# Patient Record
Sex: Female | Born: 1960 | Race: Black or African American | Hispanic: No | Marital: Single | Smoking: Never smoker
Health system: Southern US, Community
[De-identification: ages and names within clinical notes are randomized; demographics above are authoritative.]

## PROBLEM LIST (undated history)

## (undated) ENCOUNTER — Inpatient Hospital Stay: Admission: EM | Payer: Self-pay | Source: Home / Self Care

## (undated) DIAGNOSIS — G8929 Other chronic pain: Secondary | ICD-10-CM

## (undated) DIAGNOSIS — M1990 Unspecified osteoarthritis, unspecified site: Secondary | ICD-10-CM

## (undated) DIAGNOSIS — K5900 Constipation, unspecified: Secondary | ICD-10-CM

## (undated) DIAGNOSIS — I10 Essential (primary) hypertension: Secondary | ICD-10-CM

## (undated) DIAGNOSIS — M2550 Pain in unspecified joint: Secondary | ICD-10-CM

## (undated) DIAGNOSIS — Z8042 Family history of malignant neoplasm of prostate: Secondary | ICD-10-CM

## (undated) DIAGNOSIS — J45909 Unspecified asthma, uncomplicated: Secondary | ICD-10-CM

## (undated) DIAGNOSIS — M4800 Spinal stenosis, site unspecified: Secondary | ICD-10-CM

## (undated) DIAGNOSIS — M5481 Occipital neuralgia: Secondary | ICD-10-CM

## (undated) DIAGNOSIS — E7800 Pure hypercholesterolemia, unspecified: Secondary | ICD-10-CM

## (undated) DIAGNOSIS — G43909 Migraine, unspecified, not intractable, without status migrainosus: Secondary | ICD-10-CM

## (undated) DIAGNOSIS — M549 Dorsalgia, unspecified: Secondary | ICD-10-CM

## (undated) DIAGNOSIS — R519 Headache, unspecified: Secondary | ICD-10-CM

## (undated) DIAGNOSIS — R7303 Prediabetes: Secondary | ICD-10-CM

## (undated) DIAGNOSIS — T796XXA Traumatic ischemia of muscle, initial encounter: Secondary | ICD-10-CM

## (undated) DIAGNOSIS — N289 Disorder of kidney and ureter, unspecified: Secondary | ICD-10-CM

## (undated) DIAGNOSIS — G500 Trigeminal neuralgia: Secondary | ICD-10-CM

## (undated) DIAGNOSIS — Z803 Family history of malignant neoplasm of breast: Secondary | ICD-10-CM

## (undated) DIAGNOSIS — K589 Irritable bowel syndrome without diarrhea: Secondary | ICD-10-CM

## (undated) DIAGNOSIS — N200 Calculus of kidney: Secondary | ICD-10-CM

## (undated) DIAGNOSIS — R600 Localized edema: Secondary | ICD-10-CM

## (undated) DIAGNOSIS — Z87440 Personal history of urinary (tract) infections: Secondary | ICD-10-CM

## (undated) DIAGNOSIS — K219 Gastro-esophageal reflux disease without esophagitis: Secondary | ICD-10-CM

## (undated) DIAGNOSIS — Z87442 Personal history of urinary calculi: Secondary | ICD-10-CM

## (undated) DIAGNOSIS — F909 Attention-deficit hyperactivity disorder, unspecified type: Secondary | ICD-10-CM

## (undated) HISTORY — DX: Personal history of urinary (tract) infections: Z87.440

## (undated) HISTORY — DX: Occipital neuralgia: M54.81

## (undated) HISTORY — DX: Disorder of kidney and ureter, unspecified: N28.9

## (undated) HISTORY — DX: Headache, unspecified: R51.9

## (undated) HISTORY — DX: Migraine, unspecified, not intractable, without status migrainosus: G43.909

## (undated) HISTORY — DX: Localized edema: R60.0

## (undated) HISTORY — DX: Family history of malignant neoplasm of breast: Z80.3

## (undated) HISTORY — DX: Other chronic pain: G89.29

## (undated) HISTORY — DX: Unspecified osteoarthritis, unspecified site: M19.90

## (undated) HISTORY — DX: Family history of malignant neoplasm of prostate: Z80.42

## (undated) HISTORY — DX: Constipation, unspecified: K59.00

## (undated) HISTORY — DX: Pain in unspecified joint: M25.50

## (undated) HISTORY — PX: DILATION AND CURETTAGE OF UTERUS: SHX78

## (undated) HISTORY — DX: Gastro-esophageal reflux disease without esophagitis: K21.9

## (undated) HISTORY — DX: Attention-deficit hyperactivity disorder, unspecified type: F90.9

## (undated) HISTORY — PX: ABDOMINAL HYSTERECTOMY: SHX81

## (undated) HISTORY — PX: APPENDECTOMY: SHX54

## (undated) HISTORY — DX: Pure hypercholesterolemia, unspecified: E78.00

## (undated) HISTORY — DX: Dorsalgia, unspecified: M54.9

## (undated) HISTORY — PX: TUBAL LIGATION: SHX77

## (undated) HISTORY — DX: Essential (primary) hypertension: I10

## (undated) HISTORY — DX: Irritable bowel syndrome, unspecified: K58.9

## (undated) HISTORY — DX: Traumatic ischemia of muscle, initial encounter: T79.6XXA

## (undated) HISTORY — DX: Calculus of kidney: N20.0

## (undated) HISTORY — PX: BRAIN SURGERY: SHX531

## (undated) HISTORY — DX: Spinal stenosis, site unspecified: M48.00

## (undated) HISTORY — PX: JOINT REPLACEMENT: SHX530

## (undated) HISTORY — PX: BACK SURGERY: SHX140

## (undated) HISTORY — PX: COLONOSCOPY: SHX174

## (undated) HISTORY — PX: KIDNEY STONE SURGERY: SHX686

## (undated) HISTORY — PX: SPINE SURGERY: SHX786

## (undated) HISTORY — DX: Trigeminal neuralgia: G50.0

---

## 1999-08-09 DIAGNOSIS — M5030 Other cervical disc degeneration, unspecified cervical region: Secondary | ICD-10-CM | POA: Insufficient documentation

## 2008-08-08 DIAGNOSIS — M1990 Unspecified osteoarthritis, unspecified site: Secondary | ICD-10-CM

## 2008-08-08 HISTORY — DX: Unspecified osteoarthritis, unspecified site: M19.90

## 2009-04-14 DIAGNOSIS — E7800 Pure hypercholesterolemia, unspecified: Secondary | ICD-10-CM | POA: Insufficient documentation

## 2009-04-14 DIAGNOSIS — I10 Essential (primary) hypertension: Secondary | ICD-10-CM | POA: Insufficient documentation

## 2009-04-14 DIAGNOSIS — E785 Hyperlipidemia, unspecified: Secondary | ICD-10-CM | POA: Insufficient documentation

## 2009-04-14 DIAGNOSIS — J45909 Unspecified asthma, uncomplicated: Secondary | ICD-10-CM | POA: Insufficient documentation

## 2009-05-08 HISTORY — PX: ANKLE SURGERY: SHX546

## 2010-05-08 HISTORY — PX: OVARIAN CYST REMOVAL: SHX89

## 2010-08-08 DIAGNOSIS — N3010 Interstitial cystitis (chronic) without hematuria: Secondary | ICD-10-CM

## 2010-08-08 HISTORY — DX: Interstitial cystitis (chronic) without hematuria: N30.10

## 2011-08-25 DIAGNOSIS — G5600 Carpal tunnel syndrome, unspecified upper limb: Secondary | ICD-10-CM | POA: Insufficient documentation

## 2012-08-08 HISTORY — PX: CARPAL TUNNEL RELEASE: SHX101

## 2014-08-08 HISTORY — PX: OTHER SURGICAL HISTORY: SHX169

## 2014-08-12 DIAGNOSIS — M25561 Pain in right knee: Secondary | ICD-10-CM | POA: Diagnosis not present

## 2014-08-12 DIAGNOSIS — M1711 Unilateral primary osteoarthritis, right knee: Secondary | ICD-10-CM | POA: Diagnosis not present

## 2014-08-12 DIAGNOSIS — Z4789 Encounter for other orthopedic aftercare: Secondary | ICD-10-CM | POA: Diagnosis not present

## 2014-08-14 DIAGNOSIS — M1711 Unilateral primary osteoarthritis, right knee: Secondary | ICD-10-CM | POA: Diagnosis not present

## 2014-08-14 DIAGNOSIS — M25561 Pain in right knee: Secondary | ICD-10-CM | POA: Diagnosis not present

## 2014-08-14 DIAGNOSIS — Z4789 Encounter for other orthopedic aftercare: Secondary | ICD-10-CM | POA: Diagnosis not present

## 2014-08-15 DIAGNOSIS — M1711 Unilateral primary osteoarthritis, right knee: Secondary | ICD-10-CM | POA: Diagnosis not present

## 2014-08-15 DIAGNOSIS — Z4789 Encounter for other orthopedic aftercare: Secondary | ICD-10-CM | POA: Diagnosis not present

## 2014-08-15 DIAGNOSIS — M25561 Pain in right knee: Secondary | ICD-10-CM | POA: Diagnosis not present

## 2014-08-18 DIAGNOSIS — M25561 Pain in right knee: Secondary | ICD-10-CM | POA: Diagnosis not present

## 2014-08-18 DIAGNOSIS — M1711 Unilateral primary osteoarthritis, right knee: Secondary | ICD-10-CM | POA: Diagnosis not present

## 2014-08-18 DIAGNOSIS — Z4789 Encounter for other orthopedic aftercare: Secondary | ICD-10-CM | POA: Diagnosis not present

## 2014-08-21 DIAGNOSIS — Z4789 Encounter for other orthopedic aftercare: Secondary | ICD-10-CM | POA: Diagnosis not present

## 2014-08-21 DIAGNOSIS — M25561 Pain in right knee: Secondary | ICD-10-CM | POA: Diagnosis not present

## 2014-08-21 DIAGNOSIS — R2231 Localized swelling, mass and lump, right upper limb: Secondary | ICD-10-CM | POA: Diagnosis not present

## 2014-08-21 DIAGNOSIS — M1990 Unspecified osteoarthritis, unspecified site: Secondary | ICD-10-CM | POA: Diagnosis not present

## 2014-08-21 DIAGNOSIS — M1711 Unilateral primary osteoarthritis, right knee: Secondary | ICD-10-CM | POA: Diagnosis not present

## 2014-08-21 DIAGNOSIS — R634 Abnormal weight loss: Secondary | ICD-10-CM | POA: Diagnosis not present

## 2014-08-22 DIAGNOSIS — R2231 Localized swelling, mass and lump, right upper limb: Secondary | ICD-10-CM | POA: Diagnosis not present

## 2014-08-25 DIAGNOSIS — Z4789 Encounter for other orthopedic aftercare: Secondary | ICD-10-CM | POA: Diagnosis not present

## 2014-08-25 DIAGNOSIS — M1711 Unilateral primary osteoarthritis, right knee: Secondary | ICD-10-CM | POA: Diagnosis not present

## 2014-08-25 DIAGNOSIS — M25561 Pain in right knee: Secondary | ICD-10-CM | POA: Diagnosis not present

## 2014-08-26 DIAGNOSIS — M5481 Occipital neuralgia: Secondary | ICD-10-CM | POA: Diagnosis not present

## 2014-08-26 DIAGNOSIS — Z96651 Presence of right artificial knee joint: Secondary | ICD-10-CM | POA: Diagnosis not present

## 2014-08-26 DIAGNOSIS — R2231 Localized swelling, mass and lump, right upper limb: Secondary | ICD-10-CM | POA: Diagnosis not present

## 2014-08-26 DIAGNOSIS — Z79891 Long term (current) use of opiate analgesic: Secondary | ICD-10-CM | POA: Diagnosis not present

## 2014-08-26 DIAGNOSIS — M25551 Pain in right hip: Secondary | ICD-10-CM | POA: Diagnosis not present

## 2014-08-26 DIAGNOSIS — M25561 Pain in right knee: Secondary | ICD-10-CM | POA: Diagnosis not present

## 2014-08-26 DIAGNOSIS — I10 Essential (primary) hypertension: Secondary | ICD-10-CM | POA: Diagnosis not present

## 2014-08-26 DIAGNOSIS — G500 Trigeminal neuralgia: Secondary | ICD-10-CM | POA: Diagnosis not present

## 2014-08-26 DIAGNOSIS — M545 Low back pain: Secondary | ICD-10-CM | POA: Diagnosis not present

## 2014-08-27 DIAGNOSIS — M25561 Pain in right knee: Secondary | ICD-10-CM | POA: Diagnosis not present

## 2014-08-27 DIAGNOSIS — M1711 Unilateral primary osteoarthritis, right knee: Secondary | ICD-10-CM | POA: Diagnosis not present

## 2014-08-27 DIAGNOSIS — Z4789 Encounter for other orthopedic aftercare: Secondary | ICD-10-CM | POA: Diagnosis not present

## 2014-08-29 DIAGNOSIS — M25561 Pain in right knee: Secondary | ICD-10-CM | POA: Diagnosis not present

## 2014-08-29 DIAGNOSIS — M1711 Unilateral primary osteoarthritis, right knee: Secondary | ICD-10-CM | POA: Diagnosis not present

## 2014-08-29 DIAGNOSIS — Z4789 Encounter for other orthopedic aftercare: Secondary | ICD-10-CM | POA: Diagnosis not present

## 2014-09-02 DIAGNOSIS — M1711 Unilateral primary osteoarthritis, right knee: Secondary | ICD-10-CM | POA: Diagnosis not present

## 2014-09-02 DIAGNOSIS — Z4789 Encounter for other orthopedic aftercare: Secondary | ICD-10-CM | POA: Diagnosis not present

## 2014-09-02 DIAGNOSIS — M25561 Pain in right knee: Secondary | ICD-10-CM | POA: Diagnosis not present

## 2014-09-03 DIAGNOSIS — Z4789 Encounter for other orthopedic aftercare: Secondary | ICD-10-CM | POA: Diagnosis not present

## 2014-09-03 DIAGNOSIS — M1711 Unilateral primary osteoarthritis, right knee: Secondary | ICD-10-CM | POA: Diagnosis not present

## 2014-09-03 DIAGNOSIS — M25561 Pain in right knee: Secondary | ICD-10-CM | POA: Diagnosis not present

## 2014-09-05 DIAGNOSIS — Z4789 Encounter for other orthopedic aftercare: Secondary | ICD-10-CM | POA: Diagnosis not present

## 2014-09-05 DIAGNOSIS — M25561 Pain in right knee: Secondary | ICD-10-CM | POA: Diagnosis not present

## 2014-09-05 DIAGNOSIS — M1711 Unilateral primary osteoarthritis, right knee: Secondary | ICD-10-CM | POA: Diagnosis not present

## 2014-09-08 DIAGNOSIS — M25561 Pain in right knee: Secondary | ICD-10-CM | POA: Diagnosis not present

## 2014-09-08 DIAGNOSIS — Z4789 Encounter for other orthopedic aftercare: Secondary | ICD-10-CM | POA: Diagnosis not present

## 2014-09-08 DIAGNOSIS — M1711 Unilateral primary osteoarthritis, right knee: Secondary | ICD-10-CM | POA: Diagnosis not present

## 2014-09-09 DIAGNOSIS — Z96651 Presence of right artificial knee joint: Secondary | ICD-10-CM | POA: Diagnosis not present

## 2014-09-09 DIAGNOSIS — S83207D Unspecified tear of unspecified meniscus, current injury, left knee, subsequent encounter: Secondary | ICD-10-CM | POA: Diagnosis not present

## 2014-09-09 DIAGNOSIS — M25562 Pain in left knee: Secondary | ICD-10-CM | POA: Diagnosis not present

## 2014-09-09 DIAGNOSIS — M25561 Pain in right knee: Secondary | ICD-10-CM | POA: Diagnosis not present

## 2014-09-11 DIAGNOSIS — Z4789 Encounter for other orthopedic aftercare: Secondary | ICD-10-CM | POA: Diagnosis not present

## 2014-09-11 DIAGNOSIS — M1711 Unilateral primary osteoarthritis, right knee: Secondary | ICD-10-CM | POA: Diagnosis not present

## 2014-09-11 DIAGNOSIS — M25561 Pain in right knee: Secondary | ICD-10-CM | POA: Diagnosis not present

## 2014-09-12 DIAGNOSIS — Z79891 Long term (current) use of opiate analgesic: Secondary | ICD-10-CM | POA: Diagnosis not present

## 2014-09-12 DIAGNOSIS — M545 Low back pain: Secondary | ICD-10-CM | POA: Diagnosis not present

## 2014-09-12 DIAGNOSIS — Z4789 Encounter for other orthopedic aftercare: Secondary | ICD-10-CM | POA: Diagnosis not present

## 2014-09-12 DIAGNOSIS — M25561 Pain in right knee: Secondary | ICD-10-CM | POA: Diagnosis not present

## 2014-09-12 DIAGNOSIS — I10 Essential (primary) hypertension: Secondary | ICD-10-CM | POA: Diagnosis not present

## 2014-09-12 DIAGNOSIS — G500 Trigeminal neuralgia: Secondary | ICD-10-CM | POA: Diagnosis not present

## 2014-09-12 DIAGNOSIS — M1711 Unilateral primary osteoarthritis, right knee: Secondary | ICD-10-CM | POA: Diagnosis not present

## 2014-09-12 DIAGNOSIS — F4542 Pain disorder with related psychological factors: Secondary | ICD-10-CM | POA: Diagnosis not present

## 2014-09-12 DIAGNOSIS — Z96651 Presence of right artificial knee joint: Secondary | ICD-10-CM | POA: Diagnosis not present

## 2014-09-12 DIAGNOSIS — F330 Major depressive disorder, recurrent, mild: Secondary | ICD-10-CM | POA: Diagnosis not present

## 2014-09-12 DIAGNOSIS — M5481 Occipital neuralgia: Secondary | ICD-10-CM | POA: Diagnosis not present

## 2014-09-12 DIAGNOSIS — G894 Chronic pain syndrome: Secondary | ICD-10-CM | POA: Diagnosis not present

## 2014-09-12 DIAGNOSIS — M25551 Pain in right hip: Secondary | ICD-10-CM | POA: Diagnosis not present

## 2014-09-24 DIAGNOSIS — N3010 Interstitial cystitis (chronic) without hematuria: Secondary | ICD-10-CM | POA: Diagnosis not present

## 2014-09-24 DIAGNOSIS — G5601 Carpal tunnel syndrome, right upper limb: Secondary | ICD-10-CM | POA: Diagnosis not present

## 2014-09-24 DIAGNOSIS — Z96651 Presence of right artificial knee joint: Secondary | ICD-10-CM | POA: Diagnosis not present

## 2014-09-24 DIAGNOSIS — G5602 Carpal tunnel syndrome, left upper limb: Secondary | ICD-10-CM | POA: Diagnosis not present

## 2014-09-24 DIAGNOSIS — M545 Low back pain: Secondary | ICD-10-CM | POA: Diagnosis not present

## 2014-09-24 DIAGNOSIS — I10 Essential (primary) hypertension: Secondary | ICD-10-CM | POA: Diagnosis not present

## 2014-09-24 DIAGNOSIS — R51 Headache: Secondary | ICD-10-CM | POA: Diagnosis not present

## 2014-09-24 DIAGNOSIS — M25551 Pain in right hip: Secondary | ICD-10-CM | POA: Diagnosis not present

## 2014-09-24 DIAGNOSIS — M961 Postlaminectomy syndrome, not elsewhere classified: Secondary | ICD-10-CM | POA: Diagnosis not present

## 2014-09-24 DIAGNOSIS — G500 Trigeminal neuralgia: Secondary | ICD-10-CM | POA: Diagnosis not present

## 2014-10-07 DIAGNOSIS — R937 Abnormal findings on diagnostic imaging of other parts of musculoskeletal system: Secondary | ICD-10-CM | POA: Diagnosis not present

## 2014-10-07 DIAGNOSIS — R2231 Localized swelling, mass and lump, right upper limb: Secondary | ICD-10-CM | POA: Diagnosis not present

## 2014-10-10 DIAGNOSIS — R2231 Localized swelling, mass and lump, right upper limb: Secondary | ICD-10-CM | POA: Diagnosis not present

## 2014-10-23 DIAGNOSIS — M25551 Pain in right hip: Secondary | ICD-10-CM | POA: Diagnosis not present

## 2014-10-23 DIAGNOSIS — G5602 Carpal tunnel syndrome, left upper limb: Secondary | ICD-10-CM | POA: Diagnosis not present

## 2014-10-23 DIAGNOSIS — G500 Trigeminal neuralgia: Secondary | ICD-10-CM | POA: Diagnosis not present

## 2014-10-23 DIAGNOSIS — M25562 Pain in left knee: Secondary | ICD-10-CM | POA: Diagnosis not present

## 2014-10-23 DIAGNOSIS — Z96651 Presence of right artificial knee joint: Secondary | ICD-10-CM | POA: Diagnosis not present

## 2014-10-23 DIAGNOSIS — I10 Essential (primary) hypertension: Secondary | ICD-10-CM | POA: Diagnosis not present

## 2014-10-23 DIAGNOSIS — Z79891 Long term (current) use of opiate analgesic: Secondary | ICD-10-CM | POA: Diagnosis not present

## 2014-10-23 DIAGNOSIS — M25561 Pain in right knee: Secondary | ICD-10-CM | POA: Diagnosis not present

## 2014-10-23 DIAGNOSIS — M961 Postlaminectomy syndrome, not elsewhere classified: Secondary | ICD-10-CM | POA: Diagnosis not present

## 2014-10-23 DIAGNOSIS — M5441 Lumbago with sciatica, right side: Secondary | ICD-10-CM | POA: Diagnosis not present

## 2014-10-23 DIAGNOSIS — G5601 Carpal tunnel syndrome, right upper limb: Secondary | ICD-10-CM | POA: Diagnosis not present

## 2014-10-23 DIAGNOSIS — N3010 Interstitial cystitis (chronic) without hematuria: Secondary | ICD-10-CM | POA: Diagnosis not present

## 2014-10-23 DIAGNOSIS — M545 Low back pain: Secondary | ICD-10-CM | POA: Diagnosis not present

## 2014-10-28 DIAGNOSIS — N390 Urinary tract infection, site not specified: Secondary | ICD-10-CM | POA: Diagnosis not present

## 2014-10-28 DIAGNOSIS — N3000 Acute cystitis without hematuria: Secondary | ICD-10-CM | POA: Diagnosis not present

## 2014-11-11 DIAGNOSIS — Z96651 Presence of right artificial knee joint: Secondary | ICD-10-CM | POA: Diagnosis not present

## 2014-11-11 DIAGNOSIS — M25461 Effusion, right knee: Secondary | ICD-10-CM | POA: Diagnosis not present

## 2014-11-11 DIAGNOSIS — Z471 Aftercare following joint replacement surgery: Secondary | ICD-10-CM | POA: Diagnosis not present

## 2014-11-11 DIAGNOSIS — M25562 Pain in left knee: Secondary | ICD-10-CM | POA: Diagnosis not present

## 2014-11-11 DIAGNOSIS — M25551 Pain in right hip: Secondary | ICD-10-CM | POA: Diagnosis not present

## 2014-11-11 DIAGNOSIS — M25552 Pain in left hip: Secondary | ICD-10-CM | POA: Diagnosis not present

## 2014-11-11 DIAGNOSIS — S83207D Unspecified tear of unspecified meniscus, current injury, left knee, subsequent encounter: Secondary | ICD-10-CM | POA: Diagnosis not present

## 2014-11-11 DIAGNOSIS — M25561 Pain in right knee: Secondary | ICD-10-CM | POA: Diagnosis not present

## 2014-11-12 DIAGNOSIS — M25562 Pain in left knee: Secondary | ICD-10-CM | POA: Diagnosis not present

## 2014-11-12 DIAGNOSIS — M1712 Unilateral primary osteoarthritis, left knee: Secondary | ICD-10-CM | POA: Diagnosis not present

## 2014-11-20 DIAGNOSIS — E782 Mixed hyperlipidemia: Secondary | ICD-10-CM | POA: Diagnosis not present

## 2014-11-20 DIAGNOSIS — I10 Essential (primary) hypertension: Secondary | ICD-10-CM | POA: Diagnosis not present

## 2014-11-20 DIAGNOSIS — E1165 Type 2 diabetes mellitus with hyperglycemia: Secondary | ICD-10-CM | POA: Diagnosis not present

## 2014-11-20 DIAGNOSIS — E672 Megavitamin-B6 syndrome: Secondary | ICD-10-CM | POA: Diagnosis not present

## 2014-11-20 DIAGNOSIS — K219 Gastro-esophageal reflux disease without esophagitis: Secondary | ICD-10-CM | POA: Diagnosis not present

## 2014-11-20 DIAGNOSIS — N390 Urinary tract infection, site not specified: Secondary | ICD-10-CM | POA: Diagnosis not present

## 2014-11-20 DIAGNOSIS — Z0000 Encounter for general adult medical examination without abnormal findings: Secondary | ICD-10-CM | POA: Diagnosis not present

## 2014-11-20 DIAGNOSIS — E785 Hyperlipidemia, unspecified: Secondary | ICD-10-CM | POA: Diagnosis not present

## 2014-11-27 DIAGNOSIS — M25562 Pain in left knee: Secondary | ICD-10-CM | POA: Diagnosis not present

## 2014-11-27 DIAGNOSIS — Z96651 Presence of right artificial knee joint: Secondary | ICD-10-CM | POA: Diagnosis not present

## 2014-11-27 DIAGNOSIS — M25561 Pain in right knee: Secondary | ICD-10-CM | POA: Diagnosis not present

## 2014-11-27 DIAGNOSIS — M1712 Unilateral primary osteoarthritis, left knee: Secondary | ICD-10-CM | POA: Diagnosis not present

## 2014-12-23 DIAGNOSIS — I10 Essential (primary) hypertension: Secondary | ICD-10-CM | POA: Diagnosis not present

## 2014-12-23 DIAGNOSIS — Z01411 Encounter for gynecological examination (general) (routine) with abnormal findings: Secondary | ICD-10-CM | POA: Diagnosis not present

## 2014-12-23 DIAGNOSIS — N959 Unspecified menopausal and perimenopausal disorder: Secondary | ICD-10-CM | POA: Diagnosis not present

## 2014-12-23 DIAGNOSIS — Z1239 Encounter for other screening for malignant neoplasm of breast: Secondary | ICD-10-CM | POA: Diagnosis not present

## 2014-12-25 DIAGNOSIS — J029 Acute pharyngitis, unspecified: Secondary | ICD-10-CM | POA: Diagnosis not present

## 2014-12-25 DIAGNOSIS — H6690 Otitis media, unspecified, unspecified ear: Secondary | ICD-10-CM | POA: Diagnosis not present

## 2014-12-25 DIAGNOSIS — E785 Hyperlipidemia, unspecified: Secondary | ICD-10-CM | POA: Diagnosis not present

## 2014-12-30 DIAGNOSIS — Z01411 Encounter for gynecological examination (general) (routine) with abnormal findings: Secondary | ICD-10-CM | POA: Diagnosis not present

## 2014-12-30 DIAGNOSIS — Z1239 Encounter for other screening for malignant neoplasm of breast: Secondary | ICD-10-CM | POA: Diagnosis not present

## 2014-12-30 DIAGNOSIS — I10 Essential (primary) hypertension: Secondary | ICD-10-CM | POA: Diagnosis not present

## 2014-12-30 DIAGNOSIS — N959 Unspecified menopausal and perimenopausal disorder: Secondary | ICD-10-CM | POA: Diagnosis not present

## 2014-12-31 DIAGNOSIS — M25562 Pain in left knee: Secondary | ICD-10-CM | POA: Diagnosis not present

## 2014-12-31 DIAGNOSIS — M1712 Unilateral primary osteoarthritis, left knee: Secondary | ICD-10-CM | POA: Diagnosis not present

## 2014-12-31 DIAGNOSIS — Z96651 Presence of right artificial knee joint: Secondary | ICD-10-CM | POA: Diagnosis not present

## 2014-12-31 DIAGNOSIS — Z9079 Acquired absence of other genital organ(s): Secondary | ICD-10-CM | POA: Diagnosis not present

## 2014-12-31 DIAGNOSIS — Z1272 Encounter for screening for malignant neoplasm of vagina: Secondary | ICD-10-CM | POA: Diagnosis not present

## 2014-12-31 DIAGNOSIS — M25561 Pain in right knee: Secondary | ICD-10-CM | POA: Diagnosis not present

## 2015-01-27 DIAGNOSIS — M5481 Occipital neuralgia: Secondary | ICD-10-CM | POA: Diagnosis not present

## 2015-01-27 DIAGNOSIS — G500 Trigeminal neuralgia: Secondary | ICD-10-CM | POA: Diagnosis not present

## 2015-01-27 DIAGNOSIS — M961 Postlaminectomy syndrome, not elsewhere classified: Secondary | ICD-10-CM | POA: Diagnosis not present

## 2015-01-27 DIAGNOSIS — M25551 Pain in right hip: Secondary | ICD-10-CM | POA: Diagnosis not present

## 2015-01-27 DIAGNOSIS — G5602 Carpal tunnel syndrome, left upper limb: Secondary | ICD-10-CM | POA: Diagnosis not present

## 2015-01-27 DIAGNOSIS — Z79891 Long term (current) use of opiate analgesic: Secondary | ICD-10-CM | POA: Diagnosis not present

## 2015-01-27 DIAGNOSIS — I10 Essential (primary) hypertension: Secondary | ICD-10-CM | POA: Diagnosis not present

## 2015-01-27 DIAGNOSIS — G5601 Carpal tunnel syndrome, right upper limb: Secondary | ICD-10-CM | POA: Diagnosis not present

## 2015-01-27 DIAGNOSIS — Z96651 Presence of right artificial knee joint: Secondary | ICD-10-CM | POA: Diagnosis not present

## 2015-01-27 DIAGNOSIS — M25562 Pain in left knee: Secondary | ICD-10-CM | POA: Diagnosis not present

## 2015-01-27 DIAGNOSIS — N3010 Interstitial cystitis (chronic) without hematuria: Secondary | ICD-10-CM | POA: Diagnosis not present

## 2015-01-27 DIAGNOSIS — M25561 Pain in right knee: Secondary | ICD-10-CM | POA: Diagnosis not present

## 2015-01-28 DIAGNOSIS — G5602 Carpal tunnel syndrome, left upper limb: Secondary | ICD-10-CM | POA: Diagnosis not present

## 2015-01-28 DIAGNOSIS — F4542 Pain disorder with related psychological factors: Secondary | ICD-10-CM | POA: Diagnosis not present

## 2015-01-28 DIAGNOSIS — N3010 Interstitial cystitis (chronic) without hematuria: Secondary | ICD-10-CM | POA: Diagnosis not present

## 2015-01-28 DIAGNOSIS — Z79891 Long term (current) use of opiate analgesic: Secondary | ICD-10-CM | POA: Diagnosis not present

## 2015-01-28 DIAGNOSIS — G500 Trigeminal neuralgia: Secondary | ICD-10-CM | POA: Diagnosis not present

## 2015-01-28 DIAGNOSIS — M5481 Occipital neuralgia: Secondary | ICD-10-CM | POA: Diagnosis not present

## 2015-01-28 DIAGNOSIS — M25561 Pain in right knee: Secondary | ICD-10-CM | POA: Diagnosis not present

## 2015-01-28 DIAGNOSIS — F331 Major depressive disorder, recurrent, moderate: Secondary | ICD-10-CM | POA: Diagnosis not present

## 2015-01-28 DIAGNOSIS — M961 Postlaminectomy syndrome, not elsewhere classified: Secondary | ICD-10-CM | POA: Diagnosis not present

## 2015-01-28 DIAGNOSIS — M25551 Pain in right hip: Secondary | ICD-10-CM | POA: Diagnosis not present

## 2015-01-28 DIAGNOSIS — I10 Essential (primary) hypertension: Secondary | ICD-10-CM | POA: Diagnosis not present

## 2015-01-28 DIAGNOSIS — Z96651 Presence of right artificial knee joint: Secondary | ICD-10-CM | POA: Diagnosis not present

## 2015-01-28 DIAGNOSIS — M25562 Pain in left knee: Secondary | ICD-10-CM | POA: Diagnosis not present

## 2015-01-28 DIAGNOSIS — G894 Chronic pain syndrome: Secondary | ICD-10-CM | POA: Diagnosis not present

## 2015-01-28 DIAGNOSIS — G5601 Carpal tunnel syndrome, right upper limb: Secondary | ICD-10-CM | POA: Diagnosis not present

## 2015-02-03 DIAGNOSIS — M25551 Pain in right hip: Secondary | ICD-10-CM | POA: Diagnosis not present

## 2015-02-03 DIAGNOSIS — M25561 Pain in right knee: Secondary | ICD-10-CM | POA: Diagnosis not present

## 2015-02-10 DIAGNOSIS — M25661 Stiffness of right knee, not elsewhere classified: Secondary | ICD-10-CM | POA: Diagnosis not present

## 2015-02-10 DIAGNOSIS — K219 Gastro-esophageal reflux disease without esophagitis: Secondary | ICD-10-CM | POA: Diagnosis not present

## 2015-02-10 DIAGNOSIS — M25561 Pain in right knee: Secondary | ICD-10-CM | POA: Diagnosis not present

## 2015-02-10 DIAGNOSIS — E782 Mixed hyperlipidemia: Secondary | ICD-10-CM | POA: Diagnosis not present

## 2015-02-10 DIAGNOSIS — G8929 Other chronic pain: Secondary | ICD-10-CM | POA: Diagnosis not present

## 2015-02-10 DIAGNOSIS — Z96651 Presence of right artificial knee joint: Secondary | ICD-10-CM | POA: Diagnosis not present

## 2015-02-10 DIAGNOSIS — Z01818 Encounter for other preprocedural examination: Secondary | ICD-10-CM | POA: Diagnosis not present

## 2015-02-12 DIAGNOSIS — M25561 Pain in right knee: Secondary | ICD-10-CM | POA: Diagnosis not present

## 2015-02-12 DIAGNOSIS — M25469 Effusion, unspecified knee: Secondary | ICD-10-CM | POA: Diagnosis not present

## 2015-02-12 DIAGNOSIS — Z96659 Presence of unspecified artificial knee joint: Secondary | ICD-10-CM | POA: Diagnosis not present

## 2015-02-12 DIAGNOSIS — M2540 Effusion, unspecified joint: Secondary | ICD-10-CM | POA: Diagnosis not present

## 2015-02-12 DIAGNOSIS — M25569 Pain in unspecified knee: Secondary | ICD-10-CM | POA: Diagnosis not present

## 2015-02-13 DIAGNOSIS — Z96659 Presence of unspecified artificial knee joint: Secondary | ICD-10-CM | POA: Diagnosis not present

## 2015-02-19 DIAGNOSIS — Z96651 Presence of right artificial knee joint: Secondary | ICD-10-CM | POA: Diagnosis not present

## 2015-02-19 DIAGNOSIS — R948 Abnormal results of function studies of other organs and systems: Secondary | ICD-10-CM | POA: Diagnosis not present

## 2015-02-26 DIAGNOSIS — Y792 Prosthetic and other implants, materials and accessory orthopedic devices associated with adverse incidents: Secondary | ICD-10-CM | POA: Diagnosis not present

## 2015-02-26 DIAGNOSIS — M25561 Pain in right knee: Secondary | ICD-10-CM | POA: Diagnosis not present

## 2015-02-27 DIAGNOSIS — Z96651 Presence of right artificial knee joint: Secondary | ICD-10-CM | POA: Diagnosis not present

## 2015-02-27 DIAGNOSIS — G8929 Other chronic pain: Secondary | ICD-10-CM | POA: Diagnosis not present

## 2015-02-27 DIAGNOSIS — M25561 Pain in right knee: Secondary | ICD-10-CM | POA: Diagnosis not present

## 2015-02-27 DIAGNOSIS — M25562 Pain in left knee: Secondary | ICD-10-CM | POA: Diagnosis not present

## 2015-02-27 DIAGNOSIS — F330 Major depressive disorder, recurrent, mild: Secondary | ICD-10-CM | POA: Diagnosis not present

## 2015-03-14 DIAGNOSIS — H25013 Cortical age-related cataract, bilateral: Secondary | ICD-10-CM | POA: Diagnosis not present

## 2015-04-01 DIAGNOSIS — G8929 Other chronic pain: Secondary | ICD-10-CM | POA: Diagnosis not present

## 2015-04-01 DIAGNOSIS — M25551 Pain in right hip: Secondary | ICD-10-CM | POA: Diagnosis not present

## 2015-04-01 DIAGNOSIS — M25569 Pain in unspecified knee: Secondary | ICD-10-CM | POA: Diagnosis not present

## 2015-04-01 DIAGNOSIS — M25552 Pain in left hip: Secondary | ICD-10-CM | POA: Diagnosis not present

## 2015-04-01 DIAGNOSIS — Z96651 Presence of right artificial knee joint: Secondary | ICD-10-CM | POA: Diagnosis not present

## 2015-04-01 DIAGNOSIS — F330 Major depressive disorder, recurrent, mild: Secondary | ICD-10-CM | POA: Diagnosis not present

## 2015-04-01 DIAGNOSIS — Z79891 Long term (current) use of opiate analgesic: Secondary | ICD-10-CM | POA: Diagnosis not present

## 2015-04-01 DIAGNOSIS — F4542 Pain disorder with related psychological factors: Secondary | ICD-10-CM | POA: Diagnosis not present

## 2015-04-01 DIAGNOSIS — M25561 Pain in right knee: Secondary | ICD-10-CM | POA: Diagnosis not present

## 2015-04-01 DIAGNOSIS — M961 Postlaminectomy syndrome, not elsewhere classified: Secondary | ICD-10-CM | POA: Diagnosis not present

## 2015-04-01 DIAGNOSIS — M545 Low back pain: Secondary | ICD-10-CM | POA: Diagnosis not present

## 2015-04-01 DIAGNOSIS — G500 Trigeminal neuralgia: Secondary | ICD-10-CM | POA: Diagnosis not present

## 2015-04-01 DIAGNOSIS — M25562 Pain in left knee: Secondary | ICD-10-CM | POA: Diagnosis not present

## 2015-04-01 DIAGNOSIS — M797 Fibromyalgia: Secondary | ICD-10-CM | POA: Diagnosis not present

## 2015-04-07 DIAGNOSIS — R51 Headache: Secondary | ICD-10-CM | POA: Diagnosis not present

## 2015-04-07 DIAGNOSIS — M5481 Occipital neuralgia: Secondary | ICD-10-CM | POA: Diagnosis not present

## 2015-04-08 DIAGNOSIS — Z96651 Presence of right artificial knee joint: Secondary | ICD-10-CM | POA: Diagnosis not present

## 2015-04-08 DIAGNOSIS — M545 Low back pain: Secondary | ICD-10-CM | POA: Diagnosis not present

## 2015-04-08 DIAGNOSIS — G500 Trigeminal neuralgia: Secondary | ICD-10-CM | POA: Diagnosis not present

## 2015-04-08 DIAGNOSIS — M961 Postlaminectomy syndrome, not elsewhere classified: Secondary | ICD-10-CM | POA: Diagnosis not present

## 2015-04-08 DIAGNOSIS — M25562 Pain in left knee: Secondary | ICD-10-CM | POA: Diagnosis not present

## 2015-04-08 DIAGNOSIS — M797 Fibromyalgia: Secondary | ICD-10-CM | POA: Diagnosis not present

## 2015-04-08 DIAGNOSIS — F4542 Pain disorder with related psychological factors: Secondary | ICD-10-CM | POA: Diagnosis not present

## 2015-04-08 DIAGNOSIS — G8929 Other chronic pain: Secondary | ICD-10-CM | POA: Diagnosis not present

## 2015-04-08 DIAGNOSIS — M25552 Pain in left hip: Secondary | ICD-10-CM | POA: Diagnosis not present

## 2015-04-08 DIAGNOSIS — F330 Major depressive disorder, recurrent, mild: Secondary | ICD-10-CM | POA: Diagnosis not present

## 2015-04-08 DIAGNOSIS — M25551 Pain in right hip: Secondary | ICD-10-CM | POA: Diagnosis not present

## 2015-04-08 DIAGNOSIS — M25561 Pain in right knee: Secondary | ICD-10-CM | POA: Diagnosis not present

## 2015-04-08 DIAGNOSIS — Z79891 Long term (current) use of opiate analgesic: Secondary | ICD-10-CM | POA: Diagnosis not present

## 2015-04-10 DIAGNOSIS — M5481 Occipital neuralgia: Secondary | ICD-10-CM | POA: Diagnosis not present

## 2015-04-10 DIAGNOSIS — R51 Headache: Secondary | ICD-10-CM | POA: Diagnosis not present

## 2015-04-10 DIAGNOSIS — G500 Trigeminal neuralgia: Secondary | ICD-10-CM | POA: Diagnosis not present

## 2015-04-14 DIAGNOSIS — G500 Trigeminal neuralgia: Secondary | ICD-10-CM | POA: Diagnosis not present

## 2015-04-14 DIAGNOSIS — Z01818 Encounter for other preprocedural examination: Secondary | ICD-10-CM | POA: Diagnosis not present

## 2015-04-16 DIAGNOSIS — M25561 Pain in right knee: Secondary | ICD-10-CM | POA: Diagnosis not present

## 2015-04-17 DIAGNOSIS — M5481 Occipital neuralgia: Secondary | ICD-10-CM | POA: Diagnosis not present

## 2015-04-17 DIAGNOSIS — G43709 Chronic migraine without aura, not intractable, without status migrainosus: Secondary | ICD-10-CM | POA: Diagnosis not present

## 2015-04-23 DIAGNOSIS — Z01818 Encounter for other preprocedural examination: Secondary | ICD-10-CM | POA: Diagnosis not present

## 2015-04-28 DIAGNOSIS — E785 Hyperlipidemia, unspecified: Secondary | ICD-10-CM | POA: Diagnosis not present

## 2015-04-28 DIAGNOSIS — K219 Gastro-esophageal reflux disease without esophagitis: Secondary | ICD-10-CM | POA: Diagnosis not present

## 2015-04-28 DIAGNOSIS — M797 Fibromyalgia: Secondary | ICD-10-CM | POA: Diagnosis present

## 2015-04-28 DIAGNOSIS — J45909 Unspecified asthma, uncomplicated: Secondary | ICD-10-CM | POA: Diagnosis present

## 2015-04-28 DIAGNOSIS — T8484XA Pain due to internal orthopedic prosthetic devices, implants and grafts, initial encounter: Secondary | ICD-10-CM | POA: Diagnosis present

## 2015-04-28 DIAGNOSIS — T84092A Other mechanical complication of internal right knee prosthesis, initial encounter: Secondary | ICD-10-CM | POA: Diagnosis not present

## 2015-04-28 DIAGNOSIS — Z981 Arthrodesis status: Secondary | ICD-10-CM | POA: Diagnosis not present

## 2015-04-28 DIAGNOSIS — Z471 Aftercare following joint replacement surgery: Secondary | ICD-10-CM | POA: Diagnosis not present

## 2015-04-28 DIAGNOSIS — T849XXA Unspecified complication of internal orthopedic prosthetic device, implant and graft, initial encounter: Secondary | ICD-10-CM | POA: Diagnosis not present

## 2015-04-28 DIAGNOSIS — I10 Essential (primary) hypertension: Secondary | ICD-10-CM | POA: Diagnosis not present

## 2015-04-28 DIAGNOSIS — Z96651 Presence of right artificial knee joint: Secondary | ICD-10-CM | POA: Diagnosis not present

## 2015-04-28 DIAGNOSIS — M1710 Unilateral primary osteoarthritis, unspecified knee: Secondary | ICD-10-CM | POA: Diagnosis not present

## 2015-05-01 DIAGNOSIS — R5381 Other malaise: Secondary | ICD-10-CM | POA: Diagnosis not present

## 2015-05-01 DIAGNOSIS — G8918 Other acute postprocedural pain: Secondary | ICD-10-CM | POA: Diagnosis not present

## 2015-05-01 DIAGNOSIS — Z96651 Presence of right artificial knee joint: Secondary | ICD-10-CM | POA: Diagnosis not present

## 2015-05-01 DIAGNOSIS — Z7409 Other reduced mobility: Secondary | ICD-10-CM | POA: Diagnosis not present

## 2015-05-04 DIAGNOSIS — Z471 Aftercare following joint replacement surgery: Secondary | ICD-10-CM | POA: Diagnosis not present

## 2015-05-04 DIAGNOSIS — R5381 Other malaise: Secondary | ICD-10-CM | POA: Diagnosis not present

## 2015-05-04 DIAGNOSIS — Z96651 Presence of right artificial knee joint: Secondary | ICD-10-CM | POA: Diagnosis not present

## 2015-05-04 DIAGNOSIS — Z7409 Other reduced mobility: Secondary | ICD-10-CM | POA: Diagnosis not present

## 2015-05-04 DIAGNOSIS — G8918 Other acute postprocedural pain: Secondary | ICD-10-CM | POA: Diagnosis not present

## 2015-05-04 DIAGNOSIS — Z0389 Encounter for observation for other suspected diseases and conditions ruled out: Secondary | ICD-10-CM | POA: Diagnosis not present

## 2015-05-05 DIAGNOSIS — Z7409 Other reduced mobility: Secondary | ICD-10-CM | POA: Diagnosis not present

## 2015-05-05 DIAGNOSIS — Z96651 Presence of right artificial knee joint: Secondary | ICD-10-CM | POA: Diagnosis not present

## 2015-05-05 DIAGNOSIS — Z751 Person awaiting admission to adequate facility elsewhere: Secondary | ICD-10-CM | POA: Diagnosis not present

## 2015-05-05 DIAGNOSIS — G8918 Other acute postprocedural pain: Secondary | ICD-10-CM | POA: Diagnosis not present

## 2015-05-05 DIAGNOSIS — R5381 Other malaise: Secondary | ICD-10-CM | POA: Diagnosis not present

## 2015-05-06 DIAGNOSIS — Z7409 Other reduced mobility: Secondary | ICD-10-CM | POA: Diagnosis not present

## 2015-05-06 DIAGNOSIS — Z96651 Presence of right artificial knee joint: Secondary | ICD-10-CM | POA: Diagnosis not present

## 2015-05-06 DIAGNOSIS — G8918 Other acute postprocedural pain: Secondary | ICD-10-CM | POA: Diagnosis not present

## 2015-05-06 DIAGNOSIS — R5381 Other malaise: Secondary | ICD-10-CM | POA: Diagnosis not present

## 2015-05-07 DIAGNOSIS — G8918 Other acute postprocedural pain: Secondary | ICD-10-CM | POA: Diagnosis not present

## 2015-05-07 DIAGNOSIS — Z96651 Presence of right artificial knee joint: Secondary | ICD-10-CM | POA: Diagnosis not present

## 2015-05-07 DIAGNOSIS — Z7409 Other reduced mobility: Secondary | ICD-10-CM | POA: Diagnosis not present

## 2015-05-07 DIAGNOSIS — R5381 Other malaise: Secondary | ICD-10-CM | POA: Diagnosis not present

## 2015-05-08 DIAGNOSIS — R5381 Other malaise: Secondary | ICD-10-CM | POA: Diagnosis not present

## 2015-05-08 DIAGNOSIS — Z96651 Presence of right artificial knee joint: Secondary | ICD-10-CM | POA: Diagnosis not present

## 2015-05-08 DIAGNOSIS — G8918 Other acute postprocedural pain: Secondary | ICD-10-CM | POA: Diagnosis not present

## 2015-05-08 DIAGNOSIS — Z0389 Encounter for observation for other suspected diseases and conditions ruled out: Secondary | ICD-10-CM | POA: Diagnosis not present

## 2015-05-08 DIAGNOSIS — Z7409 Other reduced mobility: Secondary | ICD-10-CM | POA: Diagnosis not present

## 2015-05-11 DIAGNOSIS — Z96651 Presence of right artificial knee joint: Secondary | ICD-10-CM | POA: Diagnosis not present

## 2015-05-13 DIAGNOSIS — M1711 Unilateral primary osteoarthritis, right knee: Secondary | ICD-10-CM | POA: Diagnosis not present

## 2015-05-13 DIAGNOSIS — M25561 Pain in right knee: Secondary | ICD-10-CM | POA: Diagnosis not present

## 2015-05-13 DIAGNOSIS — M6281 Muscle weakness (generalized): Secondary | ICD-10-CM | POA: Diagnosis not present

## 2015-05-14 DIAGNOSIS — M25561 Pain in right knee: Secondary | ICD-10-CM | POA: Diagnosis not present

## 2015-05-14 DIAGNOSIS — M6281 Muscle weakness (generalized): Secondary | ICD-10-CM | POA: Diagnosis not present

## 2015-05-18 DIAGNOSIS — M25561 Pain in right knee: Secondary | ICD-10-CM | POA: Diagnosis not present

## 2015-05-20 DIAGNOSIS — M6281 Muscle weakness (generalized): Secondary | ICD-10-CM | POA: Diagnosis not present

## 2015-05-20 DIAGNOSIS — M25561 Pain in right knee: Secondary | ICD-10-CM | POA: Diagnosis not present

## 2015-05-20 DIAGNOSIS — M1711 Unilateral primary osteoarthritis, right knee: Secondary | ICD-10-CM | POA: Diagnosis not present

## 2015-05-22 DIAGNOSIS — Z96651 Presence of right artificial knee joint: Secondary | ICD-10-CM | POA: Diagnosis not present

## 2015-05-22 DIAGNOSIS — R600 Localized edema: Secondary | ICD-10-CM | POA: Diagnosis not present

## 2015-05-22 DIAGNOSIS — M2560 Stiffness of unspecified joint, not elsewhere classified: Secondary | ICD-10-CM | POA: Diagnosis not present

## 2015-05-22 DIAGNOSIS — M6281 Muscle weakness (generalized): Secondary | ICD-10-CM | POA: Diagnosis not present

## 2015-05-22 DIAGNOSIS — M25561 Pain in right knee: Secondary | ICD-10-CM | POA: Diagnosis not present

## 2015-05-25 DIAGNOSIS — M6281 Muscle weakness (generalized): Secondary | ICD-10-CM | POA: Diagnosis not present

## 2015-05-25 DIAGNOSIS — R600 Localized edema: Secondary | ICD-10-CM | POA: Diagnosis not present

## 2015-05-25 DIAGNOSIS — M2560 Stiffness of unspecified joint, not elsewhere classified: Secondary | ICD-10-CM | POA: Diagnosis not present

## 2015-05-25 DIAGNOSIS — Z96651 Presence of right artificial knee joint: Secondary | ICD-10-CM | POA: Diagnosis not present

## 2015-05-27 DIAGNOSIS — Z96652 Presence of left artificial knee joint: Secondary | ICD-10-CM | POA: Diagnosis not present

## 2015-05-28 DIAGNOSIS — Z96659 Presence of unspecified artificial knee joint: Secondary | ICD-10-CM | POA: Diagnosis not present

## 2015-05-28 DIAGNOSIS — M545 Low back pain: Secondary | ICD-10-CM | POA: Diagnosis not present

## 2015-05-28 DIAGNOSIS — M6281 Muscle weakness (generalized): Secondary | ICD-10-CM | POA: Diagnosis not present

## 2015-05-28 DIAGNOSIS — M1711 Unilateral primary osteoarthritis, right knee: Secondary | ICD-10-CM | POA: Diagnosis not present

## 2015-05-28 DIAGNOSIS — R600 Localized edema: Secondary | ICD-10-CM | POA: Diagnosis not present

## 2015-05-28 DIAGNOSIS — G500 Trigeminal neuralgia: Secondary | ICD-10-CM | POA: Diagnosis not present

## 2015-05-28 DIAGNOSIS — Z79891 Long term (current) use of opiate analgesic: Secondary | ICD-10-CM | POA: Diagnosis not present

## 2015-05-28 DIAGNOSIS — M25561 Pain in right knee: Secondary | ICD-10-CM | POA: Diagnosis not present

## 2015-05-28 DIAGNOSIS — N3010 Interstitial cystitis (chronic) without hematuria: Secondary | ICD-10-CM | POA: Diagnosis not present

## 2015-05-28 DIAGNOSIS — Z96651 Presence of right artificial knee joint: Secondary | ICD-10-CM | POA: Diagnosis not present

## 2015-05-28 DIAGNOSIS — M961 Postlaminectomy syndrome, not elsewhere classified: Secondary | ICD-10-CM | POA: Diagnosis not present

## 2015-05-28 DIAGNOSIS — M25551 Pain in right hip: Secondary | ICD-10-CM | POA: Diagnosis not present

## 2015-06-01 DIAGNOSIS — M25561 Pain in right knee: Secondary | ICD-10-CM | POA: Diagnosis not present

## 2015-06-01 DIAGNOSIS — Z96651 Presence of right artificial knee joint: Secondary | ICD-10-CM | POA: Diagnosis not present

## 2015-06-01 DIAGNOSIS — M2560 Stiffness of unspecified joint, not elsewhere classified: Secondary | ICD-10-CM | POA: Diagnosis not present

## 2015-06-01 DIAGNOSIS — M6281 Muscle weakness (generalized): Secondary | ICD-10-CM | POA: Diagnosis not present

## 2015-06-03 DIAGNOSIS — Z96651 Presence of right artificial knee joint: Secondary | ICD-10-CM | POA: Diagnosis not present

## 2015-06-03 DIAGNOSIS — R600 Localized edema: Secondary | ICD-10-CM | POA: Diagnosis not present

## 2015-06-03 DIAGNOSIS — M6281 Muscle weakness (generalized): Secondary | ICD-10-CM | POA: Diagnosis not present

## 2015-06-03 DIAGNOSIS — M2560 Stiffness of unspecified joint, not elsewhere classified: Secondary | ICD-10-CM | POA: Diagnosis not present

## 2015-06-05 DIAGNOSIS — Z96651 Presence of right artificial knee joint: Secondary | ICD-10-CM | POA: Diagnosis not present

## 2015-06-05 DIAGNOSIS — M6281 Muscle weakness (generalized): Secondary | ICD-10-CM | POA: Diagnosis not present

## 2015-06-05 DIAGNOSIS — M2560 Stiffness of unspecified joint, not elsewhere classified: Secondary | ICD-10-CM | POA: Diagnosis not present

## 2015-06-05 DIAGNOSIS — R600 Localized edema: Secondary | ICD-10-CM | POA: Diagnosis not present

## 2015-06-08 DIAGNOSIS — F4542 Pain disorder with related psychological factors: Secondary | ICD-10-CM | POA: Diagnosis not present

## 2015-06-08 DIAGNOSIS — Z79891 Long term (current) use of opiate analgesic: Secondary | ICD-10-CM | POA: Diagnosis not present

## 2015-06-08 DIAGNOSIS — N3010 Interstitial cystitis (chronic) without hematuria: Secondary | ICD-10-CM | POA: Diagnosis not present

## 2015-06-08 DIAGNOSIS — M961 Postlaminectomy syndrome, not elsewhere classified: Secondary | ICD-10-CM | POA: Diagnosis not present

## 2015-06-08 DIAGNOSIS — G500 Trigeminal neuralgia: Secondary | ICD-10-CM | POA: Diagnosis not present

## 2015-06-08 DIAGNOSIS — I10 Essential (primary) hypertension: Secondary | ICD-10-CM | POA: Diagnosis not present

## 2015-06-08 DIAGNOSIS — M25561 Pain in right knee: Secondary | ICD-10-CM | POA: Diagnosis not present

## 2015-06-08 DIAGNOSIS — G8929 Other chronic pain: Secondary | ICD-10-CM | POA: Diagnosis not present

## 2015-06-08 DIAGNOSIS — Z96651 Presence of right artificial knee joint: Secondary | ICD-10-CM | POA: Diagnosis not present

## 2015-06-08 DIAGNOSIS — M545 Low back pain: Secondary | ICD-10-CM | POA: Diagnosis not present

## 2015-06-08 DIAGNOSIS — F331 Major depressive disorder, recurrent, moderate: Secondary | ICD-10-CM | POA: Diagnosis not present

## 2015-06-10 DIAGNOSIS — G8929 Other chronic pain: Secondary | ICD-10-CM | POA: Diagnosis not present

## 2015-06-10 DIAGNOSIS — M961 Postlaminectomy syndrome, not elsewhere classified: Secondary | ICD-10-CM | POA: Diagnosis not present

## 2015-06-10 DIAGNOSIS — F4542 Pain disorder with related psychological factors: Secondary | ICD-10-CM | POA: Diagnosis not present

## 2015-06-10 DIAGNOSIS — Z79891 Long term (current) use of opiate analgesic: Secondary | ICD-10-CM | POA: Diagnosis not present

## 2015-06-10 DIAGNOSIS — M545 Low back pain: Secondary | ICD-10-CM | POA: Diagnosis not present

## 2015-06-10 DIAGNOSIS — Z96651 Presence of right artificial knee joint: Secondary | ICD-10-CM | POA: Diagnosis not present

## 2015-06-10 DIAGNOSIS — M25561 Pain in right knee: Secondary | ICD-10-CM | POA: Diagnosis not present

## 2015-06-10 DIAGNOSIS — M25551 Pain in right hip: Secondary | ICD-10-CM | POA: Diagnosis not present

## 2015-06-10 DIAGNOSIS — G500 Trigeminal neuralgia: Secondary | ICD-10-CM | POA: Diagnosis not present

## 2015-06-10 DIAGNOSIS — N3010 Interstitial cystitis (chronic) without hematuria: Secondary | ICD-10-CM | POA: Diagnosis not present

## 2015-06-11 DIAGNOSIS — Z96651 Presence of right artificial knee joint: Secondary | ICD-10-CM | POA: Diagnosis not present

## 2015-06-11 DIAGNOSIS — M6281 Muscle weakness (generalized): Secondary | ICD-10-CM | POA: Diagnosis not present

## 2015-06-11 DIAGNOSIS — M2560 Stiffness of unspecified joint, not elsewhere classified: Secondary | ICD-10-CM | POA: Diagnosis not present

## 2015-06-11 DIAGNOSIS — R600 Localized edema: Secondary | ICD-10-CM | POA: Diagnosis not present

## 2015-06-15 DIAGNOSIS — Z96651 Presence of right artificial knee joint: Secondary | ICD-10-CM | POA: Diagnosis not present

## 2015-06-15 DIAGNOSIS — M2560 Stiffness of unspecified joint, not elsewhere classified: Secondary | ICD-10-CM | POA: Diagnosis not present

## 2015-06-15 DIAGNOSIS — M6281 Muscle weakness (generalized): Secondary | ICD-10-CM | POA: Diagnosis not present

## 2015-06-15 DIAGNOSIS — R600 Localized edema: Secondary | ICD-10-CM | POA: Diagnosis not present

## 2015-06-19 DIAGNOSIS — M6281 Muscle weakness (generalized): Secondary | ICD-10-CM | POA: Diagnosis not present

## 2015-06-19 DIAGNOSIS — M2560 Stiffness of unspecified joint, not elsewhere classified: Secondary | ICD-10-CM | POA: Diagnosis not present

## 2015-06-19 DIAGNOSIS — R600 Localized edema: Secondary | ICD-10-CM | POA: Diagnosis not present

## 2015-06-19 DIAGNOSIS — Z96651 Presence of right artificial knee joint: Secondary | ICD-10-CM | POA: Diagnosis not present

## 2015-06-23 DIAGNOSIS — I10 Essential (primary) hypertension: Secondary | ICD-10-CM | POA: Diagnosis not present

## 2015-06-23 DIAGNOSIS — G500 Trigeminal neuralgia: Secondary | ICD-10-CM | POA: Diagnosis not present

## 2015-06-23 DIAGNOSIS — F329 Major depressive disorder, single episode, unspecified: Secondary | ICD-10-CM | POA: Diagnosis not present

## 2015-06-23 DIAGNOSIS — M159 Polyosteoarthritis, unspecified: Secondary | ICD-10-CM | POA: Diagnosis not present

## 2015-06-24 DIAGNOSIS — G8929 Other chronic pain: Secondary | ICD-10-CM | POA: Diagnosis not present

## 2015-06-24 DIAGNOSIS — G500 Trigeminal neuralgia: Secondary | ICD-10-CM | POA: Diagnosis not present

## 2015-06-24 DIAGNOSIS — M961 Postlaminectomy syndrome, not elsewhere classified: Secondary | ICD-10-CM | POA: Diagnosis not present

## 2015-06-24 DIAGNOSIS — M25562 Pain in left knee: Secondary | ICD-10-CM | POA: Diagnosis not present

## 2015-06-24 DIAGNOSIS — M545 Low back pain: Secondary | ICD-10-CM | POA: Diagnosis not present

## 2015-06-24 DIAGNOSIS — M5481 Occipital neuralgia: Secondary | ICD-10-CM | POA: Diagnosis not present

## 2015-06-24 DIAGNOSIS — M25561 Pain in right knee: Secondary | ICD-10-CM | POA: Diagnosis not present

## 2015-06-24 DIAGNOSIS — M25551 Pain in right hip: Secondary | ICD-10-CM | POA: Diagnosis not present

## 2015-06-24 DIAGNOSIS — N3010 Interstitial cystitis (chronic) without hematuria: Secondary | ICD-10-CM | POA: Diagnosis not present

## 2015-06-25 DIAGNOSIS — M25561 Pain in right knee: Secondary | ICD-10-CM | POA: Diagnosis not present

## 2015-06-25 DIAGNOSIS — M2560 Stiffness of unspecified joint, not elsewhere classified: Secondary | ICD-10-CM | POA: Diagnosis not present

## 2015-06-25 DIAGNOSIS — M6281 Muscle weakness (generalized): Secondary | ICD-10-CM | POA: Diagnosis not present

## 2015-06-25 DIAGNOSIS — Z96651 Presence of right artificial knee joint: Secondary | ICD-10-CM | POA: Diagnosis not present

## 2015-06-26 DIAGNOSIS — M25561 Pain in right knee: Secondary | ICD-10-CM | POA: Diagnosis not present

## 2015-06-26 DIAGNOSIS — M2560 Stiffness of unspecified joint, not elsewhere classified: Secondary | ICD-10-CM | POA: Diagnosis not present

## 2015-06-26 DIAGNOSIS — M6281 Muscle weakness (generalized): Secondary | ICD-10-CM | POA: Diagnosis not present

## 2015-06-26 DIAGNOSIS — Z96651 Presence of right artificial knee joint: Secondary | ICD-10-CM | POA: Diagnosis not present

## 2015-06-29 DIAGNOSIS — Z96651 Presence of right artificial knee joint: Secondary | ICD-10-CM | POA: Diagnosis not present

## 2015-06-29 DIAGNOSIS — M6281 Muscle weakness (generalized): Secondary | ICD-10-CM | POA: Diagnosis not present

## 2015-06-29 DIAGNOSIS — M2560 Stiffness of unspecified joint, not elsewhere classified: Secondary | ICD-10-CM | POA: Diagnosis not present

## 2015-06-29 DIAGNOSIS — M25561 Pain in right knee: Secondary | ICD-10-CM | POA: Diagnosis not present

## 2015-07-01 DIAGNOSIS — M2560 Stiffness of unspecified joint, not elsewhere classified: Secondary | ICD-10-CM | POA: Diagnosis not present

## 2015-07-01 DIAGNOSIS — M6281 Muscle weakness (generalized): Secondary | ICD-10-CM | POA: Diagnosis not present

## 2015-07-01 DIAGNOSIS — Z96651 Presence of right artificial knee joint: Secondary | ICD-10-CM | POA: Diagnosis not present

## 2015-07-01 DIAGNOSIS — M25561 Pain in right knee: Secondary | ICD-10-CM | POA: Diagnosis not present

## 2015-07-06 DIAGNOSIS — M6281 Muscle weakness (generalized): Secondary | ICD-10-CM | POA: Diagnosis not present

## 2015-07-06 DIAGNOSIS — Z96651 Presence of right artificial knee joint: Secondary | ICD-10-CM | POA: Diagnosis not present

## 2015-07-06 DIAGNOSIS — M2560 Stiffness of unspecified joint, not elsewhere classified: Secondary | ICD-10-CM | POA: Diagnosis not present

## 2015-07-06 DIAGNOSIS — M25561 Pain in right knee: Secondary | ICD-10-CM | POA: Diagnosis not present

## 2015-07-09 DIAGNOSIS — M6281 Muscle weakness (generalized): Secondary | ICD-10-CM | POA: Diagnosis not present

## 2015-07-09 DIAGNOSIS — Z96651 Presence of right artificial knee joint: Secondary | ICD-10-CM | POA: Diagnosis not present

## 2015-07-09 DIAGNOSIS — R600 Localized edema: Secondary | ICD-10-CM | POA: Diagnosis not present

## 2015-07-09 DIAGNOSIS — M25561 Pain in right knee: Secondary | ICD-10-CM | POA: Diagnosis not present

## 2015-07-16 DIAGNOSIS — M2560 Stiffness of unspecified joint, not elsewhere classified: Secondary | ICD-10-CM | POA: Diagnosis not present

## 2015-07-16 DIAGNOSIS — Z96651 Presence of right artificial knee joint: Secondary | ICD-10-CM | POA: Diagnosis not present

## 2015-07-16 DIAGNOSIS — M6281 Muscle weakness (generalized): Secondary | ICD-10-CM | POA: Diagnosis not present

## 2015-07-16 DIAGNOSIS — R600 Localized edema: Secondary | ICD-10-CM | POA: Diagnosis not present

## 2015-07-17 DIAGNOSIS — M791 Myalgia: Secondary | ICD-10-CM | POA: Diagnosis not present

## 2015-07-17 DIAGNOSIS — M545 Low back pain: Secondary | ICD-10-CM | POA: Diagnosis not present

## 2015-07-20 DIAGNOSIS — M25561 Pain in right knee: Secondary | ICD-10-CM | POA: Diagnosis not present

## 2015-07-20 DIAGNOSIS — M2560 Stiffness of unspecified joint, not elsewhere classified: Secondary | ICD-10-CM | POA: Diagnosis not present

## 2015-07-20 DIAGNOSIS — M6281 Muscle weakness (generalized): Secondary | ICD-10-CM | POA: Diagnosis not present

## 2015-07-20 DIAGNOSIS — Z96651 Presence of right artificial knee joint: Secondary | ICD-10-CM | POA: Diagnosis not present

## 2015-07-20 DIAGNOSIS — R600 Localized edema: Secondary | ICD-10-CM | POA: Diagnosis not present

## 2015-07-24 DIAGNOSIS — M2560 Stiffness of unspecified joint, not elsewhere classified: Secondary | ICD-10-CM | POA: Diagnosis not present

## 2015-07-24 DIAGNOSIS — M6281 Muscle weakness (generalized): Secondary | ICD-10-CM | POA: Diagnosis not present

## 2015-07-24 DIAGNOSIS — M25561 Pain in right knee: Secondary | ICD-10-CM | POA: Diagnosis not present

## 2015-07-24 DIAGNOSIS — Z96651 Presence of right artificial knee joint: Secondary | ICD-10-CM | POA: Diagnosis not present

## 2015-07-24 DIAGNOSIS — R600 Localized edema: Secondary | ICD-10-CM | POA: Diagnosis not present

## 2015-07-28 DIAGNOSIS — M25561 Pain in right knee: Secondary | ICD-10-CM | POA: Diagnosis not present

## 2015-07-28 DIAGNOSIS — M6281 Muscle weakness (generalized): Secondary | ICD-10-CM | POA: Diagnosis not present

## 2015-07-28 DIAGNOSIS — M2560 Stiffness of unspecified joint, not elsewhere classified: Secondary | ICD-10-CM | POA: Diagnosis not present

## 2015-07-28 DIAGNOSIS — R600 Localized edema: Secondary | ICD-10-CM | POA: Diagnosis not present

## 2015-07-28 DIAGNOSIS — Z96651 Presence of right artificial knee joint: Secondary | ICD-10-CM | POA: Diagnosis not present

## 2015-07-30 DIAGNOSIS — M961 Postlaminectomy syndrome, not elsewhere classified: Secondary | ICD-10-CM | POA: Diagnosis not present

## 2015-07-30 DIAGNOSIS — G500 Trigeminal neuralgia: Secondary | ICD-10-CM | POA: Diagnosis not present

## 2015-07-30 DIAGNOSIS — M791 Myalgia: Secondary | ICD-10-CM | POA: Diagnosis not present

## 2015-07-30 DIAGNOSIS — M545 Low back pain: Secondary | ICD-10-CM | POA: Diagnosis not present

## 2015-07-30 DIAGNOSIS — F331 Major depressive disorder, recurrent, moderate: Secondary | ICD-10-CM | POA: Diagnosis not present

## 2015-07-30 DIAGNOSIS — F4542 Pain disorder with related psychological factors: Secondary | ICD-10-CM | POA: Diagnosis not present

## 2015-07-30 DIAGNOSIS — M25551 Pain in right hip: Secondary | ICD-10-CM | POA: Diagnosis not present

## 2015-07-30 DIAGNOSIS — M5481 Occipital neuralgia: Secondary | ICD-10-CM | POA: Diagnosis not present

## 2015-07-30 DIAGNOSIS — M25561 Pain in right knee: Secondary | ICD-10-CM | POA: Diagnosis not present

## 2015-07-30 DIAGNOSIS — M25562 Pain in left knee: Secondary | ICD-10-CM | POA: Diagnosis not present

## 2015-07-30 DIAGNOSIS — N3010 Interstitial cystitis (chronic) without hematuria: Secondary | ICD-10-CM | POA: Diagnosis not present

## 2015-07-30 DIAGNOSIS — G8929 Other chronic pain: Secondary | ICD-10-CM | POA: Diagnosis not present

## 2015-07-31 DIAGNOSIS — Z96651 Presence of right artificial knee joint: Secondary | ICD-10-CM | POA: Diagnosis not present

## 2015-08-05 DIAGNOSIS — R51 Headache: Secondary | ICD-10-CM | POA: Diagnosis not present

## 2015-08-05 DIAGNOSIS — M5481 Occipital neuralgia: Secondary | ICD-10-CM | POA: Diagnosis not present

## 2015-08-11 DIAGNOSIS — Z471 Aftercare following joint replacement surgery: Secondary | ICD-10-CM | POA: Diagnosis not present

## 2015-08-11 DIAGNOSIS — Z96651 Presence of right artificial knee joint: Secondary | ICD-10-CM | POA: Diagnosis not present

## 2015-08-11 DIAGNOSIS — Z9889 Other specified postprocedural states: Secondary | ICD-10-CM | POA: Diagnosis not present

## 2015-08-17 DIAGNOSIS — Z1231 Encounter for screening mammogram for malignant neoplasm of breast: Secondary | ICD-10-CM | POA: Diagnosis not present

## 2015-08-28 DIAGNOSIS — M25512 Pain in left shoulder: Secondary | ICD-10-CM | POA: Diagnosis not present

## 2015-08-31 DIAGNOSIS — N959 Unspecified menopausal and perimenopausal disorder: Secondary | ICD-10-CM | POA: Diagnosis not present

## 2015-08-31 DIAGNOSIS — I10 Essential (primary) hypertension: Secondary | ICD-10-CM | POA: Diagnosis not present

## 2015-09-02 DIAGNOSIS — Z79891 Long term (current) use of opiate analgesic: Secondary | ICD-10-CM | POA: Diagnosis not present

## 2015-09-02 DIAGNOSIS — G8929 Other chronic pain: Secondary | ICD-10-CM | POA: Insufficient documentation

## 2015-09-02 DIAGNOSIS — M961 Postlaminectomy syndrome, not elsewhere classified: Secondary | ICD-10-CM | POA: Diagnosis not present

## 2015-09-02 DIAGNOSIS — F329 Major depressive disorder, single episode, unspecified: Secondary | ICD-10-CM | POA: Diagnosis not present

## 2015-09-02 DIAGNOSIS — M25512 Pain in left shoulder: Secondary | ICD-10-CM | POA: Diagnosis not present

## 2015-09-02 DIAGNOSIS — F4542 Pain disorder with related psychological factors: Secondary | ICD-10-CM | POA: Diagnosis not present

## 2015-09-02 DIAGNOSIS — M791 Myalgia: Secondary | ICD-10-CM | POA: Diagnosis not present

## 2015-09-02 DIAGNOSIS — G894 Chronic pain syndrome: Secondary | ICD-10-CM | POA: Diagnosis not present

## 2015-09-02 DIAGNOSIS — N3010 Interstitial cystitis (chronic) without hematuria: Secondary | ICD-10-CM | POA: Diagnosis not present

## 2015-09-02 DIAGNOSIS — M5481 Occipital neuralgia: Secondary | ICD-10-CM | POA: Diagnosis not present

## 2015-09-02 DIAGNOSIS — M25561 Pain in right knee: Secondary | ICD-10-CM | POA: Diagnosis not present

## 2015-09-02 DIAGNOSIS — G500 Trigeminal neuralgia: Secondary | ICD-10-CM | POA: Diagnosis not present

## 2015-09-03 DIAGNOSIS — S46012D Strain of muscle(s) and tendon(s) of the rotator cuff of left shoulder, subsequent encounter: Secondary | ICD-10-CM | POA: Diagnosis not present

## 2015-09-03 DIAGNOSIS — M25512 Pain in left shoulder: Secondary | ICD-10-CM | POA: Diagnosis not present

## 2015-09-03 DIAGNOSIS — M7542 Impingement syndrome of left shoulder: Secondary | ICD-10-CM | POA: Diagnosis not present

## 2015-09-04 DIAGNOSIS — S46012D Strain of muscle(s) and tendon(s) of the rotator cuff of left shoulder, subsequent encounter: Secondary | ICD-10-CM | POA: Diagnosis not present

## 2015-09-04 DIAGNOSIS — M25512 Pain in left shoulder: Secondary | ICD-10-CM | POA: Diagnosis not present

## 2015-09-04 DIAGNOSIS — M7542 Impingement syndrome of left shoulder: Secondary | ICD-10-CM | POA: Diagnosis not present

## 2015-09-08 DIAGNOSIS — S46012D Strain of muscle(s) and tendon(s) of the rotator cuff of left shoulder, subsequent encounter: Secondary | ICD-10-CM | POA: Diagnosis not present

## 2015-09-08 DIAGNOSIS — M7542 Impingement syndrome of left shoulder: Secondary | ICD-10-CM | POA: Diagnosis not present

## 2015-09-08 DIAGNOSIS — M25512 Pain in left shoulder: Secondary | ICD-10-CM | POA: Diagnosis not present

## 2015-09-10 DIAGNOSIS — M797 Fibromyalgia: Secondary | ICD-10-CM | POA: Diagnosis not present

## 2015-09-10 DIAGNOSIS — M25512 Pain in left shoulder: Secondary | ICD-10-CM | POA: Diagnosis not present

## 2015-09-10 DIAGNOSIS — I10 Essential (primary) hypertension: Secondary | ICD-10-CM | POA: Diagnosis not present

## 2015-09-10 DIAGNOSIS — M7542 Impingement syndrome of left shoulder: Secondary | ICD-10-CM | POA: Diagnosis not present

## 2015-09-11 DIAGNOSIS — M25512 Pain in left shoulder: Secondary | ICD-10-CM | POA: Diagnosis not present

## 2015-09-11 DIAGNOSIS — M7542 Impingement syndrome of left shoulder: Secondary | ICD-10-CM | POA: Diagnosis not present

## 2015-09-11 DIAGNOSIS — S46012D Strain of muscle(s) and tendon(s) of the rotator cuff of left shoulder, subsequent encounter: Secondary | ICD-10-CM | POA: Diagnosis not present

## 2015-09-15 DIAGNOSIS — M25512 Pain in left shoulder: Secondary | ICD-10-CM | POA: Diagnosis not present

## 2015-09-15 DIAGNOSIS — M19012 Primary osteoarthritis, left shoulder: Secondary | ICD-10-CM | POA: Diagnosis not present

## 2015-09-15 DIAGNOSIS — S46012D Strain of muscle(s) and tendon(s) of the rotator cuff of left shoulder, subsequent encounter: Secondary | ICD-10-CM | POA: Diagnosis not present

## 2015-09-15 DIAGNOSIS — M7542 Impingement syndrome of left shoulder: Secondary | ICD-10-CM | POA: Diagnosis not present

## 2015-09-22 DIAGNOSIS — M25512 Pain in left shoulder: Secondary | ICD-10-CM | POA: Diagnosis not present

## 2015-09-22 DIAGNOSIS — M7542 Impingement syndrome of left shoulder: Secondary | ICD-10-CM | POA: Diagnosis not present

## 2015-09-22 DIAGNOSIS — S46012D Strain of muscle(s) and tendon(s) of the rotator cuff of left shoulder, subsequent encounter: Secondary | ICD-10-CM | POA: Diagnosis not present

## 2015-09-28 DIAGNOSIS — M7542 Impingement syndrome of left shoulder: Secondary | ICD-10-CM | POA: Diagnosis not present

## 2015-09-28 DIAGNOSIS — S46012D Strain of muscle(s) and tendon(s) of the rotator cuff of left shoulder, subsequent encounter: Secondary | ICD-10-CM | POA: Diagnosis not present

## 2015-09-28 DIAGNOSIS — M25512 Pain in left shoulder: Secondary | ICD-10-CM | POA: Diagnosis not present

## 2015-09-29 DIAGNOSIS — M19012 Primary osteoarthritis, left shoulder: Secondary | ICD-10-CM | POA: Diagnosis not present

## 2015-09-29 DIAGNOSIS — M25512 Pain in left shoulder: Secondary | ICD-10-CM | POA: Diagnosis not present

## 2015-10-01 DIAGNOSIS — M25512 Pain in left shoulder: Secondary | ICD-10-CM | POA: Diagnosis not present

## 2015-10-01 DIAGNOSIS — S46012D Strain of muscle(s) and tendon(s) of the rotator cuff of left shoulder, subsequent encounter: Secondary | ICD-10-CM | POA: Diagnosis not present

## 2015-10-01 DIAGNOSIS — M7542 Impingement syndrome of left shoulder: Secondary | ICD-10-CM | POA: Diagnosis not present

## 2015-10-02 DIAGNOSIS — N3010 Interstitial cystitis (chronic) without hematuria: Secondary | ICD-10-CM | POA: Diagnosis not present

## 2015-10-02 DIAGNOSIS — G894 Chronic pain syndrome: Secondary | ICD-10-CM | POA: Diagnosis not present

## 2015-10-02 DIAGNOSIS — F331 Major depressive disorder, recurrent, moderate: Secondary | ICD-10-CM | POA: Diagnosis not present

## 2015-10-02 DIAGNOSIS — F329 Major depressive disorder, single episode, unspecified: Secondary | ICD-10-CM | POA: Diagnosis not present

## 2015-10-02 DIAGNOSIS — M25551 Pain in right hip: Secondary | ICD-10-CM | POA: Diagnosis not present

## 2015-10-02 DIAGNOSIS — M25561 Pain in right knee: Secondary | ICD-10-CM | POA: Diagnosis not present

## 2015-10-02 DIAGNOSIS — F4542 Pain disorder with related psychological factors: Secondary | ICD-10-CM | POA: Diagnosis not present

## 2015-10-02 DIAGNOSIS — I10 Essential (primary) hypertension: Secondary | ICD-10-CM | POA: Diagnosis not present

## 2015-10-02 DIAGNOSIS — M25512 Pain in left shoulder: Secondary | ICD-10-CM | POA: Diagnosis not present

## 2015-10-02 DIAGNOSIS — M5481 Occipital neuralgia: Secondary | ICD-10-CM | POA: Diagnosis not present

## 2015-10-02 DIAGNOSIS — M961 Postlaminectomy syndrome, not elsewhere classified: Secondary | ICD-10-CM | POA: Diagnosis not present

## 2015-10-02 DIAGNOSIS — M791 Myalgia: Secondary | ICD-10-CM | POA: Diagnosis not present

## 2015-10-02 DIAGNOSIS — G500 Trigeminal neuralgia: Secondary | ICD-10-CM | POA: Diagnosis not present

## 2015-10-02 DIAGNOSIS — Z79891 Long term (current) use of opiate analgesic: Secondary | ICD-10-CM | POA: Diagnosis not present

## 2015-10-05 DIAGNOSIS — S46012D Strain of muscle(s) and tendon(s) of the rotator cuff of left shoulder, subsequent encounter: Secondary | ICD-10-CM | POA: Diagnosis not present

## 2015-10-05 DIAGNOSIS — M25512 Pain in left shoulder: Secondary | ICD-10-CM | POA: Diagnosis not present

## 2015-10-05 DIAGNOSIS — M7542 Impingement syndrome of left shoulder: Secondary | ICD-10-CM | POA: Diagnosis not present

## 2015-10-08 DIAGNOSIS — R922 Inconclusive mammogram: Secondary | ICD-10-CM | POA: Diagnosis not present

## 2015-10-09 DIAGNOSIS — M25512 Pain in left shoulder: Secondary | ICD-10-CM | POA: Diagnosis not present

## 2015-10-09 DIAGNOSIS — S46012D Strain of muscle(s) and tendon(s) of the rotator cuff of left shoulder, subsequent encounter: Secondary | ICD-10-CM | POA: Diagnosis not present

## 2015-10-09 DIAGNOSIS — M7542 Impingement syndrome of left shoulder: Secondary | ICD-10-CM | POA: Diagnosis not present

## 2015-10-13 DIAGNOSIS — M7542 Impingement syndrome of left shoulder: Secondary | ICD-10-CM | POA: Diagnosis not present

## 2015-10-13 DIAGNOSIS — S46012D Strain of muscle(s) and tendon(s) of the rotator cuff of left shoulder, subsequent encounter: Secondary | ICD-10-CM | POA: Diagnosis not present

## 2015-10-13 DIAGNOSIS — M25512 Pain in left shoulder: Secondary | ICD-10-CM | POA: Diagnosis not present

## 2015-10-14 DIAGNOSIS — I10 Essential (primary) hypertension: Secondary | ICD-10-CM | POA: Diagnosis not present

## 2015-10-14 DIAGNOSIS — Z79890 Hormone replacement therapy (postmenopausal): Secondary | ICD-10-CM | POA: Diagnosis not present

## 2015-10-14 DIAGNOSIS — E672 Megavitamin-B6 syndrome: Secondary | ICD-10-CM | POA: Diagnosis not present

## 2015-10-14 DIAGNOSIS — E1165 Type 2 diabetes mellitus with hyperglycemia: Secondary | ICD-10-CM | POA: Diagnosis not present

## 2015-10-14 DIAGNOSIS — E782 Mixed hyperlipidemia: Secondary | ICD-10-CM | POA: Diagnosis not present

## 2015-10-14 DIAGNOSIS — N390 Urinary tract infection, site not specified: Secondary | ICD-10-CM | POA: Diagnosis not present

## 2015-10-14 DIAGNOSIS — Z1211 Encounter for screening for malignant neoplasm of colon: Secondary | ICD-10-CM | POA: Diagnosis not present

## 2015-10-14 DIAGNOSIS — K219 Gastro-esophageal reflux disease without esophagitis: Secondary | ICD-10-CM | POA: Diagnosis not present

## 2015-10-14 DIAGNOSIS — E039 Hypothyroidism, unspecified: Secondary | ICD-10-CM | POA: Diagnosis not present

## 2015-10-14 DIAGNOSIS — M810 Age-related osteoporosis without current pathological fracture: Secondary | ICD-10-CM | POA: Diagnosis not present

## 2015-10-14 DIAGNOSIS — D649 Anemia, unspecified: Secondary | ICD-10-CM | POA: Diagnosis not present

## 2015-10-14 DIAGNOSIS — G5600 Carpal tunnel syndrome, unspecified upper limb: Secondary | ICD-10-CM | POA: Diagnosis not present

## 2015-10-14 DIAGNOSIS — E559 Vitamin D deficiency, unspecified: Secondary | ICD-10-CM | POA: Diagnosis not present

## 2015-10-14 DIAGNOSIS — M545 Low back pain: Secondary | ICD-10-CM | POA: Diagnosis not present

## 2015-10-14 DIAGNOSIS — Z0000 Encounter for general adult medical examination without abnormal findings: Secondary | ICD-10-CM | POA: Diagnosis not present

## 2015-10-15 DIAGNOSIS — S46012D Strain of muscle(s) and tendon(s) of the rotator cuff of left shoulder, subsequent encounter: Secondary | ICD-10-CM | POA: Diagnosis not present

## 2015-10-15 DIAGNOSIS — M7542 Impingement syndrome of left shoulder: Secondary | ICD-10-CM | POA: Diagnosis not present

## 2015-10-15 DIAGNOSIS — M25512 Pain in left shoulder: Secondary | ICD-10-CM | POA: Diagnosis not present

## 2015-10-19 DIAGNOSIS — M7542 Impingement syndrome of left shoulder: Secondary | ICD-10-CM | POA: Diagnosis not present

## 2015-10-19 DIAGNOSIS — M25512 Pain in left shoulder: Secondary | ICD-10-CM | POA: Diagnosis not present

## 2015-10-19 DIAGNOSIS — S46012D Strain of muscle(s) and tendon(s) of the rotator cuff of left shoulder, subsequent encounter: Secondary | ICD-10-CM | POA: Diagnosis not present

## 2015-10-23 DIAGNOSIS — S46012D Strain of muscle(s) and tendon(s) of the rotator cuff of left shoulder, subsequent encounter: Secondary | ICD-10-CM | POA: Diagnosis not present

## 2015-10-23 DIAGNOSIS — M25512 Pain in left shoulder: Secondary | ICD-10-CM | POA: Diagnosis not present

## 2015-10-23 DIAGNOSIS — M7542 Impingement syndrome of left shoulder: Secondary | ICD-10-CM | POA: Diagnosis not present

## 2015-10-26 DIAGNOSIS — M5481 Occipital neuralgia: Secondary | ICD-10-CM | POA: Diagnosis not present

## 2015-10-26 DIAGNOSIS — G894 Chronic pain syndrome: Secondary | ICD-10-CM | POA: Diagnosis not present

## 2015-10-26 DIAGNOSIS — M25512 Pain in left shoulder: Secondary | ICD-10-CM | POA: Diagnosis not present

## 2015-10-26 DIAGNOSIS — N3010 Interstitial cystitis (chronic) without hematuria: Secondary | ICD-10-CM | POA: Diagnosis not present

## 2015-10-26 DIAGNOSIS — F4542 Pain disorder with related psychological factors: Secondary | ICD-10-CM | POA: Diagnosis not present

## 2015-10-26 DIAGNOSIS — Z79891 Long term (current) use of opiate analgesic: Secondary | ICD-10-CM | POA: Diagnosis not present

## 2015-10-26 DIAGNOSIS — F331 Major depressive disorder, recurrent, moderate: Secondary | ICD-10-CM | POA: Diagnosis not present

## 2015-10-26 DIAGNOSIS — G500 Trigeminal neuralgia: Secondary | ICD-10-CM | POA: Diagnosis not present

## 2015-10-26 DIAGNOSIS — M25551 Pain in right hip: Secondary | ICD-10-CM | POA: Diagnosis not present

## 2015-10-26 DIAGNOSIS — M791 Myalgia: Secondary | ICD-10-CM | POA: Diagnosis not present

## 2015-10-26 DIAGNOSIS — M25561 Pain in right knee: Secondary | ICD-10-CM | POA: Diagnosis not present

## 2015-10-26 DIAGNOSIS — I10 Essential (primary) hypertension: Secondary | ICD-10-CM | POA: Diagnosis not present

## 2015-10-26 DIAGNOSIS — M961 Postlaminectomy syndrome, not elsewhere classified: Secondary | ICD-10-CM | POA: Diagnosis not present

## 2015-10-27 DIAGNOSIS — M25512 Pain in left shoulder: Secondary | ICD-10-CM | POA: Diagnosis not present

## 2016-01-12 DIAGNOSIS — E7800 Pure hypercholesterolemia, unspecified: Secondary | ICD-10-CM | POA: Diagnosis not present

## 2016-01-12 DIAGNOSIS — G8929 Other chronic pain: Secondary | ICD-10-CM | POA: Diagnosis not present

## 2016-02-17 DIAGNOSIS — M545 Low back pain: Secondary | ICD-10-CM | POA: Diagnosis not present

## 2016-02-17 DIAGNOSIS — Z79891 Long term (current) use of opiate analgesic: Secondary | ICD-10-CM | POA: Diagnosis not present

## 2016-02-17 DIAGNOSIS — Z79899 Other long term (current) drug therapy: Secondary | ICD-10-CM | POA: Diagnosis not present

## 2016-02-17 DIAGNOSIS — M25569 Pain in unspecified knee: Secondary | ICD-10-CM | POA: Diagnosis not present

## 2016-02-17 DIAGNOSIS — M542 Cervicalgia: Secondary | ICD-10-CM | POA: Diagnosis not present

## 2016-02-17 DIAGNOSIS — G894 Chronic pain syndrome: Secondary | ICD-10-CM | POA: Diagnosis not present

## 2016-02-19 DIAGNOSIS — R2231 Localized swelling, mass and lump, right upper limb: Secondary | ICD-10-CM | POA: Diagnosis not present

## 2016-02-19 DIAGNOSIS — G500 Trigeminal neuralgia: Secondary | ICD-10-CM | POA: Diagnosis not present

## 2016-02-19 DIAGNOSIS — M25461 Effusion, right knee: Secondary | ICD-10-CM | POA: Diagnosis not present

## 2016-02-19 DIAGNOSIS — G8929 Other chronic pain: Secondary | ICD-10-CM | POA: Diagnosis not present

## 2016-02-19 DIAGNOSIS — E7800 Pure hypercholesterolemia, unspecified: Secondary | ICD-10-CM | POA: Diagnosis not present

## 2016-02-24 DIAGNOSIS — G894 Chronic pain syndrome: Secondary | ICD-10-CM | POA: Diagnosis not present

## 2016-02-24 DIAGNOSIS — Z79899 Other long term (current) drug therapy: Secondary | ICD-10-CM | POA: Diagnosis not present

## 2016-02-24 DIAGNOSIS — M47812 Spondylosis without myelopathy or radiculopathy, cervical region: Secondary | ICD-10-CM | POA: Diagnosis not present

## 2016-02-24 DIAGNOSIS — Z79891 Long term (current) use of opiate analgesic: Secondary | ICD-10-CM | POA: Diagnosis not present

## 2016-03-02 ENCOUNTER — Other Ambulatory Visit (HOSPITAL_COMMUNITY): Payer: Self-pay | Admitting: Orthopedic Surgery

## 2016-03-02 DIAGNOSIS — M25561 Pain in right knee: Secondary | ICD-10-CM | POA: Diagnosis not present

## 2016-03-07 DIAGNOSIS — M25561 Pain in right knee: Secondary | ICD-10-CM | POA: Diagnosis not present

## 2016-03-09 ENCOUNTER — Encounter (HOSPITAL_COMMUNITY): Payer: Self-pay

## 2016-03-10 DIAGNOSIS — A6004 Herpesviral vulvovaginitis: Secondary | ICD-10-CM | POA: Diagnosis not present

## 2016-03-10 DIAGNOSIS — Z803 Family history of malignant neoplasm of breast: Secondary | ICD-10-CM | POA: Diagnosis not present

## 2016-03-10 DIAGNOSIS — E7800 Pure hypercholesterolemia, unspecified: Secondary | ICD-10-CM | POA: Diagnosis not present

## 2016-03-10 DIAGNOSIS — Z9189 Other specified personal risk factors, not elsewhere classified: Secondary | ICD-10-CM | POA: Diagnosis not present

## 2016-03-10 DIAGNOSIS — M25461 Effusion, right knee: Secondary | ICD-10-CM | POA: Diagnosis not present

## 2016-03-10 DIAGNOSIS — E8940 Asymptomatic postprocedural ovarian failure: Secondary | ICD-10-CM | POA: Diagnosis not present

## 2016-03-14 ENCOUNTER — Ambulatory Visit (HOSPITAL_COMMUNITY): Payer: Self-pay

## 2016-03-14 ENCOUNTER — Other Ambulatory Visit: Payer: Self-pay | Admitting: Obstetrics and Gynecology

## 2016-03-14 ENCOUNTER — Encounter (HOSPITAL_COMMUNITY): Payer: Self-pay

## 2016-03-14 DIAGNOSIS — R921 Mammographic calcification found on diagnostic imaging of breast: Secondary | ICD-10-CM

## 2016-03-16 ENCOUNTER — Telehealth: Payer: Self-pay | Admitting: Genetic Counselor

## 2016-03-16 DIAGNOSIS — M25569 Pain in unspecified knee: Secondary | ICD-10-CM | POA: Diagnosis not present

## 2016-03-16 DIAGNOSIS — M545 Low back pain: Secondary | ICD-10-CM | POA: Diagnosis not present

## 2016-03-16 DIAGNOSIS — R51 Headache: Secondary | ICD-10-CM | POA: Diagnosis not present

## 2016-03-16 DIAGNOSIS — M25519 Pain in unspecified shoulder: Secondary | ICD-10-CM | POA: Diagnosis not present

## 2016-03-16 DIAGNOSIS — G894 Chronic pain syndrome: Secondary | ICD-10-CM | POA: Diagnosis not present

## 2016-03-16 DIAGNOSIS — Z79899 Other long term (current) drug therapy: Secondary | ICD-10-CM | POA: Diagnosis not present

## 2016-03-16 DIAGNOSIS — M7500 Adhesive capsulitis of unspecified shoulder: Secondary | ICD-10-CM | POA: Diagnosis not present

## 2016-03-16 DIAGNOSIS — Z79891 Long term (current) use of opiate analgesic: Secondary | ICD-10-CM | POA: Diagnosis not present

## 2016-03-16 DIAGNOSIS — M19019 Primary osteoarthritis, unspecified shoulder: Secondary | ICD-10-CM | POA: Diagnosis not present

## 2016-03-16 NOTE — Telephone Encounter (Signed)
Genetic counseling appt scheduled for 9/6 w/Karen Florene Glen. Letter will be mailed to the patient and faxed to the referring.

## 2016-03-17 ENCOUNTER — Encounter (HOSPITAL_COMMUNITY)
Admission: RE | Admit: 2016-03-17 | Discharge: 2016-03-17 | Disposition: A | Discharge status: Home / Self Care | Payer: Medicare Other | Source: Ambulatory Visit | Attending: Orthopedic Surgery | Admitting: Orthopedic Surgery

## 2016-03-17 DIAGNOSIS — M25561 Pain in right knee: Secondary | ICD-10-CM | POA: Diagnosis not present

## 2016-03-17 DIAGNOSIS — Z96611 Presence of right artificial shoulder joint: Secondary | ICD-10-CM | POA: Insufficient documentation

## 2016-03-17 MED ORDER — SODIUM IODIDE I 131 CAPSULE
25.0000 | Freq: Once | INTRAVENOUS | Status: AC | PRN
  Administered 2016-03-17: 25 via ORAL

## 2016-03-18 ENCOUNTER — Encounter: Payer: Self-pay | Admitting: Genetic Counselor

## 2016-03-18 DIAGNOSIS — G894 Chronic pain syndrome: Secondary | ICD-10-CM | POA: Diagnosis not present

## 2016-03-18 DIAGNOSIS — Z90710 Acquired absence of both cervix and uterus: Secondary | ICD-10-CM | POA: Diagnosis not present

## 2016-03-18 DIAGNOSIS — E7800 Pure hypercholesterolemia, unspecified: Secondary | ICD-10-CM | POA: Diagnosis not present

## 2016-03-18 DIAGNOSIS — E559 Vitamin D deficiency, unspecified: Secondary | ICD-10-CM | POA: Diagnosis not present

## 2016-03-18 DIAGNOSIS — R928 Other abnormal and inconclusive findings on diagnostic imaging of breast: Secondary | ICD-10-CM | POA: Diagnosis not present

## 2016-03-18 DIAGNOSIS — J301 Allergic rhinitis due to pollen: Secondary | ICD-10-CM | POA: Diagnosis not present

## 2016-03-18 DIAGNOSIS — R7309 Other abnormal glucose: Secondary | ICD-10-CM | POA: Diagnosis not present

## 2016-03-18 DIAGNOSIS — K210 Gastro-esophageal reflux disease with esophagitis: Secondary | ICD-10-CM | POA: Diagnosis not present

## 2016-03-18 DIAGNOSIS — R2231 Localized swelling, mass and lump, right upper limb: Secondary | ICD-10-CM | POA: Diagnosis not present

## 2016-03-18 DIAGNOSIS — Z79890 Hormone replacement therapy (postmenopausal): Secondary | ICD-10-CM | POA: Diagnosis not present

## 2016-03-18 DIAGNOSIS — Z0000 Encounter for general adult medical examination without abnormal findings: Secondary | ICD-10-CM | POA: Diagnosis not present

## 2016-03-18 DIAGNOSIS — E8940 Asymptomatic postprocedural ovarian failure: Secondary | ICD-10-CM | POA: Diagnosis not present

## 2016-03-18 DIAGNOSIS — Z131 Encounter for screening for diabetes mellitus: Secondary | ICD-10-CM | POA: Diagnosis not present

## 2016-03-21 DIAGNOSIS — M25561 Pain in right knee: Secondary | ICD-10-CM | POA: Diagnosis not present

## 2016-04-13 ENCOUNTER — Encounter: Payer: Self-pay | Admitting: Genetic Counselor

## 2016-04-13 ENCOUNTER — Ambulatory Visit (HOSPITAL_BASED_OUTPATIENT_CLINIC_OR_DEPARTMENT_OTHER): Payer: Medicare Other | Admitting: Genetic Counselor

## 2016-04-13 ENCOUNTER — Other Ambulatory Visit: Payer: Medicare Other

## 2016-04-13 DIAGNOSIS — Z803 Family history of malignant neoplasm of breast: Secondary | ICD-10-CM | POA: Diagnosis not present

## 2016-04-13 DIAGNOSIS — Z8042 Family history of malignant neoplasm of prostate: Secondary | ICD-10-CM | POA: Diagnosis not present

## 2016-04-13 DIAGNOSIS — Z315 Encounter for genetic counseling: Secondary | ICD-10-CM

## 2016-04-13 NOTE — Progress Notes (Signed)
REFERRING PROVIDER: Leeroy Cha, MD [address] [place], [place] [address]   Thurnell Lose, MD  PRIMARY PROVIDER:  Leeroy Cha, MD  PRIMARY REASON FOR VISIT:  1. Family history of breast cancer   2. Family history of prostate cancer      HISTORY OF PRESENT ILLNESS:   Ms. Shockley, a 55 y.o. female, was seen for a [facility] cancer genetics consultation at the request of Dr. Fara Olden due to a family history of cancer.  Ms. Mcculley presents to clinic today to discuss the possibility of a hereditary predisposition to cancer, genetic testing, and to further clarify her future cancer risks, as well as potential cancer risks for family members. Ms. Hurtubise is a 55 y.o. female with no personal history of cancer.  Ms. Vastine sister is currently 32 and is going through chemotherapy for her ER+/PR+/Her2- breast cancer.  CANCER HISTORY:   No history exists.     HORMONAL RISK FACTORS:  Menarche was at age 55.  First live birth at age 68.  OCP use for approximately 10 years.  Ovaries intact: no.  Hysterectomy: yes.  Menopausal status: postmenopausal.  HRT use: 6 years. Colonoscopy: yes; normal. Mammogram within the last year: yes. Number of breast biopsies: 0. Up to date with pelvic exams:  yes. Any excessive radiation exposure in the past:  no  Past Medical History:  Diagnosis Date  . Family history of breast cancer   . Family history of prostate cancer     History reviewed. No pertinent surgical history.  Social History   Social History  . Marital status: Single    Spouse name: N/A  . Number of children: 1  . Years of education: N/A   Social History Main Topics  . Smoking status: Never Smoker  . Smokeless tobacco: Never Used  . Alcohol use Yes     Comment: socially  . Drug use: No  . Sexual activity: Not Asked   Other Topics Concern  . None   Social History Narrative  . None     FAMILY HISTORY:  We obtained a detailed,  4-generation family history.  Significant diagnoses are listed below: Family History  Problem Relation Age of Onset  . Ulcers Mother 49    peptic ulcer  . Prostate cancer Father 91  . Breast cancer Sister 30    ER+/PR+/Her2- breast cancer  . Breast cancer Paternal Grandmother   . Diabetes Paternal Grandmother   . Cancer Paternal Uncle     unknown cancers    The patient has one daughter who is cancer free.  She has one maternal half sister and a paternal half sister and brother.  The paternal half sister was diagnosed with ER+ breast cancer.  The patient's mother died at 82 from a perforated peptic ulcer.  She had a twin brother who died young and a sister who may still be alive, but the patient does not know.  Her maternal grandparents are deceased.    The patient's father died of prostate cancer.  He had three brothers and a sister.  Two brothers died of an unknown cancer.  The patient's paternal grandmother died of breast cancer.  Patient's maternal ancestors are of [place] descent, and paternal ancestors are of [place] descent. There is no reported Ashkenazi Jewish ancestry. There is no known consanguinity.  GENETIC COUNSELING ASSESSMENT: Nandika Stetzer is a 55 y.o. female with a family history of breast and prostate cancer which is somewhat suggestive of a hereditary cancer  syndrome and predisposition to cancer. We, therefore, discussed and recommended the following at today's visit.   DISCUSSION: We discussed that about 5-10% of breast cancer is hereditary with most cases due to BRCA mutations.  We discussed with Ms. Rounds that the family history is not highly consistent with a familial hereditary cancer syndrome, and we feel she is at low risk to harbor a gene mutation associated with such a condition. Thus, we did not recommend any genetic testing, at this time, and recommended Ms. Bonser continue to follow the cancer screening guidelines given by her primary healthcare provider.  We  talked about having her look into whether her sister could get testing  If her sister underwent genetic testing then we could look into testing Ms. Mcneece for what her sister tested positive for.  We did discuss that if she is personally concerned about her risk for a hereditary mutation, that there are relatively low out of pocket cost testing that can be performed.  We reviewed that [facility] has a $250 test and [facility] has a $260 test.    PLAN: Ms. Abboud will talk with her sister to see if she is willing to undergo genetic testing.  She will also think about whether she wants to pursue genetic testing through a self pay program.  Lastly, we encouraged Ms. Los to remain in contact with cancer genetics annually so that we can continuously update the family history and inform her of any changes in cancer genetics and testing that may be of benefit for this family.   Ms.  Tankard questions were answered to her satisfaction today. Our contact information was provided should additional questions or concerns arise. Thank you for the referral and allowing [place] to share in the care of your patient.   Karen P. Florene Glen, [place], [facility] Certified Genetic Counselor [email or web address] phone: [phone]  The patient was seen for a total of 45 minutes in face-to-face genetic counseling.  This patient was discussed with Drs. Magrinat, Lindi Adie and/or Burr Medico who agrees with the above.    _______________________________________________________________________ For Office Staff:  Number of people involved in session: 1 Was an Intern/ student involved with case: yes [employer]

## 2016-04-14 DIAGNOSIS — G894 Chronic pain syndrome: Secondary | ICD-10-CM | POA: Diagnosis not present

## 2016-04-14 DIAGNOSIS — M25519 Pain in unspecified shoulder: Secondary | ICD-10-CM | POA: Diagnosis not present

## 2016-04-14 DIAGNOSIS — M545 Low back pain: Secondary | ICD-10-CM | POA: Diagnosis not present

## 2016-04-14 DIAGNOSIS — Z79891 Long term (current) use of opiate analgesic: Secondary | ICD-10-CM | POA: Diagnosis not present

## 2016-04-14 DIAGNOSIS — R51 Headache: Secondary | ICD-10-CM | POA: Diagnosis not present

## 2016-04-14 DIAGNOSIS — Z79899 Other long term (current) drug therapy: Secondary | ICD-10-CM | POA: Diagnosis not present

## 2016-04-15 ENCOUNTER — Ambulatory Visit
Admission: RE | Admit: 2016-04-15 | Discharge: 2016-04-15 | Disposition: A | Discharge status: Home / Self Care | Payer: Medicare Other | Source: Ambulatory Visit | Attending: Obstetrics and Gynecology | Admitting: Obstetrics and Gynecology

## 2016-04-15 ENCOUNTER — Other Ambulatory Visit: Payer: Self-pay | Admitting: Obstetrics and Gynecology

## 2016-04-15 DIAGNOSIS — R921 Mammographic calcification found on diagnostic imaging of breast: Secondary | ICD-10-CM | POA: Diagnosis not present

## 2016-04-22 DIAGNOSIS — Z96651 Presence of right artificial knee joint: Secondary | ICD-10-CM | POA: Diagnosis not present

## 2016-04-22 DIAGNOSIS — M25661 Stiffness of right knee, not elsewhere classified: Secondary | ICD-10-CM | POA: Diagnosis not present

## 2016-04-22 DIAGNOSIS — M1711 Unilateral primary osteoarthritis, right knee: Secondary | ICD-10-CM | POA: Diagnosis not present

## 2016-04-22 DIAGNOSIS — M25561 Pain in right knee: Secondary | ICD-10-CM | POA: Diagnosis not present

## 2016-04-26 DIAGNOSIS — M1711 Unilateral primary osteoarthritis, right knee: Secondary | ICD-10-CM | POA: Diagnosis not present

## 2016-04-26 DIAGNOSIS — M25661 Stiffness of right knee, not elsewhere classified: Secondary | ICD-10-CM | POA: Diagnosis not present

## 2016-04-26 DIAGNOSIS — M25561 Pain in right knee: Secondary | ICD-10-CM | POA: Diagnosis not present

## 2016-04-26 DIAGNOSIS — Z96651 Presence of right artificial knee joint: Secondary | ICD-10-CM | POA: Diagnosis not present

## 2016-04-27 DIAGNOSIS — M542 Cervicalgia: Secondary | ICD-10-CM | POA: Diagnosis not present

## 2016-04-27 DIAGNOSIS — R51 Headache: Secondary | ICD-10-CM | POA: Diagnosis not present

## 2016-04-28 DIAGNOSIS — Z96651 Presence of right artificial knee joint: Secondary | ICD-10-CM | POA: Diagnosis not present

## 2016-04-28 DIAGNOSIS — M25661 Stiffness of right knee, not elsewhere classified: Secondary | ICD-10-CM | POA: Diagnosis not present

## 2016-04-28 DIAGNOSIS — M25561 Pain in right knee: Secondary | ICD-10-CM | POA: Diagnosis not present

## 2016-04-28 DIAGNOSIS — M1711 Unilateral primary osteoarthritis, right knee: Secondary | ICD-10-CM | POA: Diagnosis not present

## 2016-05-03 DIAGNOSIS — M1711 Unilateral primary osteoarthritis, right knee: Secondary | ICD-10-CM | POA: Diagnosis not present

## 2016-05-03 DIAGNOSIS — Z96651 Presence of right artificial knee joint: Secondary | ICD-10-CM | POA: Diagnosis not present

## 2016-05-03 DIAGNOSIS — M25561 Pain in right knee: Secondary | ICD-10-CM | POA: Diagnosis not present

## 2016-05-03 DIAGNOSIS — M25661 Stiffness of right knee, not elsewhere classified: Secondary | ICD-10-CM | POA: Diagnosis not present

## 2016-05-05 DIAGNOSIS — Z96651 Presence of right artificial knee joint: Secondary | ICD-10-CM | POA: Diagnosis not present

## 2016-05-05 DIAGNOSIS — M1711 Unilateral primary osteoarthritis, right knee: Secondary | ICD-10-CM | POA: Diagnosis not present

## 2016-05-05 DIAGNOSIS — M25661 Stiffness of right knee, not elsewhere classified: Secondary | ICD-10-CM | POA: Diagnosis not present

## 2016-05-05 DIAGNOSIS — M25561 Pain in right knee: Secondary | ICD-10-CM | POA: Diagnosis not present

## 2016-05-12 DIAGNOSIS — J209 Acute bronchitis, unspecified: Secondary | ICD-10-CM | POA: Diagnosis not present

## 2016-05-13 DIAGNOSIS — M1712 Unilateral primary osteoarthritis, left knee: Secondary | ICD-10-CM | POA: Diagnosis not present

## 2016-05-16 DIAGNOSIS — M25569 Pain in unspecified knee: Secondary | ICD-10-CM | POA: Diagnosis not present

## 2016-05-16 DIAGNOSIS — R51 Headache: Secondary | ICD-10-CM | POA: Diagnosis not present

## 2016-05-16 DIAGNOSIS — M542 Cervicalgia: Secondary | ICD-10-CM | POA: Diagnosis not present

## 2016-05-16 DIAGNOSIS — G894 Chronic pain syndrome: Secondary | ICD-10-CM | POA: Diagnosis not present

## 2016-05-16 DIAGNOSIS — Z79891 Long term (current) use of opiate analgesic: Secondary | ICD-10-CM | POA: Diagnosis not present

## 2016-05-16 DIAGNOSIS — Z79899 Other long term (current) drug therapy: Secondary | ICD-10-CM | POA: Diagnosis not present

## 2016-05-18 DIAGNOSIS — E785 Hyperlipidemia, unspecified: Secondary | ICD-10-CM | POA: Diagnosis not present

## 2016-05-18 DIAGNOSIS — G500 Trigeminal neuralgia: Secondary | ICD-10-CM | POA: Diagnosis not present

## 2016-05-18 DIAGNOSIS — K210 Gastro-esophageal reflux disease with esophagitis: Secondary | ICD-10-CM | POA: Diagnosis not present

## 2016-05-18 DIAGNOSIS — M179 Osteoarthritis of knee, unspecified: Secondary | ICD-10-CM | POA: Diagnosis not present

## 2016-05-18 DIAGNOSIS — J42 Unspecified chronic bronchitis: Secondary | ICD-10-CM | POA: Diagnosis not present

## 2016-05-18 DIAGNOSIS — M7989 Other specified soft tissue disorders: Secondary | ICD-10-CM | POA: Diagnosis not present

## 2016-05-18 DIAGNOSIS — K5900 Constipation, unspecified: Secondary | ICD-10-CM | POA: Diagnosis not present

## 2016-06-02 DIAGNOSIS — M24661 Ankylosis, right knee: Secondary | ICD-10-CM | POA: Diagnosis not present

## 2016-06-03 DIAGNOSIS — M25661 Stiffness of right knee, not elsewhere classified: Secondary | ICD-10-CM | POA: Diagnosis not present

## 2016-06-03 DIAGNOSIS — M25561 Pain in right knee: Secondary | ICD-10-CM | POA: Diagnosis not present

## 2016-06-03 DIAGNOSIS — Z96651 Presence of right artificial knee joint: Secondary | ICD-10-CM | POA: Diagnosis not present

## 2016-06-07 DIAGNOSIS — M25561 Pain in right knee: Secondary | ICD-10-CM | POA: Diagnosis not present

## 2016-06-07 DIAGNOSIS — Z96651 Presence of right artificial knee joint: Secondary | ICD-10-CM | POA: Diagnosis not present

## 2016-06-07 DIAGNOSIS — M25661 Stiffness of right knee, not elsewhere classified: Secondary | ICD-10-CM | POA: Diagnosis not present

## 2016-06-08 DIAGNOSIS — M25561 Pain in right knee: Secondary | ICD-10-CM | POA: Diagnosis not present

## 2016-06-08 DIAGNOSIS — M25661 Stiffness of right knee, not elsewhere classified: Secondary | ICD-10-CM | POA: Diagnosis not present

## 2016-06-08 DIAGNOSIS — Z96651 Presence of right artificial knee joint: Secondary | ICD-10-CM | POA: Diagnosis not present

## 2016-06-09 DIAGNOSIS — M25661 Stiffness of right knee, not elsewhere classified: Secondary | ICD-10-CM | POA: Diagnosis not present

## 2016-06-09 DIAGNOSIS — Z96651 Presence of right artificial knee joint: Secondary | ICD-10-CM | POA: Diagnosis not present

## 2016-06-09 DIAGNOSIS — M25561 Pain in right knee: Secondary | ICD-10-CM | POA: Diagnosis not present

## 2016-06-10 DIAGNOSIS — M25661 Stiffness of right knee, not elsewhere classified: Secondary | ICD-10-CM | POA: Diagnosis not present

## 2016-06-10 DIAGNOSIS — A6004 Herpesviral vulvovaginitis: Secondary | ICD-10-CM | POA: Diagnosis not present

## 2016-06-10 DIAGNOSIS — N951 Menopausal and female climacteric states: Secondary | ICD-10-CM | POA: Diagnosis not present

## 2016-06-10 DIAGNOSIS — Z96651 Presence of right artificial knee joint: Secondary | ICD-10-CM | POA: Diagnosis not present

## 2016-06-10 DIAGNOSIS — M25561 Pain in right knee: Secondary | ICD-10-CM | POA: Diagnosis not present

## 2016-06-13 DIAGNOSIS — M25661 Stiffness of right knee, not elsewhere classified: Secondary | ICD-10-CM | POA: Diagnosis not present

## 2016-06-13 DIAGNOSIS — Z96651 Presence of right artificial knee joint: Secondary | ICD-10-CM | POA: Diagnosis not present

## 2016-06-13 DIAGNOSIS — M25561 Pain in right knee: Secondary | ICD-10-CM | POA: Diagnosis not present

## 2016-06-15 DIAGNOSIS — Z96651 Presence of right artificial knee joint: Secondary | ICD-10-CM | POA: Diagnosis not present

## 2016-06-15 DIAGNOSIS — M25561 Pain in right knee: Secondary | ICD-10-CM | POA: Diagnosis not present

## 2016-06-15 DIAGNOSIS — M25661 Stiffness of right knee, not elsewhere classified: Secondary | ICD-10-CM | POA: Diagnosis not present

## 2016-06-16 DIAGNOSIS — Z96651 Presence of right artificial knee joint: Secondary | ICD-10-CM | POA: Diagnosis not present

## 2016-06-16 DIAGNOSIS — M25661 Stiffness of right knee, not elsewhere classified: Secondary | ICD-10-CM | POA: Diagnosis not present

## 2016-06-16 DIAGNOSIS — M25561 Pain in right knee: Secondary | ICD-10-CM | POA: Diagnosis not present

## 2016-06-20 DIAGNOSIS — R51 Headache: Secondary | ICD-10-CM | POA: Diagnosis not present

## 2016-06-20 DIAGNOSIS — M25569 Pain in unspecified knee: Secondary | ICD-10-CM | POA: Diagnosis not present

## 2016-06-20 DIAGNOSIS — M25561 Pain in right knee: Secondary | ICD-10-CM | POA: Diagnosis not present

## 2016-06-20 DIAGNOSIS — G894 Chronic pain syndrome: Secondary | ICD-10-CM | POA: Diagnosis not present

## 2016-06-20 DIAGNOSIS — M25661 Stiffness of right knee, not elsewhere classified: Secondary | ICD-10-CM | POA: Diagnosis not present

## 2016-06-20 DIAGNOSIS — Z96651 Presence of right artificial knee joint: Secondary | ICD-10-CM | POA: Diagnosis not present

## 2016-06-20 DIAGNOSIS — M545 Low back pain: Secondary | ICD-10-CM | POA: Diagnosis not present

## 2016-06-22 DIAGNOSIS — M25661 Stiffness of right knee, not elsewhere classified: Secondary | ICD-10-CM | POA: Diagnosis not present

## 2016-06-22 DIAGNOSIS — Z96651 Presence of right artificial knee joint: Secondary | ICD-10-CM | POA: Diagnosis not present

## 2016-06-22 DIAGNOSIS — M25561 Pain in right knee: Secondary | ICD-10-CM | POA: Diagnosis not present

## 2016-06-28 DIAGNOSIS — Z96651 Presence of right artificial knee joint: Secondary | ICD-10-CM | POA: Diagnosis not present

## 2016-06-28 DIAGNOSIS — M25561 Pain in right knee: Secondary | ICD-10-CM | POA: Diagnosis not present

## 2016-06-28 DIAGNOSIS — M25661 Stiffness of right knee, not elsewhere classified: Secondary | ICD-10-CM | POA: Diagnosis not present

## 2016-06-29 DIAGNOSIS — M47817 Spondylosis without myelopathy or radiculopathy, lumbosacral region: Secondary | ICD-10-CM | POA: Diagnosis not present

## 2016-07-06 DIAGNOSIS — M25561 Pain in right knee: Secondary | ICD-10-CM | POA: Diagnosis not present

## 2016-07-06 DIAGNOSIS — M25661 Stiffness of right knee, not elsewhere classified: Secondary | ICD-10-CM | POA: Diagnosis not present

## 2016-07-06 DIAGNOSIS — Z96651 Presence of right artificial knee joint: Secondary | ICD-10-CM | POA: Diagnosis not present

## 2016-07-18 DIAGNOSIS — Z79899 Other long term (current) drug therapy: Secondary | ICD-10-CM | POA: Diagnosis not present

## 2016-07-18 DIAGNOSIS — R51 Headache: Secondary | ICD-10-CM | POA: Diagnosis not present

## 2016-07-18 DIAGNOSIS — G894 Chronic pain syndrome: Secondary | ICD-10-CM | POA: Diagnosis not present

## 2016-07-18 DIAGNOSIS — M542 Cervicalgia: Secondary | ICD-10-CM | POA: Diagnosis not present

## 2016-07-18 DIAGNOSIS — Z79891 Long term (current) use of opiate analgesic: Secondary | ICD-10-CM | POA: Diagnosis not present

## 2016-07-20 DIAGNOSIS — Z96651 Presence of right artificial knee joint: Secondary | ICD-10-CM | POA: Diagnosis not present

## 2016-07-20 DIAGNOSIS — M25661 Stiffness of right knee, not elsewhere classified: Secondary | ICD-10-CM | POA: Diagnosis not present

## 2016-07-20 DIAGNOSIS — M25561 Pain in right knee: Secondary | ICD-10-CM | POA: Diagnosis not present

## 2016-07-22 DIAGNOSIS — M545 Low back pain: Secondary | ICD-10-CM | POA: Diagnosis not present

## 2016-07-22 DIAGNOSIS — M7061 Trochanteric bursitis, right hip: Secondary | ICD-10-CM | POA: Diagnosis not present

## 2016-08-04 DIAGNOSIS — F4325 Adjustment disorder with mixed disturbance of emotions and conduct: Secondary | ICD-10-CM | POA: Diagnosis not present

## 2016-08-08 DIAGNOSIS — K8020 Calculus of gallbladder without cholecystitis without obstruction: Secondary | ICD-10-CM

## 2016-08-08 DIAGNOSIS — J189 Pneumonia, unspecified organism: Secondary | ICD-10-CM

## 2016-08-08 HISTORY — DX: Calculus of gallbladder without cholecystitis without obstruction: K80.20

## 2016-08-08 HISTORY — DX: Pneumonia, unspecified organism: J18.9

## 2016-08-09 DIAGNOSIS — R300 Dysuria: Secondary | ICD-10-CM | POA: Diagnosis not present

## 2016-08-26 DIAGNOSIS — M542 Cervicalgia: Secondary | ICD-10-CM | POA: Diagnosis not present

## 2016-08-26 DIAGNOSIS — M25511 Pain in right shoulder: Secondary | ICD-10-CM | POA: Diagnosis not present

## 2016-08-29 DIAGNOSIS — M4696 Unspecified inflammatory spondylopathy, lumbar region: Secondary | ICD-10-CM | POA: Diagnosis not present

## 2016-08-29 DIAGNOSIS — M47817 Spondylosis without myelopathy or radiculopathy, lumbosacral region: Secondary | ICD-10-CM | POA: Diagnosis not present

## 2016-08-29 DIAGNOSIS — G894 Chronic pain syndrome: Secondary | ICD-10-CM | POA: Diagnosis not present

## 2016-08-29 DIAGNOSIS — Z79899 Other long term (current) drug therapy: Secondary | ICD-10-CM | POA: Diagnosis not present

## 2016-08-29 DIAGNOSIS — Z79891 Long term (current) use of opiate analgesic: Secondary | ICD-10-CM | POA: Diagnosis not present

## 2016-08-29 DIAGNOSIS — M5137 Other intervertebral disc degeneration, lumbosacral region: Secondary | ICD-10-CM | POA: Diagnosis not present

## 2016-08-30 DIAGNOSIS — R10817 Generalized abdominal tenderness: Secondary | ICD-10-CM | POA: Diagnosis not present

## 2016-08-30 DIAGNOSIS — K219 Gastro-esophageal reflux disease without esophagitis: Secondary | ICD-10-CM | POA: Diagnosis not present

## 2016-08-30 DIAGNOSIS — K5900 Constipation, unspecified: Secondary | ICD-10-CM | POA: Diagnosis not present

## 2016-08-30 DIAGNOSIS — F4325 Adjustment disorder with mixed disturbance of emotions and conduct: Secondary | ICD-10-CM | POA: Diagnosis not present

## 2016-09-01 ENCOUNTER — Other Ambulatory Visit: Payer: Self-pay | Admitting: Obstetrics and Gynecology

## 2016-09-01 DIAGNOSIS — Z1231 Encounter for screening mammogram for malignant neoplasm of breast: Secondary | ICD-10-CM

## 2016-09-03 DIAGNOSIS — J111 Influenza due to unidentified influenza virus with other respiratory manifestations: Secondary | ICD-10-CM | POA: Diagnosis not present

## 2016-09-12 DIAGNOSIS — J209 Acute bronchitis, unspecified: Secondary | ICD-10-CM | POA: Diagnosis not present

## 2016-09-16 DIAGNOSIS — M25511 Pain in right shoulder: Secondary | ICD-10-CM | POA: Diagnosis not present

## 2016-09-19 DIAGNOSIS — J42 Unspecified chronic bronchitis: Secondary | ICD-10-CM | POA: Diagnosis not present

## 2016-09-19 DIAGNOSIS — K210 Gastro-esophageal reflux disease with esophagitis: Secondary | ICD-10-CM | POA: Diagnosis not present

## 2016-09-19 DIAGNOSIS — E785 Hyperlipidemia, unspecified: Secondary | ICD-10-CM | POA: Diagnosis not present

## 2016-09-19 DIAGNOSIS — Z79899 Other long term (current) drug therapy: Secondary | ICD-10-CM | POA: Diagnosis not present

## 2016-09-19 DIAGNOSIS — G894 Chronic pain syndrome: Secondary | ICD-10-CM | POA: Diagnosis not present

## 2016-09-19 DIAGNOSIS — M25569 Pain in unspecified knee: Secondary | ICD-10-CM | POA: Diagnosis not present

## 2016-09-19 DIAGNOSIS — K5900 Constipation, unspecified: Secondary | ICD-10-CM | POA: Diagnosis not present

## 2016-09-19 DIAGNOSIS — R110 Nausea: Secondary | ICD-10-CM | POA: Diagnosis not present

## 2016-09-20 DIAGNOSIS — G5631 Lesion of radial nerve, right upper limb: Secondary | ICD-10-CM | POA: Diagnosis not present

## 2016-09-21 ENCOUNTER — Encounter: Payer: Self-pay | Admitting: Gastroenterology

## 2016-09-21 DIAGNOSIS — K640 First degree hemorrhoids: Secondary | ICD-10-CM | POA: Diagnosis not present

## 2016-09-21 DIAGNOSIS — R12 Heartburn: Secondary | ICD-10-CM | POA: Diagnosis not present

## 2016-09-21 DIAGNOSIS — K219 Gastro-esophageal reflux disease without esophagitis: Secondary | ICD-10-CM | POA: Diagnosis not present

## 2016-09-21 DIAGNOSIS — K2900 Acute gastritis without bleeding: Secondary | ICD-10-CM | POA: Diagnosis not present

## 2016-09-21 DIAGNOSIS — R1084 Generalized abdominal pain: Secondary | ICD-10-CM | POA: Diagnosis not present

## 2016-09-21 DIAGNOSIS — K2930 Chronic superficial gastritis without bleeding: Secondary | ICD-10-CM | POA: Diagnosis not present

## 2016-09-21 DIAGNOSIS — D126 Benign neoplasm of colon, unspecified: Secondary | ICD-10-CM | POA: Diagnosis not present

## 2016-09-21 DIAGNOSIS — K449 Diaphragmatic hernia without obstruction or gangrene: Secondary | ICD-10-CM | POA: Diagnosis not present

## 2016-09-23 DIAGNOSIS — M542 Cervicalgia: Secondary | ICD-10-CM | POA: Diagnosis not present

## 2016-09-23 DIAGNOSIS — M25511 Pain in right shoulder: Secondary | ICD-10-CM | POA: Diagnosis not present

## 2016-09-26 ENCOUNTER — Ambulatory Visit
Admission: RE | Admit: 2016-09-26 | Discharge: 2016-09-26 | Disposition: A | Discharge status: Home / Self Care | Payer: Medicare Other | Source: Ambulatory Visit | Attending: Obstetrics and Gynecology | Admitting: Obstetrics and Gynecology

## 2016-09-26 DIAGNOSIS — Z1231 Encounter for screening mammogram for malignant neoplasm of breast: Secondary | ICD-10-CM | POA: Diagnosis not present

## 2016-09-27 ENCOUNTER — Other Ambulatory Visit: Payer: Self-pay | Admitting: Family Medicine

## 2016-09-27 DIAGNOSIS — K2900 Acute gastritis without bleeding: Secondary | ICD-10-CM | POA: Diagnosis not present

## 2016-09-27 DIAGNOSIS — M25511 Pain in right shoulder: Secondary | ICD-10-CM | POA: Diagnosis not present

## 2016-09-27 DIAGNOSIS — R2231 Localized swelling, mass and lump, right upper limb: Secondary | ICD-10-CM

## 2016-09-27 DIAGNOSIS — D126 Benign neoplasm of colon, unspecified: Secondary | ICD-10-CM | POA: Diagnosis not present

## 2016-09-28 DIAGNOSIS — F4325 Adjustment disorder with mixed disturbance of emotions and conduct: Secondary | ICD-10-CM | POA: Diagnosis not present

## 2016-09-28 DIAGNOSIS — M542 Cervicalgia: Secondary | ICD-10-CM | POA: Diagnosis not present

## 2016-09-28 DIAGNOSIS — Z79891 Long term (current) use of opiate analgesic: Secondary | ICD-10-CM | POA: Diagnosis not present

## 2016-09-28 DIAGNOSIS — G894 Chronic pain syndrome: Secondary | ICD-10-CM | POA: Diagnosis not present

## 2016-09-28 DIAGNOSIS — Z79899 Other long term (current) drug therapy: Secondary | ICD-10-CM | POA: Diagnosis not present

## 2016-09-28 DIAGNOSIS — R51 Headache: Secondary | ICD-10-CM | POA: Diagnosis not present

## 2016-09-30 DIAGNOSIS — R1031 Right lower quadrant pain: Secondary | ICD-10-CM | POA: Diagnosis not present

## 2016-09-30 DIAGNOSIS — D122 Benign neoplasm of ascending colon: Secondary | ICD-10-CM | POA: Diagnosis not present

## 2016-09-30 DIAGNOSIS — R10813 Right lower quadrant abdominal tenderness: Secondary | ICD-10-CM | POA: Diagnosis not present

## 2016-10-03 ENCOUNTER — Other Ambulatory Visit: Payer: Self-pay | Admitting: Gastroenterology

## 2016-10-03 DIAGNOSIS — R1084 Generalized abdominal pain: Secondary | ICD-10-CM

## 2016-10-03 DIAGNOSIS — R1031 Right lower quadrant pain: Secondary | ICD-10-CM

## 2016-10-04 ENCOUNTER — Ambulatory Visit
Admission: RE | Admit: 2016-10-04 | Discharge: 2016-10-04 | Disposition: A | Discharge status: Home / Self Care | Payer: Medicare Other | Source: Ambulatory Visit | Attending: Gastroenterology | Admitting: Gastroenterology

## 2016-10-04 DIAGNOSIS — R1031 Right lower quadrant pain: Secondary | ICD-10-CM

## 2016-10-04 DIAGNOSIS — R1084 Generalized abdominal pain: Secondary | ICD-10-CM

## 2016-10-04 DIAGNOSIS — R109 Unspecified abdominal pain: Secondary | ICD-10-CM | POA: Diagnosis not present

## 2016-10-04 MED ORDER — IOPAMIDOL (ISOVUE-300) INJECTION 61%
100.0000 mL | Freq: Once | INTRAVENOUS | Status: AC | PRN
  Administered 2016-10-04: 100 mL via INTRAVENOUS

## 2016-10-05 ENCOUNTER — Inpatient Hospital Stay
Admission: RE | Admit: 2016-10-05 | Discharge: 2016-10-05 | Disposition: A | Discharge status: Home / Self Care | Payer: Medicare Other | Source: Ambulatory Visit | Attending: Family Medicine | Admitting: Family Medicine

## 2016-10-05 ENCOUNTER — Ambulatory Visit
Admission: RE | Admit: 2016-10-05 | Discharge: 2016-10-05 | Disposition: A | Discharge status: Home / Self Care | Payer: Medicare Other | Source: Ambulatory Visit | Attending: Family Medicine | Admitting: Family Medicine

## 2016-10-05 DIAGNOSIS — R2231 Localized swelling, mass and lump, right upper limb: Secondary | ICD-10-CM | POA: Diagnosis not present

## 2016-10-05 MED ORDER — GADOBENATE DIMEGLUMINE 529 MG/ML IV SOLN
13.0000 mL | Freq: Once | INTRAVENOUS | Status: AC | PRN
  Administered 2016-10-05: 13 mL via INTRAVENOUS

## 2016-10-07 ENCOUNTER — Other Ambulatory Visit: Payer: Self-pay | Admitting: Gastroenterology

## 2016-10-07 DIAGNOSIS — G589 Mononeuropathy, unspecified: Secondary | ICD-10-CM | POA: Diagnosis not present

## 2016-10-07 DIAGNOSIS — K219 Gastro-esophageal reflux disease without esophagitis: Secondary | ICD-10-CM | POA: Diagnosis not present

## 2016-10-07 DIAGNOSIS — R10811 Right upper quadrant abdominal tenderness: Secondary | ICD-10-CM | POA: Diagnosis not present

## 2016-10-11 ENCOUNTER — Ambulatory Visit
Admission: RE | Admit: 2016-10-11 | Discharge: 2016-10-11 | Disposition: A | Discharge status: Home / Self Care | Payer: Medicare Other | Source: Ambulatory Visit | Attending: Gastroenterology | Admitting: Gastroenterology

## 2016-10-11 DIAGNOSIS — R10811 Right upper quadrant abdominal tenderness: Secondary | ICD-10-CM

## 2016-10-11 DIAGNOSIS — R1011 Right upper quadrant pain: Secondary | ICD-10-CM | POA: Diagnosis not present

## 2016-10-13 DIAGNOSIS — F4325 Adjustment disorder with mixed disturbance of emotions and conduct: Secondary | ICD-10-CM | POA: Diagnosis not present

## 2016-10-14 ENCOUNTER — Other Ambulatory Visit: Payer: Medicare Other

## 2016-10-17 DIAGNOSIS — M25511 Pain in right shoulder: Secondary | ICD-10-CM | POA: Diagnosis not present

## 2016-10-26 DIAGNOSIS — M47817 Spondylosis without myelopathy or radiculopathy, lumbosacral region: Secondary | ICD-10-CM | POA: Diagnosis not present

## 2016-10-26 DIAGNOSIS — M4696 Unspecified inflammatory spondylopathy, lumbar region: Secondary | ICD-10-CM | POA: Diagnosis not present

## 2016-10-26 DIAGNOSIS — G894 Chronic pain syndrome: Secondary | ICD-10-CM | POA: Diagnosis not present

## 2016-10-26 DIAGNOSIS — Z79891 Long term (current) use of opiate analgesic: Secondary | ICD-10-CM | POA: Diagnosis not present

## 2016-10-26 DIAGNOSIS — Z79899 Other long term (current) drug therapy: Secondary | ICD-10-CM | POA: Diagnosis not present

## 2016-10-26 DIAGNOSIS — M5137 Other intervertebral disc degeneration, lumbosacral region: Secondary | ICD-10-CM | POA: Diagnosis not present

## 2016-11-03 DIAGNOSIS — F4325 Adjustment disorder with mixed disturbance of emotions and conduct: Secondary | ICD-10-CM | POA: Diagnosis not present

## 2016-11-07 ENCOUNTER — Other Ambulatory Visit: Payer: Self-pay | Admitting: Pain Medicine

## 2016-11-07 DIAGNOSIS — M545 Low back pain: Secondary | ICD-10-CM

## 2016-11-07 DIAGNOSIS — G894 Chronic pain syndrome: Secondary | ICD-10-CM | POA: Diagnosis not present

## 2016-11-07 DIAGNOSIS — M961 Postlaminectomy syndrome, not elsewhere classified: Secondary | ICD-10-CM | POA: Diagnosis not present

## 2016-11-07 DIAGNOSIS — M19019 Primary osteoarthritis, unspecified shoulder: Secondary | ICD-10-CM | POA: Diagnosis not present

## 2016-11-08 DIAGNOSIS — M545 Low back pain: Secondary | ICD-10-CM | POA: Diagnosis not present

## 2016-11-08 DIAGNOSIS — M79609 Pain in unspecified limb: Secondary | ICD-10-CM | POA: Diagnosis not present

## 2016-11-14 ENCOUNTER — Ambulatory Visit
Admission: RE | Admit: 2016-11-14 | Discharge: 2016-11-14 | Disposition: A | Discharge status: Home / Self Care | Payer: Medicare Other | Source: Ambulatory Visit | Attending: Pain Medicine | Admitting: Pain Medicine

## 2016-11-14 DIAGNOSIS — M48061 Spinal stenosis, lumbar region without neurogenic claudication: Secondary | ICD-10-CM | POA: Diagnosis not present

## 2016-11-14 DIAGNOSIS — M545 Low back pain: Secondary | ICD-10-CM

## 2016-11-15 DIAGNOSIS — G894 Chronic pain syndrome: Secondary | ICD-10-CM | POA: Diagnosis not present

## 2016-11-15 DIAGNOSIS — M792 Neuralgia and neuritis, unspecified: Secondary | ICD-10-CM | POA: Diagnosis not present

## 2016-11-15 DIAGNOSIS — F4542 Pain disorder with related psychological factors: Secondary | ICD-10-CM | POA: Diagnosis not present

## 2016-11-15 DIAGNOSIS — G609 Hereditary and idiopathic neuropathy, unspecified: Secondary | ICD-10-CM | POA: Diagnosis not present

## 2016-11-16 DIAGNOSIS — G8929 Other chronic pain: Secondary | ICD-10-CM | POA: Diagnosis not present

## 2016-11-16 DIAGNOSIS — K589 Irritable bowel syndrome without diarrhea: Secondary | ICD-10-CM | POA: Diagnosis not present

## 2016-11-16 DIAGNOSIS — M79601 Pain in right arm: Secondary | ICD-10-CM | POA: Diagnosis not present

## 2016-11-16 DIAGNOSIS — Z886 Allergy status to analgesic agent status: Secondary | ICD-10-CM | POA: Diagnosis not present

## 2016-11-16 DIAGNOSIS — E785 Hyperlipidemia, unspecified: Secondary | ICD-10-CM | POA: Diagnosis not present

## 2016-11-16 DIAGNOSIS — Z880 Allergy status to penicillin: Secondary | ICD-10-CM | POA: Diagnosis not present

## 2016-11-16 DIAGNOSIS — M792 Neuralgia and neuritis, unspecified: Secondary | ICD-10-CM | POA: Diagnosis not present

## 2016-11-16 DIAGNOSIS — Z6827 Body mass index (BMI) 27.0-27.9, adult: Secondary | ICD-10-CM | POA: Diagnosis not present

## 2016-11-16 DIAGNOSIS — Z79899 Other long term (current) drug therapy: Secondary | ICD-10-CM | POA: Diagnosis not present

## 2016-11-16 DIAGNOSIS — Z91040 Latex allergy status: Secondary | ICD-10-CM | POA: Diagnosis not present

## 2016-11-18 ENCOUNTER — Other Ambulatory Visit: Payer: Medicare Other

## 2016-11-18 ENCOUNTER — Emergency Department (HOSPITAL_BASED_OUTPATIENT_CLINIC_OR_DEPARTMENT_OTHER)
Admission: EM | Admit: 2016-11-18 | Discharge: 2016-11-19 | Disposition: A | Discharge status: Home / Self Care | Payer: Medicare Other | Attending: Emergency Medicine | Admitting: Emergency Medicine

## 2016-11-18 ENCOUNTER — Encounter (HOSPITAL_BASED_OUTPATIENT_CLINIC_OR_DEPARTMENT_OTHER): Payer: Self-pay | Admitting: *Deleted

## 2016-11-18 DIAGNOSIS — R1011 Right upper quadrant pain: Secondary | ICD-10-CM | POA: Insufficient documentation

## 2016-11-18 DIAGNOSIS — Z79899 Other long term (current) drug therapy: Secondary | ICD-10-CM | POA: Insufficient documentation

## 2016-11-18 DIAGNOSIS — R1032 Left lower quadrant pain: Secondary | ICD-10-CM | POA: Insufficient documentation

## 2016-11-18 HISTORY — DX: Other chronic pain: G89.29

## 2016-11-18 LAB — CBC WITH DIFFERENTIAL/PLATELET
Basophils Absolute: 0 10*3/uL (ref 0.0–0.1)
Basophils Relative: 1 %
Eosinophils Absolute: 0.1 10*3/uL (ref 0.0–0.7)
Eosinophils Relative: 2 %
HCT: 38.2 % (ref 36.0–46.0)
Hemoglobin: 12.9 g/dL (ref 12.0–15.0)
Lymphocytes Relative: 35 %
Lymphs Abs: 2.8 10*3/uL (ref 0.7–4.0)
MCH: 31.9 pg (ref 26.0–34.0)
MCHC: 33.8 g/dL (ref 30.0–36.0)
MCV: 94.3 fL (ref 78.0–100.0)
Monocytes Absolute: 0.6 10*3/uL (ref 0.1–1.0)
Monocytes Relative: 8 %
Neutro Abs: 4.3 10*3/uL (ref 1.7–7.7)
Neutrophils Relative %: 54 %
Platelets: 353 10*3/uL (ref 150–400)
RBC: 4.05 MIL/uL (ref 3.87–5.11)
RDW: 13 % (ref 11.5–15.5)
WBC: 7.8 10*3/uL (ref 4.0–10.5)

## 2016-11-18 LAB — URINALYSIS, ROUTINE W REFLEX MICROSCOPIC
Bilirubin Urine: NEGATIVE
Glucose, UA: NEGATIVE mg/dL
Hgb urine dipstick: NEGATIVE
Ketones, ur: NEGATIVE mg/dL
Leukocytes, UA: NEGATIVE
Nitrite: NEGATIVE
Protein, ur: NEGATIVE mg/dL
Specific Gravity, Urine: 1.02 (ref 1.005–1.030)
pH: 6.5 (ref 5.0–8.0)

## 2016-11-18 MED ORDER — MORPHINE SULFATE (PF) 4 MG/ML IV SOLN
4.0000 mg | Freq: Once | INTRAVENOUS | Status: AC
  Administered 2016-11-18: 4 mg via INTRAVENOUS
  Filled 2016-11-18: qty 1

## 2016-11-18 NOTE — ED Triage Notes (Addendum)
Pt c/o right upper abd pain x 2 months , being seen by GI for same, seen by [facility]  [place] x 2 days ago for same

## 2016-11-18 NOTE — ED Provider Notes (Signed)
[facility] DEPT [facility] Provider Note   CSN: [id number] Arrival date & time: 11/18/16  2022  By signing my name below, I, Jeanell Sparrow, attest that this documentation has been prepared under the direction and in the presence of non-physician practitioner, Armstead Peaks, PA-C. Electronically Signed: Jeanell Sparrow, Scribe. 11/18/2016. 9:45 PM.  History   Chief Complaint Chief Complaint  Patient presents with  . Abdominal Pain   The history is provided by the patient. No language interpreter was used.   HPI Comments: Cassidy Olsen is a 56 y.o. female who presents to the Emergency Department complaining of constant moderate RUQ abdominal pain that started about 2 months ago. She states her pain started after her endoscopy and colonoscopy. She was started on antibiotics and until she had a CAT scan done showing no abnormalities. She later had a RUQ ultrasound Recommending liver function tests for correlation with intrahepatic duct dilatation and heterogeneity and liver texture. Patient does not believe that this was done, because she has not been back to her gastroenterologist. Gastroenterologist told her that her RUQ [place] was normal. She took oxycodone this morning with no relief. She describes the non-radiating pain as a sharp waxing/waning sensation that is worse at night, with movement, talking. Not associated with eating. She reports associated constipation due to her IBS. Denies any fever, nausea, vomiting, diarrhea, or other complaints at this time.      PCP: Leeroy Cha, MD  Past Medical History:  Diagnosis Date  . Chronic pain   . Family history of breast cancer   . Family history of prostate cancer     Patient Active Problem List   Diagnosis Date Noted  . Family history of breast cancer   . Family history of prostate cancer     Past Surgical History:  Procedure Laterality Date  . ABDOMINAL HYSTERECTOMY    . APPENDECTOMY    . BACK SURGERY    . JOINT REPLACEMENT        OB History    No data available       Home Medications    Prior to Admission medications   Medication Sig Start Date End Date Taking? Authorizing Provider  amitriptyline (ELAVIL) 50 MG tablet Take 50 mg by mouth at bedtime.   Yes Historical Provider, MD  baclofen (LIORESAL) 10 MG tablet Take 10 mg by mouth 3 (three) times daily.   Yes Historical Provider, MD  celecoxib (CELEBREX) 200 MG capsule Take 200 mg by mouth 2 (two) times daily.   Yes Historical Provider, MD  dexlansoprazole (DEXILANT) 60 MG capsule Take 60 mg by mouth daily.   Yes Historical Provider, MD  estradiol (CLIMARA - DOSED IN MG/24 HR) 0.05 mg/24hr patch Place 0.05 mg onto the skin once a week.   Yes Historical Provider, MD  lubiprostone (AMITIZA) 24 MCG capsule Take 24 mcg by mouth 2 (two) times daily with a meal.   Yes Historical Provider, MD  oxyCODONE-acetaminophen (PERCOCET) 10-325 MG tablet Take 1 tablet by mouth every 4 (four) hours as needed for pain.   Yes Historical Provider, MD  topiramate (TOPAMAX) 100 MG tablet Take 100 mg by mouth 2 (two) times daily.   Yes Historical Provider, MD  dicyclomine (BENTYL) 20 MG tablet Take 1 tablet (20 mg total) by mouth 2 (two) times daily. 11/19/16   Frederica Kuster, PA-C    Family History Family History  Problem Relation Age of Onset  . Ulcers Mother 5    peptic ulcer  . Prostate cancer  Father 37  . Breast cancer Sister 32    ER+/PR+/Her2- breast cancer  . Breast cancer Paternal Grandmother   . Diabetes Paternal Grandmother   . Cancer Paternal Uncle     unknown cancers    Social History Social History  Substance Use Topics  . Smoking status: Never Smoker  . Smokeless tobacco: Never Used  . Alcohol use Yes     Comment: socially     Allergies   Aspirin; Ibuprofen; Latex; and Penicillins   Review of Systems Review of Systems  Constitutional: Negative for chills and fever.  HENT: Negative for facial swelling and sore throat.   Respiratory: Negative  for shortness of breath.   Cardiovascular: Negative for chest pain.  Gastrointestinal: Positive for abdominal pain (RUQ) and constipation. Negative for diarrhea, nausea and vomiting.  Genitourinary: Negative for dysuria.  Musculoskeletal: Negative for back pain.  Skin: Negative for rash and wound.  Neurological: Negative for headaches.  Psychiatric/Behavioral: The patient is not nervous/anxious.      Physical Exam Updated Vital Signs BP (!) 123/99   Pulse (!) 102   Temp 98.9 F (37.2 C)   Resp 16   Ht _0  (1.6 m)   Wt 154 lb (69.9 kg)   SpO2 100%   BMI 27.28 kg/m   Physical Exam  Constitutional: She appears well-developed and well-nourished. No distress.  HENT:  Head: Normocephalic and atraumatic.  Mouth/Throat: Oropharynx is clear and moist. No oropharyngeal exudate.  Eyes: Conjunctivae are normal. Pupils are equal, round, and reactive to light. Right eye exhibits no discharge. Left eye exhibits no discharge. No scleral icterus.  Neck: Normal range of motion. Neck supple. No thyromegaly present.  Cardiovascular: Normal rate, regular rhythm, normal heart sounds and intact distal pulses.  Exam reveals no gallop and no friction rub.   No murmur heard. Pulmonary/Chest: Effort normal and breath sounds normal. No stridor. No respiratory distress. She has no wheezes. She has no rales.  Abdominal: Soft. Bowel sounds are normal. She exhibits no distension. There is tenderness. There is no rebound and no guarding.  RUQ TTP. Pain to right upper to mid abdomen with palpation of LLQ.   Genitourinary:  Genitourinary Comments: Mild right flank tenderness. No CVA tenderness.   Musculoskeletal: She exhibits no edema.  Lymphadenopathy:    She has no cervical adenopathy.  Neurological: She is alert. Coordination normal.  Skin: Skin is warm and dry. No rash noted. She is not diaphoretic. No pallor.  Psychiatric: She has a normal mood and affect.  Nursing note and vitals reviewed.    ED  Treatments / Results  DIAGNOSTIC STUDIES: Oxygen Saturation is 100% on RA, normal by my interpretation.    COORDINATION OF CARE: 9:49 PM- Pt advised of plan for treatment and pt agrees.  Labs (all labs ordered are listed, but only abnormal results are displayed) Labs Reviewed  URINALYSIS, ROUTINE W REFLEX MICROSCOPIC - Abnormal; Notable for the following:       Result Value   APPearance CLOUDY (*)    All other components within normal limits  CBC WITH DIFFERENTIAL/PLATELET  RAPID URINE DRUG SCREEN, HOSP PERFORMED    EKG  EKG Interpretation None       Radiology No results found.  Procedures Procedures (including critical care time)  Medications Ordered in ED Medications  morphine 4 MG/ML injection 4 mg (4 mg Intravenous Given 11/18/16 2315)  dicyclomine (BENTYL) injection 20 mg (20 mg Intramuscular Given 11/19/16 0137)  haloperidol lactate (HALDOL) injection 2 mg (2  mg Intramuscular Given 11/19/16 0136)     Initial Impression / Assessment and Plan / ED Course  I have reviewed the triage vital signs and the nursing notes.  Pertinent labs & imaging results that were available during my care of the patient were reviewed by me and considered in my medical decision making (see chart for details).      Patient is nontoxic, nonseptic appearing, in no apparent distress. Patient's pain and other symptoms adequately managed in emergency department with morphine, Bentyl, Haldol IM.  CBC unremarkable. UA shows no bilirubin. CMP and lipase were unable to be obtained after first draw was hemolyzed and multiple venous and arterial attempts by multiple nursing staff. Patient does not meet the SIRS or Sepsis criteria. Patient reporting last oxycodone this morning, however UDS negative. Considering unable to check labs and nonemergent indication, patient is to follow up with PCP and gastroenterologist for further workup. She is recommended to have liver function tests and hepatitis workup.  Patient discharged home with Bentyl and given strict return precaiutions. Patient has Percocet at home. Patient expresses understanding and agrees with plan. Patient vitals stable throughout ED course and discharged in satisfactory condition. Patient also evaluated by Dr. Tamera Punt who guided the patient's management. I also discussed patient case with Dr. Randal Buba after shift change who guided the patient's management and agrees with plan.     Final Clinical Impressions(s) / ED Diagnoses   Final diagnoses:  RUQ pain    New Prescriptions New Prescriptions   DICYCLOMINE (BENTYL) 20 MG TABLET    Take 1 tablet (20 mg total) by mouth 2 (two) times daily.   I personally performed the services described in this documentation, which was scribed in my presence. The recorded information has been reviewed and is accurate.     [name] PA-C 11/19/16 [place], MD 11/19/16 [phone]

## 2016-11-19 DIAGNOSIS — R1011 Right upper quadrant pain: Secondary | ICD-10-CM | POA: Diagnosis not present

## 2016-11-19 LAB — RAPID URINE DRUG SCREEN, HOSP PERFORMED
Amphetamines: NOT DETECTED
Barbiturates: NOT DETECTED
Benzodiazepines: NOT DETECTED
Cocaine: NOT DETECTED
Opiates: NOT DETECTED
Tetrahydrocannabinol: NOT DETECTED

## 2016-11-19 MED ORDER — DICYCLOMINE HCL 10 MG/ML IM SOLN
20.0000 mg | Freq: Once | INTRAMUSCULAR | Status: AC
  Administered 2016-11-19: 20 mg via INTRAMUSCULAR
  Filled 2016-11-19: qty 2

## 2016-11-19 MED ORDER — DICYCLOMINE HCL 20 MG PO TABS: 20.0000 mg | ORAL_TABLET | Freq: Two times a day (BID) | ORAL | 0 refills | Status: DC

## 2016-11-19 MED ORDER — HALOPERIDOL LACTATE 5 MG/ML IJ SOLN
2.0000 mg | Freq: Once | INTRAMUSCULAR | Status: AC
  Administered 2016-11-19: 2 mg via INTRAMUSCULAR
  Filled 2016-11-19: qty 1

## 2016-11-19 NOTE — Discharge Instructions (Signed)
Medications: Bentyl  Treatment: Take Bentyl twice daily as prescribed. Continue taking your pain medication as prescribed by her pain management clinic.  Follow-up: Please follow-up with your primary care provider for liver function tests and hepatitis panel and your gastroenterologist for continued evaluation and treatment. Please return to emergency department if you develop any fevers, intractable vomiting, or any other new or worsening symptoms.

## 2016-11-19 NOTE — ED Notes (Signed)
Pt refused arterial blood draw, EDPA notified, no new orders given, states EDP with see pt

## 2016-11-21 DIAGNOSIS — M19019 Primary osteoarthritis, unspecified shoulder: Secondary | ICD-10-CM | POA: Diagnosis not present

## 2016-11-21 DIAGNOSIS — M7500 Adhesive capsulitis of unspecified shoulder: Secondary | ICD-10-CM | POA: Diagnosis not present

## 2016-11-21 DIAGNOSIS — M5481 Occipital neuralgia: Secondary | ICD-10-CM | POA: Diagnosis not present

## 2016-11-21 DIAGNOSIS — M542 Cervicalgia: Secondary | ICD-10-CM | POA: Diagnosis not present

## 2016-11-22 ENCOUNTER — Other Ambulatory Visit: Payer: Self-pay | Admitting: Gastroenterology

## 2016-11-22 DIAGNOSIS — R1031 Right lower quadrant pain: Secondary | ICD-10-CM

## 2016-11-22 DIAGNOSIS — F4325 Adjustment disorder with mixed disturbance of emotions and conduct: Secondary | ICD-10-CM | POA: Diagnosis not present

## 2016-11-22 DIAGNOSIS — R1084 Generalized abdominal pain: Secondary | ICD-10-CM

## 2016-11-25 ENCOUNTER — Ambulatory Visit
Admission: RE | Admit: 2016-11-25 | Discharge: 2016-11-25 | Disposition: A | Discharge status: Home / Self Care | Payer: Medicare Other | Source: Ambulatory Visit | Attending: Gastroenterology | Admitting: Gastroenterology

## 2016-11-25 DIAGNOSIS — R1031 Right lower quadrant pain: Secondary | ICD-10-CM

## 2016-11-25 DIAGNOSIS — R1084 Generalized abdominal pain: Secondary | ICD-10-CM

## 2016-11-29 DIAGNOSIS — Z6827 Body mass index (BMI) 27.0-27.9, adult: Secondary | ICD-10-CM | POA: Diagnosis not present

## 2016-11-29 DIAGNOSIS — D492 Neoplasm of unspecified behavior of bone, soft tissue, and skin: Secondary | ICD-10-CM | POA: Diagnosis not present

## 2016-12-01 DIAGNOSIS — K5900 Constipation, unspecified: Secondary | ICD-10-CM | POA: Diagnosis not present

## 2016-12-01 DIAGNOSIS — R1084 Generalized abdominal pain: Secondary | ICD-10-CM | POA: Diagnosis not present

## 2016-12-01 DIAGNOSIS — M7500 Adhesive capsulitis of unspecified shoulder: Secondary | ICD-10-CM | POA: Diagnosis not present

## 2016-12-01 DIAGNOSIS — M19019 Primary osteoarthritis, unspecified shoulder: Secondary | ICD-10-CM | POA: Diagnosis not present

## 2016-12-02 DIAGNOSIS — M545 Low back pain: Secondary | ICD-10-CM | POA: Diagnosis not present

## 2016-12-05 DIAGNOSIS — M545 Low back pain: Secondary | ICD-10-CM | POA: Diagnosis not present

## 2016-12-06 DIAGNOSIS — F4325 Adjustment disorder with mixed disturbance of emotions and conduct: Secondary | ICD-10-CM | POA: Diagnosis not present

## 2016-12-07 DIAGNOSIS — D492 Neoplasm of unspecified behavior of bone, soft tissue, and skin: Secondary | ICD-10-CM | POA: Diagnosis not present

## 2016-12-07 DIAGNOSIS — M7061 Trochanteric bursitis, right hip: Secondary | ICD-10-CM | POA: Diagnosis not present

## 2016-12-08 DIAGNOSIS — M545 Low back pain: Secondary | ICD-10-CM | POA: Diagnosis not present

## 2016-12-12 DIAGNOSIS — M961 Postlaminectomy syndrome, not elsewhere classified: Secondary | ICD-10-CM | POA: Diagnosis not present

## 2016-12-12 DIAGNOSIS — G894 Chronic pain syndrome: Secondary | ICD-10-CM | POA: Diagnosis not present

## 2016-12-12 DIAGNOSIS — M79604 Pain in right leg: Secondary | ICD-10-CM | POA: Diagnosis not present

## 2016-12-12 DIAGNOSIS — M545 Low back pain: Secondary | ICD-10-CM | POA: Diagnosis not present

## 2016-12-15 DIAGNOSIS — M545 Low back pain: Secondary | ICD-10-CM | POA: Diagnosis not present

## 2016-12-19 DIAGNOSIS — M545 Low back pain: Secondary | ICD-10-CM | POA: Diagnosis not present

## 2016-12-22 DIAGNOSIS — M545 Low back pain: Secondary | ICD-10-CM | POA: Diagnosis not present

## 2016-12-27 DIAGNOSIS — F4325 Adjustment disorder with mixed disturbance of emotions and conduct: Secondary | ICD-10-CM | POA: Diagnosis not present

## 2016-12-28 DIAGNOSIS — M545 Low back pain: Secondary | ICD-10-CM | POA: Diagnosis not present

## 2016-12-29 DIAGNOSIS — M47816 Spondylosis without myelopathy or radiculopathy, lumbar region: Secondary | ICD-10-CM | POA: Diagnosis not present

## 2017-01-03 DIAGNOSIS — M545 Low back pain: Secondary | ICD-10-CM | POA: Diagnosis not present

## 2017-01-05 DIAGNOSIS — M19019 Primary osteoarthritis, unspecified shoulder: Secondary | ICD-10-CM | POA: Diagnosis not present

## 2017-01-06 DIAGNOSIS — M545 Low back pain: Secondary | ICD-10-CM | POA: Diagnosis not present

## 2017-01-11 DIAGNOSIS — E785 Hyperlipidemia, unspecified: Secondary | ICD-10-CM | POA: Diagnosis not present

## 2017-01-11 DIAGNOSIS — N951 Menopausal and female climacteric states: Secondary | ICD-10-CM | POA: Diagnosis not present

## 2017-01-11 DIAGNOSIS — J42 Unspecified chronic bronchitis: Secondary | ICD-10-CM | POA: Diagnosis not present

## 2017-01-11 DIAGNOSIS — K210 Gastro-esophageal reflux disease with esophagitis: Secondary | ICD-10-CM | POA: Diagnosis not present

## 2017-01-11 DIAGNOSIS — K581 Irritable bowel syndrome with constipation: Secondary | ICD-10-CM | POA: Diagnosis not present

## 2017-01-11 DIAGNOSIS — M179 Osteoarthritis of knee, unspecified: Secondary | ICD-10-CM | POA: Diagnosis not present

## 2017-01-11 DIAGNOSIS — D492 Neoplasm of unspecified behavior of bone, soft tissue, and skin: Secondary | ICD-10-CM | POA: Diagnosis not present

## 2017-01-11 DIAGNOSIS — M545 Low back pain: Secondary | ICD-10-CM | POA: Diagnosis not present

## 2017-01-11 DIAGNOSIS — G894 Chronic pain syndrome: Secondary | ICD-10-CM | POA: Diagnosis not present

## 2017-01-11 DIAGNOSIS — Z01818 Encounter for other preprocedural examination: Secondary | ICD-10-CM | POA: Diagnosis not present

## 2017-01-20 DIAGNOSIS — M25569 Pain in unspecified knee: Secondary | ICD-10-CM | POA: Diagnosis not present

## 2017-01-20 DIAGNOSIS — Z79899 Other long term (current) drug therapy: Secondary | ICD-10-CM | POA: Diagnosis not present

## 2017-01-20 DIAGNOSIS — Z79891 Long term (current) use of opiate analgesic: Secondary | ICD-10-CM | POA: Diagnosis not present

## 2017-01-20 DIAGNOSIS — M25519 Pain in unspecified shoulder: Secondary | ICD-10-CM | POA: Diagnosis not present

## 2017-01-20 DIAGNOSIS — M47816 Spondylosis without myelopathy or radiculopathy, lumbar region: Secondary | ICD-10-CM | POA: Diagnosis not present

## 2017-01-20 DIAGNOSIS — G894 Chronic pain syndrome: Secondary | ICD-10-CM | POA: Diagnosis not present

## 2017-01-25 DIAGNOSIS — D492 Neoplasm of unspecified behavior of bone, soft tissue, and skin: Secondary | ICD-10-CM | POA: Diagnosis not present

## 2017-01-26 DIAGNOSIS — E7800 Pure hypercholesterolemia, unspecified: Secondary | ICD-10-CM | POA: Diagnosis not present

## 2017-01-26 DIAGNOSIS — M1990 Unspecified osteoarthritis, unspecified site: Secondary | ICD-10-CM | POA: Diagnosis not present

## 2017-01-26 DIAGNOSIS — I129 Hypertensive chronic kidney disease with stage 1 through stage 4 chronic kidney disease, or unspecified chronic kidney disease: Secondary | ICD-10-CM | POA: Diagnosis not present

## 2017-01-26 DIAGNOSIS — Z9851 Tubal ligation status: Secondary | ICD-10-CM | POA: Diagnosis not present

## 2017-01-26 DIAGNOSIS — D492 Neoplasm of unspecified behavior of bone, soft tissue, and skin: Secondary | ICD-10-CM | POA: Diagnosis not present

## 2017-01-26 DIAGNOSIS — K219 Gastro-esophageal reflux disease without esophagitis: Secondary | ICD-10-CM | POA: Diagnosis not present

## 2017-01-26 DIAGNOSIS — J45909 Unspecified asthma, uncomplicated: Secondary | ICD-10-CM | POA: Diagnosis not present

## 2017-01-26 DIAGNOSIS — Z981 Arthrodesis status: Secondary | ICD-10-CM | POA: Diagnosis not present

## 2017-01-26 DIAGNOSIS — G5611 Other lesions of median nerve, right upper limb: Secondary | ICD-10-CM | POA: Diagnosis not present

## 2017-01-26 DIAGNOSIS — N183 Chronic kidney disease, stage 3 (moderate): Secondary | ICD-10-CM | POA: Diagnosis not present

## 2017-01-26 DIAGNOSIS — D3612 Benign neoplasm of peripheral nerves and autonomic nervous system, upper limb, including shoulder: Secondary | ICD-10-CM | POA: Diagnosis not present

## 2017-01-26 DIAGNOSIS — D333 Benign neoplasm of cranial nerves: Secondary | ICD-10-CM | POA: Diagnosis not present

## 2017-01-26 HISTORY — PX: OTHER SURGICAL HISTORY: SHX169

## 2017-02-06 DIAGNOSIS — F4325 Adjustment disorder with mixed disturbance of emotions and conduct: Secondary | ICD-10-CM | POA: Diagnosis not present

## 2017-02-20 DIAGNOSIS — F4325 Adjustment disorder with mixed disturbance of emotions and conduct: Secondary | ICD-10-CM | POA: Diagnosis not present

## 2017-02-22 ENCOUNTER — Other Ambulatory Visit: Payer: Self-pay | Admitting: Pain Medicine

## 2017-02-22 DIAGNOSIS — M47816 Spondylosis without myelopathy or radiculopathy, lumbar region: Secondary | ICD-10-CM | POA: Diagnosis not present

## 2017-02-22 DIAGNOSIS — M25551 Pain in right hip: Secondary | ICD-10-CM

## 2017-02-28 DIAGNOSIS — D492 Neoplasm of unspecified behavior of bone, soft tissue, and skin: Secondary | ICD-10-CM | POA: Diagnosis not present

## 2017-02-28 DIAGNOSIS — M25649 Stiffness of unspecified hand, not elsewhere classified: Secondary | ICD-10-CM | POA: Diagnosis not present

## 2017-02-28 DIAGNOSIS — R203 Hyperesthesia: Secondary | ICD-10-CM | POA: Diagnosis not present

## 2017-02-28 DIAGNOSIS — M79601 Pain in right arm: Secondary | ICD-10-CM | POA: Diagnosis not present

## 2017-02-28 DIAGNOSIS — M7989 Other specified soft tissue disorders: Secondary | ICD-10-CM | POA: Diagnosis not present

## 2017-02-28 DIAGNOSIS — R29898 Other symptoms and signs involving the musculoskeletal system: Secondary | ICD-10-CM | POA: Diagnosis not present

## 2017-03-06 DIAGNOSIS — R29898 Other symptoms and signs involving the musculoskeletal system: Secondary | ICD-10-CM | POA: Diagnosis not present

## 2017-03-06 DIAGNOSIS — F4325 Adjustment disorder with mixed disturbance of emotions and conduct: Secondary | ICD-10-CM | POA: Diagnosis not present

## 2017-03-06 DIAGNOSIS — M79601 Pain in right arm: Secondary | ICD-10-CM | POA: Diagnosis not present

## 2017-03-06 DIAGNOSIS — D492 Neoplasm of unspecified behavior of bone, soft tissue, and skin: Secondary | ICD-10-CM | POA: Diagnosis not present

## 2017-03-06 DIAGNOSIS — M7989 Other specified soft tissue disorders: Secondary | ICD-10-CM | POA: Diagnosis not present

## 2017-03-06 DIAGNOSIS — R203 Hyperesthesia: Secondary | ICD-10-CM | POA: Diagnosis not present

## 2017-03-08 ENCOUNTER — Other Ambulatory Visit: Payer: Medicare Other

## 2017-03-13 DIAGNOSIS — M25649 Stiffness of unspecified hand, not elsewhere classified: Secondary | ICD-10-CM | POA: Diagnosis not present

## 2017-03-13 DIAGNOSIS — M79601 Pain in right arm: Secondary | ICD-10-CM | POA: Diagnosis not present

## 2017-03-13 DIAGNOSIS — D492 Neoplasm of unspecified behavior of bone, soft tissue, and skin: Secondary | ICD-10-CM | POA: Diagnosis not present

## 2017-03-13 DIAGNOSIS — R29898 Other symptoms and signs involving the musculoskeletal system: Secondary | ICD-10-CM | POA: Diagnosis not present

## 2017-03-13 DIAGNOSIS — M47816 Spondylosis without myelopathy or radiculopathy, lumbar region: Secondary | ICD-10-CM | POA: Diagnosis not present

## 2017-03-13 DIAGNOSIS — R203 Hyperesthesia: Secondary | ICD-10-CM | POA: Diagnosis not present

## 2017-03-13 DIAGNOSIS — M7989 Other specified soft tissue disorders: Secondary | ICD-10-CM | POA: Diagnosis not present

## 2017-03-15 DIAGNOSIS — M25649 Stiffness of unspecified hand, not elsewhere classified: Secondary | ICD-10-CM | POA: Diagnosis not present

## 2017-03-15 DIAGNOSIS — M79601 Pain in right arm: Secondary | ICD-10-CM | POA: Diagnosis not present

## 2017-03-15 DIAGNOSIS — D492 Neoplasm of unspecified behavior of bone, soft tissue, and skin: Secondary | ICD-10-CM | POA: Diagnosis not present

## 2017-03-15 DIAGNOSIS — R203 Hyperesthesia: Secondary | ICD-10-CM | POA: Diagnosis not present

## 2017-03-15 DIAGNOSIS — M7989 Other specified soft tissue disorders: Secondary | ICD-10-CM | POA: Diagnosis not present

## 2017-03-15 DIAGNOSIS — R29898 Other symptoms and signs involving the musculoskeletal system: Secondary | ICD-10-CM | POA: Diagnosis not present

## 2017-03-21 ENCOUNTER — Other Ambulatory Visit: Payer: Medicare Other

## 2017-03-21 DIAGNOSIS — M79601 Pain in right arm: Secondary | ICD-10-CM | POA: Diagnosis not present

## 2017-03-21 DIAGNOSIS — M25649 Stiffness of unspecified hand, not elsewhere classified: Secondary | ICD-10-CM | POA: Diagnosis not present

## 2017-03-21 DIAGNOSIS — R29898 Other symptoms and signs involving the musculoskeletal system: Secondary | ICD-10-CM | POA: Diagnosis not present

## 2017-03-21 DIAGNOSIS — R203 Hyperesthesia: Secondary | ICD-10-CM | POA: Diagnosis not present

## 2017-03-21 DIAGNOSIS — M7989 Other specified soft tissue disorders: Secondary | ICD-10-CM | POA: Diagnosis not present

## 2017-03-21 DIAGNOSIS — D492 Neoplasm of unspecified behavior of bone, soft tissue, and skin: Secondary | ICD-10-CM | POA: Diagnosis not present

## 2017-03-22 ENCOUNTER — Other Ambulatory Visit: Payer: Medicare Other

## 2017-03-27 DIAGNOSIS — M79601 Pain in right arm: Secondary | ICD-10-CM | POA: Diagnosis not present

## 2017-03-27 DIAGNOSIS — D492 Neoplasm of unspecified behavior of bone, soft tissue, and skin: Secondary | ICD-10-CM | POA: Diagnosis not present

## 2017-03-27 DIAGNOSIS — R203 Hyperesthesia: Secondary | ICD-10-CM | POA: Diagnosis not present

## 2017-03-27 DIAGNOSIS — M25649 Stiffness of unspecified hand, not elsewhere classified: Secondary | ICD-10-CM | POA: Diagnosis not present

## 2017-03-27 DIAGNOSIS — M7989 Other specified soft tissue disorders: Secondary | ICD-10-CM | POA: Diagnosis not present

## 2017-03-27 DIAGNOSIS — R29898 Other symptoms and signs involving the musculoskeletal system: Secondary | ICD-10-CM | POA: Diagnosis not present

## 2017-03-29 DIAGNOSIS — M79601 Pain in right arm: Secondary | ICD-10-CM | POA: Diagnosis not present

## 2017-03-29 DIAGNOSIS — R203 Hyperesthesia: Secondary | ICD-10-CM | POA: Diagnosis not present

## 2017-03-29 DIAGNOSIS — M7989 Other specified soft tissue disorders: Secondary | ICD-10-CM | POA: Diagnosis not present

## 2017-03-29 DIAGNOSIS — D492 Neoplasm of unspecified behavior of bone, soft tissue, and skin: Secondary | ICD-10-CM | POA: Diagnosis not present

## 2017-03-29 DIAGNOSIS — R29898 Other symptoms and signs involving the musculoskeletal system: Secondary | ICD-10-CM | POA: Diagnosis not present

## 2017-03-31 DIAGNOSIS — M47816 Spondylosis without myelopathy or radiculopathy, lumbar region: Secondary | ICD-10-CM | POA: Diagnosis not present

## 2017-03-31 DIAGNOSIS — M25519 Pain in unspecified shoulder: Secondary | ICD-10-CM | POA: Diagnosis not present

## 2017-03-31 DIAGNOSIS — Z79891 Long term (current) use of opiate analgesic: Secondary | ICD-10-CM | POA: Diagnosis not present

## 2017-03-31 DIAGNOSIS — G894 Chronic pain syndrome: Secondary | ICD-10-CM | POA: Diagnosis not present

## 2017-03-31 DIAGNOSIS — M545 Low back pain: Secondary | ICD-10-CM | POA: Diagnosis not present

## 2017-03-31 DIAGNOSIS — Z79899 Other long term (current) drug therapy: Secondary | ICD-10-CM | POA: Diagnosis not present

## 2017-04-03 DIAGNOSIS — F4325 Adjustment disorder with mixed disturbance of emotions and conduct: Secondary | ICD-10-CM | POA: Diagnosis not present

## 2017-04-06 DIAGNOSIS — D492 Neoplasm of unspecified behavior of bone, soft tissue, and skin: Secondary | ICD-10-CM | POA: Diagnosis not present

## 2017-04-06 DIAGNOSIS — R203 Hyperesthesia: Secondary | ICD-10-CM | POA: Diagnosis not present

## 2017-04-06 DIAGNOSIS — R29898 Other symptoms and signs involving the musculoskeletal system: Secondary | ICD-10-CM | POA: Diagnosis not present

## 2017-04-06 DIAGNOSIS — M79601 Pain in right arm: Secondary | ICD-10-CM | POA: Diagnosis not present

## 2017-04-11 DIAGNOSIS — R203 Hyperesthesia: Secondary | ICD-10-CM | POA: Diagnosis not present

## 2017-04-11 DIAGNOSIS — R29898 Other symptoms and signs involving the musculoskeletal system: Secondary | ICD-10-CM | POA: Diagnosis not present

## 2017-04-11 DIAGNOSIS — M79601 Pain in right arm: Secondary | ICD-10-CM | POA: Diagnosis not present

## 2017-04-11 DIAGNOSIS — D492 Neoplasm of unspecified behavior of bone, soft tissue, and skin: Secondary | ICD-10-CM | POA: Diagnosis not present

## 2017-04-12 DIAGNOSIS — D492 Neoplasm of unspecified behavior of bone, soft tissue, and skin: Secondary | ICD-10-CM | POA: Diagnosis not present

## 2017-04-17 DIAGNOSIS — F4325 Adjustment disorder with mixed disturbance of emotions and conduct: Secondary | ICD-10-CM | POA: Diagnosis not present

## 2017-04-18 DIAGNOSIS — M79601 Pain in right arm: Secondary | ICD-10-CM | POA: Diagnosis not present

## 2017-04-18 DIAGNOSIS — R203 Hyperesthesia: Secondary | ICD-10-CM | POA: Diagnosis not present

## 2017-04-18 DIAGNOSIS — R29898 Other symptoms and signs involving the musculoskeletal system: Secondary | ICD-10-CM | POA: Diagnosis not present

## 2017-04-19 DIAGNOSIS — M961 Postlaminectomy syndrome, not elsewhere classified: Secondary | ICD-10-CM | POA: Diagnosis not present

## 2017-04-19 DIAGNOSIS — M5136 Other intervertebral disc degeneration, lumbar region: Secondary | ICD-10-CM | POA: Diagnosis not present

## 2017-04-19 DIAGNOSIS — M545 Low back pain: Secondary | ICD-10-CM | POA: Diagnosis not present

## 2017-04-19 DIAGNOSIS — M47816 Spondylosis without myelopathy or radiculopathy, lumbar region: Secondary | ICD-10-CM | POA: Diagnosis not present

## 2017-04-20 DIAGNOSIS — M79601 Pain in right arm: Secondary | ICD-10-CM | POA: Diagnosis not present

## 2017-04-20 DIAGNOSIS — R29898 Other symptoms and signs involving the musculoskeletal system: Secondary | ICD-10-CM | POA: Diagnosis not present

## 2017-04-20 DIAGNOSIS — R203 Hyperesthesia: Secondary | ICD-10-CM | POA: Diagnosis not present

## 2017-04-28 DIAGNOSIS — M25569 Pain in unspecified knee: Secondary | ICD-10-CM | POA: Diagnosis not present

## 2017-04-28 DIAGNOSIS — Z79899 Other long term (current) drug therapy: Secondary | ICD-10-CM | POA: Diagnosis not present

## 2017-04-28 DIAGNOSIS — M25519 Pain in unspecified shoulder: Secondary | ICD-10-CM | POA: Diagnosis not present

## 2017-04-28 DIAGNOSIS — G894 Chronic pain syndrome: Secondary | ICD-10-CM | POA: Diagnosis not present

## 2017-04-28 DIAGNOSIS — M47816 Spondylosis without myelopathy or radiculopathy, lumbar region: Secondary | ICD-10-CM | POA: Diagnosis not present

## 2017-04-28 DIAGNOSIS — Z79891 Long term (current) use of opiate analgesic: Secondary | ICD-10-CM | POA: Diagnosis not present

## 2017-05-02 DIAGNOSIS — R531 Weakness: Secondary | ICD-10-CM | POA: Diagnosis not present

## 2017-05-02 DIAGNOSIS — M79644 Pain in right finger(s): Secondary | ICD-10-CM | POA: Diagnosis not present

## 2017-05-10 DIAGNOSIS — R531 Weakness: Secondary | ICD-10-CM | POA: Diagnosis not present

## 2017-05-10 DIAGNOSIS — R203 Hyperesthesia: Secondary | ICD-10-CM | POA: Diagnosis not present

## 2017-05-10 DIAGNOSIS — R29898 Other symptoms and signs involving the musculoskeletal system: Secondary | ICD-10-CM | POA: Diagnosis not present

## 2017-05-10 DIAGNOSIS — M79644 Pain in right finger(s): Secondary | ICD-10-CM | POA: Diagnosis not present

## 2017-05-17 ENCOUNTER — Other Ambulatory Visit: Payer: Medicare Other

## 2017-05-17 DIAGNOSIS — M79601 Pain in right arm: Secondary | ICD-10-CM | POA: Diagnosis not present

## 2017-05-17 DIAGNOSIS — R531 Weakness: Secondary | ICD-10-CM | POA: Diagnosis not present

## 2017-05-17 DIAGNOSIS — R203 Hyperesthesia: Secondary | ICD-10-CM | POA: Diagnosis not present

## 2017-05-17 DIAGNOSIS — F4325 Adjustment disorder with mixed disturbance of emotions and conduct: Secondary | ICD-10-CM | POA: Diagnosis not present

## 2017-05-17 DIAGNOSIS — M79644 Pain in right finger(s): Secondary | ICD-10-CM | POA: Diagnosis not present

## 2017-05-17 DIAGNOSIS — R29898 Other symptoms and signs involving the musculoskeletal system: Secondary | ICD-10-CM | POA: Diagnosis not present

## 2017-05-22 ENCOUNTER — Ambulatory Visit
Admission: RE | Admit: 2017-05-22 | Discharge: 2017-05-22 | Disposition: A | Discharge status: Home / Self Care | Payer: Medicare Other | Source: Ambulatory Visit | Attending: Pain Medicine | Admitting: Pain Medicine

## 2017-05-22 DIAGNOSIS — M25551 Pain in right hip: Secondary | ICD-10-CM

## 2017-05-22 DIAGNOSIS — R600 Localized edema: Secondary | ICD-10-CM | POA: Diagnosis not present

## 2017-05-23 DIAGNOSIS — Z23 Encounter for immunization: Secondary | ICD-10-CM | POA: Diagnosis not present

## 2017-05-23 DIAGNOSIS — K581 Irritable bowel syndrome with constipation: Secondary | ICD-10-CM | POA: Diagnosis not present

## 2017-05-23 DIAGNOSIS — Z0000 Encounter for general adult medical examination without abnormal findings: Secondary | ICD-10-CM | POA: Diagnosis not present

## 2017-05-23 DIAGNOSIS — K219 Gastro-esophageal reflux disease without esophagitis: Secondary | ICD-10-CM | POA: Diagnosis not present

## 2017-05-23 DIAGNOSIS — Z6828 Body mass index (BMI) 28.0-28.9, adult: Secondary | ICD-10-CM | POA: Diagnosis not present

## 2017-05-23 DIAGNOSIS — D122 Benign neoplasm of ascending colon: Secondary | ICD-10-CM | POA: Diagnosis not present

## 2017-05-23 DIAGNOSIS — G589 Mononeuropathy, unspecified: Secondary | ICD-10-CM | POA: Diagnosis not present

## 2017-05-23 DIAGNOSIS — J42 Unspecified chronic bronchitis: Secondary | ICD-10-CM | POA: Diagnosis not present

## 2017-05-23 DIAGNOSIS — K5900 Constipation, unspecified: Secondary | ICD-10-CM | POA: Diagnosis not present

## 2017-05-23 DIAGNOSIS — Z1389 Encounter for screening for other disorder: Secondary | ICD-10-CM | POA: Diagnosis not present

## 2017-05-23 DIAGNOSIS — E785 Hyperlipidemia, unspecified: Secondary | ICD-10-CM | POA: Diagnosis not present

## 2017-05-23 DIAGNOSIS — Z1211 Encounter for screening for malignant neoplasm of colon: Secondary | ICD-10-CM | POA: Diagnosis not present

## 2017-05-24 DIAGNOSIS — M79644 Pain in right finger(s): Secondary | ICD-10-CM | POA: Diagnosis not present

## 2017-05-24 DIAGNOSIS — R531 Weakness: Secondary | ICD-10-CM | POA: Diagnosis not present

## 2017-05-24 DIAGNOSIS — Z23 Encounter for immunization: Secondary | ICD-10-CM | POA: Diagnosis not present

## 2017-05-24 DIAGNOSIS — R29898 Other symptoms and signs involving the musculoskeletal system: Secondary | ICD-10-CM | POA: Diagnosis not present

## 2017-05-24 DIAGNOSIS — R203 Hyperesthesia: Secondary | ICD-10-CM | POA: Diagnosis not present

## 2017-05-25 DIAGNOSIS — G894 Chronic pain syndrome: Secondary | ICD-10-CM | POA: Diagnosis not present

## 2017-05-25 DIAGNOSIS — M47816 Spondylosis without myelopathy or radiculopathy, lumbar region: Secondary | ICD-10-CM | POA: Diagnosis not present

## 2017-05-25 DIAGNOSIS — M545 Low back pain: Secondary | ICD-10-CM | POA: Diagnosis not present

## 2017-06-12 DIAGNOSIS — M5481 Occipital neuralgia: Secondary | ICD-10-CM | POA: Diagnosis not present

## 2017-07-03 DIAGNOSIS — Z79899 Other long term (current) drug therapy: Secondary | ICD-10-CM | POA: Diagnosis not present

## 2017-07-03 DIAGNOSIS — M545 Low back pain: Secondary | ICD-10-CM | POA: Diagnosis not present

## 2017-07-03 DIAGNOSIS — Z79891 Long term (current) use of opiate analgesic: Secondary | ICD-10-CM | POA: Diagnosis not present

## 2017-07-03 DIAGNOSIS — G894 Chronic pain syndrome: Secondary | ICD-10-CM | POA: Diagnosis not present

## 2017-07-03 DIAGNOSIS — F4325 Adjustment disorder with mixed disturbance of emotions and conduct: Secondary | ICD-10-CM | POA: Diagnosis not present

## 2017-07-03 DIAGNOSIS — M47816 Spondylosis without myelopathy or radiculopathy, lumbar region: Secondary | ICD-10-CM | POA: Diagnosis not present

## 2017-07-27 DIAGNOSIS — M47816 Spondylosis without myelopathy or radiculopathy, lumbar region: Secondary | ICD-10-CM | POA: Diagnosis not present

## 2017-07-27 DIAGNOSIS — M545 Low back pain: Secondary | ICD-10-CM | POA: Diagnosis not present

## 2017-07-27 DIAGNOSIS — Z79899 Other long term (current) drug therapy: Secondary | ICD-10-CM | POA: Diagnosis not present

## 2017-07-27 DIAGNOSIS — G894 Chronic pain syndrome: Secondary | ICD-10-CM | POA: Diagnosis not present

## 2017-07-28 DIAGNOSIS — F4325 Adjustment disorder with mixed disturbance of emotions and conduct: Secondary | ICD-10-CM | POA: Diagnosis not present

## 2017-08-09 DIAGNOSIS — Z803 Family history of malignant neoplasm of breast: Secondary | ICD-10-CM | POA: Diagnosis not present

## 2017-08-09 DIAGNOSIS — A6004 Herpesviral vulvovaginitis: Secondary | ICD-10-CM | POA: Diagnosis not present

## 2017-08-09 DIAGNOSIS — L292 Pruritus vulvae: Secondary | ICD-10-CM | POA: Diagnosis not present

## 2017-08-09 DIAGNOSIS — N3010 Interstitial cystitis (chronic) without hematuria: Secondary | ICD-10-CM | POA: Diagnosis not present

## 2017-08-09 DIAGNOSIS — E8940 Asymptomatic postprocedural ovarian failure: Secondary | ICD-10-CM | POA: Diagnosis not present

## 2017-08-09 DIAGNOSIS — N951 Menopausal and female climacteric states: Secondary | ICD-10-CM | POA: Diagnosis not present

## 2017-08-09 DIAGNOSIS — R102 Pelvic and perineal pain: Secondary | ICD-10-CM | POA: Diagnosis not present

## 2017-08-09 DIAGNOSIS — Z01411 Encounter for gynecological examination (general) (routine) with abnormal findings: Secondary | ICD-10-CM | POA: Diagnosis not present

## 2017-08-23 DIAGNOSIS — M62838 Other muscle spasm: Secondary | ICD-10-CM | POA: Diagnosis not present

## 2017-08-23 DIAGNOSIS — R3911 Hesitancy of micturition: Secondary | ICD-10-CM | POA: Diagnosis not present

## 2017-08-23 DIAGNOSIS — M545 Low back pain: Secondary | ICD-10-CM | POA: Diagnosis not present

## 2017-08-23 DIAGNOSIS — R102 Pelvic and perineal pain: Secondary | ICD-10-CM | POA: Diagnosis not present

## 2017-08-24 DIAGNOSIS — M25519 Pain in unspecified shoulder: Secondary | ICD-10-CM | POA: Diagnosis not present

## 2017-08-24 DIAGNOSIS — G894 Chronic pain syndrome: Secondary | ICD-10-CM | POA: Diagnosis not present

## 2017-08-24 DIAGNOSIS — Z79891 Long term (current) use of opiate analgesic: Secondary | ICD-10-CM | POA: Diagnosis not present

## 2017-08-24 DIAGNOSIS — F4325 Adjustment disorder with mixed disturbance of emotions and conduct: Secondary | ICD-10-CM | POA: Diagnosis not present

## 2017-08-24 DIAGNOSIS — Z79899 Other long term (current) drug therapy: Secondary | ICD-10-CM | POA: Diagnosis not present

## 2017-08-24 DIAGNOSIS — M47816 Spondylosis without myelopathy or radiculopathy, lumbar region: Secondary | ICD-10-CM | POA: Diagnosis not present

## 2017-08-24 DIAGNOSIS — M545 Low back pain: Secondary | ICD-10-CM | POA: Diagnosis not present

## 2017-08-31 DIAGNOSIS — M25519 Pain in unspecified shoulder: Secondary | ICD-10-CM | POA: Diagnosis not present

## 2017-09-04 DIAGNOSIS — M48061 Spinal stenosis, lumbar region without neurogenic claudication: Secondary | ICD-10-CM | POA: Diagnosis not present

## 2017-09-04 DIAGNOSIS — Z981 Arthrodesis status: Secondary | ICD-10-CM | POA: Diagnosis not present

## 2017-09-04 DIAGNOSIS — M545 Low back pain: Secondary | ICD-10-CM | POA: Diagnosis not present

## 2017-09-04 DIAGNOSIS — M25561 Pain in right knee: Secondary | ICD-10-CM | POA: Diagnosis not present

## 2017-09-07 DIAGNOSIS — Z96651 Presence of right artificial knee joint: Secondary | ICD-10-CM | POA: Diagnosis not present

## 2017-09-07 DIAGNOSIS — M25561 Pain in right knee: Secondary | ICD-10-CM | POA: Diagnosis not present

## 2017-09-20 DIAGNOSIS — J111 Influenza due to unidentified influenza virus with other respiratory manifestations: Secondary | ICD-10-CM | POA: Diagnosis not present

## 2017-09-26 DIAGNOSIS — J45901 Unspecified asthma with (acute) exacerbation: Secondary | ICD-10-CM | POA: Diagnosis not present

## 2017-10-05 DIAGNOSIS — G894 Chronic pain syndrome: Secondary | ICD-10-CM | POA: Diagnosis not present

## 2017-10-05 DIAGNOSIS — M545 Low back pain: Secondary | ICD-10-CM | POA: Diagnosis not present

## 2017-10-05 DIAGNOSIS — M25519 Pain in unspecified shoulder: Secondary | ICD-10-CM | POA: Diagnosis not present

## 2017-10-05 DIAGNOSIS — M47816 Spondylosis without myelopathy or radiculopathy, lumbar region: Secondary | ICD-10-CM | POA: Diagnosis not present

## 2017-10-20 DIAGNOSIS — M25561 Pain in right knee: Secondary | ICD-10-CM | POA: Diagnosis not present

## 2017-11-01 ENCOUNTER — Other Ambulatory Visit: Payer: Self-pay | Admitting: Obstetrics and Gynecology

## 2017-11-01 DIAGNOSIS — G894 Chronic pain syndrome: Secondary | ICD-10-CM | POA: Diagnosis not present

## 2017-11-01 DIAGNOSIS — M5481 Occipital neuralgia: Secondary | ICD-10-CM | POA: Diagnosis not present

## 2017-11-01 DIAGNOSIS — Z1231 Encounter for screening mammogram for malignant neoplasm of breast: Secondary | ICD-10-CM

## 2017-11-07 DIAGNOSIS — E785 Hyperlipidemia, unspecified: Secondary | ICD-10-CM | POA: Diagnosis not present

## 2017-11-07 DIAGNOSIS — J42 Unspecified chronic bronchitis: Secondary | ICD-10-CM | POA: Diagnosis not present

## 2017-11-07 DIAGNOSIS — J45901 Unspecified asthma with (acute) exacerbation: Secondary | ICD-10-CM | POA: Diagnosis not present

## 2017-11-07 DIAGNOSIS — M179 Osteoarthritis of knee, unspecified: Secondary | ICD-10-CM | POA: Diagnosis not present

## 2017-11-07 DIAGNOSIS — E781 Pure hyperglyceridemia: Secondary | ICD-10-CM | POA: Diagnosis not present

## 2017-11-08 DIAGNOSIS — M25511 Pain in right shoulder: Secondary | ICD-10-CM | POA: Diagnosis not present

## 2017-11-13 DIAGNOSIS — M545 Low back pain: Secondary | ICD-10-CM | POA: Diagnosis not present

## 2017-11-13 DIAGNOSIS — M25562 Pain in left knee: Secondary | ICD-10-CM | POA: Diagnosis not present

## 2017-11-13 DIAGNOSIS — M542 Cervicalgia: Secondary | ICD-10-CM | POA: Diagnosis not present

## 2017-11-13 DIAGNOSIS — M6281 Muscle weakness (generalized): Secondary | ICD-10-CM | POA: Diagnosis not present

## 2017-11-13 DIAGNOSIS — G894 Chronic pain syndrome: Secondary | ICD-10-CM | POA: Diagnosis not present

## 2017-11-13 DIAGNOSIS — M25511 Pain in right shoulder: Secondary | ICD-10-CM | POA: Diagnosis not present

## 2017-11-17 DIAGNOSIS — M6281 Muscle weakness (generalized): Secondary | ICD-10-CM | POA: Diagnosis not present

## 2017-11-17 DIAGNOSIS — M25562 Pain in left knee: Secondary | ICD-10-CM | POA: Diagnosis not present

## 2017-11-17 DIAGNOSIS — M25511 Pain in right shoulder: Secondary | ICD-10-CM | POA: Diagnosis not present

## 2017-11-17 DIAGNOSIS — M542 Cervicalgia: Secondary | ICD-10-CM | POA: Diagnosis not present

## 2017-11-17 DIAGNOSIS — G894 Chronic pain syndrome: Secondary | ICD-10-CM | POA: Diagnosis not present

## 2017-11-17 DIAGNOSIS — M545 Low back pain: Secondary | ICD-10-CM | POA: Diagnosis not present

## 2017-11-20 ENCOUNTER — Ambulatory Visit: Payer: Medicare Other

## 2017-11-21 ENCOUNTER — Ambulatory Visit
Admission: RE | Admit: 2017-11-21 | Discharge: 2017-11-21 | Disposition: A | Discharge status: Home / Self Care | Payer: Medicare Other | Source: Ambulatory Visit | Attending: Obstetrics and Gynecology | Admitting: Obstetrics and Gynecology

## 2017-11-21 DIAGNOSIS — Z1231 Encounter for screening mammogram for malignant neoplasm of breast: Secondary | ICD-10-CM

## 2017-11-24 DIAGNOSIS — M25511 Pain in right shoulder: Secondary | ICD-10-CM | POA: Diagnosis not present

## 2017-11-24 DIAGNOSIS — M6281 Muscle weakness (generalized): Secondary | ICD-10-CM | POA: Diagnosis not present

## 2017-11-24 DIAGNOSIS — G894 Chronic pain syndrome: Secondary | ICD-10-CM | POA: Diagnosis not present

## 2017-11-24 DIAGNOSIS — M25562 Pain in left knee: Secondary | ICD-10-CM | POA: Diagnosis not present

## 2017-11-24 DIAGNOSIS — M542 Cervicalgia: Secondary | ICD-10-CM | POA: Diagnosis not present

## 2017-11-24 DIAGNOSIS — M545 Low back pain: Secondary | ICD-10-CM | POA: Diagnosis not present

## 2017-11-27 DIAGNOSIS — M6281 Muscle weakness (generalized): Secondary | ICD-10-CM | POA: Diagnosis not present

## 2017-11-27 DIAGNOSIS — M542 Cervicalgia: Secondary | ICD-10-CM | POA: Diagnosis not present

## 2017-11-27 DIAGNOSIS — Z780 Asymptomatic menopausal state: Secondary | ICD-10-CM | POA: Diagnosis not present

## 2017-11-27 DIAGNOSIS — M25511 Pain in right shoulder: Secondary | ICD-10-CM | POA: Diagnosis not present

## 2017-11-27 DIAGNOSIS — M545 Low back pain: Secondary | ICD-10-CM | POA: Diagnosis not present

## 2017-11-27 DIAGNOSIS — M25562 Pain in left knee: Secondary | ICD-10-CM | POA: Diagnosis not present

## 2017-11-27 DIAGNOSIS — M8588 Other specified disorders of bone density and structure, other site: Secondary | ICD-10-CM | POA: Diagnosis not present

## 2017-11-27 DIAGNOSIS — G894 Chronic pain syndrome: Secondary | ICD-10-CM | POA: Diagnosis not present

## 2017-11-28 DIAGNOSIS — R102 Pelvic and perineal pain: Secondary | ICD-10-CM | POA: Diagnosis not present

## 2017-11-28 DIAGNOSIS — L292 Pruritus vulvae: Secondary | ICD-10-CM | POA: Diagnosis not present

## 2017-11-29 DIAGNOSIS — M25562 Pain in left knee: Secondary | ICD-10-CM | POA: Diagnosis not present

## 2017-11-29 DIAGNOSIS — M25511 Pain in right shoulder: Secondary | ICD-10-CM | POA: Diagnosis not present

## 2017-11-29 DIAGNOSIS — M545 Low back pain: Secondary | ICD-10-CM | POA: Diagnosis not present

## 2017-11-29 DIAGNOSIS — G894 Chronic pain syndrome: Secondary | ICD-10-CM | POA: Diagnosis not present

## 2017-11-29 DIAGNOSIS — M6281 Muscle weakness (generalized): Secondary | ICD-10-CM | POA: Diagnosis not present

## 2017-11-29 DIAGNOSIS — M542 Cervicalgia: Secondary | ICD-10-CM | POA: Diagnosis not present

## 2017-11-30 DIAGNOSIS — M5481 Occipital neuralgia: Secondary | ICD-10-CM | POA: Diagnosis not present

## 2017-11-30 DIAGNOSIS — G500 Trigeminal neuralgia: Secondary | ICD-10-CM | POA: Diagnosis not present

## 2017-11-30 DIAGNOSIS — G894 Chronic pain syndrome: Secondary | ICD-10-CM | POA: Diagnosis not present

## 2017-11-30 DIAGNOSIS — M47816 Spondylosis without myelopathy or radiculopathy, lumbar region: Secondary | ICD-10-CM | POA: Diagnosis not present

## 2017-12-04 DIAGNOSIS — M6281 Muscle weakness (generalized): Secondary | ICD-10-CM | POA: Diagnosis not present

## 2017-12-04 DIAGNOSIS — M25511 Pain in right shoulder: Secondary | ICD-10-CM | POA: Diagnosis not present

## 2017-12-04 DIAGNOSIS — M542 Cervicalgia: Secondary | ICD-10-CM | POA: Diagnosis not present

## 2017-12-04 DIAGNOSIS — M25562 Pain in left knee: Secondary | ICD-10-CM | POA: Diagnosis not present

## 2017-12-04 DIAGNOSIS — G894 Chronic pain syndrome: Secondary | ICD-10-CM | POA: Diagnosis not present

## 2017-12-04 DIAGNOSIS — M545 Low back pain: Secondary | ICD-10-CM | POA: Diagnosis not present

## 2017-12-06 DIAGNOSIS — M25562 Pain in left knee: Secondary | ICD-10-CM | POA: Diagnosis not present

## 2017-12-06 DIAGNOSIS — M545 Low back pain: Secondary | ICD-10-CM | POA: Diagnosis not present

## 2017-12-06 DIAGNOSIS — M6281 Muscle weakness (generalized): Secondary | ICD-10-CM | POA: Diagnosis not present

## 2017-12-06 DIAGNOSIS — M25511 Pain in right shoulder: Secondary | ICD-10-CM | POA: Diagnosis not present

## 2017-12-06 DIAGNOSIS — G894 Chronic pain syndrome: Secondary | ICD-10-CM | POA: Diagnosis not present

## 2017-12-06 DIAGNOSIS — M542 Cervicalgia: Secondary | ICD-10-CM | POA: Diagnosis not present

## 2017-12-07 DIAGNOSIS — F4325 Adjustment disorder with mixed disturbance of emotions and conduct: Secondary | ICD-10-CM | POA: Diagnosis not present

## 2017-12-11 DIAGNOSIS — M6281 Muscle weakness (generalized): Secondary | ICD-10-CM | POA: Diagnosis not present

## 2017-12-11 DIAGNOSIS — M25511 Pain in right shoulder: Secondary | ICD-10-CM | POA: Diagnosis not present

## 2017-12-11 DIAGNOSIS — M25562 Pain in left knee: Secondary | ICD-10-CM | POA: Diagnosis not present

## 2017-12-11 DIAGNOSIS — G894 Chronic pain syndrome: Secondary | ICD-10-CM | POA: Diagnosis not present

## 2017-12-11 DIAGNOSIS — M5481 Occipital neuralgia: Secondary | ICD-10-CM | POA: Diagnosis not present

## 2017-12-11 DIAGNOSIS — M545 Low back pain: Secondary | ICD-10-CM | POA: Diagnosis not present

## 2017-12-11 DIAGNOSIS — M542 Cervicalgia: Secondary | ICD-10-CM | POA: Diagnosis not present

## 2017-12-13 DIAGNOSIS — M25562 Pain in left knee: Secondary | ICD-10-CM | POA: Diagnosis not present

## 2017-12-13 DIAGNOSIS — M6281 Muscle weakness (generalized): Secondary | ICD-10-CM | POA: Diagnosis not present

## 2017-12-13 DIAGNOSIS — G894 Chronic pain syndrome: Secondary | ICD-10-CM | POA: Diagnosis not present

## 2017-12-13 DIAGNOSIS — M542 Cervicalgia: Secondary | ICD-10-CM | POA: Diagnosis not present

## 2017-12-13 DIAGNOSIS — M25511 Pain in right shoulder: Secondary | ICD-10-CM | POA: Diagnosis not present

## 2017-12-13 DIAGNOSIS — M545 Low back pain: Secondary | ICD-10-CM | POA: Diagnosis not present

## 2017-12-25 DIAGNOSIS — F4325 Adjustment disorder with mixed disturbance of emotions and conduct: Secondary | ICD-10-CM | POA: Diagnosis not present

## 2017-12-25 DIAGNOSIS — F4542 Pain disorder with related psychological factors: Secondary | ICD-10-CM | POA: Diagnosis not present

## 2017-12-25 DIAGNOSIS — M5136 Other intervertebral disc degeneration, lumbar region: Secondary | ICD-10-CM | POA: Diagnosis not present

## 2017-12-25 DIAGNOSIS — M4306 Spondylolysis, lumbar region: Secondary | ICD-10-CM | POA: Diagnosis not present

## 2017-12-26 DIAGNOSIS — M545 Low back pain: Secondary | ICD-10-CM | POA: Diagnosis not present

## 2017-12-26 DIAGNOSIS — G894 Chronic pain syndrome: Secondary | ICD-10-CM | POA: Diagnosis not present

## 2017-12-26 DIAGNOSIS — M25511 Pain in right shoulder: Secondary | ICD-10-CM | POA: Diagnosis not present

## 2017-12-26 DIAGNOSIS — M25562 Pain in left knee: Secondary | ICD-10-CM | POA: Diagnosis not present

## 2017-12-26 DIAGNOSIS — M542 Cervicalgia: Secondary | ICD-10-CM | POA: Diagnosis not present

## 2017-12-26 DIAGNOSIS — M6281 Muscle weakness (generalized): Secondary | ICD-10-CM | POA: Diagnosis not present

## 2017-12-28 DIAGNOSIS — M25511 Pain in right shoulder: Secondary | ICD-10-CM | POA: Diagnosis not present

## 2017-12-28 DIAGNOSIS — M6281 Muscle weakness (generalized): Secondary | ICD-10-CM | POA: Diagnosis not present

## 2017-12-28 DIAGNOSIS — Z79899 Other long term (current) drug therapy: Secondary | ICD-10-CM | POA: Diagnosis not present

## 2017-12-28 DIAGNOSIS — M25562 Pain in left knee: Secondary | ICD-10-CM | POA: Diagnosis not present

## 2017-12-28 DIAGNOSIS — M5481 Occipital neuralgia: Secondary | ICD-10-CM | POA: Diagnosis not present

## 2017-12-28 DIAGNOSIS — M545 Low back pain: Secondary | ICD-10-CM | POA: Diagnosis not present

## 2017-12-28 DIAGNOSIS — Z79891 Long term (current) use of opiate analgesic: Secondary | ICD-10-CM | POA: Diagnosis not present

## 2017-12-28 DIAGNOSIS — M25519 Pain in unspecified shoulder: Secondary | ICD-10-CM | POA: Diagnosis not present

## 2017-12-28 DIAGNOSIS — M47816 Spondylosis without myelopathy or radiculopathy, lumbar region: Secondary | ICD-10-CM | POA: Diagnosis not present

## 2017-12-28 DIAGNOSIS — M542 Cervicalgia: Secondary | ICD-10-CM | POA: Diagnosis not present

## 2017-12-28 DIAGNOSIS — G894 Chronic pain syndrome: Secondary | ICD-10-CM | POA: Diagnosis not present

## 2018-01-02 ENCOUNTER — Other Ambulatory Visit: Payer: Self-pay | Admitting: Pain Medicine

## 2018-01-02 DIAGNOSIS — M25511 Pain in right shoulder: Secondary | ICD-10-CM

## 2018-01-10 ENCOUNTER — Ambulatory Visit: Payer: Medicare Other | Admitting: Neurology

## 2018-01-10 ENCOUNTER — Encounter

## 2018-01-10 ENCOUNTER — Telehealth: Payer: Self-pay | Admitting: *Deleted

## 2018-01-10 NOTE — Telephone Encounter (Signed)
Pt no showed new pt appt on 01/10/2018 at 09:00.

## 2018-01-11 ENCOUNTER — Other Ambulatory Visit: Payer: Medicare Other

## 2018-01-11 ENCOUNTER — Encounter: Payer: Self-pay | Admitting: Neurology

## 2018-01-11 ENCOUNTER — Ambulatory Visit
Admission: RE | Admit: 2018-01-11 | Discharge: 2018-01-11 | Disposition: A | Discharge status: Home / Self Care | Payer: Medicare Other | Source: Ambulatory Visit | Attending: Pain Medicine | Admitting: Pain Medicine

## 2018-01-11 DIAGNOSIS — M25511 Pain in right shoulder: Secondary | ICD-10-CM

## 2018-01-16 DIAGNOSIS — M25511 Pain in right shoulder: Secondary | ICD-10-CM | POA: Diagnosis not present

## 2018-01-16 DIAGNOSIS — M75101 Unspecified rotator cuff tear or rupture of right shoulder, not specified as traumatic: Secondary | ICD-10-CM | POA: Diagnosis not present

## 2018-01-17 DIAGNOSIS — M5136 Other intervertebral disc degeneration, lumbar region: Secondary | ICD-10-CM | POA: Diagnosis not present

## 2018-01-17 DIAGNOSIS — M47816 Spondylosis without myelopathy or radiculopathy, lumbar region: Secondary | ICD-10-CM | POA: Diagnosis not present

## 2018-01-17 DIAGNOSIS — G894 Chronic pain syndrome: Secondary | ICD-10-CM | POA: Diagnosis not present

## 2018-01-17 DIAGNOSIS — M961 Postlaminectomy syndrome, not elsewhere classified: Secondary | ICD-10-CM | POA: Diagnosis not present

## 2018-01-25 DIAGNOSIS — F4325 Adjustment disorder with mixed disturbance of emotions and conduct: Secondary | ICD-10-CM | POA: Diagnosis not present

## 2018-02-01 ENCOUNTER — Encounter: Payer: Self-pay | Admitting: *Deleted

## 2018-02-01 ENCOUNTER — Encounter: Payer: Self-pay | Admitting: Neurology

## 2018-02-01 ENCOUNTER — Ambulatory Visit (INDEPENDENT_AMBULATORY_CARE_PROVIDER_SITE_OTHER): Payer: Medicare Other | Admitting: Neurology

## 2018-02-01 ENCOUNTER — Telehealth: Payer: Self-pay | Admitting: Neurology

## 2018-02-01 ENCOUNTER — Encounter

## 2018-02-01 VITALS — BP 130/80 | Ht 63.0 in | Wt 158.0 lb

## 2018-02-01 DIAGNOSIS — G500 Trigeminal neuralgia: Secondary | ICD-10-CM | POA: Diagnosis not present

## 2018-02-01 DIAGNOSIS — G43709 Chronic migraine without aura, not intractable, without status migrainosus: Secondary | ICD-10-CM | POA: Diagnosis not present

## 2018-02-01 DIAGNOSIS — M5481 Occipital neuralgia: Secondary | ICD-10-CM | POA: Diagnosis not present

## 2018-02-01 DIAGNOSIS — G629 Polyneuropathy, unspecified: Secondary | ICD-10-CM | POA: Diagnosis not present

## 2018-02-01 DIAGNOSIS — Z79899 Other long term (current) drug therapy: Secondary | ICD-10-CM | POA: Diagnosis not present

## 2018-02-01 DIAGNOSIS — G43711 Chronic migraine without aura, intractable, with status migrainosus: Secondary | ICD-10-CM

## 2018-02-01 NOTE — Telephone Encounter (Signed)
Patient was scheduled for injection. I called he secondary plan and via automated system Botox is not covered. Medicare will pick up 80 percent but patient will be responsible for the remaining balance.   I called and made the patient aware of this. She does not want to continue with the Botox. She would like to know if she can keep the apt for Monday and receive different injections that were discussed with Dr. Jaynee Eagles. Please call and advise.

## 2018-02-01 NOTE — Progress Notes (Signed)
[facility]    Provider:  Dr Jaynee Eagles Referring Provider: Leeroy Cha,* Primary Care Physician:  Leeroy Cha, MD  CC:  Trigeminal Neuralgia  HPI:  Cassidy Olsen is a 57 y.o. female here as a referral from Dr. Fara Olden for trigeminal neuralgia.  She has a past medical history spinal stenosis, osteoarthritis, irritable bowel syndrome, high cholesterol, chronic pain, occipital neuralgia, lumbar degenerative disc disease, lumbar spondylosis, failed back surgical syndrome, low back pain, she is managed for her pain by a pain management clinic.  She is on NabuMetone, oxycodone, lidocaine, gabapentin, amitriptyline, baclofen, topiramate, celexa and Nucynta.  She has had trigeminal decompression in the past. She developed right-sided trigeminal neuralgia in 2005, prior to that the occipital neuralgia. She was diagnosed at [facility] and has seen multiple neurologists and neurosurgeons. They have had her on "everything" she had etensive testing. MRIs, CTs, MRAs and diagnosed with occipital neuralgia. She then developed Trigeminal neuralgia and say a NSY and found blood vessel compressing the trigeminal nerve and she had decompressive surgery, she had MRIs of the trigeminal nerve. She can feel keys. Her baseline level pain is a 4+, baseline is a 4/10 daily and she is at a 6 now since I am typing. She gets regular occipital blocks. She has also been seen at [facility]. She also saw doctors at [facility]. She has had sphenopalatine blocks. She has taken multiple medications including Tegretol, topamax, lyrica, neurontin, she was on about 18 medications a day. She is currently on multiple medications now.   Currently she gets nagging pain continuously on the right, movement makes it worse, she has shooting pains, spasms radiating up the occipital head, she also shooting pain down the face, exacerbated by wind, shooting pain across the face and eye, the cheek. Worsening  since March. Now having left-side symptoms. Light and sounds can trigger headaches. A dark room helps. She has significant light sensitivity, sound sensitivity, movement makes it worse, no nausea or vomiting.   Reviewed notes, labs and imaging from outside physicians, which showed:   Review of records she is had a nerve block of the greater occipital nerve.  She has a history of occipital neuralgia as well as right trigeminal neuralgia.  She has undergone decompressive surgery in [place] for the trigeminal neuralgia.  The occipital neuralgia has responded well to greater occipital nerve injections in the past.  She does not have a local neurologist.  Pain is a 7 out of 10.  Will try to request imaging from institutions she mentions    Review of Systems: Patient complains of symptoms per HPI as well as the following symptoms: Eye pain, headache, feeling hot, feeling cold, joint pain, rash, moles, not enough sleep, insomnia pertinent negatives and positives per HPI. All others negative.   Social History   Socioeconomic History  . Marital status: Single    Spouse name: Not on file  . Number of children: 1  . Years of education: Not on file  . Highest education level: Not on file  Occupational History  . Not on file  Social Needs  . Financial resource strain: Not on file  . Food insecurity:    Worry: Not on file    Inability: Not on file  . Transportation needs:    Medical: Not on file    Non-medical: Not on file  Tobacco Use  . Smoking status: Never Smoker  . Smokeless tobacco: Never Used  Substance and Sexual Activity  . Alcohol use: Yes  Comment: socially  . Drug use: No  . Sexual activity: Not on file  Lifestyle  . Physical activity:    Days per week: Not on file    Minutes per session: Not on file  . Stress: Not on file  Relationships  . Social connections:    Talks on phone: Not on file    Gets together: Not on file    Attends religious service: Not on file     Active member of club or organization: Not on file    Attends meetings of clubs or organizations: Not on file    Relationship status: Not on file  . Intimate partner violence:    Fear of current or ex partner: Not on file    Emotionally abused: Not on file    Physically abused: Not on file    Forced sexual activity: Not on file  Other Topics Concern  . Not on file  Social History Narrative  . Not on file    Family History  Problem Relation Age of Onset  . Ulcers Mother 24       peptic ulcer  . Prostate cancer Father 32  . Breast cancer Sister 27       ER+/PR+/Her2- breast cancer  . Breast cancer Paternal Grandmother   . Diabetes Paternal Grandmother   . Cancer Paternal Uncle        unknown cancers    Past Medical History:  Diagnosis Date  . Chronic pain   . Family history of breast cancer   . Family history of prostate cancer   . GERD (gastroesophageal reflux disease)   . High cholesterol   . IBS (irritable bowel syndrome)   . Migraine   . Occipital neuralgia   . Osteoarthritis   . Spinal stenosis   . Trigeminal neuralgia     Past Surgical History:  Procedure Laterality Date  . ABDOMINAL HYSTERECTOMY    . ANKLE SURGERY Left 05/2009  . APPENDECTOMY    . BACK SURGERY    . 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 Pershing Proud at [facility]  . CARPAL TUNNEL RELEASE  2014  . JOINT REPLACEMENT    . OVARIAN CYST REMOVAL  05/2010  . right knee revision  2016    Current Outpatient Medications  Medication Sig Dispense Refill  . amitriptyline (ELAVIL) 50 MG tablet Take 50 mg by mouth at bedtime.    . baclofen (LIORESAL) 10 MG tablet Take 10 mg by mouth 3 (three) times daily.    . celecoxib (CELEBREX) 200 MG capsule Take 200 mg by mouth 2 (two) times daily.    Marland Kitchen dexlansoprazole (DEXILANT) 60 MG capsule Take 60 mg by mouth daily.    . fenofibrate (TRICOR) 145 MG tablet Take by mouth.    . gabapentin (NEURONTIN) 300 MG capsule Take  300 mg by mouth 3 (three) times daily.    Marland Kitchen lubiprostone (AMITIZA) 24 MCG capsule Take 24 mcg by mouth 2 (two) times daily with a meal.    . Melatonin 10 MG TABS Take by mouth.    . tapentadol (NUCYNTA ER) 100 MG 12 hr tablet Take 100 mg by mouth every 12 (twelve) hours.    . topiramate (TOPAMAX) 100 MG tablet Take 100 mg by mouth 2 (two) times daily.     No current facility-administered medications for this visit.     Allergies as of 02/01/2018 - Review Complete 02/01/2018  Allergen Reaction Noted  .  Aspirin  11/18/2016  . Ibuprofen  11/18/2016  . Latex  11/18/2016  . Penicillins  11/18/2016    Vitals: Ht '5\' 3"'  (1.6 m)   Wt 158 lb (71.7 kg)   BMI 27.99 kg/m  Last Weight:  Wt Readings from Last 1 Encounters:  02/01/18 158 lb (71.7 kg)   Last Height:   Ht Readings from Last 1 Encounters:  02/01/18 '5\' 3"'  (1.6 m)   Physical exam: Exam: Gen: NAD, conversant, well nourised, well groomed                     CV: RRR, no MRG. No Carotid Bruits. No peripheral edema, warm, nontender Eyes: Conjunctivae clear without exudates or hemorrhage  Neuro: Detailed Neurologic Exam  Speech:    Speech is normal; fluent and spontaneous with normal comprehension.  Cognition:    The patient is oriented to person, place, and time;     recent and remote memory intact;     language fluent;     normal attention, concentration,     fund of knowledge Cranial Nerves:    The pupils are equal, round, and reactive to light. The fundi are normal and spontaneous venous pulsations are present. Visual fields are full to finger confrontation. Extraocular movements are intact. Trigeminal sensation is intact and the muscles of mastication are normal. The face is symmetric. The palate elevates in the midline. Hearing intact. Voice is normal. Shoulder shrug is normal. The tongue has normal motion without fasciculations.   Coordination:    Normal finger to nose and heel to shin. Normal rapid alternating  movements.   Gait:    Heel-toe and tandem gait are normal.   Motor Observation:    No asymmetry, no atrophy, and no involuntary movements noted. Tone:    Normal muscle tone.    Posture:    Posture is normal. normal erect    Strength:    Strength is V/V in the upper and lower limbs.      Sensation: intact to LT     Reflex Exam:  DTR's:    Deep tendon reflexes in the upper and lower extremities are normal bilaterally.   Toes:    The toes are downgoing bilaterally.   Clonus:    Clonus is absent.       Assessment/Plan:  57 y.o. female here as a referral from Dr. Fara Olden for trigeminal neuralgia.  She has a past medical history spinal stenosis, osteoarthritis, irritable bowel syndrome, high cholesterol, chronic pain, occipital neuralgia, lumbar degenerative disc disease, lumbar spondylosis, failed back surgical syndrome, low back pain, she is managed for her pain by a pain management clinic.  -Patient has been evaluated and treated by multiple institutions, neurologist and neurosurgeons including prestigious centers such as [place], [place], [facility].  She has had extensive imaging including MRIs with trigeminal protocol, MRAs, as well as decompressive surgery for vascular loop that was compressing the trigeminal nerve.  She has tried a multitude of medications in the past open but this is at one point she says 18 concurrent medications for her occipital trigeminal neuralgia) and right now she is on at least 4 different medications that treat these conditions including gabapentin, amitriptyline, baclofen, topiramate and she is being managed with narcotics through a chronic pain center.   - At this point we really have limited treatments left(see above). she has a history of migraines and symptoms could be intertwined with a migraine disorder I recommend Botox which may help  her migraine disorder as well as occipital and trigeminal pain.  There is a copayment for  botox every 3 months and patient does not want to pay that.  I can provide sphenopalatine ganglion blocks. She can continue occipital nerve blocks at pain center.  I also recommend [facility] for radiofrequency ablation of the trigeminal and possibly the occipital nerve.  Cbc/cmp  Orders Placed This Encounter  Procedures  . CBC  . Comprehensive metabolic panel  . Ambulatory referral to Interventional Radiology   Cc: Leeroy Cha, MD, Dr. Dian Situ  Sarina Ill, MD  [facility] [address] [place], [place] [address]  Phone [phone] Fax [phone]

## 2018-02-01 NOTE — Telephone Encounter (Signed)
Called pt & LVM asking for call back. When she calls back, please tell her that is fine, she can still receive the other injections per Dr. Jaynee Eagles instead of Botox.   I have already changed her appt type to injection instead of botox.

## 2018-02-05 ENCOUNTER — Encounter: Payer: Self-pay | Admitting: Neurology

## 2018-02-05 ENCOUNTER — Ambulatory Visit (INDEPENDENT_AMBULATORY_CARE_PROVIDER_SITE_OTHER): Payer: Medicare Other | Admitting: Neurology

## 2018-02-05 VITALS — BP 133/93 | HR 110

## 2018-02-05 DIAGNOSIS — M5481 Occipital neuralgia: Secondary | ICD-10-CM | POA: Diagnosis not present

## 2018-02-05 DIAGNOSIS — R519 Headache, unspecified: Secondary | ICD-10-CM

## 2018-02-05 DIAGNOSIS — M7918 Myalgia, other site: Secondary | ICD-10-CM | POA: Diagnosis not present

## 2018-02-05 DIAGNOSIS — G500 Trigeminal neuralgia: Secondary | ICD-10-CM

## 2018-02-05 DIAGNOSIS — R51 Headache: Secondary | ICD-10-CM

## 2018-02-05 DIAGNOSIS — G8929 Other chronic pain: Secondary | ICD-10-CM

## 2018-02-05 MED ORDER — DIPHENHYDRAMINE HCL 25 MG PO TABS: 50.0000 mg | ORAL_TABLET | Freq: Three times a day (TID) | ORAL | 0 refills | Status: DC | PRN

## 2018-02-05 NOTE — Addendum Note (Signed)
Addended by: Sarina Ill B on: 02/05/2018 01:39 PM   Modules accepted: Orders

## 2018-02-05 NOTE — Addendum Note (Signed)
Addended by: Sarina Ill B on: 02/05/2018 01:36 PM   Modules accepted: Orders

## 2018-02-05 NOTE — Progress Notes (Addendum)
Cervical myofascial pain: Cassidy Olsen on [employer] dry needling  Orders Placed This Encounter  Procedures  . Ambulatory referral to Physical Therapy    Performed by Dr. Jaynee Eagles M.D.  All procedures a documented blood were medically necessary, reasonable and appropriate based on the patient's history, medical diagnosis and physician opinion. Verbal informed consent was obtained from the patient, patient was informed of potential risk of procedure, including bruising, bleeding, hematoma formation, infection, muscle weakness, muscle pain, numbness, transient hypertension, transient hyperglycemia and transient insomnia among others. All areas injected were topically clean with isopropyl rubbing alcohol. Nonsterile nonlatex gloves were worn during the procedure.  1. Greater occipital nerve block [phone]). The greater occipital nerve site was identified at the nuchal line medial to the occipital artery. Medication was injected into the right occipital nerve areas and suboccipital areas. Patient's condition is associated with inflammation of the greater occipital nerve and associated multiple groups. Injection was deemed medically necessary, reasonable and appropriate. Injection represents a separate and unique surgical service.  2. Supraorbital nerve block (64400): Supraorbital nerve site was identified along the incision of the frontal bone on the orbital/supraorbital ridge. Medication was injected into the right supraorbital nerve areas. Patient's condition is associated with inflammation of the supraorbital and associated muscle groups. Injection was deemed medically necessary, reasonable and appropriate. Injection represents a separate and unique surgical service.  3. Trigger point: right trapezius, right levator scapulae, right paraspinal muscles [phone]  Patient called due to itching of face. I asked her to return to clinic, when she arrived there were no symptoms, no lacrimation or redness of eyes, no  swelling, she was asymptomatic and comfortable sais symptoms resolved except itchy tongue(tongue exam normal) recommended benadryl at home.

## 2018-02-05 NOTE — Progress Notes (Signed)
Nerve Block without steroid.  Informed consent signed.   2% Lidocaine  - 6 ml Lot: [id number] Expiration: 02/15/18 NDC: 4830-7354-30  Bupivacaine 0.5% - 10mL total Lot: [id number] Expiration: 04/2021 NDC: 79536-922-30  Dx: G50.0 O97.949

## 2018-02-15 DIAGNOSIS — M545 Low back pain: Secondary | ICD-10-CM | POA: Diagnosis not present

## 2018-02-15 DIAGNOSIS — M542 Cervicalgia: Secondary | ICD-10-CM | POA: Diagnosis not present

## 2018-02-15 DIAGNOSIS — M25511 Pain in right shoulder: Secondary | ICD-10-CM | POA: Diagnosis not present

## 2018-02-15 DIAGNOSIS — G894 Chronic pain syndrome: Secondary | ICD-10-CM | POA: Diagnosis not present

## 2018-02-15 DIAGNOSIS — M6281 Muscle weakness (generalized): Secondary | ICD-10-CM | POA: Diagnosis not present

## 2018-02-15 DIAGNOSIS — M25562 Pain in left knee: Secondary | ICD-10-CM | POA: Diagnosis not present

## 2018-02-19 DIAGNOSIS — M6281 Muscle weakness (generalized): Secondary | ICD-10-CM | POA: Diagnosis not present

## 2018-02-19 DIAGNOSIS — M25511 Pain in right shoulder: Secondary | ICD-10-CM | POA: Diagnosis not present

## 2018-02-19 DIAGNOSIS — J45901 Unspecified asthma with (acute) exacerbation: Secondary | ICD-10-CM | POA: Diagnosis not present

## 2018-02-19 DIAGNOSIS — G894 Chronic pain syndrome: Secondary | ICD-10-CM | POA: Diagnosis not present

## 2018-02-19 DIAGNOSIS — M179 Osteoarthritis of knee, unspecified: Secondary | ICD-10-CM | POA: Diagnosis not present

## 2018-02-19 DIAGNOSIS — M542 Cervicalgia: Secondary | ICD-10-CM | POA: Diagnosis not present

## 2018-02-19 DIAGNOSIS — E785 Hyperlipidemia, unspecified: Secondary | ICD-10-CM | POA: Diagnosis not present

## 2018-02-19 DIAGNOSIS — M545 Low back pain: Secondary | ICD-10-CM | POA: Diagnosis not present

## 2018-02-19 DIAGNOSIS — J42 Unspecified chronic bronchitis: Secondary | ICD-10-CM | POA: Diagnosis not present

## 2018-02-19 DIAGNOSIS — M25562 Pain in left knee: Secondary | ICD-10-CM | POA: Diagnosis not present

## 2018-02-20 ENCOUNTER — Ambulatory Visit: Payer: Medicare Other | Admitting: Physical Therapy

## 2018-02-21 DIAGNOSIS — M25562 Pain in left knee: Secondary | ICD-10-CM | POA: Diagnosis not present

## 2018-02-21 DIAGNOSIS — G894 Chronic pain syndrome: Secondary | ICD-10-CM | POA: Diagnosis not present

## 2018-02-21 DIAGNOSIS — M545 Low back pain: Secondary | ICD-10-CM | POA: Diagnosis not present

## 2018-02-21 DIAGNOSIS — M542 Cervicalgia: Secondary | ICD-10-CM | POA: Diagnosis not present

## 2018-02-21 DIAGNOSIS — M25511 Pain in right shoulder: Secondary | ICD-10-CM | POA: Diagnosis not present

## 2018-02-21 DIAGNOSIS — M6281 Muscle weakness (generalized): Secondary | ICD-10-CM | POA: Diagnosis not present

## 2018-02-26 DIAGNOSIS — G894 Chronic pain syndrome: Secondary | ICD-10-CM | POA: Diagnosis not present

## 2018-02-26 DIAGNOSIS — M542 Cervicalgia: Secondary | ICD-10-CM | POA: Diagnosis not present

## 2018-02-26 DIAGNOSIS — M545 Low back pain: Secondary | ICD-10-CM | POA: Diagnosis not present

## 2018-02-26 DIAGNOSIS — M25562 Pain in left knee: Secondary | ICD-10-CM | POA: Diagnosis not present

## 2018-02-26 DIAGNOSIS — M6281 Muscle weakness (generalized): Secondary | ICD-10-CM | POA: Diagnosis not present

## 2018-02-26 DIAGNOSIS — M25511 Pain in right shoulder: Secondary | ICD-10-CM | POA: Diagnosis not present

## 2018-03-01 DIAGNOSIS — Z79891 Long term (current) use of opiate analgesic: Secondary | ICD-10-CM | POA: Diagnosis not present

## 2018-03-01 DIAGNOSIS — M542 Cervicalgia: Secondary | ICD-10-CM | POA: Diagnosis not present

## 2018-03-01 DIAGNOSIS — M25562 Pain in left knee: Secondary | ICD-10-CM | POA: Diagnosis not present

## 2018-03-01 DIAGNOSIS — M47816 Spondylosis without myelopathy or radiculopathy, lumbar region: Secondary | ICD-10-CM | POA: Diagnosis not present

## 2018-03-01 DIAGNOSIS — M25511 Pain in right shoulder: Secondary | ICD-10-CM | POA: Diagnosis not present

## 2018-03-01 DIAGNOSIS — G894 Chronic pain syndrome: Secondary | ICD-10-CM | POA: Diagnosis not present

## 2018-03-01 DIAGNOSIS — M545 Low back pain: Secondary | ICD-10-CM | POA: Diagnosis not present

## 2018-03-01 DIAGNOSIS — M6281 Muscle weakness (generalized): Secondary | ICD-10-CM | POA: Diagnosis not present

## 2018-03-01 DIAGNOSIS — Z79899 Other long term (current) drug therapy: Secondary | ICD-10-CM | POA: Diagnosis not present

## 2018-03-06 ENCOUNTER — Ambulatory Visit: Payer: Medicare Other | Admitting: Neurology

## 2018-03-16 DIAGNOSIS — J029 Acute pharyngitis, unspecified: Secondary | ICD-10-CM | POA: Diagnosis not present

## 2018-03-17 DIAGNOSIS — J029 Acute pharyngitis, unspecified: Secondary | ICD-10-CM | POA: Diagnosis not present

## 2018-03-20 ENCOUNTER — Ambulatory Visit: Payer: Medicare Other | Admitting: Neurology

## 2018-03-20 ENCOUNTER — Encounter

## 2018-03-21 DIAGNOSIS — D492 Neoplasm of unspecified behavior of bone, soft tissue, and skin: Secondary | ICD-10-CM | POA: Diagnosis not present

## 2018-03-21 DIAGNOSIS — D3612 Benign neoplasm of peripheral nerves and autonomic nervous system, upper limb, including shoulder: Secondary | ICD-10-CM | POA: Diagnosis not present

## 2018-03-21 DIAGNOSIS — R9389 Abnormal findings on diagnostic imaging of other specified body structures: Secondary | ICD-10-CM | POA: Diagnosis not present

## 2018-03-27 DIAGNOSIS — M545 Low back pain: Secondary | ICD-10-CM | POA: Diagnosis not present

## 2018-03-27 DIAGNOSIS — M25562 Pain in left knee: Secondary | ICD-10-CM | POA: Diagnosis not present

## 2018-03-27 DIAGNOSIS — M542 Cervicalgia: Secondary | ICD-10-CM | POA: Diagnosis not present

## 2018-03-27 DIAGNOSIS — G894 Chronic pain syndrome: Secondary | ICD-10-CM | POA: Diagnosis not present

## 2018-03-27 DIAGNOSIS — M6281 Muscle weakness (generalized): Secondary | ICD-10-CM | POA: Diagnosis not present

## 2018-03-27 DIAGNOSIS — M25511 Pain in right shoulder: Secondary | ICD-10-CM | POA: Diagnosis not present

## 2018-03-29 DIAGNOSIS — G894 Chronic pain syndrome: Secondary | ICD-10-CM | POA: Diagnosis not present

## 2018-03-29 DIAGNOSIS — M25511 Pain in right shoulder: Secondary | ICD-10-CM | POA: Diagnosis not present

## 2018-03-29 DIAGNOSIS — M545 Low back pain: Secondary | ICD-10-CM | POA: Diagnosis not present

## 2018-03-29 DIAGNOSIS — M6281 Muscle weakness (generalized): Secondary | ICD-10-CM | POA: Diagnosis not present

## 2018-03-29 DIAGNOSIS — M25562 Pain in left knee: Secondary | ICD-10-CM | POA: Diagnosis not present

## 2018-03-29 DIAGNOSIS — M542 Cervicalgia: Secondary | ICD-10-CM | POA: Diagnosis not present

## 2018-03-30 DIAGNOSIS — M47816 Spondylosis without myelopathy or radiculopathy, lumbar region: Secondary | ICD-10-CM | POA: Diagnosis not present

## 2018-03-30 DIAGNOSIS — M25519 Pain in unspecified shoulder: Secondary | ICD-10-CM | POA: Diagnosis not present

## 2018-03-30 DIAGNOSIS — M5481 Occipital neuralgia: Secondary | ICD-10-CM | POA: Diagnosis not present

## 2018-03-30 DIAGNOSIS — G894 Chronic pain syndrome: Secondary | ICD-10-CM | POA: Diagnosis not present

## 2018-04-02 DIAGNOSIS — G589 Mononeuropathy, unspecified: Secondary | ICD-10-CM | POA: Diagnosis not present

## 2018-04-02 DIAGNOSIS — J449 Chronic obstructive pulmonary disease, unspecified: Secondary | ICD-10-CM | POA: Diagnosis not present

## 2018-04-02 DIAGNOSIS — G894 Chronic pain syndrome: Secondary | ICD-10-CM | POA: Diagnosis not present

## 2018-04-03 ENCOUNTER — Other Ambulatory Visit: Payer: Self-pay | Admitting: Pain Medicine

## 2018-04-03 DIAGNOSIS — M25619 Stiffness of unspecified shoulder, not elsewhere classified: Secondary | ICD-10-CM

## 2018-04-03 DIAGNOSIS — G894 Chronic pain syndrome: Secondary | ICD-10-CM | POA: Diagnosis not present

## 2018-04-03 DIAGNOSIS — M25562 Pain in left knee: Secondary | ICD-10-CM | POA: Diagnosis not present

## 2018-04-03 DIAGNOSIS — M545 Low back pain: Secondary | ICD-10-CM | POA: Diagnosis not present

## 2018-04-03 DIAGNOSIS — M6281 Muscle weakness (generalized): Secondary | ICD-10-CM | POA: Diagnosis not present

## 2018-04-03 DIAGNOSIS — M25511 Pain in right shoulder: Secondary | ICD-10-CM

## 2018-04-03 DIAGNOSIS — M542 Cervicalgia: Secondary | ICD-10-CM | POA: Diagnosis not present

## 2018-04-05 ENCOUNTER — Telehealth: Payer: Self-pay | Admitting: Neurology

## 2018-04-05 DIAGNOSIS — F4325 Adjustment disorder with mixed disturbance of emotions and conduct: Secondary | ICD-10-CM | POA: Diagnosis not present

## 2018-04-05 DIAGNOSIS — M25511 Pain in right shoulder: Secondary | ICD-10-CM | POA: Diagnosis not present

## 2018-04-05 NOTE — Telephone Encounter (Signed)
I have called patient and left her a message stating that [facility] neurosurgery is requesting that she get's her medical records form  Her past Neurosurgeons and neurologist for Trigeminal. Per Mateo Flow she will need records first  in order to be able to schedule   Patient .   Mateo Flow fax [phone]  -- telephone [phone]

## 2018-04-10 ENCOUNTER — Ambulatory Visit
Admission: RE | Admit: 2018-04-10 | Discharge: 2018-04-10 | Disposition: A | Discharge status: Home / Self Care | Payer: Medicare Other | Source: Ambulatory Visit | Attending: Pain Medicine | Admitting: Pain Medicine

## 2018-04-10 DIAGNOSIS — M545 Low back pain: Secondary | ICD-10-CM | POA: Diagnosis not present

## 2018-04-10 DIAGNOSIS — M25562 Pain in left knee: Secondary | ICD-10-CM | POA: Diagnosis not present

## 2018-04-10 DIAGNOSIS — G894 Chronic pain syndrome: Secondary | ICD-10-CM | POA: Diagnosis not present

## 2018-04-10 DIAGNOSIS — M25511 Pain in right shoulder: Secondary | ICD-10-CM

## 2018-04-10 DIAGNOSIS — M6281 Muscle weakness (generalized): Secondary | ICD-10-CM | POA: Diagnosis not present

## 2018-04-10 DIAGNOSIS — M542 Cervicalgia: Secondary | ICD-10-CM | POA: Diagnosis not present

## 2018-04-10 DIAGNOSIS — M25619 Stiffness of unspecified shoulder, not elsewhere classified: Secondary | ICD-10-CM

## 2018-04-12 DIAGNOSIS — M542 Cervicalgia: Secondary | ICD-10-CM | POA: Diagnosis not present

## 2018-04-12 DIAGNOSIS — M6281 Muscle weakness (generalized): Secondary | ICD-10-CM | POA: Diagnosis not present

## 2018-04-12 DIAGNOSIS — M545 Low back pain: Secondary | ICD-10-CM | POA: Diagnosis not present

## 2018-04-12 DIAGNOSIS — M25511 Pain in right shoulder: Secondary | ICD-10-CM | POA: Diagnosis not present

## 2018-04-12 DIAGNOSIS — M25562 Pain in left knee: Secondary | ICD-10-CM | POA: Diagnosis not present

## 2018-04-12 DIAGNOSIS — G894 Chronic pain syndrome: Secondary | ICD-10-CM | POA: Diagnosis not present

## 2018-04-12 NOTE — Telephone Encounter (Signed)
Called patient and left her another voicemail asking her to call me back.

## 2018-04-15 ENCOUNTER — Ambulatory Visit
Admission: RE | Admit: 2018-04-15 | Discharge: 2018-04-15 | Disposition: A | Discharge status: Home / Self Care | Payer: Medicare Other | Source: Ambulatory Visit | Attending: Pain Medicine | Admitting: Pain Medicine

## 2018-04-15 DIAGNOSIS — M75101 Unspecified rotator cuff tear or rupture of right shoulder, not specified as traumatic: Secondary | ICD-10-CM | POA: Diagnosis not present

## 2018-04-15 MED ORDER — GADOBENATE DIMEGLUMINE 529 MG/ML IV SOLN
15.0000 mL | Freq: Once | INTRAVENOUS | Status: AC | PRN
  Administered 2018-04-15: 15 mL via INTRAVENOUS

## 2018-04-16 NOTE — Telephone Encounter (Signed)
Called and left patient another message to get her scheduled.

## 2018-04-17 ENCOUNTER — Other Ambulatory Visit: Payer: Self-pay | Admitting: Internal Medicine

## 2018-04-17 ENCOUNTER — Ambulatory Visit
Admission: RE | Admit: 2018-04-17 | Discharge: 2018-04-17 | Disposition: A | Discharge status: Home / Self Care | Payer: Medicare Other | Source: Ambulatory Visit | Attending: Internal Medicine | Admitting: Internal Medicine

## 2018-04-17 DIAGNOSIS — J449 Chronic obstructive pulmonary disease, unspecified: Secondary | ICD-10-CM

## 2018-04-17 DIAGNOSIS — M25511 Pain in right shoulder: Secondary | ICD-10-CM | POA: Diagnosis not present

## 2018-04-17 DIAGNOSIS — J189 Pneumonia, unspecified organism: Secondary | ICD-10-CM | POA: Diagnosis not present

## 2018-04-17 DIAGNOSIS — M7501 Adhesive capsulitis of right shoulder: Secondary | ICD-10-CM | POA: Diagnosis not present

## 2018-04-17 NOTE — Progress Notes (Addendum)
Synopsis: Referred in September 2019 for asthma by Leeroy Cha,*  Subjective:   PATIENT ID: Cassidy Olsen Agee GENDER: female DOB: 31-Jan-1961, MRN: [id number]  Chief Complaint  Patient presents with  . Consult    Repeated PNE and Bronchitis, URI. States it starts with severe cough with green mucous production, now has withdrawal symptoms from cough medicine. Symbicort daily and Albuterol prn.    Seen today for SOB. First week of august she was sick, cough, fevers, chills. She was treated with azithromycin. She has pain her left chest that she describes as pleuritic. The pain lasted for about 3 weeks. 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 The MRI revealed an abnormality in the chest.   She started to feel some better. She still has a dry cough. Her SOB is better and no issue with climbing stairs. Of note, she has had several bouts of bronchitis. Routinely, usually 1-2 times per year with bronchitis. She was told that she may have chronic bronchitis.   Patient does have seasonal allergies.  She does use Flonase for allergic rhinitis.  This is usually worse in the fall and early spring time.  She denies aspirin and sensitivity.  She does have a history of asthma.  She had episodic wheezing diagnosed in her mid 1s.  She stated she was much heavier at this time.  And once losing significant amount awake her respiratory symptoms improved.  She does not have any pets, birds, dogs or cats in the home.  She is retired and works Retail buyer.  She was started on Symbicort as well as as needed albuterol by her primary care provider which states has improved her symptoms.  She has not been terribly compliant with the use of Symbicort.   Past Medical History:  Diagnosis Date  . Chronic pain   . Family history of breast cancer   . Family history of prostate cancer   . GERD (gastroesophageal reflux disease)   . High cholesterol   . IBS (irritable bowel syndrome)    . Migraine   . Occipital neuralgia   . Osteoarthritis   . Spinal stenosis   . Trigeminal neuralgia      Family History  Problem Relation Age of Onset  . Ulcers Mother 83       peptic ulcer  . Prostate cancer Father 41  . Breast cancer Sister 18       ER+/PR+/Her2- breast cancer  . Breast cancer Paternal Grandmother   . Diabetes Paternal Grandmother   . Cancer Paternal Uncle        unknown cancers     Social History   Socioeconomic History  . Marital status: Single    Spouse name: Not on file  . Number of children: 1  . Years of education: Not on file  . Highest education level: Not on file  Occupational History  . Not on file  Social Needs  . Financial resource strain: Not on file  . Food insecurity:    Worry: Not on file    Inability: Not on file  . Transportation needs:    Medical: Not on file    Non-medical: Not on file  Tobacco Use  . Smoking status: Never Smoker  . Smokeless tobacco: Never Used  Substance and Sexual Activity  . Alcohol use: Yes    Comment: socially  . Drug use: No  . Sexual activity: Not on file  Lifestyle  . Physical activity:  Days per week: Not on file    Minutes per session: Not on file  . Stress: Not on file  Relationships  . Social connections:    Talks on phone: Not on file    Gets together: Not on file    Attends religious service: Not on file    Active member of club or organization: Not on file    Attends meetings of clubs or organizations: Not on file    Relationship status: Not on file  . Intimate partner violence:    Fear of current or ex partner: Not on file    Emotionally abused: Not on file    Physically abused: Not on file    Forced sexual activity: Not on file  Other Topics Concern  . Not on file  Social History Narrative  . Not on file     Allergies  Allergen Reactions  . Aspirin   . Ibuprofen   . Latex   . Penicillins      Outpatient Medications Prior to Visit  Medication Sig Dispense Refill    . amitriptyline (ELAVIL) 50 MG tablet Take 50 mg by mouth at bedtime.    . baclofen (LIORESAL) 10 MG tablet Take 10 mg by mouth daily.     . celecoxib (CELEBREX) 200 MG capsule Take 200 mg by mouth daily.     Marland Kitchen dexlansoprazole (DEXILANT) 60 MG capsule Take 60 mg by mouth daily.    . fenofibrate (TRICOR) 145 MG tablet Take by mouth.    . gabapentin (NEURONTIN) 300 MG capsule Take 300 mg by mouth 3 (three) times daily.    Marland Kitchen lubiprostone (AMITIZA) 24 MCG capsule Take 24 mcg by mouth 2 (two) times daily with a meal.    . Melatonin 10 MG TABS Take by mouth.    . tapentadol (NUCYNTA ER) 100 MG 12 hr tablet Take 100 mg by mouth every 12 (twelve) hours.    . topiramate (TOPAMAX) 100 MG tablet Take 100 mg by mouth 2 (two) times daily.    . diphenhydrAMINE (BENADRYL ALLERGY) 25 MG tablet Take 2 tablets (50 mg total) by mouth every 8 (eight) hours as needed. (Patient not taking: Reported on 04/18/2018) 10 tablet 0   No facility-administered medications prior to visit.     Review of Systems  Constitutional: Negative for chills, fever, malaise/fatigue and weight loss.  HENT: Negative for hearing loss, sore throat and tinnitus.   Eyes: Negative for blurred vision and double vision.  Respiratory: Positive for cough and shortness of breath. Negative for hemoptysis, sputum production, wheezing and stridor.        DOE, dry cough, non-productive   Cardiovascular: Negative for chest pain, palpitations, orthopnea, leg swelling and PND.  Gastrointestinal: Negative for abdominal pain, constipation, diarrhea, heartburn, nausea and vomiting.  Genitourinary: Negative for dysuria, hematuria and urgency.  Musculoskeletal: Negative for joint pain and myalgias.  Skin: Negative for itching and rash.  Neurological: Negative for dizziness, tingling, weakness and headaches.  Endo/Heme/Allergies: Negative for environmental allergies. Does not bruise/bleed easily.  Psychiatric/Behavioral: Negative for depression. The  patient is not nervous/anxious and does not have insomnia.   All other systems reviewed and are negative.    Objective:  Physical Exam  Constitutional: She is oriented to person, place, and time. She appears well-developed and well-nourished. No distress.  HENT:  Head: Normocephalic and atraumatic.  Mouth/Throat: Oropharynx is clear and moist.  No nasal polyps  Eyes: Pupils are equal, round, and reactive to light. Conjunctivae are normal.  No scleral icterus.  Neck: Neck supple. No JVD present. No tracheal deviation present.  Cardiovascular: Normal rate, regular rhythm, normal heart sounds and intact distal pulses.  No murmur heard. Pulmonary/Chest: Effort normal and breath sounds normal. No accessory muscle usage or stridor. No tachypnea. No respiratory distress. She has no wheezes. She has no rhonchi. She has no rales.  Abdominal: Soft. Bowel sounds are normal. She exhibits no distension. There is no tenderness.  Musculoskeletal: She exhibits no edema or tenderness.  Lymphadenopathy:    She has no cervical adenopathy.  Neurological: She is alert and oriented to person, place, and time.  Skin: Skin is warm and dry. Capillary refill takes less than 2 seconds. No rash noted.  Psychiatric: She has a normal mood and affect. Her behavior is normal.  Vitals reviewed.    Vitals:   04/18/18 0907  BP: 118/80  Pulse: 94  SpO2: 99%  Weight: 153 lb 9.6 oz (69.7 kg)  Height: 5' 2.5" (1.588 m)   99% on RA BMI Readings from Last 3 Encounters:  04/18/18 27.65 kg/m  02/01/18 27.99 kg/m  11/18/16 27.28 kg/m   Wt Readings from Last 3 Encounters:  04/18/18 153 lb 9.6 oz (69.7 kg)  02/01/18 158 lb (71.7 kg)  11/18/16 154 lb (69.9 kg)   CBC    Component Value Date/Time   WBC 7.8 11/18/2016 2303   RBC 4.05 11/18/2016 2303   HGB 12.9 11/18/2016 2303   HCT 38.2 11/18/2016 2303   PLT 353 11/18/2016 2303   MCV 94.3 11/18/2016 2303   MCH 31.9 11/18/2016 2303   MCHC 33.8 11/18/2016  2303   RDW 13.0 11/18/2016 2303   LYMPHSABS 2.8 11/18/2016 2303   MONOABS 0.6 11/18/2016 2303   EOSABS 0.1 11/18/2016 2303   BASOSABS 0.0 11/18/2016 2303    Chest Imaging:  CXR 04/17/2018: Left sided nodular density   Pulmonary Functions Testing Results: No results found for: FEV1, FVC, FEV1FVC, TLC, DLCO  FeNO: None  Pathology: None  Echocardiogram: None  Heart Catheterization: None    Assessment & Plan:   Lung nodule - Plan: CBC w/Diff, IgE, Pulmonary Function Test, Resp Allergy Profile Regn2DC [place] [facility] [place] [facility]  Solitary pulmonary nodule - Plan: CT CHEST WO CONTRAST, CBC w/Diff, IgE, Pulmonary Function Test, Resp Allergy Profile Regn2DC [place] [facility] [place] [facility]  SOB (shortness of breath) - Plan: CBC w/Diff, IgE, Pulmonary Function Test, Resp Allergy Profile Regn2DC DE [facility] [place] [facility]  DOE (dyspnea on exertion) - Plan: CBC w/Diff, IgE, Pulmonary Function Test, Resp Allergy Profile Regn2DC DE [facility] [place] [facility]  Seasonal allergies - Plan: CBC w/Diff, IgE, Pulmonary Function Test, Resp Allergy Profile Regn2DC DE [facility] [place] [facility]  Seasonal allergic rhinitis, unspecified trigger - Plan: CBC w/Diff, IgE, Pulmonary Function Test, Resp Allergy Profile Regn2DC DE [facility] [place] [facility]  Discussion:  This is a 57 year old female with history of allergic rhinitis as well as a prior diagnosis of asthma in her early 34s.  She was much heavier at this time and her respiratory symptoms have decreased since she has lost a significant weight.  She has a history of recurrent bronchitis concerning for episodes of chronic bronchitis.  And a recent bout of community acquired pneumonia treated with azithromycin.  I suspect the abnormality seen in her left chest on x-ray is resolving pneumonia from the first week of August.  Her history and exam are consistent with possible underlying bronchial reactivity and concerning for diagnosis of probable asthma.  We will obtain blood work  today: CBC, IgE, RAST to help delineate underlying phenotype of  asthma. Continue Symbicort 2 puffs twice daily with spacer. Continue albuterol inhaler as needed for shortness of breath Start Singulair 10 mg daily She can continue use of her Flonase. Will obtain a non-contrasted CT chest for further evaluation of the nodular density seen on CXR. Full PFTs Return to clinic in 6 weeks    Current Outpatient Medications:  .  amitriptyline (ELAVIL) 50 MG tablet, Take 50 mg by mouth at bedtime., Disp: , Rfl:  .  baclofen (LIORESAL) 10 MG tablet, Take 10 mg by mouth daily. , Disp: , Rfl:  .  celecoxib (CELEBREX) 200 MG capsule, Take 200 mg by mouth daily. , Disp: , Rfl:  .  dexlansoprazole (DEXILANT) 60 MG capsule, Take 60 mg by mouth daily., Disp: , Rfl:  .  fenofibrate (TRICOR) 145 MG tablet, Take by mouth., Disp: , Rfl:  .  gabapentin (NEURONTIN) 300 MG capsule, Take 300 mg by mouth 3 (three) times daily., Disp: , Rfl:  .  lubiprostone (AMITIZA) 24 MCG capsule, Take 24 mcg by mouth 2 (two) times daily with a meal., Disp: , Rfl:  .  Melatonin 10 MG TABS, Take by mouth., Disp: , Rfl:  .  tapentadol (NUCYNTA ER) 100 MG 12 hr tablet, Take 100 mg by mouth every 12 (twelve) hours., Disp: , Rfl:  .  topiramate (TOPAMAX) 100 MG tablet, Take 100 mg by mouth 2 (two) times daily., Disp: , Rfl:  .  diphenhydrAMINE (BENADRYL ALLERGY) 25 MG tablet, Take 2 tablets (50 mg total) by mouth every 8 (eight) hours as needed. (Patient not taking: Reported on 04/18/2018), Disp: 10 tablet, Rfl: 0 .  montelukast (SINGULAIR) 10 MG tablet, Take 1 tablet (10 mg total) by mouth at bedtime., Disp: 30 tablet, Rfl: [place], DO [facility] 04/18/2018 9:49 AM

## 2018-04-18 ENCOUNTER — Ambulatory Visit
Admission: RE | Admit: 2018-04-18 | Discharge: 2018-04-18 | Disposition: A | Discharge status: Home / Self Care | Payer: Medicare Other | Source: Ambulatory Visit | Attending: Pulmonary Disease | Admitting: Pulmonary Disease

## 2018-04-18 ENCOUNTER — Ambulatory Visit (INDEPENDENT_AMBULATORY_CARE_PROVIDER_SITE_OTHER): Payer: Medicare Other | Admitting: Pulmonary Disease

## 2018-04-18 ENCOUNTER — Telehealth: Payer: Self-pay | Admitting: Pulmonary Disease

## 2018-04-18 ENCOUNTER — Other Ambulatory Visit: Payer: Medicare Other

## 2018-04-18 ENCOUNTER — Encounter: Payer: Self-pay | Admitting: Pulmonary Disease

## 2018-04-18 VITALS — BP 118/80 | HR 94 | Ht 62.5 in | Wt 153.6 lb

## 2018-04-18 DIAGNOSIS — J302 Other seasonal allergic rhinitis: Secondary | ICD-10-CM | POA: Diagnosis not present

## 2018-04-18 DIAGNOSIS — R0609 Other forms of dyspnea: Secondary | ICD-10-CM

## 2018-04-18 DIAGNOSIS — J984 Other disorders of lung: Secondary | ICD-10-CM | POA: Diagnosis not present

## 2018-04-18 DIAGNOSIS — R911 Solitary pulmonary nodule: Secondary | ICD-10-CM | POA: Diagnosis not present

## 2018-04-18 DIAGNOSIS — R0602 Shortness of breath: Secondary | ICD-10-CM | POA: Diagnosis not present

## 2018-04-18 DIAGNOSIS — R0600 Dyspnea, unspecified: Secondary | ICD-10-CM

## 2018-04-18 MED ORDER — AEROCHAMBER MV MISC: 0 refills | Status: DC

## 2018-04-18 MED ORDER — MONTELUKAST SODIUM 10 MG PO TABS: 10.0000 mg | ORAL_TABLET | Freq: Every day | ORAL | 11 refills | Status: DC

## 2018-04-18 NOTE — Telephone Encounter (Signed)
She is going to [place] and [place] reordered labs  Thanks BLI

## 2018-04-18 NOTE — Telephone Encounter (Signed)
Unable to reach patient left message to give [place] a call back. Will route to BLI as [facility]

## 2018-04-18 NOTE — Addendum Note (Signed)
Addended by: Vivia Ewing on: 04/18/2018 10:50 AM   Modules accepted: Orders

## 2018-04-18 NOTE — Patient Instructions (Signed)
We will obtain blood work today: CBC, IgE, RAST  Continue Symbicort 2 puffs twice daily with spacer. Continue albuterol inhaler as needed for shortness of breath Start Singulair 10 mg daily CT chest ordered for evaluation of abnormality seen on chest x-ray. RTC: 6 weeks

## 2018-04-19 ENCOUNTER — Telehealth: Payer: Self-pay | Admitting: Pulmonary Disease

## 2018-04-19 DIAGNOSIS — R918 Other nonspecific abnormal finding of lung field: Secondary | ICD-10-CM

## 2018-04-19 DIAGNOSIS — R911 Solitary pulmonary nodule: Secondary | ICD-10-CM

## 2018-04-19 LAB — INTERPRETATION:

## 2018-04-19 LAB — RESPIRATORY ALLERGY PROFILE REGION II ~~LOC~~

## 2018-04-19 LAB — CBC WITH DIFFERENTIAL/PLATELET
Basophils Absolute: 73 cells/uL (ref 0–200)
Basophils Relative: 0.9 %
Eosinophils Absolute: 211 cells/uL (ref 15–500)
Eosinophils Relative: 2.6 %
HCT: 36.5 % (ref 35.0–45.0)
Hemoglobin: 12.3 g/dL (ref 11.7–15.5)
Lymphs Abs: 3653 cells/uL (ref 850–3900)
MCH: 31.1 pg (ref 27.0–33.0)
MCHC: 33.7 g/dL (ref 32.0–36.0)
MCV: 92.2 fL (ref 80.0–100.0)
MPV: 11.1 fL (ref 7.5–12.5)
Monocytes Relative: 6.6 %
Neutro Abs: 3629 cells/uL (ref 1500–7800)
Neutrophils Relative %: 44.8 %
Platelets: 429 10*3/uL — ABNORMAL HIGH (ref 140–400)
RBC: 3.96 10*6/uL (ref 3.80–5.10)
RDW: 13.2 % (ref 11.0–15.0)
Total Lymphocyte: 45.1 %
WBC mixed population: 535 cells/uL (ref 200–950)
WBC: 8.1 10*3/uL (ref 3.8–10.8)

## 2018-04-19 NOTE — Telephone Encounter (Signed)
-----   Message from Garner Nash, DO sent at 04/19/2018  9:53 AM EDT ----- Marye Round,   I discussed the results with the patient. Will recommend PET-CT in 4-6 weeks for evaluation of LUL Solitary Lung Nodule. Please help schedule and set this up.   Patient is bringing a disc from [facility] for me to look at tomorrow that was completed up there which initially showed the lesion.   Garner Nash, DO [facility] 04/19/2018 9:52 AM

## 2018-04-19 NOTE — Telephone Encounter (Signed)
Order placed for PET scan. Nothing further needed at this time.

## 2018-04-19 NOTE — Telephone Encounter (Signed)
-----   Message from Garner Nash, DO sent at 04/19/2018  9:53 AM EDT ----- Marye Round,   I discussed the results with the patient. Will recommend PET-CT in 4-6 weeks for evaluation of LUL Solitary Lung Nodule. Please help schedule and set this up.   Patient is bringing a disc from [facility] for me to look at tomorrow that was completed up there which initially showed the lesion.   Garner Nash, DO [facility] 04/19/2018 9:52 AM

## 2018-04-20 ENCOUNTER — Telehealth: Payer: Self-pay | Admitting: Pulmonary Disease

## 2018-04-20 DIAGNOSIS — M6281 Muscle weakness (generalized): Secondary | ICD-10-CM | POA: Diagnosis not present

## 2018-04-20 DIAGNOSIS — M545 Low back pain: Secondary | ICD-10-CM | POA: Diagnosis not present

## 2018-04-20 DIAGNOSIS — M25511 Pain in right shoulder: Secondary | ICD-10-CM | POA: Diagnosis not present

## 2018-04-20 DIAGNOSIS — G894 Chronic pain syndrome: Secondary | ICD-10-CM | POA: Diagnosis not present

## 2018-04-20 DIAGNOSIS — M25562 Pain in left knee: Secondary | ICD-10-CM | POA: Diagnosis not present

## 2018-04-20 DIAGNOSIS — M542 Cervicalgia: Secondary | ICD-10-CM | POA: Diagnosis not present

## 2018-04-20 NOTE — Telephone Encounter (Signed)
Rescheduled to 10/14 at 9:00 am.  Provided appt info to patient.///dhp

## 2018-04-20 NOTE — Telephone Encounter (Signed)
Left message informing patient that we received her MRI Disc and Do Icard has reveiwed it. He states "Let her know that I have reviewed the images. There was a small area seen in the right lower lobe of the lung on the MRI. The area that we are currently following is within the left upper lobe. We will see how this looks on the follow up PET scan which has been ordered. Nothing further needed at this time.

## 2018-04-20 NOTE — Telephone Encounter (Signed)
PCCM: Let her know that I have reviewed the images. There was a small area seen in the right lower lobe of the lung on the MRI. The area that we are currently following is within the left upper lobe. We will see how this looks on the follow up PET scan which has been ordered.  Thanks BLI   Garner Nash, DO [facility] 04/20/2018 5:11 PM  Personal pager: [phone] If unanswered, please page CCM On-call: [phone]

## 2018-04-20 NOTE — Telephone Encounter (Signed)
Disc placed in Dr Fabio Bering lookat cubby  Will forward to him to notify him of this

## 2018-04-20 NOTE — Telephone Encounter (Signed)
Sched Instruct:  Please schedule in 4-6 weeks   Spoke with patient. She stated that she received a call yesterday stating that her PET scan had been scheduled for 9/16. She was under the impression that the PET would not be scheduled until after Dr. Valeta Harms had reviewed the images she had done at another facility (which she stated she will bring these by the office later today) and after a 4-6 waiting period.   Advised patient that that was specified on the order and apologized to her. Advised her that I would someone to call her shortly to get this rescheduled appropriately.   PCCs, please reschedule her PET scan for mid October. Thanks!

## 2018-04-23 ENCOUNTER — Ambulatory Visit (HOSPITAL_COMMUNITY): Payer: Medicare Other

## 2018-04-25 ENCOUNTER — Telehealth: Payer: Self-pay | Admitting: Pulmonary Disease

## 2018-04-25 NOTE — Telephone Encounter (Signed)
Notes recorded by Garner Nash, DO on 04/20/2018 at 5:19 PM EDT Cassidy Olsen, you can let her know that her lab work looked ok Thanks BLI   Spoke with pt and notified of results per Icard. Pt verbalized understanding and denied any questions.

## 2018-04-26 DIAGNOSIS — F4325 Adjustment disorder with mixed disturbance of emotions and conduct: Secondary | ICD-10-CM | POA: Diagnosis not present

## 2018-04-27 DIAGNOSIS — M6281 Muscle weakness (generalized): Secondary | ICD-10-CM | POA: Diagnosis not present

## 2018-04-27 DIAGNOSIS — M545 Low back pain: Secondary | ICD-10-CM | POA: Diagnosis not present

## 2018-04-27 DIAGNOSIS — M25562 Pain in left knee: Secondary | ICD-10-CM | POA: Diagnosis not present

## 2018-04-27 DIAGNOSIS — G894 Chronic pain syndrome: Secondary | ICD-10-CM | POA: Diagnosis not present

## 2018-04-27 DIAGNOSIS — M25511 Pain in right shoulder: Secondary | ICD-10-CM | POA: Diagnosis not present

## 2018-04-27 DIAGNOSIS — M542 Cervicalgia: Secondary | ICD-10-CM | POA: Diagnosis not present

## 2018-05-02 DIAGNOSIS — G894 Chronic pain syndrome: Secondary | ICD-10-CM | POA: Diagnosis not present

## 2018-05-02 DIAGNOSIS — M25562 Pain in left knee: Secondary | ICD-10-CM | POA: Diagnosis not present

## 2018-05-02 DIAGNOSIS — M25511 Pain in right shoulder: Secondary | ICD-10-CM | POA: Diagnosis not present

## 2018-05-02 DIAGNOSIS — M542 Cervicalgia: Secondary | ICD-10-CM | POA: Diagnosis not present

## 2018-05-02 DIAGNOSIS — M545 Low back pain: Secondary | ICD-10-CM | POA: Diagnosis not present

## 2018-05-02 DIAGNOSIS — M6281 Muscle weakness (generalized): Secondary | ICD-10-CM | POA: Diagnosis not present

## 2018-05-03 DIAGNOSIS — G894 Chronic pain syndrome: Secondary | ICD-10-CM | POA: Diagnosis not present

## 2018-05-03 DIAGNOSIS — M25562 Pain in left knee: Secondary | ICD-10-CM | POA: Diagnosis not present

## 2018-05-03 DIAGNOSIS — M25511 Pain in right shoulder: Secondary | ICD-10-CM | POA: Diagnosis not present

## 2018-05-03 DIAGNOSIS — M545 Low back pain: Secondary | ICD-10-CM | POA: Diagnosis not present

## 2018-05-03 DIAGNOSIS — M6281 Muscle weakness (generalized): Secondary | ICD-10-CM | POA: Diagnosis not present

## 2018-05-03 DIAGNOSIS — M542 Cervicalgia: Secondary | ICD-10-CM | POA: Diagnosis not present

## 2018-05-04 DIAGNOSIS — M47816 Spondylosis without myelopathy or radiculopathy, lumbar region: Secondary | ICD-10-CM | POA: Diagnosis not present

## 2018-05-04 DIAGNOSIS — Z79899 Other long term (current) drug therapy: Secondary | ICD-10-CM | POA: Diagnosis not present

## 2018-05-04 DIAGNOSIS — Z79891 Long term (current) use of opiate analgesic: Secondary | ICD-10-CM | POA: Diagnosis not present

## 2018-05-04 DIAGNOSIS — M25519 Pain in unspecified shoulder: Secondary | ICD-10-CM | POA: Diagnosis not present

## 2018-05-04 DIAGNOSIS — G894 Chronic pain syndrome: Secondary | ICD-10-CM | POA: Diagnosis not present

## 2018-05-04 DIAGNOSIS — M5481 Occipital neuralgia: Secondary | ICD-10-CM | POA: Diagnosis not present

## 2018-05-08 ENCOUNTER — Telehealth: Payer: Self-pay | Admitting: Neurology

## 2018-05-08 NOTE — Telephone Encounter (Signed)
Pt has been doing very well since injections and dry needling with occipital neuralgia. About a week ago she started having spasms again. She is wanting to know if she could get an appt soon to come in for injections. She said pain scale is a 5. Please call to advise asap.

## 2018-05-08 NOTE — Telephone Encounter (Signed)
Pt called today, she did not want to have ablation and forgot to return Dana's call.  FYI

## 2018-05-09 DIAGNOSIS — Z23 Encounter for immunization: Secondary | ICD-10-CM | POA: Diagnosis not present

## 2018-05-09 DIAGNOSIS — M545 Low back pain: Secondary | ICD-10-CM | POA: Diagnosis not present

## 2018-05-09 DIAGNOSIS — M6281 Muscle weakness (generalized): Secondary | ICD-10-CM | POA: Diagnosis not present

## 2018-05-09 DIAGNOSIS — M25511 Pain in right shoulder: Secondary | ICD-10-CM | POA: Diagnosis not present

## 2018-05-09 DIAGNOSIS — M25562 Pain in left knee: Secondary | ICD-10-CM | POA: Diagnosis not present

## 2018-05-09 DIAGNOSIS — G894 Chronic pain syndrome: Secondary | ICD-10-CM | POA: Diagnosis not present

## 2018-05-09 DIAGNOSIS — M542 Cervicalgia: Secondary | ICD-10-CM | POA: Diagnosis not present

## 2018-05-09 NOTE — Telephone Encounter (Signed)
Spoke with patient. Advised her that Dr. Jaynee Eagles said to wait and then call us back. Pt will see how the needling affects her and then call back tomorrow. Pt verbalized appreciation.

## 2018-05-09 NOTE — Telephone Encounter (Signed)
Spoke with patient. She said that someone ran into her with a cart yesterday in the store and it got her neck "out of proportion". She had dry needling done today. She said her headache is better, about a 4/5. She wanted to know if Dr. Jaynee Eagles still wanted her to come for the injections or if she needed to wait. RN advised she will call pt back after d/w Dr. Jaynee Eagles. Pt appreciative.

## 2018-05-09 NOTE — Telephone Encounter (Signed)
That's fine, give her an appointment. Not sure if I have anything tomorrow night maybe 4pm since we don't have an emg as we planned? thanks

## 2018-05-09 NOTE — Telephone Encounter (Signed)
Wait until after dry needling tomorrow and call us back thanks

## 2018-05-11 DIAGNOSIS — M545 Low back pain: Secondary | ICD-10-CM | POA: Diagnosis not present

## 2018-05-11 DIAGNOSIS — M25511 Pain in right shoulder: Secondary | ICD-10-CM | POA: Diagnosis not present

## 2018-05-11 DIAGNOSIS — G894 Chronic pain syndrome: Secondary | ICD-10-CM | POA: Diagnosis not present

## 2018-05-11 DIAGNOSIS — M542 Cervicalgia: Secondary | ICD-10-CM | POA: Diagnosis not present

## 2018-05-11 DIAGNOSIS — M25562 Pain in left knee: Secondary | ICD-10-CM | POA: Diagnosis not present

## 2018-05-11 DIAGNOSIS — M6281 Muscle weakness (generalized): Secondary | ICD-10-CM | POA: Diagnosis not present

## 2018-05-14 NOTE — Telephone Encounter (Signed)
I have called patient back  Two  Times and left her a message asking her to call me back so we can get this resolved I asked her to please call me back this  Week so I can help her.

## 2018-05-15 DIAGNOSIS — M542 Cervicalgia: Secondary | ICD-10-CM | POA: Diagnosis not present

## 2018-05-15 DIAGNOSIS — M6281 Muscle weakness (generalized): Secondary | ICD-10-CM | POA: Diagnosis not present

## 2018-05-15 DIAGNOSIS — G894 Chronic pain syndrome: Secondary | ICD-10-CM | POA: Diagnosis not present

## 2018-05-15 DIAGNOSIS — M545 Low back pain: Secondary | ICD-10-CM | POA: Diagnosis not present

## 2018-05-15 DIAGNOSIS — M25562 Pain in left knee: Secondary | ICD-10-CM | POA: Diagnosis not present

## 2018-05-15 DIAGNOSIS — M25511 Pain in right shoulder: Secondary | ICD-10-CM | POA: Diagnosis not present

## 2018-05-21 ENCOUNTER — Telehealth: Payer: Self-pay | Admitting: Pulmonary Disease

## 2018-05-21 ENCOUNTER — Ambulatory Visit (HOSPITAL_COMMUNITY): Payer: Medicare Other

## 2018-05-21 ENCOUNTER — Ambulatory Visit (HOSPITAL_COMMUNITY)
Admission: RE | Admit: 2018-05-21 | Discharge: 2018-05-21 | Disposition: A | Discharge status: Home / Self Care | Payer: Medicare Other | Source: Ambulatory Visit | Attending: Pulmonary Disease | Admitting: Pulmonary Disease

## 2018-05-21 DIAGNOSIS — N9489 Other specified conditions associated with female genital organs and menstrual cycle: Secondary | ICD-10-CM | POA: Insufficient documentation

## 2018-05-21 DIAGNOSIS — R918 Other nonspecific abnormal finding of lung field: Secondary | ICD-10-CM

## 2018-05-21 DIAGNOSIS — R911 Solitary pulmonary nodule: Secondary | ICD-10-CM | POA: Insufficient documentation

## 2018-05-21 DIAGNOSIS — N201 Calculus of ureter: Secondary | ICD-10-CM | POA: Diagnosis not present

## 2018-05-21 LAB — GLUCOSE, CAPILLARY: Glucose-Capillary: 93 mg/dL (ref 70–99)

## 2018-05-21 MED ORDER — FLUDEOXYGLUCOSE F - 18 (FDG) INJECTION
7.8500 | Freq: Once | INTRAVENOUS | Status: AC | PRN
  Administered 2018-05-21: 7.85 via INTRAVENOUS

## 2018-05-21 NOTE — Telephone Encounter (Signed)
Received a call report from [facility] [facility] on pt's PET scan. The results are shown below.  IMPRESSION: 1. Near complete interval resolution of band like opacity in the superior segment of the left lower lobe, most consistent with a resolving inflammatory process. This demonstrates only low level hypermetabolic activity. Chest radiographic follow-up in approximately 4 weeks recommended to document complete resolution. 2. No evidence of thoracic malignancy. 3. Obstructing 4 mm calculus at the right ureteropelvic junction. Additional bilateral renal calculi. 4. Stable cystic left adnexal lesion without hypermetabolic activity. 5. These results will be called to the ordering clinician or representative by the Radiologist Assistant, and communication documented in the PACS or zVision Dashboard  Due to Dr. Valeta Harms not being in office and not showing up on [place], sending to Lazaro Arms, NP

## 2018-05-21 NOTE — Telephone Encounter (Signed)
Attempted to call pt but unable to reach her.   Left message for pt to return call x1  Will route this message back to triage pool

## 2018-05-21 NOTE — Telephone Encounter (Signed)
Please call to let patient know that from a pulmonary standpoint the area of concern does look improved. Dr. Valeta Harms will go over this in better detail at upcoming visit.   There were some incidental findings on the scan. The scan showed an obstructing right sided kidney stone and a left ovarian cyst. She needs to follow up with her PCP immediately to have these issues addressed.

## 2018-05-23 NOTE — Telephone Encounter (Signed)
Patient returned call and asked to be called back this afternoon, CB [phone]

## 2018-05-23 NOTE — Progress Notes (Signed)
LMTCB

## 2018-05-23 NOTE — Telephone Encounter (Signed)
Attempted to call pt but unable to reach her. Left message for pt to return call. 

## 2018-05-24 NOTE — Telephone Encounter (Signed)
Pt returned call. I gave her the results of the PET scan and stated to her to call PCP in regards to the results.  Pt expressed understanding. I have routed the results of the PET to pcp for pt.  Nothing further needed.

## 2018-05-28 ENCOUNTER — Telehealth: Payer: Self-pay | Admitting: Pulmonary Disease

## 2018-05-28 DIAGNOSIS — N201 Calculus of ureter: Secondary | ICD-10-CM | POA: Diagnosis not present

## 2018-05-28 DIAGNOSIS — R9389 Abnormal findings on diagnostic imaging of other specified body structures: Secondary | ICD-10-CM | POA: Diagnosis not present

## 2018-05-28 DIAGNOSIS — G894 Chronic pain syndrome: Secondary | ICD-10-CM | POA: Diagnosis not present

## 2018-05-28 DIAGNOSIS — J42 Unspecified chronic bronchitis: Secondary | ICD-10-CM | POA: Diagnosis not present

## 2018-05-28 DIAGNOSIS — R350 Frequency of micturition: Secondary | ICD-10-CM | POA: Diagnosis not present

## 2018-05-28 NOTE — Telephone Encounter (Signed)
Pt's PET was faxed to pcp for pt.  Attempted to call pt but line went straight to VM.  Left a detailed message on pt's machine stating to her that the results were faxed over to PCP for her.  Nothing further needed.

## 2018-05-29 ENCOUNTER — Other Ambulatory Visit: Payer: Self-pay

## 2018-05-29 DIAGNOSIS — M545 Low back pain: Secondary | ICD-10-CM | POA: Diagnosis not present

## 2018-05-29 DIAGNOSIS — G894 Chronic pain syndrome: Secondary | ICD-10-CM | POA: Diagnosis not present

## 2018-05-29 DIAGNOSIS — M6281 Muscle weakness (generalized): Secondary | ICD-10-CM | POA: Diagnosis not present

## 2018-05-29 DIAGNOSIS — M542 Cervicalgia: Secondary | ICD-10-CM | POA: Diagnosis not present

## 2018-05-29 DIAGNOSIS — M25562 Pain in left knee: Secondary | ICD-10-CM | POA: Diagnosis not present

## 2018-05-29 DIAGNOSIS — M25511 Pain in right shoulder: Secondary | ICD-10-CM | POA: Diagnosis not present

## 2018-05-29 MED ORDER — BUDESONIDE-FORMOTEROL FUMARATE 80-4.5 MCG/ACT IN AERO: 2.0000 | INHALATION_SPRAY | Freq: Two times a day (BID) | RESPIRATORY_TRACT | 5 refills | Status: DC

## 2018-05-29 NOTE — Progress Notes (Signed)
Synopsis: Referred in September 2019 for asthma by Leeroy Cha,*  Subjective:   PATIENT ID: Cassidy Olsen Agee GENDER: female DOB: 17-Aug-1960, MRN: [id number]  Chief Complaint  Patient presents with  . Follow-up    F/u after Pet Scan. No new concerns.    Seen initially for SOB. First week of august she was sick, cough, fevers, chills. She was treated with azithromycin. She has pain her left chest that she describes as pleuritic. The pain lasted for about 3 weeks. 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 The MRI revealed an abnormality in the chest.   She started to feel some better. She still has a dry cough. Her SOB is better and no issue with climbing stairs. Of note, she has had several bouts of bronchitis. Routinely, usually 1-2 times per year with bronchitis. She was told that she may have chronic bronchitis. Patient does have seasonal allergies.  She does use Flonase for allergic rhinitis.  This is usually worse in the fall and early spring time.  She denies aspirin and sensitivity.  She does have a history of asthma.  She had episodic wheezing diagnosed in her mid 56s.  She stated she was much heavier at this time.  And once losing significant amount awake her respiratory symptoms improved.  She does not have any pets, birds, dogs or cats in the home.  She is retired and works Retail buyer.She was started on Symbicort as well as as needed albuterol by her primary care provider which states has improved her symptoms.  She has not been terribly compliant with the use of Symbicort.  OV 05/30/2018: She feels like she is breathing better. She has been using symbicort regularly as well as the singulair. She does feel like this is her worst time of year. She does feel as if the medications are maybe helping. Since our last visit she had a PET scan completed for evaluation of the lung nodule found on original CT imaging.  The PET scan showed near complete  resolution of this area of the lung.  She does have a small area of groundglass remaining.  As for her bronchial symptoms she still has occasional chest tightness.  But overall has significantly improved on Symbicort.  Patient denies chest pain, fevers, sputum production today.   Past Medical History:  Diagnosis Date  . Chronic pain   . Family history of breast cancer   . Family history of prostate cancer   . GERD (gastroesophageal reflux disease)   . High cholesterol   . IBS (irritable bowel syndrome)   . Migraine   . Occipital neuralgia   . Osteoarthritis   . Spinal stenosis   . Trigeminal neuralgia      Family History  Problem Relation Age of Onset  . Ulcers Mother 25       peptic ulcer  . Prostate cancer Father 64  . Breast cancer Sister 19       ER+/PR+/Her2- breast cancer  . Breast cancer Paternal Grandmother   . Diabetes Paternal Grandmother   . Cancer Paternal Uncle        unknown cancers     Social History   Socioeconomic History  . Marital status: Single    Spouse name: Not on file  . Number of children: 1  . Years of education: Not on file  . Highest education level: Not on file  Occupational History  . Not on file  Social Needs  .  Financial resource strain: Not on file  . Food insecurity:    Worry: Not on file    Inability: Not on file  . Transportation needs:    Medical: Not on file    Non-medical: Not on file  Tobacco Use  . Smoking status: Never Smoker  . Smokeless tobacco: Never Used  Substance and Sexual Activity  . Alcohol use: Yes    Comment: socially  . Drug use: No  . Sexual activity: Not on file  Lifestyle  . Physical activity:    Days per week: Not on file    Minutes per session: Not on file  . Stress: Not on file  Relationships  . Social connections:    Talks on phone: Not on file    Gets together: Not on file    Attends religious service: Not on file    Active member of club or organization: Not on file    Attends meetings  of clubs or organizations: Not on file    Relationship status: Not on file  . Intimate partner violence:    Fear of current or ex partner: Not on file    Emotionally abused: Not on file    Physically abused: Not on file    Forced sexual activity: Not on file  Other Topics Concern  . Not on file  Social History Narrative  . Not on file     Allergies  Allergen Reactions  . Aspirin   . Ibuprofen   . Latex   . Penicillins      Outpatient Medications Prior to Visit  Medication Sig Dispense Refill  . amitriptyline (ELAVIL) 50 MG tablet Take 50 mg by mouth at bedtime.    . baclofen (LIORESAL) 10 MG tablet Take 10 mg by mouth daily.     . budesonide-formoterol (SYMBICORT) 80-4.5 MCG/ACT inhaler Inhale 2 puffs into the lungs every 12 (twelve) hours. 1 Inhaler 5  . celecoxib (CELEBREX) 200 MG capsule Take 200 mg by mouth daily.     Marland Kitchen dexlansoprazole (DEXILANT) 60 MG capsule Take 60 mg by mouth daily.    . diphenhydrAMINE (BENADRYL ALLERGY) 25 MG tablet Take 2 tablets (50 mg total) by mouth every 8 (eight) hours as needed. 10 tablet 0  . fenofibrate (TRICOR) 145 MG tablet Take by mouth.    . gabapentin (NEURONTIN) 300 MG capsule Take 300 mg by mouth 3 (three) times daily.    Marland Kitchen lubiprostone (AMITIZA) 24 MCG capsule Take 24 mcg by mouth 2 (two) times daily with a meal.    . Melatonin 10 MG TABS Take by mouth.    . montelukast (SINGULAIR) 10 MG tablet Take 1 tablet (10 mg total) by mouth at bedtime. 30 tablet 11  . Spacer/Aero-Holding Chambers (AEROCHAMBER MV) inhaler Use as instructed 1 each 0  . tapentadol (NUCYNTA ER) 100 MG 12 hr tablet Take 100 mg by mouth every 12 (twelve) hours.    . topiramate (TOPAMAX) 100 MG tablet Take 100 mg by mouth 2 (two) times daily.     No facility-administered medications prior to visit.     Review of Systems  Constitutional: Negative for chills, fever, malaise/fatigue and weight loss.  HENT: Negative for hearing loss, sore throat and tinnitus.   Eyes:  Negative for blurred vision and double vision.  Respiratory: Negative for cough, hemoptysis, sputum production, shortness of breath and stridor.        Occasional chest tightness  Cardiovascular: Negative for chest pain, palpitations, orthopnea, leg swelling and PND.  Gastrointestinal: Negative for abdominal pain, constipation, diarrhea, heartburn, nausea and vomiting.  Genitourinary: Negative for dysuria, hematuria and urgency.  Musculoskeletal: Negative for joint pain and myalgias.  Skin: Negative for itching and rash.  Neurological: Negative for dizziness, tingling, weakness and headaches.  Endo/Heme/Allergies: Negative for environmental allergies. Does not bruise/bleed easily.  Psychiatric/Behavioral: Negative for depression. The patient is not nervous/anxious and does not have insomnia.   All other systems reviewed and are negative.    Objective:  Physical Exam  Constitutional: She is oriented to person, place, and time. She appears well-developed and well-nourished. No distress.  HENT:  Head: Normocephalic and atraumatic.  Mouth/Throat: Oropharynx is clear and moist.  Eyes: Pupils are equal, round, and reactive to light. Conjunctivae are normal. No scleral icterus.  Neck: Neck supple. No JVD present. No tracheal deviation present.  Cardiovascular: Normal rate, regular rhythm, normal heart sounds and intact distal pulses.  No murmur heard. Pulmonary/Chest: Effort normal and breath sounds normal. No accessory muscle usage or stridor. No tachypnea. No respiratory distress. She has no wheezes. She has no rhonchi. She has no rales.  Abdominal: Soft. Bowel sounds are normal. She exhibits no distension. There is no tenderness.  Musculoskeletal: She exhibits no edema or tenderness.  Lymphadenopathy:    She has no cervical adenopathy.  Neurological: She is alert and oriented to person, place, and time.  Skin: Skin is warm and dry. Capillary refill takes less than 2 seconds. No rash noted.   Psychiatric: She has a normal mood and affect. Her behavior is normal.  Vitals reviewed.    Vitals:   05/30/18 0948  BP: 118/80  Pulse: 83  SpO2: 100%  Weight: 157 lb 3.2 oz (71.3 kg)  Height: 5' 2.5" (1.588 m)   100% on RA BMI Readings from Last 3 Encounters:  05/30/18 28.29 kg/m  04/18/18 27.65 kg/m  02/01/18 27.99 kg/m   Wt Readings from Last 3 Encounters:  05/30/18 157 lb 3.2 oz (71.3 kg)  04/18/18 153 lb 9.6 oz (69.7 kg)  02/01/18 158 lb (71.7 kg)   CBC    Component Value Date/Time   WBC 8.1 04/18/2018 1412   RBC 3.96 04/18/2018 1412   HGB 12.3 04/18/2018 1412   HCT 36.5 04/18/2018 1412   PLT 429 (H) 04/18/2018 1412   MCV 92.2 04/18/2018 1412   MCH 31.1 04/18/2018 1412   MCHC 33.7 04/18/2018 1412   RDW 13.2 04/18/2018 1412   LYMPHSABS 3,653 04/18/2018 1412   MONOABS 0.6 11/18/2016 2303   EOSABS 211 04/18/2018 1412   BASOSABS 73 04/18/2018 1412   Regional Allergy Panel 04/18/2018 - Negative  IgE: 47  Chest Imaging:  CXR 04/17/2018: Left sided nodular density   Nuclear medicine PET scan: 05/21/2018: SUV max 2.2 left lower lobe bandlike opacity seen on prior CT imaging.  Much improved the last nodular masslike appearance. 4 mm right ureteropelvic junction calculus, cystic left adnexal mass no metabolic activity.  Pulmonary Functions Testing Results: No flowsheet data found.  FeNO: None  Pathology: None  Echocardiogram: None  Heart Catheterization: None    Assessment & Plan:   Abnormal findings on diagnostic imaging of lung  Lung nodule  Seasonal allergies  Seasonal allergic rhinitis, unspecified trigger  Discussion:  57 year old female with allergic rhinitis and prior history of asthma diagnosis in her 33s.  Recent allergy panel negative, IgE 47.  Had had a history of community-acquired pneumonia treated with azithromycin.  Left lower lobe lung nodule now resolved with small area of groundglass on  recent PET imaging, low SUV uptake 2.2  max  Continue Symbicort 2 puffs twice daily with spacer Continue albuterol as needed for shortness of breath chest tightness and wheezing Continue Singulair 10 mg daily Continue Flonase Repeat chest x-ray in 6 weeks to view for evaluation of complete resolution of left lung opacity Return to clinic in 6 months or if symptoms worsen We will consider repeat PFTs before this visit if she has still having ongoing respiratory symptoms.    Current Outpatient Medications:  .  amitriptyline (ELAVIL) 50 MG tablet, Take 50 mg by mouth at bedtime., Disp: , Rfl:  .  baclofen (LIORESAL) 10 MG tablet, Take 10 mg by mouth daily. , Disp: , Rfl:  .  budesonide-formoterol (SYMBICORT) 80-4.5 MCG/ACT inhaler, Inhale 2 puffs into the lungs every 12 (twelve) hours., Disp: 1 Inhaler, Rfl: 5 .  celecoxib (CELEBREX) 200 MG capsule, Take 200 mg by mouth daily. , Disp: , Rfl:  .  dexlansoprazole (DEXILANT) 60 MG capsule, Take 60 mg by mouth daily., Disp: , Rfl:  .  diphenhydrAMINE (BENADRYL ALLERGY) 25 MG tablet, Take 2 tablets (50 mg total) by mouth every 8 (eight) hours as needed., Disp: 10 tablet, Rfl: 0 .  fenofibrate (TRICOR) 145 MG tablet, Take by mouth., Disp: , Rfl:  .  gabapentin (NEURONTIN) 300 MG capsule, Take 300 mg by mouth 3 (three) times daily., Disp: , Rfl:  .  lubiprostone (AMITIZA) 24 MCG capsule, Take 24 mcg by mouth 2 (two) times daily with a meal., Disp: , Rfl:  .  Melatonin 10 MG TABS, Take by mouth., Disp: , Rfl:  .  montelukast (SINGULAIR) 10 MG tablet, Take 1 tablet (10 mg total) by mouth at bedtime., Disp: 30 tablet, Rfl: 11 .  Spacer/Aero-Holding Chambers (AEROCHAMBER MV) inhaler, Use as instructed, Disp: 1 each, Rfl: 0 .  tapentadol (NUCYNTA ER) 100 MG 12 hr tablet, Take 100 mg by mouth every 12 (twelve) hours., Disp: , Rfl:  .  topiramate (TOPAMAX) 100 MG tablet, Take 100 mg by mouth 2 (two) times daily., Disp: , Rfl:    Garner Nash, DO [facility] 05/30/2018  10:09 AM

## 2018-05-29 NOTE — Patient Instructions (Addendum)
Thank you for visiting Dr. Valeta Harms at [facility]. Today we recommend the following: No orders of the defined types were placed in this encounter.  Meds ordered this encounter  Medications  . budesonide-formoterol (SYMBICORT) 80-4.5 MCG/ACT inhaler    Sig: Inhale 2 puffs into the lungs every 12 (twelve) hours.    Dispense:  1 Inhaler    Refill:  5   Return in about 6 months (around 11/29/2018), or if symptoms worsen or fail to improve.

## 2018-05-30 ENCOUNTER — Ambulatory Visit (INDEPENDENT_AMBULATORY_CARE_PROVIDER_SITE_OTHER): Payer: Medicare Other | Admitting: Pulmonary Disease

## 2018-05-30 ENCOUNTER — Encounter: Payer: Self-pay | Admitting: Pulmonary Disease

## 2018-05-30 VITALS — BP 118/80 | HR 83 | Ht 62.5 in | Wt 157.2 lb

## 2018-05-30 DIAGNOSIS — R911 Solitary pulmonary nodule: Secondary | ICD-10-CM | POA: Diagnosis not present

## 2018-05-30 DIAGNOSIS — R918 Other nonspecific abnormal finding of lung field: Secondary | ICD-10-CM | POA: Diagnosis not present

## 2018-05-30 DIAGNOSIS — J302 Other seasonal allergic rhinitis: Secondary | ICD-10-CM

## 2018-05-30 MED ORDER — BUDESONIDE-FORMOTEROL FUMARATE 80-4.5 MCG/ACT IN AERO: 2.0000 | INHALATION_SPRAY | Freq: Two times a day (BID) | RESPIRATORY_TRACT | 5 refills | Status: DC

## 2018-05-31 DIAGNOSIS — G894 Chronic pain syndrome: Secondary | ICD-10-CM | POA: Diagnosis not present

## 2018-05-31 DIAGNOSIS — M542 Cervicalgia: Secondary | ICD-10-CM | POA: Diagnosis not present

## 2018-05-31 DIAGNOSIS — M25562 Pain in left knee: Secondary | ICD-10-CM | POA: Diagnosis not present

## 2018-05-31 DIAGNOSIS — M25511 Pain in right shoulder: Secondary | ICD-10-CM | POA: Diagnosis not present

## 2018-05-31 DIAGNOSIS — M545 Low back pain: Secondary | ICD-10-CM | POA: Diagnosis not present

## 2018-05-31 DIAGNOSIS — M6281 Muscle weakness (generalized): Secondary | ICD-10-CM | POA: Diagnosis not present

## 2018-06-01 DIAGNOSIS — E785 Hyperlipidemia, unspecified: Secondary | ICD-10-CM | POA: Diagnosis not present

## 2018-06-01 DIAGNOSIS — R1030 Lower abdominal pain, unspecified: Secondary | ICD-10-CM | POA: Diagnosis not present

## 2018-06-01 DIAGNOSIS — K581 Irritable bowel syndrome with constipation: Secondary | ICD-10-CM | POA: Diagnosis not present

## 2018-06-01 DIAGNOSIS — Z0000 Encounter for general adult medical examination without abnormal findings: Secondary | ICD-10-CM | POA: Diagnosis not present

## 2018-06-01 DIAGNOSIS — Z1389 Encounter for screening for other disorder: Secondary | ICD-10-CM | POA: Diagnosis not present

## 2018-06-01 DIAGNOSIS — D122 Benign neoplasm of ascending colon: Secondary | ICD-10-CM | POA: Diagnosis not present

## 2018-06-01 DIAGNOSIS — R928 Other abnormal and inconclusive findings on diagnostic imaging of breast: Secondary | ICD-10-CM | POA: Diagnosis not present

## 2018-06-01 DIAGNOSIS — Z90710 Acquired absence of both cervix and uterus: Secondary | ICD-10-CM | POA: Diagnosis not present

## 2018-06-01 DIAGNOSIS — J449 Chronic obstructive pulmonary disease, unspecified: Secondary | ICD-10-CM | POA: Diagnosis not present

## 2018-06-01 DIAGNOSIS — K210 Gastro-esophageal reflux disease with esophagitis: Secondary | ICD-10-CM | POA: Diagnosis not present

## 2018-06-04 DIAGNOSIS — F4325 Adjustment disorder with mixed disturbance of emotions and conduct: Secondary | ICD-10-CM | POA: Diagnosis not present

## 2018-06-05 DIAGNOSIS — M25511 Pain in right shoulder: Secondary | ICD-10-CM | POA: Diagnosis not present

## 2018-06-05 DIAGNOSIS — G894 Chronic pain syndrome: Secondary | ICD-10-CM | POA: Diagnosis not present

## 2018-06-05 DIAGNOSIS — M7501 Adhesive capsulitis of right shoulder: Secondary | ICD-10-CM | POA: Diagnosis not present

## 2018-06-05 DIAGNOSIS — M6281 Muscle weakness (generalized): Secondary | ICD-10-CM | POA: Diagnosis not present

## 2018-06-05 DIAGNOSIS — M542 Cervicalgia: Secondary | ICD-10-CM | POA: Diagnosis not present

## 2018-06-05 DIAGNOSIS — M545 Low back pain: Secondary | ICD-10-CM | POA: Diagnosis not present

## 2018-06-05 DIAGNOSIS — M25562 Pain in left knee: Secondary | ICD-10-CM | POA: Diagnosis not present

## 2018-06-07 DIAGNOSIS — G894 Chronic pain syndrome: Secondary | ICD-10-CM | POA: Diagnosis not present

## 2018-06-07 DIAGNOSIS — M5481 Occipital neuralgia: Secondary | ICD-10-CM | POA: Diagnosis not present

## 2018-06-07 DIAGNOSIS — M25519 Pain in unspecified shoulder: Secondary | ICD-10-CM | POA: Diagnosis not present

## 2018-06-07 DIAGNOSIS — M47816 Spondylosis without myelopathy or radiculopathy, lumbar region: Secondary | ICD-10-CM | POA: Diagnosis not present

## 2018-06-08 DIAGNOSIS — M542 Cervicalgia: Secondary | ICD-10-CM | POA: Diagnosis not present

## 2018-06-08 DIAGNOSIS — M25511 Pain in right shoulder: Secondary | ICD-10-CM | POA: Diagnosis not present

## 2018-06-08 DIAGNOSIS — M545 Low back pain: Secondary | ICD-10-CM | POA: Diagnosis not present

## 2018-06-08 DIAGNOSIS — M25562 Pain in left knee: Secondary | ICD-10-CM | POA: Diagnosis not present

## 2018-06-08 DIAGNOSIS — M6281 Muscle weakness (generalized): Secondary | ICD-10-CM | POA: Diagnosis not present

## 2018-06-08 DIAGNOSIS — R300 Dysuria: Secondary | ICD-10-CM | POA: Diagnosis not present

## 2018-06-08 DIAGNOSIS — N3010 Interstitial cystitis (chronic) without hematuria: Secondary | ICD-10-CM | POA: Diagnosis not present

## 2018-06-08 DIAGNOSIS — G894 Chronic pain syndrome: Secondary | ICD-10-CM | POA: Diagnosis not present

## 2018-06-08 DIAGNOSIS — N898 Other specified noninflammatory disorders of vagina: Secondary | ICD-10-CM | POA: Diagnosis not present

## 2018-06-12 NOTE — Telephone Encounter (Signed)
Patient states she is only having mild spasms and  occipital  nerve pain and wants to know if she should come in for an injection and if so how long would it take for an appointment?

## 2018-06-12 NOTE — Telephone Encounter (Signed)
It is really up to her, if she wants injections she can come in. I can squeeze her in between patients this week or next week whatever she likes. thanks

## 2018-06-13 NOTE — Telephone Encounter (Signed)
Returned pt's call and asked for a call back. Informed her that Dr. Jaynee Eagles said it's really up to her. If she wants injections she can come in. We can see her. We do have an opening today but unsure if it will remain open. Asked for a call back as soon as she can. Left office number in message.

## 2018-06-13 NOTE — Telephone Encounter (Signed)
Pt returned my call. She states she feels better. It is not more than she can bear. She would like to hold off for now on the injections. She will try to stay quiet and take it easy. Pt aware that if she needs to she can always call back and we can try to get her in for the injections. She verbalized appreciation and had no further concerns.

## 2018-06-14 DIAGNOSIS — M545 Low back pain: Secondary | ICD-10-CM | POA: Diagnosis not present

## 2018-06-14 DIAGNOSIS — M25511 Pain in right shoulder: Secondary | ICD-10-CM | POA: Diagnosis not present

## 2018-06-14 DIAGNOSIS — M542 Cervicalgia: Secondary | ICD-10-CM | POA: Diagnosis not present

## 2018-06-14 DIAGNOSIS — M6281 Muscle weakness (generalized): Secondary | ICD-10-CM | POA: Diagnosis not present

## 2018-06-14 DIAGNOSIS — G894 Chronic pain syndrome: Secondary | ICD-10-CM | POA: Diagnosis not present

## 2018-06-14 DIAGNOSIS — M25562 Pain in left knee: Secondary | ICD-10-CM | POA: Diagnosis not present

## 2018-06-15 ENCOUNTER — Other Ambulatory Visit: Payer: Self-pay

## 2018-06-15 ENCOUNTER — Encounter (HOSPITAL_COMMUNITY): Payer: Self-pay | Admitting: *Deleted

## 2018-06-15 ENCOUNTER — Inpatient Hospital Stay (HOSPITAL_COMMUNITY)
Admission: AD | Admit: 2018-06-15 | Discharge: 2018-06-15 | Disposition: A | Discharge status: Home / Self Care | Payer: Medicare Other | Source: Ambulatory Visit | Attending: Obstetrics and Gynecology | Admitting: Obstetrics and Gynecology

## 2018-06-15 DIAGNOSIS — R1031 Right lower quadrant pain: Secondary | ICD-10-CM | POA: Insufficient documentation

## 2018-06-15 DIAGNOSIS — Z880 Allergy status to penicillin: Secondary | ICD-10-CM | POA: Insufficient documentation

## 2018-06-15 DIAGNOSIS — N949 Unspecified condition associated with female genital organs and menstrual cycle: Secondary | ICD-10-CM | POA: Diagnosis not present

## 2018-06-15 DIAGNOSIS — R109 Unspecified abdominal pain: Secondary | ICD-10-CM | POA: Diagnosis present

## 2018-06-15 DIAGNOSIS — R1084 Generalized abdominal pain: Secondary | ICD-10-CM

## 2018-06-15 HISTORY — DX: Unspecified asthma, uncomplicated: J45.909

## 2018-06-15 LAB — URINALYSIS, ROUTINE W REFLEX MICROSCOPIC
Bilirubin Urine: NEGATIVE
Glucose, UA: NEGATIVE mg/dL
Hgb urine dipstick: NEGATIVE
Ketones, ur: NEGATIVE mg/dL
Leukocytes, UA: NEGATIVE
Nitrite: NEGATIVE
Protein, ur: NEGATIVE mg/dL
Specific Gravity, Urine: 1.011 (ref 1.005–1.030)
pH: 8 (ref 5.0–8.0)

## 2018-06-15 MED ORDER — HYDROMORPHONE HCL 1 MG/ML IJ SOLN
1.0000 mg | Freq: Once | INTRAMUSCULAR | Status: AC
  Administered 2018-06-15: 1 mg via INTRAMUSCULAR
  Filled 2018-06-15: qty 1

## 2018-06-15 MED ORDER — SIMETHICONE 80 MG PO CHEW
160.0000 mg | CHEWABLE_TABLET | Freq: Once | ORAL | Status: AC
  Administered 2018-06-15: 80 mg via ORAL
  Filled 2018-06-15: qty 2

## 2018-06-15 MED ORDER — SIMETHICONE 180 MG PO CAPS: 180.0000 mg | ORAL_CAPSULE | Freq: Two times a day (BID) | ORAL | 0 refills | Status: DC

## 2018-06-15 NOTE — MAU Note (Signed)
Urine in lab 

## 2018-06-15 NOTE — MAU Provider Note (Addendum)
History    Patient Cassidy Olsen  Is a 57 y.o. [id number] pnon-pregnant female here with complaints of adominal pain. Patient is a patient of Dr. Simona Huh. Patient has known IBS, and other comorbidities (see problem list). She is here because she is concerned about her abdominal pain, and she thinks that something is wrong.  CSN: [id number]  Arrival date and time: 06/15/18 1452   None     Chief Complaint  Patient presents with  . Abdominal Pain  . Back Pain   Abdominal Pain  This is a new problem. The current episode started today. The problem occurs constantly. The pain is located in the LLQ. The pain is at a severity of 10/10. The quality of the pain is cramping. The abdominal pain radiates to the RLQ.   Patient describes a history of chronic abdominal pain and IBS.  She has had an appendectomy. She has a PCP and GI doctor. She had a PCP visit last week. Patient had a pelvic US today at Dr. Andy Gauss office, which showed a 5 x 3 cyst on the left adnexa, no free fluid, non-vascular.    Patient says that she was eating lunch at 1 pm (soup and salad) and suddenly had intense abdominal pain. She came in to MAU because she is concerned that the cyst is connected to something on her intestines.   Patient's BM have been slightly smaller than normal, but she is still having daily BM.   Patient had a PET scan in October, which was negative.   OB History    Gravida  3   Para  1   Term      Preterm      AB  2   Living  1     SAB  1   TAB  1   Ectopic      Multiple      Live Births              Past Medical History:  Diagnosis Date  . Asthma   . Chronic pain   . Family history of breast cancer   . Family history of prostate cancer   . GERD (gastroesophageal reflux disease)   . High cholesterol   . IBS (irritable bowel syndrome)   . Migraine   . Occipital neuralgia   . Osteoarthritis   . Spinal stenosis   . Trigeminal neuralgia   . Trigeminal neuralgia      Past Surgical History:  Procedure Laterality Date  . ABDOMINAL HYSTERECTOMY    . ANKLE SURGERY Left 05/2009  . APPENDECTOMY    . BACK SURGERY    . 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 Pershing Proud at [facility]  . CARPAL TUNNEL RELEASE  2014  . JOINT REPLACEMENT     R knee  . OVARIAN CYST REMOVAL  05/2010  . right knee revision  2016    Family History  Problem Relation Age of Onset  . Ulcers Mother 30       peptic ulcer  . Prostate cancer Father 77  . Breast cancer Sister 67       ER+/PR+/Her2- breast cancer  . Breast cancer Paternal Grandmother   . Diabetes Paternal Grandmother   . Cancer Paternal Uncle        unknown cancers    Social History   Tobacco Use  . Smoking status: Never Smoker  . Smokeless tobacco: Never Used  Substance Use Topics  .  Alcohol use: Yes    Comment: socially  . Drug use: No    Allergies:  Allergies  Allergen Reactions  . Aspirin   . Ibuprofen   . Latex   . Penicillins     Medications Prior to Admission  Medication Sig Dispense Refill Last Dose  . amitriptyline (ELAVIL) 50 MG tablet Take 50 mg by mouth at bedtime.   06/14/2018 at Unknown time  . baclofen (LIORESAL) 10 MG tablet Take 10 mg by mouth daily.    06/14/2018 at Unknown time  . budesonide-formoterol (SYMBICORT) 80-4.5 MCG/ACT inhaler Inhale 2 puffs into the lungs every 12 (twelve) hours. 1 Inhaler 5 06/15/2018 at Unknown time  . celecoxib (CELEBREX) 200 MG capsule Take 200 mg by mouth daily.    06/15/2018 at Unknown time  . dexlansoprazole (DEXILANT) 60 MG capsule Take 60 mg by mouth daily.   06/15/2018 at Unknown time  . fenofibrate (TRICOR) 145 MG tablet Take by mouth.   06/14/2018 at Unknown time  . gabapentin (NEURONTIN) 300 MG capsule Take 300 mg by mouth 3 (three) times daily.   06/15/2018 at Unknown time  . lubiprostone (AMITIZA) 24 MCG capsule Take 24 mcg by mouth 2 (two) times daily with a meal.   06/15/2018 at Unknown time  .  Melatonin 10 MG TABS Take by mouth.   06/14/2018 at Unknown time  . montelukast (SINGULAIR) 10 MG tablet Take 1 tablet (10 mg total) by mouth at bedtime. 30 tablet 11 06/14/2018 at Unknown time  . tapentadol (NUCYNTA ER) 100 MG 12 hr tablet Take 100 mg by mouth every 12 (twelve) hours.   06/15/2018 at Unknown time  . topiramate (TOPAMAX) 100 MG tablet Take 100 mg by mouth 2 (two) times daily.   06/15/2018 at Unknown time  . traMADol (ULTRAM) 50 MG tablet Take 50 mg by mouth every 6 (six) hours as needed.   06/15/2018 at Unknown time  . valACYclovir (VALTREX) 500 MG tablet Take 500 mg by mouth daily.   06/15/2018 at Unknown time  . diphenhydrAMINE (BENADRYL ALLERGY) 25 MG tablet Take 2 tablets (50 mg total) by mouth every 8 (eight) hours as needed. (Patient not taking: Reported on 06/15/2018) 10 tablet 0 Not Taking at Unknown time  . Spacer/Aero-Holding Chambers (AEROCHAMBER MV) inhaler Use as instructed 1 each 0 Taking    Review of Systems  Constitutional: Negative.   HENT: Negative.   Eyes: Negative.   Respiratory: Negative.   Gastrointestinal: Positive for abdominal pain.  Musculoskeletal: Negative.   Neurological: Negative.   Hematological: Negative.   Psychiatric/Behavioral: Negative.    Physical Exam   Blood pressure 128/82, pulse 87, temperature 99.9 F (37.7 C), temperature source Oral, resp. rate 20, weight 71.2 kg, SpO2 100 %.  Physical Exam  Constitutional: She is oriented to person, place, and time. She appears well-developed and well-nourished.  HENT:  Head: Normocephalic.  Neck: Normal range of motion.  Respiratory: Effort normal.  GI: Soft.  Genitourinary: Vagina normal.  Genitourinary Comments: NEFG; difficult to complete bimanual exam due to patient's discomfort.   Musculoskeletal: Normal range of motion.  Neurological: She is alert and oriented to person, place, and time.    MAU Course  Procedures  MDM -Spoke with Dr. Landry Mellow, as patient said that she had an appt  with Dr. Landry Mellow today. Per Dr. Landry Mellow, patient stopped Dr. Landry Mellow in the hallway and asked for pain medicine. Dr. Landry Mellow ordered her some Tramadol and set her up with an appt on Tuesday with Dr.  Varnado.  -Patient was observed passing gas and belching in MAU -refusing CBC and CMP, enema and RX for pain meds.   -IM dilaudid given - 2 gas pills given  Assessment and Plan   1. Generalized abdominal pain    2. Patient stable for discharge with recommendation to keep appt on Tuesday with Dr. Simona Huh. Take Tramadol as needed, gas pill and try enema at home.   3. Reviewed with patient that if she has any changes in her symptoms,  she should go to [facility] ED.   3. Explained to patient that her problem is most likely GI related, especially since it started 10 minutes after eating. Her pelvic US today is reassuring.   Recommended that patient follow up with her GI doctor or, at [facility] in case of emergency.   4. Patient verbalized understanding; amenable to plan of care.   Mervyn Skeeters Quashon Jesus 06/15/2018, 5:03 PM

## 2018-06-15 NOTE — MAU Note (Signed)
Bimanual exam by Eusebio Me CNM

## 2018-06-15 NOTE — Discharge Instructions (Signed)
Abdominal Bloating °When you have abdominal bloating, your abdomen may feel full, tight, or painful. It may also look bigger than normal or swollen (distended). Common causes of abdominal bloating include: °· Swallowing air. °· Constipation. °· Problems digesting food. °· Eating too much. °· Irritable bowel syndrome. This is a condition that affects the large intestine. °· Lactose intolerance. This is an inability to digest lactose, a natural sugar in dairy products. °· Celiac disease. This is a condition that affects the ability to digest gluten, a protein found in some grains. °· Gastroparesis. This is a condition that slows down the movement of food in the stomach and small intestine. It is more common in people with diabetes mellitus. °· Gastroesophageal reflux disease (GERD). This is a digestive condition that makes stomach acid flow back into the esophagus. °· Urinary retention. This means that the body is holding onto urine, and the bladder cannot be emptied all the way. ° °Follow these instructions at home: °Eating and drinking °· Avoid eating too much. °· Try not to swallow air while talking or eating. °· Avoid eating while lying down. °· Avoid these foods and drinks: °? Foods that cause gas, such as broccoli, cabbage, cauliflower, and baked beans. °? Carbonated drinks. °? Hard candy. °? Chewing gum. °Medicines °· Take over-the-counter and prescription medicines only as told by your health care provider. °· Take probiotic medicines. These medicines contain live bacteria or yeasts that can help digestion. °· Take coated peppermint oil capsules. °Activity °· Try to exercise regularly. Exercise may help to relieve bloating that is caused by gas and relieve constipation. °General instructions °· Keep all follow-up visits as told by your health care provider. This is important. °Contact a health care provider if: °· You have nausea and vomiting. °· You have diarrhea. °· You have abdominal pain. °· You have  unusual weight loss or weight gain. °· You have severe pain, and medicines do not help. °Get help right away if: °· You have severe chest pain. °· You have trouble breathing. °· You have shortness of breath. °· You have trouble urinating. °· You have darker urine than normal. °· You have blood in your stools or have dark, tarry stools. °Summary °· Abdominal bloating means that the abdomen is swollen. °· Common causes of abdominal bloating are swallowing air, constipation, and problems digesting food. °· Avoid eating too much and avoid swallowing air. °· Avoid foods that cause gas, carbonated drinks, hard candy, and chewing gum. °This information is not intended to replace advice given to you by your health care provider. Make sure you discuss any questions you have with your health care provider. °Document Released: 08/26/2016 Document Revised: 08/26/2016 Document Reviewed: 08/26/2016 °Elsevier Interactive Patient Education © 2018 [employer] ° °

## 2018-06-15 NOTE — MAU Note (Signed)
Had a PET scan that showed a cyst on the left side, but  Wasn't bothering her. Started having some urine and vag issues a few wks ago.. Saw primary was told every thing was fine.  Went to GYN, had internal and [place], were very uncomfortable. Low back pain x3 days. Hx of back pain.  Usual things not helping.  Ovaries removed 8 yrs ago. ? Reason for cyst.  OTC meds not helping.   Was given Tramadol, didn't touch it. Pain got worse this afternoon, felt like it just expanded throughout abd.  Constant pain, worse in waves

## 2018-06-18 DIAGNOSIS — F4325 Adjustment disorder with mixed disturbance of emotions and conduct: Secondary | ICD-10-CM | POA: Diagnosis not present

## 2018-06-19 DIAGNOSIS — N949 Unspecified condition associated with female genital organs and menstrual cycle: Secondary | ICD-10-CM | POA: Diagnosis not present

## 2018-06-19 DIAGNOSIS — N3010 Interstitial cystitis (chronic) without hematuria: Secondary | ICD-10-CM | POA: Diagnosis not present

## 2018-06-19 DIAGNOSIS — G894 Chronic pain syndrome: Secondary | ICD-10-CM | POA: Diagnosis not present

## 2018-06-19 DIAGNOSIS — M25511 Pain in right shoulder: Secondary | ICD-10-CM | POA: Diagnosis not present

## 2018-06-19 DIAGNOSIS — M25562 Pain in left knee: Secondary | ICD-10-CM | POA: Diagnosis not present

## 2018-06-19 DIAGNOSIS — M545 Low back pain: Secondary | ICD-10-CM | POA: Diagnosis not present

## 2018-06-19 DIAGNOSIS — M542 Cervicalgia: Secondary | ICD-10-CM | POA: Diagnosis not present

## 2018-06-19 DIAGNOSIS — M6281 Muscle weakness (generalized): Secondary | ICD-10-CM | POA: Diagnosis not present

## 2018-06-20 ENCOUNTER — Other Ambulatory Visit: Payer: Self-pay | Admitting: Gastroenterology

## 2018-06-20 DIAGNOSIS — R1011 Right upper quadrant pain: Secondary | ICD-10-CM

## 2018-06-20 DIAGNOSIS — R1012 Left upper quadrant pain: Secondary | ICD-10-CM

## 2018-06-21 DIAGNOSIS — M25511 Pain in right shoulder: Secondary | ICD-10-CM | POA: Diagnosis not present

## 2018-06-21 DIAGNOSIS — M542 Cervicalgia: Secondary | ICD-10-CM | POA: Diagnosis not present

## 2018-06-21 DIAGNOSIS — M545 Low back pain: Secondary | ICD-10-CM | POA: Diagnosis not present

## 2018-06-21 DIAGNOSIS — M25562 Pain in left knee: Secondary | ICD-10-CM | POA: Diagnosis not present

## 2018-06-21 DIAGNOSIS — M6281 Muscle weakness (generalized): Secondary | ICD-10-CM | POA: Diagnosis not present

## 2018-06-21 DIAGNOSIS — G894 Chronic pain syndrome: Secondary | ICD-10-CM | POA: Diagnosis not present

## 2018-06-27 ENCOUNTER — Ambulatory Visit
Admission: RE | Admit: 2018-06-27 | Discharge: 2018-06-27 | Disposition: A | Discharge status: Home / Self Care | Payer: Medicare Other | Source: Ambulatory Visit | Attending: Gastroenterology | Admitting: Gastroenterology

## 2018-06-27 DIAGNOSIS — R1011 Right upper quadrant pain: Secondary | ICD-10-CM

## 2018-06-27 DIAGNOSIS — R1084 Generalized abdominal pain: Secondary | ICD-10-CM | POA: Diagnosis not present

## 2018-06-27 DIAGNOSIS — R1012 Left upper quadrant pain: Secondary | ICD-10-CM

## 2018-06-29 ENCOUNTER — Other Ambulatory Visit (HOSPITAL_COMMUNITY): Payer: Self-pay | Admitting: Physician Assistant

## 2018-06-29 DIAGNOSIS — K824 Cholesterolosis of gallbladder: Secondary | ICD-10-CM

## 2018-06-29 DIAGNOSIS — K8020 Calculus of gallbladder without cholecystitis without obstruction: Secondary | ICD-10-CM | POA: Diagnosis not present

## 2018-06-29 DIAGNOSIS — R1011 Right upper quadrant pain: Secondary | ICD-10-CM | POA: Diagnosis not present

## 2018-06-29 DIAGNOSIS — R1033 Periumbilical pain: Secondary | ICD-10-CM | POA: Diagnosis not present

## 2018-06-29 DIAGNOSIS — R102 Pelvic and perineal pain: Secondary | ICD-10-CM | POA: Diagnosis not present

## 2018-07-02 DIAGNOSIS — F4325 Adjustment disorder with mixed disturbance of emotions and conduct: Secondary | ICD-10-CM | POA: Diagnosis not present

## 2018-07-04 DIAGNOSIS — M545 Low back pain: Secondary | ICD-10-CM | POA: Diagnosis not present

## 2018-07-04 DIAGNOSIS — G894 Chronic pain syndrome: Secondary | ICD-10-CM | POA: Diagnosis not present

## 2018-07-04 DIAGNOSIS — M6281 Muscle weakness (generalized): Secondary | ICD-10-CM | POA: Diagnosis not present

## 2018-07-04 DIAGNOSIS — M542 Cervicalgia: Secondary | ICD-10-CM | POA: Diagnosis not present

## 2018-07-04 DIAGNOSIS — M25511 Pain in right shoulder: Secondary | ICD-10-CM | POA: Diagnosis not present

## 2018-07-04 DIAGNOSIS — M25562 Pain in left knee: Secondary | ICD-10-CM | POA: Diagnosis not present

## 2018-07-09 DIAGNOSIS — Z79891 Long term (current) use of opiate analgesic: Secondary | ICD-10-CM | POA: Diagnosis not present

## 2018-07-09 DIAGNOSIS — Z79899 Other long term (current) drug therapy: Secondary | ICD-10-CM | POA: Diagnosis not present

## 2018-07-09 DIAGNOSIS — M5481 Occipital neuralgia: Secondary | ICD-10-CM | POA: Diagnosis not present

## 2018-07-09 DIAGNOSIS — G894 Chronic pain syndrome: Secondary | ICD-10-CM | POA: Diagnosis not present

## 2018-07-09 DIAGNOSIS — M47816 Spondylosis without myelopathy or radiculopathy, lumbar region: Secondary | ICD-10-CM | POA: Diagnosis not present

## 2018-07-09 DIAGNOSIS — M25519 Pain in unspecified shoulder: Secondary | ICD-10-CM | POA: Diagnosis not present

## 2018-07-13 DIAGNOSIS — J449 Chronic obstructive pulmonary disease, unspecified: Secondary | ICD-10-CM | POA: Diagnosis not present

## 2018-07-13 DIAGNOSIS — J209 Acute bronchitis, unspecified: Secondary | ICD-10-CM | POA: Diagnosis not present

## 2018-07-16 ENCOUNTER — Ambulatory Visit (HOSPITAL_COMMUNITY): Admission: RE | Admit: 2018-07-16 | Payer: Medicare Other | Source: Ambulatory Visit

## 2018-07-16 ENCOUNTER — Encounter (HOSPITAL_COMMUNITY): Payer: Self-pay

## 2018-07-17 ENCOUNTER — Encounter: Payer: Self-pay | Admitting: Primary Care

## 2018-07-17 ENCOUNTER — Ambulatory Visit (INDEPENDENT_AMBULATORY_CARE_PROVIDER_SITE_OTHER): Payer: Medicare Other | Admitting: Primary Care

## 2018-07-17 ENCOUNTER — Telehealth: Payer: Self-pay | Admitting: Pulmonary Disease

## 2018-07-17 ENCOUNTER — Telehealth: Payer: Self-pay | Admitting: Primary Care

## 2018-07-17 ENCOUNTER — Ambulatory Visit
Admission: RE | Admit: 2018-07-17 | Discharge: 2018-07-17 | Disposition: A | Discharge status: Home / Self Care | Payer: Medicare Other | Source: Ambulatory Visit | Attending: Primary Care | Admitting: Primary Care

## 2018-07-17 VITALS — BP 128/78 | HR 106 | Temp 99.0°F | Ht 63.0 in | Wt 155.6 lb

## 2018-07-17 DIAGNOSIS — B9689 Other specified bacterial agents as the cause of diseases classified elsewhere: Secondary | ICD-10-CM | POA: Diagnosis not present

## 2018-07-17 DIAGNOSIS — J209 Acute bronchitis, unspecified: Secondary | ICD-10-CM

## 2018-07-17 DIAGNOSIS — J9811 Atelectasis: Secondary | ICD-10-CM | POA: Diagnosis not present

## 2018-07-17 DIAGNOSIS — J45909 Unspecified asthma, uncomplicated: Secondary | ICD-10-CM

## 2018-07-17 DIAGNOSIS — R9389 Abnormal findings on diagnostic imaging of other specified body structures: Secondary | ICD-10-CM | POA: Diagnosis not present

## 2018-07-17 DIAGNOSIS — J019 Acute sinusitis, unspecified: Secondary | ICD-10-CM | POA: Diagnosis not present

## 2018-07-17 LAB — NITRIC OXIDE: FeNO level (ppb): 13

## 2018-07-17 MED ORDER — PREDNISONE 10 MG PO TABS: ORAL_TABLET | ORAL | 0 refills | Status: DC

## 2018-07-17 MED ORDER — DOXYCYCLINE HYCLATE 100 MG PO TABS: 100.0000 mg | ORAL_TABLET | Freq: Two times a day (BID) | ORAL | 0 refills | Status: DC

## 2018-07-17 NOTE — Assessment & Plan Note (Addendum)
Hx asthma in her 97's Complains of wheezing and cough IgE 47, continues singulair  Symbicort 80- two puffs twice daily RX prednisone 20mg  x 5 days FENO 13, normal Needs full PFTs in 3 months and OV with Dr. Valeta Harms

## 2018-07-17 NOTE — Assessment & Plan Note (Addendum)
Doxycyline x 1 week Continue mucinex BID, flonase and singulair

## 2018-07-17 NOTE — Progress Notes (Signed)
@Patient  ID: Cassidy Olsen, female    DOB: 1961/04/11, 57 y.o.   MRN: [id number]  Chief Complaint  Patient presents with  . Acute Visit    cough with white mucus, chest sore, wheezing with lying down, diarrhea, sore throat, lots of nasal mucus yellow    Referring provider: Leeroy Cha,*  HPI: 57 year old female, never smoked.  Past medical history significant for allergic rhinitis, community-acquired pneumonia, asthma diagnosed in her 45s.  Patient of Dr. Valeta Harms, last seen on 05/30/2018.  Maintained on Symbicort 2 puffs twice daily, Singulair 10 mg daily, Flonase and as needed albuterol rescue inhaler.  Consider repeat PFTs if still having ongoing respiratory symptoms.  IgE 47, allergy panel negative.  Abnormal CT findings for left lower lobe lung nodule now resolved with small area of groundglass on recent PET imaging, low SUV uptake 2.2 max.  Needs repeat chest x-ray in early December to ensure complete resolution of left lung opacity.  07/17/2018 Patient presents today with complaints of continued cough since Thanksgiving. She has not had repeat chest x-ray. Complains of cough with white to yellow mucus. Associated wheezing when lying flat and sore throat. Symptoms started 2 weeks ago, states that she took Alka-Seltzer plus for 1 week and then started Mucinex. She had some leftover azithromycin 250mg  and took 4 tabs of this. Patient presented to PCP 4 days ago for above symptoms and was given additional course of azithromycin and recommended that she take Delsym cough syrup and Tessalon Perles as needed.  She has not found this to be helpful.  Still complains of cough with nasal mucus, she is bringing up a significant amount of yellow sputum.  Denies headache or sinus pressure. Low grade temp. FENO 13 today. Needs CXR today.  We will schedule patient for full PFTs prior to regular scheduled office visit with Dr. Valeta Harms in 3 to 4 months.    Chest Imaging:  CXR 04/17/2018: Left  sided nodular density   Nuclear medicine PET scan: 05/21/2018: SUV max 2.2 left lower lobe bandlike opacity seen on prior CT imaging.  Much improved the last nodular masslike appearance. 4 mm right ureteropelvic junction calculus, cystic left adnexal mass no metabolic activity.  Pulmonary Functions Testing Results: No flowsheet data found.  FeNO: None   Allergies  Allergen Reactions  . Aspirin   . Codeine     Cough meds with codeine-have withdraw symptoms when done  . Ibuprofen   . Latex   . Penicillins     Immunization History  Administered Date(s) Administered  . Influenza Inj Mdck Quad Pf 05/24/2017, 05/09/2018    Past Medical History:  Diagnosis Date  . Asthma   . Chronic pain   . Family history of breast cancer   . Family history of prostate cancer   . GERD (gastroesophageal reflux disease)   . High cholesterol   . IBS (irritable bowel syndrome)   . Migraine   . Occipital neuralgia   . Osteoarthritis   . Spinal stenosis   . Trigeminal neuralgia   . Trigeminal neuralgia     Tobacco History: Social History   Tobacco Use  Smoking Status Never Smoker  Smokeless Tobacco Never Used   Counseling given: Not Answered   Outpatient Medications Prior to Visit  Medication Sig Dispense Refill  . amitriptyline (ELAVIL) 50 MG tablet Take 50 mg by mouth at bedtime.    . baclofen (LIORESAL) 10 MG tablet Take 10 mg by mouth daily.     . benzonatate (TESSALON) 100  MG capsule Take 100 mg by mouth 3 (three) times daily as needed for cough.    . budesonide-formoterol (SYMBICORT) 80-4.5 MCG/ACT inhaler Inhale 2 puffs into the lungs every 12 (twelve) hours. 1 Inhaler 5  . celecoxib (CELEBREX) 200 MG capsule Take 200 mg by mouth daily.     Marland Kitchen dexlansoprazole (DEXILANT) 60 MG capsule Take 60 mg by mouth daily.    Marland Kitchen dextromethorphan (DELSYM) 30 MG/5ML liquid Take 30 mg by mouth as needed for cough.    . diphenhydrAMINE (BENADRYL ALLERGY) 25 MG tablet Take 2 tablets (50 mg  total) by mouth every 8 (eight) hours as needed. 10 tablet 0  . fenofibrate (TRICOR) 145 MG tablet Take by mouth.    . gabapentin (NEURONTIN) 300 MG capsule Take 300 mg by mouth 3 (three) times daily.    Marland Kitchen lubiprostone (AMITIZA) 24 MCG capsule Take 24 mcg by mouth 2 (two) times daily with a meal.    . Melatonin 10 MG TABS Take by mouth.    . montelukast (SINGULAIR) 10 MG tablet Take 1 tablet (10 mg total) by mouth at bedtime. 30 tablet 11  . Simethicone 180 MG CAPS Take 1 capsule (180 mg total) by mouth 2 (two) times daily. 30 capsule 0  . Spacer/Aero-Holding Chambers (AEROCHAMBER MV) inhaler Use as instructed 1 each 0  . tapentadol (NUCYNTA ER) 100 MG 12 hr tablet Take 100 mg by mouth every 12 (twelve) hours.    . topiramate (TOPAMAX) 100 MG tablet Take 100 mg by mouth 2 (two) times daily.    . valACYclovir (VALTREX) 500 MG tablet Take 500 mg by mouth daily.    . traMADol (ULTRAM) 50 MG tablet Take 50 mg by mouth every 6 (six) hours as needed.     No facility-administered medications prior to visit.     Review of Systems  Review of Systems  Constitutional: Negative.   HENT: Positive for congestion and postnasal drip. Negative for sinus pressure and sinus pain.   Respiratory: Positive for cough, shortness of breath and wheezing. Negative for apnea.   Cardiovascular: Negative.     Physical Exam  BP 128/78 (BP Location: Left Arm, Cuff Size: Normal)   Pulse (!) 106   Temp 99 F (37.2 C)   Ht 5\' 3"  (1.6 m)   Wt 155 lb 9.6 oz (70.6 kg)   SpO2 96%   BMI 27.56 kg/m  Physical Exam  Constitutional: She is oriented to person, place, and time. She appears well-developed and well-nourished. No distress.  Appears well   HENT:  Head: Normocephalic and atraumatic.  Right Ear: External ear normal.  Left Ear: External ear normal.  Nose: Nose normal.  Mouth/Throat: Oropharynx is clear and moist. No oropharyngeal exudate.  Audible sinus congestion   Eyes: Pupils are equal, round, and  reactive to light. EOM are normal.  Neck: Normal range of motion. Neck supple.  Cardiovascular: Regular rhythm.  Regular, HR 106  Pulmonary/Chest: Effort normal. No stridor. No respiratory distress.  Upper airway cough/ exp wheeze. Lungs CTA bilaterally   Musculoskeletal: Normal range of motion.  Neurological: She is alert and oriented to person, place, and time.  Skin: Skin is warm and dry.  Psychiatric: She has a normal mood and affect. Her behavior is normal. Judgment and thought content normal.     Lab Results:  CBC    Component Value Date/Time   WBC 8.1 04/18/2018 1412   RBC 3.96 04/18/2018 1412   HGB 12.3 04/18/2018 1412   HCT  36.5 04/18/2018 1412   PLT 429 (H) 04/18/2018 1412   MCV 92.2 04/18/2018 1412   MCH 31.1 04/18/2018 1412   MCHC 33.7 04/18/2018 1412   RDW 13.2 04/18/2018 1412   LYMPHSABS 3,653 04/18/2018 1412   MONOABS 0.6 11/18/2016 2303   EOSABS 211 04/18/2018 1412   BASOSABS 73 04/18/2018 1412    BMET No results found for: NA, K, CL, CO2, GLUCOSE, BUN, CREATININE, CALCIUM, GFRNONAA, GFRAA  BNP No results found for: BNP  ProBNP No results found for: PROBNP  Imaging: Dg Chest 2 View  Result Date: 07/17/2018 CLINICAL DATA:  Cough and fever EXAM: CHEST - 2 VIEW COMPARISON:  Chest CT May 21, 2018. Chest radiograph April 17, 2018 FINDINGS: In the area of previous consolidation, there is slight atelectasis in the left mid lung region. Lungs elsewhere are clear. Heart size and pulmonary vascularity are normal. No adenopathy. There is postoperative change in the lower cervical spine. IMPRESSION: Mild left midlung atelectasis an area of previous infiltrate. Lungs elsewhere clear. No new opacity evident. No adenopathy. Electronically Signed   By: Lowella Grip III M.D.   On: 07/17/2018 17:01   US Abdomen Complete  Result Date: 06/27/2018 CLINICAL DATA:  Right upper quadrant abdominal pain. EXAM: ABDOMEN ULTRASOUND COMPLETE COMPARISON:  Ultrasound of  October 11, 2016. CT scan of October 04, 2016. FINDINGS: Gallbladder: 5 mm calculus is noted without gallbladder wall thickening or pericholecystic fluid. 4 mm polyp is noted. No sonographic Murphy's sign is noted. Common bile duct: Diameter: 3 mm which is within normal limits. Liver: No focal lesion identified. Within normal limits in parenchymal echogenicity. Portal vein is patent on color Doppler imaging with normal direction of blood flow towards the liver. IVC: No abnormality visualized. Pancreas: Visualized portion unremarkable. Spleen: Size and appearance within normal limits. Right Kidney: Length: 10 cm. 9 mm nonobstructive calculus is noted in lower pole. 1.5 cm simple cyst is noted. Echogenicity within normal limits. No mass or hydronephrosis visualized. Left Kidney: Length: 10 cm. 9 mm nonobstructive calculus is seen in upper pole. Echogenicity within normal limits. No mass or hydronephrosis visualized. Abdominal aorta: No aneurysm visualized. Other findings: None. IMPRESSION: Cholelithiasis without inflammation. 4 mm gallbladder polyp is noted. Bilateral nonobstructive nephrolithiasis. No other abnormality seen in the abdomen. Electronically Signed   By: Marijo Conception, M.D.   On: 06/27/2018 15:48     Assessment & Plan:   Acute bacterial sinusitis Doxycyline x 1 week Continue mucinex BID, flonase and singulair    Asthma Hx asthma in her 30's Complains of wheezing and cough IgE 47, continues singulair  Symbicort 80- two puffs twice daily RX prednisone 20mg  x 5 days FENO 13, normal Needs full PFTs in 3 months and OV with Dr. Valeta Harms   Abnormal chest CT Abnormal CT findings 04/19/18 for left lower lobe lung nodule now resolved with small area of groundglass on recent PET imaging, low SUV uptake 2.2 max  Repeat CXR 07/17/2018 - showed Mild left midlung atelectasis an area of previous infiltrate. Lungs elsewhere clear. No new opacity evident. No adenopathy   Martyn Ehrich,  NP 07/17/2018

## 2018-07-17 NOTE — Assessment & Plan Note (Signed)
Abnormal CT findings 04/19/18 for left lower lobe lung nodule now resolved with small area of groundglass on recent PET imaging, low SUV uptake 2.2 max  Repeat CXR 07/17/2018 - showed Mild left midlung atelectasis an area of previous infiltrate. Lungs elsewhere clear. No new opacity evident. No adenopathy

## 2018-07-17 NOTE — Telephone Encounter (Signed)
Needs PFTs prior to follow up with Dr. Valeta Harms in 3-4 months (around April 2020)

## 2018-07-17 NOTE — Patient Instructions (Addendum)
Prednisone 20mg  x 5 days  Doxycyline 1 tab twice daily x 7 days (take probiotic with abx)  CXR today  Continue mucinex twice daily Continue Delsym cough syrup twice daily  Return if symptoms do not improve in 7-10 days   Follow up with Dr. Valeta Harms in 3-4 months

## 2018-07-17 NOTE — Telephone Encounter (Signed)
Called and spoke with pt who states she has had a cough since Thanksgiving, 07/05/18. Pt states she was coughing up mucus but states now if she coughs up anything it is thick and white.  Pt was taken off of mucinex but is now taking delsym, benzonatate as well as prednisone, azithromycin.  Stated to pt, due to cough not being better after being on different meds, we should get her to come in for an appt. Pt expressed understanding. Scheduled an acute visit for pt today, 12/10 with Derl Barrow, NP. Nothing further needed.

## 2018-07-18 ENCOUNTER — Other Ambulatory Visit: Payer: Self-pay

## 2018-07-18 ENCOUNTER — Telehealth: Payer: Self-pay | Admitting: Primary Care

## 2018-07-18 DIAGNOSIS — R05 Cough: Secondary | ICD-10-CM

## 2018-07-18 DIAGNOSIS — M7501 Adhesive capsulitis of right shoulder: Secondary | ICD-10-CM | POA: Diagnosis not present

## 2018-07-18 DIAGNOSIS — J9811 Atelectasis: Secondary | ICD-10-CM

## 2018-07-18 DIAGNOSIS — M25511 Pain in right shoulder: Secondary | ICD-10-CM | POA: Diagnosis not present

## 2018-07-18 DIAGNOSIS — R059 Cough, unspecified: Secondary | ICD-10-CM

## 2018-07-18 MED ORDER — DEXTROMETHORPHAN-GUAIFENESIN 10-100 MG/5ML PO LIQD: 5.0000 mL | ORAL | 1 refills | Status: DC | PRN

## 2018-07-18 NOTE — Telephone Encounter (Signed)
Patient called about another issue, so I advised her she needs PFT and follow up visit with BI in April.  We scheduled this for 11/07/2018.  No call back is necessary.

## 2018-07-18 NOTE — Telephone Encounter (Signed)
Called and spoke with patient she stated that she was up all night with her cough that got worse and she was not able to sleep. Patient was seen yesterday for this and was given prednisone as well as doxycycline. She was also told to take the mucinex BID and the delsym BID for cough. Patient stated that the pharmacist told her not to do that because it contraindicates each other. Patient is requesting something to do for the cough since she can not take those medications together and the tessalon perles were not working for her. Patient would also like a work note for today because she will be out of work due to her cough. She stated that she will come by this morning to pick it up after her other doctors appointment.   Beth please advise once available, thank you.   2. Martyn Ehrich, NP (Nurse Practitioner) at 07/17/2018 10:39 AM - Signed    Prednisone 20mg  x 5 days  Doxycyline 1 tab twice daily x 7 days (take probiotic with abx)  CXR today  Continue mucinex twice daily Continue Delsym cough syrup twice daily  Return if symptoms do not improve in 7-10 days   Follow up with Dr. Valeta Harms in 3-4 months

## 2018-07-18 NOTE — Progress Notes (Signed)
Agree. Thanks for seeing her Garner Nash, DO [facility] 07/18/2018 10:41 AM

## 2018-07-18 NOTE — Telephone Encounter (Signed)
I spoke with patient, Note is fine. Will send in Tussin-Dm prescription. Advised to stop mucinex and delsym if she is going to take prescription cough syrup.

## 2018-07-19 DIAGNOSIS — R102 Pelvic and perineal pain: Secondary | ICD-10-CM | POA: Diagnosis not present

## 2018-07-19 DIAGNOSIS — N3010 Interstitial cystitis (chronic) without hematuria: Secondary | ICD-10-CM | POA: Diagnosis not present

## 2018-07-24 DIAGNOSIS — R109 Unspecified abdominal pain: Secondary | ICD-10-CM | POA: Diagnosis not present

## 2018-07-24 DIAGNOSIS — E559 Vitamin D deficiency, unspecified: Secondary | ICD-10-CM | POA: Diagnosis not present

## 2018-07-24 DIAGNOSIS — M6281 Muscle weakness (generalized): Secondary | ICD-10-CM | POA: Diagnosis not present

## 2018-07-24 DIAGNOSIS — R197 Diarrhea, unspecified: Secondary | ICD-10-CM | POA: Diagnosis not present

## 2018-07-24 DIAGNOSIS — M7501 Adhesive capsulitis of right shoulder: Secondary | ICD-10-CM | POA: Diagnosis not present

## 2018-07-24 DIAGNOSIS — R1011 Right upper quadrant pain: Secondary | ICD-10-CM | POA: Diagnosis not present

## 2018-07-24 DIAGNOSIS — R194 Change in bowel habit: Secondary | ICD-10-CM | POA: Diagnosis not present

## 2018-07-24 DIAGNOSIS — R110 Nausea: Secondary | ICD-10-CM | POA: Diagnosis not present

## 2018-07-24 DIAGNOSIS — M25511 Pain in right shoulder: Secondary | ICD-10-CM | POA: Diagnosis not present

## 2018-07-24 DIAGNOSIS — M25611 Stiffness of right shoulder, not elsewhere classified: Secondary | ICD-10-CM | POA: Diagnosis not present

## 2018-07-26 DIAGNOSIS — M25511 Pain in right shoulder: Secondary | ICD-10-CM | POA: Diagnosis not present

## 2018-07-26 DIAGNOSIS — R1011 Right upper quadrant pain: Secondary | ICD-10-CM | POA: Diagnosis not present

## 2018-07-26 DIAGNOSIS — M25611 Stiffness of right shoulder, not elsewhere classified: Secondary | ICD-10-CM | POA: Diagnosis not present

## 2018-07-26 DIAGNOSIS — M6281 Muscle weakness (generalized): Secondary | ICD-10-CM | POA: Diagnosis not present

## 2018-07-26 DIAGNOSIS — M7501 Adhesive capsulitis of right shoulder: Secondary | ICD-10-CM | POA: Diagnosis not present

## 2018-07-28 DIAGNOSIS — K5900 Constipation, unspecified: Secondary | ICD-10-CM | POA: Diagnosis not present

## 2018-07-28 DIAGNOSIS — R1084 Generalized abdominal pain: Secondary | ICD-10-CM | POA: Diagnosis not present

## 2018-07-28 DIAGNOSIS — R197 Diarrhea, unspecified: Secondary | ICD-10-CM | POA: Diagnosis not present

## 2018-08-02 DIAGNOSIS — M25511 Pain in right shoulder: Secondary | ICD-10-CM | POA: Diagnosis not present

## 2018-08-02 DIAGNOSIS — M25611 Stiffness of right shoulder, not elsewhere classified: Secondary | ICD-10-CM | POA: Diagnosis not present

## 2018-08-02 DIAGNOSIS — M7501 Adhesive capsulitis of right shoulder: Secondary | ICD-10-CM | POA: Diagnosis not present

## 2018-08-02 DIAGNOSIS — M6281 Muscle weakness (generalized): Secondary | ICD-10-CM | POA: Diagnosis not present

## 2018-08-06 DIAGNOSIS — M25611 Stiffness of right shoulder, not elsewhere classified: Secondary | ICD-10-CM | POA: Diagnosis not present

## 2018-08-06 DIAGNOSIS — M6281 Muscle weakness (generalized): Secondary | ICD-10-CM | POA: Diagnosis not present

## 2018-08-06 DIAGNOSIS — M25511 Pain in right shoulder: Secondary | ICD-10-CM | POA: Diagnosis not present

## 2018-08-06 DIAGNOSIS — R109 Unspecified abdominal pain: Secondary | ICD-10-CM | POA: Diagnosis not present

## 2018-08-06 DIAGNOSIS — K589 Irritable bowel syndrome without diarrhea: Secondary | ICD-10-CM | POA: Diagnosis not present

## 2018-08-06 DIAGNOSIS — M7501 Adhesive capsulitis of right shoulder: Secondary | ICD-10-CM | POA: Diagnosis not present

## 2018-08-13 DIAGNOSIS — M7501 Adhesive capsulitis of right shoulder: Secondary | ICD-10-CM | POA: Diagnosis not present

## 2018-08-13 DIAGNOSIS — M25511 Pain in right shoulder: Secondary | ICD-10-CM | POA: Diagnosis not present

## 2018-08-13 DIAGNOSIS — M25611 Stiffness of right shoulder, not elsewhere classified: Secondary | ICD-10-CM | POA: Diagnosis not present

## 2018-08-13 DIAGNOSIS — M6281 Muscle weakness (generalized): Secondary | ICD-10-CM | POA: Diagnosis not present

## 2018-08-14 DIAGNOSIS — R194 Change in bowel habit: Secondary | ICD-10-CM | POA: Diagnosis not present

## 2018-08-14 DIAGNOSIS — R1011 Right upper quadrant pain: Secondary | ICD-10-CM | POA: Diagnosis not present

## 2018-08-14 DIAGNOSIS — R110 Nausea: Secondary | ICD-10-CM | POA: Diagnosis not present

## 2018-08-21 DIAGNOSIS — M25519 Pain in unspecified shoulder: Secondary | ICD-10-CM | POA: Diagnosis not present

## 2018-08-21 DIAGNOSIS — M47816 Spondylosis without myelopathy or radiculopathy, lumbar region: Secondary | ICD-10-CM | POA: Diagnosis not present

## 2018-08-21 DIAGNOSIS — G894 Chronic pain syndrome: Secondary | ICD-10-CM | POA: Diagnosis not present

## 2018-08-21 DIAGNOSIS — M5481 Occipital neuralgia: Secondary | ICD-10-CM | POA: Diagnosis not present

## 2018-08-23 ENCOUNTER — Encounter (HOSPITAL_COMMUNITY): Payer: Medicare Other

## 2018-08-24 DIAGNOSIS — M62838 Other muscle spasm: Secondary | ICD-10-CM | POA: Diagnosis not present

## 2018-08-24 DIAGNOSIS — N3010 Interstitial cystitis (chronic) without hematuria: Secondary | ICD-10-CM | POA: Diagnosis not present

## 2018-08-24 DIAGNOSIS — R102 Pelvic and perineal pain: Secondary | ICD-10-CM | POA: Diagnosis not present

## 2018-08-24 DIAGNOSIS — M6289 Other specified disorders of muscle: Secondary | ICD-10-CM | POA: Diagnosis not present

## 2018-08-28 DIAGNOSIS — M6281 Muscle weakness (generalized): Secondary | ICD-10-CM | POA: Diagnosis not present

## 2018-08-28 DIAGNOSIS — G894 Chronic pain syndrome: Secondary | ICD-10-CM | POA: Diagnosis not present

## 2018-08-28 DIAGNOSIS — M542 Cervicalgia: Secondary | ICD-10-CM | POA: Diagnosis not present

## 2018-08-28 DIAGNOSIS — M25511 Pain in right shoulder: Secondary | ICD-10-CM | POA: Diagnosis not present

## 2018-08-28 DIAGNOSIS — M25562 Pain in left knee: Secondary | ICD-10-CM | POA: Diagnosis not present

## 2018-08-28 DIAGNOSIS — M545 Low back pain: Secondary | ICD-10-CM | POA: Diagnosis not present

## 2018-09-04 DIAGNOSIS — M6281 Muscle weakness (generalized): Secondary | ICD-10-CM | POA: Diagnosis not present

## 2018-09-04 DIAGNOSIS — G894 Chronic pain syndrome: Secondary | ICD-10-CM | POA: Diagnosis not present

## 2018-09-04 DIAGNOSIS — M25562 Pain in left knee: Secondary | ICD-10-CM | POA: Diagnosis not present

## 2018-09-04 DIAGNOSIS — M542 Cervicalgia: Secondary | ICD-10-CM | POA: Diagnosis not present

## 2018-09-04 DIAGNOSIS — M545 Low back pain: Secondary | ICD-10-CM | POA: Diagnosis not present

## 2018-09-04 DIAGNOSIS — M25511 Pain in right shoulder: Secondary | ICD-10-CM | POA: Diagnosis not present

## 2018-09-06 DIAGNOSIS — M25511 Pain in right shoulder: Secondary | ICD-10-CM | POA: Diagnosis not present

## 2018-09-06 DIAGNOSIS — M25512 Pain in left shoulder: Secondary | ICD-10-CM | POA: Diagnosis not present

## 2018-09-13 DIAGNOSIS — F4325 Adjustment disorder with mixed disturbance of emotions and conduct: Secondary | ICD-10-CM | POA: Diagnosis not present

## 2018-09-13 DIAGNOSIS — M25511 Pain in right shoulder: Secondary | ICD-10-CM | POA: Diagnosis not present

## 2018-09-25 DIAGNOSIS — M75101 Unspecified rotator cuff tear or rupture of right shoulder, not specified as traumatic: Secondary | ICD-10-CM | POA: Diagnosis not present

## 2018-10-01 DIAGNOSIS — R197 Diarrhea, unspecified: Secondary | ICD-10-CM | POA: Diagnosis not present

## 2018-10-01 DIAGNOSIS — R140 Abdominal distension (gaseous): Secondary | ICD-10-CM | POA: Diagnosis not present

## 2018-10-01 DIAGNOSIS — R1084 Generalized abdominal pain: Secondary | ICD-10-CM | POA: Diagnosis not present

## 2018-10-04 ENCOUNTER — Other Ambulatory Visit: Payer: Self-pay | Admitting: Obstetrics and Gynecology

## 2018-10-04 DIAGNOSIS — Z1231 Encounter for screening mammogram for malignant neoplasm of breast: Secondary | ICD-10-CM

## 2018-10-05 DIAGNOSIS — M47816 Spondylosis without myelopathy or radiculopathy, lumbar region: Secondary | ICD-10-CM | POA: Diagnosis not present

## 2018-10-05 DIAGNOSIS — M5481 Occipital neuralgia: Secondary | ICD-10-CM | POA: Diagnosis not present

## 2018-10-05 DIAGNOSIS — G894 Chronic pain syndrome: Secondary | ICD-10-CM | POA: Diagnosis not present

## 2018-10-05 DIAGNOSIS — M25519 Pain in unspecified shoulder: Secondary | ICD-10-CM | POA: Diagnosis not present

## 2018-10-11 DIAGNOSIS — F4325 Adjustment disorder with mixed disturbance of emotions and conduct: Secondary | ICD-10-CM | POA: Diagnosis not present

## 2018-10-16 DIAGNOSIS — M545 Low back pain: Secondary | ICD-10-CM | POA: Diagnosis not present

## 2018-10-16 DIAGNOSIS — M259 Joint disorder, unspecified: Secondary | ICD-10-CM | POA: Diagnosis not present

## 2018-10-18 ENCOUNTER — Other Ambulatory Visit: Payer: Self-pay | Admitting: Orthopedic Surgery

## 2018-10-18 DIAGNOSIS — G8929 Other chronic pain: Secondary | ICD-10-CM

## 2018-10-18 DIAGNOSIS — M545 Low back pain, unspecified: Secondary | ICD-10-CM

## 2018-10-19 ENCOUNTER — Other Ambulatory Visit: Payer: Self-pay

## 2018-10-19 ENCOUNTER — Encounter: Payer: Self-pay | Admitting: Adult Health

## 2018-10-19 ENCOUNTER — Telehealth: Payer: Self-pay | Admitting: Pulmonary Disease

## 2018-10-19 ENCOUNTER — Ambulatory Visit (INDEPENDENT_AMBULATORY_CARE_PROVIDER_SITE_OTHER): Payer: Medicare Other | Admitting: Adult Health

## 2018-10-19 VITALS — BP 118/80 | HR 106 | Ht 63.0 in | Wt 152.0 lb

## 2018-10-19 DIAGNOSIS — J020 Streptococcal pharyngitis: Secondary | ICD-10-CM | POA: Diagnosis not present

## 2018-10-19 DIAGNOSIS — B9689 Other specified bacterial agents as the cause of diseases classified elsewhere: Secondary | ICD-10-CM

## 2018-10-19 DIAGNOSIS — J019 Acute sinusitis, unspecified: Secondary | ICD-10-CM

## 2018-10-19 LAB — STREP COMPLETE PANEL
Strep Pyogenes: NEGATIVE
Strep dysgalactiae: NEGATIVE

## 2018-10-19 MED ORDER — CEFDINIR 300 MG PO CAPS: 300.0000 mg | ORAL_CAPSULE | Freq: Two times a day (BID) | ORAL | 0 refills | Status: DC

## 2018-10-19 NOTE — Progress Notes (Signed)
@Patient  ID: Cassidy Olsen, female    DOB: 26-Aug-1960, 58 y.o.   MRN: [id number]  No chief complaint on file.   Referring provider: Leeroy Cha  HPI: 58 yo female never smoker followed for AR and Asthma   TEST/EVENTS :  CXR 04/17/2018: Left sided nodular density   Nuclear medicine PET scan: 05/21/2018: SUV max 2.2 left lower lobe bandlike opacity seen on prior CT imaging.  Much improved the last nodular masslike appearance. 4 mm right ureteropelvic junction calculus, cystic left adnexal mass no metabolic activity.  10/19/2018 Acute OV : Cough  Complains of 4 weeks of cough , sinus congestion , drainage , sinus pain and pressure, sore throat. Took some left over azithromycin initially (4 days worth), claritin, mucinex. Did get some better but cough and sinus congestion never went away . Woke up this morning with sinus pressure, sinus drainage with yellow mucus and sore throat.  Cough is minimal .  No fever, chest pain , orthopnea, edema. 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  No travel .    Seeing ortho for SI  pain .  Has ongoing diarrhea , following with GI.    Allergies  Allergen Reactions  . Aspirin   . Codeine     Cough meds with codeine-have withdraw symptoms when done  . Ibuprofen   . Latex   . Penicillins     Immunization History  Administered Date(s) Administered  . Influenza Inj Mdck Quad Pf 05/24/2017, 05/09/2018    Past Medical History:  Diagnosis Date  . Asthma   . Chronic pain   . Family history of breast cancer   . Family history of prostate cancer   . GERD (gastroesophageal reflux disease)   . High cholesterol   . IBS (irritable bowel syndrome)   . Migraine   . Occipital neuralgia   . Osteoarthritis   . Spinal stenosis   . Trigeminal neuralgia   . Trigeminal neuralgia     Tobacco History: Social History   Tobacco Use  Smoking Status Never Smoker  Smokeless Tobacco Never Used    Counseling given: Not Answered   Outpatient Medications Prior to Visit  Medication Sig Dispense Refill  . amitriptyline (ELAVIL) 50 MG tablet Take 50 mg by mouth at bedtime.    . baclofen (LIORESAL) 10 MG tablet Take 10 mg by mouth daily.     . budesonide-formoterol (SYMBICORT) 80-4.5 MCG/ACT inhaler Inhale 2 puffs into the lungs every 12 (twelve) hours. 1 Inhaler 5  . celecoxib (CELEBREX) 200 MG capsule Take 200 mg by mouth daily.     Marland Kitchen dexlansoprazole (DEXILANT) 60 MG capsule Take 60 mg by mouth daily.    Marland Kitchen dextromethorphan (DELSYM) 30 MG/5ML liquid Take 30 mg by mouth as needed for cough.    . dextromethorphan-guaiFENesin (TUSSIN DM) 10-100 MG/5ML liquid Take 5 mLs by mouth every 4 (four) hours as needed for cough. 180 mL 1  . diphenhydrAMINE (BENADRYL ALLERGY) 25 MG tablet Take 2 tablets (50 mg total) by mouth every 8 (eight) hours as needed. 10 tablet 0  . doxycycline (VIBRA-TABS) 100 MG tablet Take 1 tablet (100 mg total) by mouth 2 (two) times daily. 14 tablet 0  . fenofibrate (TRICOR) 145 MG tablet Take by mouth.    . gabapentin (NEURONTIN) 300 MG capsule Take 300 mg by mouth 3 (three) times daily.    Marland Kitchen lubiprostone (AMITIZA) 24 MCG capsule Take 24 mcg by mouth 2 (two) times  daily with a meal.    . Melatonin 10 MG TABS Take by mouth.    . montelukast (SINGULAIR) 10 MG tablet Take 1 tablet (10 mg total) by mouth at bedtime. 30 tablet 11  . Simethicone 180 MG CAPS Take 1 capsule (180 mg total) by mouth 2 (two) times daily. 30 capsule 0  . Spacer/Aero-Holding Chambers (AEROCHAMBER MV) inhaler Use as instructed 1 each 0  . tapentadol (NUCYNTA ER) 100 MG 12 hr tablet Take 100 mg by mouth every 12 (twelve) hours.    . topiramate (TOPAMAX) 100 MG tablet Take 100 mg by mouth 2 (two) times daily.    . valACYclovir (VALTREX) 500 MG tablet Take 500 mg by mouth daily.    . predniSONE (DELTASONE) 10 MG tablet Take 2 tabs x 5 days 10 tablet 0  . benzonatate (TESSALON) 100 MG capsule Take 100 mg  by mouth 3 (three) times daily as needed for cough.     No facility-administered medications prior to visit.      Review of Systems:   Constitutional:   No  weight loss, night sweats,  Fevers, chills, fatigue, or  lassitude.  HEENT:   No headaches,  Difficulty swallowing,  Tooth/dental problems, or   +Sore throat,                No sneezing, itching, ear ache,  +nasal congestion, post nasal drip,   CV:  No chest pain,  Orthopnea, PND, swelling in lower extremities, anasarca, dizziness, palpitations, syncope.   GI  No heartburn, indigestion, abdominal pain, nausea, vomiting, diarrhea, change in bowel habits, loss of appetite, bloody stools.   Resp: No shortness of breath with exertion or at rest.  No excess mucus, no productive cough,  No non-productive cough,  No coughing up of blood.  No change in color of mucus.  No wheezing.  No chest wall deformity  Skin: no rash or lesions.  GU: no dysuria, change in color of urine, no urgency or frequency.  No flank pain, no hematuria   MS:  No joint pain or swelling.  No decreased range of motion.  +back pain.    Physical Exam  BP 118/80 (BP Location: Left Arm, Cuff Size: Normal)   Pulse (!) 106   Ht 5\' 3"  (1.6 m)   Wt 152 lb (68.9 kg)   SpO2 100%   BMI 26.93 kg/m   GEN: A/Ox3; pleasant , NAD, well nourished    HEENT:  [place]/AT,  EACs-clear, TMs-wnl, NOSE-clear drainage , max tenderness  THROAT-clear, no lesions, no postnasal drip or exudate noted.   NECK:  Supple w/ fair ROM; no JVD; normal carotid impulses w/o bruits; no thyromegaly or nodules palpated; no lymphadenopathy.    RESP  Clear  P & A; w/o, wheezes/ rales/ or rhonchi. no accessory muscle use, no dullness to percussion  CARD:  RRR, no m/r/g, no peripheral edema, pulses intact, no cyanosis or clubbing.  GI:   Soft & nt; nml bowel sounds; no organomegaly or masses detected.   Musco: Warm bil, no deformities or joint swelling noted.   Neuro: alert, no focal deficits  noted.    Skin: Warm, no lesions or rashes    Lab Results:  CBC    Component Value Date/Time   WBC 8.1 04/18/2018 1412   RBC 3.96 04/18/2018 1412   HGB 12.3 04/18/2018 1412   HCT 36.5 04/18/2018 1412   PLT 429 (H) 04/18/2018 1412   MCV 92.2 04/18/2018 1412   MCH  31.1 04/18/2018 1412   MCHC 33.7 04/18/2018 1412   RDW 13.2 04/18/2018 1412   LYMPHSABS 3,653 04/18/2018 1412   MONOABS 0.6 11/18/2016 2303   EOSABS 211 04/18/2018 1412   BASOSABS 73 04/18/2018 1412    BMET No results found for: NA, K, CL, CO2, GLUCOSE, BUN, CREATININE, CALCIUM, GFRNONAA, GFRAA  BNP No results found for: BNP  ProBNP No results found for: PROBNP  Imaging: No results found.    No flowsheet data found.  No results found for: NITRICOXIDE      Assessment & Plan:   No problem-specific Assessment & Plan notes found for this encounter.     Rexene Edison, NP 10/19/2018

## 2018-10-19 NOTE — Telephone Encounter (Signed)
Spoke with pt, she states she had a cold and felt better after awhile but a lingering tickle in her throat. She was coughing with white mucus. I offered her an appt to see a NP today and she agreed. I made an appt with TP this afternoon at 2:30. Nothing further is needed.

## 2018-10-19 NOTE — Assessment & Plan Note (Addendum)
Flare  Strep test neg   Plan  Patient Instructions  Omnicef 300mg  Twice daily  For 10 days -take with food  Take probiotic daily .  Mucinex DM Twice daily  For cough As needed   Saline nasal rinses As needed   Flonase 2 puffs daily As needed   Fluids and rest  Strep test .  Symbicort 2 puffs Twice daily   Albuterol As needed  Wheezing  Follow up with Icard or Shandon Matson NP in 6-8 weeks and As needed   Please contact office for sooner follow up if symptoms do not improve or worsen or seek emergency care

## 2018-10-19 NOTE — Patient Instructions (Addendum)
Omnicef 300mg  Twice daily  For 10 days -take with food  Take probiotic daily .  Mucinex DM Twice daily  For cough As needed   Saline nasal rinses As needed   Flonase 2 puffs daily As needed   Fluids and rest  Strep test .  Symbicort 2 puffs Twice daily   Albuterol As needed  Wheezing  Follow up with Icard or Parrett NP in 6-8 weeks and As needed   Please contact office for sooner follow up if symptoms do not improve or worsen or seek emergency care

## 2018-10-23 ENCOUNTER — Telehealth: Payer: Self-pay

## 2018-10-23 NOTE — Telephone Encounter (Signed)
Spoke with patient. She stated that she wants to volunteer with her church and help prepare food for the homeless. She will not be in direct contact with the homeless, just helping to prepare meals in the kitchen.    She currently does not have a fever, has not been around anyone who has travelled recently. She feels fine. She will wear a mask and gloves while preparing food.   She wants to know if TP would suggest that she continue on with this plan.   TP, please advise. Thanks!

## 2018-10-23 NOTE — Telephone Encounter (Signed)
Patient called office stating, she was recently by TP and she would like her recommendations/clearance to be able to go and help with the homeless. She states she will not be in direct contact and she will wear gloves and mask but she wanted to let TP know and get her opinion.   Call back # [phone]  TP please advise.

## 2018-10-23 NOTE — Progress Notes (Signed)
PCCM: Thanks for seeing.  Garner Nash, DO [facility] 10/23/2018 3:27 PM

## 2018-10-23 NOTE — Telephone Encounter (Signed)
Called and spoke with pt she is aware and verbalized understanding. Nothing further needed.

## 2018-10-23 NOTE — Telephone Encounter (Signed)
Would recommend against this right now with recent illness and is on antibiotics  Also higher risk with underlying asthma so might want to avoid contact with higher risk population .

## 2018-10-26 ENCOUNTER — Other Ambulatory Visit: Payer: Medicare Other

## 2018-10-26 ENCOUNTER — Inpatient Hospital Stay: Admission: RE | Admit: 2018-10-26 | Payer: Medicare Other | Source: Ambulatory Visit

## 2018-10-29 DIAGNOSIS — M6281 Muscle weakness (generalized): Secondary | ICD-10-CM | POA: Diagnosis not present

## 2018-10-29 DIAGNOSIS — M542 Cervicalgia: Secondary | ICD-10-CM | POA: Diagnosis not present

## 2018-10-29 DIAGNOSIS — M25511 Pain in right shoulder: Secondary | ICD-10-CM | POA: Diagnosis not present

## 2018-10-29 DIAGNOSIS — M25562 Pain in left knee: Secondary | ICD-10-CM | POA: Diagnosis not present

## 2018-10-29 DIAGNOSIS — G894 Chronic pain syndrome: Secondary | ICD-10-CM | POA: Diagnosis not present

## 2018-10-29 DIAGNOSIS — M545 Low back pain: Secondary | ICD-10-CM | POA: Diagnosis not present

## 2018-10-30 ENCOUNTER — Encounter: Payer: Self-pay | Admitting: Family Medicine

## 2018-10-30 ENCOUNTER — Telehealth: Payer: Self-pay | Admitting: *Deleted

## 2018-10-30 ENCOUNTER — Telehealth: Payer: Self-pay | Admitting: Family Medicine

## 2018-10-30 ENCOUNTER — Telehealth: Payer: Self-pay | Admitting: Neurology

## 2018-10-30 MED ORDER — GABAPENTIN 300 MG PO CAPS: 300.0000 mg | ORAL_CAPSULE | Freq: Three times a day (TID) | ORAL | 11 refills | Status: DC

## 2018-10-30 NOTE — Telephone Encounter (Addendum)
I spoke with the patient and discussed that Dr. Jaynee Eagles advises she contact the facility who performed the dry needling. Patient felt like this would not help. She said that the dry needling was completed for her occipital neuralgia 10 days ago and she doesn't feel like it is helping. She said that yesterday she had dry needling done for her shoulder therapy however after treatment she had pain in her head and they were not near that area, only doing the dry needling in her shoulder/scapula area. She said her head is hurting so bad and she needs relief. I advised that we are limiting office visits d/t the covid 19 pandemic. I advised that I would discuss her concerns with Dr. Jaynee Eagles and would call her back. Pt verbalized understanding.

## 2018-10-30 NOTE — Telephone Encounter (Signed)
Make sure no difficulty swallowing or dysphagia , or swelling .   Sorry to hear that  If still has sore throat , and meds are not working  Could be residual from URI but if persists will need referral to ENT  Would cont to try to salt water gargles , zyrtec 10mg  At bedtime  As needed  Drainage for couple of weeks if not better will place referral  If worse sooner follow up   Please contact office for sooner follow up if symptoms do not improve or worsen or seek emergency care    Trying to avoid exposure of COVID , but if needs ov will bring into office or refer to ENT

## 2018-10-30 NOTE — Telephone Encounter (Signed)
Ask Amy if she wouldn't mind doing a phone visit with patient. thanks

## 2018-10-30 NOTE — Telephone Encounter (Signed)
Patient phone today regarding her concerns of occipital pain.  Occipital pain has been worsening over the last month.  She reports that about 10 days ago she was seen by her physical therapist for dry needling.  She reports that this therapy typically helps her occipital pain, however, it did not work as well as usual.  Yesterday she was seen again by physical therapy and had dry needling for a separate concern, shoulder pain.  She reports that her pain has worsened and is now affecting her sleep.  With medication review patient reports that she has had to titrate down off of gabapentin.  Originally she was taken 300 mg 3 times a day.  She has titrated down to 300 mg once a day for the last few months and most recently 300 mg every other day for about a week.  We have discussed limiting inpatient visits due to concerns of corona exposure.  Patient verbalized understanding.  I have suggested that we restart milligrams 3 times a day.  New prescription will be called into [facility] per her request.  She was advised to keep close follow-up with me.  She will call the office in 2 days with a progress report.  If any worsening symptoms were to occur she was advised to call immediately or proceed to the emergency room.  We have reviewed signs and symptoms of stroke.  She has no vision changes, nausea, vomiting, weakness, speech changes or confusion.  She is aware of these urgent concerns and will seek medical attention immediately should they occur.

## 2018-10-30 NOTE — Telephone Encounter (Signed)
I am happy to do a telephone visit with her!

## 2018-10-30 NOTE — Telephone Encounter (Signed)
I called pt and let her know that pain management can do a nerve block this Friday. She was very appreciative and asked that we call [facility] and let them know.   I called [facility] and LVM for nurse asking if they could do an occipital nerve block this Friday @ pt's appt, ok per Dr. Jaynee Eagles. Left office number in message & asked for call back to confirm receipt.

## 2018-10-30 NOTE — Telephone Encounter (Signed)
LMTCB x 1 

## 2018-10-30 NOTE — Telephone Encounter (Signed)
Per Amy, pt has appt with pain management Friday. Per Dr. Jaynee Eagles, pt can have them do an occipital nerve block at that appt.

## 2018-10-30 NOTE — Telephone Encounter (Signed)
Called patient to reschedule 11/07/18 PFT and ov. She saw Tammy Parret on 10/19/18 for sore throat. Pt states she completed Omnicef abx and her throat is still sore. She has been gargling with salt water. Pt would like tele visit to replace 11/07/18 ov.

## 2018-10-30 NOTE — Telephone Encounter (Signed)
Spoke with Dr. Jaynee Eagles. Pt will need to contact the rehab/dry needling facility to address her concerns.

## 2018-10-30 NOTE — Telephone Encounter (Signed)
THANK YOU

## 2018-10-30 NOTE — Telephone Encounter (Signed)
Pt called in and stated she had some dry needling done yesterday and she thinks it may hit a nerve , she wanting to know if she can be seen

## 2018-11-01 ENCOUNTER — Encounter: Payer: Self-pay | Admitting: Neurology

## 2018-11-01 NOTE — Telephone Encounter (Signed)
Letter to pain management written and signed by MD. Faxed to [facility] @ [phone]. Received a receipt of confirmation.

## 2018-11-01 NOTE — Telephone Encounter (Addendum)
Called [facility]. They are happy to provide this service (occipital nerve block) but will need a statement of request from provider.   Fax request to [phone]  Attn: Olin Hauser  Dr. Jaynee Eagles aware and will write statement of request.

## 2018-11-02 DIAGNOSIS — M5481 Occipital neuralgia: Secondary | ICD-10-CM | POA: Diagnosis not present

## 2018-11-02 DIAGNOSIS — G894 Chronic pain syndrome: Secondary | ICD-10-CM | POA: Diagnosis not present

## 2018-11-02 DIAGNOSIS — M47816 Spondylosis without myelopathy or radiculopathy, lumbar region: Secondary | ICD-10-CM | POA: Diagnosis not present

## 2018-11-02 DIAGNOSIS — Z79891 Long term (current) use of opiate analgesic: Secondary | ICD-10-CM | POA: Diagnosis not present

## 2018-11-02 DIAGNOSIS — M25519 Pain in unspecified shoulder: Secondary | ICD-10-CM | POA: Diagnosis not present

## 2018-11-02 DIAGNOSIS — Z79899 Other long term (current) drug therapy: Secondary | ICD-10-CM | POA: Diagnosis not present

## 2018-11-05 DIAGNOSIS — M545 Low back pain: Secondary | ICD-10-CM | POA: Diagnosis not present

## 2018-11-05 DIAGNOSIS — G894 Chronic pain syndrome: Secondary | ICD-10-CM | POA: Diagnosis not present

## 2018-11-05 DIAGNOSIS — M542 Cervicalgia: Secondary | ICD-10-CM | POA: Diagnosis not present

## 2018-11-05 DIAGNOSIS — M25511 Pain in right shoulder: Secondary | ICD-10-CM | POA: Diagnosis not present

## 2018-11-05 DIAGNOSIS — M25562 Pain in left knee: Secondary | ICD-10-CM | POA: Diagnosis not present

## 2018-11-05 DIAGNOSIS — M6281 Muscle weakness (generalized): Secondary | ICD-10-CM | POA: Diagnosis not present

## 2018-11-05 MED ORDER — NYSTATIN 100000 UNIT/ML MT SUSP: 5.0000 mL | Freq: Three times a day (TID) | OROMUCOSAL | 0 refills | Status: DC

## 2018-11-05 MED ORDER — PREDNISONE 10 MG PO TABS: ORAL_TABLET | ORAL | 0 refills | Status: DC

## 2018-11-05 MED ORDER — MAGIC MOUTHWASH: 5.0000 mL | Freq: Three times a day (TID) | ORAL | 0 refills | Status: DC

## 2018-11-05 NOTE — Telephone Encounter (Signed)
I called patient to cancel appointment with Dr. Valeta Harms on 11/07/2018 - pt states that she still hasn't back regarding a possible televisit or ov with an APP. Patient would like to have a call back today -she can be reached at [phone]

## 2018-11-05 NOTE — Telephone Encounter (Signed)
Spoke with patient  She is better from URI /Bronchitis , however throat remains irritated , hoarseness and sore on off. Salt water gargles are helping but only briefly.  No fever, chest pain or dysphagia.   Discussed voice rest , GERD diet .  Continue on Zyrtec and Dexilant   Begin Prednisone taper over next week.  Delsym 2 tsp Twice daily for 1 week. Then As needed   Honey As needed   Majic Mouthwash x 1 week   Please contact office for sooner follow up if symptoms do not improve or worsen or seek emergency care

## 2018-11-05 NOTE — Addendum Note (Signed)
Addended by: Valerie Salts on: 11/05/2018 05:19 PM   Modules accepted: Orders

## 2018-11-05 NOTE — Addendum Note (Signed)
Addended by: Valerie Salts on: 11/05/2018 05:09 PM   Modules accepted: Orders

## 2018-11-05 NOTE — Telephone Encounter (Signed)
Will route to TP.

## 2018-11-05 NOTE — Telephone Encounter (Signed)
Patient states that she is doing all of this and is no better at this time would like to come in which was okay per tp in note. Nothing further is needed at this time.

## 2018-11-05 NOTE — Addendum Note (Signed)
Addended by: Melvenia Needles on: 11/05/2018 05:04 PM   Modules accepted: Orders

## 2018-11-06 ENCOUNTER — Ambulatory Visit: Payer: Medicare Other | Admitting: Adult Health

## 2018-11-07 ENCOUNTER — Ambulatory Visit: Payer: Medicare Other | Admitting: Adult Health

## 2018-11-07 ENCOUNTER — Ambulatory Visit: Payer: Medicare Other | Admitting: Pulmonary Disease

## 2018-11-08 DIAGNOSIS — M5481 Occipital neuralgia: Secondary | ICD-10-CM | POA: Diagnosis not present

## 2018-11-12 ENCOUNTER — Other Ambulatory Visit: Payer: Self-pay

## 2018-11-12 ENCOUNTER — Encounter: Payer: Self-pay | Admitting: Internal Medicine

## 2018-11-12 ENCOUNTER — Ambulatory Visit (INDEPENDENT_AMBULATORY_CARE_PROVIDER_SITE_OTHER): Payer: Medicare Other | Admitting: Internal Medicine

## 2018-11-12 ENCOUNTER — Telehealth: Payer: Self-pay | Admitting: Adult Health

## 2018-11-12 VITALS — BP 104/64 | HR 77 | Temp 98.5°F | Ht 63.0 in | Wt 150.6 lb

## 2018-11-12 DIAGNOSIS — M542 Cervicalgia: Secondary | ICD-10-CM | POA: Diagnosis not present

## 2018-11-12 DIAGNOSIS — M6281 Muscle weakness (generalized): Secondary | ICD-10-CM | POA: Diagnosis not present

## 2018-11-12 DIAGNOSIS — R05 Cough: Secondary | ICD-10-CM

## 2018-11-12 DIAGNOSIS — G894 Chronic pain syndrome: Secondary | ICD-10-CM | POA: Diagnosis not present

## 2018-11-12 DIAGNOSIS — M25511 Pain in right shoulder: Secondary | ICD-10-CM | POA: Diagnosis not present

## 2018-11-12 DIAGNOSIS — J4520 Mild intermittent asthma, uncomplicated: Secondary | ICD-10-CM

## 2018-11-12 DIAGNOSIS — M25562 Pain in left knee: Secondary | ICD-10-CM | POA: Diagnosis not present

## 2018-11-12 DIAGNOSIS — M545 Low back pain: Secondary | ICD-10-CM | POA: Diagnosis not present

## 2018-11-12 DIAGNOSIS — R058 Other specified cough: Secondary | ICD-10-CM

## 2018-11-12 NOTE — Assessment & Plan Note (Signed)
Onset of resp problems all started in pt's 60's while on ACEi > d/c around 2014  - 11/12/2018 max rx for gerd/ cyclical cough   Upper airway cough syndrome (previously labeled PNDS),  is so named because it's frequently impossible to sort out how much is  CR/sinusitis with freq throat clearing (which can be related to primary GERD)   vs  causing  secondary (" extra esophageal")  GERD from wide swings in gastric pressure that occur with throat clearing, often  promoting self use of mint and menthol lozenges that reduce the lower esophageal sphincter tone and exacerbate the problem further in a cyclical fashion.   These are the same pts (now being labeled as having "irritable larynx syndrome" by some cough centers) who not infrequently have a history of having failed to tolerate ace inhibitors,  dry powder inhalers (and sometimes any ICS including low dose Symbicort hfa)  or biphosphonates or report having atypical/extraesophageal reflux symptoms that don't respond to standard doses of PPI  and are easily confused as having aecopd or asthma flares by even experienced allergists/ pulmonologists (myself included).    Of the three most common causes of  Sub-acute / recurrent or chronic cough, only one (GERD)  can actually contribute to/ trigger  the other two (asthma and post nasal drip syndrome)  and perpetuate the cylce of cough.  While not intuitively obvious, many patients with chronic low grade reflux do not cough until there is a primary insult that disturbs the protective epithelial barrier and exposes sensitive nerve endings.   This is typically viral but can due to PNDS and  either may apply here.      >>>  The point is that once this occurs, it is difficult to eliminate the cycle  using anything but a maximally effective acid suppression regimen at least in the short run, accompanied by an appropriate diet to address non acid GERD and control / eliminate the cough itself for at least 3 days with  tramadol if needed to stop all throat clearing and try off ics (see asthma)    If not improved on above rx next step is sinus ct/ cxr and  ENT eval.

## 2018-11-12 NOTE — Patient Instructions (Addendum)
Symbicort 80 up to 2 pffs every 12 hours for wheezing/ shortness of breathing  Dexilant 60 Take  30-60 min before first meal of the day and Pepcid (famotidine)  20 mg one after supper  until  100% better with no need for any cough medication.   GERD (REFLUX)  is an extremely common cause of respiratory symptoms just like yours , many times with no obvious heartburn at all.    It can be treated with medication, but also with lifestyle changes including elevation of the head of your bed (ideally with 6 -8inch blocks under the headboard of your bed),  Smoking cessation, avoidance of late meals, excessive alcohol, and avoid fatty foods, chocolate, peppermint, colas, red wine, and acidic juices such as orange juice.  NO MINT OR MENTHOL PRODUCTS SO NO COUGH DROPS  USE SUGARLESS CANDY INSTEAD (Jolley ranchers or Stover's or Life Savers) or even ice chips will also do - the key is to swallow to prevent all throat clearing. NO OIL BASED VITAMINS - use powdered substitutes.  Avoid fish oil when coughing.  Take delsym two tsp every 12 hours and supplement if needed with  tramadol 50 mg up to 1-2 every 4 hours to suppress the urge to cough. Swallowing water and/or using ice chips/non mint and menthol containing candies (such as lifesavers or sugarless jolly ranchers) are also effective.  You should rest your voice and avoid activities that you know make you cough.  Once you have eliminated the cough for 3 straight days try reducing the tramadol first,  then the delsym as tolerated.    If not improving over the next week we need to have you seen by an ENT.  Add needs ct sinus/ cxr also if not better

## 2018-11-12 NOTE — Telephone Encounter (Signed)
Primary Pulmonologist: [facility] Last office visit and with whom: 10/19/2018 with TP What do we see them for (pulmonary problems): abn findings on imaging/asthma Last OV assessment/plan: Instructions  Return in about 6 weeks (around 11/30/2018).  Omnicef 300mg  Twice daily  For 10 days -take with food  Take probiotic daily .  Mucinex DM Twice daily  For cough As needed   Saline nasal rinses As needed   Flonase 2 puffs daily As needed   Fluids and rest  Strep test .  Symbicort 2 puffs Twice daily   Albuterol As needed  Wheezing  Follow up with [facility] or Parrett NP in 6-8 weeks and As needed   Please contact office for sooner follow up if symptoms do not improve or worsen or seek emergency care        Was appointment offered to patient (explain)?  Pt wants to know recs to help with symptoms   Reason for call: Called and spoke with pt who states she has finished almost all of the nystatin that was prescribed. Pt states her throat is really hurting and she is also spitting up a little bit of blood. Pt also states the back of her tongue is burning and feels like it is on fire.  Pt stated symptoms worsened 2 days ago but pt stated the spitting up blood began yesterday. Pt stated the blood is bright red blood.   Pt denies any fever, and denies any recent travel. Pt wants to know recs to help with symptoms. Tammy, please advise. Thanks!

## 2018-11-12 NOTE — Assessment & Plan Note (Addendum)
Hx asthma in her 46's while on acei Complains of wheezing and cough 04/18/18 IgE 47, neg rast on  singulair   07/27/18 FENO 13   Not clear how much chronic asthma is present in this pt with impressive component of UACS by hx and exam.  If she does have asthma I think it's mild.  Based on two studies from NEJM  378; 20 p 1865 (2018) and 380 : p2020-30 (2019) in pts with mild asthma it is reasonable to use low dose symbicort eg 80 2bid "prn" flare in this setting but I emphasized this was only shown with symbicort and takes advantage of the rapid onset of action but is not the same as "rescue therapy" but can be stopped once the acute symptoms have resolved and the need for rescue has been minimized (< 2 x weekly)    Reducing symbicort 80 may actually help with her symptoms and if not can always increase back to 2bid.  - The proper method of use, as well as anticipated side effects, of a metered-dose inhaler are discussed and demonstrated to the patient.    Will plan ov in person when COVID - 19 restrictions have been lifted - in meantime can do televisits    Total time devoted to counseling  > 50 % of initial 60 min office visit:  review case with pt/  device teaching which extended face to face time for this visit discussion of options/alternatives/ personally creating written customized instructions  in presence of pt  then going over those specific  Instructions directly with the pt including how to use all of the meds but in particular covering each new medication in detail and the difference between the maintenance= "automatic" meds and the prns using an action plan format for the latter (If this problem/symptom => do that organization reading Left to right).  Please see AVS from this visit for a full list of these instructions which I personally wrote for this pt and  are unique to this visit.

## 2018-11-12 NOTE — Progress Notes (Addendum)
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  s resp problems in  2007 rx  lyrica gained wt>> hbp >> rx   lisnopril then onset of wheezing around 2010  With dx "adult onset asthma" while in [place] and placed on symbicort at that point helped some but continued to have severe "bronchitis"  But eventually stopped lyrica, lost wt, then stopped the lisinopril around 2014  and did much better s need for daily symbicort but then starting around 2016/17 with bad head/chest colds complicated by Pna aug 2019 seen in by Cassidy Olsen 04/18/18 he rec  singulair and symbicort 80 as maint  and rx prn albuterol then onset "bad cold" tx giving 2019 eval by Cassidy Olsen  rec Prednisone 20mg  x 5 days Doxycyline 1 tab twice daily x 7 days (take probiotic with abx)  Continue mucinex twice daily Continue Delsym cough syrup twice daily Return if symptoms do not improve in 7-10 days > improved    Baseline 300 mg tid but starting early march 2019  started weaning down to zero but not sure of last dose    10/19/2018 acute ov /NP  Complains of 4 weeks of cough , sinus congestion , drainage , sinus pain and pressure, sore throat. Took some left over azithromycin initially (4 days worth), claritin, mucinex. Did get some better but cough and sinus congestion never went away . Woke up this morning with sinus pressure, sinus drainage with yellow mucus and sore throat.  Cough is minimal .  No fever, chest pain , orthopnea, edema. Was exposed to sick contact at work initially with Cassidy Olsen with strep throat.  No travel  rec Omnicef 300mg  Twice daily  For 10 days -take with food  Take probiotic daily .  Mucinex DM Twice daily  For cough As needed   Saline nasal rinses As needed   Flonase 2 puffs daily As needed   Fluids and rest  Strep test  > neg  Symbicort 2 puffs Twice daily   Albuterol As needed   Wheezing     11/05/2018 PC with TP Spoke with patient  She is better from URI /Bronchitis , however throat remains irritated , hoarseness and sore on off. Salt water gargles are helping but only briefly.  No fever, chest pain or dysphagia.  Discussed voice rest , GERD diet .  Continue on Zyrtec and Dexilant  Begin Prednisone taper over next week.  Delsym 2 tsp Twice daily for 1 week. Then As needed   Honey As needed   Majic Mouthwash x 1 week    11/12/2018  2nd opinion/ ov/Cassidy Olsen re: refractory cough on gabapentin 300 three restarted x  5 days prior to OV -  maint on symbicort 80 2bid and singulair s sob / wheeze Chief Complaint  Patient presents with   Acute Visit    Pt c/o sore throat. She states she feels like there is phlegm in her throat and her tongue is burning. She states when she brushes her teeth she spits out blood.   Main  problem now =  Throat burning 24/7 some better p gargles/ no odynophagia Classic voice fatigue/ raspiness.  Breathing fine  No fever wakes up with worst sore throat and dryness each am but no mucus / sleeps on side / only notes "spitting up blood"(min vol) when she  brushes teeth which makes her gag violently with vigorous throat ("I've done that every day for years, I thought that was how I was supposed to clear out the back of my throat")   No obvious day to day or daytime variability or assoc excess/ purulent sputum or mucus plugs or   cp or chest tightness, subjective wheeze or overt sinus or hb symptoms.   Sleeping without nocturnal   exacerbation  of respiratory  c/o's or need for noct saba. Also denies any obvious fluctuation of symptoms with weather or environmental changes or other aggravating or alleviating factors except as outlined above   No unusual exposure hx or h/o childhood pna/ asthma or knowledge of premature birth.  Current Allergies, Complete Past Medical History, Past Surgical History, Family History, and Social History were reviewed  in [facility] record.  ROS  The following are not active complaints unless bolded Hoarseness, sore throat, dysphagia, dental problems, itching, sneezing,  nasal congestion or discharge of excess mucus or purulent secretions, ear ache,   fever, chills, sweats, unintended wt loss or wt gain, classically pleuritic or exertional cp,  orthopnea pnd or arm/hand swelling  or leg swelling, presyncope, palpitations, abdominal pain, anorexia, nausea, vomiting, diarrhea  or change in bowel habits or change in bladder habits, change in stools or change in urine, dysuria, hematuria,  rash, arthralgias, visual complaints, headache, numbness, weakness or ataxia or problems with walking or coordination,  change in mood or  memory.        Current Meds- - NOTE:   Unable to verify as accurately reflecting what pt takes     Medication Sig   amitriptyline (ELAVIL) 50 MG tablet Take 50 mg by mouth at bedtime.   baclofen (LIORESAL) 10 MG tablet Take 10 mg by mouth daily.    benzonatate (TESSALON) 100 MG capsule Take 100 mg by mouth 3 (three) times daily as needed for cough.   budesonide-formoterol (SYMBICORT) 80-4.5 MCG/ACT inhaler Inhale 2 puffs into the lungs every 12 (twelve) hours.   celecoxib (CELEBREX) 200 MG capsule Take 200 mg by mouth daily.    dexlansoprazole (DEXILANT) 60 MG capsule Take 60 mg by mouth daily.   dextromethorphan (DELSYM) 30 MG/5ML liquid Take 30 mg by mouth as needed for cough.   diphenhydrAMINE (BENADRYL ALLERGY) 25 MG tablet Take 2 tablets (50 mg total) by mouth every 8 (eight) hours as needed.   fenofibrate (TRICOR) 145 MG tablet Take by mouth.   gabapentin (NEURONTIN) 300 MG capsule Take 1 capsule (300 mg total) by mouth 3 (three) times daily.   lubiprostone (AMITIZA) 24 MCG capsule Take 24 mcg by mouth 2 (two) times daily with a meal.   Melatonin 10 MG TABS Take by mouth.   montelukast (SINGULAIR) 10 MG tablet Take 1 tablet (10 mg total) by mouth at  bedtime.   nystatin (MYCOSTATIN) 100000 UNIT/ML suspension Take 5 mLs (500,000 Units total) by mouth 3 (three) times daily.   Simethicone 180 MG CAPS Take 1 capsule (180 mg total) by mouth 2 (two) times daily.   Spacer/Aero-Holding Chambers (AEROCHAMBER MV) inhaler Use as instructed   tapentadol (NUCYNTA ER) 100 MG 12 hr tablet Take 100 mg by mouth every 12 (twelve) hours.   topiramate (TOPAMAX) 100 MG tablet Take 100 mg by mouth 2 (two) times daily.   valACYclovir (VALTREX) 500 MG tablet Take 500 mg by mouth daily.            Objective:     BP  104/64 (BP Location: Left Arm, Cuff Size: Normal)    Pulse 77    Temp 98.5 F (36.9 C) (Oral)    Ht 5\' 3"  (1.6 m)    Wt 150 lb 9.6 oz (68.3 kg)    SpO2 98%    BMI 26.68 kg/m   SpO2: 98 %  RA  amb bf with classic voice fatigue/ freq throat clearing with grinding/ hocking  HEENT: nl dentition, turbinates bilaterally, and oropharynx. Nl external ear canals without cough reflex   NECK :  without JVD/Nodes/TM/ nl carotid upstrokes bilaterally   LUNGS: no acc muscle use,  Nl contour chest which is clear to A and P bilaterally without cough on insp or exp maneuvers   CV:  RRR  no s3 or murmur or increase in P2, and no edema   ABD:  soft and nontender with nl inspiratory excursion in the supine position. No bruits or organomegaly appreciated, bowel sounds nl  MS:  Nl gait/ ext warm without deformities, calf tenderness, cyanosis or clubbing No obvious joint restrictions   SKIN: warm and dry without lesions    NEURO:  alert, approp, nl sensorium with  no motor or cerebellar deficits apparent.       Assessment   Upper airway cough syndrome Onset of resp problems all started in pt's 40's while on ACEi > d/c around 2014  - 11/12/2018 max rx for gerd/ cyclical cough   Upper airway cough syndrome (previously labeled PNDS),  is so named because it's frequently impossible to sort out how much is  CR/sinusitis with freq throat clearing  (which can be related to primary GERD)   vs  causing  secondary (" extra esophageal")  GERD from wide swings in gastric pressure that occur with throat clearing, often  promoting self use of mint and menthol lozenges that reduce the lower esophageal sphincter tone and exacerbate the problem further in a cyclical fashion.   These are the same pts (now being labeled as having "irritable larynx syndrome" by some cough centers) who not infrequently have a history of having failed to tolerate ace inhibitors,  dry powder inhalers (and sometimes any ICS including low dose Symbicort hfa)  or biphosphonates or report having atypical/extraesophageal reflux symptoms that don't respond to standard doses of PPI  and are easily confused as having aecopd or asthma flares by even experienced allergists/ pulmonologists (myself included).    Of the three most common causes of  Sub-acute / recurrent or chronic cough, only one (GERD)  can actually contribute to/ trigger  the other two (asthma and post nasal drip syndrome)  and perpetuate the cylce of cough.  While not intuitively obvious, many patients with chronic low grade reflux do not cough until there is a primary insult that disturbs the protective epithelial barrier and exposes sensitive nerve endings.   This is typically viral but can due to PNDS and  either may apply here.      >>>  The point is that once this occurs, it is difficult to eliminate the cycle  using anything but a maximally effective acid suppression regimen at least in the short run, accompanied by an appropriate diet to address non acid GERD and control / eliminate the cough itself for at least 3 days with tramadol if needed to stop all throat clearing and try off ics (see asthma)    If not improved on above rx next step is sinus ct/ cxr and  ENT eval.  Asthma Hx asthma in her 57's while on acei Complains of wheezing and cough 04/18/18 IgE 47, neg rast on  singulair   07/27/18 FENO 13     Not clear how much chronic asthma is present in this pt with impressive component of UACS by hx and exam.  If she does have asthma I think it's mild.  Based on two studies from NEJM  378; 20 p 1865 (2018) and 380 : p2020-30 (2019) in pts with mild asthma it is reasonable to use low dose symbicort eg 80 2bid "prn" flare in this setting but I emphasized this was only shown with symbicort and takes advantage of the rapid onset of action but is not the same as "rescue therapy" but can be stopped once the acute symptoms have resolved and the need for rescue has been minimized (< 2 x weekly)    Reducing symbicort 80 may actually help with her symptoms and if not can always increase back to 2bid.  - The proper method of use, as well as anticipated side effects, of a metered-dose inhaler are discussed and demonstrated to the patient   Will plan ov in person when COVID - 19 restrictions have been lifted - in meantime can do televisits       Total time devoted to counseling  > 50 % of initial 60 min office visit:  review case with pt/ device teaching which extended face to face time for this visit  discussion of options/alternatives/ personally creating written customized instructions  in presence of pt  then going over those specific  Instructions directly with the pt including how to use all of the meds but in particular covering each new medication in detail and the difference between the maintenance= "automatic" meds and the prns using an action plan format for the latter (If this problem/symptom => do that organization reading Left to right).  Please see AVS from this visit for a full list of these instructions which I personally wrote for this pt and  are unique to this visit.         Christinia Gully, MD 11/12/2018

## 2018-11-12 NOTE — Telephone Encounter (Signed)
Per TP and MW, patient will need to be seen in the office. Called patient and she has been scheduled to see MW this afternoon at 200pm. Patient verbalized understanding. Nothing further needed at time of call.

## 2018-11-23 ENCOUNTER — Ambulatory Visit: Payer: Medicare Other

## 2018-11-26 ENCOUNTER — Other Ambulatory Visit: Payer: Medicare Other

## 2018-11-27 DIAGNOSIS — M545 Low back pain: Secondary | ICD-10-CM | POA: Diagnosis not present

## 2018-11-27 DIAGNOSIS — M542 Cervicalgia: Secondary | ICD-10-CM | POA: Diagnosis not present

## 2018-11-27 DIAGNOSIS — G894 Chronic pain syndrome: Secondary | ICD-10-CM | POA: Diagnosis not present

## 2018-11-27 DIAGNOSIS — M6281 Muscle weakness (generalized): Secondary | ICD-10-CM | POA: Diagnosis not present

## 2018-11-27 DIAGNOSIS — M25562 Pain in left knee: Secondary | ICD-10-CM | POA: Diagnosis not present

## 2018-11-27 DIAGNOSIS — M25511 Pain in right shoulder: Secondary | ICD-10-CM | POA: Diagnosis not present

## 2018-11-28 DIAGNOSIS — F4325 Adjustment disorder with mixed disturbance of emotions and conduct: Secondary | ICD-10-CM | POA: Diagnosis not present

## 2018-11-29 DIAGNOSIS — G894 Chronic pain syndrome: Secondary | ICD-10-CM | POA: Diagnosis not present

## 2018-11-29 DIAGNOSIS — J449 Chronic obstructive pulmonary disease, unspecified: Secondary | ICD-10-CM | POA: Diagnosis not present

## 2018-11-29 DIAGNOSIS — N3010 Interstitial cystitis (chronic) without hematuria: Secondary | ICD-10-CM | POA: Diagnosis not present

## 2018-11-29 DIAGNOSIS — E559 Vitamin D deficiency, unspecified: Secondary | ICD-10-CM | POA: Diagnosis not present

## 2018-11-29 DIAGNOSIS — K210 Gastro-esophageal reflux disease with esophagitis: Secondary | ICD-10-CM | POA: Diagnosis not present

## 2018-12-04 DIAGNOSIS — M25511 Pain in right shoulder: Secondary | ICD-10-CM | POA: Diagnosis not present

## 2018-12-04 DIAGNOSIS — G894 Chronic pain syndrome: Secondary | ICD-10-CM | POA: Diagnosis not present

## 2018-12-04 DIAGNOSIS — M25562 Pain in left knee: Secondary | ICD-10-CM | POA: Diagnosis not present

## 2018-12-04 DIAGNOSIS — M542 Cervicalgia: Secondary | ICD-10-CM | POA: Diagnosis not present

## 2018-12-04 DIAGNOSIS — M545 Low back pain: Secondary | ICD-10-CM | POA: Diagnosis not present

## 2018-12-04 DIAGNOSIS — M6281 Muscle weakness (generalized): Secondary | ICD-10-CM | POA: Diagnosis not present

## 2018-12-06 DIAGNOSIS — F4325 Adjustment disorder with mixed disturbance of emotions and conduct: Secondary | ICD-10-CM | POA: Diagnosis not present

## 2018-12-07 DIAGNOSIS — M47816 Spondylosis without myelopathy or radiculopathy, lumbar region: Secondary | ICD-10-CM | POA: Diagnosis not present

## 2018-12-07 DIAGNOSIS — M79673 Pain in unspecified foot: Secondary | ICD-10-CM | POA: Diagnosis not present

## 2018-12-07 DIAGNOSIS — G500 Trigeminal neuralgia: Secondary | ICD-10-CM | POA: Diagnosis not present

## 2018-12-07 DIAGNOSIS — G894 Chronic pain syndrome: Secondary | ICD-10-CM | POA: Diagnosis not present

## 2018-12-11 DIAGNOSIS — M75101 Unspecified rotator cuff tear or rupture of right shoulder, not specified as traumatic: Secondary | ICD-10-CM | POA: Diagnosis not present

## 2018-12-11 DIAGNOSIS — M25511 Pain in right shoulder: Secondary | ICD-10-CM | POA: Diagnosis not present

## 2018-12-11 DIAGNOSIS — M7501 Adhesive capsulitis of right shoulder: Secondary | ICD-10-CM | POA: Diagnosis not present

## 2018-12-12 ENCOUNTER — Other Ambulatory Visit: Payer: Self-pay | Admitting: Internal Medicine

## 2018-12-12 ENCOUNTER — Telehealth: Payer: Self-pay | Admitting: Internal Medicine

## 2018-12-12 DIAGNOSIS — R059 Cough, unspecified: Secondary | ICD-10-CM

## 2018-12-12 DIAGNOSIS — R058 Other specified cough: Secondary | ICD-10-CM

## 2018-12-12 DIAGNOSIS — R05 Cough: Secondary | ICD-10-CM

## 2018-12-12 NOTE — Telephone Encounter (Signed)
11/12/18 Dr. Melvyn Novas Do you want to place ENT referral and if yes where? Pasted from visit: Of the three most common causes of  Sub-acute / recurrent or chronic cough, only one (GERD)  can actually contribute to/ trigger  the other two (asthma and post nasal drip syndrome)  and perpetuate the cylce of cough.  While not intuitively obvious, many patients with chronic low grade reflux do not cough until there is a primary insult that disturbs the protective epithelial barrier and exposes sensitive nerve endings.   This is typically viral but can due to PNDS and  either may apply here.      >>>  The point is that once this occurs, it is difficult to eliminate the cycle  using anything but a maximally effective acid suppression regimen at least in the short run, accompanied by an appropriate diet to address non acid GERD and control / eliminate the cough itself for at least 3 days with tramadol if needed to stop all throat clearing and try off ics (see asthma)    If not improved on above rx next step is sinus ct/ cxr and  ENT eval.

## 2018-12-12 NOTE — Telephone Encounter (Signed)
Yes from a pulmonary perspective they are the same, delsym should help both

## 2018-12-12 NOTE — Progress Notes (Signed)
Order re-entered

## 2018-12-12 NOTE — Telephone Encounter (Signed)
Pt says she was around dog on Sunday and since has started back clearing her throat. She says you classified that as coughing and told her last ov to take delsym. She wants to know if she should go back to delsym?

## 2018-12-12 NOTE — Telephone Encounter (Signed)
It may be a while before they can see her and if not better since last ov   rec   Sinus CT  Dx upper airway cough syndrome Ov with updated cxr and set up ent referral then  (makes not sense to send her until these basic steps are completed)

## 2018-12-12 NOTE — Telephone Encounter (Signed)
Left detailed message for patient. Advised her to call us back if she needed anything.

## 2018-12-13 ENCOUNTER — Telehealth: Payer: Self-pay | Admitting: Internal Medicine

## 2018-12-13 MED ORDER — CLOTRIMAZOLE 10 MG MT TROC: 10.0000 mg | Freq: Four times a day (QID) | OROMUCOSAL | 0 refills | Status: DC

## 2018-12-13 MED ORDER — TRAMADOL HCL 50 MG PO TABS: 50.0000 mg | ORAL_TABLET | ORAL | 0 refills | Status: DC | PRN

## 2018-12-13 NOTE — Telephone Encounter (Signed)
Ok to try clotrimazole troche 10 mg qid x 16 no refills   Remind her that the throat pain and cough are likely to be related to one another and have already been addressed in her AVS:  "Take delsym two tsp every 12 hours and supplement if needed with  tramadol 50 mg up to 2 every 4 hours to suppress the urge to cough. Swallowing water and/or using ice chips/non mint and menthol containing candies (such as lifesavers or sugarless jolly ranchers) are also effective.  You should rest your voice and avoid activities that you know make you cough.  Once you have eliminated the cough for 3 straight days try reducing the tramadol first,  then the delsym as tolerated. "   If she is already tried these and they are not working and we need to see her here in the office before the weekend.

## 2018-12-13 NOTE — Telephone Encounter (Signed)
Called and spoke with pt letting her know the info stated by MW and that we were going to send Rx of clotrimazole to pharmacy for her and pt verbalized understanding. I verified pt's preferred pharmacy and sent Rx in for pt.  I also stated the other info to pt in regards to info from AVS. While speaking with pt, pt stated that she had told MW that she still had some tramadol left over at home from a previous time so no Rx was sent to pharmacy but pt is now stating that she is needing an Rx to be sent to pharmacy.  Dr. Melvyn Novas, please advise if you are okay with Tramadol Rx being sent to pt's pharmacy of [place] off [facility]. Thanks!

## 2018-12-13 NOTE — Telephone Encounter (Signed)
Please let her know I refilled

## 2018-12-13 NOTE — Telephone Encounter (Signed)
Dr. Melvyn Novas pt calling again today. She is c/o of cough and tongue burning. On Sunday she was around a dog, she used her Symbicort in which she has not been using regularly. She did rinse well after use. She says this happened before but allergy panel showed now allergy to dander.  [facility] called pt to schedule CT sinus which is 5/14 and then f/u ov with you on 5/19. Yesterday she called and asked for referral to ENT. You ordered CT sinus and said she would need cxr, ov and then ENT referral.  Please advise on cough and tongue complaint.

## 2018-12-13 NOTE — Telephone Encounter (Signed)
Called and spoke with pt letting her know that MW sent in refill of Tramadol. Pt expressed understanding. Nothing further needed.

## 2018-12-20 ENCOUNTER — Other Ambulatory Visit: Payer: Self-pay | Admitting: Internal Medicine

## 2018-12-20 ENCOUNTER — Inpatient Hospital Stay: Admission: RE | Admit: 2018-12-20 | Payer: Medicare Other | Source: Ambulatory Visit

## 2018-12-20 ENCOUNTER — Ambulatory Visit
Admission: RE | Admit: 2018-12-20 | Discharge: 2018-12-20 | Disposition: A | Discharge status: Home / Self Care | Payer: Medicare Other | Source: Ambulatory Visit | Attending: Internal Medicine | Admitting: Internal Medicine

## 2018-12-20 DIAGNOSIS — F4325 Adjustment disorder with mixed disturbance of emotions and conduct: Secondary | ICD-10-CM | POA: Diagnosis not present

## 2018-12-20 DIAGNOSIS — R05 Cough: Secondary | ICD-10-CM

## 2018-12-20 DIAGNOSIS — R058 Other specified cough: Secondary | ICD-10-CM

## 2018-12-20 DIAGNOSIS — R059 Cough, unspecified: Secondary | ICD-10-CM

## 2018-12-24 NOTE — Progress Notes (Signed)
Called and left a detailed msg on machine ok per [facility]

## 2018-12-25 ENCOUNTER — Ambulatory Visit: Payer: Medicare Other | Admitting: Internal Medicine

## 2019-01-03 DIAGNOSIS — F4325 Adjustment disorder with mixed disturbance of emotions and conduct: Secondary | ICD-10-CM | POA: Diagnosis not present

## 2019-01-04 ENCOUNTER — Telehealth: Payer: Self-pay | Admitting: Neurology

## 2019-01-04 DIAGNOSIS — S43431A Superior glenoid labrum lesion of right shoulder, initial encounter: Secondary | ICD-10-CM | POA: Diagnosis not present

## 2019-01-04 DIAGNOSIS — G894 Chronic pain syndrome: Secondary | ICD-10-CM | POA: Diagnosis not present

## 2019-01-04 DIAGNOSIS — M5481 Occipital neuralgia: Secondary | ICD-10-CM | POA: Diagnosis not present

## 2019-01-04 DIAGNOSIS — M47816 Spondylosis without myelopathy or radiculopathy, lumbar region: Secondary | ICD-10-CM | POA: Diagnosis not present

## 2019-01-04 NOTE — Telephone Encounter (Signed)
Do you mind scheduling patient with Amy, Lovena Le? It can be a video of in office whatever patient prefers. Thank you!

## 2019-01-04 NOTE — Telephone Encounter (Signed)
Pt states that she is having really bad pain and bad headaches. Pt was wanting an appt soon but nothing was available till the last 2 days of the month and pt can not wait that long. Please advise.

## 2019-01-08 ENCOUNTER — Ambulatory Visit (INDEPENDENT_AMBULATORY_CARE_PROVIDER_SITE_OTHER): Payer: Medicare Other | Admitting: Family Medicine

## 2019-01-08 ENCOUNTER — Encounter: Payer: Self-pay | Admitting: Family Medicine

## 2019-01-08 ENCOUNTER — Other Ambulatory Visit: Payer: Self-pay

## 2019-01-08 DIAGNOSIS — G4719 Other hypersomnia: Secondary | ICD-10-CM | POA: Diagnosis not present

## 2019-01-08 DIAGNOSIS — G500 Trigeminal neuralgia: Secondary | ICD-10-CM

## 2019-01-08 DIAGNOSIS — G43709 Chronic migraine without aura, not intractable, without status migrainosus: Secondary | ICD-10-CM

## 2019-01-08 DIAGNOSIS — R519 Headache, unspecified: Secondary | ICD-10-CM

## 2019-01-08 DIAGNOSIS — R51 Headache: Secondary | ICD-10-CM

## 2019-01-08 DIAGNOSIS — M5481 Occipital neuralgia: Secondary | ICD-10-CM | POA: Diagnosis not present

## 2019-01-08 NOTE — Progress Notes (Signed)
Made any corrections needed, and agree with history, physical, neuro exam,assessment and plan as stated.     Antonia Ahern, MD [facility]  

## 2019-01-08 NOTE — Progress Notes (Signed)
PATIENT: Cassidy Olsen DOB: 29-Oct-1960  REASON FOR VISIT: follow up HISTORY FROM: patient  Virtual Visit via Telephone Note  I connected with Cassidy Olsen on 01/08/19 at  2:30 PM EDT by telephone and verified that I am speaking with the correct person using two identifiers.   I discussed the limitations, risks, security and privacy concerns of performing an evaluation and management service by telephone and the availability of in person appointments. I also discussed with the patient that there may be a patient responsible charge related to this service. The patient expressed understanding and agreed to proceed.   History of Present Illness:  01/08/19 Cassidy Olsen is a 58 y.o. female for follow up of occipital neuralgia and migraines. She was restarted on gabapentin after being titrated down in March. She reports that gabapentin 300mg  three times a day is very helpful for her trigeminal and occipital neuralgia. She continues to have frequent pain.  She is being treated with Vicodin twice daily for shoulder pain.  She is anticipating upcoming surgery.  She states that about a month ago, she started having regular headaches.  She reports that the headaches are tension type headache behind both eyes.  She does note these headaches in the morning fairly often.  She denies any worsening of headaches with position changes.  She has not noted any specific vision disturbance but states that she feels she needs an eye exam for updated prescription glasses.  She states that her headaches are nearly daily.  They are pounding on occasion.  She denies any light or sound sensitivity, however does have nausea from time to time.  Lying down in a dark room does help.  She is uncertain if she snores.  She lives alone.  She does endorse a dry mouth every day.  She does have frequent morning headaches.  She does endorse excessive daytime sleepiness.  She states that there are times where she will fall asleep  at her desk or in a chair.  She usually goes to bed around 10 PM and wakes around 8 AM but states that she can be up all hours of the night.  She has never had a sleep study.  She reports having an MRI in the past that was normal.  She occasionally takes an Aleve for headaches but denies regular use.  She is taking topiramate 100 mg by mouth twice daily.  She is also on amitriptyline 50 mg at night.  She takes baclofen for shoulder and back pain.   History (copied from Dr Cathren Laine notes on 02/01/2018)  HPI:  Cassidy Olsen is a 58 y.o. female here as a referral from Dr. Fara Olden for trigeminal neuralgia.  She has a past medical history spinal stenosis, osteoarthritis, irritable bowel syndrome, high cholesterol, chronic pain, occipital neuralgia, lumbar degenerative disc disease, lumbar spondylosis, failed back surgical syndrome, low back pain, she is managed for her pain by a pain management clinic.  She is on NabuMetone, oxycodone, lidocaine, gabapentin, amitriptyline, baclofen, topiramate, celexa and Nucynta.  She has had trigeminal decompression in the past. She developed right-sided trigeminal neuralgia in 2005, prior to that the occipital neuralgia. She was diagnosed at [facility] and has seen multiple neurologists and neurosurgeons. They have had her on "everything" she had etensive testing. MRIs, CTs, MRAs and diagnosed with occipital neuralgia. She then developed Trigeminal neuralgia and say a NSY and found blood vessel compressing the trigeminal nerve and she had decompressive surgery, she had MRIs of the trigeminal nerve. She  can feel keys. Her baseline level pain is a 4+, baseline is a 4/10 daily and she is at a 6 now since I am typing. She gets regular occipital blocks. She has also been seen at [facility]. She also saw doctors at [facility]. She has had sphenopalatine blocks. She has taken multiple medications including Tegretol, topamax, lyrica, neurontin, she was on about 18 medications a  day. She is currently on multiple medications now.   Currently she gets nagging pain continuously on the right, movement makes it worse, she has shooting pains, spasms radiating up the occipital head, she also shooting pain down the face, exacerbated by wind, shooting pain across the face and eye, the cheek. Worsening since March. Now having left-side symptoms. Light and sounds can trigger headaches. A dark room helps. She has significant light sensitivity, sound sensitivity, movement makes it worse, no nausea or vomiting.   Reviewed notes, labs and imaging from outside physicians, which showed:   Review of records she is had a nerve block of the greater occipital nerve.  She has a history of occipital neuralgia as well as right trigeminal neuralgia.  She has undergone decompressive surgery in [place] for the trigeminal neuralgia.  The occipital neuralgia has responded well to greater occipital nerve injections in the past.  She does not have a local neurologist.  Pain is a 7 out of 10.  Will try to request imaging from institutions she mentions   Observations/Objective:  Generalized: Well developed, in no acute distress  Mentation: Alert oriented to time, place, history taking. Follows all commands speech and language fluent   Assessment and Plan:  58 y.o. year old female  has a past medical history of Asthma, Chronic pain, Family history of breast cancer, Family history of prostate cancer, GERD (gastroesophageal reflux disease), High cholesterol, IBS (irritable bowel syndrome), Migraine, Occipital neuralgia, Osteoarthritis, Spinal stenosis, Trigeminal neuralgia, and Trigeminal neuralgia. with    ICD-10-CM   1. Chronic migraine without aura without status migrainosus, not intractable G43.709   2. Trigeminal neuralgia of right side of face G50.0   3. Occipital neuralgia of right side M54.81   4. Excessive daytime sleepiness G47.19 Ambulatory referral to Sleep Studies  5. Uncontrolled  morning headache R51 Ambulatory referral to Sleep Studies   We have discussed many potential causes of her headaches.  I am concerned that sleep apnea could potentially play a role and feel that a sleep referral is warranted.  She agrees to this referral.  She is very hesitant about starting any medications stating that she is already on too many.  We have discussed Aimovig in detail.  I have advised that she restarts this medication and call should she change her mind.  I will anticipate starting Aimovig should she accept.  I have encouraged her to avoid over-the-counter analgesics and only use 1-2 times weekly at max.  I would like for her to continue gabapentin 300 mg 3 times a day as prescribed.  She will also continue topiramate 100 mg twice daily and amitriptyline 50 mg daily.  Complementary therapies discussed for migraine management.  I have advised that she seek out an ophthalmologist for formal eye exam.  We have discussed repeating MRI, however, she does not feel this is warranted at this time.  She agrees to follow-up with me following consultation with sleep provider.  She will call with any changes or worsening of headaches.   Orders Placed This Encounter  Procedures   Ambulatory referral to Sleep Studies  Referral Priority:   Routine    Referral Type:   Consultation    Referral Reason:   Specialty Services Required    Number of Visits Requested:   1    No orders of the defined types were placed in this encounter.    Follow Up Instructions:  I discussed the assessment and treatment plan with the patient. The patient was provided an opportunity to ask questions and all were answered. The patient agreed with the plan and demonstrated an understanding of the instructions.   The patient was advised to call back or seek an in-person evaluation if the symptoms worsen or if the condition fails to improve as anticipated.  I provided 35 minutes of non-face-to-face time during this  encounter.  Provider is located at her place of residence during video conference.  Patient is located at her place of residence.  Maryelizabeth Kaufmann, CMA helped to facilitate visit.   Debbora Presto, NP

## 2019-01-08 NOTE — Telephone Encounter (Signed)
Due to current COVID 19 pandemic, our office is severely reducing in office visits until further notice, in order to minimize the risk to our patients and healthcare providers.   Called patient and scheduled a virtual visit with Amy for today, 6/2, at 2:30. Patient verbalized understanding of the doxy.me process and I have sent her an e-mail with link and directions as well as my name and office number/hours for reference. Patient understands that she will receive a call from RN to update chart.  Pt understands that although there may be some limitations with this type of visit, we will take all precautions to reduce any security or privacy concerns.  Pt understands that this will be treated like an in office visit and we will file with pt's insurance, and there may be a patient responsible charge related to this service.

## 2019-01-08 NOTE — Telephone Encounter (Signed)
Noted  

## 2019-01-09 ENCOUNTER — Ambulatory Visit: Payer: Medicare Other

## 2019-01-10 DIAGNOSIS — L658 Other specified nonscarring hair loss: Secondary | ICD-10-CM | POA: Diagnosis not present

## 2019-01-10 DIAGNOSIS — L811 Chloasma: Secondary | ICD-10-CM | POA: Diagnosis not present

## 2019-01-14 DIAGNOSIS — Z20828 Contact with and (suspected) exposure to other viral communicable diseases: Secondary | ICD-10-CM | POA: Diagnosis not present

## 2019-01-14 DIAGNOSIS — G5601 Carpal tunnel syndrome, right upper limb: Secondary | ICD-10-CM | POA: Diagnosis not present

## 2019-01-15 ENCOUNTER — Telehealth: Payer: Self-pay | Admitting: Pulmonary Disease

## 2019-01-15 DIAGNOSIS — K219 Gastro-esophageal reflux disease without esophagitis: Secondary | ICD-10-CM | POA: Diagnosis not present

## 2019-01-15 DIAGNOSIS — R05 Cough: Secondary | ICD-10-CM | POA: Diagnosis not present

## 2019-01-15 DIAGNOSIS — R6889 Other general symptoms and signs: Secondary | ICD-10-CM | POA: Diagnosis not present

## 2019-01-15 NOTE — Telephone Encounter (Signed)
Called patient to schedule pre-procedure COVID testing. Patient was not happy to receive the call. Patient had spoken with Sharl Ma earlier in the day and had reported that she had a COVID test done at [facility].  I advised patient that we needed the pre-procedure testing to be done closer to the date of the PFT and at our testing site.  Patient stated she doesn't have the money to pay for a second COVID test and that Medicare paid for the [facility] test so she didn't understand why she would need to repeat the COVID testing. Patient was also upset that Dr. Melvyn Novas is listed in her chart and stated that she is an [employer] patient and doesn't want to be associate with other physicians. Patient reported that she has an upcoming ENT surgery in July.  Will route message to Kathlee Nations to see about billing and if another COVID test will be covered.  Kathlee Nations...please let me know if it will be covered so I can schedule and if not, I need to cancel the PFT.  Routing to Dr. Valeta Harms and [place] as Juluis Rainier.

## 2019-01-16 ENCOUNTER — Telehealth: Payer: Self-pay | Admitting: Neurology

## 2019-01-16 NOTE — Telephone Encounter (Signed)
Due to current COVID 19 pandemic, our office is severely reducing in office visits, in order to minimize the risk to our patients and healthcare providers.    Pt understands that although there may be some limitations with this type of visit, we will take all precautions to reduce any security or privacy concerns.  Pt understands that this will be treated like an in office visit and we will file with pt's insurance, and there may be a patient responsible charge related to this service.  Pt's email is [email or web address]. Pt will be using [employer] for their virtual visit. Pt understands that the nurse will be calling to go over pt's chart. Pt was informed to measure neck size in inches prior to appointment with physician.

## 2019-01-17 NOTE — Telephone Encounter (Signed)
I was not able to get a definitve answer about frequency limitations on COVID testing.  I call the patient, let her know that usually means they will pay for multiple tests, but there was no guarantee.  She understood and stated she thought the best thing to do was to push the PFT out until August.  I told her we would do that, but if she had any issues in the mean time to please give [place] a call.

## 2019-01-17 NOTE — Telephone Encounter (Signed)
I have canceled the PFT and will follow up with patient to schedule in August - 01/17/2019-pr

## 2019-01-17 NOTE — Telephone Encounter (Signed)
Kathlee Nations were you able to check into the financial aspect and talk with the patient? I am waiting to schedule the pre-procedure test until I hear from you.  Thanks!

## 2019-01-21 NOTE — Telephone Encounter (Addendum)
I called pt. Pt's meds, allergies, and PMH were updated.  Pt has never had a sleep study but does endorse snoring.  Pt's recent weight is 159 lbs and she is 5'3.  Pt was instructed on how to measure her neck size for the appointment.  Epworth Sleepiness Scale 0= would never doze 1= slight chance of dozing 2= moderate chance of dozing 3= high chance of dozing  Sitting and reading: 1 Watching TV: 1 Sitting inactive in a public place (ex. Theater or meeting): 1 As a passenger in a car for an hour without a break: 0 Lying down to rest in the afternoon: 1 Sitting and talking to someone: 0 Sitting quietly after lunch (no alcohol): 1 In a car, while stopped in traffic: 1 Total: 6  FSS: 38

## 2019-01-22 ENCOUNTER — Encounter: Payer: Self-pay | Admitting: Neurology

## 2019-01-22 ENCOUNTER — Ambulatory Visit (INDEPENDENT_AMBULATORY_CARE_PROVIDER_SITE_OTHER): Payer: Medicare Other | Admitting: Neurology

## 2019-01-22 ENCOUNTER — Other Ambulatory Visit: Payer: Self-pay

## 2019-01-22 DIAGNOSIS — E663 Overweight: Secondary | ICD-10-CM

## 2019-01-22 DIAGNOSIS — R51 Headache: Secondary | ICD-10-CM | POA: Diagnosis not present

## 2019-01-22 DIAGNOSIS — R351 Nocturia: Secondary | ICD-10-CM

## 2019-01-22 DIAGNOSIS — R519 Headache, unspecified: Secondary | ICD-10-CM

## 2019-01-22 DIAGNOSIS — G4719 Other hypersomnia: Secondary | ICD-10-CM

## 2019-01-22 DIAGNOSIS — F4325 Adjustment disorder with mixed disturbance of emotions and conduct: Secondary | ICD-10-CM | POA: Diagnosis not present

## 2019-01-22 NOTE — Patient Instructions (Signed)
Given verbally, during today's virtual video-based encounter, with verbal feedback received.   

## 2019-01-22 NOTE — Progress Notes (Signed)
[name], MD, [name] [facility] [address] [place], [place] [address]   Virtual Visit via Video Note on 01/22/2019:  I connected with Ms. Cassidy Olsen on 06/57/20 at  9:30 AM EDT by a video enabled telemedicine application and verified that I am speaking with the correct person using two identifiers.   I discussed the limitations of evaluation and management by telemedicine and the availability of in person appointments. The patient expressed understanding and agreed to proceed.  History of Present Illness: Cassidy Olsen is a 58 year old right-handed woman with an underlying medical history of trigeminal neuralgia, recurrent migraine headaches, irritable bowel syndrome, reflux disease, hyperlipidemia, asthma, osteoarthritis and spinal stenosis and borderline overweight state, who presents for a virtual, video based appointment via [email or web address] for evaluation of her sleep disorder, in particular, concern for underlying obstructive sleep apnea.  The patient is unaccompanied today and joins via laptop from home, I am located in my office.  She is referred by Debbora Presto, nurse practitioner and I reviewed her virtual visit note from 01/08/2019.  The patient has seen Dr. Jaynee Eagles for recurrent headaches. She reports morning headaches and nocturnal headaches, is not sure if she snores, she lives alone.  She has no family history of OSA.  She will typically get up to use the bathroom once per average night.  She is tired during the day, she does not wake up rested.  She does nap sometimes.  She does not drink caffeine daily, is a non-smoker and drinks alcohol very occasionally.  Bedtime is generally around 10 PM, she does watch TV in bed but turns it off or she is on her tablet before falling asleep.  She does not have a set rise time.  She does not work.  She has no pets in the household.  She was able to lose weight last year, she has recently regained weight.  Her  current weight is 158. Her Epworth sleepiness score is 6 out of 24, fatigue severity score is 38 out of 63.    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 Pershing Proud at [facility]  2014   JOINT REPLACEMENT     R knee   OVARIAN CYST REMOVAL  05/2010   right knee revision  2016    Her Family History Is Significant For: Family History  Problem Relation Age of Onset   Ulcers Mother 28       peptic ulcer   Prostate cancer Father 92   Breast cancer Sister 71       ER+/PR+/Her2- breast cancer   Breast cancer Paternal Grandmother    Diabetes Paternal Grandmother    Cancer Paternal  Uncle        unknown cancers    Her Social History Is Significant For: Social History   Socioeconomic History   Marital status: Single    Spouse name: Not on file   Number of children: 1   Years of education: Not on file   Highest education level: Not on file  Occupational History   Not on file  Social Needs   Financial resource strain: Not on file   Food insecurity    Worry: Not on file    Inability: Not on file   Transportation needs    Medical: Not on file    Non-medical: Not on file  Tobacco Use   Smoking status: Never Smoker   Smokeless tobacco: Never Used  Substance and Sexual Activity   Alcohol  use: Yes    Comment: socially   Drug use: No   Sexual activity: Not on file  Lifestyle   Physical activity    Days per week: Not on file    Minutes per session: Not on file   Stress: Not on file  Relationships   Social connections    Talks on phone: Not on file    Gets together: Not on file    Attends religious service: Not on file    Active member of club or organization: Not on file    Attends meetings of clubs or organizations: Not on file    Relationship status: Not on file  Other Topics Concern   Not on file  Social History Narrative   Not on file    Her Allergies Are:  Allergies  Allergen Reactions   Aspirin    Codeine     Cough meds with codeine-have withdraw symptoms when done   Ibuprofen    Latex    Penicillins   :   Her Current Medications Are:  Outpatient Encounter Medications as of 01/22/2019  Medication Sig   amitriptyline (ELAVIL) 50 MG tablet Take 50 mg by mouth at bedtime.   baclofen (LIORESAL) 10 MG tablet Take 10 mg by mouth daily.    budesonide-formoterol (SYMBICORT) 80-4.5 MCG/ACT inhaler Inhale 2 puffs into the lungs every 12 (twelve) hours.   celecoxib (CELEBREX) 200 MG capsule Take 200 mg by mouth daily.    clotrimazole (MYCELEX) 10 MG troche Take 1 tablet (10 mg total) by mouth 4 (four) times daily.   dexlansoprazole (DEXILANT) 60 MG capsule Take 60 mg by mouth daily.   fenofibrate (TRICOR) 145 MG tablet Take by mouth.   gabapentin (NEURONTIN) 300 MG capsule Take 1 capsule (300 mg total) by mouth 3 (three) times daily.   lubiprostone (AMITIZA) 24 MCG capsule Take 24 mcg by mouth 2 (two) times daily with a meal.   Melatonin 10 MG TABS Take by mouth.   montelukast (SINGULAIR) 10 MG tablet Take 1 tablet (10 mg total) by mouth at bedtime.   nystatin (MYCOSTATIN) 100000 UNIT/ML suspension Take 5 mLs (500,000 Units total) by mouth 3 (three) times daily.   Simethicone 180 MG CAPS Take 1 capsule (180 mg total) by mouth 2 (two)  times daily.   Spacer/Aero-Holding Chambers (AEROCHAMBER MV) inhaler Use as instructed   tapentadol (NUCYNTA ER) 100 MG 12 hr tablet Take 100 mg by mouth every 12 (twelve) hours.   topiramate (TOPAMAX) 100 MG tablet Take 100 mg by mouth 2 (two) times daily.   traMADol (ULTRAM) 50 MG tablet Take 1 tablet (50 mg total) by mouth every 4 (four) hours as needed (cough /  sore throat).   valACYclovir (VALTREX) 500 MG tablet Take 500 mg by mouth daily.   No facility-administered encounter medications on file as of 01/22/2019.   :   Review of Systems:  Out of a complete 14 point review of systems, all are reviewed and negative with the exception of these symptoms as listed below:  Observations/Objective: Neck circumference by self report: 14.25 in. Weight by self report 158 lb. On examination, she is pleasant and conversant, in no acute distress, good comprehension and language skills.  Speech is clear without dysarthria, hypophonia or voice tremor.  Face is symmetric with normal facial animation noted, extraocular movements well preserved in all directions, shoulder height equal.Airway examination reveals a Mallampati class II, tongue protrudes centrally in palate elevates symmetrically.  She does appear to have a moderately crowded airway secondary to small airway entry and wider uvula.  Tonsils in place, appear to be 1+.Upper body mobility and coordination grossly intact.  Assessment and Plan: Ms. Cletus Mehlhoff is a 58 year old right-handed woman with an underlying medical history of trigeminal neuralgia, recurrent migraine headaches, irritable bowel syndrome, reflux disease, hyperlipidemia, asthma, osteoarthritis and spinal stenosis and borderline overweight state, who presents for a virtual, video based appointment via doxy.me for evaluation of her sleep disorder, in particular, concern for underlying obstructive sleep apnea. The patient's medical history and physical exam (albeit limited with  current video-based evaluation) are concerning for a diagnosis of obstructive sleep apnea. I discussed with the patient the diagnosis of OSA, its prognosis and treatment options. I explained in particular the risks and ramifications of untreated moderate to severe OSA, especially with respect to developing cardiovascular disease down the Road, including congestive heart failure, difficult to treat hypertension, cardiac arrhythmias, or stroke. Even type 2 diabetes has, in part, been linked to untreated OSA. Symptoms of untreated OSA may include daytime sleepiness, memory problems, mood irritability and mood disorder such as depression and anxiety, lack of energy, as well as recurrent headaches, especially morning headaches. We talked about the importance of weight control. We talked about the importance of maintaining good sleep hygiene. I recommended the following at this time: sleep study. I explained the sleep test procedure to the patient and also outlined possible treatment options of OSA, including the use of CPAP. I answered all her questions today and the patient was in agreement. I plan to see the patient back after the sleep study is completed and encouraged her to call with any interim questions, concerns, problems or updates.   [name], MD, [name]    Follow Up Instructions:    I discussed the assessment and treatment plan with the patient. The patient was provided an opportunity to ask questions and all were answered. The patient agreed with the plan and demonstrated an understanding of the instructions.   The patient was advised to call back or seek an in-person evaluation if the symptoms worsen or if the condition fails to improve as anticipated.  I provided 25 minutes of non-face-to-face time during this encounter.   [name], MD

## 2019-01-31 DIAGNOSIS — F4325 Adjustment disorder with mixed disturbance of emotions and conduct: Secondary | ICD-10-CM | POA: Diagnosis not present

## 2019-02-01 DIAGNOSIS — M5481 Occipital neuralgia: Secondary | ICD-10-CM | POA: Diagnosis not present

## 2019-02-01 DIAGNOSIS — Z79891 Long term (current) use of opiate analgesic: Secondary | ICD-10-CM | POA: Diagnosis not present

## 2019-02-01 DIAGNOSIS — G894 Chronic pain syndrome: Secondary | ICD-10-CM | POA: Diagnosis not present

## 2019-02-01 DIAGNOSIS — G500 Trigeminal neuralgia: Secondary | ICD-10-CM | POA: Diagnosis not present

## 2019-02-01 DIAGNOSIS — Z79899 Other long term (current) drug therapy: Secondary | ICD-10-CM | POA: Diagnosis not present

## 2019-02-01 DIAGNOSIS — S43431A Superior glenoid labrum lesion of right shoulder, initial encounter: Secondary | ICD-10-CM | POA: Diagnosis not present

## 2019-02-05 DIAGNOSIS — H25813 Combined forms of age-related cataract, bilateral: Secondary | ICD-10-CM | POA: Diagnosis not present

## 2019-02-05 DIAGNOSIS — H5310 Unspecified subjective visual disturbances: Secondary | ICD-10-CM | POA: Diagnosis not present

## 2019-02-05 DIAGNOSIS — H04123 Dry eye syndrome of bilateral lacrimal glands: Secondary | ICD-10-CM | POA: Diagnosis not present

## 2019-02-06 ENCOUNTER — Ambulatory Visit (INDEPENDENT_AMBULATORY_CARE_PROVIDER_SITE_OTHER): Payer: Medicare Other | Admitting: Neurology

## 2019-02-06 ENCOUNTER — Other Ambulatory Visit: Payer: Self-pay

## 2019-02-06 DIAGNOSIS — G4720 Circadian rhythm sleep disorder, unspecified type: Secondary | ICD-10-CM

## 2019-02-06 DIAGNOSIS — G4710 Hypersomnia, unspecified: Secondary | ICD-10-CM

## 2019-02-06 DIAGNOSIS — R519 Headache, unspecified: Secondary | ICD-10-CM

## 2019-02-06 DIAGNOSIS — G4719 Other hypersomnia: Secondary | ICD-10-CM

## 2019-02-06 DIAGNOSIS — E663 Overweight: Secondary | ICD-10-CM

## 2019-02-06 DIAGNOSIS — R351 Nocturia: Secondary | ICD-10-CM

## 2019-02-06 HISTORY — PX: ROTATOR CUFF REPAIR: SHX139

## 2019-02-07 ENCOUNTER — Telehealth: Payer: Self-pay

## 2019-02-07 NOTE — Procedures (Signed)
PATIENT'S NAME:  Cassidy Olsen, Cassidy Olsen DOB:      [birth year]      MR#:    [id number]     DATE OF RECORDING: 02/06/2019 REFERRING M.D.:  Amy Ubaldo Glassing, NP Study Performed:   Baseline Polysomnogram HISTORY: 58 year old woman with a history of trigeminal neuralgia, recurrent migraine headaches, irritable bowel syndrome, reflux disease, hyperlipidemia, asthma, osteoarthritis and spinal stenosis and borderline overweight state, who reports morning headaches. The patient endorsed the Epworth Sleepiness Scale at 6 points. The patient's neck circumference measured 14.2 inches.  CURRENT MEDICATIONS: Elavil, Lioresal, Symbicort, Celebrex, Mycelex, Dexilant, Tricor, Neurontin, Amitiza, Melatonin, Singulair, Mycostatin, Nucynta, Topamax, Ultram, Valtrex   PROCEDURE:  This is a multichannel digital polysomnogram utilizing the Somnostar 11.2 system.  Electrodes and sensors were applied and monitored per AASM Specifications.   EEG, EOG, Chin and Limb EMG, were sampled at 200 Hz.  ECG, Snore and Nasal Pressure, Thermal Airflow, Respiratory Effort, CPAP Flow and Pressure, Oximetry was sampled at 50 Hz. Digital video and audio were recorded.      BASELINE STUDY  Lights Out was at 00:05 and Lights On at 06:03.  Total recording time (TRT) was 358.5 minutes, with a total sleep time (TST) of 288 minutes.   The patient's sleep latency was 63 minutes, which is delayed. REM latency was 77 minutes, which is normal.  The sleep efficiency was 80.3 %.     SLEEP ARCHITECTURE: WASO (Wake after sleep onset) was 22.5 minutes with minimal to mild sleep fragmentation noted. There were 8 minutes in Stage N1, 120 minutes Stage N2, 102.5 minutes Stage N3 and 57.5 minutes in Stage REM.  The percentage of Stage N1 was 2.8%, Stage N2 was 41.7%, Stage N3 was 35.6%, which is increased, and Stage R (REM sleep) was 20.%, which is normal. The arousals were noted as: 6 were spontaneous, 0 were associated with PLMs, 0 were associated with respiratory  events.  RESPIRATORY ANALYSIS:  There were a total of 1 respiratory events:  0 obstructive apneas, 0 central apneas and 0 mixed apneas with a total of 0 apneas and an apnea index (AI) of 0 /hour. There were 1 hypopneas with a hypopnea index of .2 /hour. The patient also had 0 respiratory event related arousals (RERAs).      The total APNEA/HYPOPNEA INDEX (AHI) was .2 /hour and the total RESPIRATORY DISTURBANCE INDEX was .2 /hour.  1 events occurred in REM sleep and 0 events in NREM. The REM AHI was 1. /hour, versus a non-REM AHI of 0. The patient spent 74 minutes of total sleep time in the supine position and 214 minutes in non-supine.. The supine AHI was 0.0 versus a non-supine AHI of 0.3.  OXYGEN SATURATION & C02:  The Wake baseline 02 saturation was 98%, with the lowest being 91%. Time spent below 89% saturation equaled 0 minutes.  PERIODIC LIMB MOVEMENTS: The patient had a total of 0 Periodic Limb Movements.  The Periodic Limb Movement (PLM) index was 0 and the PLM Arousal index was 0/hour.  Audio and video analysis did not show any abnormal or unusual movements, behaviors, phonations or vocalizations. The patient took 1 bathroom break. No significant snoring was noted. The EKG was in keeping with normal sinus rhythm (NSR).  Post-study, the patient indicated that sleep was the same as usual.   IMPRESSION:  1. Dysfunctions associated with sleep stages or arousal from sleep  RECOMMENDATIONS:  1. This study does not demonstrate any significant obstructive or central sleep disordered breathing; no significant snoring  was noted. This study does not support an intrinsic sleep disorder as a cause of the patient's symptoms. Other causes, including circadian rhythm disturbances, an underlying mood disorder, medication effect and/or an underlying medical problem cannot be ruled out. 2. This study shows some sleep fragmentation and mildly abnormal sleep stage percentages; these are nonspecific findings  and per se do not signify an intrinsic sleep disorder or a cause for the patient's sleep-related symptoms. Causes include (but are not limited to) the first night effect of the sleep study, circadian rhythm disturbances, medication effect or an underlying mood disorder or medical problem.  3. The patient should be cautioned not to drive, work at heights, or operate dangerous or heavy equipment when tired or sleepy. Review and reiteration of good sleep hygiene measures should be pursued with any patient. 4. The patient will be advised to follow up with the referring provider, who will be notified of the test results.   I certify that I have reviewed the entire raw data recording prior to the issuance of this report in accordance with the Standards of Accreditation of the American Academy of Sleep Medicine (AASM)    [name], MD, [name] Diplomat, American Board of Neurology and Sleep Medicine (Neurology and Sleep Medicine)

## 2019-02-07 NOTE — Telephone Encounter (Signed)
I called pt, discussed her sleep study results and recommendations. Pt reports that she will need her sleep study sent to her surgeon. She will call back with that name and fax number and I will ask our MR dept to assist with this. Pt verbalized understanding of results. Pt had no questions at this time but was encouraged to call back if questions arise.

## 2019-02-07 NOTE — Progress Notes (Signed)
Patient referred by Debbora Presto, NP, seen by me on 01/22/19 in VV, diagnostic PSG on 02/06/19.  Patient requested test results urgently.   Please call and notify the patient that the recent sleep study did not show any significant obstructive sleep apnea and no significant snoring was noted. It took her over one hour to fall asleep, but after that, she slept reasonably well and achieved all stages of sleep. Please inform patient that she can FU as planned with Amy.   Thanks,  [name], MD, [name] [facility])

## 2019-02-07 NOTE — Telephone Encounter (Signed)
-----   Message from [name], MD sent at 02/07/2019  8:58 AM EDT ----- Patient referred by Debbora Presto, NP, seen by me on 01/22/19 in VV, diagnostic PSG on 02/06/19.  Patient requested test results urgently.   Please call and notify the patient that the recent sleep study did not show any significant obstructive sleep apnea and no significant snoring was noted. It took her over one hour to fall asleep, but after that, she slept reasonably well and achieved all stages of sleep. Please inform patient that she can FU as planned with Amy.   Thanks,  [name], MD, [name] [facility])

## 2019-02-11 DIAGNOSIS — M659 Synovitis and tenosynovitis, unspecified: Secondary | ICD-10-CM | POA: Diagnosis not present

## 2019-02-11 DIAGNOSIS — G8918 Other acute postprocedural pain: Secondary | ICD-10-CM | POA: Diagnosis not present

## 2019-02-11 DIAGNOSIS — X58XXXA Exposure to other specified factors, initial encounter: Secondary | ICD-10-CM | POA: Diagnosis not present

## 2019-02-11 DIAGNOSIS — M7501 Adhesive capsulitis of right shoulder: Secondary | ICD-10-CM | POA: Diagnosis not present

## 2019-02-11 DIAGNOSIS — Y999 Unspecified external cause status: Secondary | ICD-10-CM | POA: Diagnosis not present

## 2019-02-11 DIAGNOSIS — M19011 Primary osteoarthritis, right shoulder: Secondary | ICD-10-CM | POA: Diagnosis not present

## 2019-02-11 DIAGNOSIS — S43431A Superior glenoid labrum lesion of right shoulder, initial encounter: Secondary | ICD-10-CM | POA: Diagnosis not present

## 2019-02-11 DIAGNOSIS — S46011A Strain of muscle(s) and tendon(s) of the rotator cuff of right shoulder, initial encounter: Secondary | ICD-10-CM | POA: Diagnosis not present

## 2019-02-11 IMAGING — US US ABDOMEN LIMITED
1 series · 14 of 25 positions shown · non-contrast
Comparison: None.

CLINICAL DATA: 55 y/o  F; right upper quadrant pain.

EXAM:
US ABDOMEN LIMITED - RIGHT UPPER QUADRANT

[Series 1: us abdomen limited · 0.22mm/px · 14 of 41 slices shown]
[im 1/41]
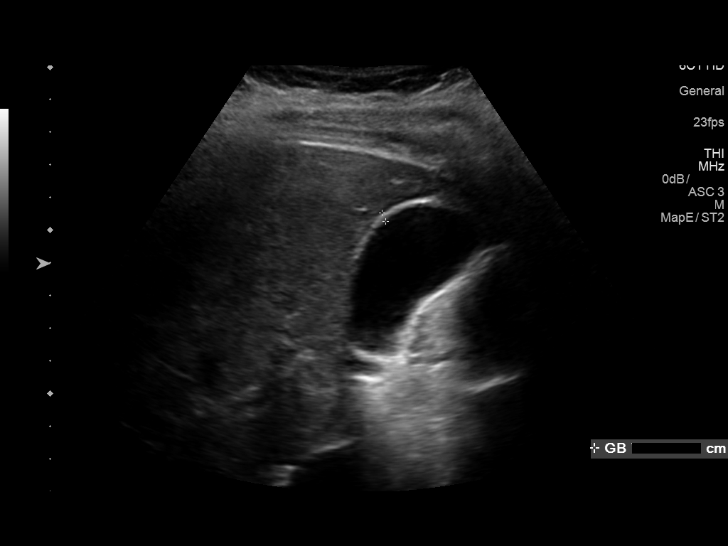
[im 4/41]
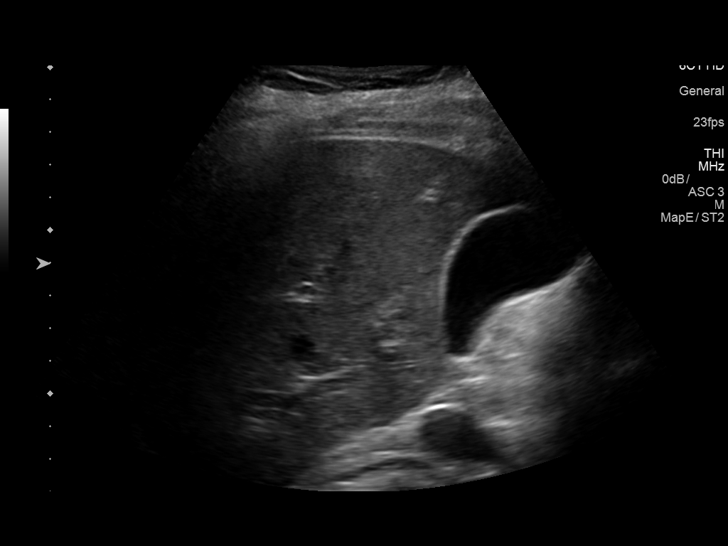
[im 7/41]
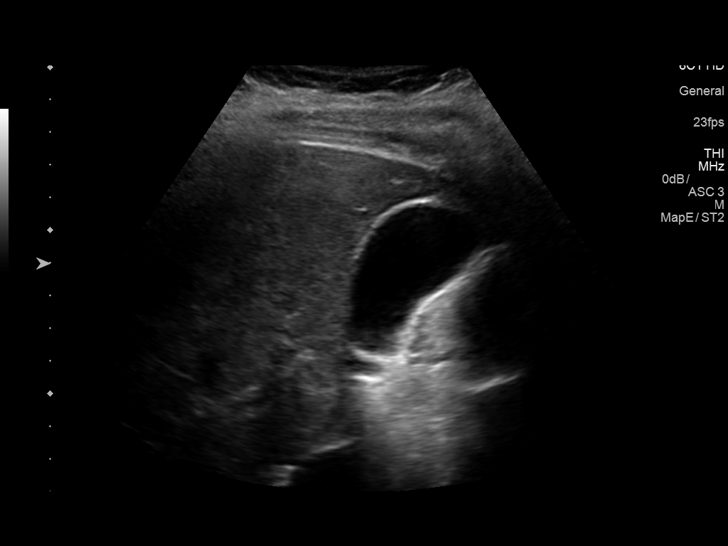
[im 11/41]
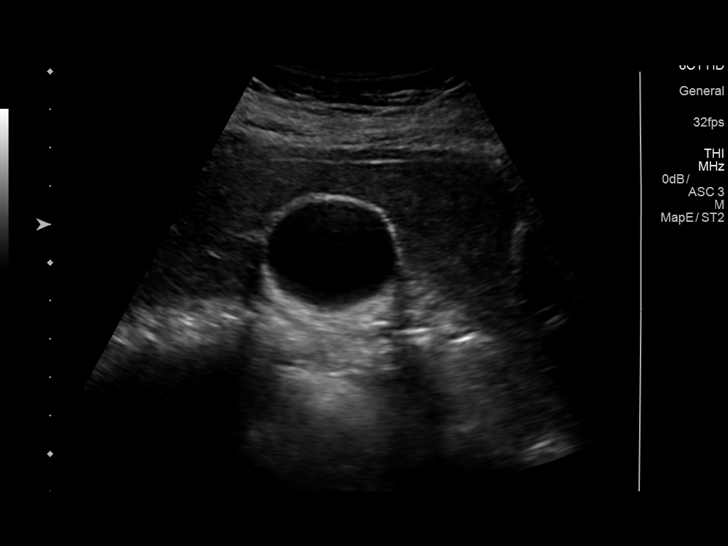
[im 14/41]
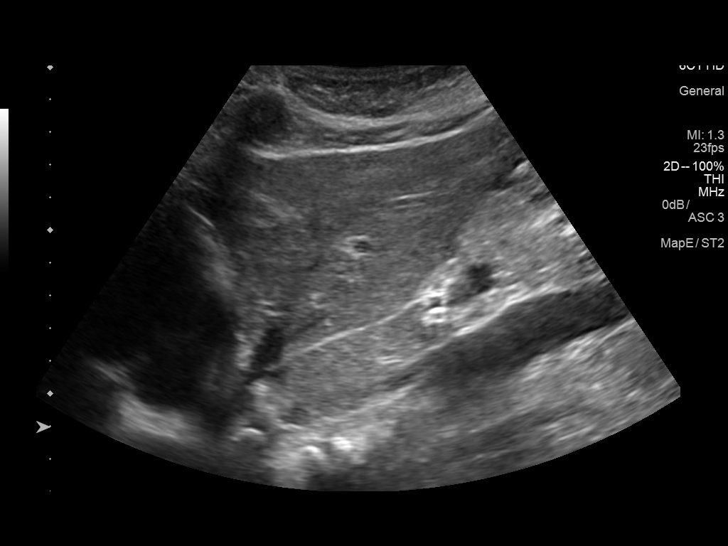
[im 16/41]
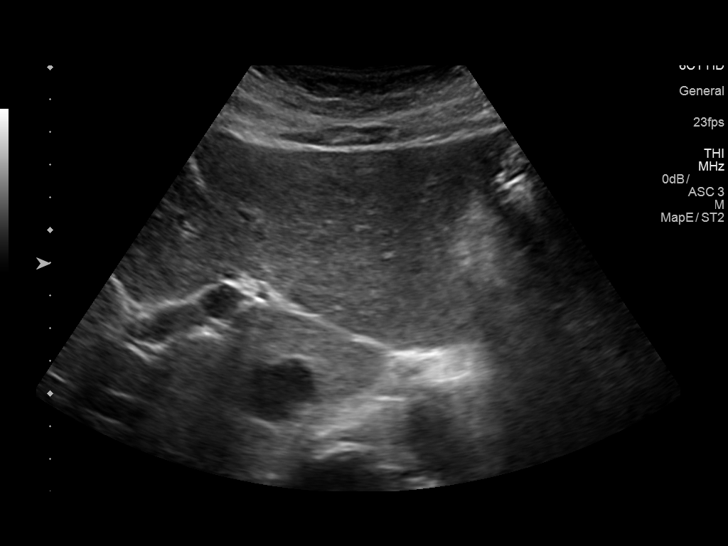
[im 19/41]
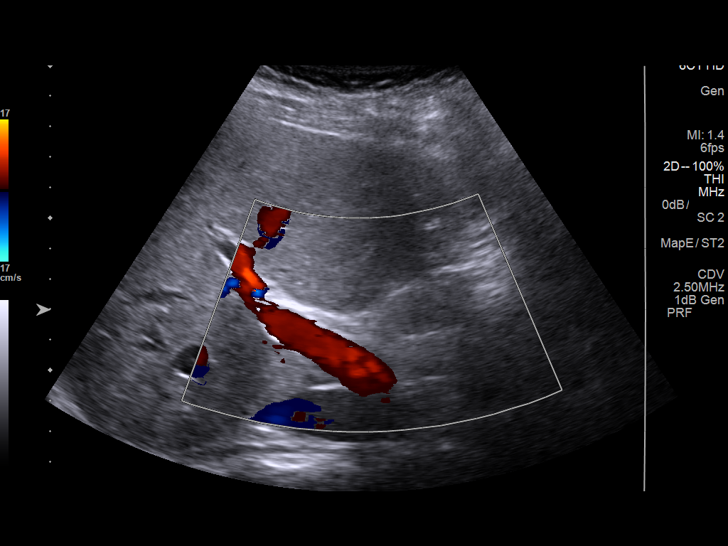
[im 22/41]
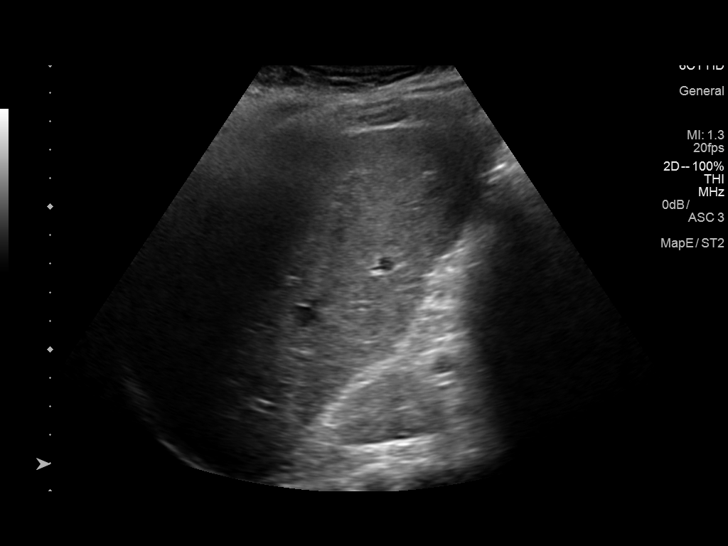
[im 26/41]
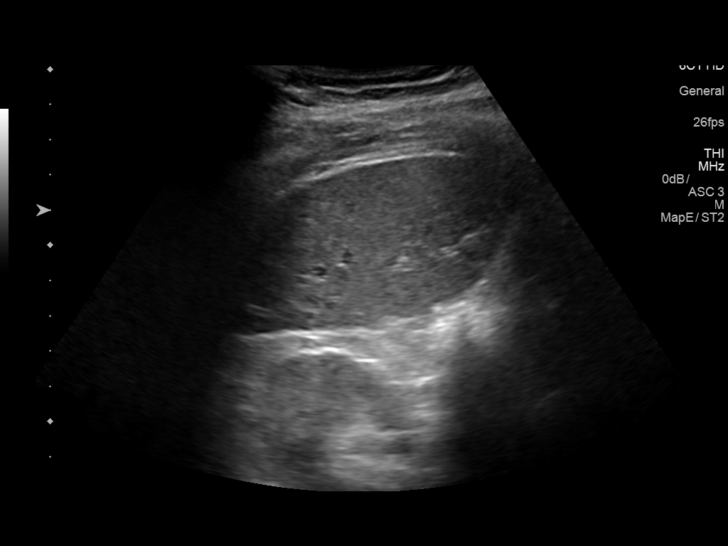
[im 27/41]
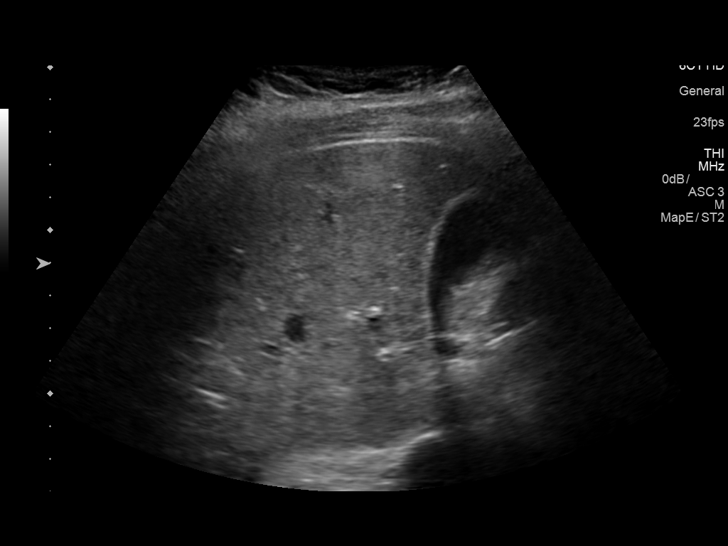
[im 31/41]
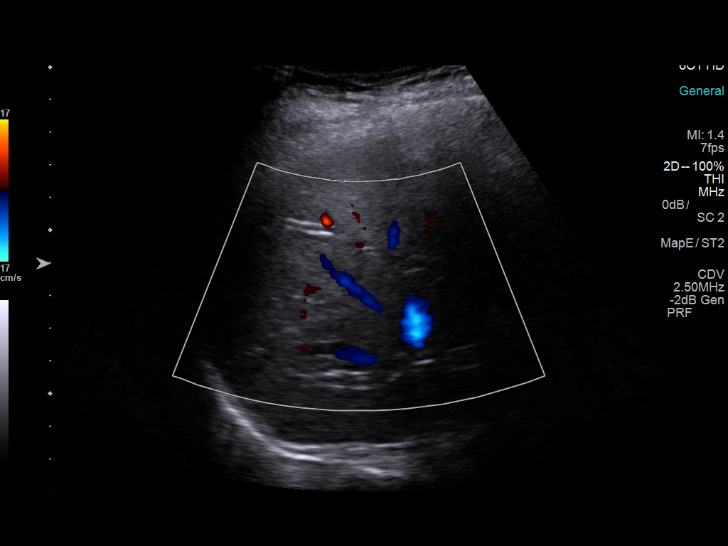
[im 34/41]
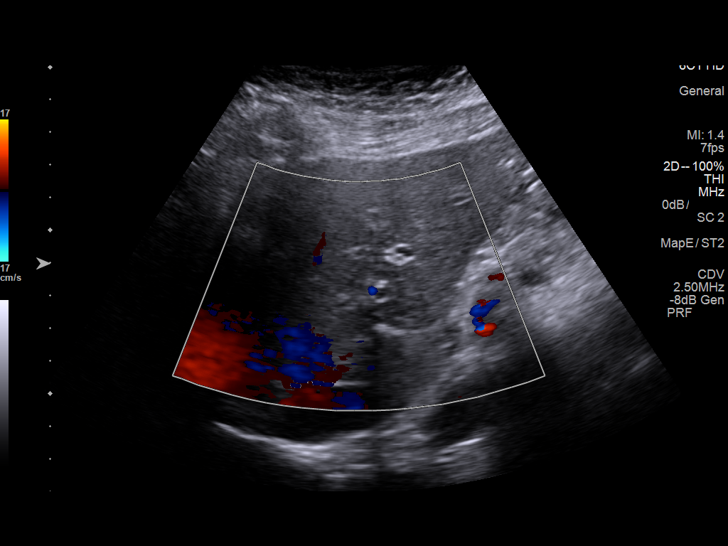
[im 37/41]
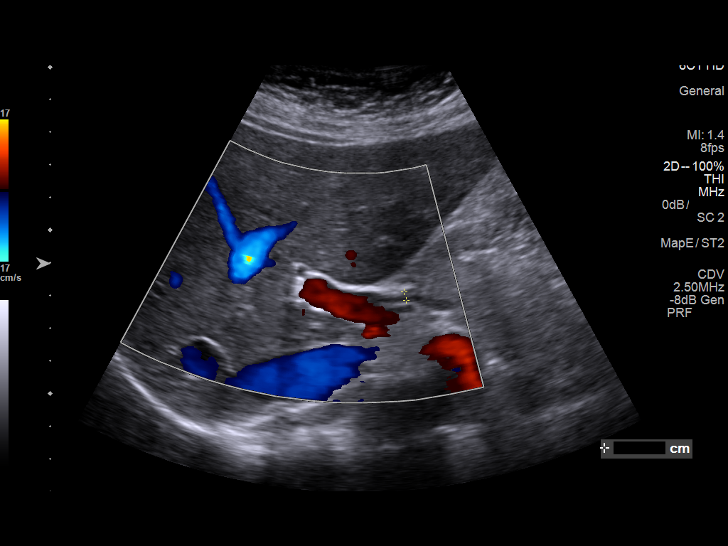
[im 41/41]
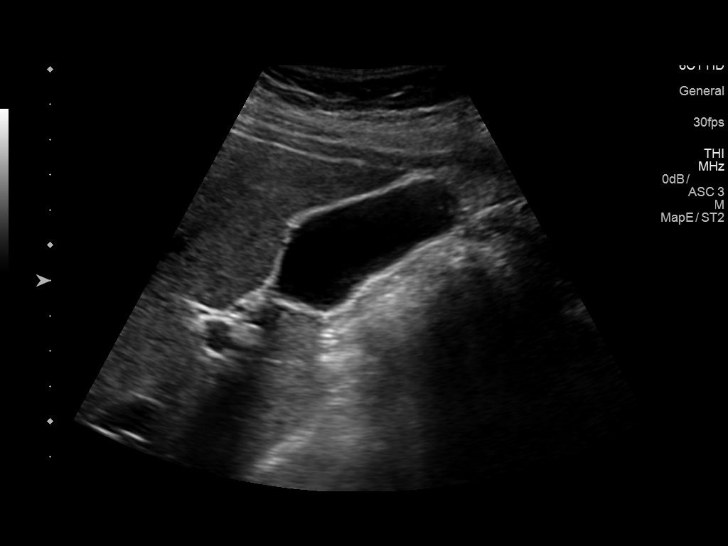

[14 of 25 positions shown; findings below may reference images not displayed]

FINDINGS: Gallbladder:

No gallstones or wall thickening visualized. No sonographic Murphy
sign noted by sonographer.

Common bile duct:

Diameter: 2.7 mm

Liver:

Mild heterogeneity of liver echotexture and intrahepatic biliary
ductal dilatation. Tenderness over liver with probe pressure.
IMPRESSION: 1. Mild heterogeneity of liver echotexture and intrahepatic biliary
ductal dilatation. Tenderness over liver with probe pressure.
Correlation with liver enzymes is recommended to evaluate for
hepatitis or cholangitis.
2. Normal gallbladder.  No extrahepatic biliary ductal dilatation.

By: Misara Fgg M.D.

## 2019-02-15 DIAGNOSIS — M7502 Adhesive capsulitis of left shoulder: Secondary | ICD-10-CM | POA: Diagnosis not present

## 2019-02-15 DIAGNOSIS — M6281 Muscle weakness (generalized): Secondary | ICD-10-CM | POA: Diagnosis not present

## 2019-02-15 DIAGNOSIS — M25512 Pain in left shoulder: Secondary | ICD-10-CM | POA: Diagnosis not present

## 2019-02-19 DIAGNOSIS — M6281 Muscle weakness (generalized): Secondary | ICD-10-CM | POA: Diagnosis not present

## 2019-02-19 DIAGNOSIS — M25512 Pain in left shoulder: Secondary | ICD-10-CM | POA: Diagnosis not present

## 2019-02-19 DIAGNOSIS — M7502 Adhesive capsulitis of left shoulder: Secondary | ICD-10-CM | POA: Diagnosis not present

## 2019-02-21 DIAGNOSIS — M7502 Adhesive capsulitis of left shoulder: Secondary | ICD-10-CM | POA: Diagnosis not present

## 2019-02-21 DIAGNOSIS — M25512 Pain in left shoulder: Secondary | ICD-10-CM | POA: Diagnosis not present

## 2019-02-21 DIAGNOSIS — M6281 Muscle weakness (generalized): Secondary | ICD-10-CM | POA: Diagnosis not present

## 2019-02-26 DIAGNOSIS — M7502 Adhesive capsulitis of left shoulder: Secondary | ICD-10-CM | POA: Diagnosis not present

## 2019-02-26 DIAGNOSIS — M25512 Pain in left shoulder: Secondary | ICD-10-CM | POA: Diagnosis not present

## 2019-02-26 DIAGNOSIS — M6281 Muscle weakness (generalized): Secondary | ICD-10-CM | POA: Diagnosis not present

## 2019-02-27 ENCOUNTER — Ambulatory Visit: Payer: Medicare Other

## 2019-02-28 DIAGNOSIS — M47816 Spondylosis without myelopathy or radiculopathy, lumbar region: Secondary | ICD-10-CM | POA: Diagnosis not present

## 2019-02-28 DIAGNOSIS — M5136 Other intervertebral disc degeneration, lumbar region: Secondary | ICD-10-CM | POA: Diagnosis not present

## 2019-02-28 DIAGNOSIS — M6281 Muscle weakness (generalized): Secondary | ICD-10-CM | POA: Diagnosis not present

## 2019-02-28 DIAGNOSIS — M75101 Unspecified rotator cuff tear or rupture of right shoulder, not specified as traumatic: Secondary | ICD-10-CM | POA: Diagnosis not present

## 2019-02-28 DIAGNOSIS — M7502 Adhesive capsulitis of left shoulder: Secondary | ICD-10-CM | POA: Diagnosis not present

## 2019-02-28 DIAGNOSIS — G894 Chronic pain syndrome: Secondary | ICD-10-CM | POA: Diagnosis not present

## 2019-02-28 DIAGNOSIS — F4325 Adjustment disorder with mixed disturbance of emotions and conduct: Secondary | ICD-10-CM | POA: Diagnosis not present

## 2019-02-28 DIAGNOSIS — M25512 Pain in left shoulder: Secondary | ICD-10-CM | POA: Diagnosis not present

## 2019-03-05 DIAGNOSIS — M6281 Muscle weakness (generalized): Secondary | ICD-10-CM | POA: Diagnosis not present

## 2019-03-05 DIAGNOSIS — M25512 Pain in left shoulder: Secondary | ICD-10-CM | POA: Diagnosis not present

## 2019-03-05 DIAGNOSIS — M7502 Adhesive capsulitis of left shoulder: Secondary | ICD-10-CM | POA: Diagnosis not present

## 2019-03-07 DIAGNOSIS — M25512 Pain in left shoulder: Secondary | ICD-10-CM | POA: Diagnosis not present

## 2019-03-07 DIAGNOSIS — M7502 Adhesive capsulitis of left shoulder: Secondary | ICD-10-CM | POA: Diagnosis not present

## 2019-03-07 DIAGNOSIS — M6281 Muscle weakness (generalized): Secondary | ICD-10-CM | POA: Diagnosis not present

## 2019-03-11 ENCOUNTER — Ambulatory Visit: Payer: Medicare Other | Admitting: Family Medicine

## 2019-03-12 ENCOUNTER — Ambulatory Visit: Payer: Medicare Other | Admitting: Pulmonary Disease

## 2019-03-12 DIAGNOSIS — M6281 Muscle weakness (generalized): Secondary | ICD-10-CM | POA: Diagnosis not present

## 2019-03-12 DIAGNOSIS — M25512 Pain in left shoulder: Secondary | ICD-10-CM | POA: Diagnosis not present

## 2019-03-12 DIAGNOSIS — M7502 Adhesive capsulitis of left shoulder: Secondary | ICD-10-CM | POA: Diagnosis not present

## 2019-03-12 DIAGNOSIS — F4325 Adjustment disorder with mixed disturbance of emotions and conduct: Secondary | ICD-10-CM | POA: Diagnosis not present

## 2019-03-14 DIAGNOSIS — M6281 Muscle weakness (generalized): Secondary | ICD-10-CM | POA: Diagnosis not present

## 2019-03-14 DIAGNOSIS — M7502 Adhesive capsulitis of left shoulder: Secondary | ICD-10-CM | POA: Diagnosis not present

## 2019-03-14 DIAGNOSIS — M25512 Pain in left shoulder: Secondary | ICD-10-CM | POA: Diagnosis not present

## 2019-03-21 DIAGNOSIS — M6281 Muscle weakness (generalized): Secondary | ICD-10-CM | POA: Diagnosis not present

## 2019-03-21 DIAGNOSIS — M7502 Adhesive capsulitis of left shoulder: Secondary | ICD-10-CM | POA: Diagnosis not present

## 2019-03-21 DIAGNOSIS — M25512 Pain in left shoulder: Secondary | ICD-10-CM | POA: Diagnosis not present

## 2019-03-27 DIAGNOSIS — M25512 Pain in left shoulder: Secondary | ICD-10-CM | POA: Diagnosis not present

## 2019-03-27 DIAGNOSIS — M6281 Muscle weakness (generalized): Secondary | ICD-10-CM | POA: Diagnosis not present

## 2019-03-27 DIAGNOSIS — M7502 Adhesive capsulitis of left shoulder: Secondary | ICD-10-CM | POA: Diagnosis not present

## 2019-03-29 DIAGNOSIS — M25512 Pain in left shoulder: Secondary | ICD-10-CM | POA: Diagnosis not present

## 2019-03-29 DIAGNOSIS — M7502 Adhesive capsulitis of left shoulder: Secondary | ICD-10-CM | POA: Diagnosis not present

## 2019-03-29 DIAGNOSIS — M6281 Muscle weakness (generalized): Secondary | ICD-10-CM | POA: Diagnosis not present

## 2019-04-02 DIAGNOSIS — M6281 Muscle weakness (generalized): Secondary | ICD-10-CM | POA: Diagnosis not present

## 2019-04-02 DIAGNOSIS — M25512 Pain in left shoulder: Secondary | ICD-10-CM | POA: Diagnosis not present

## 2019-04-02 DIAGNOSIS — M7502 Adhesive capsulitis of left shoulder: Secondary | ICD-10-CM | POA: Diagnosis not present

## 2019-04-04 DIAGNOSIS — M25512 Pain in left shoulder: Secondary | ICD-10-CM | POA: Diagnosis not present

## 2019-04-04 DIAGNOSIS — M6281 Muscle weakness (generalized): Secondary | ICD-10-CM | POA: Diagnosis not present

## 2019-04-04 DIAGNOSIS — M7502 Adhesive capsulitis of left shoulder: Secondary | ICD-10-CM | POA: Diagnosis not present

## 2019-04-05 DIAGNOSIS — G500 Trigeminal neuralgia: Secondary | ICD-10-CM | POA: Diagnosis not present

## 2019-04-05 DIAGNOSIS — S43431A Superior glenoid labrum lesion of right shoulder, initial encounter: Secondary | ICD-10-CM | POA: Diagnosis not present

## 2019-04-05 DIAGNOSIS — G894 Chronic pain syndrome: Secondary | ICD-10-CM | POA: Diagnosis not present

## 2019-04-05 DIAGNOSIS — M25519 Pain in unspecified shoulder: Secondary | ICD-10-CM | POA: Diagnosis not present

## 2019-04-05 DIAGNOSIS — Z79899 Other long term (current) drug therapy: Secondary | ICD-10-CM | POA: Diagnosis not present

## 2019-04-05 DIAGNOSIS — Z79891 Long term (current) use of opiate analgesic: Secondary | ICD-10-CM | POA: Diagnosis not present

## 2019-04-09 DIAGNOSIS — F4325 Adjustment disorder with mixed disturbance of emotions and conduct: Secondary | ICD-10-CM | POA: Diagnosis not present

## 2019-04-10 DIAGNOSIS — M25512 Pain in left shoulder: Secondary | ICD-10-CM | POA: Diagnosis not present

## 2019-04-10 DIAGNOSIS — M6281 Muscle weakness (generalized): Secondary | ICD-10-CM | POA: Diagnosis not present

## 2019-04-10 DIAGNOSIS — M7502 Adhesive capsulitis of left shoulder: Secondary | ICD-10-CM | POA: Diagnosis not present

## 2019-04-12 DIAGNOSIS — M6281 Muscle weakness (generalized): Secondary | ICD-10-CM | POA: Diagnosis not present

## 2019-04-12 DIAGNOSIS — M25512 Pain in left shoulder: Secondary | ICD-10-CM | POA: Diagnosis not present

## 2019-04-12 DIAGNOSIS — M7502 Adhesive capsulitis of left shoulder: Secondary | ICD-10-CM | POA: Diagnosis not present

## 2019-04-17 ENCOUNTER — Ambulatory Visit: Payer: Medicare Other

## 2019-04-17 DIAGNOSIS — M25512 Pain in left shoulder: Secondary | ICD-10-CM | POA: Diagnosis not present

## 2019-04-17 DIAGNOSIS — M6281 Muscle weakness (generalized): Secondary | ICD-10-CM | POA: Diagnosis not present

## 2019-04-17 DIAGNOSIS — M7502 Adhesive capsulitis of left shoulder: Secondary | ICD-10-CM | POA: Diagnosis not present

## 2019-04-19 ENCOUNTER — Emergency Department (HOSPITAL_BASED_OUTPATIENT_CLINIC_OR_DEPARTMENT_OTHER): Payer: Medicare Other

## 2019-04-19 ENCOUNTER — Encounter (HOSPITAL_BASED_OUTPATIENT_CLINIC_OR_DEPARTMENT_OTHER): Payer: Self-pay | Admitting: *Deleted

## 2019-04-19 ENCOUNTER — Emergency Department (HOSPITAL_BASED_OUTPATIENT_CLINIC_OR_DEPARTMENT_OTHER)
Admission: EM | Admit: 2019-04-19 | Discharge: 2019-04-19 | Disposition: A | Discharge status: Home / Self Care | Payer: Medicare Other | Attending: Emergency Medicine | Admitting: Emergency Medicine

## 2019-04-19 ENCOUNTER — Telehealth: Payer: Self-pay | Admitting: Neurology

## 2019-04-19 ENCOUNTER — Other Ambulatory Visit: Payer: Self-pay

## 2019-04-19 DIAGNOSIS — Y9301 Activity, walking, marching and hiking: Secondary | ICD-10-CM | POA: Insufficient documentation

## 2019-04-19 DIAGNOSIS — W010XXA Fall on same level from slipping, tripping and stumbling without subsequent striking against object, initial encounter: Secondary | ICD-10-CM | POA: Diagnosis not present

## 2019-04-19 DIAGNOSIS — Y92828 Other wilderness area as the place of occurrence of the external cause: Secondary | ICD-10-CM | POA: Insufficient documentation

## 2019-04-19 DIAGNOSIS — M545 Low back pain: Secondary | ICD-10-CM | POA: Diagnosis not present

## 2019-04-19 DIAGNOSIS — S0990XA Unspecified injury of head, initial encounter: Secondary | ICD-10-CM | POA: Insufficient documentation

## 2019-04-19 DIAGNOSIS — Y998 Other external cause status: Secondary | ICD-10-CM | POA: Insufficient documentation

## 2019-04-19 DIAGNOSIS — M25512 Pain in left shoulder: Secondary | ICD-10-CM | POA: Diagnosis not present

## 2019-04-19 DIAGNOSIS — M6281 Muscle weakness (generalized): Secondary | ICD-10-CM | POA: Diagnosis not present

## 2019-04-19 DIAGNOSIS — S0081XA Abrasion of other part of head, initial encounter: Secondary | ICD-10-CM | POA: Insufficient documentation

## 2019-04-19 DIAGNOSIS — W19XXXA Unspecified fall, initial encounter: Secondary | ICD-10-CM

## 2019-04-19 DIAGNOSIS — M9904 Segmental and somatic dysfunction of sacral region: Secondary | ICD-10-CM | POA: Diagnosis not present

## 2019-04-19 DIAGNOSIS — M7502 Adhesive capsulitis of left shoulder: Secondary | ICD-10-CM | POA: Diagnosis not present

## 2019-04-19 MED ORDER — ACETAMINOPHEN 500 MG PO TABS
1000.0000 mg | ORAL_TABLET | Freq: Once | ORAL | Status: AC
  Administered 2019-04-19: 1000 mg via ORAL
  Filled 2019-04-19: qty 2

## 2019-04-19 NOTE — Telephone Encounter (Signed)
Patient fell down this AM walking on trail. Hit head, chin on the concrete. Now with scraps and bruising on face. No blackout. Had some dizziness. Now more pain with occipital neuralgia.   Recommend to consider ER evaluation if pain level is very severe. Otherwise, may increase gabapentin 300mg  to 4-6 tabs per day. Also patient requesting occipital nerve block next week. Will forward to Dr. Jaynee Eagles and team to reach out to patient on Monday.    Penni Bombard, MD [id number], [id number] AM Certified in Neurology, Neurophysiology and Neuroimaging  [facility] [address] [place], [place] [address] [phone]

## 2019-04-19 NOTE — Discharge Instructions (Signed)
You were examined today for a head injury and possible concussion.  Your head CT showed no evidence of  Injury today.   Sometimes serious problems can develop after a head injury. Please return to the emergency department if you experience any of the following symptoms: Repeated vomiting Headache that gets worse and does not go away Loss of consciousness or inability to stay awake at times when you   normally would be able to Getting more confused, restless or agitated Convulsions or seizures Difficulty walking or feeling off balance Weakness or numbness Vision changes A concussion is a very mild traumatic brain injury caused by a bump, jolt or blow to the head, most people recover quickly and fully. You can experience a wide variety of symptoms including:   - Confusion      - Difficulty concentrating       - Trouble remembering new info  - Headache      - Dizziness        - Fuzzy or blurry vision  - Fatigue      - Balance problems      - Light sensitivity  - Mood swings     - Changes in sleep or difficulty sleeping   To help these symptoms improve make sure you are getting plenty of rest, avoid screen time, loud music and strenuous mental activities. Avoid any strenuous physical activities, once your symptoms have resolved a slow and gradual return to activity is recommended. It is very important that you avoid situations in which you might sustain a second head injury as this can be very dangerous and life threatening. You cannot be medically cleared to return to normal activities until you have followed up with your primary doctor or a concussion specialist for reevaluation.  

## 2019-04-19 NOTE — ED Notes (Signed)
triped while walking on trail  Abrasion to rt side side of face  No loc

## 2019-04-19 NOTE — ED Provider Notes (Signed)
[facility] EMERGENCY DEPARTMENT Provider Note   CSN: [id number] Arrival date & time: 04/19/19  1343     History   Chief Complaint Chief Complaint  Patient presents with   Fall    HPI Cassidy Olsen is a 58 y.o. female.     Cassidy Olsen is a 58 y.o. female with a history of migraines, occipital neuralgia, IBS, hyperlipidemia, GERD, recent rotator cuff surgery, and back surgery, who presents to the ED today for evaluation after head injury.  She reports about 4 hours prior to arrival she was walking on a trail and tripped landing forward and striking her face on the concrete and dirt.  She reports that she did not have any loss of consciousness but afterwards developed some dizziness and headache.  She has not had any vision changes, no nausea or vomiting.  No numbness, weakness or tingling.  She denies any other injuries from the fall aside from some abrasions to her chin and right cheek.  Denies any neck or back pain.  Reports that her arms went out in front of her but she did not catch herself or put pressure or weight on her right shoulder where she recently had rotator cuff repair.  Went to doctors appointments today and they recommended she come to get checked out.  She has not had any new or worsening symptoms throughout the day since her fall.  Has not taken anything for headache prior to arrival.  No other aggravating or alleviating factors.     Past Medical History:  Diagnosis Date   Asthma    Chronic pain    Family history of breast cancer    Family history of prostate cancer    GERD (gastroesophageal reflux disease)    High cholesterol    IBS (irritable bowel syndrome)    Migraine    Occipital neuralgia    Osteoarthritis    Spinal stenosis    Trigeminal neuralgia    Trigeminal neuralgia     Patient Active Problem List   Diagnosis Date Noted   Excessive daytime sleepiness 01/08/2019   Upper airway cough syndrome 11/12/2018   Acute  bacterial sinusitis 07/17/2018   Asthma 07/17/2018   Abnormal chest CT 07/17/2018   Trigeminal neuralgia of right side of face 02/01/2018   Occipital neuralgia 02/01/2018   Chronic migraine without aura without status migrainosus, not intractable 02/01/2018   Family history of breast cancer    Family history of prostate cancer     Past Surgical History:  Procedure Laterality Date   ABDOMINAL HYSTERECTOMY     ANKLE SURGERY Left 05/2009   APPENDECTOMY     BACK SURGERY     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 Pershing Proud at [facility]  2014   JOINT REPLACEMENT     R knee   OVARIAN CYST REMOVAL  05/2010   right knee revision  2016   ROTATOR CUFF REPAIR       OB History    Gravida  3   Para  1   Term      Preterm      AB  2   Living  1     SAB  1   TAB  1   Ectopic      Multiple      Live Births  Home Medications    Prior to Admission medications   Medication Sig Start Date End Date Taking? Authorizing Provider  HYDROcodone-acetaminophen (NORCO/VICODIN) 5-325 MG tablet  01/04/19  Yes [provider]  amitriptyline (ELAVIL) 50 MG tablet Take 50 mg by mouth at bedtime.    [provider]  baclofen (LIORESAL) 10 MG tablet Take 10 mg by mouth daily.     [provider]  budesonide-formoterol (SYMBICORT) 80-4.5 MCG/ACT inhaler Inhale 2 puffs into the lungs every 12 (twelve) hours. 05/30/18   Garner Nash, DO  celecoxib (CELEBREX) 200 MG capsule Take 200 mg by mouth daily.     [provider]  clotrimazole (MYCELEX) 10 MG troche Take 1 tablet (10 mg total) by mouth 4 (four) times daily. 12/13/18   Tanda Rockers, MD  dexlansoprazole (DEXILANT) 60 MG capsule Take 60 mg by mouth daily.    [provider]  fenofibrate (TRICOR) 145 MG tablet Take by mouth.    [provider]  gabapentin  (NEURONTIN) 300 MG capsule Take 1 capsule (300 mg total) by mouth 3 (three) times daily. 10/30/18   Lomax, Amy, NP  lubiprostone (AMITIZA) 24 MCG capsule Take 24 mcg by mouth 2 (two) times daily with a meal.    [provider]  Melatonin 10 MG TABS Take by mouth.    [provider]  methocarbamol (ROBAXIN) 500 MG tablet TAKE 1 TABLET BY MOUTH EVERY 6 TO 8 HOURS AS NEEDED FOR SPASMS FOR 10 DAYS 02/11/19   [provider]  montelukast (SINGULAIR) 10 MG tablet Take 1 tablet (10 mg total) by mouth at bedtime. 04/18/18   Icard, Octavio Graves, DO  nystatin (MYCOSTATIN) 100000 UNIT/ML suspension Take 5 mLs (500,000 Units total) by mouth 3 (three) times daily. 11/05/18   Parrett, Fonnie Mu, NP  oxyCODONE-acetaminophen (PERCOCET/ROXICET) 5-325 MG tablet Take 1 tablet by mouth every 6 (six) hours. 02/26/19   [provider]  Simethicone 180 MG CAPS Take 1 capsule (180 mg total) by mouth 2 (two) times daily. 06/15/18   Starr Lake, CNM  Spacer/Aero-Holding Chambers (AEROCHAMBER MV) inhaler Use as instructed 04/18/18   Icard, Octavio Graves, DO  tapentadol (NUCYNTA ER) 100 MG 12 hr tablet Take 100 mg by mouth every 12 (twelve) hours.    [provider]  topiramate (TOPAMAX) 100 MG tablet Take 100 mg by mouth 2 (two) times daily.    [provider]  traMADol (ULTRAM) 50 MG tablet Take 1 tablet (50 mg total) by mouth every 4 (four) hours as needed (cough / sore throat). 12/13/18   Tanda Rockers, MD  valACYclovir (VALTREX) 500 MG tablet Take 500 mg by mouth daily.    [provider]    Family History Family History  Problem Relation Age of Onset   Ulcers Mother 46       peptic ulcer   Prostate cancer Father 55   Breast cancer Sister 54       ER+/PR+/Her2- breast cancer   Breast cancer Paternal Grandmother    Diabetes Paternal Grandmother    Cancer Paternal Uncle        unknown cancers    Social History Social History   Tobacco Use    Smoking status: Never Smoker   Smokeless tobacco: Never Used  Substance Use Topics   Alcohol use: Yes    Comment: socially   Drug use: No     Allergies   Aspirin, Codeine, Ibuprofen, Latex, and Penicillins   Review of  Systems Review of Systems  Constitutional: Negative for chills and fever.  HENT: Negative for dental problem and facial swelling.   Eyes: Negative for visual disturbance.  Respiratory: Negative for shortness of breath.   Cardiovascular: Negative for chest pain.  Gastrointestinal: Negative for abdominal pain, nausea and vomiting.  Musculoskeletal: Negative for arthralgias, back pain and neck pain.  Skin: Positive for wound.  Neurological: Positive for dizziness and headaches. Negative for syncope, facial asymmetry, weakness, light-headedness and numbness.     Physical Exam Updated Vital Signs BP (!) 147/101 (BP Location: Left Arm)    Pulse (!) 108    Temp 98.8 F (37.1 C) (Oral)    Resp 18    Ht 5' 3" (1.6 m)    Wt 72.1 kg    SpO2 100%    BMI 28.17 kg/m   Physical Exam Vitals signs and nursing note reviewed.  Constitutional:      General: She is not in acute distress.    Appearance: Normal appearance. She is well-developed and normal weight. She is not ill-appearing or diaphoretic.  HENT:     Head: Normocephalic.     Comments: Patient with a small abrasion to the chin and the right cheek, no palpable bony deformity beneath these, no malocclusion of the jaw, no palpable hematoma, step-off or deformity over the forehead or scalp, negative battle sign, no raccoon eyes.  No CSF otorrhea or hemotympanum    Nose: Nose normal.     Mouth/Throat:     Mouth: Mucous membranes are moist.     Pharynx: Oropharynx is clear.  Eyes:     General:        Right eye: No discharge.        Left eye: No discharge.     Extraocular Movements: Extraocular movements intact.     Conjunctiva/sclera: Conjunctivae normal.     Pupils: Pupils are equal, round, and reactive to light.    Neck:     Musculoskeletal: Neck supple.     Comments: C-spine nontender to palpation Cardiovascular:     Rate and Rhythm: Normal rate and regular rhythm.  Musculoskeletal:        General: No deformity.     Comments: All joints supple and easily movable, all compartments soft.  No midline thoracic or lumbar tenderness.  Skin:    General: Skin is warm and dry.     Capillary Refill: Capillary refill takes less than 2 seconds.  Neurological:     Mental Status: She is alert.     Coordination: Coordination normal.     Comments: Speech is clear, able to follow commands CN III-XII intact Normal strength in upper and lower extremities bilaterally including dorsiflexion and plantar flexion, strong and equal grip strength Sensation normal to light and sharp touch Moves extremities without ataxia, coordination intact Normal finger to nose and rapid alternating movements No pronator drift  Psychiatric:        Mood and Affect: Mood normal.        Behavior: Behavior normal.      ED Treatments / Results  Labs (all labs ordered are listed, but only abnormal results are displayed) Labs Reviewed - No data to display  EKG None  Radiology Ct Head Wo Contrast  Result Date: 04/19/2019 CLINICAL DATA:  Fall, head trauma, abrasion to right side of face EXAM: CT HEAD WITHOUT CONTRAST CT MAXILLOFACIAL WITHOUT CONTRAST TECHNIQUE: Multidetector CT imaging of the head and maxillofacial structures were performed using the standard protocol without intravenous contrast.  Multiplanar CT image reconstructions of the maxillofacial structures were also generated. COMPARISON:  12/20/2018 FINDINGS: CT HEAD FINDINGS Brain: No evidence of acute infarction, hemorrhage, hydrocephalus, extra-axial collection or mass lesion/mass effect. Vascular: No hyperdense vessel or unexpected calcification. Skull: Redemonstrated right occipital craniotomy. Negative for fracture or focal lesion. Other: None. CT MAXILLOFACIAL FINDINGS  Osseous: No fracture or mandibular dislocation. No destructive process. Orbits: Negative. No traumatic or inflammatory finding. Sinuses: Clear. Soft tissues: Negative. IMPRESSION: 1.  No acute intracranial pathology. 2.  No displaced fracture or dislocation of the facial bones. 3. Redemonstrated right occipital craniotomy. Electronically Signed   By: Eddie Candle M.D.   On: 04/19/2019 14:51   Ct Maxillofacial Wo Contrast  Result Date: 04/19/2019 CLINICAL DATA:  Fall, head trauma, abrasion to right side of face EXAM: CT HEAD WITHOUT CONTRAST CT MAXILLOFACIAL WITHOUT CONTRAST TECHNIQUE: Multidetector CT imaging of the head and maxillofacial structures were performed using the standard protocol without intravenous contrast. Multiplanar CT image reconstructions of the maxillofacial structures were also generated. COMPARISON:  12/20/2018 FINDINGS: CT HEAD FINDINGS Brain: No evidence of acute infarction, hemorrhage, hydrocephalus, extra-axial collection or mass lesion/mass effect. Vascular: No hyperdense vessel or unexpected calcification. Skull: Redemonstrated right occipital craniotomy. Negative for fracture or focal lesion. Other: None. CT MAXILLOFACIAL FINDINGS Osseous: No fracture or mandibular dislocation. No destructive process. Orbits: Negative. No traumatic or inflammatory finding. Sinuses: Clear. Soft tissues: Negative. IMPRESSION: 1.  No acute intracranial pathology. 2.  No displaced fracture or dislocation of the facial bones. 3. Redemonstrated right occipital craniotomy. Electronically Signed   By: Eddie Candle M.D.   On: 04/19/2019 14:51    Procedures Procedures (including critical care time)  Medications Ordered in ED Medications  acetaminophen (TYLENOL) tablet 1,000 mg (1,000 mg Oral Given 04/19/19 1438)     Initial Impression / Assessment and Plan / ED Course  I have reviewed the triage vital signs and the nursing notes.  Pertinent labs & imaging results that were available during my  care of the patient were reviewed by me and considered in my medical decision making (see chart for details).  58 year old female presents for evaluation of head injury.  While walking on a trail she tripped falling forward and striking her face on the ground.  She had no LOC, reports headache and intermittent dizziness, but on arrival she has no focal neurologic deficits.  Small abrasions to the right cheek and right jaw but no larger lacerations to the face requiring repair.  Tetanus is up-to-date.  He does not have any malocclusion of the jaw but does have some tenderness over the chin.  Will get CT head and CT maxillofacial.  Suspect concussion.  CT scan showed no acute intracranial pathology and no fracture or dislocation of the facial bones.  I discussed reassuring imaging with the patient.  Suspect mild concussion, discussed Tylenol as needed for headaches, gave head injury precautions and recommend follow-up with PCP or neurologist.  Patient expresses understanding and agreement with plan.  Discharged home in good condition.  Final Clinical Impressions(s) / ED Diagnoses   Final diagnoses:  Fall, initial encounter  Injury of head, initial encounter  Abrasion of face, initial encounter    ED Discharge Orders    None       Janet Berlin 04/19/19 1742    Isla Pence, MD 04/21/19 [phone]

## 2019-04-19 NOTE — Telephone Encounter (Signed)
Pt called stating that she has fallen and hit her head. Pt states that she is in pain due to her occipital neuralgia. Pt was advised to go to the ED to be checked out and the pt refuses to go and states "they can not do anything for me, I have had this for years and trust me they will not be able to help me". Pt insisted that she wanted to speak to her provider and she was informed that the office is closed on Friday's due to the Covid pandemic and the pt started to argue that I did not need to explain to her what the Covid was she knows what it is. I informed her that I was not trying to explain the pandemic to her I was just informing her that the office is not open on Friday's since the Pandemic started. Pt continued to argue and I proceeded to change the subject and ask her if she would like for me to take a message for the on call provider and Pt stated "well I need to be treated so yes take the message and get them to call me".

## 2019-04-19 NOTE — ED Triage Notes (Signed)
C/o fall x 4 hrs ago landing on cement with head injury , abrasion to right face .

## 2019-04-20 NOTE — Telephone Encounter (Signed)
Cassidy Olsen, Since I am only in the office 2 days next week(maybe even less pending on my recovery) I can't perform the nerve blocks because as you know we also have a several other urgent requests as well as other nerve block requests still needing to be squeezed in. We can check to see if anyone else has opening like Amy, Jinny Blossom or Judson Roch otherwise patient will have to wait until the following week or two. thanks

## 2019-04-22 NOTE — Telephone Encounter (Signed)
Spoke with pt and offered occipital nerve block with NP tomorrow. She was scheduled for 1:00 pm with Amy. She stated the pain is a little better with the Gabapentin but she did hit her head hard when she fell and she would appreciate the nerve block. Pt understands to bring a mask. She also understands temp will be taken at door along with being asked a few [facility] screening questions. She verbalized appreciation for the call.

## 2019-04-23 ENCOUNTER — Other Ambulatory Visit: Payer: Self-pay

## 2019-04-23 ENCOUNTER — Ambulatory Visit (INDEPENDENT_AMBULATORY_CARE_PROVIDER_SITE_OTHER): Payer: Medicare Other | Admitting: Family Medicine

## 2019-04-23 ENCOUNTER — Other Ambulatory Visit: Payer: Self-pay | Admitting: Adult Health

## 2019-04-23 ENCOUNTER — Encounter: Payer: Self-pay | Admitting: Family Medicine

## 2019-04-23 VITALS — BP 128/88 | HR 98 | Temp 97.8°F | Ht 63.0 in | Wt 160.4 lb

## 2019-04-23 DIAGNOSIS — M25512 Pain in left shoulder: Secondary | ICD-10-CM | POA: Diagnosis not present

## 2019-04-23 DIAGNOSIS — G43709 Chronic migraine without aura, not intractable, without status migrainosus: Secondary | ICD-10-CM

## 2019-04-23 DIAGNOSIS — M5481 Occipital neuralgia: Secondary | ICD-10-CM

## 2019-04-23 DIAGNOSIS — M7502 Adhesive capsulitis of left shoulder: Secondary | ICD-10-CM | POA: Diagnosis not present

## 2019-04-23 DIAGNOSIS — M6281 Muscle weakness (generalized): Secondary | ICD-10-CM | POA: Diagnosis not present

## 2019-04-23 NOTE — Progress Notes (Addendum)
History: see office note        Bupivicaine injection/Lidocaine protocol for occipital neuralgia   Performed by Debbora Presto, FNP.30-gauge needle was used. All procedures as documented were medically necessary, reasonable and appropriate based on the patient's history, medical diagnosis and physician opinion. Verbal informed consent was obtained from the patient, patient was informed of potential risk of procedure, including bruising, bleeding, hematoma formation, infection, muscle weakness, muscle pain, numbness, transient hypertension, transient hyperglycemia and transient insomnia among others. All areas injected were topically clean with isopropyl rubbing alcohol. Nonsterile nonlatex gloves were worn during the procedure.   1. Greater occipital nerve block [phone]). The greater occipital nerve site was identified at the nuchal line medial to the occipital artery. Medication was injected into the left and right occipital nerve areas and suboccipital areas. Patient's condition is associated with inflammation of the greater occipital nerve and associated multiple groups. Injection was deemed medically necessary, reasonable and appropriate. Injection represents a separate and unique surgical service.     The patient tolerated the injections well, no complications of the procedure were noted. Injections were made with a 30-gauge needle.   Made any corrections needed, and agree procedure.  Sarina Ill, MD [facility]

## 2019-04-23 NOTE — Progress Notes (Signed)
PATIENT: Cassidy Olsen DOB: Jul 04, 1961  REASON FOR VISIT: follow up HISTORY FROM: patient  Chief Complaint  Patient presents with   Follow-up    3 mon f/u. Alone. New room. No new concerns at this time.      HISTORY OF PRESENT ILLNESS: Today 04/23/19 Cassidy Olsen is a 58 y.o. female here today for follow up for migraines and occipital neuralgia. She had a fall last week striking her chin on the ground. ER eval was unremarkable. CT was normal. She was advised by on call neurologist to increase gabapentin to 321m 4-6 times daily. She reports taking 6031maround 10am, 30090mt 4pm and 600m63m bedtime. She does feel that this has helped. She is requesting a nerve block today as the pain has persisted in the occipital region of her head. It is similar to previous occipital neuralgic pain. Sleep study was normal.    HISTORY: (copied from my note on 01/08/2019)  Cassidy Olsen 58 y75. female for follow up of occipital neuralgia and migraines. She was restarted on gabapentin after being titrated down in March. She reports that gabapentin 300mg30mee times a day is very helpful for her trigeminal and occipital neuralgia. She continues to have frequent pain.  She is being treated with Vicodin twice daily for shoulder pain.  She is anticipating upcoming surgery.  She states that about a month ago, she started having regular headaches.  She reports that the headaches are tension type headache behind both eyes.  She does note these headaches in the morning fairly often.  She denies any worsening of headaches with position changes.  She has not noted any specific vision disturbance but states that she feels she needs an eye exam for updated prescription glasses.  She states that her headaches are nearly daily.  They are pounding on occasion.  She denies any light or sound sensitivity, however does have nausea from time to time.  Lying down in a dark room does help.  She is uncertain if she  snores.  She lives alone.  She does endorse a dry mouth every day.  She does have frequent morning headaches.  She does endorse excessive daytime sleepiness.  She states that there are times where she will fall asleep at her desk or in a chair.  She usually goes to bed around 10 PM and wakes around 8 AM but states that she can be up all hours of the night.  She has never had a sleep study.  She reports having an MRI in the past that was normal.  She occasionally takes an Aleve for headaches but denies regular use.  She is taking topiramate 100 mg by mouth twice daily.  She is also on amitriptyline 50 mg at night.  She takes baclofen for shoulder and back pain.   History (copied from Dr AhernCathren Laines on 02/01/2018)  HPI:Cassidy Olsen a 57 y.94femalehere as a referral from Dr. VaradEverlene Farriereminal neuralgia.She has a past medical history spinal stenosis, osteoarthritis, irritable bowel syndrome, high cholesterol, chronic pain, occipital neuralgia, lumbar degenerative disc disease, lumbar spondylosis, failed back surgical syndrome, low back pain,she is managed for her pain by a pain management clinic. She is on NabuMetone, oxycodone, lidocaine, gabapentin, amitriptyline, baclofen, topiramate, celexaand Nucynta. She has had trigeminal decompression in the past. She developed right-sided trigeminal neuralgia in 2005, prior to that the occipital neuralgia. [facility] They have had her on "  everything" she had etensive testing. MRIs, CTs, MRAs and diagnosed with occipital neuralgia. She then developed Trigeminal neuralgia and say a NSY and found blood vessel compressing the trigeminal nerve and she had decompressive surgery, she had MRIs of the trigeminal nerve. She can feel keys. Her baseline level pain is a 4+, baseline is a 4/10 daily and she is at a 6 now since I am typing. She gets regular occipital  blocks. She has also been seen at [facility]. She also saw doctors at [facility]. She has had sphenopalatine blocks. She has taken multiple medications including Tegretol, topamax, lyrica, neurontin, she was on about 18 medications a day. She is currently on multiple medications now.   Currently she gets nagging pain continuously on the right, movement makes it worse, she has shooting pains, spasms radiating up the occipital head, she also shooting pain down the face, exacerbated by wind, shooting pain across the face and eye, the cheek. Worsening since March. Now having left-side symptoms. Light and sounds can trigger headaches. A dark room helps. She has significant light sensitivity, sound sensitivity, movement makes it worse, no nausea or vomiting.  Reviewed notes, labs and imaging from outside physicians, which showed:  Review of records she is had a nerve block of the greater occipital nerve. She has a history of occipital neuralgia as well as right trigeminal neuralgia. She has undergone decompressive surgery in [place] for the trigeminal neuralgia. The occipital neuralgia has responded well to greater occipital nerve injections in the past. She does not have a local neurologist. Pain is a 7 out of 10.  Will try to request imaging from institutions she mentions   REVIEW OF SYSTEMS: Out of a complete 14 system review of symptoms, the patient complains only of the following symptoms, headaches and all other reviewed systems are negative.   ALLERGIES: Allergies  Allergen Reactions   Aspirin    Codeine     Cough meds with codeine-have withdraw symptoms when done   Ibuprofen    Latex    Penicillins     HOME MEDICATIONS: Outpatient Medications Prior to Visit  Medication Sig Dispense Refill   acetaminophen (TYLENOL) 325 MG tablet Take 650 mg by mouth daily.     amitriptyline (ELAVIL) 50 MG tablet Take 50 mg by mouth at bedtime.     baclofen (LIORESAL) 10 MG tablet Take 10 mg  by mouth daily.      budesonide-formoterol (SYMBICORT) 80-4.5 MCG/ACT inhaler Inhale 2 puffs into the lungs every 12 (twelve) hours. 1 Inhaler 5   celecoxib (CELEBREX) 200 MG capsule Take 200 mg by mouth daily.      dexlansoprazole (DEXILANT) 60 MG capsule Take 60 mg by mouth daily.     fenofibrate (TRICOR) 145 MG tablet Take by mouth.     gabapentin (NEURONTIN) 300 MG capsule Take 1 capsule (300 mg total) by mouth 3 (three) times daily. (Patient taking differently: Take 300 mg by mouth 4 (four) times daily. ) 90 capsule 11   lubiprostone (AMITIZA) 24 MCG capsule Take 24 mcg by mouth 2 (two) times daily with a meal.     Melatonin 10 MG TABS Take by mouth.     montelukast (SINGULAIR) 10 MG tablet Take 1 tablet (10 mg total) by mouth at bedtime. 30 tablet 11   Simethicone 180 MG CAPS Take 1 capsule (180 mg total) by mouth 2 (two) times daily. (Patient taking differently: Take 180 mg by mouth as needed. ) 30 capsule 0   Spacer/Aero-Holding Chambers (  AEROCHAMBER MV) inhaler Use as instructed 1 each 0   tapentadol (NUCYNTA ER) 100 MG 12 hr tablet Take 100 mg by mouth every 12 (twelve) hours.     topiramate (TOPAMAX) 100 MG tablet Take 100 mg by mouth 2 (two) times daily.     valACYclovir (VALTREX) 500 MG tablet Take 500 mg by mouth as needed.      clotrimazole (MYCELEX) 10 MG troche Take 1 tablet (10 mg total) by mouth 4 (four) times daily. (Patient not taking: Reported on 04/23/2019) 16 tablet 0   HYDROcodone-acetaminophen (NORCO/VICODIN) 5-325 MG tablet      methocarbamol (ROBAXIN) 500 MG tablet TAKE 1 TABLET BY MOUTH EVERY 6 TO 8 HOURS AS NEEDED FOR SPASMS FOR 10 DAYS     nystatin (MYCOSTATIN) 100000 UNIT/ML suspension Take 5 mLs (500,000 Units total) by mouth 3 (three) times daily. (Patient not taking: Reported on 04/23/2019) 140 mL 0   oxyCODONE-acetaminophen (PERCOCET/ROXICET) 5-325 MG tablet Take 1 tablet by mouth every 6 (six) hours.     traMADol (ULTRAM) 50 MG tablet Take 1  tablet (50 mg total) by mouth every 4 (four) hours as needed (cough / sore throat). (Patient not taking: Reported on 04/23/2019) 40 tablet 0   No facility-administered medications prior to visit.     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 Pershing Proud at [facility]  2014   JOINT REPLACEMENT     R knee   OVARIAN CYST REMOVAL  05/2010   right knee revision  2016   ROTATOR CUFF REPAIR      FAMILY HISTORY: Family History  Problem Relation Age of Onset   Ulcers Mother 90       peptic ulcer   Prostate cancer Father 83   Breast cancer Sister 1       ER+/PR+/Her2- breast cancer   Breast cancer Paternal Grandmother    Diabetes Paternal Grandmother    Cancer Paternal Uncle        unknown cancers    SOCIAL HISTORY: Social History   Socioeconomic History   Marital status: Single    Spouse name: Not on file   Number of children: 1   Years of education: Not on file   Highest education level: Not on file  Occupational History   Not on file  Social Needs   Financial resource strain: Not on file   Food insecurity    Worry: Not on file    Inability: Not on file   Transportation needs    Medical: Not on file    Non-medical: Not on file  Tobacco Use   Smoking status: Never Smoker   Smokeless tobacco: Never Used    Substance and Sexual Activity  Alcohol use: Yes    Comment: socially   Drug use: No   Sexual activity: Not on file  Lifestyle   Physical activity    Days per week: Not on file    Minutes per session: Not on file   Stress: Not on file  Relationships   Social connections    Talks on phone: Not on file    Gets together: Not on file    Attends religious service: Not on file    Active member of club or organization: Not on file    Attends meetings of clubs or organizations: Not on file    Relationship status: Not on file   Intimate partner violence    Fear of current or ex partner: Not on file    Emotionally abused: Not on file    Physically abused: Not on file    Forced sexual activity: Not on file  Other Topics Concern   Not on file  Social History Narrative   Not on file      PHYSICAL EXAM  Vitals:   04/23/19 1504  BP: 128/88  Pulse: 98  Temp: 97.8 F (36.6 C)  TempSrc: Oral  Weight: 160 lb 6.4 oz (72.8 kg)  Height: '5\' 3"'  (1.6 m)   Body mass index is 28.41 kg/m.  Generalized: Well developed, in no acute distress  Neurological examination  Mentation: Alert oriented to time, place, history taking. Follows all commands speech and language fluent Cranial nerve II-XII: Pupils were equal round reactive to light. Extraocular movements were full, visual field were full on confrontational test. Facial sensation and strength were normal. Uvula tongue midline. Head turning and shoulder shrug  were normal and symmetric. Motor: The motor testing reveals 5 over 5 strength of all 4 extremities. Good symmetric motor tone is noted throughout.  Sensory: Sensory testing is intact to soft touch on all 4 extremities. No evidence of extinction is noted.   Gait and station: Gait is normal.   DIAGNOSTIC DATA (LABS, IMAGING, TESTING) - I reviewed patient records, labs, notes, testing and imaging myself where available.  No flowsheet data found.   Lab Results  Component  Value Date   WBC 8.1 04/18/2018   HGB 12.3 04/18/2018   HCT 36.5 04/18/2018   MCV 92.2 04/18/2018   PLT 429 (H) 04/18/2018   No results found for: NA, K, CL, CO2, GLUCOSE, BUN, CREATININE, CALCIUM, PROT, ALBUMIN, AST, ALT, ALKPHOS, BILITOT, GFRNONAA, GFRAA No results found for: CHOL, HDL, LDLCALC, LDLDIRECT, TRIG, CHOLHDL No results found for: HGBA1C No results found for: VITAMINB12 No results found for: TSH   ASSESSMENT AND PLAN 58 y.o. year old female  has a past medical history of Asthma, Chronic pain, Family history of breast cancer, Family history of prostate cancer, GERD (gastroesophageal reflux disease), High cholesterol, IBS (irritable bowel syndrome), Migraine, Occipital neuralgia, Osteoarthritis, Spinal stenosis, Trigeminal neuralgia, and Trigeminal neuralgia. here with     ICD-10-CM   1. Occipital neuralgia of right side  M54.81   2. Chronic migraine without aura without status migrainosus, not intractable  G43.709     Occipital nerve block performed today. Patient tolerated well and reports improvement in pain prior to check out. We will continue gabapentin (up to 615m three times daily). She will follow up as needed for worsening or unresolved pain. We have discussed referral for RFA if pain persists. She verbalizes understanding and agreement with this plan.    No orders of the defined types were placed in this encounter.  No orders of the defined types were placed in this encounter.     I spent 15 minutes with the patient. 50% of this time was spent counseling and educating patient on plan of care and medications.    Debbora Presto, FNP-C 04/23/2019, 3:07 PM [facility] [address], [place], [place] [address] [phone]

## 2019-04-23 NOTE — Patient Instructions (Signed)
Continue gabapentin as directed (up to 600mg  three times daily)  Will consider referral for radiofrequency ablation if pain continues    Occipital Neuralgia  Occipital neuralgia is a type of headache that causes brief episodes of very bad pain in the back of your head. Pain from occipital neuralgia may spread (radiate) to other parts of your head. These headaches may be caused by irritation of the nerves that leave your spinal cord high up in your neck, just below the base of your skull (occipital nerves). Your occipital nerves transmit sensations from the back of your head, the top of your head, and the areas behind your ears. What are the causes? This condition can occur without any known cause (primary headache syndrome). In other cases, this condition is caused by pressure on or irritation of one of the two occipital nerves. Pressure and irritation may be due to:  Muscle spasm in the neck.  Neck injury.  Wear and tear of the vertebrae in the neck (osteoarthritis).  Disease of the disks that separate the vertebrae.  Swollen blood vessels that put pressure on the occipital nerves.  Infections.  Tumors.  Diabetes. What are the signs or symptoms? This condition causes brief burning, stabbing, electric, shocking, or shooting pain which can radiate to the top of the head. It can happen on one side or both sides of the head. It can also cause:  Pain behind the eye.  Pain triggered by neck movement or hair brushing.  Scalp tenderness.  Aching in the back of the head between episodes of very bad pain.  Pain gets worse with exposure to bright lights. How is this diagnosed? There is no test that diagnoses this condition. Your health care provider may diagnose this condition based on a physical exam and your symptoms. Other tests may be done, such as:  Imaging studies of the brain and neck (cervical spine), such as an MRI or CT scan. These look for causes of pinched nerves.   Applying pressure to the nerves in the neck to try to re-create the pain.  Injection of numbing medicine into the occipital nerve areas to see if pain goes away (diagnostic nerve block). How is this treated? Treatment for this condition may begin with simple measures, such as:  Rest.  Massage.  Applying heat or cold on the area.  Over-the-counter pain relievers. If these measures do not work, you may need other treatments, including:  Medicines, such as: ? Prescription-strength anti-inflammatory medicines. ? Muscle relaxants. ? Anti-seizure medicines, which can relieve pain. ? Antidepressants, which can relieve pain. ? Injected medicines, such as medicines that numb the area (local anesthetic) and steroids.  Pulsed radiofrequency ablation. This is when wires are implanted to deliver electrical impulses that block pain signals from the occipital nerve.  Surgery to relieve nerve pressure.  Physical therapy. Follow these instructions at home: Pain management      Avoid any activities that cause pain.  Rest when you have an attack of pain.  Try gentle massage to relieve pain.  Try a different pillow or sleeping position.  If directed, apply heat to the affected area as told by your health care provider. Use the heat source that your health care provider recommends, such as a moist heat pack or a heating pad. ? Place a towel between your skin and the heat source. ? Leave the heat on for 20-30 minutes. ? Remove the heat if your skin turns bright red. This is especially important if you are  unable to feel pain, heat, or cold. You may have a greater risk of getting burned.  If directed, apply ice to the back of the head and neck area as told by your health care provider. ? Put ice in a plastic bag. ? Place a towel between your skin and the bag. ? Leave the ice on for 20 minutes, 2-3 times per day. General instructions  Take over-the-counter and prescription medicines only  as told by your health care provider.  Avoid things that make your symptoms worse, such as bright lights.  Try to stay active. Get regular exercise that does not cause pain. Ask your health care provider to suggest safe exercises for you.  Work with a physical therapist to learn stretching exercises you can do at home.  Practice good posture.  Keep all follow-up visits as told by your health care provider. This is important. Contact a health care provider if:  Your medicine is not working.  You have new or worsening symptoms. Get help right away if:  You have very bad head pain that does not go away.  You have a sudden change in vision, balance, or speech. Summary  Occipital neuralgia is a type of headache that causes brief episodes of very bad pain in the back of your head.  Pain from occipital neuralgia may spread (radiate) to other parts of your head.  Treatment for this condition includes rest, massage, and medicines. This information is not intended to replace advice given to you by your health care provider. Make sure you discuss any questions you have with your health care provider. Document Released: 07/19/2001 Document Revised: 07/11/2017 Document Reviewed: 09/29/2016 Elsevier Patient Education  2020 [employer].

## 2019-04-24 ENCOUNTER — Telehealth: Payer: Self-pay | Admitting: Family Medicine

## 2019-04-24 NOTE — Telephone Encounter (Signed)
I called pt and she relayed that she forgot to relay that she has L jaw pain from a fall that she told you about.  Just FYI.

## 2019-04-24 NOTE — Telephone Encounter (Addendum)
Pt has called to inform that due to the changes that NP Amy made to her gabapentin (NEURONTIN) 300 MG capsule a new script will need to be sent to [id number] so that she does not run out.  Pt also wants RN to know that her left side jaw is very painful.  Please call to discuss

## 2019-04-24 NOTE — Addendum Note (Signed)
Addended by: Brandon Melnick on: 04/24/2019 02:46 PM   Modules accepted: Orders

## 2019-04-25 DIAGNOSIS — M7502 Adhesive capsulitis of left shoulder: Secondary | ICD-10-CM | POA: Diagnosis not present

## 2019-04-25 DIAGNOSIS — M6281 Muscle weakness (generalized): Secondary | ICD-10-CM | POA: Diagnosis not present

## 2019-04-25 DIAGNOSIS — M25512 Pain in left shoulder: Secondary | ICD-10-CM | POA: Diagnosis not present

## 2019-04-25 DIAGNOSIS — Z4789 Encounter for other orthopedic aftercare: Secondary | ICD-10-CM | POA: Diagnosis not present

## 2019-04-29 ENCOUNTER — Telehealth: Payer: Self-pay | Admitting: Internal Medicine

## 2019-04-29 ENCOUNTER — Other Ambulatory Visit (HOSPITAL_COMMUNITY): Admission: RE | Admit: 2019-04-29 | Payer: Medicare Other | Source: Ambulatory Visit

## 2019-04-29 ENCOUNTER — Telehealth: Payer: Self-pay | Admitting: Pulmonary Disease

## 2019-04-29 DIAGNOSIS — M79601 Pain in right arm: Secondary | ICD-10-CM | POA: Diagnosis not present

## 2019-04-29 DIAGNOSIS — M25551 Pain in right hip: Secondary | ICD-10-CM | POA: Diagnosis not present

## 2019-04-29 DIAGNOSIS — M6281 Muscle weakness (generalized): Secondary | ICD-10-CM | POA: Diagnosis not present

## 2019-04-29 DIAGNOSIS — M545 Low back pain: Secondary | ICD-10-CM | POA: Diagnosis not present

## 2019-04-29 DIAGNOSIS — M62830 Muscle spasm of back: Secondary | ICD-10-CM | POA: Diagnosis not present

## 2019-04-29 NOTE — Telephone Encounter (Signed)
Bett iJo can we reschedule pt's Covid test?

## 2019-04-29 NOTE — Telephone Encounter (Signed)
Error

## 2019-04-29 NOTE — Telephone Encounter (Signed)
Cassidy Olsen

## 2019-04-29 NOTE — Telephone Encounter (Signed)
Rescheduled patient's covid testing. Nothing further needed at this time.

## 2019-04-30 ENCOUNTER — Other Ambulatory Visit (HOSPITAL_COMMUNITY)
Admission: RE | Admit: 2019-04-30 | Discharge: 2019-04-30 | Disposition: A | Discharge status: Home / Self Care | Payer: Medicare Other | Source: Ambulatory Visit | Attending: Pulmonary Disease | Admitting: Pulmonary Disease

## 2019-04-30 DIAGNOSIS — Z01812 Encounter for preprocedural laboratory examination: Secondary | ICD-10-CM | POA: Diagnosis not present

## 2019-04-30 DIAGNOSIS — Z20828 Contact with and (suspected) exposure to other viral communicable diseases: Secondary | ICD-10-CM | POA: Diagnosis not present

## 2019-04-30 DIAGNOSIS — F4325 Adjustment disorder with mixed disturbance of emotions and conduct: Secondary | ICD-10-CM | POA: Diagnosis not present

## 2019-04-30 LAB — SARS CORONAVIRUS 2 (TAT 6-24 HRS): SARS Coronavirus 2: NEGATIVE

## 2019-04-30 NOTE — Telephone Encounter (Signed)
Pt called back stating that she has enough medication to last her till tomorrow and she is wanting to know when the refill will be sent out. Pt states that a new prescription with the dosage change is needing to [facility] [address] stating that she takes 2 three times a day.  Please advise.

## 2019-05-01 ENCOUNTER — Other Ambulatory Visit: Payer: Self-pay | Admitting: Internal Medicine

## 2019-05-01 DIAGNOSIS — M7502 Adhesive capsulitis of left shoulder: Secondary | ICD-10-CM | POA: Diagnosis not present

## 2019-05-01 DIAGNOSIS — J4520 Mild intermittent asthma, uncomplicated: Secondary | ICD-10-CM

## 2019-05-01 DIAGNOSIS — M6281 Muscle weakness (generalized): Secondary | ICD-10-CM | POA: Diagnosis not present

## 2019-05-01 DIAGNOSIS — M25512 Pain in left shoulder: Secondary | ICD-10-CM | POA: Diagnosis not present

## 2019-05-01 MED ORDER — GABAPENTIN 300 MG PO CAPS: 300.0000 mg | ORAL_CAPSULE | Freq: Three times a day (TID) | ORAL | 11 refills | Status: DC

## 2019-05-01 NOTE — Addendum Note (Signed)
Addended by: Debbora Presto L on: 05/01/2019 07:22 AM   Modules accepted: Orders

## 2019-05-02 ENCOUNTER — Ambulatory Visit: Payer: Medicare Other | Admitting: Adult Health

## 2019-05-02 ENCOUNTER — Encounter: Payer: Self-pay | Admitting: *Deleted

## 2019-05-02 ENCOUNTER — Telehealth: Payer: Self-pay | Admitting: Pulmonary Disease

## 2019-05-02 DIAGNOSIS — M6281 Muscle weakness (generalized): Secondary | ICD-10-CM | POA: Diagnosis not present

## 2019-05-02 DIAGNOSIS — M7502 Adhesive capsulitis of left shoulder: Secondary | ICD-10-CM | POA: Diagnosis not present

## 2019-05-02 DIAGNOSIS — M533 Sacrococcygeal disorders, not elsewhere classified: Secondary | ICD-10-CM | POA: Diagnosis not present

## 2019-05-02 DIAGNOSIS — M25512 Pain in left shoulder: Secondary | ICD-10-CM | POA: Diagnosis not present

## 2019-05-02 NOTE — Telephone Encounter (Signed)
Noted. Will place up front in cabinet for pick up

## 2019-05-02 NOTE — Telephone Encounter (Signed)
Pt returned call.Will pick letter up tomorrow when she comes in for pft visit

## 2019-05-02 NOTE — Telephone Encounter (Signed)
Spoke with the pt  She states she needs a letter stating that due to her medical condition she can not go to a public gym  This is NOT bc she can not wear a mask, but bc she is high risk and does not want to be out in a public setting  She wants her money back from her membership    Per [place] to write letter stating pt has asthma and is high risk from covid 19 and should not go to gym due to this being in such a public setting    Letter done  [facility] and see if she wants to pick up or have this mailed to her  I have it signed at my desk

## 2019-05-03 ENCOUNTER — Other Ambulatory Visit: Payer: Self-pay

## 2019-05-03 ENCOUNTER — Ambulatory Visit (INDEPENDENT_AMBULATORY_CARE_PROVIDER_SITE_OTHER): Payer: Medicare Other | Admitting: Internal Medicine

## 2019-05-03 DIAGNOSIS — M79601 Pain in right arm: Secondary | ICD-10-CM | POA: Diagnosis not present

## 2019-05-03 DIAGNOSIS — M6281 Muscle weakness (generalized): Secondary | ICD-10-CM | POA: Diagnosis not present

## 2019-05-03 DIAGNOSIS — M545 Low back pain: Secondary | ICD-10-CM | POA: Diagnosis not present

## 2019-05-03 DIAGNOSIS — Z23 Encounter for immunization: Secondary | ICD-10-CM | POA: Diagnosis not present

## 2019-05-03 DIAGNOSIS — M25551 Pain in right hip: Secondary | ICD-10-CM | POA: Diagnosis not present

## 2019-05-03 DIAGNOSIS — J4520 Mild intermittent asthma, uncomplicated: Secondary | ICD-10-CM

## 2019-05-03 DIAGNOSIS — M62830 Muscle spasm of back: Secondary | ICD-10-CM | POA: Diagnosis not present

## 2019-05-03 LAB — PULMONARY FUNCTION TEST
DL/VA % pred: 103 %
DL/VA: 4.42 ml/min/mmHg/L
DLCO unc % pred: 109 %
DLCO unc: 21.67 ml/min/mmHg
FEF 25-75 Post: 2.31 L/sec
FEF 25-75 Pre: 1.85 L/sec
FEF2575-%Change-Post: 24 %
FEF2575-%Pred-Post: 109 %
FEF2575-%Pred-Pre: 87 %
FEV1-%Change-Post: 4 %
FEV1-%Pred-Post: 120 %
FEV1-%Pred-Pre: 114 %
FEV1-Post: 2.49 L
FEV1-Pre: 2.38 L
FEV1FVC-%Change-Post: 4 %
FEV1FVC-%Pred-Pre: 94 %
FEV6-%Change-Post: 0 %
FEV6-%Pred-Post: 123 %
FEV6-%Pred-Pre: 123 %
FEV6-Post: 3.14 L
FEV6-Pre: 3.14 L
FEV6FVC-%Pred-Post: 103 %
FEV6FVC-%Pred-Pre: 103 %
FVC-%Change-Post: 0 %
FVC-%Pred-Post: 119 %
FVC-%Pred-Pre: 119 %
FVC-Post: 3.14 L
FVC-Pre: 3.14 L
Post FEV1/FVC ratio: 79 %
Post FEV6/FVC ratio: 100 %
Pre FEV1/FVC ratio: 76 %
Pre FEV6/FVC Ratio: 100 %
RV % pred: 159 %
RV: 3 L
TLC % pred: 123 %
TLC: 6.05 L

## 2019-05-03 NOTE — Progress Notes (Signed)
Full PFT performed today. °

## 2019-05-07 DIAGNOSIS — M7502 Adhesive capsulitis of left shoulder: Secondary | ICD-10-CM | POA: Diagnosis not present

## 2019-05-07 DIAGNOSIS — K5903 Drug induced constipation: Secondary | ICD-10-CM | POA: Diagnosis not present

## 2019-05-07 DIAGNOSIS — M25512 Pain in left shoulder: Secondary | ICD-10-CM | POA: Diagnosis not present

## 2019-05-07 DIAGNOSIS — K824 Cholesterolosis of gallbladder: Secondary | ICD-10-CM | POA: Diagnosis not present

## 2019-05-07 DIAGNOSIS — M6281 Muscle weakness (generalized): Secondary | ICD-10-CM | POA: Diagnosis not present

## 2019-05-07 DIAGNOSIS — K8080 Other cholelithiasis without obstruction: Secondary | ICD-10-CM | POA: Diagnosis not present

## 2019-05-07 DIAGNOSIS — K219 Gastro-esophageal reflux disease without esophagitis: Secondary | ICD-10-CM | POA: Diagnosis not present

## 2019-05-07 DIAGNOSIS — R140 Abdominal distension (gaseous): Secondary | ICD-10-CM | POA: Diagnosis not present

## 2019-05-08 DIAGNOSIS — M6281 Muscle weakness (generalized): Secondary | ICD-10-CM | POA: Diagnosis not present

## 2019-05-08 DIAGNOSIS — M79601 Pain in right arm: Secondary | ICD-10-CM | POA: Diagnosis not present

## 2019-05-08 DIAGNOSIS — M25551 Pain in right hip: Secondary | ICD-10-CM | POA: Diagnosis not present

## 2019-05-08 DIAGNOSIS — M62830 Muscle spasm of back: Secondary | ICD-10-CM | POA: Diagnosis not present

## 2019-05-08 DIAGNOSIS — M545 Low back pain: Secondary | ICD-10-CM | POA: Diagnosis not present

## 2019-05-09 DIAGNOSIS — M25512 Pain in left shoulder: Secondary | ICD-10-CM | POA: Diagnosis not present

## 2019-05-09 DIAGNOSIS — G894 Chronic pain syndrome: Secondary | ICD-10-CM | POA: Diagnosis not present

## 2019-05-09 DIAGNOSIS — M6281 Muscle weakness (generalized): Secondary | ICD-10-CM | POA: Diagnosis not present

## 2019-05-09 DIAGNOSIS — M5136 Other intervertebral disc degeneration, lumbar region: Secondary | ICD-10-CM | POA: Diagnosis not present

## 2019-05-09 DIAGNOSIS — M75101 Unspecified rotator cuff tear or rupture of right shoulder, not specified as traumatic: Secondary | ICD-10-CM | POA: Diagnosis not present

## 2019-05-09 DIAGNOSIS — M7502 Adhesive capsulitis of left shoulder: Secondary | ICD-10-CM | POA: Diagnosis not present

## 2019-05-09 DIAGNOSIS — M47816 Spondylosis without myelopathy or radiculopathy, lumbar region: Secondary | ICD-10-CM | POA: Diagnosis not present

## 2019-05-09 DIAGNOSIS — Z79891 Long term (current) use of opiate analgesic: Secondary | ICD-10-CM | POA: Diagnosis not present

## 2019-05-09 DIAGNOSIS — Z79899 Other long term (current) drug therapy: Secondary | ICD-10-CM | POA: Diagnosis not present

## 2019-05-10 DIAGNOSIS — M6281 Muscle weakness (generalized): Secondary | ICD-10-CM | POA: Diagnosis not present

## 2019-05-10 DIAGNOSIS — M545 Low back pain: Secondary | ICD-10-CM | POA: Diagnosis not present

## 2019-05-10 DIAGNOSIS — M25551 Pain in right hip: Secondary | ICD-10-CM | POA: Diagnosis not present

## 2019-05-10 DIAGNOSIS — M79601 Pain in right arm: Secondary | ICD-10-CM | POA: Diagnosis not present

## 2019-05-10 DIAGNOSIS — M62830 Muscle spasm of back: Secondary | ICD-10-CM | POA: Diagnosis not present

## 2019-05-13 ENCOUNTER — Encounter: Payer: Self-pay | Admitting: Pulmonary Disease

## 2019-05-13 ENCOUNTER — Other Ambulatory Visit: Payer: Self-pay

## 2019-05-13 ENCOUNTER — Ambulatory Visit (INDEPENDENT_AMBULATORY_CARE_PROVIDER_SITE_OTHER): Payer: Medicare Other | Admitting: Pulmonary Disease

## 2019-05-13 VITALS — BP 118/78 | HR 103 | Temp 97.2°F | Ht 63.0 in | Wt 161.8 lb

## 2019-05-13 DIAGNOSIS — J4520 Mild intermittent asthma, uncomplicated: Secondary | ICD-10-CM

## 2019-05-13 DIAGNOSIS — R05 Cough: Secondary | ICD-10-CM

## 2019-05-13 DIAGNOSIS — R058 Other specified cough: Secondary | ICD-10-CM

## 2019-05-13 DIAGNOSIS — J302 Other seasonal allergic rhinitis: Secondary | ICD-10-CM | POA: Diagnosis not present

## 2019-05-13 DIAGNOSIS — R059 Cough, unspecified: Secondary | ICD-10-CM

## 2019-05-13 MED ORDER — ALBUTEROL SULFATE HFA 108 (90 BASE) MCG/ACT IN AERS: 2.0000 | INHALATION_SPRAY | Freq: Four times a day (QID) | RESPIRATORY_TRACT | 11 refills | Status: DC | PRN

## 2019-05-13 MED ORDER — MONTELUKAST SODIUM 10 MG PO TABS: 10.0000 mg | ORAL_TABLET | Freq: Every day | ORAL | 11 refills | Status: DC

## 2019-05-13 MED ORDER — BUDESONIDE-FORMOTEROL FUMARATE 80-4.5 MCG/ACT IN AERO: 2.0000 | INHALATION_SPRAY | Freq: Two times a day (BID) | RESPIRATORY_TRACT | 5 refills | Status: DC

## 2019-05-13 MED ORDER — FLUTICASONE PROPIONATE 50 MCG/ACT NA SUSP: 2.0000 | Freq: Every day | NASAL | 11 refills | Status: DC

## 2019-05-13 NOTE — Patient Instructions (Addendum)
Thank you for visiting Dr. Valeta Harms at [facility]. Today we recommend the following:  Meds ordered this encounter  Medications  . budesonide-formoterol (SYMBICORT) 80-4.5 MCG/ACT inhaler    Sig: Inhale 2 puffs into the lungs every 12 (twelve) hours.    Dispense:  1 Inhaler    Refill:  5  . albuterol (VENTOLIN HFA) 108 (90 Base) MCG/ACT inhaler    Sig: Inhale 2 puffs into the lungs every 6 (six) hours as needed for wheezing or shortness of breath.    Dispense:  18 g    Refill:  11  . montelukast (SINGULAIR) 10 MG tablet    Sig: Take 1 tablet (10 mg total) by mouth at bedtime.    Dispense:  30 tablet    Refill:  11  . fluticasone (FLONASE) 50 MCG/ACT nasal spray    Sig: Place 2 sprays into both nostrils daily.    Dispense:  16 g    Refill:  11   Return in about 1 year (around 05/12/2020).    Please do your part to reduce the spread of COVID-19.

## 2019-05-13 NOTE — Progress Notes (Signed)
Synopsis: Referred in September 2019 for asthma by No ref. provider found  Subjective:   PATIENT ID: Cassidy Olsen GENDER: female DOB: [birth year], MRN: [id number]  Chief Complaint  Patient presents with  . Follow-up    F/U for PFT. She reports her breathing has been at her baseline. Using symbicort and albuterol prn.    Seen initially for SOB. First week of august she was sick, cough, fevers, chills. She was treated with azithromycin. She has pain her left chest that she describes as pleuritic. The pain lasted for about 3 weeks. 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 The MRI revealed an abnormality in the chest.   She started to feel some better. She still has a dry cough. Her SOB is better and no issue with climbing stairs. Of note, she has had several bouts of bronchitis. Routinely, usually 1-2 times per year with bronchitis. She was told that she may have chronic bronchitis. Patient does have seasonal allergies.  She does use Flonase for allergic rhinitis.  This is usually worse in the fall and early spring time.  She denies aspirin and sensitivity.  She does have a history of asthma.  She had episodic wheezing diagnosed in her mid 4s.  She stated she was much heavier at this time.  And once losing significant amount awake her respiratory symptoms improved.  She does not have any pets, birds, dogs or cats in the home.  She is retired and works Retail buyer.She was started on Symbicort as well as as needed albuterol by her primary care provider which states has improved her symptoms.  She has not been terribly compliant with the use of Symbicort.  OV 05/30/2018: She feels like she is breathing better. She has been using symbicort regularly as well as the singulair. She does feel like this is her worst time of year. She does feel as if the medications are maybe helping. Since our last visit she had a PET scan completed for evaluation of the lung nodule found on  original CT imaging.  The PET scan showed near complete resolution of this area of the lung.  She does have a small area of groundglass remaining.  As for her bronchial symptoms she still has occasional chest tightness.  But overall has significantly improved on Symbicort.  Patient denies chest pain, fevers, sputum production today.  OV 05/13/2019: Patient was seen in June 2020 by ENT for chronic cough.  Patient was given behavioral modifications to help stop the constant throat clearing sensations.  Overall she has been doing well since her last office visit.  I have not seen her in nearly a year now.  With her current medication regimen of Singulair, Symbicort, albuterol, Flonase, antihistamine her allergy symptoms and breathing symptoms have significantly improved.  She did have a bout back in the springtime which she saw Dr. Melvyn Novas.  Overall, she needs refills of her medications today.  She does feel like she is not always compliant with her Symbicort and has been using it more as needed recently.  She did state that the fall was her most common season for significant allergy symptoms.  I encouraged her to restart her Symbicort during this time to help improve some of her symptoms.  We reviewed her pulmonary function test that was completed last week.  A FEV1 of 2.5 L, 120% predicted no significant bronchodilator response, RV 159%, RV/TLC ratio of 130, DLCO 109.   Past Medical History:  Diagnosis Date  . Asthma   . Chronic pain   . Family history of breast cancer   . Family history of prostate cancer   . GERD (gastroesophageal reflux disease)   . High cholesterol   . IBS (irritable bowel syndrome)   . Migraine   . Occipital neuralgia   . Osteoarthritis   . Spinal stenosis   . Trigeminal neuralgia   . Trigeminal neuralgia      Family History  Problem Relation Age of Onset  . Ulcers Mother 30       peptic ulcer  . Prostate cancer Father 65  . Breast cancer Sister 57       ER+/PR+/Her2-  breast cancer  . Breast cancer Paternal Grandmother   . Diabetes Paternal Grandmother   . Cancer Paternal Uncle        unknown cancers     Social History   Socioeconomic History  . Marital status: Single    Spouse name: Not on file  . Number of children: 1  . Years of education: Not on file  . Highest education level: Not on file  Occupational History  . Not on file  Social Needs  . Financial resource strain: Not on file  . Food insecurity    Worry: Not on file    Inability: Not on file  . Transportation needs    Medical: Not on file    Non-medical: Not on file  Tobacco Use  . Smoking status: Never Smoker  . Smokeless tobacco: Never Used  Substance and Sexual Activity  . Alcohol use: Yes    Comment: socially  . Drug use: No  . Sexual activity: Not on file  Lifestyle  . Physical activity    Days per week: Not on file    Minutes per session: Not on file  . Stress: Not on file  Relationships  . Social Herbalist on phone: Not on file    Gets together: Not on file    Attends religious service: Not on file    Active member of club or organization: Not on file    Attends meetings of clubs or organizations: Not on file    Relationship status: Not on file  . Intimate partner violence    Fear of current or ex partner: Not on file    Emotionally abused: Not on file    Physically abused: Not on file    Forced sexual activity: Not on file  Other Topics Concern  . Not on file  Social History Narrative  . Not on file     Allergies  Allergen Reactions  . Aspirin   . Codeine     Cough meds with codeine-have withdraw symptoms when done  . Ibuprofen   . Latex   . Penicillins      Outpatient Medications Prior to Visit  Medication Sig Dispense Refill  . acetaminophen (TYLENOL) 325 MG tablet Take 650 mg by mouth daily.    Marland Kitchen amitriptyline (ELAVIL) 50 MG tablet Take 50 mg by mouth at bedtime.    . baclofen (LIORESAL) 10 MG tablet Take 10 mg by mouth daily.      . celecoxib (CELEBREX) 200 MG capsule Take 200 mg by mouth daily.     . clotrimazole (MYCELEX) 10 MG troche Take 1 tablet (10 mg total) by mouth 4 (four) times daily. 16 tablet 0  . dexlansoprazole (DEXILANT) 60 MG capsule Take 60 mg by mouth daily.    Marland Kitchen  fenofibrate (TRICOR) 145 MG tablet Take by mouth.    . gabapentin (NEURONTIN) 300 MG capsule Take 1-2 capsules (300-600 mg total) by mouth 3 (three) times daily. 180 capsule 11  . HYDROcodone-acetaminophen (NORCO/VICODIN) 5-325 MG tablet     . lubiprostone (AMITIZA) 24 MCG capsule Take 24 mcg by mouth 2 (two) times daily with a meal.    . Melatonin 10 MG TABS Take by mouth.    . methocarbamol (ROBAXIN) 500 MG tablet TAKE 1 TABLET BY MOUTH EVERY 6 TO 8 HOURS AS NEEDED FOR SPASMS FOR 10 DAYS    . nystatin (MYCOSTATIN) 100000 UNIT/ML suspension Take 5 mLs (500,000 Units total) by mouth 3 (three) times daily. 140 mL 0  . oxyCODONE-acetaminophen (PERCOCET/ROXICET) 5-325 MG tablet Take 1 tablet by mouth every 6 (six) hours.    . Simethicone 180 MG CAPS Take 1 capsule (180 mg total) by mouth 2 (two) times daily. (Patient taking differently: Take 180 mg by mouth as needed. ) 30 capsule 0  . Spacer/Aero-Holding Chambers (AEROCHAMBER MV) inhaler Use as instructed 1 each 0  . tapentadol (NUCYNTA ER) 100 MG 12 hr tablet Take 100 mg by mouth every 12 (twelve) hours.    . topiramate (TOPAMAX) 100 MG tablet Take 100 mg by mouth 2 (two) times daily.    . traMADol (ULTRAM) 50 MG tablet Take 1 tablet (50 mg total) by mouth every 4 (four) hours as needed (cough / sore throat). 40 tablet 0  . valACYclovir (VALTREX) 500 MG tablet Take 500 mg by mouth as needed.     . budesonide-formoterol (SYMBICORT) 80-4.5 MCG/ACT inhaler Inhale 2 puffs into the lungs every 12 (twelve) hours. 1 Inhaler 5  . montelukast (SINGULAIR) 10 MG tablet Take 1 tablet (10 mg total) by mouth at bedtime. 30 tablet 11   No facility-administered medications prior to visit.     Review of  Systems  Constitutional: Negative for chills, fever, malaise/fatigue and weight loss.  HENT: Negative for hearing loss, sore throat and tinnitus.   Eyes: Negative for blurred vision and double vision.  Respiratory: Negative for cough, hemoptysis, sputum production, shortness of breath, wheezing and stridor.   Cardiovascular: Negative for chest pain, palpitations, orthopnea, leg swelling and PND.  Gastrointestinal: Negative for abdominal pain, constipation, diarrhea, heartburn, nausea and vomiting.  Genitourinary: Negative for dysuria, hematuria and urgency.  Musculoskeletal: Negative for joint pain and myalgias.  Skin: Negative for itching and rash.  Neurological: Negative for dizziness, tingling, weakness and headaches.  Endo/Heme/Allergies: Negative for environmental allergies. Does not bruise/bleed easily.  Psychiatric/Behavioral: Negative for depression. The patient is not nervous/anxious and does not have insomnia.   All other systems reviewed and are negative.    Objective:  Physical Exam Vitals signs reviewed.  Constitutional:      General: She is not in acute distress.    Appearance: She is well-developed.  HENT:     Head: Normocephalic and atraumatic.  Eyes:     General: No scleral icterus.    Conjunctiva/sclera: Conjunctivae normal.     Pupils: Pupils are equal, round, and reactive to light.  Neck:     Musculoskeletal: Neck supple.     Vascular: No JVD.     Trachea: No tracheal deviation.  Cardiovascular:     Rate and Rhythm: Normal rate and regular rhythm.     Heart sounds: Normal heart sounds. No murmur.  Pulmonary:     Effort: Pulmonary effort is normal. No tachypnea, accessory muscle usage or respiratory distress.  Breath sounds: Normal breath sounds. No stridor. No wheezing, rhonchi or rales.  Abdominal:     General: Bowel sounds are normal. There is no distension.     Palpations: Abdomen is soft.     Tenderness: There is no abdominal tenderness.   Musculoskeletal:        General: No tenderness.  Lymphadenopathy:     Cervical: No cervical adenopathy.  Skin:    General: Skin is warm and dry.     Capillary Refill: Capillary refill takes less than 2 seconds.     Findings: No rash.  Neurological:     Mental Status: She is alert and oriented to person, place, and time.  Psychiatric:        Behavior: Behavior normal.      Vitals:   05/13/19 1147  BP: 118/78  Pulse: (!) 103  Temp: (!) 97.2 F (36.2 C)  TempSrc: Temporal  SpO2: 100%  Weight: 161 lb 12.8 oz (73.4 kg)  Height: _0  (1.6 m)   100% on RA BMI Readings from Last 3 Encounters:  05/13/19 28.66 kg/m  04/23/19 28.41 kg/m  04/19/19 28.17 kg/m   Wt Readings from Last 3 Encounters:  05/13/19 161 lb 12.8 oz (73.4 kg)  04/23/19 160 lb 6.4 oz (72.8 kg)  04/19/19 159 lb (72.1 kg)   CBC    Component Value Date/Time   WBC 8.1 04/18/2018 1412   RBC 3.96 04/18/2018 1412   HGB 12.3 04/18/2018 1412   HCT 36.5 04/18/2018 1412   PLT 429 (H) 04/18/2018 1412   MCV 92.2 04/18/2018 1412   MCH 31.1 04/18/2018 1412   MCHC 33.7 04/18/2018 1412   RDW 13.2 04/18/2018 1412   LYMPHSABS 3,653 04/18/2018 1412   MONOABS 0.6 11/18/2016 2303   EOSABS 211 04/18/2018 1412   BASOSABS 73 04/18/2018 1412   Regional Allergy Panel 04/18/2018 - Negative  IgE: 47  Chest Imaging:  CXR 04/17/2018: Left sided nodular density   Nuclear medicine PET scan: 05/21/2018: SUV max 2.2 left lower lobe bandlike opacity seen on prior CT imaging.  Much improved the last nodular masslike appearance. 4 mm right ureteropelvic junction calculus, cystic left adnexal mass no metabolic activity.  Pulmonary Functions Testing Results: PFT Results Latest Ref Rng & Units 05/03/2019  FVC-Pre L 3.14  FVC-Predicted Pre % 119  FVC-Post L 3.14  FVC-Predicted Post % 119  Pre FEV1/FVC % % 76  Post FEV1/FCV % % 79  FEV1-Pre L 2.38  FEV1-Predicted Pre % 114  FEV1-Post L 2.49  DLCO UNC% % 109  DLCO COR  %Predicted % 103  TLC L 6.05  TLC % Predicted % 123  RV % Predicted % 159    FeNO: None  Pathology: None  Echocardiogram: None  Heart Catheterization: None    Assessment & Plan:    Mild intermittent asthma, unspecified whether complicated  Upper airway cough syndrome  Cough  Seasonal allergies  Discussion:  58 year old female history of allergic rhinitis, prior history of asthma diagnosis since her 42s.  Recent Rast panel negative IgE 47.  History of community-acquired pneumonia.  We were recently following for a left lower lobe lung nodule that was resolved on subsequent PET scan imaging consistent with inflammatory lesion.  As for her allergy symptoms and asthma symptoms we will continue the following: Continue Symbicort 2 puffs twice daily with spacer Continue albuterol for shortness of breath and wheezing Continue Singulair Continue Flonase  Patient to follow-up in our clinic with any recent changes.  Otherwise  we will see her again in 1 year.  Greater than 50% of this patient's 50-minute of visit was spent face-to-face discussing above recommendations and treatment plan as well as refill of her medications.    Current Outpatient Medications:  .  acetaminophen (TYLENOL) 325 MG tablet, Take 650 mg by mouth daily., Disp: , Rfl:  .  amitriptyline (ELAVIL) 50 MG tablet, Take 50 mg by mouth at bedtime., Disp: , Rfl:  .  baclofen (LIORESAL) 10 MG tablet, Take 10 mg by mouth daily. , Disp: , Rfl:  .  budesonide-formoterol (SYMBICORT) 80-4.5 MCG/ACT inhaler, Inhale 2 puffs into the lungs every 12 (twelve) hours., Disp: 1 Inhaler, Rfl: 5 .  celecoxib (CELEBREX) 200 MG capsule, Take 200 mg by mouth daily. , Disp: , Rfl:  .  clotrimazole (MYCELEX) 10 MG troche, Take 1 tablet (10 mg total) by mouth 4 (four) times daily., Disp: 16 tablet, Rfl: 0 .  dexlansoprazole (DEXILANT) 60 MG capsule, Take 60 mg by mouth daily., Disp: , Rfl:  .  fenofibrate (TRICOR) 145 MG tablet, Take by  mouth., Disp: , Rfl:  .  gabapentin (NEURONTIN) 300 MG capsule, Take 1-2 capsules (300-600 mg total) by mouth 3 (three) times daily., Disp: 180 capsule, Rfl: 11 .  HYDROcodone-acetaminophen (NORCO/VICODIN) 5-325 MG tablet, , Disp: , Rfl:  .  lubiprostone (AMITIZA) 24 MCG capsule, Take 24 mcg by mouth 2 (two) times daily with a meal., Disp: , Rfl:  .  Melatonin 10 MG TABS, Take by mouth., Disp: , Rfl:  .  methocarbamol (ROBAXIN) 500 MG tablet, TAKE 1 TABLET BY MOUTH EVERY 6 TO 8 HOURS AS NEEDED FOR SPASMS FOR 10 DAYS, Disp: , Rfl:  .  montelukast (SINGULAIR) 10 MG tablet, Take 1 tablet (10 mg total) by mouth at bedtime., Disp: 30 tablet, Rfl: 11 .  nystatin (MYCOSTATIN) 100000 UNIT/ML suspension, Take 5 mLs (500,000 Units total) by mouth 3 (three) times daily., Disp: 140 mL, Rfl: 0 .  oxyCODONE-acetaminophen (PERCOCET/ROXICET) 5-325 MG tablet, Take 1 tablet by mouth every 6 (six) hours., Disp: , Rfl:  .  Simethicone 180 MG CAPS, Take 1 capsule (180 mg total) by mouth 2 (two) times daily. (Patient taking differently: Take 180 mg by mouth as needed. ), Disp: 30 capsule, Rfl: 0 .  Spacer/Aero-Holding Chambers (AEROCHAMBER MV) inhaler, Use as instructed, Disp: 1 each, Rfl: 0 .  tapentadol (NUCYNTA ER) 100 MG 12 hr tablet, Take 100 mg by mouth every 12 (twelve) hours., Disp: , Rfl:  .  topiramate (TOPAMAX) 100 MG tablet, Take 100 mg by mouth 2 (two) times daily., Disp: , Rfl:  .  traMADol (ULTRAM) 50 MG tablet, Take 1 tablet (50 mg total) by mouth every 4 (four) hours as needed (cough / sore throat)., Disp: 40 tablet, Rfl: 0 .  valACYclovir (VALTREX) 500 MG tablet, Take 500 mg by mouth as needed. , Disp: , Rfl:  .  albuterol (VENTOLIN HFA) 108 (90 Base) MCG/ACT inhaler, Inhale 2 puffs into the lungs every 6 (six) hours as needed for wheezing or shortness of breath., Disp: 18 g, Rfl: 11 .  fluticasone (FLONASE) 50 MCG/ACT nasal spray, Place 2 sprays into both nostrils daily., Disp: 16 g, Rfl: [place], DO [facility] 05/13/2019 12:10 PM

## 2019-05-14 ENCOUNTER — Other Ambulatory Visit: Payer: Self-pay

## 2019-05-14 ENCOUNTER — Encounter: Payer: Self-pay | Admitting: Family Medicine

## 2019-05-14 ENCOUNTER — Ambulatory Visit (INDEPENDENT_AMBULATORY_CARE_PROVIDER_SITE_OTHER): Payer: Medicare Other | Admitting: Family Medicine

## 2019-05-14 VITALS — BP 132/87 | HR 105 | Temp 98.0°F | Resp 17 | Ht 63.0 in | Wt 162.4 lb

## 2019-05-14 DIAGNOSIS — M545 Low back pain: Secondary | ICD-10-CM | POA: Diagnosis not present

## 2019-05-14 DIAGNOSIS — K5909 Other constipation: Secondary | ICD-10-CM | POA: Diagnosis not present

## 2019-05-14 DIAGNOSIS — M5136 Other intervertebral disc degeneration, lumbar region: Secondary | ICD-10-CM | POA: Diagnosis not present

## 2019-05-14 DIAGNOSIS — E7800 Pure hypercholesterolemia, unspecified: Secondary | ICD-10-CM | POA: Diagnosis not present

## 2019-05-14 DIAGNOSIS — J449 Chronic obstructive pulmonary disease, unspecified: Secondary | ICD-10-CM

## 2019-05-14 DIAGNOSIS — Z79891 Long term (current) use of opiate analgesic: Secondary | ICD-10-CM

## 2019-05-14 DIAGNOSIS — F4325 Adjustment disorder with mixed disturbance of emotions and conduct: Secondary | ICD-10-CM | POA: Diagnosis not present

## 2019-05-14 DIAGNOSIS — K8020 Calculus of gallbladder without cholecystitis without obstruction: Secondary | ICD-10-CM

## 2019-05-14 DIAGNOSIS — M79609 Pain in unspecified limb: Secondary | ICD-10-CM | POA: Diagnosis not present

## 2019-05-14 MED ORDER — PROAIR RESPICLICK 108 (90 BASE) MCG/ACT IN AEPB: 2.0000 | INHALATION_SPRAY | Freq: Four times a day (QID) | RESPIRATORY_TRACT | 6 refills | Status: DC | PRN

## 2019-05-14 MED ORDER — FENOFIBRATE 145 MG PO TABS: 145.0000 mg | ORAL_TABLET | Freq: Every day | ORAL | 2 refills | Status: DC

## 2019-05-14 NOTE — Patient Instructions (Signed)
° ° ° °  If you have lab work done today you will be contacted with your lab results within the next 2 weeks.  If you have not heard from us then please contact us. The fastest way to get your results is to register for My Chart. ° ° °IF you received an x-ray today, you will receive an invoice from [facility]. Please contact [facility] at [phone] with questions or concerns regarding your invoice.  ° °IF you received labwork today, you will receive an invoice from [facility]. Please contact [facility] at [phone] with questions or concerns regarding your invoice.  ° °Our billing staff will not be able to assist you with questions regarding bills from these companies. ° °You will be contacted with the lab results as soon as they are available. The fastest way to get your results is to activate your My Chart account. Instructions are located on the last page of this paperwork. If you have not heard from us regarding the results in 2 weeks, please contact this office. °  ° ° ° °

## 2019-05-14 NOTE — Progress Notes (Signed)
New Patient Office Visit  Subjective:  Patient ID: Cassidy Olsen, female    DOB: 12/08/1960  Age: 58 y.o. MRN: [id number]  CC:  Chief Complaint  Patient presents with  . New Patient (Initial Visit)    establish care  . Medication Refill    fenofibrate    HPI Cassidy Olsen presents for   Patient is a 36y is a new patient here to establish care She has DJD and numerous orthopedic conditions with Dr. Veverly Fells and Dr. Rolena Infante.  She is s/p numerous surgeries and deals with chronic pain.  She is currently a patient at [facility] where she gets her Nucynta 115m BID and sees Dr. SVira Blanc[facility].   She states that Dr. MCollene Maresher Gastroenterologist is taking her off amitiza and planning on using movantik She reports that she has been on montelukast for a week. She was taking bentyl but it was discontinued regarding her constipation.    She reports that Dr. MCollene Mareswas concerned about her kidney function from her most recent lab work. She states that she is feeling very low energy.  Past Medical History:  Diagnosis Date  . Asthma   . Chronic pain   . Family history of breast cancer   . Family history of prostate cancer   . GERD (gastroesophageal reflux disease)   . High cholesterol   . IBS (irritable bowel syndrome)   . Migraine   . Occipital neuralgia   . Osteoarthritis   . Spinal stenosis   . Trigeminal neuralgia   . Trigeminal neuralgia     Past Surgical History:  Procedure Laterality Date  . ABDOMINAL HYSTERECTOMY    . ANKLE SURGERY Left 05/2009  . APPENDECTOMY    . BACK SURGERY    . 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 . BRAIN SURGERY    . CARPAL TUNNEL RELEASE  2014  . JOINT REPLACEMENT     R knee  . OVARIAN CYST REMOVAL  05/2010  . right knee revision  2016  . ROTATOR CUFF REPAIR Right 02/2019  . SPINE SURGERY    . TUBAL LIGATION      Family History  Problem  Relation Age of Onset  . Ulcers Mother 528      peptic ulcer  . Prostate cancer Father 780 . Breast cancer Sister 652      ER+/PR+/Her2- breast cancer  . Breast cancer Paternal Grandmother   . Diabetes Paternal Grandmother   . Cancer Paternal Uncle        unknown cancers    Social History   Socioeconomic History  . Marital status: Single    Spouse name: Not on file  . Number of children: 1  . Years of education: Not on file  . Highest education level: Not on file  Occupational History  . Not on file  Social Needs  . Financial resource strain: Not on file  . Food insecurity    Worry: Not on file    Inability: Not on file  . Transportation needs    Medical: Not on file    Non-medical: Not on file  Tobacco Use  . Smoking status: Never Smoker  . Smokeless tobacco: Never Used  Substance and Sexual Activity  . Alcohol use: Yes    Comment: socially  . Drug use: No  . Sexual activity: Not on file  Lifestyle  . Physical activity  Days per week: Not on file    Minutes per session: Not on file  . Stress: Not on file  Relationships  . Social Herbalist on phone: Not on file    Gets together: Not on file    Attends religious service: Not on file    Active member of club or organization: Not on file    Attends meetings of clubs or organizations: Not on file    Relationship status: Not on file  . Intimate partner violence    Fear of current or ex partner: Not on file    Emotionally abused: Not on file    Physically abused: Not on file    Forced sexual activity: Not on file  Other Topics Concern  . Not on file  Social History Narrative   Lives at home alone   Right handed   Caffeine: 1 or 2 cups a week    ROS Review of Systems See hpi  Objective:   Today's Vitals: BP 132/87 (BP Location: Right Arm, Patient Position: Standing, Cuff Size: Normal)   Pulse (!) 105   Temp 98 F (36.7 C) (Oral)   Resp 17   Ht '5\' 3"'$  (1.6 m)   Wt 162 lb 6.4 oz (73.7 kg)    SpO2 98%   BMI 28.77 kg/m   Physical Exam Physical Exam  Constitutional: Oriented to person, place, and time. Appears well-developed and well-nourished.  HENT:  Head: Normocephalic and atraumatic.  Eyes: Conjunctivae and EOM are normal.  Cardiovascular: Normal rate, regular rhythm, normal heart sounds and intact distal pulses.  No murmur heard. Pulmonary/Chest: Effort normal and breath sounds normal. No stridor. No respiratory distress. Has no wheezes.  Neurological: Is alert and oriented to person, place, and time.  Skin: Skin is warm. Capillary refill takes less than 2 seconds.  Psychiatric: Has a normal mood and affect. Behavior is normal. Judgment and thought content normal.   Assessment & Plan:   Problem List Items Addressed This Visit      Respiratory   Chronic asthmatic bronchitis ([place])    Other Visit Diagnoses    Gallstones    -  Primary   Relevant Orders   Ambulatory referral to General Surgery   Chronic constipation       Chronic prescription opiate use       Degenerative disc disease, lumbar       Elevated cholesterol         Establish care visit Set up referral for consultation for gallstones  Continue current med for constipation  Continue with Pain [facility] for her chronic joint disease  Outpatient Encounter Medications as of 05/14/2019  Medication Sig  . acetaminophen (TYLENOL) 325 MG tablet Take 650 mg by mouth daily.  Marland Kitchen albuterol (VENTOLIN HFA) 108 (90 Base) MCG/ACT inhaler Inhale 2 puffs into the lungs every 6 (six) hours as needed for wheezing or shortness of breath.  . Albuterol Sulfate (PROAIR RESPICLICK) 633 (90 Base) MCG/ACT AEPB Inhale 2 puffs into the lungs every 6 (six) hours as needed.  . Ascorbic Acid (VITAMIN C) 1000 MG tablet Take 1,000 mg by mouth daily.  . budesonide-formoterol (SYMBICORT) 80-4.5 MCG/ACT inhaler Inhale 2 puffs into the lungs every 12 (twelve) hours.  . cyanocobalamin 1000 MCG tablet Take 1,000 mcg by mouth daily.  Marland Kitchen  dexlansoprazole (DEXILANT) 60 MG capsule Take 60 mg by mouth daily.  Marland Kitchen ELDERBERRY PO Take by mouth.  . fenofibrate (TRICOR) 145 MG tablet Take 1 tablet (145  mg total) by mouth daily.  . fluticasone (FLONASE) 50 MCG/ACT nasal spray Place 2 sprays into both nostrils daily.  Marland Kitchen gabapentin (NEURONTIN) 300 MG capsule Take 1-2 capsules (300-600 mg total) by mouth 3 (three) times daily.  Marland Kitchen loratadine (CLARITIN) 10 MG tablet Take 10 mg by mouth daily.  . Melatonin 10 MG TABS Take by mouth.  . methocarbamol (ROBAXIN) 500 MG tablet TAKE 1 TABLET BY MOUTH EVERY 6 TO 8 HOURS AS NEEDED FOR SPASMS FOR 10 DAYS  . montelukast (SINGULAIR) 10 MG tablet Take 1 tablet (10 mg total) by mouth at bedtime.  . OIL OF OREGANO PO Take by mouth.  . Simethicone 180 MG CAPS Take 1 capsule (180 mg total) by mouth 2 (two) times daily. (Patient taking differently: Take 180 mg by mouth as needed. )  . Spacer/Aero-Holding Chambers (AEROCHAMBER MV) inhaler Use as instructed  . tapentadol (NUCYNTA ER) 100 MG 12 hr tablet Take 100 mg by mouth every 12 (twelve) hours.  . valACYclovir (VALTREX) 500 MG tablet Take 500 mg by mouth as needed.   . zinc gluconate 50 MG tablet Take 50 mg by mouth daily.  . [DISCONTINUED] amitriptyline (ELAVIL) 50 MG tablet Take 50 mg by mouth at bedtime.  . [DISCONTINUED] baclofen (LIORESAL) 10 MG tablet Take 10 mg by mouth daily.   . [DISCONTINUED] calcium-vitamin D (OSCAL WITH D) 500-200 MG-UNIT tablet Take 1 tablet by mouth.  . [DISCONTINUED] calcium-vitamin D 250-100 MG-UNIT tablet Take 1 tablet by mouth 2 (two) times daily. Calcium 400 mg plus vitamin d  . [DISCONTINUED] clotrimazole (MYCELEX) 10 MG troche Take 1 tablet (10 mg total) by mouth 4 (four) times daily. (Patient not taking: Reported on 05/28/2019)  . [DISCONTINUED] fenofibrate (TRICOR) 145 MG tablet Take by mouth.  . [DISCONTINUED] lubiprostone (AMITIZA) 24 MCG capsule Take 24 mcg by mouth 2 (two) times daily with a meal.  . [DISCONTINUED]  topiramate (TOPAMAX) 100 MG tablet Take 100 mg by mouth 2 (two) times daily.  . celecoxib (CELEBREX) 200 MG capsule Take 200 mg by mouth daily.   . [DISCONTINUED] HYDROcodone-acetaminophen (NORCO/VICODIN) 5-325 MG tablet   . [DISCONTINUED] nystatin (MYCOSTATIN) 100000 UNIT/ML suspension Take 5 mLs (500,000 Units total) by mouth 3 (three) times daily.  . [DISCONTINUED] oxyCODONE-acetaminophen (PERCOCET/ROXICET) 5-325 MG tablet Take 1 tablet by mouth every 6 (six) hours.  . [DISCONTINUED] traMADol (ULTRAM) 50 MG tablet Take 1 tablet (50 mg total) by mouth every 4 (four) hours as needed (cough / sore throat). (Patient not taking: Reported on 05/14/2019)   No facility-administered encounter medications on file as of 05/14/2019.     Follow-up: Return in about 6 weeks (around 06/25/2019) for chronic medical problems.   Forrest Moron, MD

## 2019-05-15 ENCOUNTER — Other Ambulatory Visit: Payer: Self-pay | Admitting: Pain Medicine

## 2019-05-15 DIAGNOSIS — M545 Low back pain, unspecified: Secondary | ICD-10-CM

## 2019-05-15 DIAGNOSIS — M79604 Pain in right leg: Secondary | ICD-10-CM

## 2019-05-15 DIAGNOSIS — M62830 Muscle spasm of back: Secondary | ICD-10-CM | POA: Diagnosis not present

## 2019-05-15 DIAGNOSIS — M79601 Pain in right arm: Secondary | ICD-10-CM | POA: Diagnosis not present

## 2019-05-15 DIAGNOSIS — M25551 Pain in right hip: Secondary | ICD-10-CM | POA: Diagnosis not present

## 2019-05-15 DIAGNOSIS — M6281 Muscle weakness (generalized): Secondary | ICD-10-CM | POA: Diagnosis not present

## 2019-05-16 DIAGNOSIS — M25512 Pain in left shoulder: Secondary | ICD-10-CM | POA: Diagnosis not present

## 2019-05-16 DIAGNOSIS — M7502 Adhesive capsulitis of left shoulder: Secondary | ICD-10-CM | POA: Diagnosis not present

## 2019-05-16 DIAGNOSIS — M6281 Muscle weakness (generalized): Secondary | ICD-10-CM | POA: Diagnosis not present

## 2019-05-17 DIAGNOSIS — M6281 Muscle weakness (generalized): Secondary | ICD-10-CM | POA: Diagnosis not present

## 2019-05-17 DIAGNOSIS — M25551 Pain in right hip: Secondary | ICD-10-CM | POA: Diagnosis not present

## 2019-05-17 DIAGNOSIS — M545 Low back pain: Secondary | ICD-10-CM | POA: Diagnosis not present

## 2019-05-17 DIAGNOSIS — M62830 Muscle spasm of back: Secondary | ICD-10-CM | POA: Diagnosis not present

## 2019-05-17 DIAGNOSIS — M79601 Pain in right arm: Secondary | ICD-10-CM | POA: Diagnosis not present

## 2019-05-27 DIAGNOSIS — M7502 Adhesive capsulitis of left shoulder: Secondary | ICD-10-CM | POA: Diagnosis not present

## 2019-05-27 DIAGNOSIS — M6281 Muscle weakness (generalized): Secondary | ICD-10-CM | POA: Diagnosis not present

## 2019-05-27 DIAGNOSIS — M25512 Pain in left shoulder: Secondary | ICD-10-CM | POA: Diagnosis not present

## 2019-05-27 NOTE — Progress Notes (Signed)
[facility]    Provider:  Dr Jaynee Eagles Referring Provider: No ref. provider found Primary Care Physician:  Patient, No Pcp Per  CC:  Chronic Migraines  Interval history May 27, 2019: Patient was initially seen in 2019 and has had several visits with nurse practitioner for her migraines, trigeminal neuralgia and occipital neuralgia, she has been given nerve blocks.  She has been titrated on gabapentin.  She seen Dr. Nancy Nordmann for sleep evaluation.  Sleep study did not show any significant obstructive sleep apnea no significant snoring.  She slept reasonably well and achieved all stages of sleep.  She fell in September of this year 2020 and at that time she increased her gabapentin which helped, also a nerve block helped.  At the time she was on Vicodin for shoulder pain however she was having upcoming surgery which was likely already completed by now.  She says the gabapentin is helping, her occipital pain is not as bad, she responds to nerve blocks, she has spasms and dry needling helped. She has migraines, pulsating, spasms, pounding and thobbing, can be unilateral and start in the occipita area, light and sound sensitivity. Movement makes it worse. Significant neck pain and spasms. Daily headaches for > 2 years and >12 migraine days a month with nausea. Migraines last 24 hours. No aura. No medication overuse.  Tried: gabapentin, elavil(amitriptyline), celebrex, tylenol, topiramate, robaxin, flexeril, propranolol HPI:  Cassidy Olsen is a 58 y.o. female here as a referral from Dr. No ref. provider found for trigeminal neuralgia.  She has a past medical history spinal stenosis, osteoarthritis, irritable bowel syndrome, high cholesterol, chronic pain, occipital neuralgia, lumbar degenerative disc disease, lumbar spondylosis, failed back surgical syndrome, low back pain, she is managed for her pain by a pain management clinic.  She is on NabuMetone, oxycodone, lidocaine, gabapentin,  amitriptyline, baclofen, topiramate, celexa and Nucynta.  She has had trigeminal decompression in the past. She developed right-sided trigeminal neuralgia in 2005, prior to that the occipital neuralgia. She was diagnosed at [facility] and has seen multiple neurologists and neurosurgeons. They have had her on "everything" she had etensive testing. MRIs, CTs, MRAs and diagnosed with occipital neuralgia. She then developed Trigeminal neuralgia and say a NSY and found blood vessel compressing the trigeminal nerve and she had decompressive surgery, she had MRIs of the trigeminal nerve. She can feel keys. Her baseline level pain is a 4+, baseline is a 4/10 daily and she is at a 6 now since I am typing. She gets regular occipital blocks. She has also been seen at [facility] She also saw doctors at [facility]. She has had sphenopalatine blocks. She has taken multiple medications including Tegretol, topamax, lyrica, neurontin, she was on about 18 medications a day. She is currently on multiple medications now.   Currently she gets nagging pain continuously on the right, movement makes it worse, she has shooting pains, spasms radiating up the occipital head, she also shooting pain down the face, exacerbated by wind, shooting pain across the face and eye, the cheek. Worsening since March. Now having left-side symptoms. Light and sounds can trigger headaches. A dark room helps. She has significant light sensitivity, sound sensitivity, movement makes it worse, no nausea or vomiting.   Reviewed notes, labs and imaging from outside physicians, which showed:   Review of records she is had a nerve block of the greater occipital nerve.  She has a history of occipital neuralgia as well as right trigeminal neuralgia.  She has undergone decompressive  surgery in [place] for the trigeminal neuralgia.  The occipital neuralgia has responded well to greater occipital nerve injections in the past.  She does not have a local  neurologist.  Pain is a 7 out of 10.  Will try to request imaging from institutions she mentions    Review of Systems: Patient complains of symptoms per HPI as well as the following symptoms: Eye pain, headache, feeling hot, feeling cold, joint pain, rash, moles, not enough sleep, depression, insomnia pertinent negatives and positives per HPI. All others negative.   Social History   Socioeconomic History  . Marital status: Single    Spouse name: Not on file  . Number of children: 1  . Years of education: Not on file  . Highest education level: Not on file  Occupational History  . Not on file  Social Needs  . Financial resource strain: Not on file  . Food insecurity    Worry: Not on file    Inability: Not on file  . Transportation needs    Medical: Not on file    Non-medical: Not on file  Tobacco Use  . Smoking status: Never Smoker  . Smokeless tobacco: Never Used  Substance and Sexual Activity  . Alcohol use: Yes    Comment: socially  . Drug use: No  . Sexual activity: Not on file  Lifestyle  . Physical activity    Days per week: Not on file    Minutes per session: Not on file  . Stress: Not on file  Relationships  . Social Herbalist on phone: Not on file    Gets together: Not on file    Attends religious service: Not on file    Active member of club or organization: Not on file    Attends meetings of clubs or organizations: Not on file    Relationship status: Not on file  . Intimate partner violence    Fear of current or ex partner: Not on file    Emotionally abused: Not on file    Physically abused: Not on file    Forced sexual activity: Not on file  Other Topics Concern  . Not on file  Social History Narrative  . Not on file    Family History  Problem Relation Age of Onset  . Ulcers Mother 81       peptic ulcer  . Prostate cancer Father 23  . Breast cancer Sister 1       ER+/PR+/Her2- breast cancer  . Breast cancer Paternal Grandmother    . Diabetes Paternal Grandmother   . Cancer Paternal Uncle        unknown cancers    Past Medical History:  Diagnosis Date  . Asthma   . Chronic pain   . Family history of breast cancer   . Family history of prostate cancer   . GERD (gastroesophageal reflux disease)   . High cholesterol   . IBS (irritable bowel syndrome)   . Migraine   . Occipital neuralgia   . Osteoarthritis   . Spinal stenosis   . Trigeminal neuralgia   . Trigeminal neuralgia     Past Surgical History:  Procedure Laterality Date  . ABDOMINAL HYSTERECTOMY    . ANKLE SURGERY Left 05/2009  . APPENDECTOMY    . BACK SURGERY    . 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 Pershing [facility]oud [facility]  . BRAIN SURGERY    .  CARPAL TUNNEL RELEASE  2014  . JOINT REPLACEMENT     R knee  . OVARIAN CYST REMOVAL  05/2010  . right knee revision  2016  . ROTATOR CUFF REPAIR    . SPINE SURGERY    . TUBAL LIGATION      Current Outpatient Medications  Medication Sig Dispense Refill  . acetaminophen (TYLENOL) 325 MG tablet Take 650 mg by mouth daily.    Marland Kitchen albuterol (VENTOLIN HFA) 108 (90 Base) MCG/ACT inhaler Inhale 2 puffs into the lungs every 6 (six) hours as needed for wheezing or shortness of breath. 18 g 11  . Albuterol Sulfate (PROAIR RESPICLICK) 889 (90 Base) MCG/ACT AEPB Inhale 2 puffs into the lungs every 6 (six) hours as needed. 1 each 6  . amitriptyline (ELAVIL) 50 MG tablet Take 50 mg by mouth at bedtime.    . Ascorbic Acid (VITAMIN C) 1000 MG tablet Take 1,000 mg by mouth daily.    . baclofen (LIORESAL) 10 MG tablet Take 10 mg by mouth daily.     . budesonide-formoterol (SYMBICORT) 80-4.5 MCG/ACT inhaler Inhale 2 puffs into the lungs every 12 (twelve) hours. 1 Inhaler 5  . calcium-vitamin D (OSCAL WITH D) 500-200 MG-UNIT tablet Take 1 tablet by mouth.    . calcium-vitamin D 250-100 MG-UNIT tablet Take 1 tablet by mouth 2 (two) times daily. Calcium 400 mg plus vitamin d     . celecoxib (CELEBREX) 200 MG capsule Take 200 mg by mouth daily.     . clotrimazole (MYCELEX) 10 MG troche Take 1 tablet (10 mg total) by mouth 4 (four) times daily. 16 tablet 0  . cyanocobalamin 1000 MCG tablet Take 1,000 mcg by mouth daily.    Marland Kitchen dexlansoprazole (DEXILANT) 60 MG capsule Take 60 mg by mouth daily.    Marland Kitchen ELDERBERRY PO Take by mouth.    . fenofibrate (TRICOR) 145 MG tablet Take 1 tablet (145 mg total) by mouth daily. 30 tablet 2  . fluticasone (FLONASE) 50 MCG/ACT nasal spray Place 2 sprays into both nostrils daily. 16 g 11  . gabapentin (NEURONTIN) 300 MG capsule Take 1-2 capsules (300-600 mg total) by mouth 3 (three) times daily. 180 capsule 11  . HYDROcodone-acetaminophen (NORCO/VICODIN) 5-325 MG tablet     . loratadine (CLARITIN) 10 MG tablet Take 10 mg by mouth daily.    Marland Kitchen lubiprostone (AMITIZA) 24 MCG capsule Take 24 mcg by mouth 2 (two) times daily with a meal.    . Melatonin 10 MG TABS Take by mouth.    . methocarbamol (ROBAXIN) 500 MG tablet TAKE 1 TABLET BY MOUTH EVERY 6 TO 8 HOURS AS NEEDED FOR SPASMS FOR 10 DAYS    . montelukast (SINGULAIR) 10 MG tablet Take 1 tablet (10 mg total) by mouth at bedtime. 30 tablet 11  . nystatin (MYCOSTATIN) 100000 UNIT/ML suspension Take 5 mLs (500,000 Units total) by mouth 3 (three) times daily. (Patient not taking: Reported on 05/14/2019) 140 mL 0  . OIL OF OREGANO PO Take by mouth.    . oxyCODONE-acetaminophen (PERCOCET/ROXICET) 5-325 MG tablet Take 1 tablet by mouth every 6 (six) hours.    . Simethicone 180 MG CAPS Take 1 capsule (180 mg total) by mouth 2 (two) times daily. (Patient taking differently: Take 180 mg by mouth as needed. ) 30 capsule 0  . Spacer/Aero-Holding Chambers (AEROCHAMBER MV) inhaler Use as instructed 1 each 0  . tapentadol (NUCYNTA ER) 100 MG 12 hr tablet Take 100 mg by mouth every 12 (twelve)  hours.    . topiramate (TOPAMAX) 100 MG tablet Take 100 mg by mouth 2 (two) times daily.    . traMADol (ULTRAM) 50 MG  tablet Take 1 tablet (50 mg total) by mouth every 4 (four) hours as needed (cough / sore throat). (Patient not taking: Reported on 05/14/2019) 40 tablet 0  . valACYclovir (VALTREX) 500 MG tablet Take 500 mg by mouth as needed.     . zinc gluconate 50 MG tablet Take 50 mg by mouth daily.     No current facility-administered medications for this visit.     Allergies as of 05/28/2019 - Review Complete 05/14/2019  Allergen Reaction Noted  . Aspirin  11/18/2016  . Codeine  07/17/2018  . Ibuprofen  11/18/2016  . Latex  11/18/2016  . Penicillins  11/18/2016    Vitals: There were no vitals taken for this visit. Last Weight:  Wt Readings from Last 1 Encounters:  05/14/19 162 lb 6.4 oz (73.7 kg)   Last Height:   Ht Readings from Last 1 Encounters:  05/14/19 '5\' 3"'  (1.6 m)   Physical exam: Exam: Gen: NAD, conversant, well nourised, well groomed                     CV: RRR, no MRG. No Carotid Bruits. No peripheral edema, warm, nontender Eyes: Conjunctivae clear without exudates or hemorrhage  Neuro: Detailed Neurologic Exam  Speech:    Speech is normal; fluent and spontaneous with normal comprehension.  Cognition:    The patient is oriented to person, place, and time;     recent and remote memory intact;     language fluent;     normal attention, concentration,     fund of knowledge Cranial Nerves:    The pupils are equal, round, and reactive to light. The fundi are normal and spontaneous venous pulsations are present. Visual fields are full to finger confrontation. Extraocular movements are intact. Trigeminal sensation is intact and the muscles of mastication are normal. The face is symmetric. The palate elevates in the midline. Hearing intact. Voice is normal. Shoulder shrug is normal. The tongue has normal motion without fasciculations.   Coordination:    Normal finger to nose and heel to shin. Normal rapid alternating movements.   Gait:    Heel-toe and tandem gait are normal.    Motor Observation:    No asymmetry, no atrophy, and no involuntary movements noted. Tone:    Normal muscle tone.    Posture:    Posture is normal. normal erect    Strength:    Strength is V/V in the upper and lower limbs.      Sensation: intact to LT     Reflex Exam:  DTR's:    Deep tendon reflexes in the upper and lower extremities are normal bilaterally.   Toes:    The toes are downgoing bilaterally.   Clonus:    Clonus is absent.       Assessment/Plan:  She says the gabapentin is helping, her occipital pain is not as bad, she responds to nerve blocks, she has spasms and dry needling helped. She has migraines, pulsating, spasms, pounding and thobbing, can be unilateral and start in the occipita area, light and sound sensitivity. Movement makes it worse. Significant neck pain and spasms. Daily headaches for > 2 years and >12 migraine days a month with nausea. Migraines last 24 hours. No aura. No medication overuse.  Tried: gabapentin, elavil(amitriptyline), celebrex, tylenol, topiramate, robaxin, flexeril, propranolol  -  she agrees to try Botox.  -Patient has been evaluated and treated by multiple institutions, neurologist and neurosurgeons including prestigious centers such as [place], [place], [place] [facility] [place].  She has had extensive imaging including MRIs with trigeminal protocol, MRAs, as well as decompressive surgery for vascular loop that was compressing the trigeminal nerve.  She has tried a multitude of medications in the past open but this is at one point she says 18 concurrent medications for her occipital trigeminal neuralgia) and right now she is on at least 4 different medications that treat these conditions including gabapentin, amitriptyline, baclofen, topiramate and she is being managed with narcotics through a chronic pain center.   - At this point we really have limited treatments left(see above). she has a history of migraines and symptoms could  be intertwined with a migraine disorder I recommend Botox which may help her migraine disorder as well as occipital and trigeminal pain.    I can provide sphenopalatine ganglion blocks. She can continue occipital nerve blocks at pain center.  I also recommend [facility] for radiofrequency ablation of the trigeminal and possibly the occipital nerve.  Cbc/cmp  No orders of the defined types were placed in this encounter.  Cc: Leeroy Cha, MD, Dr. Dian Situ  Sarina Ill, MD  [facility] [address] [place], [place] [address]  A total of 45 minutes was spent face-to-face with this patient. Over half this time was spent on counseling patient on the  1. Chronic migraine without aura, with intractable migraine, so stated, with status migrainosus    diagnosis and different diagnostic and therapeutic options, counseling and coordination of care, risks ans benefits of management, compliance, or risk factor reduction and education.    Phone [phone] Fax [phone]

## 2019-05-28 ENCOUNTER — Ambulatory Visit
Admission: RE | Admit: 2019-05-28 | Discharge: 2019-05-28 | Disposition: A | Discharge status: Home / Self Care | Payer: Medicare Other | Source: Ambulatory Visit | Attending: Pain Medicine | Admitting: Pain Medicine

## 2019-05-28 ENCOUNTER — Other Ambulatory Visit: Payer: Self-pay

## 2019-05-28 ENCOUNTER — Ambulatory Visit (INDEPENDENT_AMBULATORY_CARE_PROVIDER_SITE_OTHER): Payer: Medicare Other | Admitting: Neurology

## 2019-05-28 ENCOUNTER — Encounter: Payer: Self-pay | Admitting: Neurology

## 2019-05-28 VITALS — BP 132/92 | HR 92 | Temp 97.8°F | Ht 63.0 in | Wt 163.0 lb

## 2019-05-28 DIAGNOSIS — G43711 Chronic migraine without aura, intractable, with status migrainosus: Secondary | ICD-10-CM | POA: Diagnosis not present

## 2019-05-28 DIAGNOSIS — M545 Low back pain, unspecified: Secondary | ICD-10-CM

## 2019-05-28 DIAGNOSIS — M79604 Pain in right leg: Secondary | ICD-10-CM

## 2019-05-28 DIAGNOSIS — M25511 Pain in right shoulder: Secondary | ICD-10-CM | POA: Diagnosis not present

## 2019-05-28 DIAGNOSIS — M4807 Spinal stenosis, lumbosacral region: Secondary | ICD-10-CM | POA: Diagnosis not present

## 2019-05-28 MED ORDER — GADOBENATE DIMEGLUMINE 529 MG/ML IV SOLN
14.0000 mL | Freq: Once | INTRAVENOUS | Status: AC | PRN
  Administered 2019-05-28: 14 mL via INTRAVENOUS

## 2019-05-28 NOTE — Progress Notes (Signed)
Nerve block w/o steroid: Pt signed consent  0.5% Bupivocaine 1.5 mL LOT: [id number] EXP: 03/2020 NDC: [id number]  2% Lidocaine 1.5 mL LOT: [id number] EXP: 02/06/2020 NDC: [id number]

## 2019-05-30 ENCOUNTER — Telehealth: Payer: Self-pay | Admitting: Neurology

## 2019-05-30 DIAGNOSIS — M7502 Adhesive capsulitis of left shoulder: Secondary | ICD-10-CM | POA: Diagnosis not present

## 2019-05-30 DIAGNOSIS — G43711 Chronic migraine without aura, intractable, with status migrainosus: Secondary | ICD-10-CM | POA: Insufficient documentation

## 2019-05-30 DIAGNOSIS — M6281 Muscle weakness (generalized): Secondary | ICD-10-CM | POA: Diagnosis not present

## 2019-05-30 DIAGNOSIS — M25512 Pain in left shoulder: Secondary | ICD-10-CM | POA: Diagnosis not present

## 2019-05-30 NOTE — Telephone Encounter (Signed)
Patient has been scheduled with Amy Np. DW

## 2019-05-30 NOTE — Telephone Encounter (Signed)
Cassidy Olsen, Please start approval process for Botox. She can be with Amy thanks

## 2019-05-30 NOTE — Telephone Encounter (Signed)
I have started a botox packet. The consent form needs to be signed by pt at the first visit. The botox charge sheet is pending MD signature. Dx: [id number]

## 2019-05-30 NOTE — Telephone Encounter (Signed)
Botox charge sheet signed by Dr. Jaynee Eagles and given to [facility].

## 2019-05-31 DIAGNOSIS — M6281 Muscle weakness (generalized): Secondary | ICD-10-CM | POA: Diagnosis not present

## 2019-05-31 DIAGNOSIS — M7502 Adhesive capsulitis of left shoulder: Secondary | ICD-10-CM | POA: Diagnosis not present

## 2019-05-31 DIAGNOSIS — M25512 Pain in left shoulder: Secondary | ICD-10-CM | POA: Diagnosis not present

## 2019-06-03 DIAGNOSIS — M25512 Pain in left shoulder: Secondary | ICD-10-CM | POA: Diagnosis not present

## 2019-06-03 DIAGNOSIS — M7502 Adhesive capsulitis of left shoulder: Secondary | ICD-10-CM | POA: Diagnosis not present

## 2019-06-03 DIAGNOSIS — M6281 Muscle weakness (generalized): Secondary | ICD-10-CM | POA: Diagnosis not present

## 2019-06-05 DIAGNOSIS — G894 Chronic pain syndrome: Secondary | ICD-10-CM | POA: Diagnosis not present

## 2019-06-05 DIAGNOSIS — M5136 Other intervertebral disc degeneration, lumbar region: Secondary | ICD-10-CM | POA: Diagnosis not present

## 2019-06-05 DIAGNOSIS — M961 Postlaminectomy syndrome, not elsewhere classified: Secondary | ICD-10-CM | POA: Diagnosis not present

## 2019-06-05 DIAGNOSIS — M6281 Muscle weakness (generalized): Secondary | ICD-10-CM | POA: Diagnosis not present

## 2019-06-05 DIAGNOSIS — M25512 Pain in left shoulder: Secondary | ICD-10-CM | POA: Diagnosis not present

## 2019-06-05 DIAGNOSIS — M7502 Adhesive capsulitis of left shoulder: Secondary | ICD-10-CM | POA: Diagnosis not present

## 2019-06-05 DIAGNOSIS — M75101 Unspecified rotator cuff tear or rupture of right shoulder, not specified as traumatic: Secondary | ICD-10-CM | POA: Diagnosis not present

## 2019-06-05 NOTE — Telephone Encounter (Signed)
I called the patient informed her that her secondary offers not coverage for Botox. She stated she understood she would be responsible for anything medicare did not cover.

## 2019-06-05 NOTE — Telephone Encounter (Signed)
I called the patients secondary, Botox is not covered by the plan but her medicare will cover.

## 2019-06-06 ENCOUNTER — Ambulatory Visit (INDEPENDENT_AMBULATORY_CARE_PROVIDER_SITE_OTHER): Payer: Medicare Other | Admitting: Family Medicine

## 2019-06-06 ENCOUNTER — Other Ambulatory Visit: Payer: Self-pay

## 2019-06-06 ENCOUNTER — Encounter: Payer: Self-pay | Admitting: Family Medicine

## 2019-06-06 VITALS — Temp 97.0°F

## 2019-06-06 DIAGNOSIS — G43709 Chronic migraine without aura, not intractable, without status migrainosus: Secondary | ICD-10-CM | POA: Diagnosis not present

## 2019-06-06 MED ORDER — TOPIRAMATE 100 MG PO TABS: 100.0000 mg | ORAL_TABLET | Freq: Two times a day (BID) | ORAL | 3 refills | Status: DC

## 2019-06-06 MED ORDER — AMITRIPTYLINE HCL 50 MG PO TABS: 50.0000 mg | ORAL_TABLET | Freq: Every day | ORAL | 3 refills | Status: DC

## 2019-06-06 NOTE — Progress Notes (Signed)
Botox- 200 units x 4 vials Lot: [id number] Expiration: 12/2021 NDC: [id number]  Bacteriostatic 0.9% Sodium Chloride- 73mL total Lot: [id number] Expiration: 02/12/2020 NDC: [id number]  Dx: [id number] B/B

## 2019-06-06 NOTE — Progress Notes (Signed)
I have reviewed procedure including potential side effects with Storie.  Consent has been signed.  This is her first Botox appointment.  She has had multiple treatments in the past for migraines, occipital and trigeminal neuralgia.  Consent Form Botulism Toxin Injection For Chronic Migraine    Reviewed orally with patient, additionally signature is on file:  Botulism toxin has been approved by the Federal drug administration for treatment of chronic migraine. Botulism toxin does not cure chronic migraine and it may not be effective in some patients.  The administration of botulism toxin is accomplished by injecting a small amount of toxin into the muscles of the neck and head. Dosage must be titrated for each individual. Any benefits resulting from botulism toxin tend to wear off after 3 months with a repeat injection required if benefit is to be maintained. Injections are usually done every 3-4 months with maximum effect peak achieved by about 2 or 3 weeks. Botulism toxin is expensive and you should be sure of what costs you will incur resulting from the injection.  The side effects of botulism toxin use for chronic migraine may include:   -Transient, and usually mild, facial weakness with facial injections  -Transient, and usually mild, head or neck weakness with head/neck injections  -Reduction or loss of forehead facial animation due to forehead muscle weakness  -Eyelid drooping  -Dry eye  -Pain at the site of injection or bruising at the site of injection  -Double vision  -Potential unknown long term risks   Contraindications: You should not have Botox if you are pregnant, nursing, allergic to albumin, have an infection, skin condition, or muscle weakness at the site of the injection, or have myasthenia gravis, Lambert-Eaton syndrome, or ALS.  It is also possible that as with any injection, there may be an allergic reaction or no effect from the medication. Reduced effectiveness  after repeated injections is sometimes seen and rarely infection at the injection site may occur. All care will be taken to prevent these side effects. If therapy is given over a long time, atrophy and wasting in the muscle injected may occur. Occasionally the patient's become refractory to treatment because they develop antibodies to the toxin. In this event, therapy needs to be modified.  I have read the above information and consent to the administration of botulism toxin.    BOTOX PROCEDURE NOTE FOR MIGRAINE HEADACHE  Contraindications and precautions discussed with patient(above). Aseptic procedure was observed and patient tolerated procedure. Procedure performed by Debbora Presto, FNP-C.   The condition has existed for more than 6 months, and pt does not have a diagnosis of ALS, Myasthenia Gravis or Lambert-Eaton Syndrome.  Risks and benefits of injections discussed and pt agrees to proceed with the procedure.  Written consent obtained  These injections are medically necessary. Pt  receives good benefits from these injections. These injections do not cause sedations or hallucinations which the oral therapies may cause.   Description of procedure:  The patient was placed in a sitting position. The standard protocol was used for Botox as follows, with 5 units of Botox injected at each site:  -Procerus muscle, midline injection  -Corrugator muscle, bilateral injection  -Frontalis muscle, bilateral injection, with 2 sites each side, medial injection was performed in the upper one third of the frontalis muscle, in the region vertical from the medial inferior edge of the superior orbital rim. The lateral injection was again in the upper one third of the forehead vertically above the lateral  limbus of the cornea, 1.5 cm lateral to the medial injection site.  -Temporalis muscle injection, 4 sites, bilaterally. The first injection was 3 cm above the tragus of the ear, second injection site was 1.5  cm to 3 cm up from the first injection site in line with the tragus of the ear. The third injection site was 1.5-3 cm forward between the first 2 injection sites. The fourth injection site was 1.5 cm posterior to the second injection site. 5th site laterally in the temporalis  muscleat the level of the outer canthus.  -Occipitalis muscle injection, 3 sites, bilaterally. The first injection was done one half way between the occipital protuberance and the tip of the mastoid process behind the ear. The second injection site was done lateral and superior to the first, 1 fingerbreadth from the first injection. The third injection site was 1 fingerbreadth superiorly and medially from the first injection site.  -Cervical paraspinal muscle injection, 2 sites, bilateral knee first injection site was 1 cm from the midline of the cervical spine, 3 cm inferior to the lower border of the occipital protuberance. The second injection site was 1.5 cm superiorly and laterally to the first injection site.  -Trapezius muscle injection was performed at 3 sites, bilaterally. The first injection site was in the upper trapezius muscle halfway between the inflection point of the neck, and the acromion. The second injection site was one half way between the acromion and the first injection site. The third injection was done between the first injection site and the inflection point of the neck.   Will return for repeat injection in 3 months.   A 155 units of Botox was used, any Botox not injected was wasted. The patient tolerated the procedure well, there were no complications of the above procedure.

## 2019-06-07 DIAGNOSIS — M25512 Pain in left shoulder: Secondary | ICD-10-CM | POA: Diagnosis not present

## 2019-06-07 DIAGNOSIS — M6281 Muscle weakness (generalized): Secondary | ICD-10-CM | POA: Diagnosis not present

## 2019-06-07 DIAGNOSIS — M7502 Adhesive capsulitis of left shoulder: Secondary | ICD-10-CM | POA: Diagnosis not present

## 2019-06-10 DIAGNOSIS — M7502 Adhesive capsulitis of left shoulder: Secondary | ICD-10-CM | POA: Diagnosis not present

## 2019-06-10 DIAGNOSIS — M9904 Segmental and somatic dysfunction of sacral region: Secondary | ICD-10-CM | POA: Diagnosis not present

## 2019-06-10 DIAGNOSIS — M25512 Pain in left shoulder: Secondary | ICD-10-CM | POA: Diagnosis not present

## 2019-06-10 DIAGNOSIS — M6281 Muscle weakness (generalized): Secondary | ICD-10-CM | POA: Diagnosis not present

## 2019-06-10 DIAGNOSIS — Z981 Arthrodesis status: Secondary | ICD-10-CM | POA: Diagnosis not present

## 2019-06-10 DIAGNOSIS — M5136 Other intervertebral disc degeneration, lumbar region: Secondary | ICD-10-CM | POA: Diagnosis not present

## 2019-06-12 DIAGNOSIS — M25512 Pain in left shoulder: Secondary | ICD-10-CM | POA: Diagnosis not present

## 2019-06-12 DIAGNOSIS — M7502 Adhesive capsulitis of left shoulder: Secondary | ICD-10-CM | POA: Diagnosis not present

## 2019-06-12 DIAGNOSIS — M6281 Muscle weakness (generalized): Secondary | ICD-10-CM | POA: Diagnosis not present

## 2019-06-12 DIAGNOSIS — M7918 Myalgia, other site: Secondary | ICD-10-CM | POA: Diagnosis not present

## 2019-06-13 ENCOUNTER — Ambulatory Visit: Payer: Medicare Other

## 2019-06-25 ENCOUNTER — Ambulatory Visit: Payer: Medicare Other | Admitting: Family Medicine

## 2019-07-02 ENCOUNTER — Ambulatory Visit: Payer: Self-pay | Admitting: *Deleted

## 2019-07-02 DIAGNOSIS — F4325 Adjustment disorder with mixed disturbance of emotions and conduct: Secondary | ICD-10-CM | POA: Diagnosis not present

## 2019-07-02 NOTE — Telephone Encounter (Signed)
pt states that she is in [place] and will be traveling back to [place] on 07/06/19; she has been in [facility] since 06/14/2019, but has only been in contact with her immediate family (3 people); she is requesting information about testing for COVID; explained that routine testing is not required; she is not having symptoms; recommendations made per nurse triage protocol; she verbalized understanding; the pt sees Dr Delia Chimes, [facility]; will route to office for notification..   Reason for Disposition . [1] Living in area with community spread (identified by local [name]) BUT [2] NO symptoms  Answer Assessment - Initial Assessment Questions 1. COVID-19 CLOSE CONTACT: "Who is the person with the confirmed or suspected COVID-19 infection that you were exposed to?"      2. PLACE of CONTACT: "Where were you when you were exposed to COVID-19?" (e.g., home, school, medical waiting room; which city?)     3. TYPE of CONTACT: "How much contact was there?" (e.g., sitting next to, live in same house, work in same office, same building)     4. DURATION of CONTACT: "How long were you in contact with the COVID-19 patient?" (e.g., a few seconds, passed by person, a few minutes, 15 minutes or longer, live with the patient)    5. MASK: "Were you wearing a mask?" "Was the other person wearing a mask?" Note: wearing a mask reduces the risk of an  otherwise close contact.      6. DATE of CONTACT: "When did you have contact with a COVID-19 patient?" (e.g., how many days ago)   Pt has been in [place] since 06/14/2019 7. COMMUNITY SPREAD: "Are there lots of cases of COVID-19 (community spread) where you live?" (See public health department website, if unsure)       Major community spread 8. SYMPTOMS: "Do you have any symptoms?" (e.g., fever, cough, breathing difficulty, loss of taste or smell)    no 9. PREGNANCY OR POSTPARTUM: "Is there any chance you are pregnant?" "When was your last menstrual period?" "Did you deliver in  the last 2 weeks?"     10. HIGH RISK: "Do you have any heart or lung problems? Do you have a weak immune system?" (e.g., heart failure, COPD, asthma, HIV positive, chemotherapy, renal failure, diabetes mellitus, sickle cell anemia, obesity)      COPD 11.  TRAVEL: "Have you traveled out of the country recently?" If so, "When and where?"  Also ask about out-of-state travel, since the CDC has identified some high-risk cities for community spread in the [place].  Note: Travel becomes less relevant if there is widespread community transmission where the patient lives.       *No Answer*  Protocols used: CORONAVIRUS (COVID-19) EXPOSURE-A-AH

## 2019-07-08 ENCOUNTER — Encounter: Payer: Self-pay | Admitting: Family Medicine

## 2019-07-08 ENCOUNTER — Ambulatory Visit (INDEPENDENT_AMBULATORY_CARE_PROVIDER_SITE_OTHER): Payer: Medicare Other | Admitting: Family Medicine

## 2019-07-08 ENCOUNTER — Other Ambulatory Visit: Payer: Self-pay

## 2019-07-08 VITALS — BP 132/90 | HR 113 | Temp 98.9°F | Resp 17 | Ht 63.0 in | Wt 170.4 lb

## 2019-07-08 DIAGNOSIS — M5416 Radiculopathy, lumbar region: Secondary | ICD-10-CM | POA: Diagnosis not present

## 2019-07-08 DIAGNOSIS — K8020 Calculus of gallbladder without cholecystitis without obstruction: Secondary | ICD-10-CM | POA: Diagnosis not present

## 2019-07-08 DIAGNOSIS — M5136 Other intervertebral disc degeneration, lumbar region: Secondary | ICD-10-CM | POA: Diagnosis not present

## 2019-07-08 NOTE — Progress Notes (Signed)
Established Patient Office Visit  Subjective:  Patient ID: Cassidy Olsen, female    DOB: Jun 09, 1961  Age: 58 y.o. MRN: [id number]  CC:  Chronic Pain  HPI [name] presents for   Patient saw Dr. Rolena Infante for her radiating back pain which radiates down her right leg She states that she got an injection in the SI joint on the right side She states that on November 3rd she followed up with Dr. Rolena Infante She discussed this with Dr. Vira Blanco and they are discussion ablation for her lower back.  She reports that Dr. Rolena Infante told her to follow up if she would like to get surgery for her L3-L4 herniated disc She sees Dr. Jaynee Eagles for her migraines but has not discussed her back issues   She reports that her gastroenterologist told her she has gallstones which could be contributing to her pain She would like a consultation with general surgery for evaluation for elective surgery She denies fevers, chills, nausea or vomiting   Past Medical History:  Diagnosis Date  . Asthma   . Chronic pain   . Family history of breast cancer   . Family history of prostate cancer   . GERD (gastroesophageal reflux disease)   . High cholesterol   . IBS (irritable bowel syndrome)   . Migraine   . Occipital neuralgia   . Osteoarthritis   . Spinal stenosis   . Trigeminal neuralgia   . Trigeminal neuralgia     Past Surgical History:  Procedure Laterality Date  . ABDOMINAL HYSTERECTOMY    . ANKLE SURGERY Left 05/2009  . APPENDECTOMY    . BACK SURGERY    . 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 Pershing Proud at [facility]  . BRAIN SURGERY    . CARPAL TUNNEL RELEASE  2014  . JOINT REPLACEMENT     R knee  . OVARIAN CYST REMOVAL  05/2010  . right knee revision  2016  . ROTATOR CUFF REPAIR Right 02/2019  . SPINE SURGERY    . TUBAL LIGATION      Family History  Problem Relation Age of Onset  . Ulcers Mother 34       peptic ulcer  . Prostate cancer Father 36   . Breast cancer Sister 7       ER+/PR+/Her2- breast cancer  . Breast cancer Paternal Grandmother   . Diabetes Paternal Grandmother   . Cancer Paternal Uncle        unknown cancers    Social History   Socioeconomic History  . Marital status: Single    Spouse name: Not on file  . Number of children: 1  . Years of education: Not on file  . Highest education level: Not on file  Occupational History  . Not on file  Social Needs  . Financial resource strain: Not on file  . Food insecurity    Worry: Not on file    Inability: Not on file  . Transportation needs    Medical: Not on file    Non-medical: Not on file  Tobacco Use  . Smoking status: Never Smoker  . Smokeless tobacco: Never Used  Substance and Sexual Activity  . Alcohol use: Yes    Comment: socially  . Drug use: No  . Sexual activity: Not on file  Lifestyle  . Physical activity    Days per week: Not on file    Minutes per session: Not on file  . Stress:  Not on file  Relationships  . Social Herbalist on phone: Not on file    Gets together: Not on file    Attends religious service: Not on file    Active member of club or organization: Not on file    Attends meetings of clubs or organizations: Not on file    Relationship status: Not on file  . Intimate partner violence    Fear of current or ex partner: Not on file    Emotionally abused: Not on file    Physically abused: Not on file    Forced sexual activity: Not on file  Other Topics Concern  . Not on file  Social History Narrative   Lives at home alone   Right handed   Caffeine: 1 or 2 cups a week    Outpatient Medications Prior to Visit  Medication Sig Dispense Refill  . acetaminophen (TYLENOL) 325 MG tablet Take 650 mg by mouth daily.    Marland Kitchen albuterol (VENTOLIN HFA) 108 (90 Base) MCG/ACT inhaler Inhale 2 puffs into the lungs every 6 (six) hours as needed for wheezing or shortness of breath. 18 g 11  . Albuterol Sulfate (PROAIR RESPICLICK)  109 (90 Base) MCG/ACT AEPB Inhale 2 puffs into the lungs every 6 (six) hours as needed. 1 each 6  . amitriptyline (ELAVIL) 50 MG tablet Take 1 tablet (50 mg total) by mouth at bedtime. 90 tablet 3  . Ascorbic Acid (VITAMIN C) 1000 MG tablet Take 1,000 mg by mouth daily.    . budesonide-formoterol (SYMBICORT) 80-4.5 MCG/ACT inhaler Inhale 2 puffs into the lungs every 12 (twelve) hours. 1 Inhaler 5  . celecoxib (CELEBREX) 200 MG capsule Take 200 mg by mouth daily.     . cyanocobalamin 1000 MCG tablet Take 1,000 mcg by mouth daily.    Marland Kitchen dexlansoprazole (DEXILANT) 60 MG capsule Take 60 mg by mouth daily.    Marland Kitchen ELDERBERRY PO Take by mouth.    . fenofibrate (TRICOR) 145 MG tablet Take 1 tablet (145 mg total) by mouth daily. 30 tablet 2  . fluticasone (FLONASE) 50 MCG/ACT nasal spray Place 2 sprays into both nostrils daily. 16 g 11  . gabapentin (NEURONTIN) 300 MG capsule Take 1-2 capsules (300-600 mg total) by mouth 3 (three) times daily. 180 capsule 11  . loratadine (CLARITIN) 10 MG tablet Take 10 mg by mouth daily.    . Melatonin 10 MG TABS Take by mouth.    . methocarbamol (ROBAXIN) 500 MG tablet TAKE 1 TABLET BY MOUTH EVERY 6 TO 8 HOURS AS NEEDED FOR SPASMS FOR 10 DAYS    . montelukast (SINGULAIR) 10 MG tablet Take 1 tablet (10 mg total) by mouth at bedtime. 30 tablet 11  . MOVANTIK 25 MG TABS tablet Take 1 tablet by mouth daily.    . OIL OF OREGANO PO Take by mouth.    Marland Kitchen OVER THE COUNTER MEDICATION Calcium 400 + D    . Simethicone 180 MG CAPS Take 1 capsule (180 mg total) by mouth 2 (two) times daily. (Patient taking differently: Take 180 mg by mouth as needed. ) 30 capsule 0  . Spacer/Aero-Holding Chambers (AEROCHAMBER MV) inhaler Use as instructed 1 each 0  . tapentadol (NUCYNTA ER) 100 MG 12 hr tablet Take 100 mg by mouth every 12 (twelve) hours.    . topiramate (TOPAMAX) 100 MG tablet Take 1 tablet (100 mg total) by mouth 2 (two) times daily. 180 tablet 3  . valACYclovir (VALTREX) 500  MG  tablet Take 500 mg by mouth as needed.     Marland Kitchen VITAMIN D PO Take 1,000 mcg by mouth daily.    Marland Kitchen zinc gluconate 50 MG tablet Take 50 mg by mouth daily.     No facility-administered medications prior to visit.     Allergies  Allergen Reactions  . Aspirin   . Codeine     Cough meds with codeine-have withdraw symptoms when done  . Ibuprofen   . Latex   . Penicillins     ROS Review of Systems Review of Systems  Constitutional: Negative for activity change, appetite change, chills and fever.  HENT: Negative for congestion, nosebleeds, trouble swallowing and voice change.   Respiratory: Negative for cough, shortness of breath and wheezing.   Gastrointestinal: Negative for diarrhea, nausea and vomiting.  Genitourinary: Negative for difficulty urinating, dysuria, flank pain and hematuria.  Musculoskeletal: + back pain, without joint swelling and neck pain.  Neurological: Negative for dizziness, speech difficulty, light-headedness and numbness.  See HPI. All other review of systems negative.     Objective:    Physical Exam  BP 132/90 (BP Location: Left Arm, Patient Position: Sitting, Cuff Size: Normal)   Pulse (!) 113   Temp 98.9 F (37.2 C) (Oral)   Resp 17   Ht _0  (1.6 m)   Wt 170 lb 6.4 oz (77.3 kg)   SpO2 100%   BMI 30.19 kg/m  Wt Readings from Last 3 Encounters:  07/08/19 170 lb 6.4 oz (77.3 kg)  05/28/19 163 lb (73.9 kg)  05/14/19 162 lb 6.4 oz (73.7 kg)   Physical Exam  Constitutional: Oriented to person, place, and time. Appears well-developed and well-nourished.  HENT:  Head: Normocephalic and atraumatic.  Eyes: Conjunctivae and EOM are normal.  Cardiovascular: Normal rate, regular rhythm, normal heart sounds and intact distal pulses.  No murmur heard. Pulmonary/Chest: Effort normal and breath sounds normal. No stridor. No respiratory distress. Has no wheezes.  Neurological: Is alert and oriented to person, place, and time.  Skin: Skin is warm. Capillary  refill takes less than 2 seconds.  Psychiatric: Has a normal mood and affect. Behavior is normal. Judgment and thought content normal.    Health Maintenance Due  Topic Date Due  . Hepatitis C Screening  1960-10-02  . HIV Screening  12/16/1976  . PAP SMEAR-Modifier  12/17/1982  . COLONOSCOPY  12/17/2011    There are no preventive care reminders to display for this patient.  No results found for: TSH Lab Results  Component Value Date   WBC 8.1 04/18/2018   HGB 12.3 04/18/2018   HCT 36.5 04/18/2018   MCV 92.2 04/18/2018   PLT 429 (H) 04/18/2018   No results found for: NA, K, CHLORIDE, CO2, GLUCOSE, BUN, CREATININE, BILITOT, ALKPHOS, AST, ALT, PROT, ALBUMIN, CALCIUM, ANIONGAP, EGFR, GFR No results found for: CHOL No results found for: HDL No results found for: LDLCALC No results found for: TRIG No results found for: CHOLHDL No results found for: HGBA1C    Assessment & Plan:   Problem List Items Addressed This Visit    None    Visit Diagnoses    Degenerative disc disease, lumbar    -  Primary   Lumbar radiculopathy       Gallstones       Relevant Orders   Ambulatory referral to General Surgery    continue to discuss with Pain Mgmt and Neurology Discussed that she should see if Dr. Jaynee Eagles has any insights  She should continue with Pain Management  Referral placed a second time for general surgery  No orders of the defined types were placed in this encounter.   Follow-up: No follow-ups on file.    Forrest Moron, MD

## 2019-07-08 NOTE — Patient Instructions (Signed)
° ° ° °  If you have lab work done today you will be contacted with your lab results within the next 2 weeks.  If you have not heard from us then please contact us. The fastest way to get your results is to register for My Chart. ° ° °IF you received an x-ray today, you will receive an invoice from [facility]. Please contact [facility] at [phone] with questions or concerns regarding your invoice.  ° °IF you received labwork today, you will receive an invoice from [facility]. Please contact [facility] at [phone] with questions or concerns regarding your invoice.  ° °Our billing staff will not be able to assist you with questions regarding bills from these companies. ° °You will be contacted with the lab results as soon as they are available. The fastest way to get your results is to activate your My Chart account. Instructions are located on the last page of this paperwork. If you have not heard from us regarding the results in 2 weeks, please contact this office. °  ° ° ° °

## 2019-07-11 DIAGNOSIS — M961 Postlaminectomy syndrome, not elsewhere classified: Secondary | ICD-10-CM | POA: Diagnosis not present

## 2019-07-11 DIAGNOSIS — M47816 Spondylosis without myelopathy or radiculopathy, lumbar region: Secondary | ICD-10-CM | POA: Diagnosis not present

## 2019-07-11 DIAGNOSIS — M5136 Other intervertebral disc degeneration, lumbar region: Secondary | ICD-10-CM | POA: Diagnosis not present

## 2019-07-16 DIAGNOSIS — M9904 Segmental and somatic dysfunction of sacral region: Secondary | ICD-10-CM | POA: Diagnosis not present

## 2019-07-16 DIAGNOSIS — Z981 Arthrodesis status: Secondary | ICD-10-CM | POA: Diagnosis not present

## 2019-07-16 DIAGNOSIS — M549 Dorsalgia, unspecified: Secondary | ICD-10-CM | POA: Diagnosis not present

## 2019-07-16 DIAGNOSIS — M545 Low back pain: Secondary | ICD-10-CM | POA: Diagnosis not present

## 2019-07-22 ENCOUNTER — Telehealth: Payer: Self-pay | Admitting: Family Medicine

## 2019-07-22 ENCOUNTER — Ambulatory Visit (INDEPENDENT_AMBULATORY_CARE_PROVIDER_SITE_OTHER): Payer: Medicare Other

## 2019-07-22 ENCOUNTER — Encounter: Payer: Self-pay | Admitting: Family Medicine

## 2019-07-22 ENCOUNTER — Ambulatory Visit: Payer: Medicare Other | Admitting: Registered Nurse

## 2019-07-22 ENCOUNTER — Ambulatory Visit (INDEPENDENT_AMBULATORY_CARE_PROVIDER_SITE_OTHER): Payer: Medicare Other | Admitting: Family Medicine

## 2019-07-22 ENCOUNTER — Other Ambulatory Visit: Payer: Self-pay

## 2019-07-22 VITALS — BP 124/80 | HR 63 | Temp 97.9°F | Ht 63.0 in | Wt 167.0 lb

## 2019-07-22 DIAGNOSIS — K5904 Chronic idiopathic constipation: Secondary | ICD-10-CM

## 2019-07-22 DIAGNOSIS — N200 Calculus of kidney: Secondary | ICD-10-CM | POA: Diagnosis not present

## 2019-07-22 DIAGNOSIS — R109 Unspecified abdominal pain: Secondary | ICD-10-CM

## 2019-07-22 LAB — POCT URINALYSIS DIP (MANUAL ENTRY)
Bilirubin, UA: NEGATIVE
Blood, UA: NEGATIVE
Glucose, UA: NEGATIVE mg/dL
Ketones, POC UA: NEGATIVE mg/dL
Leukocytes, UA: NEGATIVE
Nitrite, UA: NEGATIVE
Protein Ur, POC: NEGATIVE mg/dL
Spec Grav, UA: 1.015 (ref 1.010–1.025)
Urobilinogen, UA: 0.2 E.U./dL
pH, UA: 5.5 (ref 5.0–8.0)

## 2019-07-22 NOTE — Telephone Encounter (Signed)
Pt wants her office notes and x-rays  From todays visit sent to [facility] office

## 2019-07-22 NOTE — Patient Instructions (Addendum)
  For constipation   Make sure you are drinking enough water daily. Make sure you are getting enough fiber in your diet - this will make you regular - you can eat high fiber foods or use metamucil as a supplement - it is really important to drink enough water when using fiber supplements.  If your stools are hard or are formed balls or you have to strain a stool softener will help - use colace 2-3 capsule a day  For gentle treatment of constipation Use Miralax 1-2 capfuls a day until your stools are soft and regular and then decrease the usage - you can use this daily  For more aggressive treatment of constipation Use 4 capfuls of Colace and 6 doses of Miralax and drink it in 2 hours - this should result in several watery stools - if it does not repeat the next day and then go to daily miralax for a week to make sure your bowels are clean and retrained to work properly  For the most aggressive treatment of constipation Use 14 capfuls of Miralax in 1 gallon of fluid (gatoraid or water work well or a combination of the two) and drink over 12h - it is ok to eat during this time and then use Miralax 1 capful daily for about 2 weeks to prevent the constipation from returning   If you have lab work done today you will be contacted with your lab results within the next 2 weeks.  If you have not heard from [place] then please contact us. The fastest way to get your results is to register for My Chart.   IF you received an x-ray today, you will receive an invoice from [facility]. Please contact [facility] at [phone] with questions or concerns regarding your invoice.   IF you received labwork today, you will receive an invoice from [facility]. Please contact [facility] at [phone] with questions or concerns regarding your invoice.   Our billing staff will not be able to assist you with questions regarding bills from these companies.  You will be contacted with the lab results as  soon as they are available. The fastest way to get your results is to activate your My Chart account. Instructions are located on the last page of this paperwork. If you have not heard from [place] regarding the results in 2 weeks, please contact this office.        If you have lab work done today you will be contacted with your lab results within the next 2 weeks.  If you have not heard from [place] then please contact us. The fastest way to get your results is to register for My Chart.   IF you received an x-ray today, you will receive an invoice from [facility]. Please contact [facility] at [phone] with questions or concerns regarding your invoice.   IF you received labwork today, you will receive an invoice from [facility]. Please contact [facility] at [phone] with questions or concerns regarding your invoice.   Our billing staff will not be able to assist you with questions regarding bills from these companies.  You will be contacted with the lab results as soon as they are available. The fastest way to get your results is to activate your My Chart account. Instructions are located on the last page of this paperwork. If you have not heard from [place] regarding the results in 2 weeks, please contact this office.

## 2019-07-22 NOTE — Progress Notes (Signed)
12/14/20202:06 PM  Cassidy Olsen 1961-04-24, 58 y.o., female [id number]  Chief Complaint  Patient presents with  . Pain    in stomach and back, reoccuring since Thanksgiving. Taking pepcid thinking it may be gas    HPI:   Patient is a 58 y.o. female with past medical history significant for migraines, asthma, trigeminal neuralgia who presents today for abdominal pain  Started having cramps on low back during thanksgiving, took APAP and heating pad, has back issues so she did not think much of it  Cramps came back a week ago Started radiating towards front and into her belly Could not get comfortable Nauseous but no vomiting Very exhausted  Was having BMs until yesterday Has chronic constipation managed by GI Drank ginger ale and chicken soup Food tasted really bad Took pepcid and simethicone Pain in her lower belly got better Pain better if sitting upright Worse with deep breathing But pain came back again last night Had bilateral kidney stone seen in pet scan, oct 2019 No dysuria or hematuria No fever or chills Has h/o spinal surgery   Depression screen [facility] 2/9 07/22/2019 05/14/2019  Decreased Interest 0 0  Down, Depressed, Hopeless 0 0  PHQ - 2 Score 0 0    Fall Risk  07/22/2019 05/14/2019  Falls in the past year? 0 0  Number falls in past yr: 0 0  Injury with Fall? 0 0  Follow up - Falls evaluation completed     Allergies  Allergen Reactions  . Aspirin   . Codeine     Cough meds with codeine-have withdraw symptoms when done  . Ibuprofen   . Latex   . Penicillins     Prior to Admission medications   Medication Sig Start Date End Date Taking? Authorizing Provider  acetaminophen (TYLENOL) 325 MG tablet Take 650 mg by mouth daily.   Yes [provider]  albuterol (VENTOLIN HFA) 108 (90 Base) MCG/ACT inhaler Inhale 2 puffs into the lungs every 6 (six) hours as needed for wheezing or shortness of breath. 05/13/19  Yes Icard, Octavio Graves, DO    amitriptyline (ELAVIL) 50 MG tablet Take 1 tablet (50 mg total) by mouth at bedtime. 06/06/19  Yes Lomax, Amy, NP  Ascorbic Acid (VITAMIN C) 1000 MG tablet Take 1,000 mg by mouth daily.   Yes [provider]  budesonide-formoterol (SYMBICORT) 80-4.5 MCG/ACT inhaler Inhale 2 puffs into the lungs every 12 (twelve) hours. 05/13/19  Yes Icard, Leory Plowman L, DO  celecoxib (CELEBREX) 200 MG capsule Take 200 mg by mouth daily.    Yes [provider]  cyanocobalamin 1000 MCG tablet Take 1,000 mcg by mouth daily.   Yes [provider]  dexlansoprazole (DEXILANT) 60 MG capsule Take 60 mg by mouth daily.   Yes [provider]  ELDERBERRY PO Take by mouth.   Yes [provider]  fluticasone (FLONASE) 50 MCG/ACT nasal spray Place 2 sprays into both nostrils daily. 05/13/19  Yes Icard, Bradley L, DO  gabapentin (NEURONTIN) 300 MG capsule Take 1-2 capsules (300-600 mg total) by mouth 3 (three) times daily. 05/01/19  Yes Lomax, Amy, NP  loratadine (CLARITIN) 10 MG tablet Take 10 mg by mouth daily.   Yes [provider]  Melatonin 10 MG TABS Take by mouth.   Yes [provider]  methocarbamol (ROBAXIN) 500 MG tablet TAKE 1 TABLET BY MOUTH EVERY 6 TO 8 HOURS AS NEEDED FOR SPASMS FOR 10 DAYS 02/11/19  Yes [provider]  montelukast (  SINGULAIR) 10 MG tablet Take 1 tablet (10 mg total) by mouth at bedtime. 05/13/19  Yes Icard, Bradley L, DO  MOVANTIK 25 MG TABS tablet Take 1 tablet by mouth daily. 05/14/19  Yes [provider]  OIL OF OREGANO PO Take by mouth.   Yes [provider]  OVER THE COUNTER MEDICATION Calcium 400 + D   Yes [provider]  Simethicone 180 MG CAPS Take 1 capsule (180 mg total) by mouth 2 (two) times daily. Patient taking differently: Take 180 mg by mouth as needed.  06/15/18  Yes Starr Lake, CNM  Spacer/Aero-Holding Chambers (AEROCHAMBER MV) inhaler Use as instructed 04/18/18  Yes Icard,  Octavio Graves, DO  tapentadol (NUCYNTA ER) 100 MG 12 hr tablet Take 100 mg by mouth every 12 (twelve) hours.   Yes [provider]  topiramate (TOPAMAX) 100 MG tablet Take 1 tablet (100 mg total) by mouth 2 (two) times daily. 06/06/19  Yes Lomax, Amy, NP  valACYclovir (VALTREX) 500 MG tablet Take 500 mg by mouth as needed.    Yes [provider]  VITAMIN D PO Take 1,000 mcg by mouth daily.   Yes [provider]  zinc gluconate 50 MG tablet Take 50 mg by mouth daily.   Yes [provider]  Albuterol Sulfate (PROAIR RESPICLICK) 465 (90 Base) MCG/ACT AEPB Inhale 2 puffs into the lungs every 6 (six) hours as needed. 05/14/19 06/13/19  Garner Nash, DO    Past Medical History:  Diagnosis Date  . Asthma   . Chronic pain   . Family history of breast cancer   . Family history of prostate cancer   . GERD (gastroesophageal reflux disease)   . High cholesterol   . IBS (irritable bowel syndrome)   . Migraine   . Occipital neuralgia   . Osteoarthritis   . Spinal stenosis   . Trigeminal neuralgia   . Trigeminal neuralgia     Past Surgical History:  Procedure Laterality Date  . ABDOMINAL HYSTERECTOMY    . ANKLE SURGERY Left 05/2009  . APPENDECTOMY    . BACK SURGERY    . 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 Pershing Proud at [facility]  . BRAIN SURGERY    . CARPAL TUNNEL RELEASE  2014  . JOINT REPLACEMENT     R knee  . OVARIAN CYST REMOVAL  05/2010  . right knee revision  2016  . ROTATOR CUFF REPAIR Right 02/2019  . SPINE SURGERY    . TUBAL LIGATION      Social History   Tobacco Use  . Smoking status: Never Smoker  . Smokeless tobacco: Never Used  Substance Use Topics  . Alcohol use: Yes    Comment: socially    Family History  Problem Relation Age of Onset  . Ulcers Mother 27       peptic ulcer  . Prostate cancer Father 57  . Breast cancer Sister 70       ER+/PR+/Her2- breast cancer  . Breast cancer  Paternal Grandmother   . Diabetes Paternal Grandmother   . Cancer Paternal Uncle        unknown cancers    ROS Per hpi  OBJECTIVE:  Today's Vitals   07/22/19 1346  BP: 124/80  Pulse: 63  Temp: 97.9 F (36.6 C)  SpO2: 100%  Weight: 167 lb (75.8 kg)  Height: '5\' 3"'  (1.6 m)   Body mass index is 29.58 kg/m.  Physical Exam Vitals and nursing note reviewed.  Constitutional:      Appearance: She is well-developed.  HENT:     Head: Normocephalic and atraumatic.     Mouth/Throat:     Pharynx: No oropharyngeal exudate.  Eyes:     General: No scleral icterus.    Conjunctiva/sclera: Conjunctivae normal.     Pupils: Pupils are equal, round, and reactive to light.  Cardiovascular:     Rate and Rhythm: Normal rate and regular rhythm.     Heart sounds: Normal heart sounds. No murmur. No friction rub. No gallop.   Pulmonary:     Effort: Pulmonary effort is normal.     Breath sounds: Normal breath sounds. No wheezing or rales.  Abdominal:     General: Abdomen is protuberant. Bowel sounds are normal. There is no distension.     Palpations: Abdomen is soft. There is no hepatomegaly, splenomegaly or mass.     Tenderness: There is abdominal tenderness in the epigastric area, left upper quadrant and left lower quadrant. There is left CVA tenderness. There is no right CVA tenderness, guarding or rebound.  Musculoskeletal:     Cervical back: Normal and neck supple.     Thoracic back: Tenderness and bony tenderness present. No spasms.     Lumbar back: Tenderness and bony tenderness present. No spasms.  Skin:    General: Skin is warm and dry.  Neurological:     Mental Status: She is alert and oriented to person, place, and time.     Results for orders placed or performed in visit on 07/22/19 (from the past 24 hour(s))  POCT urine dipstick     Status: Normal   Collection Time: 07/22/19  2:16 PM  Result Value Ref Range   Color, UA yellow yellow   Clarity, UA clear clear   Glucose,  UA negative negative mg/dL   Bilirubin, UA negative negative   Ketones, POC UA negative negative mg/dL   Spec Grav, UA 1.015 1.010 - 1.025   Blood, UA negative negative   pH, UA 5.5 5.0 - 8.0   Protein Ur, POC negative negative mg/dL   Urobilinogen, UA 0.2 0.2 or 1.0 E.U./dL   Nitrite, UA Negative Negative   Leukocytes, UA Negative Negative    DG Thoracic Spine 2 View  Result Date: 07/22/2019 CLINICAL DATA:  Severe flank pain EXAM: THORACIC SPINE 2 VIEWS COMPARISON:  None. FINDINGS: Normal alignment. No fracture or focal bone lesion. Disc spaces maintained. IMPRESSION: No acute bony abnormality. Electronically Signed   By: Rolm Baptise M.D.   On: 07/22/2019 14:49   DG Abd 2 Views  Result Date: 07/22/2019 CLINICAL DATA:  Abdominal pain, left flank pain EXAM: ABDOMEN - 2 VIEW COMPARISON:  None. FINDINGS: Moderate stool burden throughout the colon. Multiple calcifications project over the kidneys bilaterally, likely renal stones. No suspicious calcification along the expected course of the ureters or the bladder. Nonobstructive bowel gas pattern. No organomegaly or free air. IMPRESSION: Rounded calcifications project over both kidneys, likely bilateral renal stones. Moderate stool burden throughout the colon.  The Electronically Signed   By: Rolm Baptise M.D.   On: 07/22/2019 14:47     ASSESSMENT and PLAN  1. Flank pain, acute Per exam component of MSK, discussed supportive measures - DG Abd 2 Views; Future - DG Thoracic Spine 2 View; Future  2. Abdominal pain, unspecified abdominal location - POCT urine dipstick  3. Bilateral renal stones - Ambulatory referral to Urology  4. Chronic idiopathic  constipation miralax cleanse. Reach out to GI  Return if symptoms worsen or fail to improve.    Rutherford Guys, MD [facility] [place], [place] [address] Ph.  [phone] Fax [phone]

## 2019-07-23 NOTE — Telephone Encounter (Signed)
Can you send ov notes and x rays from 07/23/2019 to dr. Lorie Apley office for pt.

## 2019-07-24 ENCOUNTER — Other Ambulatory Visit: Payer: Self-pay

## 2019-07-24 ENCOUNTER — Encounter (HOSPITAL_COMMUNITY): Payer: Self-pay

## 2019-07-24 ENCOUNTER — Observation Stay (HOSPITAL_COMMUNITY)
Admission: EM | Admit: 2019-07-24 | Discharge: 2019-07-25 | Disposition: A | Discharge status: Home / Self Care | Payer: Medicare Other | Attending: Urology | Admitting: Urology

## 2019-07-24 ENCOUNTER — Telehealth: Payer: Self-pay

## 2019-07-24 ENCOUNTER — Emergency Department (HOSPITAL_COMMUNITY): Payer: Medicare Other

## 2019-07-24 ENCOUNTER — Ambulatory Visit: Payer: Self-pay | Admitting: *Deleted

## 2019-07-24 DIAGNOSIS — Z79899 Other long term (current) drug therapy: Secondary | ICD-10-CM | POA: Diagnosis not present

## 2019-07-24 DIAGNOSIS — Z791 Long term (current) use of non-steroidal anti-inflammatories (NSAID): Secondary | ICD-10-CM | POA: Diagnosis not present

## 2019-07-24 DIAGNOSIS — K219 Gastro-esophageal reflux disease without esophagitis: Secondary | ICD-10-CM | POA: Insufficient documentation

## 2019-07-24 DIAGNOSIS — Z20828 Contact with and (suspected) exposure to other viral communicable diseases: Secondary | ICD-10-CM | POA: Insufficient documentation

## 2019-07-24 DIAGNOSIS — J45909 Unspecified asthma, uncomplicated: Secondary | ICD-10-CM | POA: Diagnosis not present

## 2019-07-24 DIAGNOSIS — G43709 Chronic migraine without aura, not intractable, without status migrainosus: Secondary | ICD-10-CM | POA: Diagnosis not present

## 2019-07-24 DIAGNOSIS — N132 Hydronephrosis with renal and ureteral calculous obstruction: Principal | ICD-10-CM | POA: Insufficient documentation

## 2019-07-24 DIAGNOSIS — N201 Calculus of ureter: Secondary | ICD-10-CM | POA: Diagnosis present

## 2019-07-24 DIAGNOSIS — Z7951 Long term (current) use of inhaled steroids: Secondary | ICD-10-CM | POA: Insufficient documentation

## 2019-07-24 DIAGNOSIS — N200 Calculus of kidney: Secondary | ICD-10-CM | POA: Diagnosis present

## 2019-07-24 LAB — LACTIC ACID, PLASMA: Lactic Acid, Venous: 0.7 mmol/L (ref 0.5–1.9)

## 2019-07-24 LAB — COMPREHENSIVE METABOLIC PANEL
ALT: 13 U/L (ref 0–44)
AST: 17 U/L (ref 15–41)
Albumin: 3.9 g/dL (ref 3.5–5.0)
Alkaline Phosphatase: 51 U/L (ref 38–126)
Anion gap: 11 (ref 5–15)
BUN: 24 mg/dL — ABNORMAL HIGH (ref 6–20)
CO2: 23 mmol/L (ref 22–32)
Calcium: 9.9 mg/dL (ref 8.9–10.3)
Chloride: 104 mmol/L (ref 98–111)
Creatinine, Ser: 1.98 mg/dL — ABNORMAL HIGH (ref 0.44–1.00)
GFR calc Af Amer: 32 mL/min — ABNORMAL LOW (ref >60)
GFR calc non Af Amer: 27 mL/min — ABNORMAL LOW (ref >60)
Glucose, Bld: 102 mg/dL — ABNORMAL HIGH (ref 70–99)
Potassium: 4 mmol/L (ref 3.5–5.1)
Sodium: 138 mmol/L (ref 135–145)
Total Bilirubin: 0.6 mg/dL (ref 0.3–1.2)
Total Protein: 8.4 g/dL — ABNORMAL HIGH (ref 6.5–8.1)

## 2019-07-24 LAB — URINALYSIS, ROUTINE W REFLEX MICROSCOPIC
Bacteria, UA: NONE SEEN
Bilirubin Urine: NEGATIVE
Glucose, UA: NEGATIVE mg/dL
Hgb urine dipstick: NEGATIVE
Ketones, ur: NEGATIVE mg/dL
Nitrite: NEGATIVE
Protein, ur: NEGATIVE mg/dL
Specific Gravity, Urine: 1.01 (ref 1.005–1.030)
pH: 6 (ref 5.0–8.0)

## 2019-07-24 LAB — CBC
HCT: 39.9 % (ref 36.0–46.0)
Hemoglobin: 13.1 g/dL (ref 12.0–15.0)
MCH: 31.2 pg (ref 26.0–34.0)
MCHC: 32.8 g/dL (ref 30.0–36.0)
MCV: 95 fL (ref 80.0–100.0)
Platelets: 447 10*3/uL — ABNORMAL HIGH (ref 150–400)
RBC: 4.2 MIL/uL (ref 3.87–5.11)
RDW: 13.5 % (ref 11.5–15.5)
WBC: 12.4 10*3/uL — ABNORMAL HIGH (ref 4.0–10.5)
nRBC: 0 % (ref 0.0–0.2)

## 2019-07-24 LAB — LIPASE, BLOOD: Lipase: 27 U/L (ref 11–51)

## 2019-07-24 MED ORDER — LACTATED RINGERS IV BOLUS
1000.0000 mL | Freq: Once | INTRAVENOUS | Status: AC
  Administered 2019-07-24: 1000 mL via INTRAVENOUS

## 2019-07-24 MED ORDER — HYDROMORPHONE HCL 1 MG/ML IJ SOLN
0.5000 mg | INTRAMUSCULAR | Status: DC | PRN
  Administered 2019-07-25: 1 mg via INTRAVENOUS
  Filled 2019-07-24: qty 1

## 2019-07-24 MED ORDER — SODIUM CHLORIDE 0.45 % IV SOLN: INTRAVENOUS | Status: DC

## 2019-07-24 MED ORDER — ZOLPIDEM TARTRATE 5 MG PO TABS: 5.0000 mg | ORAL_TABLET | Freq: Every evening | ORAL | Status: DC | PRN

## 2019-07-24 MED ORDER — KETOROLAC TROMETHAMINE 30 MG/ML IJ SOLN
30.0000 mg | Freq: Once | INTRAMUSCULAR | Status: AC
  Administered 2019-07-24: 23:00:00 30 mg via INTRAVENOUS
  Filled 2019-07-24: qty 1

## 2019-07-24 MED ORDER — CIPROFLOXACIN IN D5W 400 MG/200ML IV SOLN
400.0000 mg | Freq: Two times a day (BID) | INTRAVENOUS | Status: DC
  Administered 2019-07-25 (×2): 400 mg via INTRAVENOUS
  Filled 2019-07-24 (×2): qty 200

## 2019-07-24 MED ORDER — MORPHINE SULFATE (PF) 4 MG/ML IV SOLN
4.0000 mg | Freq: Once | INTRAVENOUS | Status: AC
  Administered 2019-07-24: 4 mg via INTRAVENOUS
  Filled 2019-07-24: qty 1

## 2019-07-24 MED ORDER — IOHEXOL 300 MG/ML  SOLN
75.0000 mL | Freq: Once | INTRAMUSCULAR | Status: AC | PRN
  Administered 2019-07-24: 75 mL via INTRAVENOUS

## 2019-07-24 MED ORDER — SODIUM CHLORIDE 0.9% FLUSH: 3.0000 mL | Freq: Once | INTRAVENOUS | Status: DC

## 2019-07-24 MED ORDER — ONDANSETRON HCL 4 MG/2ML IJ SOLN: 4.0000 mg | INTRAMUSCULAR | Status: DC | PRN

## 2019-07-24 MED ORDER — SODIUM CHLORIDE (PF) 0.9 % IJ SOLN
INTRAMUSCULAR | Status: AC
  Filled 2019-07-24: qty 50

## 2019-07-24 MED ORDER — OXYCODONE-ACETAMINOPHEN 5-325 MG PO TABS
1.0000 | ORAL_TABLET | ORAL | Status: DC | PRN
  Administered 2019-07-25: 1 via ORAL
  Filled 2019-07-24: qty 2

## 2019-07-24 NOTE — ED Triage Notes (Signed)
Per EMS- patient reports a history of IBS. Patient c/o abdominal pain and constipation. No BM x 6 days. Patient states she has had fleets enema and stool softeners.

## 2019-07-24 NOTE — Telephone Encounter (Signed)
Pt called saying that she is still not able to move bowels after using miralax in excess, eating foods that promote bowels movement and giving herself 2 enemas. Pt was informed that she really needs to go to the er for the level of tx she needs. She also asked that another referral for gi be placed for her. She says the gi doctor she went to dismissed her as a pt. Referral was placed.

## 2019-07-24 NOTE — Telephone Encounter (Signed)
I faxed her ov notes and her xray notes to Dr. Lorie Apley office and got a confirmation.

## 2019-07-24 NOTE — Telephone Encounter (Signed)
Pt called with complaints of stabbing abdominal pain; she saw the provider on 07/22/2019; she says is 10 out of 10, and constant; her LBM was 07/18/2019; she has no appetite; recommendations made per nurse triage protocol; she verbalized understanding,and does not want to go to the ED; the pt is seen by Dr Nolon Rod, [place]; she can be contacted at [phone]; will route to office for final disposition.   Reason for Disposition . [1] SEVERE pain (e.g., excruciating) AND [2] present > 1 hour  Answer Assessment - Initial Assessment Questions 1. LOCATION: "Where does it hurt?"      Left upper adjacent to bellow button 2. RADIATION: "Does the pain shoot anywhere else?" (e.g., chest, back) back 3. ONSET: "When did the pain begin?" (e.g., minutes, hours or days ago)     07/18/2019 4. SUDDEN: "Gradual or sudden onset?"  gradually 5. PATTERN "Does the pain come and go, or is it constant?"    - If constant: "Is it getting better, staying the same, or worsening?"      (Note: Constant means the pain never goes away completely; most serious pain is constant and it progresses)     - If intermittent: "How long does it last?" "Do you have pain now?"     (Note: Intermittent means the pain goes away completely between bouts)    constant 6. SEVERITY: "How bad is the pain?"  (e.g., Scale 1-10; mild, moderate, or severe)   - MILD (1-3): doesn't interfere with normal activities, abdomen soft and not tender to touch    - MODERATE (4-7): interferes with normal activities or awakens from sleep, tender to touch    - SEVERE (8-10): excruciating pain, doubled over, unable to do any normal activities    10 out of 10 7. RECURRENT SYMPTOM: "Have you ever had this type of abdominal pain before?" If so, ask: "When was the last time?" and "What happened that time?"      Seen in office 07/22/2019 8. CAUSE: "What do you think is causing the abdominal pain?"    no 9. RELIEVING/AGGRAVATING FACTORS: "What makes it better  or worse?" (e.g., movement, antacids, bowel movement)    Lay in one spot and stay still makes it better; exertion makes it worse 10. OTHER SYMPTOMS: "Has there been any vomiting, diarrhea, constipation, or urine problems?"     Constipation, nausea, metallic taste in mouth 11. PREGNANCY: "Is there any chance you are pregnant?" "When was your last menstrual period?" no  Protocols used: ABDOMINAL PAIN - [facility]

## 2019-07-24 NOTE — Telephone Encounter (Signed)
Please advise 

## 2019-07-24 NOTE — ED Provider Notes (Signed)
[facility] DEPT Provider Note   CSN: [id number] Arrival date & time: 07/24/19  1515     History Chief Complaint  Patient presents with  . Abdominal Pain  . Constipation    Cassidy Olsen is a 58 y.o. female.  HPI   Patient presents to the emergency room for evaluation of abdominal pain and constipation.  Patient has history of irritable bowel syndrome.  She had trouble with constipation off and on for a while.  Patient states around Thanksgiving she started having some low back spasms and cramping.  About a week ago it started rating towards the front of her abdomen.  Has had nausea but no vomiting.  Patient states the last several days she has not had a normal bowel movement.  She tried laxatives as well as enemas without relief.  Patient denies any dysuria hematuria.  She was not aware of any fevers but did have a low-grade temperature here in the emergency room.  Patient eventually did have a bowel movement today but she has continued to have pain in her abdomen that is getting more severe.  Past Medical History:  Diagnosis Date  . Asthma   . Chronic pain   . Family history of breast cancer   . Family history of prostate cancer   . GERD (gastroesophageal reflux disease)   . High cholesterol   . IBS (irritable bowel syndrome)   . Migraine   . Occipital neuralgia   . Osteoarthritis   . Spinal stenosis   . Trigeminal neuralgia   . Trigeminal neuralgia     Patient Active Problem List   Diagnosis Date Noted  . Calculus, ureter 07/24/2019  . Chronic migraine without aura, with intractable migraine, so stated, with status migrainosus 05/30/2019  . Chronic asthmatic bronchitis ([place]) 05/14/2019  . Excessive daytime sleepiness 01/08/2019  . Upper airway cough syndrome 11/12/2018  . Acute bacterial sinusitis 07/17/2018  . Asthma 07/17/2018  . Abnormal chest CT 07/17/2018  . Trigeminal neuralgia of right side of face 02/01/2018  . Occipital  neuralgia 02/01/2018  . Chronic migraine without aura without status migrainosus, not intractable 02/01/2018  . Family history of breast cancer   . Family history of prostate cancer     Past Surgical History:  Procedure Laterality Date  . ABDOMINAL HYSTERECTOMY    . ANKLE SURGERY Left 05/2009  . APPENDECTOMY    . BACK SURGERY    . 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 Pershing [facility]  . BRAIN SURGERY    . CARPAL TUNNEL RELEASE  2014  . JOINT REPLACEMENT     R knee  . OVARIAN CYST REMOVAL  05/2010  . right knee revision  2016  . ROTATOR CUFF REPAIR Right 02/2019  . SPINE SURGERY    . TUBAL LIGATION       OB History    Gravida  3   Para  1   Term      Preterm      AB  2   Living  1     SAB  1   TAB  1   Ectopic      Multiple      Live Births              Family History  Problem Relation Age of Onset  . Ulcers Mother 36       peptic ulcer  . Prostate cancer Father 30  .  Breast cancer Sister 75       ER+/PR+/Her2- breast cancer  . Breast cancer Paternal Grandmother   . Diabetes Paternal Grandmother   . Cancer Paternal Uncle        unknown cancers    Social History   Tobacco Use  . Smoking status: Never Smoker  . Smokeless tobacco: Never Used  Substance Use Topics  . Alcohol use: Yes    Comment: socially  . Drug use: No    Home Medications Prior to Admission medications   Medication Sig Start Date End Date Taking? Authorizing Provider  acetaminophen (TYLENOL) 325 MG tablet Take 650 mg by mouth daily.   Yes [provider]  albuterol (VENTOLIN HFA) 108 (90 Base) MCG/ACT inhaler Inhale 2 puffs into the lungs every 6 (six) hours as needed for wheezing or shortness of breath. 05/13/19  Yes Icard, Octavio Graves, DO  amitriptyline (ELAVIL) 50 MG tablet Take 1 tablet (50 mg total) by mouth at bedtime. 06/06/19  Yes Lomax, Amy, NP  Ascorbic Acid (VITAMIN C) 1000 MG tablet Take 1,000 mg by mouth  daily.   Yes [provider]  budesonide-formoterol (SYMBICORT) 80-4.5 MCG/ACT inhaler Inhale 2 puffs into the lungs every 12 (twelve) hours. 05/13/19  Yes Icard, Leory Plowman L, DO  celecoxib (CELEBREX) 200 MG capsule Take 200 mg by mouth daily.    Yes [provider]  cyanocobalamin 1000 MCG tablet Take 1,000 mcg by mouth daily.   Yes [provider]  dexlansoprazole (DEXILANT) 60 MG capsule Take 60 mg by mouth daily.   Yes [provider]  ELDERBERRY PO Take by mouth.   Yes [provider]  fluticasone (FLONASE) 50 MCG/ACT nasal spray Place 2 sprays into both nostrils daily. 05/13/19  Yes Icard, Bradley L, DO  gabapentin (NEURONTIN) 300 MG capsule Take 1-2 capsules (300-600 mg total) by mouth 3 (three) times daily. Patient taking differently: Take 300-600 mg by mouth 2 (two) times daily.  05/01/19  Yes Lomax, Amy, NP  loratadine (CLARITIN) 10 MG tablet Take 10 mg by mouth daily.   Yes [provider]  Melatonin 10 MG TABS Take 10 mg by mouth daily.    Yes [provider]  methocarbamol (ROBAXIN) 500 MG tablet Take 500 mg by mouth every 6 (six) hours as needed for muscle spasms.  02/11/19  Yes [provider]  montelukast (SINGULAIR) 10 MG tablet Take 1 tablet (10 mg total) by mouth at bedtime. 05/13/19  Yes Icard, Bradley L, DO  MOVANTIK 25 MG TABS tablet Take 1 tablet by mouth daily. 05/14/19  Yes [provider]  OIL OF OREGANO PO Take by mouth.   Yes [provider]  OVER THE COUNTER MEDICATION Calcium 400 + D   Yes [provider]  Simethicone 180 MG CAPS Take 1 capsule (180 mg total) by mouth 2 (two) times daily. Patient taking differently: Take 180 mg by mouth as needed.  06/15/18  Yes Starr Lake, CNM  tapentadol (NUCYNTA ER) 100 MG 12 hr tablet Take 100 mg by mouth every 12 (twelve) hours.   Yes [provider]  topiramate (TOPAMAX) 100 MG tablet Take 1 tablet (100 mg total) by  mouth 2 (two) times daily. 06/06/19  Yes Lomax, Amy, NP  valACYclovir (VALTREX) 500 MG tablet Take 500 mg by mouth as needed.    Yes [provider]  VITAMIN D PO Take 1,000 mcg by mouth daily.   Yes [provider]  zinc gluconate 50 MG  tablet Take 50 mg by mouth daily.   Yes [provider]  Albuterol Sulfate (PROAIR RESPICLICK) 741 (90 Base) MCG/ACT AEPB Inhale 2 puffs into the lungs every 6 (six) hours as needed. 05/14/19 06/13/19  Garner Nash, DO  Spacer/Aero-Holding Chambers (AEROCHAMBER MV) inhaler Use as instructed 04/18/18   Garner Nash, DO    Allergies    Aspirin, Codeine, Ibuprofen, Latex, and Penicillins  Review of Systems   Review of Systems  All other systems reviewed and are negative.   Physical Exam Updated Vital Signs BP (!) 150/91 (BP Location: Left Arm)   Pulse (!) 111   Temp 100 F (37.8 C) (Oral)   Resp (!) 23   Ht 1.6 m (_0 )   Wt 75.8 kg   SpO2 100%   BMI 29.58 kg/m   Physical Exam Vitals and nursing note reviewed.  Constitutional:      General: She is not in acute distress.    Appearance: She is well-developed.  HENT:     Head: Normocephalic and atraumatic.     Right Ear: External ear normal.     Left Ear: External ear normal.  Eyes:     General: No scleral icterus.       Right eye: No discharge.        Left eye: No discharge.     Conjunctiva/sclera: Conjunctivae normal.  Neck:     Trachea: No tracheal deviation.  Cardiovascular:     Rate and Rhythm: Normal rate and regular rhythm.  Pulmonary:     Effort: Pulmonary effort is normal. No respiratory distress.     Breath sounds: Normal breath sounds. No stridor. No wheezing or rales.  Abdominal:     General: Bowel sounds are normal. There is no distension.     Palpations: Abdomen is soft.     Tenderness: There is generalized abdominal tenderness. There is guarding. There is no rebound.  Musculoskeletal:        General: No tenderness.     Cervical back:  Neck supple.  Skin:    General: Skin is warm and dry.     Findings: No rash.  Neurological:     Mental Status: She is alert.     Cranial Nerves: No cranial nerve deficit (no facial droop, extraocular movements intact, no slurred speech).     Sensory: No sensory deficit.     Motor: No abnormal muscle tone or seizure activity.     Coordination: Coordination normal.     ED Results / Procedures / Treatments   Labs (all labs ordered are listed, but only abnormal results are displayed) Labs Reviewed  COMPREHENSIVE METABOLIC PANEL - Abnormal; Notable for the following components:      Result Value   Glucose, Bld 102 (*)    BUN 24 (*)    Creatinine, Ser 1.98 (*)    Total Protein 8.4 (*)    GFR calc non Af Amer 27 (*)    GFR calc Af Amer 32 (*)    All other components within normal limits  CBC - Abnormal; Notable for the following components:   WBC 12.4 (*)    Platelets 447 (*)    All other components within normal limits  URINALYSIS, ROUTINE W REFLEX MICROSCOPIC - Abnormal; Notable for the following components:   Leukocytes,Ua TRACE (*)    All other components within normal limits  LIPASE, BLOOD  LACTIC ACID, PLASMA  HIV ANTIBODY (ROUTINE TESTING W REFLEX)    EKG None  Radiology CT ABDOMEN PELVIS W CONTRAST  Result Date: 07/24/2019 CLINICAL DATA:  Left lower quadrant abdominal pain EXAM: CT ABDOMEN AND PELVIS WITH CONTRAST TECHNIQUE: Multidetector CT imaging of the abdomen and pelvis was performed using the standard protocol following bolus administration of intravenous contrast. CONTRAST:  54m OMNIPAQUE IOHEXOL 300 MG/ML  SOLN COMPARISON:  10/04/2016 FINDINGS: Lower chest: Bibasilar atelectasis. No acute findings within the lower chest. Hepatobiliary: Unchanged tiny cyst within the left hepatic lobe. Liver otherwise unremarkable. 5 mm gallstone layers dependently within the gallbladder lumen. Gallbladder appears otherwise unremarkable. No biliary dilatation. Pancreas:  Unremarkable. No pancreatic ductal dilatation or surrounding inflammatory changes. Spleen: Normal in size without focal abnormality. Adrenals/Urinary Tract: Adrenal glands unremarkable. 8 x 6 mm calculus at the left ureteropelvic junction resulting in mild-to-moderate left hydronephrosis. There is extensive left-sided urothelial enhancement and stranding. Delayed left nephrogram. Small volume perinephric fluid. Numerous nonobstructing right renal calculi, largest measures up to 9 mm. No right-sided hydronephrosis. Thickened appearance of the urinary bladder, particularly along its superior margin. Stomach/Bowel: Stomach is within normal limits. Appendix not visualized. No evidence of bowel wall thickening, distention, or inflammatory changes. Vascular/Lymphatic: No significant vascular findings are present. No enlarged abdominal or pelvic lymph nodes. Reproductive: Prior hysterectomy. Left adnexal cyst measures up to 4.3 cm (previously measured 5.2 cm). Right adnexa unremarkable. Other: No abdominal wall hernia. No pneumoperitoneum. Musculoskeletal: Prior fusion and posterior decompression at L5-S1. No acute osseous abnormality. IMPRESSION: 1. Obstructing 8 x 6 mm calculus at the left ureteropelvic junction resulting in mild-to-moderate left hydronephrosis with small volume perinephric fluid and stranding. Extensive left-sided urothelial enhancement and stranding raises suspicion for a superimposed infectious or inflammatory process. 2. Thickened appearance of the urinary bladder, particularly along its superior margin. Recommend correlation with urinalysis. 3. Multiple nonobstructing right renal calculi, largest measuring up to 9 mm. 4. Cholelithiasis without evidence for cholecystitis. 5. Previously seen left adnexal cyst has slightly decreased in size. Electronically Signed   By: NDavina PokeM.D.   On: 07/24/2019 21:50    Procedures Procedures (including critical care time)  Medications Ordered in  ED Medications  sodium chloride flush (NS) 0.9 % injection 3 mL (0 mLs Intravenous Hold 07/24/19 2007)  0.45 % sodium chloride infusion (has no administration in time range)  ciprofloxacin (CIPRO) IVPB 400 mg (has no administration in time range)  oxyCODONE-acetaminophen (PERCOCET/ROXICET) 5-325 MG per tablet 1-2 tablet (has no administration in time range)  HYDROmorphone (DILAUDID) injection 0.5-1 mg (has no administration in time range)  ondansetron (ZOFRAN) injection 4 mg (has no administration in time range)  zolpidem (AMBIEN) tablet 5 mg (has no administration in time range)  lactated ringers bolus 1,000 mL (0 mLs Intravenous Stopped 07/24/19 2236)  morphine 4 MG/ML injection 4 mg (4 mg Intravenous Given 07/24/19 2059)  iohexol (OMNIPAQUE) 300 MG/ML solution 75 mL (75 mLs Intravenous Contrast Given 07/24/19 2119)  sodium chloride (PF) 0.9 % injection (  Given by Other 07/24/19 2143)  ketorolac (TORADOL) 30 MG/ML injection 30 mg (30 mg Intravenous Given 07/24/19 2235)    ED Course  I have reviewed the triage vital signs and the nursing notes.  Pertinent labs & imaging results that were available during my care of the patient were reviewed by me and considered in my medical decision making (see chart for details).  Clinical Course as of Jul 23 2256  Wed Jul 24, 2019  2232 Pain still 8/10   [JK]  2247 Case discussed with Dr.  DDiona Fanti     [  JK]    Clinical Course User Index [JK] Dorie Rank, MD   MDM Rules/Calculators/A&P                     Patient presented to the ED for evaluation of abdominal pain.  Patient thought it was related to her constipation.  Was concerned about the possibility diverticulitis with her low-grade temperature and GI symptoms and significant abdominal pain.  She did have a slight leukocytosis and low-grade temp.  White blood cell count slightly elevated.  Urinalysis shows trace leukocyte esterase but no bacteria.  Not definitive for infection.  Patient CT  scan shows an obstructing ureteral stone.  She is having persistent pain.  Discussed the case with Dr. Diona Fanti.  He will bring him to the hospital.  He has ordered IV antibiotics.  No signs of sepsis at this time but will add on a lactic acid level monitor closely.  Patient be admitted to the hospital for further treatment  Final Clinical Impression(s) / ED Diagnoses Final diagnoses:  Ureteral stone with hydronephrosis      Dorie Rank, MD 07/24/19 2257

## 2019-07-24 NOTE — ED Notes (Signed)
Pt made ware that urine sample is needed

## 2019-07-24 NOTE — Addendum Note (Signed)
Addended by: Amalia Hailey on: 07/24/2019 11:17 AM   Modules accepted: Orders

## 2019-07-25 ENCOUNTER — Observation Stay (HOSPITAL_COMMUNITY): Payer: Medicare Other

## 2019-07-25 ENCOUNTER — Observation Stay (HOSPITAL_COMMUNITY): Payer: Medicare Other | Admitting: Anesthesiology

## 2019-07-25 ENCOUNTER — Other Ambulatory Visit: Payer: Self-pay | Admitting: Urology

## 2019-07-25 ENCOUNTER — Encounter (HOSPITAL_COMMUNITY)
Admission: EM | Disposition: A | Discharge status: Home / Self Care | Payer: Self-pay | Source: Home / Self Care | Attending: Emergency Medicine

## 2019-07-25 DIAGNOSIS — G43709 Chronic migraine without aura, not intractable, without status migrainosus: Secondary | ICD-10-CM | POA: Diagnosis not present

## 2019-07-25 DIAGNOSIS — K219 Gastro-esophageal reflux disease without esophagitis: Secondary | ICD-10-CM | POA: Diagnosis not present

## 2019-07-25 DIAGNOSIS — Z20828 Contact with and (suspected) exposure to other viral communicable diseases: Secondary | ICD-10-CM | POA: Diagnosis not present

## 2019-07-25 DIAGNOSIS — N132 Hydronephrosis with renal and ureteral calculous obstruction: Secondary | ICD-10-CM | POA: Diagnosis not present

## 2019-07-25 HISTORY — PX: CYSTOSCOPY W/ URETERAL STENT PLACEMENT: SHX1429

## 2019-07-25 LAB — HIV ANTIBODY (ROUTINE TESTING W REFLEX): HIV Screen 4th Generation wRfx: NONREACTIVE

## 2019-07-25 LAB — SARS CORONAVIRUS 2 (TAT 6-24 HRS): SARS Coronavirus 2: NEGATIVE

## 2019-07-25 SURGERY — CYSTOSCOPY, WITH RETROGRADE PYELOGRAM AND URETERAL STENT INSERTION
Anesthesia: General | Laterality: Left

## 2019-07-25 MED ORDER — ALBUTEROL SULFATE 108 (90 BASE) MCG/ACT IN AEPB: 2.0000 | INHALATION_SPRAY | Freq: Four times a day (QID) | RESPIRATORY_TRACT | Status: DC | PRN

## 2019-07-25 MED ORDER — ACETAMINOPHEN 500 MG PO TABS
ORAL_TABLET | ORAL | Status: AC
  Administered 2019-07-25: 1000 mg via ORAL
  Filled 2019-07-25: qty 2

## 2019-07-25 MED ORDER — MIDAZOLAM HCL 2 MG/2ML IJ SOLN
INTRAMUSCULAR | Status: AC
  Filled 2019-07-25: qty 2

## 2019-07-25 MED ORDER — FENTANYL CITRATE (PF) 100 MCG/2ML IJ SOLN
INTRAMUSCULAR | Status: AC
  Filled 2019-07-25: qty 2

## 2019-07-25 MED ORDER — AMITRIPTYLINE HCL 25 MG PO TABS
50.0000 mg | ORAL_TABLET | Freq: Every day | ORAL | Status: DC
  Administered 2019-07-25: 50 mg via ORAL
  Filled 2019-07-25: qty 2

## 2019-07-25 MED ORDER — IOHEXOL 300 MG/ML  SOLN
INTRAMUSCULAR | Status: DC | PRN
  Administered 2019-07-25: 10 mL via URETHRAL

## 2019-07-25 MED ORDER — MIDAZOLAM HCL 2 MG/2ML IJ SOLN
INTRAMUSCULAR | Status: DC | PRN
  Administered 2019-07-25: 2 mg via INTRAVENOUS

## 2019-07-25 MED ORDER — FENTANYL CITRATE (PF) 100 MCG/2ML IJ SOLN: 25.0000 ug | INTRAMUSCULAR | Status: DC | PRN

## 2019-07-25 MED ORDER — TOPIRAMATE 100 MG PO TABS
100.0000 mg | ORAL_TABLET | Freq: Two times a day (BID) | ORAL | Status: DC
  Administered 2019-07-25 (×2): 100 mg via ORAL
  Filled 2019-07-25 (×2): qty 1

## 2019-07-25 MED ORDER — METHOCARBAMOL 500 MG PO TABS
500.0000 mg | ORAL_TABLET | Freq: Four times a day (QID) | ORAL | Status: DC | PRN
  Administered 2019-07-25: 10:00:00 500 mg via ORAL
  Filled 2019-07-25: qty 1

## 2019-07-25 MED ORDER — STERILE WATER FOR IRRIGATION IR SOLN
Status: DC | PRN
  Administered 2019-07-25: 3000 mL

## 2019-07-25 MED ORDER — ONDANSETRON HCL 4 MG/2ML IJ SOLN
INTRAMUSCULAR | Status: DC | PRN
  Administered 2019-07-25: 4 mg via INTRAVENOUS

## 2019-07-25 MED ORDER — MOMETASONE FURO-FORMOTEROL FUM 100-5 MCG/ACT IN AERO
2.0000 | INHALATION_SPRAY | Freq: Two times a day (BID) | RESPIRATORY_TRACT | Status: DC
  Filled 2019-07-25: qty 8.8

## 2019-07-25 MED ORDER — CELECOXIB 200 MG PO CAPS
ORAL_CAPSULE | ORAL | Status: AC
  Administered 2019-07-25: 400 mg via ORAL
  Filled 2019-07-25: qty 2

## 2019-07-25 MED ORDER — SULFAMETHOXAZOLE-TRIMETHOPRIM 800-160 MG PO TABS: 1.0000 | ORAL_TABLET | Freq: Two times a day (BID) | ORAL | 0 refills | Status: DC

## 2019-07-25 MED ORDER — LIDOCAINE 2% (20 MG/ML) 5 ML SYRINGE
INTRAMUSCULAR | Status: DC | PRN
  Administered 2019-07-25: 80 mg via INTRAVENOUS

## 2019-07-25 MED ORDER — CELECOXIB 200 MG PO CAPS: 400.0000 mg | ORAL_CAPSULE | Freq: Once | ORAL | Status: AC

## 2019-07-25 MED ORDER — ACETAMINOPHEN 500 MG PO TABS: 1000.0000 mg | ORAL_TABLET | Freq: Once | ORAL | Status: AC

## 2019-07-25 MED ORDER — PROPOFOL 10 MG/ML IV BOLUS
INTRAVENOUS | Status: DC | PRN
  Administered 2019-07-25: 150 mg via INTRAVENOUS

## 2019-07-25 MED ORDER — FENTANYL CITRATE (PF) 100 MCG/2ML IJ SOLN
INTRAMUSCULAR | Status: DC | PRN
  Administered 2019-07-25: 50 ug via INTRAVENOUS

## 2019-07-25 MED ORDER — ALBUTEROL SULFATE HFA 108 (90 BASE) MCG/ACT IN AERS: 2.0000 | INHALATION_SPRAY | Freq: Four times a day (QID) | RESPIRATORY_TRACT | Status: DC | PRN

## 2019-07-25 MED ORDER — LORATADINE 10 MG PO TABS
10.0000 mg | ORAL_TABLET | Freq: Every day | ORAL | Status: DC
  Administered 2019-07-25: 10 mg via ORAL
  Filled 2019-07-25: qty 1

## 2019-07-25 MED ORDER — GABAPENTIN 300 MG PO CAPS
300.0000 mg | ORAL_CAPSULE | Freq: Two times a day (BID) | ORAL | Status: DC
  Administered 2019-07-25: 300 mg via ORAL
  Filled 2019-07-25: qty 1

## 2019-07-25 MED ORDER — FLUTICASONE PROPIONATE 50 MCG/ACT NA SUSP
2.0000 | Freq: Every day | NASAL | Status: DC
  Administered 2019-07-25: 2 via NASAL
  Filled 2019-07-25: qty 16

## 2019-07-25 MED ORDER — LACTATED RINGERS IV SOLN: INTRAVENOUS | Status: DC

## 2019-07-25 MED ORDER — OXYBUTYNIN CHLORIDE 5 MG PO TABS: 5.0000 mg | ORAL_TABLET | Freq: Three times a day (TID) | ORAL | 1 refills | Status: DC | PRN

## 2019-07-25 MED ORDER — PANTOPRAZOLE SODIUM 40 MG PO TBEC
40.0000 mg | DELAYED_RELEASE_TABLET | Freq: Every day | ORAL | Status: DC
  Administered 2019-07-25: 40 mg via ORAL
  Filled 2019-07-25: qty 1

## 2019-07-25 MED ORDER — DEXAMETHASONE SODIUM PHOSPHATE 10 MG/ML IJ SOLN
INTRAMUSCULAR | Status: DC | PRN
  Administered 2019-07-25: 8 mg via INTRAVENOUS

## 2019-07-25 MED ORDER — PROMETHAZINE HCL 25 MG/ML IJ SOLN: 6.2500 mg | INTRAMUSCULAR | Status: DC | PRN

## 2019-07-25 MED ORDER — MONTELUKAST SODIUM 10 MG PO TABS
10.0000 mg | ORAL_TABLET | Freq: Every day | ORAL | Status: DC
  Administered 2019-07-25: 10 mg via ORAL
  Filled 2019-07-25: qty 1

## 2019-07-25 MED ORDER — PROPOFOL 10 MG/ML IV BOLUS
INTRAVENOUS | Status: AC
  Filled 2019-07-25: qty 20

## 2019-07-25 SURGICAL SUPPLY — 12 items
BAG URO CATCHER STRL LF (MISCELLANEOUS) ×2 IMPLANT
CATH INTERMIT  6FR 70CM (CATHETERS) ×1 IMPLANT
CLOTH BEACON ORANGE TIMEOUT ST (SAFETY) ×2 IMPLANT
GLOVE BIOGEL M 8.0 STRL (GLOVE) ×4 IMPLANT
GOWN STRL REUS W/TWL XL LVL3 (GOWN DISPOSABLE) ×3 IMPLANT
GUIDEWIRE ANG ZIPWIRE 038X150 (WIRE) IMPLANT
GUIDEWIRE STR DUAL SENSOR (WIRE) ×2 IMPLANT
KIT TURNOVER KIT A (KITS) ×1 IMPLANT
MANIFOLD NEPTUNE II (INSTRUMENTS) ×2 IMPLANT
PACK CYSTO (CUSTOM PROCEDURE TRAY) ×2 IMPLANT
STENT CONTOUR 6FRX24X.038 (STENTS) ×1 IMPLANT
TUBING CONNECTING 10 (TUBING) ×2 IMPLANT

## 2019-07-25 NOTE — Progress Notes (Signed)
Went over discharge instructions w/ pt. Pt verbalized understanding and agreed w/ the plan.

## 2019-07-25 NOTE — Transfer of Care (Signed)
Immediate Anesthesia Transfer of Care Note  Patient: Cassidy Olsen  Procedure(s) Performed: CYSTOSCOPY WITH RETROGRADE PYELOGRAM/URETERAL STENT PLACEMENT (Left )  Patient Location: PACU  Anesthesia Type:General  Level of Consciousness: awake, alert  and oriented  Airway & Oxygen Therapy: Patient Spontanous Breathing and Patient connected to face mask oxygen  Post-op Assessment: Report given to RN, Post -op Vital signs reviewed and stable and Patient moving all extremities X 4  Post vital signs: Reviewed and stable  Last Vitals:  Vitals Value Taken Time  BP 130/85 07/25/19 1549  Temp 36.4 C 07/25/19 1549  Pulse 90 07/25/19 1550  Resp 14 07/25/19 1550  SpO2 100 % 07/25/19 1550  Vitals shown include unvalidated device data.  Last Pain:  Vitals:   07/25/19 1549  TempSrc:   PainSc: 0-No pain         Complications: No apparent anesthesia complications

## 2019-07-25 NOTE — Progress Notes (Signed)
Patient arrived to floor. Patient is A & O x 4 on RA, Patient complains of abdominal distention and discomfort. Assisted to the bathroom and back to bed. Educated on NPO status,  the floor and room.  Will cont to monitor.

## 2019-07-25 NOTE — Anesthesia Postprocedure Evaluation (Signed)
Anesthesia Post Note  Patient: Ramaya Odonohue  Procedure(s) Performed: CYSTOSCOPY WITH RETROGRADE PYELOGRAM/URETERAL STENT PLACEMENT (Left )     Patient location during evaluation: PACU Anesthesia Type: General Level of consciousness: awake and alert and oriented Pain management: pain level controlled Vital Signs Assessment: post-procedure vital signs reviewed and stable Respiratory status: spontaneous breathing, nonlabored ventilation and respiratory function stable Cardiovascular status: blood pressure returned to baseline Postop Assessment: no apparent nausea or vomiting Anesthetic complications: no    Last Vitals:  Vitals:   07/25/19 1615 07/25/19 1642  BP: 118/81 130/80  Pulse: 94 92  Resp: 19   Temp: 36.5 C 36.6 C  SpO2: 99% 99%    Last Pain:  Vitals:   07/25/19 1642  TempSrc: Oral  PainSc:                  Brennan Bailey

## 2019-07-25 NOTE — Anesthesia Preprocedure Evaluation (Addendum)
Anesthesia Evaluation  Patient identified by MRN, date of birth, ID band Patient awake    Reviewed: Allergy & Precautions, NPO status , Patient's Chart, lab work & pertinent test results  History of Anesthesia Complications Negative for: history of anesthetic complications  Airway Mallampati: II  TM Distance: >3 FB Neck ROM: Full    Dental no notable dental hx. (+) Dental Advisory Given   Pulmonary asthma ,    Pulmonary exam normal        Cardiovascular negative cardio ROS Normal cardiovascular exam     Neuro/Psych negative neurological ROS     GI/Hepatic Neg liver ROS, GERD  ,  Endo/Other  negative endocrine ROS  Renal/GU negative Renal ROS     Musculoskeletal negative musculoskeletal ROS (+)   Abdominal   Peds  Hematology negative hematology ROS (+)   Anesthesia Other Findings Day of surgery medications reviewed with the patient.  Reproductive/Obstetrics                            Anesthesia Physical Anesthesia Plan  ASA: II  Anesthesia Plan: General   Post-op Pain Management:    Induction: Intravenous  PONV Risk Score and Plan: 3 and Ondansetron, Dexamethasone and Scopolamine patch - Pre-op  Airway Management Planned: LMA  Additional Equipment:   Intra-op Plan:   Post-operative Plan: Extubation in OR  Informed Consent: I have reviewed the patients History and Physical, chart, labs and discussed the procedure including the risks, benefits and alternatives for the proposed anesthesia with the patient or authorized representative who has indicated his/her understanding and acceptance.     Dental advisory given  Plan Discussed with: CRNA and Anesthesiologist  Anesthesia Plan Comments:        Anesthesia Quick Evaluation

## 2019-07-25 NOTE — H&P (Signed)
H&P  Chief Complaint: Left-sided kidney stone  History of Present Illness: Cassidy Olsen is a 58 y.o. year old female who presented to the emergency room on 12/16 with a 6-day history of left flank/abdominal pain.  No prior history of passing kidney stones, but kidney stones were noted on prior abdominal imaging.  In the emergency room, she had attempted pain management including morphine and Toradol.  Unfortunately, these did not totally take care of her pain.  CT scan revealed a left upper ureteral stone with significant hydronephrosis.  Additionally, she had a low-grade fever despite having clear urine.  She is admitted at this time for pain management as well as possible/probable left double-J stent placement.  Past Medical History:  Diagnosis Date  . Asthma   . Chronic pain   . Family history of breast cancer   . Family history of prostate cancer   . GERD (gastroesophageal reflux disease)   . High cholesterol   . IBS (irritable bowel syndrome)   . Migraine   . Occipital neuralgia   . Osteoarthritis   . Spinal stenosis   . Trigeminal neuralgia   . Trigeminal neuralgia     Past Surgical History:  Procedure Laterality Date  . ABDOMINAL HYSTERECTOMY    . ANKLE SURGERY Left 05/2009  . APPENDECTOMY    . BACK SURGERY    . 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 Pershing Proud at [facility]  . BRAIN SURGERY    . CARPAL TUNNEL RELEASE  2014  . JOINT REPLACEMENT     R knee  . OVARIAN CYST REMOVAL  05/2010  . right knee revision  2016  . ROTATOR CUFF REPAIR Right 02/2019  . SPINE SURGERY    . TUBAL LIGATION      Home Medications:  Medications Prior to Admission  Medication Sig Dispense Refill  . acetaminophen (TYLENOL) 325 MG tablet Take 650 mg by mouth daily.    Marland Kitchen albuterol (VENTOLIN HFA) 108 (90 Base) MCG/ACT inhaler Inhale 2 puffs into the lungs every 6 (six) hours as needed for wheezing or shortness of breath. 18 g 11  . amitriptyline  (ELAVIL) 50 MG tablet Take 1 tablet (50 mg total) by mouth at bedtime. 90 tablet 3  . Ascorbic Acid (VITAMIN C) 1000 MG tablet Take 1,000 mg by mouth daily.    . budesonide-formoterol (SYMBICORT) 80-4.5 MCG/ACT inhaler Inhale 2 puffs into the lungs every 12 (twelve) hours. 1 Inhaler 5  . celecoxib (CELEBREX) 200 MG capsule Take 200 mg by mouth daily.     . cyanocobalamin 1000 MCG tablet Take 1,000 mcg by mouth daily.    Marland Kitchen dexlansoprazole (DEXILANT) 60 MG capsule Take 60 mg by mouth daily.    Marland Kitchen ELDERBERRY PO Take by mouth.    . fluticasone (FLONASE) 50 MCG/ACT nasal spray Place 2 sprays into both nostrils daily. 16 g 11  . gabapentin (NEURONTIN) 300 MG capsule Take 1-2 capsules (300-600 mg total) by mouth 3 (three) times daily. (Patient taking differently: Take 300-600 mg by mouth 2 (two) times daily. ) 180 capsule 11  . loratadine (CLARITIN) 10 MG tablet Take 10 mg by mouth daily.    . Melatonin 10 MG TABS Take 10 mg by mouth daily.     . methocarbamol (ROBAXIN) 500 MG tablet Take 500 mg by mouth every 6 (six) hours as needed for muscle spasms.     . montelukast (SINGULAIR) 10 MG tablet Take 1 tablet (10 mg  total) by mouth at bedtime. 30 tablet 11  . MOVANTIK 25 MG TABS tablet Take 1 tablet by mouth daily.    . OIL OF OREGANO PO Take by mouth.    Marland Kitchen OVER THE COUNTER MEDICATION Calcium 400 + D    . Simethicone 180 MG CAPS Take 1 capsule (180 mg total) by mouth 2 (two) times daily. (Patient taking differently: Take 180 mg by mouth as needed. ) 30 capsule 0  . tapentadol (NUCYNTA ER) 100 MG 12 hr tablet Take 100 mg by mouth every 12 (twelve) hours.    . topiramate (TOPAMAX) 100 MG tablet Take 1 tablet (100 mg total) by mouth 2 (two) times daily. 180 tablet 3  . valACYclovir (VALTREX) 500 MG tablet Take 500 mg by mouth as needed.     Marland Kitchen VITAMIN D PO Take 1,000 mcg by mouth daily.    Marland Kitchen zinc gluconate 50 MG tablet Take 50 mg by mouth daily.    . Albuterol Sulfate (PROAIR RESPICLICK) 654 (90 Base)  MCG/ACT AEPB Inhale 2 puffs into the lungs every 6 (six) hours as needed. 1 each 6  . Spacer/Aero-Holding Chambers (AEROCHAMBER MV) inhaler Use as instructed 1 each 0    Allergies:  Allergies  Allergen Reactions  . Aspirin     Stomach upset  . Codeine     Cough meds with codeine-have withdraw symptoms when done  . Ibuprofen     Stomach upset  . Latex     Vaginal irritation  . Penicillins Hives    Did it involve swelling of the face/tongue/throat, SOB, or low BP? No Did it involve sudden or severe rash/hives, skin peeling, or any reaction on the inside of your mouth or nose? Yes Did you need to seek medical attention at a hospital or doctor's office? No When did it last happen?2000 If all above answers are "NO", may proceed with cephalosporin use.     Family History  Problem Relation Age of Onset  . Ulcers Mother 1       peptic ulcer  . Prostate cancer Father 61  . Breast cancer Sister 33       ER+/PR+/Her2- breast cancer  . Breast cancer Paternal Grandmother   . Diabetes Paternal Grandmother   . Cancer Paternal Uncle        unknown cancers    Social History:  reports that she has never smoked. She has never used smokeless tobacco. She reports current alcohol use. She reports that she does not use drugs.  ROS: A complete review of systems was performed.  All systems are negative except for pertinent findings as noted.  Physical Exam:  Vital signs in last 24 hours: Temp:  [97.9 F (36.6 C)-100 F (37.8 C)] 99.3 F (37.4 C) (12/17 1152) Pulse Rate:  [87-120] 89 (12/17 1152) Resp:  [14-24] 20 (12/17 1152) BP: (102-150)/(59-102) 102/66 (12/17 1152) SpO2:  [94 %-100 %] 100 % (12/17 1152) Weight:  [75.8 kg] 75.8 kg (12/16 1531) General:  Alert and oriented, No acute distress HEENT: Normocephalic, atraumatic Neck: No JVD or lymphadenopathy Cardiovascular: Regular rate and rhythm Lungs: Clear bilaterally Abdomen: Soft, nontender, nondistended, no abdominal  masses Back: No CVA tenderness Extremities: No edema Neurologic: Grossly intact  Laboratory Data:  Results for orders placed or performed during the hospital encounter of 07/24/19 (from the past 24 hour(s))  Lipase, blood     Status: None   Collection Time: 07/24/19  4:21 PM  Result Value Ref Range   Lipase 27  11 - 51 U/L  Comprehensive metabolic panel     Status: Abnormal   Collection Time: 07/24/19  4:21 PM  Result Value Ref Range   Sodium 138 135 - 145 mmol/L   Potassium 4.0 3.5 - 5.1 mmol/L   Chloride 104 98 - 111 mmol/L   CO2 23 22 - 32 mmol/L   Glucose, Bld 102 (H) 70 - 99 mg/dL   BUN 24 (H) 6 - 20 mg/dL   Creatinine, Ser 1.98 (H) 0.44 - 1.00 mg/dL   Calcium 9.9 8.9 - 10.3 mg/dL   Total Protein 8.4 (H) 6.5 - 8.1 g/dL   Albumin 3.9 3.5 - 5.0 g/dL   AST 17 15 - 41 U/L   ALT 13 0 - 44 U/L   Alkaline Phosphatase 51 38 - 126 U/L   Total Bilirubin 0.6 0.3 - 1.2 mg/dL   GFR calc non Af Amer 27 (L) >60 mL/min   GFR calc Af Amer 32 (L) >60 mL/min   Anion gap 11 5 - 15  CBC     Status: Abnormal   Collection Time: 07/24/19  4:21 PM  Result Value Ref Range   WBC 12.4 (H) 4.0 - 10.5 K/uL   RBC 4.20 3.87 - 5.11 MIL/uL   Hemoglobin 13.1 12.0 - 15.0 g/dL   HCT 39.9 36.0 - 46.0 %   MCV 95.0 80.0 - 100.0 fL   MCH 31.2 26.0 - 34.0 pg   MCHC 32.8 30.0 - 36.0 g/dL   RDW 13.5 11.5 - 15.5 %   Platelets 447 (H) 150 - 400 K/uL   nRBC 0.0 0.0 - 0.2 %  Urinalysis, Routine w reflex microscopic     Status: Abnormal   Collection Time: 07/24/19  9:08 PM  Result Value Ref Range   Color, Urine YELLOW YELLOW   APPearance CLEAR CLEAR   Specific Gravity, Urine 1.010 1.005 - 1.030   pH 6.0 5.0 - 8.0   Glucose, UA NEGATIVE NEGATIVE mg/dL   Hgb urine dipstick NEGATIVE NEGATIVE   Bilirubin Urine NEGATIVE NEGATIVE   Ketones, ur NEGATIVE NEGATIVE mg/dL   Protein, ur NEGATIVE NEGATIVE mg/dL   Nitrite NEGATIVE NEGATIVE   Leukocytes,Ua TRACE (A) NEGATIVE   RBC / HPF 0-5 0 - 5 RBC/hpf   WBC, UA  0-5 0 - 5 WBC/hpf   Bacteria, UA NONE SEEN NONE SEEN   Mucus PRESENT   Lactate     Status: None   Collection Time: 07/24/19 10:48 PM  Result Value Ref Range   Lactic Acid, Venous 0.7 0.5 - 1.9 mmol/L  HIV Antibody (routine testing w rflx)     Status: None   Collection Time: 07/24/19 10:51 PM  Result Value Ref Range   HIV Screen 4th Generation wRfx NON REACTIVE NON REACTIVE  SARS CORONAVIRUS 2 (TAT 6-24 HRS) Nasopharyngeal Nasopharyngeal Swab     Status: None   Collection Time: 07/24/19 11:05 PM   Specimen: Nasopharyngeal Swab  Result Value Ref Range   SARS Coronavirus 2 NEGATIVE NEGATIVE   Recent Results (from the past 240 hour(s))  SARS CORONAVIRUS 2 (TAT 6-24 HRS) Nasopharyngeal Nasopharyngeal Swab     Status: None   Collection Time: 07/24/19 11:05 PM   Specimen: Nasopharyngeal Swab  Result Value Ref Range Status   SARS Coronavirus 2 NEGATIVE NEGATIVE Final    Comment: (NOTE) SARS-CoV-2 target nucleic acids are NOT DETECTED. The SARS-CoV-2 RNA is generally detectable in upper and lower respiratory specimens during the acute phase of infection. Negative results do  not preclude SARS-CoV-2 infection, do not rule out co-infections with other pathogens, and should not be used as the sole basis for treatment or other patient management decisions. Negative results must be combined with clinical observations, patient history, and epidemiological information. The expected result is Negative. Fact Sheet for Patients: SugarRoll.be Fact Sheet for Healthcare Providers: https://www.woods-mathews.com/ This test is not yet approved or cleared by the [place] FDA and  has been authorized for detection and/or diagnosis of SARS-CoV-2 by FDA under an Emergency Use Authorization (EUA). This EUA will remain  in effect (meaning this test can be used) for the duration of the COVID-19 declaration under Section 56 4(b)(1) of the Act, 21 U.S.C. section  360bbb-3(b)(1), unless the authorization is terminated or revoked sooner. Performed at [facility], [address], [place], [place] [address]    Creatinine: Recent Labs    07/24/19 1621  CREATININE 1.98*    Radiologic Imaging: CT ABDOMEN PELVIS W CONTRAST  Result Date: 07/24/2019 CLINICAL DATA:  Left lower quadrant abdominal pain EXAM: CT ABDOMEN AND PELVIS WITH CONTRAST TECHNIQUE: Multidetector CT imaging of the abdomen and pelvis was performed using the standard protocol following bolus administration of intravenous contrast. CONTRAST:  42m OMNIPAQUE IOHEXOL 300 MG/ML  SOLN COMPARISON:  10/04/2016 FINDINGS: Lower chest: Bibasilar atelectasis. No acute findings within the lower chest. Hepatobiliary: Unchanged tiny cyst within the left hepatic lobe. Liver otherwise unremarkable. 5 mm gallstone layers dependently within the gallbladder lumen. Gallbladder appears otherwise unremarkable. No biliary dilatation. Pancreas: Unremarkable. No pancreatic ductal dilatation or surrounding inflammatory changes. Spleen: Normal in size without focal abnormality. Adrenals/Urinary Tract: Adrenal glands unremarkable. 8 x 6 mm calculus at the left ureteropelvic junction resulting in mild-to-moderate left hydronephrosis. There is extensive left-sided urothelial enhancement and stranding. Delayed left nephrogram. Small volume perinephric fluid. Numerous nonobstructing right renal calculi, largest measures up to 9 mm. No right-sided hydronephrosis. Thickened appearance of the urinary bladder, particularly along its superior margin. Stomach/Bowel: Stomach is within normal limits. Appendix not visualized. No evidence of bowel wall thickening, distention, or inflammatory changes. Vascular/Lymphatic: No significant vascular findings are present. No enlarged abdominal or pelvic lymph nodes. Reproductive: Prior hysterectomy. Left adnexal cyst measures up to 4.3 cm (previously measured 5.2 cm). Right adnexa  unremarkable. Other: No abdominal wall hernia. No pneumoperitoneum. Musculoskeletal: Prior fusion and posterior decompression at L5-S1. No acute osseous abnormality. IMPRESSION: 1. Obstructing 8 x 6 mm calculus at the left ureteropelvic junction resulting in mild-to-moderate left hydronephrosis with small volume perinephric fluid and stranding. Extensive left-sided urothelial enhancement and stranding raises suspicion for a superimposed infectious or inflammatory process. 2. Thickened appearance of the urinary bladder, particularly along its superior margin. Recommend correlation with urinalysis. 3. Multiple nonobstructing right renal calculi, largest measuring up to 9 mm. 4. Cholelithiasis without evidence for cholecystitis. 5. Previously seen left adnexal cyst has slightly decreased in size. Electronically Signed   By: NDavina PokeM.D.   On: 07/24/2019 21:50    Impression/Assessment:  Left UPJ stone with significant hydronephrosis and persistent pain despite aggressive IV pain management  Plan:  The patient will be admitted for pain management  I have discussed cystoscopy, double-J stent placement with her.  With her having a fever, I do not recommend, at this time, stone extraction.  However, placing a stent will allow the patient to medically get back to normal with eventual ureteroscopy or shockwave lithotripsy at a later date.  SLillette BoxerDahlstedt 07/25/2019, 1:22 PM  SLillette Boxer Onita Pfluger MD

## 2019-07-25 NOTE — Anesthesia Procedure Notes (Signed)
Procedure Name: LMA Insertion Date/Time: 07/25/2019 3:23 PM Performed by: Niel Hummer, CRNA Pre-anesthesia Checklist: Patient identified, Emergency Drugs available, Suction available and Patient being monitored Patient Re-evaluated:Patient Re-evaluated prior to induction Oxygen Delivery Method: Circle system utilized Preoxygenation: Pre-oxygenation with 100% oxygen LMA: LMA flexible inserted LMA Size: 4.0 Dental Injury: Teeth and Oropharynx as per pre-operative assessment

## 2019-07-25 NOTE — Op Note (Signed)
Preoperative diagnosis: Left proximal ureteral stone with hydronephrosis and recent fever  Postoperative diagnosis: Same  Principal procedure: Cystoscopy, left retrograde ureteropyelogram, fluoroscopic interpretation, placement of 6 French by 24 contour double-J stent without tether  Surgeon: Treylon Henard  Anesthesia: General  Complications: None  Specimen: None  Drains: Above-mentioned stent  Indications: 58 year old female admitted early this morning with a left proximal ureteral stone.  Even though her urine did not appear significantly infected, she did have a low-grade temperature of 100 F.  She could not be made comfortable in the emergency room with both Toradol and morphine.  She was subsequently admitted for pain management and presents at this time for urgent stenting in advance of eventual second stage procedure such as shockwave lithotripsy or ureteroscopy to treat this upper ureteral stone.  I have discussed the procedure of double-J stent placement with her.  She understands the procedure as well as risks and complications which include but are not limited to infection, ureteral injury, anesthetic complications as well as stent placement and subsequent discomfort from this.  She desires to proceed.  Findings: Bladder inspection revealed normal urothelium with normally placed ureteral orifice ease that were normal in configuration.  Retrograde study with Omnipaque revealed a normal ureter up to the proximal ureter where there was a filling defect with significant obstruction with difficulty in getting the Omnipaque past this.  There was moderate pyelocaliectasis upon further filling.  Description of procedure: The patient was properly identified in the holding area.  She had been on preoperative Cipro.  Surgical side was marked.  She was taken to the operating room where general anesthetic was administered.  She was placed in the dorsolithotomy position.  Genitalia and perineum were  prepped and draped, proper timeout performed.  75 French panendoscope was advanced into the bladder with inspection of the bladder carried out.  Left retrograde ureteropyelogram was performed using a 6 [place] open-ended catheter and Omnipaque.  This revealed the above-mentioned findings.  Following this, sensor tip guidewire was advanced through the open-ended catheter, with some difficulty getting past the upper ureteral stone.  Once this was performed, and the guidewire was curled in the upper pole calyceal system, a 6 [place] by 24 cm contour double-J stent was advanced over top of the guidewire.  Once adequately positioned, it was deployed with removing the guidewire with excellent proximal distal curl seen with fluoroscopy and cystoscopy, respectively.  At this point the procedure was terminated.  The bladder was drained and the scope removed.  The patient was then awakened and taken to the PACU in stable condition, having tolerated procedure well.

## 2019-07-25 NOTE — Discharge Instructions (Signed)

## 2019-07-25 NOTE — Discharge Summary (Signed)
Patient ID: Cassidy Olsen MRN: [id number] DOB/AGE: Jun 10, 1961 58 y.o.  Admit date: 07/24/2019 Discharge date: 07/25/2019  Primary Care Physician:  Forrest Moron, MD  Discharge Diagnoses:  n20.1 Present on Admission: . Calculus, ureter   Consults:  None   Discharge Medications: Allergies as of 07/25/2019      Reactions   Aspirin    Stomach upset   Codeine    Cough meds with codeine-have withdraw symptoms when done   Ibuprofen    Stomach upset   Latex    Vaginal irritation   Penicillins Hives   Did it involve swelling of the face/tongue/throat, SOB, or low BP? No Did it involve sudden or severe rash/hives, skin peeling, or any reaction on the inside of your mouth or nose? Yes Did you need to seek medical attention at a hospital or doctor's office? No When did it last happen?2000 If all above answers are "NO", may proceed with cephalosporin use.      Medication List    TAKE these medications   acetaminophen 325 MG tablet Commonly known as: TYLENOL Take 650 mg by mouth daily.   AeroChamber MV inhaler Use as instructed   albuterol 108 (90 Base) MCG/ACT inhaler Commonly known as: VENTOLIN HFA Inhale 2 puffs into the lungs every 6 (six) hours as needed for wheezing or shortness of breath.   ProAir RespiClick [id number] (90 Base) MCG/ACT Aepb Generic drug: Albuterol Sulfate Inhale 2 puffs into the lungs every 6 (six) hours as needed.   amitriptyline 50 MG tablet Commonly known as: ELAVIL Take 1 tablet (50 mg total) by mouth at bedtime.   budesonide-formoterol 80-4.5 MCG/ACT inhaler Commonly known as: Symbicort Inhale 2 puffs into the lungs every 12 (twelve) hours.   celecoxib 200 MG capsule Commonly known as: CELEBREX Take 200 mg by mouth daily.   cyanocobalamin 1000 MCG tablet Take 1,000 mcg by mouth daily.   Dexilant 60 MG capsule Generic drug: dexlansoprazole Take 60 mg by mouth daily.   ELDERBERRY PO Take by mouth.   fluticasone 50 MCG/ACT  nasal spray Commonly known as: FLONASE Place 2 sprays into both nostrils daily.   gabapentin 300 MG capsule Commonly known as: NEURONTIN Take 1-2 capsules (300-600 mg total) by mouth 3 (three) times daily. What changed: when to take this   loratadine 10 MG tablet Commonly known as: CLARITIN Take 10 mg by mouth daily.   Melatonin 10 MG Tabs Take 10 mg by mouth daily.   methocarbamol 500 MG tablet Commonly known as: ROBAXIN Take 500 mg by mouth every 6 (six) hours as needed for muscle spasms.   montelukast 10 MG tablet Commonly known as: SINGULAIR Take 1 tablet (10 mg total) by mouth at bedtime.   Movantik 25 MG Tabs tablet Generic drug: naloxegol oxalate Take 1 tablet by mouth daily.   Nucynta ER 100 MG 12 hr tablet Generic drug: tapentadol Take 100 mg by mouth every 12 (twelve) hours.   OIL OF OREGANO PO Take by mouth.   OVER THE COUNTER MEDICATION Calcium 400 + D   oxybutynin 5 MG tablet Commonly known as: DITROPAN Take 1 tablet (5 mg total) by mouth every 8 (eight) hours as needed for bladder spasms.   Simethicone 180 MG Caps Take 1 capsule (180 mg total) by mouth 2 (two) times daily. What changed:   when to take this  reasons to take this   sulfamethoxazole-trimethoprim 800-160 MG tablet Commonly known as: BACTRIM DS Take 1 tablet by mouth 2 (two) times daily.  topiramate 100 MG tablet Commonly known as: TOPAMAX Take 1 tablet (100 mg total) by mouth 2 (two) times daily.   valACYclovir 500 MG tablet Commonly known as: VALTREX Take 500 mg by mouth as needed.   vitamin C 1000 MG tablet Take 1,000 mg by mouth daily.   VITAMIN D PO Take 1,000 mcg by mouth daily.   zinc gluconate 50 MG tablet Take 50 mg by mouth daily.        Significant Diagnostic Studies:  CT ABDOMEN PELVIS W CONTRAST  Result Date: 07/24/2019 CLINICAL DATA:  Left lower quadrant abdominal pain EXAM: CT ABDOMEN AND PELVIS WITH CONTRAST TECHNIQUE: Multidetector CT imaging of  the abdomen and pelvis was performed using the standard protocol following bolus administration of intravenous contrast. CONTRAST:  73mL OMNIPAQUE IOHEXOL 300 MG/ML  SOLN COMPARISON:  10/04/2016 FINDINGS: Lower chest: Bibasilar atelectasis. No acute findings within the lower chest. Hepatobiliary: Unchanged tiny cyst within the left hepatic lobe. Liver otherwise unremarkable. 5 mm gallstone layers dependently within the gallbladder lumen. Gallbladder appears otherwise unremarkable. No biliary dilatation. Pancreas: Unremarkable. No pancreatic ductal dilatation or surrounding inflammatory changes. Spleen: Normal in size without focal abnormality. Adrenals/Urinary Tract: Adrenal glands unremarkable. 8 x 6 mm calculus at the left ureteropelvic junction resulting in mild-to-moderate left hydronephrosis. There is extensive left-sided urothelial enhancement and stranding. Delayed left nephrogram. Small volume perinephric fluid. Numerous nonobstructing right renal calculi, largest measures up to 9 mm. No right-sided hydronephrosis. Thickened appearance of the urinary bladder, particularly along its superior margin. Stomach/Bowel: Stomach is within normal limits. Appendix not visualized. No evidence of bowel wall thickening, distention, or inflammatory changes. Vascular/Lymphatic: No significant vascular findings are present. No enlarged abdominal or pelvic lymph nodes. Reproductive: Prior hysterectomy. Left adnexal cyst measures up to 4.3 cm (previously measured 5.2 cm). Right adnexa unremarkable. Other: No abdominal wall hernia. No pneumoperitoneum. Musculoskeletal: Prior fusion and posterior decompression at L5-S1. No acute osseous abnormality. IMPRESSION: 1. Obstructing 8 x 6 mm calculus at the left ureteropelvic junction resulting in mild-to-moderate left hydronephrosis with small volume perinephric fluid and stranding. Extensive left-sided urothelial enhancement and stranding raises suspicion for a superimposed  infectious or inflammatory process. 2. Thickened appearance of the urinary bladder, particularly along its superior margin. Recommend correlation with urinalysis. 3. Multiple nonobstructing right renal calculi, largest measuring up to 9 mm. 4. Cholelithiasis without evidence for cholecystitis. 5. Previously seen left adnexal cyst has slightly decreased in size. Electronically Signed   By: Davina Poke M.D.   On: 07/24/2019 21:50   DG C-Arm 1-60 Min-No Report  Result Date: 07/25/2019 Fluoroscopy was utilized by the requesting physician.  No radiographic interpretation.    Brief H and P: For complete details please refer to admission H and P, but in brief pt was admitted for pain mgmt/ stent placement for mgmt of a symptomatic left prox ureteral stone w/ hydro and fever.  Hospital Course: Pt admitted for pain mgmt/abx, stent placememt. Following stent, pt remained afebrile, was d/ced home on abx to  followup in a week to plan further Rx Active Problems:   Calculus, ureter   Day of Discharge BP 118/81 (BP Location: Left Arm)   Pulse 94   Temp 97.7 F (36.5 C)   Resp 19   Ht 5\' 3"  (1.6 m)   Wt 75.8 kg   SpO2 99%   BMI 29.58 kg/m   Results for orders placed or performed during the hospital encounter of 07/24/19 (from the past 24 hour(s))  Urinalysis, Routine w reflex  microscopic     Status: Abnormal   Collection Time: 07/24/19  9:08 PM  Result Value Ref Range   Color, Urine YELLOW YELLOW   APPearance CLEAR CLEAR   Specific Gravity, Urine 1.010 1.005 - 1.030   pH 6.0 5.0 - 8.0   Glucose, UA NEGATIVE NEGATIVE mg/dL   Hgb urine dipstick NEGATIVE NEGATIVE   Bilirubin Urine NEGATIVE NEGATIVE   Ketones, ur NEGATIVE NEGATIVE mg/dL   Protein, ur NEGATIVE NEGATIVE mg/dL   Nitrite NEGATIVE NEGATIVE   Leukocytes,Ua TRACE (A) NEGATIVE   RBC / HPF 0-5 0 - 5 RBC/hpf   WBC, UA 0-5 0 - 5 WBC/hpf   Bacteria, UA NONE SEEN NONE SEEN   Mucus PRESENT   Lactate     Status: None   Collection  Time: 07/24/19 10:48 PM  Result Value Ref Range   Lactic Acid, Venous 0.7 0.5 - 1.9 mmol/L  HIV Antibody (routine testing w rflx)     Status: None   Collection Time: 07/24/19 10:51 PM  Result Value Ref Range   HIV Screen 4th Generation wRfx NON REACTIVE NON REACTIVE  SARS CORONAVIRUS 2 (TAT 6-24 HRS) Nasopharyngeal Nasopharyngeal Swab     Status: None   Collection Time: 07/24/19 11:05 PM   Specimen: Nasopharyngeal Swab  Result Value Ref Range   SARS Coronavirus 2 NEGATIVE NEGATIVE    Physical Exam: General: Alert and awake oriented x3 not in any acute distress. HEENT: anicteric sclera, pupils reactive to light and accommodation CVS: S1-S2 clear no murmur rubs or gallops Chest: clear to auscultation bilaterally, no wheezing rales or rhonchi Abdomen: soft nontender, nondistended, normal bowel sounds, no organomegaly Extremities: no cyanosis, clubbing or edema noted bilaterally Neuro: Cranial nerves II-XII intact, no focal neurological deficits  Disposition:  Home  Diet:  Regular  Activity:  regular   Disposition and Follow-up:  Discharge Instructions    Diet general   Complete by: As directed    Increase activity slowly   Complete by: As directed          TESTS THAT NEED FOLLOW-UP   N/A  DISCHARGE FOLLOW-UP  Follow-up Information    Lucas Mallow, MD.   Specialty: Urology Why: we will call you to set up followup Contact information: [place] [id number] [phone]           Time spent on Discharge:   15 mins  Signed: Lillette Boxer Bradi Arbuthnot 07/25/2019, 4:30 PM

## 2019-07-25 NOTE — Progress Notes (Signed)
CSW received a call from pt's RN stating that pt is to D/C and transport home and was planning to utilize [employer], but RN is asking if transportation of a different sort can be provided.  CSW relayed policy that pt's with the means to provide transport must utillize their chosen option and that the assistance that the CSW could have used if appropriate would have been a similar company like [employer].  RN voiced understanding.  Please reconsult if future social work needs arise.  CSW signing off, as social work intervention is no longer needed.  Alphonse Guild. Lukah Goswami, LCSW, LCAS, CSI Transitions of Care Clinical Social Worker Care Coordination Department Ph: [phone]

## 2019-08-07 DIAGNOSIS — N202 Calculus of kidney with calculus of ureter: Secondary | ICD-10-CM | POA: Diagnosis not present

## 2019-08-12 ENCOUNTER — Other Ambulatory Visit: Payer: Self-pay | Admitting: Urology

## 2019-08-12 DIAGNOSIS — N202 Calculus of kidney with calculus of ureter: Secondary | ICD-10-CM | POA: Diagnosis not present

## 2019-08-12 NOTE — Progress Notes (Signed)
PLEASE PLACE ORDERS IN Epic FOR UPCOMING SURGERY .  THANK YOU !

## 2019-08-13 ENCOUNTER — Encounter (HOSPITAL_COMMUNITY)
Admission: RE | Admit: 2019-08-13 | Discharge: 2019-08-13 | Disposition: A | Discharge status: Home / Self Care | Payer: Medicare Other | Source: Ambulatory Visit | Attending: Urology | Admitting: Urology

## 2019-08-13 ENCOUNTER — Other Ambulatory Visit: Payer: Self-pay

## 2019-08-13 ENCOUNTER — Other Ambulatory Visit (HOSPITAL_COMMUNITY)
Admission: RE | Admit: 2019-08-13 | Discharge: 2019-08-13 | Disposition: A | Discharge status: Home / Self Care | Payer: Medicare Other | Source: Ambulatory Visit | Attending: Urology | Admitting: Urology

## 2019-08-13 DIAGNOSIS — Z20822 Contact with and (suspected) exposure to covid-19: Secondary | ICD-10-CM | POA: Diagnosis not present

## 2019-08-13 DIAGNOSIS — Z01812 Encounter for preprocedural laboratory examination: Secondary | ICD-10-CM | POA: Diagnosis not present

## 2019-08-13 DIAGNOSIS — F4325 Adjustment disorder with mixed disturbance of emotions and conduct: Secondary | ICD-10-CM | POA: Diagnosis not present

## 2019-08-13 LAB — CBC
HCT: 36.9 % (ref 36.0–46.0)
Hemoglobin: 11.8 g/dL — ABNORMAL LOW (ref 12.0–15.0)
MCH: 31.3 pg (ref 26.0–34.0)
MCHC: 32 g/dL (ref 30.0–36.0)
MCV: 97.9 fL (ref 80.0–100.0)
Platelets: 405 10*3/uL — ABNORMAL HIGH (ref 150–400)
RBC: 3.77 MIL/uL — ABNORMAL LOW (ref 3.87–5.11)
RDW: 14.1 % (ref 11.5–15.5)
WBC: 5.1 10*3/uL (ref 4.0–10.5)
nRBC: 0 % (ref 0.0–0.2)

## 2019-08-13 LAB — SARS CORONAVIRUS 2 (TAT 6-24 HRS): SARS Coronavirus 2: NEGATIVE

## 2019-08-13 NOTE — Patient Instructions (Signed)
DUE TO COVID-19 ONLY ONE VISITOR IS ALLOWED TO COME WITH YOU AND STAY IN THE WAITING ROOM ONLY DURING PRE OP AND PROCEDURE DAY OF SURGERY. THE 1 VISITOR MAY VISIT WITH YOU AFTER SURGERY IN YOUR PRIVATE ROOM DURING VISITING HOURS ONLY!  YOU NEED TO HAVE A COVID 19 TEST ON_1/5______ @__10 :05_____, THIS TEST MUST BE DONE BEFORE SURGERY, COME  [address], [place] , [address].  ([place]) ONCE YOUR COVID TEST IS COMPLETED, PLEASE BEGIN THE QUARANTINE INSTRUCTIONS AS OUTLINED IN YOUR HANDOUT.                Cassidy Olsen    Your procedure is scheduled on: 08/15/19   Report to [facility] Main  Entrance   Report to admitting at   9:15 AM     Call this number if you have problems the morning of surgery [phone]    Remember: Do not eat food or drink liquids after Midnight.   BRUSH YOUR TEETH MORNING OF SURGERY AND RINSE YOUR MOUTH OUT, NO CHEWING GUM CANDY OR MINTS.     Take these medicines the morning of surgery with A SIP OF WATER: Nucynta ER, Gabapentin,Elavil,Claritin,Ditropan. Use your inhalers and bring them with you to the hospital If you use a C-Pap , bring your mask and tubing to the hospital                                 You may not have any metal on your body including hair pins and              piercings  Do not wear jewelry, make-up, lotions, powders or perfumes, deodorant             Do not wear nail polish on your fingernails.  Do not shave  48 hours prior to surgery.               Do not bring valuables to the hospital. [place].  Contacts, dentures or bridgework may not be worn into surgery.      Patients discharged the day of surgery will not be allowed to drive home.  IF YOU ARE HAVING SURGERY AND GOING HOME THE SAME DAY, YOU MUST HAVE AN ADULT TO DRIVE YOU HOME AND BE WITH YOU FOR 24 HOURS.  YOU MAY GO HOME BY TAXI OR UBER OR ORTHERWISE, BUT AN ADULT MUST ACCOMPANY YOU HOME  AND STAY WITH YOU FOR 24 HOURS.  Name and phone number of your driver:  Special Instructions: N/A              Please read over the following fact sheets you were given: _____________________________________________________________________             [facility] - Preparing for Surgery  Before surgery, you can play an important role.   Because skin is not sterile, your skin needs to be as free of germs as possible.   You can reduce the number of germs on your skin by washing with CHG (chlorahexidine gluconate) soap before surgery.   CHG is an antiseptic cleaner which kills germs and bonds with the skin to continue killing germs even after washing. Please DO NOT use if you have an allergy to CHG or antibacterial soaps.   If your skin becomes reddened/irritated  stop using the CHG and inform your nurse when you arrive at Short Stay. Do not shave (including legs and underarms) for at least 48 hours prior to the first CHG shower.    Please follow these instructions carefully:  1.  Shower with CHG Soap the night before surgery and the  morning of Surgery.  2.  If you choose to wash your hair, wash your hair first as usual with your  normal  shampoo.  3.  After you shampoo, rinse your hair and body thoroughly to remove the  shampoo.                                        4.  Use CHG as you would any other liquid soap.  You can apply chg directly  to the skin and wash                       Gently with a scrungie or clean washcloth.  5.  Apply the CHG Soap to your body ONLY FROM THE NECK DOWN.   Do not use on face/ open                           Wound or open sores. Avoid contact with eyes, ears mouth and genitals (private parts).                       Wash face,  Genitals (private parts) with your normal soap.             6.  Wash thoroughly, paying special attention to the area where your surgery  will be performed.  7.  Thoroughly rinse your body with warm water from the neck down.  8.  DO  NOT shower/wash with your normal soap after using and rinsing off  the CHG Soap.             9.  Pat yourself dry with a clean towel.            10.  Wear clean pajamas.            11.  Place clean sheets on your bed the night of your first shower and do not  sleep with pets. Day of Surgery : Do not apply any lotions/deodorants the morning of surgery.  Please wear clean clothes to the hospital/surgery center.  FAILURE TO FOLLOW THESE INSTRUCTIONS MAY RESULT IN THE CANCELLATION OF YOUR SURGERY PATIENT SIGNATURE_________________________________  NURSE SIGNATURE__________________________________  ________________________________________________________________________

## 2019-08-14 ENCOUNTER — Encounter (HOSPITAL_COMMUNITY)
Admission: RE | Admit: 2019-08-14 | Discharge: 2019-08-14 | Disposition: A | Discharge status: Home / Self Care | Payer: Medicare Other | Source: Ambulatory Visit | Attending: Urology | Admitting: Urology

## 2019-08-14 ENCOUNTER — Other Ambulatory Visit: Payer: Self-pay

## 2019-08-14 ENCOUNTER — Ambulatory Visit: Payer: Medicare Other

## 2019-08-14 DIAGNOSIS — Z01812 Encounter for preprocedural laboratory examination: Secondary | ICD-10-CM | POA: Diagnosis not present

## 2019-08-14 DIAGNOSIS — Z20822 Contact with and (suspected) exposure to covid-19: Secondary | ICD-10-CM | POA: Diagnosis not present

## 2019-08-15 ENCOUNTER — Ambulatory Visit (HOSPITAL_COMMUNITY): Payer: Medicare Other | Admitting: Anesthesiology

## 2019-08-15 ENCOUNTER — Ambulatory Visit (HOSPITAL_COMMUNITY)
Admission: RE | Admit: 2019-08-15 | Discharge: 2019-08-15 | Disposition: A | Discharge status: Home / Self Care | Payer: Medicare Other | Attending: Urology | Admitting: Urology

## 2019-08-15 ENCOUNTER — Encounter (HOSPITAL_COMMUNITY)
Admission: RE | Disposition: A | Discharge status: Home / Self Care | Payer: Self-pay | Source: Home / Self Care | Attending: Urology

## 2019-08-15 ENCOUNTER — Encounter (HOSPITAL_COMMUNITY): Payer: Self-pay | Admitting: Urology

## 2019-08-15 ENCOUNTER — Ambulatory Visit (HOSPITAL_COMMUNITY): Payer: Medicare Other

## 2019-08-15 ENCOUNTER — Ambulatory Visit (HOSPITAL_COMMUNITY): Payer: Medicare Other | Admitting: Physician Assistant

## 2019-08-15 DIAGNOSIS — K219 Gastro-esophageal reflux disease without esophagitis: Secondary | ICD-10-CM | POA: Insufficient documentation

## 2019-08-15 DIAGNOSIS — N202 Calculus of kidney with calculus of ureter: Secondary | ICD-10-CM | POA: Insufficient documentation

## 2019-08-15 DIAGNOSIS — J45909 Unspecified asthma, uncomplicated: Secondary | ICD-10-CM | POA: Insufficient documentation

## 2019-08-15 DIAGNOSIS — J449 Chronic obstructive pulmonary disease, unspecified: Secondary | ICD-10-CM | POA: Diagnosis not present

## 2019-08-15 DIAGNOSIS — K589 Irritable bowel syndrome without diarrhea: Secondary | ICD-10-CM | POA: Insufficient documentation

## 2019-08-15 DIAGNOSIS — E7800 Pure hypercholesterolemia, unspecified: Secondary | ICD-10-CM | POA: Diagnosis not present

## 2019-08-15 DIAGNOSIS — Z20828 Contact with and (suspected) exposure to other viral communicable diseases: Secondary | ICD-10-CM | POA: Insufficient documentation

## 2019-08-15 DIAGNOSIS — Z79899 Other long term (current) drug therapy: Secondary | ICD-10-CM | POA: Diagnosis not present

## 2019-08-15 DIAGNOSIS — G8929 Other chronic pain: Secondary | ICD-10-CM | POA: Insufficient documentation

## 2019-08-15 DIAGNOSIS — M549 Dorsalgia, unspecified: Secondary | ICD-10-CM | POA: Insufficient documentation

## 2019-08-15 DIAGNOSIS — G500 Trigeminal neuralgia: Secondary | ICD-10-CM | POA: Insufficient documentation

## 2019-08-15 DIAGNOSIS — M1990 Unspecified osteoarthritis, unspecified site: Secondary | ICD-10-CM | POA: Insufficient documentation

## 2019-08-15 DIAGNOSIS — Z87440 Personal history of urinary (tract) infections: Secondary | ICD-10-CM | POA: Diagnosis not present

## 2019-08-15 DIAGNOSIS — M797 Fibromyalgia: Secondary | ICD-10-CM | POA: Insufficient documentation

## 2019-08-15 DIAGNOSIS — Z7951 Long term (current) use of inhaled steroids: Secondary | ICD-10-CM | POA: Insufficient documentation

## 2019-08-15 HISTORY — PX: CYSTOSCOPY W/ URETERAL STENT REMOVAL: SHX1430

## 2019-08-15 HISTORY — PX: HOLMIUM LASER APPLICATION: SHX5852

## 2019-08-15 HISTORY — PX: CYSTOSCOPY WITH RETROGRADE PYELOGRAM, URETEROSCOPY AND STENT PLACEMENT: SHX5789

## 2019-08-15 SURGERY — CYSTOURETEROSCOPY, WITH RETROGRADE PYELOGRAM AND STENT INSERTION
Anesthesia: General | Laterality: Left

## 2019-08-15 MED ORDER — MIDAZOLAM HCL 5 MG/5ML IJ SOLN
INTRAMUSCULAR | Status: DC | PRN
  Administered 2019-08-15: 2 mg via INTRAVENOUS

## 2019-08-15 MED ORDER — LACTATED RINGERS IV SOLN: INTRAVENOUS | Status: DC

## 2019-08-15 MED ORDER — OXYCODONE HCL 5 MG PO TABS
5.0000 mg | ORAL_TABLET | ORAL | Status: DC | PRN
  Administered 2019-08-15: 5 mg via ORAL

## 2019-08-15 MED ORDER — SCOPOLAMINE 1 MG/3DAYS TD PT72
1.0000 | MEDICATED_PATCH | Freq: Once | TRANSDERMAL | Status: DC
  Administered 2019-08-15: 1.5 mg via TRANSDERMAL
  Filled 2019-08-15: qty 1

## 2019-08-15 MED ORDER — FENTANYL CITRATE (PF) 100 MCG/2ML IJ SOLN: 25.0000 ug | INTRAMUSCULAR | Status: DC | PRN

## 2019-08-15 MED ORDER — ONDANSETRON HCL 4 MG/2ML IJ SOLN
INTRAMUSCULAR | Status: DC | PRN
  Administered 2019-08-15: 4 mg via INTRAVENOUS

## 2019-08-15 MED ORDER — FENTANYL CITRATE (PF) 100 MCG/2ML IJ SOLN
INTRAMUSCULAR | Status: DC | PRN
  Administered 2019-08-15 (×2): 50 ug via INTRAVENOUS

## 2019-08-15 MED ORDER — MEPERIDINE HCL 50 MG/ML IJ SOLN: 6.2500 mg | INTRAMUSCULAR | Status: DC | PRN

## 2019-08-15 MED ORDER — OXYCODONE HCL 5 MG PO TABS
ORAL_TABLET | ORAL | Status: AC
  Filled 2019-08-15: qty 1

## 2019-08-15 MED ORDER — LIDOCAINE HCL (CARDIAC) PF 100 MG/5ML IV SOSY
PREFILLED_SYRINGE | INTRAVENOUS | Status: DC | PRN
  Administered 2019-08-15: 80 mg via INTRAVENOUS

## 2019-08-15 MED ORDER — SULFAMETHOXAZOLE-TRIMETHOPRIM 800-160 MG PO TABS: 1.0000 | ORAL_TABLET | Freq: Two times a day (BID) | ORAL | 0 refills | Status: DC

## 2019-08-15 MED ORDER — OXYCODONE HCL 5 MG PO TABS
ORAL_TABLET | ORAL | Status: AC
  Administered 2019-08-15: 5 mg via ORAL
  Filled 2019-08-15: qty 1

## 2019-08-15 MED ORDER — MIDAZOLAM HCL 2 MG/2ML IJ SOLN
INTRAMUSCULAR | Status: AC
  Filled 2019-08-15: qty 2

## 2019-08-15 MED ORDER — SODIUM CHLORIDE 0.9 % IR SOLN
Status: DC | PRN
  Administered 2019-08-15: 6000 mL via INTRAVESICAL

## 2019-08-15 MED ORDER — DEXAMETHASONE SODIUM PHOSPHATE 4 MG/ML IJ SOLN
INTRAMUSCULAR | Status: DC | PRN
  Administered 2019-08-15: 10 mg via INTRAVENOUS

## 2019-08-15 MED ORDER — SODIUM CHLORIDE 0.9 % IR SOLN
Status: DC | PRN
  Administered 2019-08-15: 1000 mL

## 2019-08-15 MED ORDER — CIPROFLOXACIN IN D5W 400 MG/200ML IV SOLN
400.0000 mg | INTRAVENOUS | Status: AC
  Administered 2019-08-15: 400 mg via INTRAVENOUS
  Filled 2019-08-15: qty 200

## 2019-08-15 MED ORDER — MIDAZOLAM HCL 2 MG/2ML IJ SOLN: 0.5000 mg | Freq: Once | INTRAMUSCULAR | Status: DC | PRN

## 2019-08-15 MED ORDER — PROMETHAZINE HCL 25 MG/ML IJ SOLN: 6.2500 mg | INTRAMUSCULAR | Status: DC | PRN

## 2019-08-15 MED ORDER — PROPOFOL 10 MG/ML IV BOLUS
INTRAVENOUS | Status: DC | PRN
  Administered 2019-08-15: 150 mg via INTRAVENOUS

## 2019-08-15 MED ORDER — FENTANYL CITRATE (PF) 100 MCG/2ML IJ SOLN
INTRAMUSCULAR | Status: AC
  Filled 2019-08-15: qty 2

## 2019-08-15 SURGICAL SUPPLY — 23 items
BAG URO CATCHER STRL LF (MISCELLANEOUS) ×2 IMPLANT
BASKET LASER NITINOL 1.9FR (BASKET) ×2 IMPLANT
BASKET ZERO TIP NITINOL 2.4FR (BASKET) IMPLANT
CATH INTERMIT  6FR 70CM (CATHETERS) IMPLANT
CLOTH BEACON ORANGE TIMEOUT ST (SAFETY) ×2 IMPLANT
COVER SURGICAL LIGHT HANDLE (MISCELLANEOUS) ×2 IMPLANT
COVER WAND RF STERILE (DRAPES) IMPLANT
EXTRACTOR STONE 1.7FRX115CM (UROLOGICAL SUPPLIES) ×2 IMPLANT
FIBER LASER FLEXIVA 365 (UROLOGICAL SUPPLIES) IMPLANT
FIBER LASER TRAC TIP (UROLOGICAL SUPPLIES) ×2 IMPLANT
GLOVE BIOGEL M 8.0 STRL (GLOVE) ×2 IMPLANT
GOWN STRL REUS W/TWL XL LVL3 (GOWN DISPOSABLE) ×2 IMPLANT
GUIDEWIRE ANG ZIPWIRE 038X150 (WIRE) ×2 IMPLANT
GUIDEWIRE STR DUAL SENSOR (WIRE) ×2 IMPLANT
IV NS 1000ML (IV SOLUTION) ×1
IV NS 1000ML BAXH (IV SOLUTION) ×1 IMPLANT
KIT TURNOVER KIT A (KITS) IMPLANT
MANIFOLD NEPTUNE II (INSTRUMENTS) ×2 IMPLANT
PACK CYSTO (CUSTOM PROCEDURE TRAY) ×2 IMPLANT
PENCIL SMOKE EVACUATOR (MISCELLANEOUS) IMPLANT
SHEATH URETERAL 12FRX28CM (UROLOGICAL SUPPLIES) ×2 IMPLANT
TUBING CONNECTING 10 (TUBING) ×2 IMPLANT
TUBING UROLOGY SET (TUBING) ×2 IMPLANT

## 2019-08-15 NOTE — Discharge Instructions (Signed)

## 2019-08-15 NOTE — Anesthesia Preprocedure Evaluation (Addendum)
Anesthesia Evaluation  Patient identified by MRN, date of birth, ID band Patient awake    Reviewed: Allergy & Precautions, NPO status , Patient's Chart, lab work & pertinent test results  History of Anesthesia Complications Negative for: history of anesthetic complications  Airway Mallampati: II  TM Distance: >3 FB Neck ROM: Full    Dental  (+) Caps, Dental Advisory Given   Pulmonary COPD,  COPD inhaler,  08/13/2019 SARS coronavirus NEG   breath sounds clear to auscultation       Cardiovascular negative cardio ROS   Rhythm:Regular Rate:Normal     Neuro/Psych  Headaches, Chronic back pain Trigeminal neuralgia    GI/Hepatic Neg liver ROS, GERD  Medicated and Controlled,  Endo/Other  negative endocrine ROS  Renal/GU negative Renal ROS     Musculoskeletal  (+) Arthritis , Fibromyalgia -  Abdominal   Peds  Hematology negative hematology ROS (+)   Anesthesia Other Findings   Reproductive/Obstetrics                            Anesthesia Physical Anesthesia Plan  ASA: II  Anesthesia Plan: General   Post-op Pain Management:    Induction: Intravenous  PONV Risk Score and Plan: 3 and Scopolamine patch - Pre-op, Dexamethasone and Ondansetron  Airway Management Planned: LMA  Additional Equipment:   Intra-op Plan:   Post-operative Plan:   Informed Consent: I have reviewed the patients History and Physical, chart, labs and discussed the procedure including the risks, benefits and alternatives for the proposed anesthesia with the patient or authorized representative who has indicated his/her understanding and acceptance.     Dental advisory given  Plan Discussed with: CRNA and Surgeon  Anesthesia Plan Comments:         Anesthesia Quick Evaluation

## 2019-08-15 NOTE — H&P (Signed)
H&P  Chief Complaint: Kidney stones  History of Present Illness: Cassidy Olsen is a 59 y.o. year old female  The presenting at this time for ureteroscopic management of a left upper ureteral and left renal stone.  She underwent urgent stenting of a left upper ureteral stone with hydronephrosis and probable UTI on 07/25/2019.  She has been treated with antibiotics and recent evaluation the office revealed clear urine.  The she presents this time for stent removal, ureteroscopy, laser and extraction of left ureteral in renal calculi.  Past Medical History:  Diagnosis Date  . Asthma   . Chronic pain   . Family history of breast cancer   . Family history of prostate cancer   . GERD (gastroesophageal reflux disease)   . High cholesterol   . IBS (irritable bowel syndrome)   . Migraine   . Occipital neuralgia   . Osteoarthritis   . Spinal stenosis   . Trigeminal neuralgia   . Trigeminal neuralgia     Past Surgical History:  Procedure Laterality Date  . ABDOMINAL HYSTERECTOMY    . ANKLE SURGERY Left 05/2009  . APPENDECTOMY    . BACK SURGERY    . 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 Pershing Proud [facility]  . BRAIN SURGERY    . CARPAL TUNNEL RELEASE  2014  . CYSTOSCOPY W/ URETERAL STENT PLACEMENT Left 07/25/2019   Procedure: CYSTOSCOPY WITH RETROGRADE PYELOGRAM/URETERAL STENT PLACEMENT;  Surgeon: Franchot Gallo, MD;  Location: [facility] ORS;  Service: Urology;  Laterality: Left;  . JOINT REPLACEMENT     R knee  . OVARIAN CYST REMOVAL  05/2010  . right knee revision  2016  . ROTATOR CUFF REPAIR Right 02/2019  . SPINE SURGERY    . TUBAL LIGATION      Home Medications:  No medications prior to admission.    Allergies:  Allergies  Allergen Reactions  . Toradol [Ketorolac Tromethamine] Shortness Of Breath, Itching and Other (See Comments)    IM administration ONLY  REDNESS  . Aspirin Other (See Comments)    Stomach upset  . Codeine      Cough meds with codeine-have withdraw symptoms when done  . Ibuprofen     Stomach upset  . Latex     Vaginal irritation  . Penicillins Hives    Did it involve swelling of the face/tongue/throat, SOB, or low BP? No Did it involve sudden or severe rash/hives, skin peeling, or any reaction on the inside of your mouth or nose? Yes Did you need to seek medical attention at a hospital or doctor's office? No When did it last happen?2000 If all above answers are "NO", may proceed with cephalosporin use.     Family History  Problem Relation Age of Onset  . Ulcers Mother 80       peptic ulcer  . Prostate cancer Father 68  . Breast cancer Sister 9       ER+/PR+/Her2- breast cancer  . Breast cancer Paternal Grandmother   . Diabetes Paternal Grandmother   . Cancer Paternal Uncle        unknown cancers    Social History:  reports that she has never smoked. She has never used smokeless tobacco. She reports current alcohol use. She reports that she does not use drugs.  ROS: A complete review of systems was performed.  All systems are negative except for pertinent findings as noted.  Physical Exam:  Vital signs in last 24  hours:   General:  Alert and oriented, No acute distress HEENT: Normocephalic, atraumatic Neck: No JVD or lymphadenopathy Cardiovascular: Regular rate and rhythm Lungs: Clear bilaterally Abdomen: Soft, nontender, nondistended, no abdominal masses Back: No CVA tenderness Extremities: No edema Neurologic: Grossly intact  Laboratory Data:  No results found for this or any previous visit (from the past 24 hour(s)). Recent Results (from the past 240 hour(s))  SARS CORONAVIRUS 2 (TAT 6-24 HRS) Nasopharyngeal Nasopharyngeal Swab     Status: None   Collection Time: 08/13/19 11:48 AM   Specimen: Nasopharyngeal Swab  Result Value Ref Range Status   SARS Coronavirus 2 NEGATIVE NEGATIVE Final    Comment: (NOTE) SARS-CoV-2 target nucleic acids are NOT  DETECTED. The SARS-CoV-2 RNA is generally detectable in upper and lower respiratory specimens during the acute phase of infection. Negative results do not preclude SARS-CoV-2 infection, do not rule out co-infections with other pathogens, and should not be used as the sole basis for treatment or other patient management decisions. Negative results must be combined with clinical observations, patient history, and epidemiological information. The expected result is Negative. Fact Sheet for Patients: SugarRoll.be Fact Sheet for Healthcare Providers: https://www.woods-mathews.com/ This test is not yet approved or cleared by the [place] FDA and  has been authorized for detection and/or diagnosis of SARS-CoV-2 by FDA under an Emergency Use Authorization (EUA). This EUA will remain  in effect (meaning this test can be used) for the duration of the COVID-19 declaration under Section 56 4(b)(1) of the Act, 21 U.S.C. section 360bbb-3(b)(1), unless the authorization is terminated or revoked sooner. Performed at [facility], [place] [address], [place], [place] [address]    Creatinine: No results for input(s): CREATININE in the last 168 hours.  Radiologic Imaging: No results found.  Impression/Assessment:    Left ureteral and renal calculi, status post stenting  Plan:   cystoscopy, left ureteral stone extraction, left ureteroscopy, holmium laser lithotripsy and extraction of calculi, stent replacement  Cassidy Olsen 08/15/2019, 7:23 AM  Cassidy Boxer. Alois Mincer MD

## 2019-08-15 NOTE — Anesthesia Postprocedure Evaluation (Signed)
Anesthesia Post Note  Patient: Cassidy Olsen  Procedure(s) Performed: CYSTOSCOPY WITH RETROGRADE PYELOGRAM, URETEROSCOPY (Left ) HOLMIUM LASER APPLICATION (Left ) CYSTOSCOPY WITH STENT REMOVAL (Left )     Patient location during evaluation: PACU Anesthesia Type: General Level of consciousness: awake and alert, patient cooperative and oriented Pain management: pain level controlled (pain improving) Vital Signs Assessment: post-procedure vital signs reviewed and stable Respiratory status: spontaneous breathing, nonlabored ventilation and respiratory function stable Cardiovascular status: blood pressure returned to baseline and stable Postop Assessment: no apparent nausea or vomiting Anesthetic complications: no    Last Vitals:  Vitals:   08/15/19 1330 08/15/19 1348  BP: 117/83 130/90  Pulse: 84 87  Resp: 19 20  Temp:  36.7 C  SpO2: 93% 97%    Last Pain:  Vitals:   08/15/19 1400  TempSrc:   PainSc: 7                  [name],E. Etter Royall

## 2019-08-15 NOTE — Interval H&P Note (Signed)
History and Physical Interval Note:  08/15/2019 10:58 AM  Cassidy Olsen  has presented today for surgery, with the diagnosis of LEFT RENAL AND URETERAL STONES.  The various methods of treatment have been discussed with the patient and family. After consideration of risks, benefits and other options for treatment, the patient has consented to  Procedure(s) with comments: CYSTOSCOPY WITH RETROGRADE PYELOGRAM, URETEROSCOPY AND STENT PLACEMENT (Left) - 90 MINS HOLMIUM LASER APPLICATION (Left) CYSTOSCOPY WITH STENT REMOVAL (Left) as a surgical intervention.  The patient's history has been reviewed, patient examined, no change in status, stable for surgery.  I have reviewed the patient's chart and labs.  Questions were answered to the patient's satisfaction.     Lillette Boxer Kegan Shepardson

## 2019-08-15 NOTE — Op Note (Signed)
Preoperative diagnosis: Left upper ureteral and renal calculi  Postoperative diagnosis: Same  Principal procedure: Cystoscopy, left double-J stent extraction, left ureteroscopy, holmium laser lithotripsy and extraction of left ureteral and renal calculi  Surgeon: Analucia Hush  Anesthesia: General with LMA  Complications: None  Specimen: Stone fragments  Estimated blood loss: None  Indications: 59 year old female status post recent urgent stenting for an infection with an obstructing left upper ureteral stone.  In addition, she has a left renal stone.  Stent has been in place about 3 weeks, she has been treated appropriately for urinary tract infection with recent follow-up urinalysis clear.  She presents for endoscopic management of these stones.  I discussed the procedure with the patient as well as risks complications and the need, possibly, for repeat stent.  She understands the procedure as well as the risks and complications and desires to proceed.  Findings: Urothelium of the bladder was normal.  Ureteral orifice ease were normal.  Stent positioned appropriately at the left ureteral orifice.  Ureteroscopic examination of the ureter revealed the solitary upper ureteral stone.  Pyelocalyceal system was normal with 1 tiny stone in the lower pole calyx, larger stone in the upper pole calyx.  Description of procedure: The patient was properly identified and marked in the holding area.  She received preoperative IV antibiotics.  General anesthetic induced with the LMA.  She was placed in the dorsolithotomy position.  Genitalia and perineum were prepped and draped.  Proper timeout was performed.  30 [place] scope was advanced into the bladder with systematic inspection performed and the left ureteral stent was grasped and brought out through the urethral meatus.  It was cannulated with a sensor tip guidewire which was advanced in the upper pole calyx.  Stent was extracted over top of the guidewire.   I then passed the semirigid scope up to the previously mentioned stone now located in the proximal left ureter.  The ureter had been dilated with the ureteral access catheter.  However, the stone was too big to be extracted.  It was then fragmented with the 200 m fiber using holmium laser energy set at 30 Hz and 0.5 J.  Multiple smaller fragments resulted in these were grasped and brought down without difficulty into the bladder.  This took care of the ureteral stone.  I then remove the ureteroscope, and over top of the guidewire reinserted the ureteral access catheter.  The flexible scope was advanced into the pyelocalyceal system.  The larger stone in the upper pole was grasped and it could actually be brought through the ureteral access catheter.  Additionally, the very small stone located in the lower pole calyx was extracted.  Systematic inspection of the renal pelvis and all the other calyces revealed no further stone burden.  At this point the scope was removed and the sensor tip guidewire replaced.  I then remove the access catheter.  The small fragments placed in the bladder were irrigated out with the cystoscope.  No further stones were seen.  The patient had significant pain from the stent, and there was minimal ureteral trauma from this procedure and I decided not to leave a stent in.  The scope was removed after the bladder was drained.  The procedure was then terminated.  The patient was awakened, taken to the PACU in stable condition.  She tolerated the procedure well.

## 2019-08-15 NOTE — Anesthesia Procedure Notes (Signed)
Procedure Name: LMA Insertion Date/Time: 08/15/2019 12:01 PM Performed by: Lavina Hamman, CRNA Pre-anesthesia Checklist: Patient identified, Emergency Drugs available, Suction available and Patient being monitored Patient Re-evaluated:Patient Re-evaluated prior to induction Oxygen Delivery Method: Circle System Utilized Preoxygenation: Pre-oxygenation with 100% oxygen Induction Type: IV induction Ventilation: Mask ventilation without difficulty LMA: LMA inserted LMA Size: 4.0 Number of attempts: 1 Airway Equipment and Method: Bite block Placement Confirmation: positive ETCO2 Tube secured with: Tape Dental Injury: Teeth and Oropharynx as per pre-operative assessment

## 2019-08-15 NOTE — Transfer of Care (Signed)
Immediate Anesthesia Transfer of Care Note  Patient: Cassidy Olsen  Procedure(s) Performed: Procedure(s) with comments: CYSTOSCOPY WITH RETROGRADE PYELOGRAM, URETEROSCOPY (Left) - 90 MINS HOLMIUM LASER APPLICATION (Left) CYSTOSCOPY WITH STENT REMOVAL (Left)  Patient Location: PACU  Anesthesia Type:General  Level of Consciousness:  sedated, patient cooperative and responds to stimulation  Airway & Oxygen Therapy:Patient Spontanous Breathing and Patient connected to face mask oxgen  Post-op Assessment:  Report given to PACU RN and Post -op Vital signs reviewed and stable  Post vital signs:  Reviewed and stable  Last Vitals:  Vitals:   08/15/19 0934 08/15/19 1240  BP: 112/79 (!) (P) 133/93  Pulse: 99   Resp: 16   Temp: 37.5 C (!) (P) 36.1 C  SpO2: [id number]     Complications: No apparent anesthesia complications

## 2019-08-16 ENCOUNTER — Encounter: Payer: Self-pay | Admitting: *Deleted

## 2019-08-21 ENCOUNTER — Ambulatory Visit: Payer: Medicare Other | Admitting: Family Medicine

## 2019-08-22 DIAGNOSIS — N202 Calculus of kidney with calculus of ureter: Secondary | ICD-10-CM | POA: Diagnosis not present

## 2019-08-26 DIAGNOSIS — M25512 Pain in left shoulder: Secondary | ICD-10-CM | POA: Diagnosis not present

## 2019-08-26 DIAGNOSIS — M6281 Muscle weakness (generalized): Secondary | ICD-10-CM | POA: Diagnosis not present

## 2019-08-26 DIAGNOSIS — M47816 Spondylosis without myelopathy or radiculopathy, lumbar region: Secondary | ICD-10-CM | POA: Diagnosis not present

## 2019-08-26 DIAGNOSIS — M7502 Adhesive capsulitis of left shoulder: Secondary | ICD-10-CM | POA: Diagnosis not present

## 2019-08-27 DIAGNOSIS — M25511 Pain in right shoulder: Secondary | ICD-10-CM | POA: Diagnosis not present

## 2019-08-28 ENCOUNTER — Other Ambulatory Visit: Payer: Self-pay | Admitting: Family Medicine

## 2019-08-28 DIAGNOSIS — M6281 Muscle weakness (generalized): Secondary | ICD-10-CM | POA: Diagnosis not present

## 2019-08-28 DIAGNOSIS — M25512 Pain in left shoulder: Secondary | ICD-10-CM | POA: Diagnosis not present

## 2019-08-28 DIAGNOSIS — M7502 Adhesive capsulitis of left shoulder: Secondary | ICD-10-CM | POA: Diagnosis not present

## 2019-08-28 DIAGNOSIS — E7800 Pure hypercholesterolemia, unspecified: Secondary | ICD-10-CM

## 2019-08-28 NOTE — Telephone Encounter (Signed)
Requested medication (s) are due for refill today: no  Requested medication (s) are on the active medication list:yes  Last refill:  06/06/2019  Future visit scheduled: yes  Notes to clinic:  Last ordered by historical provider  Review for refill   Requested Prescriptions  Pending Prescriptions Disp Refills   fenofibrate (TRICOR) 145 MG tablet [Pharmacy Med Name: FENOFIBRATE 145 MG TABLET] 90 tablet 0    Sig: TAKE 1 TABLET BY MOUTH EVERY DAY      Cardiovascular:  Antilipid - Fibric Acid Derivatives Failed - 08/28/2019  1:48 AM      Failed - Total Cholesterol in normal range and within 360 days    No results found for: CHOL, POCCHOL, CHOLTOT        Failed - LDL in normal range and within 360 days    No results found for: LDLCALC, LDLC, HIRISKLDL, POCLDL, LDLDIRECT, REALLDLC, TOTLDLC        Failed - HDL in normal range and within 360 days    No results found for: HDL, POCHDL        Failed - Triglycerides in normal range and within 360 days    No results found for: TRIG, POCTRIG        Failed - Cr in normal range and within 180 days    Creatinine, Ser  Date Value Ref Range Status  07/24/2019 1.98 (H) 0.44 - 1.00 mg/dL Final          Failed - eGFR in normal range and within 180 days    GFR calc Af Amer  Date Value Ref Range Status  07/24/2019 32 (L) >60 mL/min Final   GFR calc non Af Amer  Date Value Ref Range Status  07/24/2019 27 (L) >60 mL/min Final          Passed - ALT in normal range and within 180 days    ALT  Date Value Ref Range Status  07/24/2019 13 0 - 44 U/L Final          Passed - AST in normal range and within 180 days    AST  Date Value Ref Range Status  07/24/2019 17 15 - 41 U/L Final          Passed - Valid encounter within last 12 months    Recent Outpatient Visits           1 month ago Flank pain, acute   [facility], Lilia Argue, MD   1 month ago Degenerative disc disease, lumbar   Primary Care at [facility], Arlie Solomons, MD   3 months ago Gallstones   Primary Care at [facility], Arlie Solomons, MD       Future Appointments             In 6 days Forrest Moron, MD Primary Care at [place], [facility]

## 2019-08-29 ENCOUNTER — Other Ambulatory Visit: Payer: Self-pay | Admitting: Family Medicine

## 2019-08-29 DIAGNOSIS — E7800 Pure hypercholesterolemia, unspecified: Secondary | ICD-10-CM

## 2019-08-29 NOTE — Telephone Encounter (Signed)
Requested medication (s) are due for refill today: no  Requested medication (s) are on the active medication list: yes  Last refill: 08/28/19  Future visit scheduled:yes 6 days  Notes to clinic:  patient over due for labs. Appointment in 5 days.  Pharmacy request for 90 days    Requested Prescriptions  Pending Prescriptions Disp Refills   fenofibrate (TRICOR) 145 MG tablet [Pharmacy Med Name: FENOFIBRATE 145 MG TABLET] 90 tablet 1    Sig: TAKE 1 TABLET BY MOUTH EVERY DAY      Cardiovascular:  Antilipid - Fibric Acid Derivatives Failed - 08/29/2019  1:43 PM      Failed - Total Cholesterol in normal range and within 360 days    No results found for: CHOL, POCCHOL, CHOLTOT        Failed - LDL in normal range and within 360 days    No results found for: LDLCALC, LDLC, HIRISKLDL, POCLDL, LDLDIRECT, REALLDLC, TOTLDLC        Failed - HDL in normal range and within 360 days    No results found for: HDL, POCHDL        Failed - Triglycerides in normal range and within 360 days    No results found for: TRIG, POCTRIG        Failed - Cr in normal range and within 180 days    Creatinine, Ser  Date Value Ref Range Status  07/24/2019 1.98 (H) 0.44 - 1.00 mg/dL Final          Failed - eGFR in normal range and within 180 days    GFR calc Af Amer  Date Value Ref Range Status  07/24/2019 32 (L) >60 mL/min Final   GFR calc non Af Amer  Date Value Ref Range Status  07/24/2019 27 (L) >60 mL/min Final          Passed - ALT in normal range and within 180 days    ALT  Date Value Ref Range Status  07/24/2019 13 0 - 44 U/L Final          Passed - AST in normal range and within 180 days    AST  Date Value Ref Range Status  07/24/2019 17 15 - 41 U/L Final          Passed - Valid encounter within last 12 months    Recent Outpatient Visits           1 month ago Flank pain, acute   [facility], Lilia Argue, MD   1 month ago Degenerative disc disease, lumbar   Primary Care at [facility], Arlie Solomons, MD   3 months ago Gallstones   Primary Care at [facility], Arlie Solomons, MD       Future Appointments             In 5 days Forrest Moron, MD Primary Care at [place], [facility]

## 2019-08-30 ENCOUNTER — Other Ambulatory Visit: Payer: Self-pay | Admitting: Pain Medicine

## 2019-08-30 DIAGNOSIS — M542 Cervicalgia: Secondary | ICD-10-CM

## 2019-09-02 DIAGNOSIS — M25669 Stiffness of unspecified knee, not elsewhere classified: Secondary | ICD-10-CM | POA: Insufficient documentation

## 2019-09-02 DIAGNOSIS — Q619 Cystic kidney disease, unspecified: Secondary | ICD-10-CM | POA: Insufficient documentation

## 2019-09-02 DIAGNOSIS — K581 Irritable bowel syndrome with constipation: Secondary | ICD-10-CM | POA: Insufficient documentation

## 2019-09-02 DIAGNOSIS — K5900 Constipation, unspecified: Secondary | ICD-10-CM | POA: Insufficient documentation

## 2019-09-03 ENCOUNTER — Ambulatory Visit (INDEPENDENT_AMBULATORY_CARE_PROVIDER_SITE_OTHER): Payer: Medicare Other | Admitting: Family Medicine

## 2019-09-03 ENCOUNTER — Other Ambulatory Visit: Payer: Self-pay

## 2019-09-03 ENCOUNTER — Encounter: Payer: Self-pay | Admitting: Family Medicine

## 2019-09-03 ENCOUNTER — Telehealth: Payer: Self-pay

## 2019-09-03 VITALS — BP 125/87 | HR 99 | Temp 97.7°F | Ht 63.0 in | Wt 160.0 lb

## 2019-09-03 DIAGNOSIS — N289 Disorder of kidney and ureter, unspecified: Secondary | ICD-10-CM

## 2019-09-03 DIAGNOSIS — K581 Irritable bowel syndrome with constipation: Secondary | ICD-10-CM | POA: Diagnosis not present

## 2019-09-03 DIAGNOSIS — N200 Calculus of kidney: Secondary | ICD-10-CM | POA: Diagnosis not present

## 2019-09-03 NOTE — Patient Instructions (Signed)
° ° ° °  If you have lab work done today you will be contacted with your lab results within the next 2 weeks.  If you have not heard from us then please contact us. The fastest way to get your results is to register for My Chart. ° ° °IF you received an x-ray today, you will receive an invoice from [facility]. Please contact  Radiology at [phone] with questions or concerns regarding your invoice.  ° °IF you received labwork today, you will receive an invoice from [facility]. Please contact [facility] at [phone] with questions or concerns regarding your invoice.  ° °Our billing staff will not be able to assist you with questions regarding bills from these companies. ° °You will be contacted with the lab results as soon as they are available. The fastest way to get your results is to activate your My Chart account. Instructions are located on the last page of this paperwork. If you have not heard from us regarding the results in 2 weeks, please contact this office. °  ° ° ° °

## 2019-09-03 NOTE — Telephone Encounter (Signed)
Pt is requesting brand exemption for her amitiza due to cost, the pharmacist will send fax of denial or approval. Their system is currently down

## 2019-09-03 NOTE — Progress Notes (Signed)
Established Patient Office Visit  Subjective:  Patient ID: Cassidy Olsen, female    DOB: [birth year]  Age: 59 y.o. MRN: [id number]  CC:  Chief Complaint  Patient presents with  . Follow-up    pt wants to make sure labs are being drawn to monitor the meds she is on, and info on proper diet due to kidney stones. States that gastro fired her, she is asking for a referral for another  . medication    a form will be faxed for doctor sig rgarding amitiza for brand exemption    HPI Cassidy Olsen presents for   Chronic Pain Patient is here today to follow up from Orthopedic Surgery She states that the SI joint is pressing on her Sciatic nerve in different areas so she was advised to get in with Pain Mgmt. She states that she is choosing to continue with Pain Management.  She had pain treatment last week with facet joint injections because the pain was very severe after the injection.  Kidney Stones She had kidney stones and is s/p surgery and she is feeling much better.  She is working on her diet.  She has a 54m stone on her right kidney. She will follow up in 5 weeks for an ultrasound of the kidney. She states that she would like to see the   Chronic IBS-C She reports that she was on Movantik which did not work for her after 2 months. She states that Dr. MCollene Marestold her that she would like her to get a specialist somewhere else.  She does not have a gastroenterologist.  Dr. [place]ira [place]  She is concerned about the cost being $60.00 She would like to know if she can get brand exception for her medication.  She cannot have normal BM given her IBS as well as her opiate and .  Linzess that did not work did not work after 2 months.      Past Medical History:  Diagnosis Date  . Asthma   . Chronic pain   . Family history of breast cancer   . Family history of prostate cancer   . GERD (gastroesophageal reflux disease)   . High cholesterol   . IBS (irritable  bowel syndrome)   . Migraine   . Occipital neuralgia   . Osteoarthritis   . Spinal stenosis   . Trigeminal neuralgia   . Trigeminal neuralgia     Past Surgical History:  Procedure Laterality Date  . ABDOMINAL HYSTERECTOMY    . ANKLE SURGERY Left 05/2009  . APPENDECTOMY    . BACK SURGERY    . 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    . CARPAL TUNNEL RELEASE  2014  . CYSTOSCOPY W/ URETERAL STENT PLACEMENT Left 07/25/2019   Procedure: CYSTOSCOPY WITH RETROGRADE PYELOGRAM/URETERAL STENT PLACEMENT;  Surgeon: DFranchot Gallo MD;  Location: [facility];  Service: Urology;  Laterality: Left;  . CYSTOSCOPY W/ URETERAL STENT REMOVAL Left 08/15/2019   Procedure: CYSTOSCOPY WITH STENT REMOVAL;  Surgeon: DFranchot Gallo MD;  Location: [facility];  Service: Urology;  Laterality: Left;  . CYSTOSCOPY WITH RETROGRADE PYELOGRAM, URETEROSCOPY AND STENT PLACEMENT Left 08/15/2019   Procedure: CYSTOSCOPY WITH RETROGRADE PYELOGRAM, URETEROSCOPY;  Surgeon: DFranchot Gallo MD;  Location: [facility];  Service: Urology;  Laterality: Left;  90 MINS  . HOLMIUM LASER APPLICATION Left 11/0/0712  Procedure: HOLMIUM LASER APPLICATION;  Surgeon: Franchot Gallo, MD;  Location: [facility];  Service: Urology;  Laterality: Left;  . JOINT REPLACEMENT     R knee  . OVARIAN CYST REMOVAL  05/2010  . right knee revision  2016  . ROTATOR CUFF REPAIR Right 02/2019  . SPINE SURGERY    . TUBAL LIGATION      Family History  Problem Relation Age of Onset  . Ulcers Mother 60       peptic ulcer  . Prostate cancer Father 3  . Breast cancer Sister 74       ER+/PR+/Her2- breast cancer  . Breast cancer Paternal Grandmother   . Diabetes Paternal Grandmother   . Cancer Paternal Uncle        unknown cancers    Social History   Socioeconomic History  . Marital status: Single    Spouse name: Not on file  . Number of children: 1  . Years of  education: Not on file  . Highest education level: Not on file  Occupational History  . Not on file  Tobacco Use  . Smoking status: Never Smoker  . Smokeless tobacco: Never Used  Substance and Sexual Activity  . Alcohol use: Yes    Comment: socially  . Drug use: No  . Sexual activity: Not on file  Other Topics Concern  . Not on file  Social History Narrative   Lives at home alone   Right handed   Caffeine: 1 or 2 cups a week   Social Determinants of Health   Financial Resource Strain:   . Difficulty of Paying Living Expenses: Not on file  Food Insecurity:   . Worried About Charity fundraiser in the Last Year: Not on file  . Ran Out of Food in the Last Year: Not on file  Transportation Needs:   . Lack of Transportation (Medical): Not on file  . Lack of Transportation (Non-Medical): Not on file  Physical Activity:   . Days of Exercise per Week: Not on file  . Minutes of Exercise per Session: Not on file  Stress:   . Feeling of Stress : Not on file  Social Connections:   . Frequency of Communication with Friends and Family: Not on file  . Frequency of Social Gatherings with Friends and Family: Not on file  . Attends Religious Services: Not on file  . Active Member of Clubs or Organizations: Not on file  . Attends Archivist Meetings: Not on file  . Marital Status: Not on file  Intimate Partner Violence:   . Fear of Current or Ex-Partner: Not on file  . Emotionally Abused: Not on file  . Physically Abused: Not on file  . Sexually Abused: Not on file    Outpatient Medications Prior to Visit  Medication Sig Dispense Refill  . acetaminophen (TYLENOL) 325 MG tablet Take 650 mg by mouth daily.    Marland Kitchen albuterol (VENTOLIN HFA) 108 (90 Base) MCG/ACT inhaler Inhale 2 puffs into the lungs every 6 (six) hours as needed for wheezing or shortness of breath. 18 g 11  . AMITIZA 24 MCG capsule Take 24 mcg by mouth 2 (two) times daily.    Marland Kitchen amitriptyline (ELAVIL) 50 MG tablet  Take 1 tablet (50 mg total) by mouth at bedtime. 90 tablet 3  . Ascorbic Acid (VITAMIN C) 1000 MG tablet Take 1,000 mg by mouth daily.    . budesonide-formoterol (SYMBICORT) 80-4.5 MCG/ACT inhaler Inhale 2 puffs into the lungs every 12 (twelve) hours. (  Patient taking differently: Inhale 2 puffs into the lungs 2 (two) times daily as needed (respiratory issues.). ) 1 Inhaler 5  . Calcium Carb-Cholecalciferol (CALCIUM + D3 PO) Take 1 tablet by mouth daily.    . celecoxib (CELEBREX) 200 MG capsule Take 200 mg by mouth daily.     . cholecalciferol (VITAMIN D) 25 MCG (1000 UT) tablet Take 1,000 Units by mouth daily.    . cyanocobalamin 1000 MCG tablet Take 1,000 mcg by mouth daily.    Marland Kitchen dexlansoprazole (DEXILANT) 60 MG capsule Take 60 mg by mouth daily.    Marland Kitchen ELDERBERRY PO Take 5 mLs by mouth daily.     . fenofibrate (TRICOR) 145 MG tablet TAKE 1 TABLET BY MOUTH EVERY DAY 30 tablet 0  . fluticasone (FLONASE) 50 MCG/ACT nasal spray Place 2 sprays into both nostrils daily. (Patient taking differently: Place 2 sprays into both nostrils 2 (two) times daily. ) 16 g 11  . gabapentin (NEURONTIN) 300 MG capsule Take 1-2 capsules (300-600 mg total) by mouth 3 (three) times daily. (Patient taking differently: Take 600 mg by mouth 3 (three) times daily. ) 180 capsule 11  . loratadine (CLARITIN) 10 MG tablet Take 10 mg by mouth at bedtime.     . Melatonin 10 MG TABS Take 10 mg by mouth at bedtime.     . methocarbamol (ROBAXIN) 500 MG tablet Take 500 mg by mouth at bedtime.     . montelukast (SINGULAIR) 10 MG tablet Take 1 tablet (10 mg total) by mouth at bedtime. 30 tablet 11  . OIL OF OREGANO PO Take 1 capsule by mouth daily.     . Probiotic Product (UP4 PROBIOTICS ULTRA) CAPS Take 1 capsule by mouth daily.    . Simethicone 180 MG CAPS Take 1 capsule (180 mg total) by mouth 2 (two) times daily. (Patient taking differently: Take 180 mg by mouth 2 (two) times daily as needed (gas). ) 30 capsule 0  .  Spacer/Aero-Holding Chambers (AEROCHAMBER MV) inhaler Use as instructed 1 each 0  . tapentadol (NUCYNTA ER) 100 MG 12 hr tablet Take 100 mg by mouth every 12 (twelve) hours.    . topiramate (TOPAMAX) 100 MG tablet Take 1 tablet (100 mg total) by mouth 2 (two) times daily. 180 tablet 3  . valACYclovir (VALTREX) 500 MG tablet Take 500 mg by mouth 2 (two) times daily as needed (outbreaks).     . zinc gluconate 50 MG tablet Take 50 mg by mouth daily.    Marland Kitchen oxybutynin (DITROPAN) 5 MG tablet Take 1 tablet (5 mg total) by mouth every 8 (eight) hours as needed for bladder spasms. (Patient taking differently: Take 5 mg by mouth 3 (three) times daily. ) 45 tablet 1  . sulfamethoxazole-trimethoprim (BACTRIM DS) 800-160 MG tablet Take 1 tablet by mouth 2 (two) times daily. 6 tablet 0   No facility-administered medications prior to visit.    Allergies  Allergen Reactions  . Toradol [Ketorolac Tromethamine] Shortness Of Breath, Itching and Other (See Comments)    IM administration ONLY  REDNESS  . Aspirin Other (See Comments)    Stomach upset  . Codeine     Cough meds with codeine-have withdraw symptoms when done  . Ibuprofen     Stomach upset  . Latex     Vaginal irritation  . Penicillins Hives    Did it involve swelling of the face/tongue/throat, SOB, or low BP? No Did it involve sudden or severe rash/hives, skin peeling, or any reaction  on the inside of your mouth or nose? Yes Did you need to seek medical attention at a hospital or doctor's office? No When did it last happen?2000 If all above answers are "NO", may proceed with cephalosporin use.     ROS Review of Systems Review of Systems    See hpi    Objective:    Physical Exam  BP 125/87   Pulse 99   Temp 97.7 F (36.5 C)   Ht '5\' 3"'  (1.6 m)   Wt 160 lb (72.6 kg)   SpO2 100%   BMI 28.34 kg/m  Wt Readings from Last 3 Encounters:  09/03/19 160 lb (72.6 kg)  08/15/19 168 lb 9.6 oz (76.5 kg)  08/13/19 168 lb 9.6 oz  (76.5 kg)   Physical Exam  Constitutional: Oriented to person, place, and time. Appears well-developed and well-nourished.  HENT:  Head: Normocephalic and atraumatic.  Eyes: Conjunctivae and EOM are normal.  Cardiovascular: Normal rate, regular rhythm, normal heart sounds and intact distal pulses.  No murmur heard. Pulmonary/Chest: Effort normal and breath sounds normal. No stridor. No respiratory distress. Has no wheezes.  Neurological: Is alert and oriented to person, place, and time.  Skin: Skin is warm. Capillary refill takes less than 2 seconds.  Psychiatric: Has a normal mood and affect. Behavior is normal. Judgment and thought content normal.    Health Maintenance Due  Topic Date Due  . Hepatitis C Screening  Dec 06, 1960  . PAP SMEAR-Modifier  12/17/1982  . COLONOSCOPY  12/17/2011    There are no preventive care reminders to display for this patient.  No results found for: TSH Lab Results  Component Value Date   WBC 5.1 08/13/2019   HGB 11.8 (L) 08/13/2019   HCT 36.9 08/13/2019   MCV 97.9 08/13/2019   PLT 405 (H) 08/13/2019   Lab Results  Component Value Date   NA 138 07/24/2019   K 4.0 07/24/2019   CO2 23 07/24/2019   GLUCOSE 102 (H) 07/24/2019   BUN 24 (H) 07/24/2019   CREATININE 1.98 (H) 07/24/2019   BILITOT 0.6 07/24/2019   ALKPHOS 51 07/24/2019   AST 17 07/24/2019   ALT 13 07/24/2019   PROT 8.4 (H) 07/24/2019   ALBUMIN 3.9 07/24/2019   CALCIUM 9.9 07/24/2019   ANIONGAP 11 07/24/2019   No results found for: CHOL No results found for: HDL No results found for: LDLCALC No results found for: TRIG No results found for: CHOLHDL No results found for: HGBA1C     Cassidy Olsen was seen today for follow-up and medication.  Diagnoses and all orders for this visit:  Acute renal insufficiency - discussed monitoring renal function  -     CMP14+EGFR  Bilateral renal stones-  Discussed her experience She should follow up with Nephrology  Irritable bowel  syndrome with constipation-  Since pt is able to do better on Amitiza but can't afford her copay will see if prior authorization will help She may want to contact the manufacturer This medication is medically necessary   A total of 32 minutes were spent face-to-face with the patient during this encounter and over half of that time was spent on counseling and coordination of care.    No orders of the defined types were placed in this encounter.   Follow-up: No follow-ups on file.    Forrest Moron, MD

## 2019-09-04 ENCOUNTER — Telehealth: Payer: Self-pay | Admitting: Family Medicine

## 2019-09-04 DIAGNOSIS — M6281 Muscle weakness (generalized): Secondary | ICD-10-CM | POA: Diagnosis not present

## 2019-09-04 DIAGNOSIS — M25512 Pain in left shoulder: Secondary | ICD-10-CM | POA: Diagnosis not present

## 2019-09-04 DIAGNOSIS — M7502 Adhesive capsulitis of left shoulder: Secondary | ICD-10-CM | POA: Diagnosis not present

## 2019-09-04 NOTE — Telephone Encounter (Signed)
Regarding update on the price adjustment from telephone encounter 09/03/2019  She also has some results from her kidney doctor  Pt had spoken to Annabelle Harman, [place] about the concern and was trying to relay message to her  Please advise

## 2019-09-05 NOTE — Telephone Encounter (Signed)
Please Advise

## 2019-09-09 ENCOUNTER — Telehealth: Payer: Self-pay | Admitting: Family Medicine

## 2019-09-09 DIAGNOSIS — R1084 Generalized abdominal pain: Secondary | ICD-10-CM | POA: Diagnosis not present

## 2019-09-09 DIAGNOSIS — N202 Calculus of kidney with calculus of ureter: Secondary | ICD-10-CM | POA: Diagnosis not present

## 2019-09-09 NOTE — Telephone Encounter (Signed)
Dr Nolon Rod, Patient wanted to let you know that she could not get her labs done due to she was dehydrated and want to know if you still want her to come in to get these labs done. Patient also wanted to let you know that one of her stones did drop and it was calcium oxide. She just left her urologist or her neurologist and the will give her a call to schedule an appt to get the other stone removed.

## 2019-09-09 NOTE — Telephone Encounter (Signed)
Forms has already received and faxed back at 4:38pm. Awaiting on feed back from the company

## 2019-09-09 NOTE — Telephone Encounter (Signed)
Pt was informed that Dr. Nolon Rod would sign the forms from her insurance company. Pt states the insurance company is allowing for office to reply until 09/14/19

## 2019-09-09 NOTE — Telephone Encounter (Signed)
Pt called to inform pcp that her insurance company has faxed over forms that need to be filled by 2/6. She says information can be faxed back or called in

## 2019-09-10 ENCOUNTER — Other Ambulatory Visit (HOSPITAL_COMMUNITY)
Admission: RE | Admit: 2019-09-10 | Discharge: 2019-09-10 | Disposition: A | Discharge status: Home / Self Care | Payer: Medicare Other | Source: Ambulatory Visit | Attending: Urology | Admitting: Urology

## 2019-09-10 ENCOUNTER — Other Ambulatory Visit: Payer: Self-pay | Admitting: Urology

## 2019-09-10 DIAGNOSIS — Z20822 Contact with and (suspected) exposure to covid-19: Secondary | ICD-10-CM | POA: Insufficient documentation

## 2019-09-10 DIAGNOSIS — Z01812 Encounter for preprocedural laboratory examination: Secondary | ICD-10-CM | POA: Insufficient documentation

## 2019-09-10 LAB — SARS CORONAVIRUS 2 (TAT 6-24 HRS): SARS Coronavirus 2: NEGATIVE

## 2019-09-10 NOTE — Patient Instructions (Addendum)
DUE TO COVID-19 ONLY ONE VISITOR IS ALLOWED TO COME WITH YOU AND STAY IN THE WAITING ROOM ONLY DURING PRE OP AND PROCEDURE DAY OF SURGERY. THE 1 VISITOR MAY VISIT WITH YOU AFTER SURGERY IN YOUR PRIVATE ROOM DURING VISITING HOURS ONLY!  YOUR COVID TEST IS COMPLETED, PLEASE BEGIN THE QUARANTINE INSTRUCTIONS AS OUTLINED IN YOUR HANDOUT.                Cassidy Olsen   Your procedure is scheduled on: 09/12/2019   Report to [facility] Main  Entrance    Report to Admitting at 6:15AM     Call this number if you have problems the morning of surgery [phone]    Remember: Do not eat food or drink liquids :After Midnight.     Take these medicines the morning of surgery with A SIP OF WATER: DEXILANT, FENOFIBRATE, TOPIRAMATE, GABAPENTIN, VENTOLIN INHALER IF NEEDED, SYMBICORT INHALE RIF NEEDED  BRUSH YOUR TEETH MORNING OF SURGERY AND RINSE YOUR MOUTH OUT, NO CHEWING GUM CANDY OR MINTS.                                 You may not have any metal on your body including hair pins and              piercings  Do not wear jewelry, make-up, lotions, powders or perfumes, deodorant             Do not wear nail polish on your fingernails.  Do not shave  48 hours prior to surgery.     Do not bring valuables to the hospital. [place].  Contacts, dentures or bridgework may not be worn into surgery.      Patients discharged the day of surgery will not be allowed to drive home. IF YOU ARE HAVING SURGERY AND GOING HOME THE SAME DAY, YOU MUST HAVE AN ADULT TO DRIVE YOU HOME AND BE WITH YOU FOR 24 HOURS. YOU MAY GO HOME BY TAXI OR UBER OR ORTHERWISE, BUT AN ADULT MUST ACCOMPANY YOU HOME AND STAY WITH YOU FOR 24 HOURS.  Name and phone number of your driver:Linda Marletta Lor [phone]  Special Instructions: N/A              Please read over the following fact sheets you were  given: _____________________________________________________________________             [facility] - Preparing for Surgery Before surgery, you can play an important role.  Because skin is not sterile, your skin needs to be as free of germs as possible.  You can reduce the number of germs on your skin by washing with CHG (chlorahexidine gluconate) soap before surgery.  CHG is an antiseptic cleaner which kills germs and bonds with the skin to continue killing germs even after washing. Please DO NOT use if you have an allergy to CHG or antibacterial soaps.  If your skin becomes reddened/irritated stop using the CHG and inform your nurse when you arrive at Short Stay. Do not shave (including legs and underarms) for at least 48 hours prior to the first CHG shower.  You may shave your face/neck. Please follow these instructions carefully:  1.  Shower with CHG Soap the night before surgery and the  morning of Surgery.  2.  If you choose to  wash your hair, wash your hair first as usual with your  normal  shampoo.  3.  After you shampoo, rinse your hair and body thoroughly to remove the  shampoo.                           4.  Use CHG as you would any other liquid soap.  You can apply chg directly  to the skin and wash                       Gently with a scrungie or clean washcloth.  5.  Apply the CHG Soap to your body ONLY FROM THE NECK DOWN.   Do not use on face/ open                           Wound or open sores. Avoid contact with eyes, ears mouth and genitals (private parts).                       Wash face,  Genitals (private parts) with your normal soap.             6.  Wash thoroughly, paying special attention to the area where your surgery  will be performed.  7.  Thoroughly rinse your body with warm water from the neck down.  8.  DO NOT shower/wash with your normal soap after using and rinsing off  the CHG Soap.                9.  Pat yourself dry with a clean towel.            10.  Wear  clean pajamas.            11.  Place clean sheets on your bed the night of your first shower and do not  sleep with pets. Day of Surgery : Do not apply any lotions/deodorants the morning of surgery.  Please wear clean clothes to the hospital/surgery center.  FAILURE TO FOLLOW THESE INSTRUCTIONS MAY RESULT IN THE CANCELLATION OF YOUR SURGERY PATIENT SIGNATURE_________________________________  NURSE SIGNATURE__________________________________  ________________________________________________________________________

## 2019-09-11 ENCOUNTER — Other Ambulatory Visit: Payer: Self-pay

## 2019-09-11 ENCOUNTER — Encounter (HOSPITAL_COMMUNITY)
Admission: RE | Admit: 2019-09-11 | Discharge: 2019-09-11 | Disposition: A | Discharge status: Home / Self Care | Payer: Medicare Other | Source: Ambulatory Visit | Attending: Urology | Admitting: Urology

## 2019-09-11 ENCOUNTER — Encounter (HOSPITAL_COMMUNITY): Payer: Self-pay

## 2019-09-11 DIAGNOSIS — Z01812 Encounter for preprocedural laboratory examination: Secondary | ICD-10-CM | POA: Insufficient documentation

## 2019-09-11 LAB — BASIC METABOLIC PANEL
Anion gap: 6 (ref 5–15)
BUN: 18 mg/dL (ref 6–20)
CO2: 28 mmol/L (ref 22–32)
Calcium: 9.3 mg/dL (ref 8.9–10.3)
Chloride: 106 mmol/L (ref 98–111)
Creatinine, Ser: 1.96 mg/dL — ABNORMAL HIGH (ref 0.44–1.00)
GFR calc Af Amer: 32 mL/min — ABNORMAL LOW (ref >60)
GFR calc non Af Amer: 28 mL/min — ABNORMAL LOW (ref >60)
Glucose, Bld: 93 mg/dL (ref 70–99)
Potassium: 4.1 mmol/L (ref 3.5–5.1)
Sodium: 140 mmol/L (ref 135–145)

## 2019-09-11 LAB — CBC
HCT: 35.4 % — ABNORMAL LOW (ref 36.0–46.0)
Hemoglobin: 11.5 g/dL — ABNORMAL LOW (ref 12.0–15.0)
MCH: 31.4 pg (ref 26.0–34.0)
MCHC: 32.5 g/dL (ref 30.0–36.0)
MCV: 96.7 fL (ref 80.0–100.0)
Platelets: 340 10*3/uL (ref 150–400)
RBC: 3.66 MIL/uL — ABNORMAL LOW (ref 3.87–5.11)
RDW: 13.9 % (ref 11.5–15.5)
WBC: 7 10*3/uL (ref 4.0–10.5)
nRBC: 0 % (ref 0.0–0.2)

## 2019-09-11 NOTE — Progress Notes (Signed)
PCP - Forrest Moron, MD Cardiologist -   Chest x-ray -  EKG -  Stress Test -  ECHO -  Cardiac Cath -   Sleep Study - yes CPAP - no  Fasting Blood Sugar -  Checks Blood Sugar _____ times a day  Blood Thinner Instructions: Aspirin Instructions: Last Dose:  Anesthesia review:   Patient on Nucynta for chronic pain , managed at a pain clinic. Per PA advisory, patient instructed to contain pain doctor for management of nucynta with surgery . Patient reports she remained on nucynta with previous cystoscopy on 08/15/2019 and plans to continue nucynta for this upcoming surgery. PA made aware   Patient denies shortness of breath, fever, cough and chest pain at PAT appointment   Patient verbalized understanding of instructions that were given to them at the PAT appointment. Patient was also instructed that they will need to review over the PAT instructions again at home before surgery.

## 2019-09-11 NOTE — Anesthesia Preprocedure Evaluation (Addendum)
Anesthesia Evaluation  Patient identified by MRN, date of birth, ID band Patient awake    Reviewed: Allergy & Precautions, NPO status , Patient's Chart, lab work & pertinent test results  History of Anesthesia Complications Negative for: history of anesthetic complications  Airway Mallampati: II  TM Distance: >3 FB Neck ROM: Full    Dental  (+) Teeth Intact, Dental Advisory Given, Caps,    Pulmonary asthma ,    Pulmonary exam normal breath sounds clear to auscultation       Cardiovascular hypertension, Normal cardiovascular exam Rhythm:Regular Rate:Normal     Neuro/Psych  Headaches, Chronic back pain Trigeminal neuralgia  Neuromuscular disease    GI/Hepatic Neg liver ROS, GERD  Medicated and Controlled,  Endo/Other  negative endocrine ROS  Renal/GU Renal disease (Right ureteral stone)     Musculoskeletal  (+) Arthritis , Fibromyalgia -  Abdominal   Peds  Hematology negative hematology ROS (+)   Anesthesia Other Findings Day of surgery medications reviewed with the patient.  Reproductive/Obstetrics                            Anesthesia Physical Anesthesia Plan  ASA: II  Anesthesia Plan: General   Post-op Pain Management:    Induction: Intravenous  PONV Risk Score and Plan: 4 or greater and Diphenhydramine, Midazolam, Dexamethasone and Ondansetron  Airway Management Planned: LMA  Additional Equipment:   Intra-op Plan:   Post-operative Plan: Extubation in OR  Informed Consent: I have reviewed the patients History and Physical, chart, labs and discussed the procedure including the risks, benefits and alternatives for the proposed anesthesia with the patient or authorized representative who has indicated his/her understanding and acceptance.     Dental advisory given  Plan Discussed with: CRNA  Anesthesia Plan Comments:        Anesthesia Quick Evaluation

## 2019-09-12 ENCOUNTER — Encounter (HOSPITAL_COMMUNITY): Payer: Self-pay | Admitting: Urology

## 2019-09-12 ENCOUNTER — Ambulatory Visit (HOSPITAL_COMMUNITY): Payer: Medicare Other

## 2019-09-12 ENCOUNTER — Ambulatory Visit (HOSPITAL_COMMUNITY)
Admission: RE | Admit: 2019-09-12 | Discharge: 2019-09-12 | Disposition: A | Discharge status: Home / Self Care | Payer: Medicare Other | Attending: Urology | Admitting: Urology

## 2019-09-12 ENCOUNTER — Encounter (HOSPITAL_COMMUNITY)
Admission: RE | Disposition: A | Discharge status: Home / Self Care | Payer: Self-pay | Source: Home / Self Care | Attending: Urology

## 2019-09-12 ENCOUNTER — Ambulatory Visit (HOSPITAL_COMMUNITY): Payer: Medicare Other | Admitting: Certified Registered"

## 2019-09-12 DIAGNOSIS — Z791 Long term (current) use of non-steroidal anti-inflammatories (NSAID): Secondary | ICD-10-CM | POA: Diagnosis not present

## 2019-09-12 DIAGNOSIS — K589 Irritable bowel syndrome without diarrhea: Secondary | ICD-10-CM | POA: Insufficient documentation

## 2019-09-12 DIAGNOSIS — K219 Gastro-esophageal reflux disease without esophagitis: Secondary | ICD-10-CM | POA: Diagnosis not present

## 2019-09-12 DIAGNOSIS — I10 Essential (primary) hypertension: Secondary | ICD-10-CM | POA: Diagnosis not present

## 2019-09-12 DIAGNOSIS — G43909 Migraine, unspecified, not intractable, without status migrainosus: Secondary | ICD-10-CM | POA: Insufficient documentation

## 2019-09-12 DIAGNOSIS — J449 Chronic obstructive pulmonary disease, unspecified: Secondary | ICD-10-CM | POA: Diagnosis not present

## 2019-09-12 DIAGNOSIS — G8929 Other chronic pain: Secondary | ICD-10-CM | POA: Diagnosis not present

## 2019-09-12 DIAGNOSIS — N202 Calculus of kidney with calculus of ureter: Secondary | ICD-10-CM | POA: Insufficient documentation

## 2019-09-12 DIAGNOSIS — M797 Fibromyalgia: Secondary | ICD-10-CM | POA: Diagnosis not present

## 2019-09-12 DIAGNOSIS — J45909 Unspecified asthma, uncomplicated: Secondary | ICD-10-CM | POA: Insufficient documentation

## 2019-09-12 DIAGNOSIS — Z79899 Other long term (current) drug therapy: Secondary | ICD-10-CM | POA: Insufficient documentation

## 2019-09-12 HISTORY — PX: CYSTOSCOPY WITH RETROGRADE PYELOGRAM, URETEROSCOPY AND STENT PLACEMENT: SHX5789

## 2019-09-12 SURGERY — CYSTOURETEROSCOPY, WITH RETROGRADE PYELOGRAM AND STENT INSERTION
Anesthesia: General | Laterality: Right

## 2019-09-12 MED ORDER — DEXAMETHASONE SODIUM PHOSPHATE 10 MG/ML IJ SOLN
INTRAMUSCULAR | Status: DC | PRN
  Administered 2019-09-12: 8 mg via INTRAVENOUS

## 2019-09-12 MED ORDER — LABETALOL HCL 5 MG/ML IV SOLN
INTRAVENOUS | Status: AC
  Filled 2019-09-12: qty 4

## 2019-09-12 MED ORDER — ONDANSETRON HCL 4 MG/2ML IJ SOLN
INTRAMUSCULAR | Status: DC | PRN
  Administered 2019-09-12: 4 mg via INTRAVENOUS

## 2019-09-12 MED ORDER — ACETAMINOPHEN 500 MG PO TABS
1000.0000 mg | ORAL_TABLET | Freq: Once | ORAL | Status: AC
  Administered 2019-09-12: 07:00:00 1000 mg via ORAL
  Filled 2019-09-12: qty 2

## 2019-09-12 MED ORDER — FENTANYL CITRATE (PF) 100 MCG/2ML IJ SOLN: 25.0000 ug | INTRAMUSCULAR | Status: DC | PRN

## 2019-09-12 MED ORDER — OXYCODONE HCL 5 MG PO TABS
ORAL_TABLET | ORAL | Status: AC
  Filled 2019-09-12: qty 2

## 2019-09-12 MED ORDER — FENTANYL CITRATE (PF) 100 MCG/2ML IJ SOLN
INTRAMUSCULAR | Status: AC
  Filled 2019-09-12: qty 2

## 2019-09-12 MED ORDER — PROPOFOL 10 MG/ML IV BOLUS
INTRAVENOUS | Status: AC
  Filled 2019-09-12: qty 40

## 2019-09-12 MED ORDER — PROPOFOL 10 MG/ML IV BOLUS
INTRAVENOUS | Status: DC | PRN
  Administered 2019-09-12: 120 mg via INTRAVENOUS

## 2019-09-12 MED ORDER — LIDOCAINE 2% (20 MG/ML) 5 ML SYRINGE
INTRAMUSCULAR | Status: DC | PRN
  Administered 2019-09-12: 60 mg via INTRAVENOUS

## 2019-09-12 MED ORDER — OXYCODONE HCL 5 MG PO TABS
10.0000 mg | ORAL_TABLET | Freq: Once | ORAL | Status: AC
  Administered 2019-09-12: 10 mg via ORAL

## 2019-09-12 MED ORDER — FENTANYL CITRATE (PF) 100 MCG/2ML IJ SOLN
INTRAMUSCULAR | Status: DC | PRN
  Administered 2019-09-12: 50 ug via INTRAVENOUS
  Administered 2019-09-12: 25 ug via INTRAVENOUS

## 2019-09-12 MED ORDER — ONDANSETRON HCL 4 MG/2ML IJ SOLN
INTRAMUSCULAR | Status: AC
  Filled 2019-09-12: qty 2

## 2019-09-12 MED ORDER — MIDAZOLAM HCL 2 MG/2ML IJ SOLN
INTRAMUSCULAR | Status: DC | PRN
  Administered 2019-09-12: 2 mg via INTRAVENOUS

## 2019-09-12 MED ORDER — MIDAZOLAM HCL 2 MG/2ML IJ SOLN
INTRAMUSCULAR | Status: AC
  Filled 2019-09-12: qty 2

## 2019-09-12 MED ORDER — SULFAMETHOXAZOLE-TRIMETHOPRIM 800-160 MG PO TABS: 1.0000 | ORAL_TABLET | Freq: Two times a day (BID) | ORAL | 0 refills | Status: DC

## 2019-09-12 MED ORDER — CIPROFLOXACIN IN D5W 400 MG/200ML IV SOLN
400.0000 mg | INTRAVENOUS | Status: AC
  Administered 2019-09-12: 09:00:00 400 mg via INTRAVENOUS
  Filled 2019-09-12: qty 200

## 2019-09-12 MED ORDER — DEXAMETHASONE SODIUM PHOSPHATE 10 MG/ML IJ SOLN
INTRAMUSCULAR | Status: AC
  Filled 2019-09-12: qty 1

## 2019-09-12 MED ORDER — LACTATED RINGERS IV SOLN: INTRAVENOUS | Status: DC

## 2019-09-12 MED ORDER — LABETALOL HCL 5 MG/ML IV SOLN
10.0000 mg | Freq: Once | INTRAVENOUS | Status: AC
  Administered 2019-09-12: 10 mg via INTRAVENOUS

## 2019-09-12 MED ORDER — LIDOCAINE 2% (20 MG/ML) 5 ML SYRINGE
INTRAMUSCULAR | Status: AC
  Filled 2019-09-12: qty 5

## 2019-09-12 MED ORDER — PROMETHAZINE HCL 25 MG/ML IJ SOLN: 6.2500 mg | INTRAMUSCULAR | Status: DC | PRN

## 2019-09-12 MED ORDER — SODIUM CHLORIDE 0.9 % IR SOLN
Status: DC | PRN
  Administered 2019-09-12: 6000 mL

## 2019-09-12 MED ORDER — PHENYLEPHRINE 40 MCG/ML (10ML) SYRINGE FOR IV PUSH (FOR BLOOD PRESSURE SUPPORT)
PREFILLED_SYRINGE | INTRAVENOUS | Status: DC | PRN
  Administered 2019-09-12 (×3): 40 ug via INTRAVENOUS

## 2019-09-12 MED ORDER — IOHEXOL 300 MG/ML  SOLN
INTRAMUSCULAR | Status: DC | PRN
  Administered 2019-09-12: 4 mL

## 2019-09-12 SURGICAL SUPPLY — 22 items
BAG URO CATCHER STRL LF (MISCELLANEOUS) ×3 IMPLANT
BASKET LASER NITINOL 1.9FR (BASKET) ×3 IMPLANT
BASKET STONE 1.7 NGAGE (UROLOGICAL SUPPLIES) ×3 IMPLANT
BASKET ZERO TIP NITINOL 2.4FR (BASKET) IMPLANT
CATH INTERMIT  6FR 70CM (CATHETERS) IMPLANT
CLOTH BEACON ORANGE TIMEOUT ST (SAFETY) ×3 IMPLANT
FIBER LASER FLEXIVA 365 (UROLOGICAL SUPPLIES) IMPLANT
FIBER LASER TRAC TIP (UROLOGICAL SUPPLIES) ×3 IMPLANT
GLOVE BIOGEL M 8.0 STRL (GLOVE) ×3 IMPLANT
GOWN STRL REUS W/TWL XL LVL3 (GOWN DISPOSABLE) ×3 IMPLANT
GUIDEWIRE ANG ZIPWIRE 038X150 (WIRE) ×3 IMPLANT
GUIDEWIRE STR DUAL SENSOR (WIRE) ×6 IMPLANT
IV NS 1000ML (IV SOLUTION) ×2
IV NS 1000ML BAXH (IV SOLUTION) ×1 IMPLANT
KIT TURNOVER KIT A (KITS) IMPLANT
MANIFOLD NEPTUNE II (INSTRUMENTS) ×3 IMPLANT
PACK CYSTO (CUSTOM PROCEDURE TRAY) ×3 IMPLANT
SHEATH URETERAL 12FRX35CM (MISCELLANEOUS) ×3 IMPLANT
STENT URET 6FRX24 CONTOUR (STENTS) ×3 IMPLANT
TUBING CONNECTING 10 (TUBING) ×2 IMPLANT
TUBING CONNECTING 10' (TUBING) ×1
TUBING UROLOGY SET (TUBING) ×3 IMPLANT

## 2019-09-12 NOTE — H&P (Signed)
H&P  Chief Complaint: Kidney stones  History of Present Illness: Cassidy Olsen is a 59 y.o. year old female presenting for URS mmgmt of rt ureretal and renal calculi. She recently presented for urgent mgmt of an infected left ureteral stone--initially had stenting followed by eventual URS/HLL/stent. At the time, rt renal calculi were asymptomatic and we decided to observe. However, she had recent presentation with symptomatic rt ureteral stone and now presents for anesthetic cysto,RGP, URS/HLL/stone extraction w/ stent.  Past Medical History:  Diagnosis Date  . Asthma   . Chronic pain   . Family history of breast cancer   . Family history of prostate cancer   . GERD (gastroesophageal reflux disease)   . High cholesterol   . IBS (irritable bowel syndrome)   . Migraine   . Occipital neuralgia   . Osteoarthritis   . Spinal stenosis   . Trigeminal neuralgia   . Trigeminal neuralgia     Past Surgical History:  Procedure Laterality Date  . ABDOMINAL HYSTERECTOMY    . ANKLE SURGERY Left 05/2009  . APPENDECTOMY    . BACK SURGERY    . 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 Pershing Proud at [facility]  . BRAIN SURGERY    . CARPAL TUNNEL RELEASE  2014  . CYSTOSCOPY W/ URETERAL STENT PLACEMENT Left 07/25/2019   Procedure: CYSTOSCOPY WITH RETROGRADE PYELOGRAM/URETERAL STENT PLACEMENT;  Surgeon: Franchot Gallo, MD;  Location: [facility];  Service: Urology;  Laterality: Left;  . CYSTOSCOPY W/ URETERAL STENT REMOVAL Left 08/15/2019   Procedure: CYSTOSCOPY WITH STENT REMOVAL;  Surgeon: Franchot Gallo, MD;  Location: [facility];  Service: Urology;  Laterality: Left;  . CYSTOSCOPY WITH RETROGRADE PYELOGRAM, URETEROSCOPY AND STENT PLACEMENT Left 08/15/2019   Procedure: CYSTOSCOPY WITH RETROGRADE PYELOGRAM, URETEROSCOPY;  Surgeon: Franchot Gallo, MD;  Location: [facility];  Service: Urology;  Laterality: Left;  90 MINS  . HOLMIUM LASER APPLICATION Left 12/07/7780    Procedure: HOLMIUM LASER APPLICATION;  Surgeon: Franchot Gallo, MD;  Location: [facility];  Service: Urology;  Laterality: Left;  . JOINT REPLACEMENT     R knee  . OVARIAN CYST REMOVAL  05/2010  . right knee revision  2016  . ROTATOR CUFF REPAIR Right 02/2019  . SPINE SURGERY    . TUBAL LIGATION      Home Medications:  No medications prior to admission.    Allergies:  Allergies  Allergen Reactions  . Toradol [Ketorolac Tromethamine] Shortness Of Breath, Itching and Other (See Comments)    IM administration ONLY  REDNESS  . Aspirin Other (See Comments)    Stomach upset  . Codeine     Cough meds with codeine-have withdraw symptoms when done  . Ibuprofen     Stomach upset  . Latex     Vaginal irritation  . Penicillins Hives    Did it involve swelling of the face/tongue/throat, SOB, or low BP? No Did it involve sudden or severe rash/hives, skin peeling, or any reaction on the inside of your mouth or nose? Yes Did you need to seek medical attention at a hospital or doctor's office? No When did it last happen?2000 If all above answers are "NO", may proceed with cephalosporin use.     Family History  Problem Relation Age of Onset  . Ulcers Mother 89       peptic ulcer  . Prostate cancer Father 50  . Breast cancer Sister 43       ER+/PR+/Her2- breast  cancer  . Breast cancer Paternal Grandmother   . Diabetes Paternal Grandmother   . Cancer Paternal Uncle        unknown cancers    Social History:  reports that she has never smoked. She has never used smokeless tobacco. She reports current alcohol use. She reports that she does not use drugs.  ROS: A complete review of systems was performed.  All systems are negative except for pertinent findings as noted.  Physical Exam:  Vital signs in last 24 hours: Temp:  [98.5 F (36.9 C)] 98.5 F (36.9 C) (02/03 1432) Pulse Rate:  [104] 104 (02/03 1432) Resp:  [18] 18 (02/03 1432) BP: (142)/(92) 142/92 (02/03  1432) SpO2:  [100 %] 100 % (02/03 1432) Weight:  [72.1 kg-76.7 kg] 76.7 kg (02/03 1432) General:  Alert and oriented, No acute distress HEENT: Normocephalic, atraumatic Neck: No JVD or lymphadenopathy Cardiovascular: Regular rate and rhythm Lungs: Clear bilaterally Abdomen: Soft, nontender, nondistended, no abdominal masses Back: No CVA tenderness Extremities: No edema Neurologic: Grossly intact  Laboratory Data:  Results for orders placed or performed during the hospital encounter of 09/11/19 (from the past 24 hour(s))  Basic metabolic panel     Status: Abnormal   Collection Time: 09/11/19  2:34 PM  Result Value Ref Range   Sodium 140 135 - 145 mmol/L   Potassium 4.1 3.5 - 5.1 mmol/L   Chloride 106 98 - 111 mmol/L   CO2 28 22 - 32 mmol/L   Glucose, Bld 93 70 - 99 mg/dL   BUN 18 6 - 20 mg/dL   Creatinine, Ser 1.96 (H) 0.44 - 1.00 mg/dL   Calcium 9.3 8.9 - 10.3 mg/dL   GFR calc non Af Amer 28 (L) >60 mL/min   GFR calc Af Amer 32 (L) >60 mL/min   Anion gap 6 5 - 15  CBC     Status: Abnormal   Collection Time: 09/11/19  2:34 PM  Result Value Ref Range   WBC 7.0 4.0 - 10.5 K/uL   RBC 3.66 (L) 3.87 - 5.11 MIL/uL   Hemoglobin 11.5 (L) 12.0 - 15.0 g/dL   HCT 35.4 (L) 36.0 - 46.0 %   MCV 96.7 80.0 - 100.0 fL   MCH 31.4 26.0 - 34.0 pg   MCHC 32.5 30.0 - 36.0 g/dL   RDW 13.9 11.5 - 15.5 %   Platelets 340 150 - 400 K/uL   nRBC 0.0 0.0 - 0.2 %   Recent Results (from the past 240 hour(s))  SARS CORONAVIRUS 2 (TAT 6-24 HRS) Nasopharyngeal Nasopharyngeal Swab     Status: None   Collection Time: 09/10/19  3:03 PM   Specimen: Nasopharyngeal Swab  Result Value Ref Range Status   SARS Coronavirus 2 NEGATIVE NEGATIVE Final    Comment: (NOTE) SARS-CoV-2 target nucleic acids are NOT DETECTED. The SARS-CoV-2 RNA is generally detectable in upper and lower respiratory specimens during the acute phase of infection. Negative results do not preclude SARS-CoV-2 infection, do not rule  out co-infections with other pathogens, and should not be used as the sole basis for treatment or other patient management decisions. Negative results must be combined with clinical observations, patient history, and epidemiological information. The expected result is Negative. Fact Sheet for Patients: SugarRoll.be Fact Sheet for Healthcare Providers: https://www.woods-mathews.com/ This test is not yet approved or cleared by the [place] FDA and  has been authorized for detection and/or diagnosis of SARS-CoV-2 by FDA under an Emergency Use Authorization (EUA). This EUA will  remain  in effect (meaning this test can be used) for the duration of the COVID-19 declaration under Section 56 4(b)(1) of the Act, 21 U.S.C. section 360bbb-3(b)(1), unless the authorization is terminated or revoked sooner. Performed at [facility], [place] [address] [place], [place] [address]    Creatinine: Recent Labs    09/11/19 1434  CREATININE 1.96*    Radiologic Imaging: No results found.  Impression/Assessment:  Rt ureteral and renal calculi  Plan:  Cysto, Rt RGP, RT URS/HLL/extraction of stones, J2 stent  Lillette Boxer Chiyoko Torrico 09/12/2019, 6:31 AM  Lillette Boxer. Katyra Tomassetti MD

## 2019-09-12 NOTE — Transfer of Care (Signed)
Immediate Anesthesia Transfer of Care Note  Patient: Cassidy Olsen  Procedure(s) Performed: CYSTOSCOPY WITH RIGHT RETROGRADE PYELOGRAM  URETEROSCOPY WITH HOLMIUM LASER STONE EXTRACTION AND STENT PLACEMENT (Right )  Patient Location: PACU  Anesthesia Type:General  Level of Consciousness: oriented, drowsy and patient cooperative  Airway & Oxygen Therapy: Patient Spontanous Breathing and Patient connected to face mask oxygen  Post-op Assessment: Report given to RN and Post -op Vital signs reviewed and stable  Post vital signs: Reviewed and stable  Last Vitals:  Vitals Value Taken Time  BP    Temp    Pulse 85 09/12/19 0959  Resp 10 09/12/19 0959  SpO2 100 % 09/12/19 0959  Vitals shown include unvalidated device data.  Last Pain:  Vitals:   09/12/19 0710  TempSrc:   PainSc: 5       Patients Stated Pain Goal: 4 ([id number])  Complications: No apparent anesthesia complications

## 2019-09-12 NOTE — Op Note (Signed)
Preoperative diagnosis: Right ureteral and renal calculi  Postoperative diagnosis: Same  Principal procedure: Cystoscopy, right retrograde ureteropyelogram with fluoroscopic interpretation, right ureteroscopy-semirigid and flexible, holmium laser lithotripsy and extraction of right ureteral and renal calculi, placement of 6 French by 24 cm contour double-J stent with tether  Surgeon: Keiandre Cygan  Anesthesia: General with LMA  Specimens: Stone fragments  Drains: 24 x 6 contour double-J stent with tether  Complications: None  Estimated blood loss: Less than 25 mL  Indications: 59 year old female with history of urolithiasis.  In January 2021 she underwent urgent placement of a stent for an obstructing, infected left ureteral stone followed by eventual ureteroscopic management of this.  She has remaining right renal calculi, and became symptomatic last week with right flank pain with one of the renal calculi now progressing into the ureter.  This was proximal in nature due to the size.  She presents at this time for ureteroscopic management of these.  The procedure as well as risks and complications have been discussed with the patient.  These include but are not limited to infection, need for stent, bleeding, anesthetic complications, ureteral injury, among others.  She understands these and desires to proceed.  Findings: The urothelium of the bladder was normal.  Ureteral orifice ease were normally placed and of normal configuration.  Retrograde study of the ureter using Omnipaque revealed a normal distal and mid ureter with a filling defect of the proximal ureter consistent with her symptomatic stone.  There was proximal pyelocaliectasis.  Filling defects were noted in the lower pole consistent with stones.  Description of procedure: The patient was properly identified and marked in the holding area.  She was taken to the operating room where general anesthetic was administered, she was also  given preoperative IV antibiotics.  She was placed in the dorsolithotomy position, genitalia and perineum were prepped and draped, timeout was performed.  21 French panendoscope advanced into the bladder and retrograde study was performed with the 6 [place] open-ended catheter with the above-mentioned findings.  Sensor tip guidewire was advanced into the upper pole calyceal system past the somewhat obstructing right proximal ureteral stone.  The ureter was dilated both with the obturator and the entire 12/14 medium length ureteral access catheter.  Safety wire was placed.  The semirigid ureteroscope was advanced into the proximal ureter without difficulty and the stone fragmented with laser energy, administered through a 200 m fiber with multiple smaller fragments obtained.  Unfortunately, these were rinsed into the renal pelvis and I could not reach these with the ureteroscope.  I then passed the ureteral access catheter and the flexible scope was then passed into the renal pelvis.  The smaller fragments were easily brought out through the access catheter using the engage basket.  The large stones in the lower pole calyceal system were fragmented using laser energy through the same 200 m fiber.  Multiple fragments were then removed through the access catheter using the engage basket.  Several larger fragments had to be refragmented with the laser energy, these could then be removed.  There was one of the lower pole calyces located medially that was somewhat hard to access and some smaller fragments were unfortunately left in here because of inability to grasp them.  However, the remaining calyces and stone burden were inspected and removed with the engage basket.  At this point, following removal of all identifiable fragments, the ureteroscope was removed.  The access catheter was then removed.  The safety guidewire was backloaded through  the cystoscope and a 24 cm x 6 French contour double-J catheter was then  placed using fluoroscopic and cystoscopic guidance.  The tether was left on.  Excellent proximal and distal curls were seen following removal of the guidewire.  The bladder was drained.  The scope was removed, the tether trimmed, tied in a knot and then tucked into the vagina.  The patient tolerated procedure well.  She was awakened and taken to the PACU in stable condition.  She tolerated the procedure well.

## 2019-09-12 NOTE — Anesthesia Procedure Notes (Signed)
Procedure Name: LMA Insertion Date/Time: 09/12/2019 8:29 AM Performed by: Eben Burow, CRNA Pre-anesthesia Checklist: Patient identified, Emergency Drugs available, Suction available, Patient being monitored and Timeout performed Patient Re-evaluated:Patient Re-evaluated prior to induction Oxygen Delivery Method: Circle system utilized Preoxygenation: Pre-oxygenation with 100% oxygen Induction Type: IV induction Ventilation: Mask ventilation without difficulty LMA: LMA inserted LMA Size: 4.0 Tube secured with: Tape Dental Injury: Teeth and Oropharynx as per pre-operative assessment

## 2019-09-12 NOTE — Discharge Instructions (Signed)
1. You may see some blood in the urine and may have some burning with urination for 48-72 hours. You also may notice that you have to urinate more frequently or urgently after your procedure which is normal.  2. You should call should you develop an inability urinate, fever > 101, persistent nausea and vomiting that prevents you from eating or drinking to stay hydrated.  3. If you have a stent, you will likely urinate more frequently and urgently until the stent is removed and you may experience some discomfort/pain in the lower abdomen and flank especially when urinating. You may take pain medication prescribed to you if needed for pain. You may also intermittently have blood in the urine until the stent is removed.  It is okay to pull the string to remove the stent on Monday morning. 4. If you have a catheter, you will be taught how to take care of the catheter by the nursing staff prior to discharge from the hospital.  You may periodically feel a strong urge to void with the catheter in place.  This is a bladder spasm and most often can occur when having a bowel movement or moving around. It is typically self-limited and usually will stop after a few minutes.  You may use some Vaseline or Neosporin around the tip of the catheter to reduce friction at the tip of the penis. You may also see some blood in the urine.  A very small amount of blood can make the urine look quite red.  As long as the catheter is draining well, there usually is not a problem.  However, if the catheter is not draining well and is bloody, you should call the office [phone]) to notify us.

## 2019-09-12 NOTE — Interval H&P Note (Signed)
History and Physical Interval Note:  09/12/2019 8:13 AM  Cassidy Olsen  has presented today for surgery, with the diagnosis of RIGHT URETERAL  RENAL STONE.  The various methods of treatment have been discussed with the patient and family. After consideration of risks, benefits and other options for treatment, the patient has consented to  Procedure(s): CYSTOSCOPY WITH RIGHT RETROGRADE PYELOGRAM  URETEROSCOPY WITH HOLMIUM LASER STONE EXTRACTION AND STENT PLACEMENT (Right) as a surgical intervention.  The patient's history has been reviewed, patient examined, no change in status, stable for surgery.  I have reviewed the patient's chart and labs.  Questions were answered to the patient's satisfaction.     Lillette Boxer Jagjit Riner

## 2019-09-12 NOTE — Anesthesia Postprocedure Evaluation (Signed)
Anesthesia Post Note  Patient: Cassidy Olsen  Procedure(s) Performed: CYSTOSCOPY WITH RIGHT RETROGRADE PYELOGRAM  URETEROSCOPY WITH HOLMIUM LASER STONE EXTRACTION AND STENT PLACEMENT (Right )     Patient location during evaluation: PACU Anesthesia Type: General Level of consciousness: awake and alert Pain management: pain level controlled Vital Signs Assessment: post-procedure vital signs reviewed and stable Respiratory status: spontaneous breathing, nonlabored ventilation and respiratory function stable Cardiovascular status: blood pressure returned to baseline and stable Postop Assessment: no apparent nausea or vomiting Anesthetic complications: no    Last Vitals:  Vitals:   09/12/19 1110 09/12/19 1215  BP: (!) 131/106 136/88  Pulse: 71 76  Resp: 10 12  Temp: 36.5 C 36.5 C  SpO2: 98% 98%    Last Pain:  Vitals:   09/12/19 1215  TempSrc:   PainSc: 8                  Catalina Gravel

## 2019-09-16 ENCOUNTER — Telehealth: Payer: Self-pay | Admitting: Gastroenterology

## 2019-09-16 NOTE — Telephone Encounter (Signed)
Dr. Silverio Decamp, this pt is referred for abdominal pain, bilateral renal stones and constipation.  Pt has been Dr. Michail Sermon within the past year and requested to transfer GI care because she is not satisfied with [facility].  Records will be sent to you for review.  Please advise scheduling.

## 2019-09-18 ENCOUNTER — Telehealth: Payer: Self-pay | Admitting: *Deleted

## 2019-09-18 ENCOUNTER — Ambulatory Visit
Admission: RE | Admit: 2019-09-18 | Discharge: 2019-09-18 | Disposition: A | Discharge status: Home / Self Care | Payer: Medicare Other | Source: Ambulatory Visit | Attending: Pain Medicine | Admitting: Pain Medicine

## 2019-09-18 DIAGNOSIS — M542 Cervicalgia: Secondary | ICD-10-CM

## 2019-09-18 DIAGNOSIS — N202 Calculus of kidney with calculus of ureter: Secondary | ICD-10-CM | POA: Diagnosis not present

## 2019-09-18 DIAGNOSIS — M4802 Spinal stenosis, cervical region: Secondary | ICD-10-CM | POA: Diagnosis not present

## 2019-09-18 DIAGNOSIS — R8271 Bacteriuria: Secondary | ICD-10-CM | POA: Diagnosis not present

## 2019-09-18 DIAGNOSIS — R1084 Generalized abdominal pain: Secondary | ICD-10-CM | POA: Diagnosis not present

## 2019-09-18 NOTE — Telephone Encounter (Signed)
amitiza was approved 11/6-2/42022

## 2019-09-23 ENCOUNTER — Ambulatory Visit: Payer: Medicare Other

## 2019-09-25 DIAGNOSIS — M25512 Pain in left shoulder: Secondary | ICD-10-CM | POA: Diagnosis not present

## 2019-09-25 DIAGNOSIS — M7502 Adhesive capsulitis of left shoulder: Secondary | ICD-10-CM | POA: Diagnosis not present

## 2019-09-25 DIAGNOSIS — R1031 Right lower quadrant pain: Secondary | ICD-10-CM | POA: Diagnosis not present

## 2019-09-25 DIAGNOSIS — N202 Calculus of kidney with calculus of ureter: Secondary | ICD-10-CM | POA: Diagnosis not present

## 2019-09-25 DIAGNOSIS — M6281 Muscle weakness (generalized): Secondary | ICD-10-CM | POA: Diagnosis not present

## 2019-09-25 DIAGNOSIS — R1084 Generalized abdominal pain: Secondary | ICD-10-CM | POA: Diagnosis not present

## 2019-09-30 DIAGNOSIS — M961 Postlaminectomy syndrome, not elsewhere classified: Secondary | ICD-10-CM | POA: Diagnosis not present

## 2019-09-30 DIAGNOSIS — M5136 Other intervertebral disc degeneration, lumbar region: Secondary | ICD-10-CM | POA: Diagnosis not present

## 2019-09-30 DIAGNOSIS — G894 Chronic pain syndrome: Secondary | ICD-10-CM | POA: Diagnosis not present

## 2019-09-30 DIAGNOSIS — M47812 Spondylosis without myelopathy or radiculopathy, cervical region: Secondary | ICD-10-CM | POA: Diagnosis not present

## 2019-10-02 ENCOUNTER — Ambulatory Visit
Admission: RE | Admit: 2019-10-02 | Discharge: 2019-10-02 | Disposition: A | Discharge status: Home / Self Care | Payer: Medicare Other | Source: Ambulatory Visit | Attending: Obstetrics and Gynecology | Admitting: Obstetrics and Gynecology

## 2019-10-02 ENCOUNTER — Other Ambulatory Visit: Payer: Self-pay

## 2019-10-02 ENCOUNTER — Ambulatory Visit: Payer: Medicare Other

## 2019-10-02 DIAGNOSIS — Z1231 Encounter for screening mammogram for malignant neoplasm of breast: Secondary | ICD-10-CM | POA: Diagnosis not present

## 2019-10-03 DIAGNOSIS — M7502 Adhesive capsulitis of left shoulder: Secondary | ICD-10-CM | POA: Diagnosis not present

## 2019-10-03 DIAGNOSIS — M6281 Muscle weakness (generalized): Secondary | ICD-10-CM | POA: Diagnosis not present

## 2019-10-03 DIAGNOSIS — M25512 Pain in left shoulder: Secondary | ICD-10-CM | POA: Diagnosis not present

## 2019-10-09 ENCOUNTER — Telehealth: Payer: Self-pay | Admitting: Neurology

## 2019-10-09 DIAGNOSIS — N952 Postmenopausal atrophic vaginitis: Secondary | ICD-10-CM | POA: Diagnosis not present

## 2019-10-09 DIAGNOSIS — Z01411 Encounter for gynecological examination (general) (routine) with abnormal findings: Secondary | ICD-10-CM | POA: Diagnosis not present

## 2019-10-09 DIAGNOSIS — A6004 Herpesviral vulvovaginitis: Secondary | ICD-10-CM | POA: Diagnosis not present

## 2019-10-09 NOTE — Telephone Encounter (Signed)
Can we schedule follow up if appropriate?

## 2019-10-09 NOTE — Telephone Encounter (Signed)
Pt called requested a CB about her headaches and would like to know if she can get another Botox.   Please follow up.

## 2019-10-10 ENCOUNTER — Other Ambulatory Visit: Payer: Self-pay | Admitting: Obstetrics and Gynecology

## 2019-10-10 DIAGNOSIS — M810 Age-related osteoporosis without current pathological fracture: Secondary | ICD-10-CM

## 2019-10-10 NOTE — Telephone Encounter (Signed)
Patient has been scheduled. DWD

## 2019-10-10 NOTE — Progress Notes (Deleted)
Consent Form Botulism Toxin Injection For Chronic Migraine    Reviewed orally with patient, additionally signature is on file:  Botulism toxin has been approved by the Federal drug administration for treatment of chronic migraine. Botulism toxin does not cure chronic migraine and it may not be effective in some patients.  The administration of botulism toxin is accomplished by injecting a small amount of toxin into the muscles of the neck and head. Dosage must be titrated for each individual. Any benefits resulting from botulism toxin tend to wear off after 3 months with a repeat injection required if benefit is to be maintained. Injections are usually done every 3-4 months with maximum effect peak achieved by about 2 or 3 weeks. Botulism toxin is expensive and you should be sure of what costs you will incur resulting from the injection.  The side effects of botulism toxin use for chronic migraine may include:   -Transient, and usually mild, facial weakness with facial injections  -Transient, and usually mild, head or neck weakness with head/neck injections  -Reduction or loss of forehead facial animation due to forehead muscle weakness  -Eyelid drooping  -Dry eye  -Pain at the site of injection or bruising at the site of injection  -Double vision  -Potential unknown long term risks   Contraindications: You should not have Botox if you are pregnant, nursing, allergic to albumin, have an infection, skin condition, or muscle weakness at the site of the injection, or have myasthenia gravis, Lambert-Eaton syndrome, or ALS.  It is also possible that as with any injection, there may be an allergic reaction or no effect from the medication. Reduced effectiveness after repeated injections is sometimes seen and rarely infection at the injection site may occur. All care will be taken to prevent these side effects. If therapy is given over a long time, atrophy and wasting in the muscle injected may  occur. Occasionally the patient's become refractory to treatment because they develop antibodies to the toxin. In this event, therapy needs to be modified.  I have read the above information and consent to the administration of botulism toxin.    BOTOX PROCEDURE NOTE FOR MIGRAINE HEADACHE  Contraindications and precautions discussed with patient(above). Aseptic procedure was observed and patient tolerated procedure. Procedure performed by Debbora Presto, FNP-C.   The condition has existed for more than 6 months, and pt does not have a diagnosis of ALS, Myasthenia Gravis or Lambert-Eaton Syndrome.  Risks and benefits of injections discussed and pt agrees to proceed with the procedure.  Written consent obtained  These injections are medically necessary. Pt  receives good benefits from these injections. These injections do not cause sedations or hallucinations which the oral therapies may cause.   Description of procedure:  The patient was placed in a sitting position. The standard protocol was used for Botox as follows, with 5 units of Botox injected at each site:  -Procerus muscle, midline injection  -Corrugator muscle, bilateral injection  -Frontalis muscle, bilateral injection, with 2 sites each side, medial injection was performed in the upper one third of the frontalis muscle, in the region vertical from the medial inferior edge of the superior orbital rim. The lateral injection was again in the upper one third of the forehead vertically above the lateral limbus of the cornea, 1.5 cm lateral to the medial injection site.  -Temporalis muscle injection, 4 sites, bilaterally. The first injection was 3 cm above the tragus of the ear, second injection site was 1.5 cm to  3 cm up from the first injection site in line with the tragus of the ear. The third injection site was 1.5-3 cm forward between the first 2 injection sites. The fourth injection site was 1.5 cm posterior to the second injection  site. 5th site laterally in the temporalis  muscleat the level of the outer canthus.  -Occipitalis muscle injection, 3 sites, bilaterally. The first injection was done one half way between the occipital protuberance and the tip of the mastoid process behind the ear. The second injection site was done lateral and superior to the first, 1 fingerbreadth from the first injection. The third injection site was 1 fingerbreadth superiorly and medially from the first injection site.  -Cervical paraspinal muscle injection, 2 sites, bilateral knee first injection site was 1 cm from the midline of the cervical spine, 3 cm inferior to the lower border of the occipital protuberance. The second injection site was 1.5 cm superiorly and laterally to the first injection site.  -Trapezius muscle injection was performed at 3 sites, bilaterally. The first injection site was in the upper trapezius muscle halfway between the inflection point of the neck, and the acromion. The second injection site was one half way between the acromion and the first injection site. The third injection was done between the first injection site and the inflection point of the neck.   Will return for repeat injection in 3 months.   A total of 200 units of Botox was prepared, 155 units of Botox was injected as documented above, any Botox not injected was wasted. The patient tolerated the procedure well, there were no complications of the above procedure.

## 2019-10-11 ENCOUNTER — Encounter: Payer: Self-pay | Admitting: Gastroenterology

## 2019-10-14 ENCOUNTER — Ambulatory Visit: Payer: Medicare Other | Admitting: Family Medicine

## 2019-10-14 ENCOUNTER — Telehealth: Payer: Self-pay

## 2019-10-14 ENCOUNTER — Telehealth: Payer: Self-pay | Admitting: Neurology

## 2019-10-14 NOTE — Telephone Encounter (Signed)
Patient was a no call/no show for their appointment today.   

## 2019-10-14 NOTE — Telephone Encounter (Signed)
Pt would like a call with next available Botox date

## 2019-10-15 NOTE — Telephone Encounter (Signed)
Patient no showed her apt with Amy but called back to rs the apt, Amy has no availability until the 2nd week of April, when I offered the patient this apt she was extremely upset. She has requested a message be sent to Dr. Jaynee Eagles because she says there is no way she can wait this long for relief. Patient has requested a call back by Dr. Cathren Laine nurse. She feels she is being singled out by our office for missing her apt. Please call and advise.

## 2019-10-16 DIAGNOSIS — M6281 Muscle weakness (generalized): Secondary | ICD-10-CM | POA: Diagnosis not present

## 2019-10-16 DIAGNOSIS — M7502 Adhesive capsulitis of left shoulder: Secondary | ICD-10-CM | POA: Diagnosis not present

## 2019-10-16 DIAGNOSIS — M25512 Pain in left shoulder: Secondary | ICD-10-CM | POA: Diagnosis not present

## 2019-10-16 NOTE — Telephone Encounter (Signed)
Amy had a cancellation. Spoke with pt this afternoon and offered her the next opening on 11/06/19 @ 1:00 pm arrival 12:30 pm. Pt accepted this. I told her I would put her on the wait list as well. Pt said she had some dry needling and therapy today which has helped a little. She was very appreciative and also wanted me to thank Andee Poles for her.

## 2019-10-16 NOTE — Telephone Encounter (Signed)
Please refer to other phone note. DWD

## 2019-10-16 NOTE — Telephone Encounter (Signed)
Amy has openings frequently, we can watch and see if one opens up.

## 2019-10-22 DIAGNOSIS — Z981 Arthrodesis status: Secondary | ICD-10-CM | POA: Diagnosis not present

## 2019-10-22 DIAGNOSIS — M545 Low back pain: Secondary | ICD-10-CM | POA: Diagnosis not present

## 2019-10-22 DIAGNOSIS — M259 Joint disorder, unspecified: Secondary | ICD-10-CM | POA: Diagnosis not present

## 2019-10-22 DIAGNOSIS — M9904 Segmental and somatic dysfunction of sacral region: Secondary | ICD-10-CM | POA: Diagnosis not present

## 2019-10-22 DIAGNOSIS — M542 Cervicalgia: Secondary | ICD-10-CM | POA: Diagnosis not present

## 2019-10-23 DIAGNOSIS — M6281 Muscle weakness (generalized): Secondary | ICD-10-CM | POA: Diagnosis not present

## 2019-10-23 DIAGNOSIS — M25512 Pain in left shoulder: Secondary | ICD-10-CM | POA: Diagnosis not present

## 2019-10-23 DIAGNOSIS — M7502 Adhesive capsulitis of left shoulder: Secondary | ICD-10-CM | POA: Diagnosis not present

## 2019-10-28 ENCOUNTER — Ambulatory Visit (INDEPENDENT_AMBULATORY_CARE_PROVIDER_SITE_OTHER): Payer: Medicare Other | Admitting: Family Medicine

## 2019-10-28 VITALS — BP 125/87 | Ht 63.0 in | Wt 169.0 lb

## 2019-10-28 DIAGNOSIS — Z0000 Encounter for general adult medical examination without abnormal findings: Secondary | ICD-10-CM

## 2019-10-28 NOTE — Progress Notes (Signed)
Presents today for [employer] Visit   Date of last exam: 09/03/2019  Interpreter used for this visit? No  I connected with  [name] on 10/28/19 by a telephone telemedicine application and verified that I am speaking with the correct person using two identifiers.   I discussed the limitations of evaluation and management by telemedicine. The patient expressed understanding and agreed to proceed.    Patient Care Team: Forrest Moron, MD as PCP - General (Internal Medicine)   Other items to address today:   Discussed Eye/Dental Discussed Immunizations COVID shot for 2nd does pfizer   Scheduled 11-02-2019 Follow up scheduled 6/1 @ 11:00 pm   Other Screening: Last screening for diabetes: 07/24/2019 Last lipid screening: 06/01/2018  ADVANCE DIRECTIVES: Discussed: yes On File: no Materials Provided:yes  Immunization status:  Immunization History  Administered Date(s) Administered  . Influenza Inj Mdck Quad Pf 05/24/2017, 05/09/2018  . Influenza,inj,Quad PF,6+ Mos 04/27/2019     Health Maintenance Due  Topic Date Due  . Hepatitis C Screening  Never done  . PAP SMEAR-Modifier  Never done     Functional Status Survey: Is the patient deaf or have difficulty hearing?: No Does the patient have difficulty seeing, even when wearing glasses/contacts?: No Does the patient have difficulty concentrating, remembering, or making decisions?: No Does the patient have difficulty walking or climbing stairs?: No Does the patient have difficulty dressing or bathing?: No Does the patient have difficulty doing errands alone such as visiting a doctor's office or shopping?: No   6CIT Screen 10/28/2019  What time? 0 points  Count back from 20 0 points  Months in reverse 0 points  Repeat phrase 0 points        Clinical Support from 10/28/2019 in  at [place]  AUDIT-C Score  2       Home Environment:   Lives at home alone Caffeine: 1 or 2  cups a week  No difficulty climbing stairs Lives in one story apartment No scattered rugs No grab bars Adequate lighting/ no clutter   Patient Active Problem List   Diagnosis Date Noted  . Decreased range of knee movement 09/02/2019  . 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 Constipation 09/02/2019  . Calculus, ureter 07/24/2019  . Chronic migraine without aura, with intractable migraine, so stated, with status migrainosus 05/30/2019  . Chronic asthmatic bronchitis ([place]) 05/14/2019  . Excessive daytime sleepiness 01/08/2019  . Upper airway cough syndrome 11/12/2018  . Acute bacterial sinusitis 07/17/2018  . Asthma 07/17/2018  . Abnormal chest CT 07/17/2018  . Trigeminal neuralgia of right side of face 02/01/2018  . Occipital neuralgia 02/01/2018  . Chronic migraine without aura without status migrainosus, not intractable 02/01/2018  . Decreased strength 05/10/2017  . Family history of breast cancer   . Family history of prostate cancer   . Chronic left shoulder pain 09/02/2015  . Carpal tunnel syndrome 08/25/2011  . Essential hypertension 04/14/2009     Past Medical History:  Diagnosis Date  . Asthma   . Chronic pain   . Family history of breast cancer   . Family history of prostate cancer   . GERD (gastroesophageal reflux disease)   . High cholesterol   . IBS (irritable bowel syndrome)   . Migraine   . Occipital neuralgia   . Osteoarthritis   . Spinal stenosis   . Trigeminal neuralgia   . Trigeminal neuralgia      Past Surgical History:  Procedure Laterality  Date  . ABDOMINAL HYSTERECTOMY    . ANKLE SURGERY Left 05/2009  . APPENDECTOMY    . BACK SURGERY    . 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 Pershing Proud at [facility]  . BRAIN SURGERY    . CARPAL TUNNEL RELEASE  2014  . CYSTOSCOPY W/ URETERAL STENT PLACEMENT Left 07/25/2019   Procedure: CYSTOSCOPY WITH RETROGRADE PYELOGRAM/URETERAL STENT PLACEMENT;   Surgeon: Franchot Gallo, MD;  Location: [facility];  Service: Urology;  Laterality: Left;  . CYSTOSCOPY W/ URETERAL STENT REMOVAL Left 08/15/2019   Procedure: CYSTOSCOPY WITH STENT REMOVAL;  Surgeon: Franchot Gallo, MD;  Location: [facility];  Service: Urology;  Laterality: Left;  . CYSTOSCOPY WITH RETROGRADE PYELOGRAM, URETEROSCOPY AND STENT PLACEMENT Left 08/15/2019   Procedure: CYSTOSCOPY WITH RETROGRADE PYELOGRAM, URETEROSCOPY;  Surgeon: Franchot Gallo, MD;  Location: [facility];  Service: Urology;  Laterality: Left;  90 MINS  . CYSTOSCOPY WITH RETROGRADE PYELOGRAM, URETEROSCOPY AND STENT PLACEMENT Right 09/12/2019   Procedure: CYSTOSCOPY WITH RIGHT RETROGRADE PYELOGRAM  URETEROSCOPY WITH HOLMIUM LASER STONE EXTRACTION AND STENT PLACEMENT;  Surgeon: Franchot Gallo, MD;  Location: [facility];  Service: Urology;  Laterality: Right;  . HOLMIUM LASER APPLICATION Left 12/10/5623   Procedure: HOLMIUM LASER APPLICATION;  Surgeon: Franchot Gallo, MD;  Location: [facility];  Service: Urology;  Laterality: Left;  . JOINT REPLACEMENT     R knee  . OVARIAN CYST REMOVAL  05/2010  . right knee revision  2016  . ROTATOR CUFF REPAIR Right 02/2019  . SPINE SURGERY    . TUBAL LIGATION       Family History  Problem Relation Age of Onset  . Ulcers Mother 18       peptic ulcer  . Prostate cancer Father 94  . Breast cancer Sister 51       ER+/PR+/Her2- breast cancer  . Breast cancer Paternal Grandmother   . Diabetes Paternal Grandmother   . Cancer Paternal Uncle        unknown cancers     Social History   Socioeconomic History  . Marital status: Single    Spouse name: Not on file  . Number of children: 1  . Years of education: Not on file  . Highest education level: Not on file  Occupational History  . Not on file  Tobacco Use  . Smoking status: Never Smoker  . Smokeless tobacco: Never Used  Substance and Sexual Activity  . Alcohol use: Yes    Comment: socially  . Drug use: No  . Sexual  activity: Not on file  Other Topics Concern  . Not on file  Social History Narrative  . Not on file   Social Determinants of Health   Financial Resource Strain:   . Difficulty of Paying Living Expenses:   Food Insecurity:   . Worried About Charity fundraiser in the Last Year:   . Arboriculturist in the Last Year:   Transportation Needs:   . Film/video editor (Medical):   Marland Kitchen Lack of Transportation (Non-Medical):   Physical Activity:   . Days of Exercise per Week:   . Minutes of Exercise per Session:   Stress:   . Feeling of Stress :   Social Connections:   . Frequency of Communication with Friends and Family:   . Frequency of Social Gatherings with Friends and Family:   . Attends Religious Services:   . Active Member of Clubs or Organizations:   .  Attends Archivist Meetings:   Marland Kitchen Marital Status:   Intimate Partner Violence:   . Fear of Current or Ex-Partner:   . Emotionally Abused:   Marland Kitchen Physically Abused:   . Sexually Abused:      Allergies  Allergen Reactions  . Toradol [Ketorolac Tromethamine] Shortness Of Breath, Itching and Other (See Comments)    IM administration ONLY  REDNESS  . Aspirin Other (See Comments)    Stomach upset  . Codeine     Cough meds with codeine-have withdraw symptoms when done  . Ibuprofen     Stomach upset  . Latex     Vaginal irritation  . Penicillins Hives    Did it involve swelling of the face/tongue/throat, SOB, or low BP? No Did it involve sudden or severe rash/hives, skin peeling, or any reaction on the inside of your mouth or nose? Yes Did you need to seek medical attention at a hospital or doctor's office? No When did it last happen?2000 If all above answers are "NO", may proceed with cephalosporin use.      Prior to Admission medications   Medication Sig Start Date End Date Taking? Authorizing Provider  albuterol (VENTOLIN HFA) 108 (90 Base) MCG/ACT inhaler Inhale 2 puffs into the lungs every 6 (six)  hours as needed for wheezing or shortness of breath. 05/13/19  Yes Icard, Bradley L, DO  AMITIZA 24 MCG capsule Take 24 mcg by mouth 2 (two) times daily. 07/30/19  Yes [provider]  amitriptyline (ELAVIL) 50 MG tablet Take 1 tablet (50 mg total) by mouth at bedtime. 06/06/19  Yes Lomax, Amy, NP  budesonide-formoterol (SYMBICORT) 80-4.5 MCG/ACT inhaler Inhale 2 puffs into the lungs every 12 (twelve) hours. Patient taking differently: Inhale 2 puffs into the lungs 2 (two) times daily as needed (respiratory issues.).  05/13/19  Yes Icard, Octavio Graves, DO  Calcium Carb-Cholecalciferol (CALCIUM + D3 PO) Take 1 tablet by mouth daily.   Yes [provider]  celecoxib (CELEBREX) 200 MG capsule Take 200 mg by mouth daily.    Yes [provider]  cholecalciferol (VITAMIN D) 25 MCG (1000 UT) tablet Take 1,000 Units by mouth daily.   Yes [provider]  cyanocobalamin 1000 MCG tablet Take 1,000 mcg by mouth daily.   Yes [provider]  dexlansoprazole (DEXILANT) 60 MG capsule Take 60 mg by mouth daily.   Yes [provider]  ELDERBERRY PO Take 5 mLs by mouth daily.    Yes [provider]  fenofibrate (TRICOR) 145 MG tablet TAKE 1 TABLET BY MOUTH EVERY DAY 09/03/19  Yes Stallings, Zoe A, MD  fluticasone (FLONASE) 50 MCG/ACT nasal spray Place 2 sprays into both nostrils daily. Patient taking differently: Place 2 sprays into both nostrils 2 (two) times daily.  05/13/19  Yes Icard, Bradley L, DO  gabapentin (NEURONTIN) 300 MG capsule Take 1-2 capsules (300-600 mg total) by mouth 3 (three) times daily. Patient taking differently: Take 600 mg by mouth 3 (three) times daily.  05/01/19  Yes Lomax, Amy, NP  Melatonin 10 MG TABS Take 10 mg by mouth at bedtime.    Yes [provider]  methocarbamol (ROBAXIN) 500 MG tablet Take 500 mg by mouth at bedtime. Patient is taking half 02/11/19  Yes [provider]  montelukast (SINGULAIR) 10 MG tablet  Take 1 tablet (10 mg total) by mouth at bedtime. 05/13/19  Yes Icard, Bradley L, DO  OIL OF OREGANO PO Take 1 capsule by mouth daily.  Yes [provider]  Probiotic Product (UP4 PROBIOTICS ULTRA) CAPS Take 1 capsule by mouth daily.   Yes [provider]  Simethicone 180 MG CAPS Take 1 capsule (180 mg total) by mouth 2 (two) times daily. Patient taking differently: Take 180 mg by mouth 2 (two) times daily as needed (gas).  06/15/18  Yes Starr Lake, CNM  Spacer/Aero-Holding Chambers (AEROCHAMBER MV) inhaler Use as instructed 04/18/18  Yes Icard, Octavio Graves, DO  tapentadol (NUCYNTA ER) 100 MG 12 hr tablet Take 100 mg by mouth every 12 (twelve) hours.   Yes [provider]  topiramate (TOPAMAX) 100 MG tablet Take 1 tablet (100 mg total) by mouth 2 (two) times daily. 06/06/19  Yes Lomax, Amy, NP  valACYclovir (VALTREX) 500 MG tablet Take 500 mg by mouth 2 (two) times daily as needed (outbreaks).    Yes [provider]  zinc gluconate 50 MG tablet Take 50 mg by mouth daily.   Yes [provider]  acetaminophen (TYLENOL) 325 MG tablet Take 650 mg by mouth daily.    [provider]  Ascorbic Acid (VITAMIN C) 1000 MG tablet Take 1,000 mg by mouth daily.    [provider]  loratadine (CLARITIN) 10 MG tablet Take 10 mg by mouth at bedtime.     [provider]  sulfamethoxazole-trimethoprim (BACTRIM DS) 800-160 MG tablet Take 1 tablet by mouth 2 (two) times daily. 09/12/19   Franchot Gallo, MD     Depression screen [facility] 2/9 10/28/2019 09/03/2019 07/22/2019 05/14/2019  Decreased Interest 0 0 0 0  Down, Depressed, Hopeless 0 0 0 0  PHQ - 2 Score 0 0 0 0     Fall Risk  10/28/2019 09/03/2019 07/22/2019 05/14/2019  Falls in the past year? 0 0 0 0  Number falls in past yr: 0 0 0 0  Injury with Fall? 0 0 0 0  Follow up Falls evaluation completed;Education provided - - Falls evaluation completed      PHYSICAL EXAM: BP  125/87 Comment: taken from a previous visit  Ht '5\' 3"'  (1.6 m)   Wt 169 lb (76.7 kg)   BMI 29.94 kg/m    Wt Readings from Last 3 Encounters:  10/28/19 169 lb (76.7 kg)  09/12/19 169 lb 2 oz (76.7 kg)  09/11/19 169 lb 2 oz (76.7 kg)      Education/Counseling provided regarding diet and exercise, prevention of chronic diseases, smoking/tobacco cessation, if applicable, and reviewed "Covered Medicare Preventive Services."

## 2019-10-28 NOTE — Patient Instructions (Addendum)
Thank you for taking time to come for your Medicare Wellness Visit. I appreciate your ongoing commitment to your health goals. Please review the following plan we discussed and let me know if I can assist you in the future.  Harvard Zeiss LPN  Preventive Care 40-58 Years Old, Female Preventive care refers to visits with your health care provider and lifestyle choices that can promote health and wellness. This includes:  A yearly physical exam. This may also be called an annual well check.  Regular dental visits and eye exams.  Immunizations.  Screening for certain conditions.  Healthy lifestyle choices, such as eating a healthy diet, getting regular exercise, not using drugs or products that contain nicotine and tobacco, and limiting alcohol use. What can I expect for my preventive care visit? Physical exam Your health care provider will check your:  Height and weight. This may be used to calculate body mass index (BMI), which tells if you are at a healthy weight.  Heart rate and blood pressure.  Skin for abnormal spots. Counseling Your health care provider may ask you questions about your:  Alcohol, tobacco, and drug use.  Emotional well-being.  Home and relationship well-being.  Sexual activity.  Eating habits.  Work and work environment.  Method of birth control.  Menstrual cycle.  Pregnancy history. What immunizations do I need?  Influenza (flu) vaccine  This is recommended every year. Tetanus, diphtheria, and pertussis (Tdap) vaccine  You may need a Td booster every 10 years. Varicella (chickenpox) vaccine  You may need this if you have not been vaccinated. Zoster (shingles) vaccine  You may need this after age 60. Measles, mumps, and rubella (MMR) vaccine  You may need at least one dose of MMR if you were born in 1957 or later. You may also need a second dose. Pneumococcal conjugate (PCV13) vaccine  You may need this if you have certain conditions  and were not previously vaccinated. Pneumococcal polysaccharide (PPSV23) vaccine  You may need one or two doses if you smoke cigarettes or if you have certain conditions. Meningococcal conjugate (MenACWY) vaccine  You may need this if you have certain conditions. Hepatitis A vaccine  You may need this if you have certain conditions or if you travel or work in places where you may be exposed to hepatitis A. Hepatitis B vaccine  You may need this if you have certain conditions or if you travel or work in places where you may be exposed to hepatitis B. Haemophilus influenzae type b (Hib) vaccine  You may need this if you have certain conditions. Human papillomavirus (HPV) vaccine  If recommended by your health care provider, you may need three doses over 6 months. You may receive vaccines as individual doses or as more than one vaccine together in one shot (combination vaccines). Talk with your health care provider about the risks and benefits of combination vaccines. What tests do I need? Blood tests  Lipid and cholesterol levels. These may be checked every 5 years, or more frequently if you are over 50 years old.  Hepatitis C test.  Hepatitis B test. Screening  Lung cancer screening. You may have this screening every year starting at age 55 if you have a 30-pack-year history of smoking and currently smoke or have quit within the past 15 years.  Colorectal cancer screening. All adults should have this screening starting at age 50 and continuing until age 75. Your health care provider may recommend screening at age 45 if you are   at increased risk. You will have tests every 1-10 years, depending on your results and the type of screening test.  Diabetes screening. This is done by checking your blood sugar (glucose) after you have not eaten for a while (fasting). You may have this done every 1-3 years.  Mammogram. This may be done every 1-2 years. Talk with your health care provider  about when you should start having regular mammograms. This may depend on whether you have a family history of breast cancer.  BRCA-related cancer screening. This may be done if you have a family history of breast, ovarian, tubal, or peritoneal cancers.  Pelvic exam and Pap test. This may be done every 3 years starting at age 21. Starting at age 30, this may be done every 5 years if you have a Pap test in combination with an HPV test. Other tests  Sexually transmitted disease (STD) testing.  Bone density scan. This is done to screen for osteoporosis. You may have this scan if you are at high risk for osteoporosis. Follow these instructions at home: Eating and drinking  Eat a diet that includes fresh fruits and vegetables, whole grains, lean protein, and low-fat dairy.  Take vitamin and mineral supplements as recommended by your health care provider.  Do not drink alcohol if: ? Your health care provider tells you not to drink. ? You are pregnant, may be pregnant, or are planning to become pregnant.  If you drink alcohol: ? Limit how much you have to 0-1 drink a day. ? Be aware of how much alcohol is in your drink. In the [place] one drink equals one 12 oz bottle of beer (355 mL), one 5 oz glass of wine (148 mL), or one 1 oz glass of hard liquor (44 mL). Lifestyle  Take daily care of your teeth and gums.  Stay active. Exercise for at least 30 minutes on 5 or more days each week.  Do not use any products that contain nicotine or tobacco, such as cigarettes, e-cigarettes, and chewing tobacco. If you need help quitting, ask your health care provider.  If you are sexually active, practice safe sex. Use a condom or other form of birth control (contraception) in order to prevent pregnancy and STIs (sexually transmitted infections).  If told by your health care provider, take low-dose aspirin daily starting at age 50. What's next?  Visit your health care provider once a year for a well  check visit.  Ask your health care provider how often you should have your eyes and teeth checked.  Stay up to date on all vaccines. This information is not intended to replace advice given to you by your health care provider. Make sure you discuss any questions you have with your health care provider. Document Revised: 04/05/2018 Document Reviewed: 04/05/2018 Elsevier Patient Education  2020 Elsevier Inc.  

## 2019-10-29 DIAGNOSIS — N200 Calculus of kidney: Secondary | ICD-10-CM | POA: Diagnosis not present

## 2019-10-30 ENCOUNTER — Ambulatory Visit
Admission: RE | Admit: 2019-10-30 | Discharge: 2019-10-30 | Disposition: A | Discharge status: Home / Self Care | Payer: Medicare Other | Source: Ambulatory Visit | Attending: Pain Medicine | Admitting: Pain Medicine

## 2019-10-30 ENCOUNTER — Other Ambulatory Visit: Payer: Self-pay | Admitting: Pain Medicine

## 2019-10-30 ENCOUNTER — Other Ambulatory Visit: Payer: Self-pay

## 2019-10-30 DIAGNOSIS — M542 Cervicalgia: Secondary | ICD-10-CM | POA: Diagnosis not present

## 2019-10-30 DIAGNOSIS — Z79891 Long term (current) use of opiate analgesic: Secondary | ICD-10-CM | POA: Diagnosis not present

## 2019-10-30 DIAGNOSIS — M7502 Adhesive capsulitis of left shoulder: Secondary | ICD-10-CM | POA: Diagnosis not present

## 2019-10-30 DIAGNOSIS — M7918 Myalgia, other site: Secondary | ICD-10-CM | POA: Diagnosis not present

## 2019-10-30 DIAGNOSIS — M25512 Pain in left shoulder: Secondary | ICD-10-CM | POA: Diagnosis not present

## 2019-10-30 DIAGNOSIS — G894 Chronic pain syndrome: Secondary | ICD-10-CM | POA: Diagnosis not present

## 2019-10-30 DIAGNOSIS — Z79899 Other long term (current) drug therapy: Secondary | ICD-10-CM | POA: Diagnosis not present

## 2019-10-30 DIAGNOSIS — M6281 Muscle weakness (generalized): Secondary | ICD-10-CM | POA: Diagnosis not present

## 2019-11-04 DIAGNOSIS — M5022 Other cervical disc displacement, mid-cervical region, unspecified level: Secondary | ICD-10-CM | POA: Diagnosis not present

## 2019-11-04 DIAGNOSIS — M5126 Other intervertebral disc displacement, lumbar region: Secondary | ICD-10-CM | POA: Diagnosis not present

## 2019-11-04 DIAGNOSIS — M9902 Segmental and somatic dysfunction of thoracic region: Secondary | ICD-10-CM | POA: Diagnosis not present

## 2019-11-04 DIAGNOSIS — F4325 Adjustment disorder with mixed disturbance of emotions and conduct: Secondary | ICD-10-CM | POA: Diagnosis not present

## 2019-11-04 DIAGNOSIS — M9903 Segmental and somatic dysfunction of lumbar region: Secondary | ICD-10-CM | POA: Diagnosis not present

## 2019-11-04 DIAGNOSIS — M5124 Other intervertebral disc displacement, thoracic region: Secondary | ICD-10-CM | POA: Diagnosis not present

## 2019-11-04 DIAGNOSIS — M9901 Segmental and somatic dysfunction of cervical region: Secondary | ICD-10-CM | POA: Diagnosis not present

## 2019-11-06 ENCOUNTER — Ambulatory Visit (INDEPENDENT_AMBULATORY_CARE_PROVIDER_SITE_OTHER): Payer: Medicare Other | Admitting: Family Medicine

## 2019-11-06 ENCOUNTER — Encounter: Payer: Self-pay | Admitting: Family Medicine

## 2019-11-06 ENCOUNTER — Other Ambulatory Visit: Payer: Self-pay

## 2019-11-06 DIAGNOSIS — M25512 Pain in left shoulder: Secondary | ICD-10-CM | POA: Diagnosis not present

## 2019-11-06 DIAGNOSIS — M9901 Segmental and somatic dysfunction of cervical region: Secondary | ICD-10-CM | POA: Diagnosis not present

## 2019-11-06 DIAGNOSIS — M5022 Other cervical disc displacement, mid-cervical region, unspecified level: Secondary | ICD-10-CM | POA: Diagnosis not present

## 2019-11-06 DIAGNOSIS — M6281 Muscle weakness (generalized): Secondary | ICD-10-CM | POA: Diagnosis not present

## 2019-11-06 DIAGNOSIS — M9902 Segmental and somatic dysfunction of thoracic region: Secondary | ICD-10-CM | POA: Diagnosis not present

## 2019-11-06 DIAGNOSIS — M5124 Other intervertebral disc displacement, thoracic region: Secondary | ICD-10-CM | POA: Diagnosis not present

## 2019-11-06 DIAGNOSIS — M5126 Other intervertebral disc displacement, lumbar region: Secondary | ICD-10-CM | POA: Diagnosis not present

## 2019-11-06 DIAGNOSIS — M7502 Adhesive capsulitis of left shoulder: Secondary | ICD-10-CM | POA: Diagnosis not present

## 2019-11-06 DIAGNOSIS — G43711 Chronic migraine without aura, intractable, with status migrainosus: Secondary | ICD-10-CM | POA: Diagnosis not present

## 2019-11-06 DIAGNOSIS — M9903 Segmental and somatic dysfunction of lumbar region: Secondary | ICD-10-CM | POA: Diagnosis not present

## 2019-11-06 NOTE — Progress Notes (Addendum)
Consent Form Botulism Toxin Injection For Chronic Migraine  She is doing well. Headaches significantly improved after last Botox procedure in 05/2019. Unfortunately, she has not been able to get back in for second dose until now. Headaches have worsened and are just as frequent as before. She also notes having multiple kidney stones since the first of the year. Pathology reveals calcium oxalate stones. She is taking topiramate 100mg  twice daily. We will wean this medication. She will reduce dose by 50% every two weeks until she is off medication. She will increase gabapentin to 600mg  three times daily and continue amitriptyline 50mg  at bedtime. We will repeat Botox in 12 weeks.   Reviewed orally with patient, additionally signature is on file:  Botulism toxin has been approved by the Federal drug administration for treatment of chronic migraine. Botulism toxin does not cure chronic migraine and it may not be effective in some patients.  The administration of botulism toxin is accomplished by injecting a small amount of toxin into the muscles of the neck and head. Dosage must be titrated for each individual. Any benefits resulting from botulism toxin tend to wear off after 3 months with a repeat injection required if benefit is to be maintained. Injections are usually done every 3-4 months with maximum effect peak achieved by about 2 or 3 weeks. Botulism toxin is expensive and you should be sure of what costs you will incur resulting from the injection.  The side effects of botulism toxin use for chronic migraine may include:   -Transient, and usually mild, facial weakness with facial injections  -Transient, and usually mild, head or neck weakness with head/neck injections  -Reduction or loss of forehead facial animation due to forehead muscle weakness  -Eyelid drooping  -Dry eye  -Pain at the site of injection or bruising at the site of injection  -Double vision  -Potential unknown long term  risks   Contraindications: You should not have Botox if you are pregnant, nursing, allergic to albumin, have an infection, skin condition, or muscle weakness at the site of the injection, or have myasthenia gravis, Lambert-Eaton syndrome, or ALS.  It is also possible that as with any injection, there may be an allergic reaction or no effect from the medication. Reduced effectiveness after repeated injections is sometimes seen and rarely infection at the injection site may occur. All care will be taken to prevent these side effects. If therapy is given over a long time, atrophy and wasting in the muscle injected may occur. Occasionally the patient's become refractory to treatment because they develop antibodies to the toxin. In this event, therapy needs to be modified.  I have read the above information and consent to the administration of botulism toxin.    BOTOX PROCEDURE NOTE FOR MIGRAINE HEADACHE  Contraindications and precautions discussed with patient(above). Aseptic procedure was observed and patient tolerated procedure. Procedure performed by Debbora Presto, FNP-C.   The condition has existed for more than 6 months, and pt does not have a diagnosis of ALS, Myasthenia Gravis or Lambert-Eaton Syndrome.  Risks and benefits of injections discussed and pt agrees to proceed with the procedure.  Written consent obtained  These injections are medically necessary. Pt  receives good benefits from these injections. These injections do not cause sedations or hallucinations which the oral therapies may cause.   Description of procedure:  The patient was placed in a sitting position. The standard protocol was used for Botox as follows, with 5 units of Botox injected at  each site:  -Procerus muscle, midline injection  -Corrugator muscle, bilateral injection  -Frontalis muscle, bilateral injection, with 2 sites each side, medial injection was performed in the upper one third of the frontalis muscle, in  the region vertical from the medial inferior edge of the superior orbital rim. The lateral injection was again in the upper one third of the forehead vertically above the lateral limbus of the cornea, 1.5 cm lateral to the medial injection site.  -Temporalis muscle injection, 4 sites, bilaterally. The first injection was 3 cm above the tragus of the ear, second injection site was 1.5 cm to 3 cm up from the first injection site in line with the tragus of the ear. The third injection site was 1.5-3 cm forward between the first 2 injection sites. The fourth injection site was 1.5 cm posterior to the second injection site. 5th site laterally in the temporalis  muscleat the level of the outer canthus.  -Occipitalis muscle injection, 3 sites, bilaterally. The first injection was done one half way between the occipital protuberance and the tip of the mastoid process behind the ear. The second injection site was done lateral and superior to the first, 1 fingerbreadth from the first injection. The third injection site was 1 fingerbreadth superiorly and medially from the first injection site.  -Cervical paraspinal muscle injection, 2 sites, bilaterally. The first injection site was 1 cm from the midline of the cervical spine, 3 cm inferior to the lower border of the occipital protuberance. The second injection site was 1.5 cm superiorly and laterally to the first injection site.  -Trapezius muscle injection was performed at 3 sites, bilaterally. The first injection site was in the upper trapezius muscle halfway between the inflection point of the neck, and the acromion. The second injection site was one half way between the acromion and the first injection site. The third injection was done between the first injection site and the inflection point of the neck.   Will return for repeat injection in 3 months.   A total of 200 units of Botox was prepared, 155 units of Botox was injected as documented above, any  Botox not injected was wasted. The patient tolerated the procedure well, there were no complications of the above procedure.  Agree with procedure, Sarina Ill MD

## 2019-11-06 NOTE — Progress Notes (Signed)
BOTOX 100units BB LOT [id number] x 2 EXP 07/2022 x 2 NDC 0023-1145-01 X2  NS NDC [id number] LOT [id number] EXP 02-06-2120

## 2019-11-07 ENCOUNTER — Telehealth: Payer: Self-pay | Admitting: Family Medicine

## 2019-11-07 MED ORDER — TOPIRAMATE 25 MG PO TABS: ORAL_TABLET | ORAL | 0 refills | Status: DC

## 2019-11-07 NOTE — Telephone Encounter (Signed)
Patient called to check on the status of her new prescription for her new dose of topiramate 25mg  states her current pills are not "cutable" and she cannot cut them accurately

## 2019-11-07 NOTE — Telephone Encounter (Signed)
I spoke to pt and she relayed the tapering of topamax but is needing 25mg  tabs to do , as she cannot cut the 100mg  tablets as they crumble and are not scored.  Prescribed with 25mg  tabs to pharmacy. Pt aware.

## 2019-11-11 DIAGNOSIS — M5126 Other intervertebral disc displacement, lumbar region: Secondary | ICD-10-CM | POA: Diagnosis not present

## 2019-11-11 DIAGNOSIS — M9903 Segmental and somatic dysfunction of lumbar region: Secondary | ICD-10-CM | POA: Diagnosis not present

## 2019-11-11 DIAGNOSIS — M9902 Segmental and somatic dysfunction of thoracic region: Secondary | ICD-10-CM | POA: Diagnosis not present

## 2019-11-11 DIAGNOSIS — M5124 Other intervertebral disc displacement, thoracic region: Secondary | ICD-10-CM | POA: Diagnosis not present

## 2019-11-11 DIAGNOSIS — M5022 Other cervical disc displacement, mid-cervical region, unspecified level: Secondary | ICD-10-CM | POA: Diagnosis not present

## 2019-11-11 DIAGNOSIS — M9901 Segmental and somatic dysfunction of cervical region: Secondary | ICD-10-CM | POA: Diagnosis not present

## 2019-11-13 DIAGNOSIS — M9901 Segmental and somatic dysfunction of cervical region: Secondary | ICD-10-CM | POA: Diagnosis not present

## 2019-11-13 DIAGNOSIS — M9903 Segmental and somatic dysfunction of lumbar region: Secondary | ICD-10-CM | POA: Diagnosis not present

## 2019-11-13 DIAGNOSIS — M9902 Segmental and somatic dysfunction of thoracic region: Secondary | ICD-10-CM | POA: Diagnosis not present

## 2019-11-13 DIAGNOSIS — M5124 Other intervertebral disc displacement, thoracic region: Secondary | ICD-10-CM | POA: Diagnosis not present

## 2019-11-13 DIAGNOSIS — M5126 Other intervertebral disc displacement, lumbar region: Secondary | ICD-10-CM | POA: Diagnosis not present

## 2019-11-13 DIAGNOSIS — M5022 Other cervical disc displacement, mid-cervical region, unspecified level: Secondary | ICD-10-CM | POA: Diagnosis not present

## 2019-11-15 ENCOUNTER — Other Ambulatory Visit: Payer: Self-pay | Admitting: Family Medicine

## 2019-11-15 MED ORDER — AMITIZA 24 MCG PO CAPS: 24.0000 ug | ORAL_CAPSULE | Freq: Two times a day (BID) | ORAL | 2 refills | Status: DC

## 2019-11-15 NOTE — Telephone Encounter (Signed)
Requested medication (s) are due for refill today - yes  Requested medication (s) are on the active medication list -yes  Future visit scheduled -yes  Last refill: 07/30/19  Notes to clinic: request for historical medication- sent for review  Requested Prescriptions  Pending Prescriptions Disp Refills   AMITIZA 24 MCG capsule       Sig: Take 1 capsule (24 mcg total) by mouth 2 (two) times daily.      Gastroenterology: Irritable Bowel Syndrome Passed - 11/15/2019  1:49 PM      Passed - Valid encounter within last 12 months    Recent Outpatient Visits           2 weeks ago Medicare annual wellness visit, subsequent   Primary Care at [facility], [place] A, MD   2 months ago Acute renal insufficiency   Primary Care at [facility], Arlie Solomons, MD   3 months ago Flank pain, acute   Primary Care at [facility], Lilia Argue, MD   4 months ago Degenerative disc disease, lumbar   Primary Care at [facility], Arlie Solomons, MD   6 months ago Gallstones   Primary Care at [place], MD       Future Appointments             In 1 month Forrest Moron, MD Primary Care at [place], [facility]                Requested Prescriptions  Pending Prescriptions Disp Refills   AMITIZA 24 MCG capsule       Sig: Take 1 capsule (24 mcg total) by mouth 2 (two) times daily.      Gastroenterology: Irritable Bowel Syndrome Passed - 11/15/2019  1:49 PM      Passed - Valid encounter within last 12 months    Recent Outpatient Visits           2 weeks ago Medicare annual wellness visit, subsequent   Primary Care at [facility], [place] A, MD   2 months ago Acute renal insufficiency   Primary Care at [facility], Arlie Solomons, MD   3 months ago Flank pain, acute   Primary Care at [facility], Lilia Argue, MD   4 months ago Degenerative disc disease, lumbar   Primary Care at [facility], Arlie Solomons, MD   6 months ago Gallstones   Primary Care at [place], MD       Future  Appointments             In 1 month Forrest Moron, MD Primary Care at [place], [facility]

## 2019-11-15 NOTE — Telephone Encounter (Signed)
AMITIZA 24 MCG capsule     Patient requesting refill.    Pharmacy:  [id number] - [place], [place] Phone:  [phone]  Fax:  [phone]

## 2019-11-18 ENCOUNTER — Other Ambulatory Visit: Payer: Self-pay | Admitting: Family Medicine

## 2019-11-18 ENCOUNTER — Telehealth: Payer: Self-pay | Admitting: Family Medicine

## 2019-11-18 DIAGNOSIS — M7502 Adhesive capsulitis of left shoulder: Secondary | ICD-10-CM | POA: Diagnosis not present

## 2019-11-18 DIAGNOSIS — M5126 Other intervertebral disc displacement, lumbar region: Secondary | ICD-10-CM | POA: Diagnosis not present

## 2019-11-18 DIAGNOSIS — K5904 Chronic idiopathic constipation: Secondary | ICD-10-CM

## 2019-11-18 DIAGNOSIS — M6281 Muscle weakness (generalized): Secondary | ICD-10-CM | POA: Diagnosis not present

## 2019-11-18 DIAGNOSIS — M9903 Segmental and somatic dysfunction of lumbar region: Secondary | ICD-10-CM | POA: Diagnosis not present

## 2019-11-18 DIAGNOSIS — M5124 Other intervertebral disc displacement, thoracic region: Secondary | ICD-10-CM | POA: Diagnosis not present

## 2019-11-18 DIAGNOSIS — M25512 Pain in left shoulder: Secondary | ICD-10-CM | POA: Diagnosis not present

## 2019-11-18 DIAGNOSIS — M9902 Segmental and somatic dysfunction of thoracic region: Secondary | ICD-10-CM | POA: Diagnosis not present

## 2019-11-18 DIAGNOSIS — M9901 Segmental and somatic dysfunction of cervical region: Secondary | ICD-10-CM | POA: Diagnosis not present

## 2019-11-18 DIAGNOSIS — M5022 Other cervical disc displacement, mid-cervical region, unspecified level: Secondary | ICD-10-CM | POA: Diagnosis not present

## 2019-11-18 NOTE — Telephone Encounter (Signed)
Copied from [place] [phone]. Topic: Quick Communication - Rx Refill/Question >> Nov 18, 2019  9:55 AM Rainey Pines A wrote: Medication: Lubiprostone GENERIC (Patient stated that her medication was called in to pharmacy incorrectly. Patient is requesting the generic version of medication be called in.)  Has the patient contacted their pharmacy? {Yes (Agent: If no, request that the patient contact the pharmacy for the refill.) (Agent: If yes, when and what did the pharmacy advise?)Contact PCP  Preferred Pharmacy (with phone number or street name): [id number] - [place], [place]  Phone:  [phone] Fax:  [phone]     Agent: Please be advised that RX refills may take up to 3 business days. We ask that you follow-up with your pharmacy.

## 2019-11-18 NOTE — Telephone Encounter (Signed)
Patient has called in for a medication refill for the generic brand of AMITIZA 24 MCG capsule [id number]   called Lubiprostone GENERIC . Pt states refill was called incorrectly and will need her prescription filled. Please advise

## 2019-11-18 NOTE — Telephone Encounter (Signed)
Please see note regarding pt wanting generic version of med. Routing to [employer].

## 2019-11-19 MED ORDER — LUBIPROSTONE 24 MCG PO CAPS: 24.0000 ug | ORAL_CAPSULE | Freq: Two times a day (BID) | ORAL | 2 refills | Status: DC

## 2019-11-19 NOTE — Telephone Encounter (Signed)
Spoke with pharmacist at [facility] and cancelled brand amitiza and called in and resent lubiprostone 24  Mg #60 with 2 refills.

## 2019-11-21 ENCOUNTER — Ambulatory Visit: Payer: Medicare Other | Admitting: Gastroenterology

## 2019-11-21 DIAGNOSIS — N202 Calculus of kidney with calculus of ureter: Secondary | ICD-10-CM | POA: Diagnosis not present

## 2019-11-25 DIAGNOSIS — M5126 Other intervertebral disc displacement, lumbar region: Secondary | ICD-10-CM | POA: Diagnosis not present

## 2019-11-25 DIAGNOSIS — M9903 Segmental and somatic dysfunction of lumbar region: Secondary | ICD-10-CM | POA: Diagnosis not present

## 2019-11-25 DIAGNOSIS — M5022 Other cervical disc displacement, mid-cervical region, unspecified level: Secondary | ICD-10-CM | POA: Diagnosis not present

## 2019-11-25 DIAGNOSIS — M9901 Segmental and somatic dysfunction of cervical region: Secondary | ICD-10-CM | POA: Diagnosis not present

## 2019-11-25 DIAGNOSIS — M5124 Other intervertebral disc displacement, thoracic region: Secondary | ICD-10-CM | POA: Diagnosis not present

## 2019-11-25 DIAGNOSIS — M9902 Segmental and somatic dysfunction of thoracic region: Secondary | ICD-10-CM | POA: Diagnosis not present

## 2019-11-27 DIAGNOSIS — F4325 Adjustment disorder with mixed disturbance of emotions and conduct: Secondary | ICD-10-CM | POA: Diagnosis not present

## 2019-11-27 DIAGNOSIS — M9901 Segmental and somatic dysfunction of cervical region: Secondary | ICD-10-CM | POA: Diagnosis not present

## 2019-11-27 DIAGNOSIS — M5126 Other intervertebral disc displacement, lumbar region: Secondary | ICD-10-CM | POA: Diagnosis not present

## 2019-11-27 DIAGNOSIS — M5022 Other cervical disc displacement, mid-cervical region, unspecified level: Secondary | ICD-10-CM | POA: Diagnosis not present

## 2019-11-27 DIAGNOSIS — M5124 Other intervertebral disc displacement, thoracic region: Secondary | ICD-10-CM | POA: Diagnosis not present

## 2019-11-27 DIAGNOSIS — M9903 Segmental and somatic dysfunction of lumbar region: Secondary | ICD-10-CM | POA: Diagnosis not present

## 2019-11-27 DIAGNOSIS — M9902 Segmental and somatic dysfunction of thoracic region: Secondary | ICD-10-CM | POA: Diagnosis not present

## 2019-12-02 DIAGNOSIS — M9903 Segmental and somatic dysfunction of lumbar region: Secondary | ICD-10-CM | POA: Diagnosis not present

## 2019-12-02 DIAGNOSIS — M5022 Other cervical disc displacement, mid-cervical region, unspecified level: Secondary | ICD-10-CM | POA: Diagnosis not present

## 2019-12-02 DIAGNOSIS — M5124 Other intervertebral disc displacement, thoracic region: Secondary | ICD-10-CM | POA: Diagnosis not present

## 2019-12-02 DIAGNOSIS — M9902 Segmental and somatic dysfunction of thoracic region: Secondary | ICD-10-CM | POA: Diagnosis not present

## 2019-12-02 DIAGNOSIS — M9901 Segmental and somatic dysfunction of cervical region: Secondary | ICD-10-CM | POA: Diagnosis not present

## 2019-12-02 DIAGNOSIS — M5126 Other intervertebral disc displacement, lumbar region: Secondary | ICD-10-CM | POA: Diagnosis not present

## 2019-12-09 ENCOUNTER — Ambulatory Visit (INDEPENDENT_AMBULATORY_CARE_PROVIDER_SITE_OTHER): Payer: Medicare Other | Admitting: Family Medicine

## 2019-12-09 ENCOUNTER — Other Ambulatory Visit: Payer: Self-pay

## 2019-12-09 ENCOUNTER — Encounter: Payer: Self-pay | Admitting: Family Medicine

## 2019-12-09 VITALS — BP 150/90 | HR 98 | Temp 98.0°F | Wt 163.6 lb

## 2019-12-09 DIAGNOSIS — I10 Essential (primary) hypertension: Secondary | ICD-10-CM

## 2019-12-09 DIAGNOSIS — Z1159 Encounter for screening for other viral diseases: Secondary | ICD-10-CM

## 2019-12-09 DIAGNOSIS — B9689 Other specified bacterial agents as the cause of diseases classified elsewhere: Secondary | ICD-10-CM

## 2019-12-09 DIAGNOSIS — E7800 Pure hypercholesterolemia, unspecified: Secondary | ICD-10-CM

## 2019-12-09 DIAGNOSIS — G894 Chronic pain syndrome: Secondary | ICD-10-CM | POA: Diagnosis not present

## 2019-12-09 DIAGNOSIS — M47812 Spondylosis without myelopathy or radiculopathy, cervical region: Secondary | ICD-10-CM | POA: Diagnosis not present

## 2019-12-09 DIAGNOSIS — M25561 Pain in right knee: Secondary | ICD-10-CM | POA: Diagnosis not present

## 2019-12-09 DIAGNOSIS — M9904 Segmental and somatic dysfunction of sacral region: Secondary | ICD-10-CM | POA: Diagnosis not present

## 2019-12-09 DIAGNOSIS — M5136 Other intervertebral disc degeneration, lumbar region: Secondary | ICD-10-CM | POA: Diagnosis not present

## 2019-12-09 MED ORDER — FENOFIBRATE 145 MG PO TABS: 145.0000 mg | ORAL_TABLET | Freq: Every day | ORAL | 1 refills | Status: DC

## 2019-12-09 NOTE — Progress Notes (Signed)
Established Patient Office Visit  Subjective:  Patient ID: Cassidy Olsen, female    DOB: 1961/06/04  Age: 59 y.o. MRN: [id number]  CC:  Chief Complaint  Patient presents with  . Follow-up    F/u patient stated her bp has been running high for 4 month now. . Want to discuss urology visit and neurology visit. Neuro stated topirate was the cause of my kidney stone. Urology stated my citrat was low and oxilate was high    HPI Cassidy Olsen presents for   Hypertension Patient reports that since March she has been having higher blood pressure readings. She states that she lost 50 pounds and her bp improved so she has come off the bp meds since 2015. She was on lisinopril from 2009 to 2015.  She states that she has been meloxicam 2 months ago  BP Readings from Last 3 Encounters:  12/09/19 (!) 150/90  10/28/19 125/87  09/12/19 136/88   Trigeminal Neuralgia/occipital neuralgia Patient is on crystal light pouches to acidify the urine She states that she was having repeated kidney stones but her neurologist realized that it was most likely due to her topiramate She states that she is now getting botox which is helping her trigeminal and occipital neuralgia She is also on elavil  She reports that she sticks to a low fat diet and is on fenofibrate. She would like her cholesterol levels checked.      Past Medical History:  Diagnosis Date  . Asthma   . Chronic pain   . Family history of breast cancer   . Family history of prostate cancer   . GERD (gastroesophageal reflux disease)   . High cholesterol   . IBS (irritable bowel syndrome)   . Migraine   . Occipital neuralgia   . Osteoarthritis   . Spinal stenosis   . Trigeminal neuralgia   . Trigeminal neuralgia     Past Surgical History:  Procedure Laterality Date  . ABDOMINAL HYSTERECTOMY    . ANKLE SURGERY Left 05/2009  . APPENDECTOMY    . BACK SURGERY    . Benign Tumor Removal Right 01/26/2017   right upper arm by  Dr. Pershing Proud at [facility]  . BRAIN SURGERY    . CARPAL TUNNEL RELEASE  2014  . CYSTOSCOPY W/ URETERAL STENT PLACEMENT Left 07/25/2019   Procedure: CYSTOSCOPY WITH RETROGRADE PYELOGRAM/URETERAL STENT PLACEMENT;  Surgeon: Franchot Gallo, MD;  Location: [facility];  Service: Urology;  Laterality: Left;  . CYSTOSCOPY W/ URETERAL STENT REMOVAL Left 08/15/2019   Procedure: CYSTOSCOPY WITH STENT REMOVAL;  Surgeon: Franchot Gallo, MD;  Location: [facility];  Service: Urology;  Laterality: Left;  . CYSTOSCOPY WITH RETROGRADE PYELOGRAM, URETEROSCOPY AND STENT PLACEMENT Left 08/15/2019   Procedure: CYSTOSCOPY WITH RETROGRADE PYELOGRAM, URETEROSCOPY;  Surgeon: Franchot Gallo, MD;  Location: [facility];  Service: Urology;  Laterality: Left;  90 MINS  . CYSTOSCOPY WITH RETROGRADE PYELOGRAM, URETEROSCOPY AND STENT PLACEMENT Right 09/12/2019   Procedure: CYSTOSCOPY WITH RIGHT RETROGRADE PYELOGRAM  URETEROSCOPY WITH HOLMIUM LASER STONE EXTRACTION AND STENT PLACEMENT;  Surgeon: Franchot Gallo, MD;  Location: [facility];  Service: Urology;  Laterality: Right;  . HOLMIUM LASER APPLICATION Left 01/11/6811   Procedure: HOLMIUM LASER APPLICATION;  Surgeon: Franchot Gallo, MD;  Location: [facility];  Service: Urology;  Laterality: Left;  . JOINT REPLACEMENT     R knee  . OVARIAN CYST REMOVAL  05/2010  . right knee revision  2016  . ROTATOR CUFF REPAIR Right 02/2019  .  SPINE SURGERY    . TUBAL LIGATION      Family History  Problem Relation Age of Onset  . Ulcers Mother 68       peptic ulcer  . Prostate cancer Father 93  . Breast cancer Sister 69       ER+/PR+/Her2- breast cancer  . Breast cancer Paternal Grandmother   . Diabetes Paternal Grandmother   . Cancer Paternal Uncle        unknown cancers    Social History   Socioeconomic History  . Marital status: Single    Spouse name: Not on file  . Number of children: 1  . Years of education: Not on file  . Highest education level: Not on file   Occupational History  . Not on file  Tobacco Use  . Smoking status: Never Smoker  . Smokeless tobacco: Never Used  Substance and Sexual Activity  . Alcohol use: Yes    Comment: socially  . Drug use: No  . Sexual activity: Not on file  Other Topics Concern  . Not on file  Social History Narrative  . Not on file   Social Determinants of Health   Financial Resource Strain:   . Difficulty of Paying Living Expenses:   Food Insecurity:   . Worried About Charity fundraiser in the Last Year:   . Arboriculturist in the Last Year:   Transportation Needs:   . Film/video editor (Medical):   Marland Kitchen Lack of Transportation (Non-Medical):   Physical Activity:   . Days of Exercise per Week:   . Minutes of Exercise per Session:   Stress:   . Feeling of Stress :   Social Connections:   . Frequency of Communication with Friends and Family:   . Frequency of Social Gatherings with Friends and Family:   . Attends Religious Services:   . Active Member of Clubs or Organizations:   . Attends Archivist Meetings:   Marland Kitchen Marital Status:   Intimate Partner Violence:   . Fear of Current or Ex-Partner:   . Emotionally Abused:   Marland Kitchen Physically Abused:   . Sexually Abused:     Outpatient Medications Prior to Visit  Medication Sig Dispense Refill  . acetaminophen (TYLENOL) 325 MG tablet Take 650 mg by mouth daily.    Marland Kitchen albuterol (VENTOLIN HFA) 108 (90 Base) MCG/ACT inhaler Inhale 2 puffs into the lungs every 6 (six) hours as needed for wheezing or shortness of breath. 18 g 11  . AMITIZA 24 MCG capsule Take 1 capsule (24 mcg total) by mouth 2 (two) times daily. 60 capsule 2  . amitriptyline (ELAVIL) 50 MG tablet Take 1 tablet (50 mg total) by mouth at bedtime. 90 tablet 3  . Ascorbic Acid (VITAMIN C) 1000 MG tablet Take 1,000 mg by mouth daily.    . budesonide-formoterol (SYMBICORT) 80-4.5 MCG/ACT inhaler Inhale 2 puffs into the lungs every 12 (twelve) hours. (Patient taking differently:  Inhale 2 puffs into the lungs 2 (two) times daily as needed (respiratory issues.). ) 1 Inhaler 5  . Calcium Carb-Cholecalciferol (CALCIUM + D3 PO) Take 1 tablet by mouth daily.    . celecoxib (CELEBREX) 200 MG capsule Take 200 mg by mouth daily.     . cholecalciferol (VITAMIN D) 25 MCG (1000 UT) tablet Take 1,000 Units by mouth daily.    . cyanocobalamin 1000 MCG tablet Take 1,000 mcg by mouth daily.    Marland Kitchen dexlansoprazole (DEXILANT) 60 MG capsule Take  60 mg by mouth daily.    Marland Kitchen ELDERBERRY PO Take 5 mLs by mouth daily.     . fluticasone (FLONASE) 50 MCG/ACT nasal spray Place 2 sprays into both nostrils daily. (Patient taking differently: Place 2 sprays into both nostrils 2 (two) times daily. ) 16 g 11  . gabapentin (NEURONTIN) 300 MG capsule Take 1-2 capsules (300-600 mg total) by mouth 3 (three) times daily. (Patient taking differently: Take 600 mg by mouth 3 (three) times daily. ) 180 capsule 11  . loratadine (CLARITIN) 10 MG tablet Take 10 mg by mouth at bedtime.     Marland Kitchen lubiprostone (AMITIZA) 24 MCG capsule Take 1 capsule (24 mcg total) by mouth 2 (two) times daily with a meal. 60 capsule 2  . Melatonin 10 MG TABS Take 10 mg by mouth at bedtime.     . methocarbamol (ROBAXIN) 500 MG tablet Take 500 mg by mouth at bedtime. Patient is taking half    . montelukast (SINGULAIR) 10 MG tablet Take 1 tablet (10 mg total) by mouth at bedtime. 30 tablet 11  . OIL OF OREGANO PO Take 1 capsule by mouth daily.     . Probiotic Product (UP4 PROBIOTICS ULTRA) CAPS Take 1 capsule by mouth daily.    . Simethicone 180 MG CAPS Take 1 capsule (180 mg total) by mouth 2 (two) times daily. (Patient taking differently: Take 180 mg by mouth 2 (two) times daily as needed (gas). ) 30 capsule 0  . Spacer/Aero-Holding Chambers (AEROCHAMBER MV) inhaler Use as instructed 1 each 0  . tapentadol (NUCYNTA ER) 100 MG 12 hr tablet Take 100 mg by mouth every 12 (twelve) hours.    . topiramate (TOPAMAX) 25 MG tablet Take 44m by mouth  twice daily for 2 weeks, then 226mby mouth twice daily for 2 wks, then 2564my mouth once daily for 1 week then stop. 91 tablet 0  . valACYclovir (VALTREX) 500 MG tablet Take 500 mg by mouth 2 (two) times daily as needed (outbreaks).     . zinc gluconate 50 MG tablet Take 50 mg by mouth daily.    . fenofibrate (TRICOR) 145 MG tablet TAKE 1 TABLET BY MOUTH EVERY DAY 90 tablet 1   No facility-administered medications prior to visit.    Allergies  Allergen Reactions  . Toradol [Ketorolac Tromethamine] Shortness Of Breath, Itching and Other (See Comments)    IM administration ONLY  REDNESS  . Aspirin Other (See Comments)    Stomach upset  . Codeine     Cough meds with codeine-have withdraw symptoms when done  . Ibuprofen     Stomach upset  . Latex     Vaginal irritation  . Penicillins Hives    Did it involve swelling of the face/tongue/throat, SOB, or low BP? No Did it involve sudden or severe rash/hives, skin peeling, or any reaction on the inside of your mouth or nose? Yes Did you need to seek medical attention at a hospital or doctor's office? No When did it last happen?2000 If all above answers are "NO", may proceed with cephalosporin use.     ROS Review of Systems Review of Systems  Constitutional: Negative for activity change, appetite change, chills and fever.  HENT: Negative for congestion, nosebleeds, trouble swallowing and voice change.   Respiratory: Negative for cough, shortness of breath and wheezing.   Gastrointestinal: Negative for diarrhea, nausea and vomiting.  Genitourinary: Negative for difficulty urinating, dysuria, flank pain and hematuria.  Musculoskeletal: chronic joint pains  Neurological: Negative for dizziness, speech difficulty, light-headedness and numbness.  See HPI. All other review of systems negative.     Objective:    Physical Exam  BP (!) 150/90 (BP Location: Left Arm, Patient Position: Sitting, Cuff Size: Normal)   Pulse 98   Temp  98 F (36.7 C) (Temporal)   Wt 163 lb 9.6 oz (74.2 kg)   SpO2 98%   BMI 28.98 kg/m  Wt Readings from Last 3 Encounters:  12/09/19 163 lb 9.6 oz (74.2 kg)  10/28/19 169 lb (76.7 kg)  09/12/19 169 lb 2 oz (76.7 kg)    Physical Exam  Constitutional: Oriented to person, place, and time. Appears well-developed and well-nourished.  HENT:  Head: Normocephalic and atraumatic.  Eyes: Conjunctivae and EOM are normal.  Cardiovascular: Normal rate, regular rhythm, normal heart sounds and intact distal pulses.  No murmur heard. Pulmonary/Chest: Effort normal and breath sounds normal. No stridor. No respiratory distress. Has no wheezes.  Neurological: Is alert and oriented to person, place, and time.  Skin: Skin is warm. Capillary refill takes less than 2 seconds.  Psychiatric: Has a normal mood and affect. Behavior is normal. Judgment and thought content normal.   There are no preventive care reminders to display for this patient.  There are no preventive care reminders to display for this patient.  No results found for: TSH Lab Results  Component Value Date   WBC 7.0 09/11/2019   HGB 11.5 (L) 09/11/2019   HCT 35.4 (L) 09/11/2019   MCV 96.7 09/11/2019   PLT 340 09/11/2019   Lab Results  Component Value Date   NA 149 (H) 12/09/2019   K 4.2 12/09/2019   CO2 19 (L) 12/09/2019   GLUCOSE 84 12/09/2019   BUN 19 12/09/2019   CREATININE 1.19 (H) 12/09/2019   BILITOT 0.2 12/09/2019   ALKPHOS 59 12/09/2019   AST 30 12/09/2019   ALT 16 12/09/2019   PROT 8.2 12/09/2019   ALBUMIN 4.7 12/09/2019   CALCIUM 10.4 (H) 12/09/2019   ANIONGAP 6 09/11/2019   No results found for: CHOL No results found for: HDL No results found for: LDLCALC No results found for: TRIG No results found for: CHOLHDL No results found for: HGBA1C    Assessment & Plan:   Problem List Items Addressed This Visit      Cardiovascular and Mediastinum   Essential hypertension   Relevant Medications   fenofibrate  (TRICOR) 145 MG tablet   Other Relevant Orders   Comprehensive metabolic panel (Completed)    Other Visit Diagnoses    Need for hepatitis C screening test    -  Primary   Relevant Orders   HCV Ab w Reflex to Quant PCR (Completed)   Elevated cholesterol       Relevant Medications   fenofibrate (TRICOR) 145 MG tablet   Other Relevant Orders   Comprehensive metabolic panel (Completed)   Lipid panel   Pure hypercholesterolemia, unspecified       Relevant Medications   fenofibrate (TRICOR) 145 MG tablet    Essential Hypertension Patient advised to come off the meloxicam and monitor bp to see if this is a reaction to the medication Will check renal function today   Pt will also stop the topamax Her neurologist is using botox for her trigeminal neuralgia She is on citrate juices    Meds ordered this encounter  Medications  . fenofibrate (TRICOR) 145 MG tablet    Sig: Take 1 tablet (145 mg total) by mouth  daily.    Dispense:  90 tablet    Refill:  1    Follow-up: No follow-ups on file.    Forrest Moron, MD

## 2019-12-09 NOTE — Patient Instructions (Signed)
° ° ° °  If you have lab work done today you will be contacted with your lab results within the next 2 weeks.  If you have not heard from us then please contact us. The fastest way to get your results is to register for My Chart. ° ° °IF you received an x-ray today, you will receive an invoice from [facility]. Please contact [facility] at [phone] with questions or concerns regarding your invoice.  ° °IF you received labwork today, you will receive an invoice from [facility]. Please contact [facility] at [phone] with questions or concerns regarding your invoice.  ° °Our billing staff will not be able to assist you with questions regarding bills from these companies. ° °You will be contacted with the lab results as soon as they are available. The fastest way to get your results is to activate your My Chart account. Instructions are located on the last page of this paperwork. If you have not heard from us regarding the results in 2 weeks, please contact this office. °  ° ° ° °

## 2019-12-11 LAB — COMPREHENSIVE METABOLIC PANEL
ALT: 16 IU/L (ref 0–32)
AST: 30 IU/L (ref 0–40)
Albumin/Globulin Ratio: 1.3 (ref 1.2–2.2)
Albumin: 4.7 g/dL (ref 3.8–4.9)
Alkaline Phosphatase: 59 IU/L (ref 39–117)
BUN/Creatinine Ratio: 16 (ref 9–23)
BUN: 19 mg/dL (ref 6–24)
Bilirubin Total: 0.2 mg/dL (ref 0.0–1.2)
CO2: 19 mmol/L — ABNORMAL LOW (ref 20–29)
Calcium: 10.4 mg/dL — ABNORMAL HIGH (ref 8.7–10.2)
Chloride: 110 mmol/L — ABNORMAL HIGH (ref 96–106)
Creatinine, Ser: 1.19 mg/dL — ABNORMAL HIGH (ref 0.57–1.00)
GFR calc Af Amer: 59 mL/min/{1.73_m2} — ABNORMAL LOW (ref >59)
GFR calc non Af Amer: 51 mL/min/{1.73_m2} — ABNORMAL LOW (ref >59)
Globulin, Total: 3.5 g/dL (ref 1.5–4.5)
Glucose: 84 mg/dL (ref 65–99)
Potassium: 4.2 mmol/L (ref 3.5–5.2)
Sodium: 149 mmol/L — ABNORMAL HIGH (ref 134–144)
Total Protein: 8.2 g/dL (ref 6.0–8.5)

## 2019-12-11 LAB — HCV AB W REFLEX TO QUANT PCR: HCV Ab: <0.1 s/co ratio (ref 0.0–0.9)

## 2019-12-11 LAB — HCV INTERPRETATION

## 2019-12-13 ENCOUNTER — Telehealth: Payer: Self-pay | Admitting: Family Medicine

## 2019-12-13 NOTE — Telephone Encounter (Signed)
Pt is stating she was suppose to get a cholesterol panel done with her last blood draw on  12/09/19. I see active orders for this on

## 2019-12-13 NOTE — Telephone Encounter (Signed)
12/09/19. She would like this done and does not want to have to pay for this. She says she talked about having this done. Please advise at [phone].

## 2019-12-13 NOTE — Telephone Encounter (Signed)
Lipid panel has been ordered for this pt, sending message to Dav[facility] to contact lab

## 2019-12-16 DIAGNOSIS — M47812 Spondylosis without myelopathy or radiculopathy, cervical region: Secondary | ICD-10-CM | POA: Diagnosis not present

## 2019-12-23 ENCOUNTER — Telehealth: Payer: Self-pay | Admitting: Family Medicine

## 2019-12-23 NOTE — Telephone Encounter (Signed)
Pt called and is wanting a nurse to give her a call regarding her calcium intake. [phone] Please advise.

## 2019-12-24 ENCOUNTER — Ambulatory Visit
Admission: RE | Admit: 2019-12-24 | Discharge: 2019-12-24 | Disposition: A | Discharge status: Home / Self Care | Payer: Medicare Other | Source: Ambulatory Visit | Attending: Obstetrics and Gynecology | Admitting: Obstetrics and Gynecology

## 2019-12-24 ENCOUNTER — Other Ambulatory Visit: Payer: Self-pay

## 2019-12-24 DIAGNOSIS — M810 Age-related osteoporosis without current pathological fracture: Secondary | ICD-10-CM

## 2019-12-24 DIAGNOSIS — Z780 Asymptomatic menopausal state: Secondary | ICD-10-CM | POA: Diagnosis not present

## 2019-12-24 DIAGNOSIS — M85851 Other specified disorders of bone density and structure, right thigh: Secondary | ICD-10-CM | POA: Diagnosis not present

## 2019-12-24 NOTE — Telephone Encounter (Signed)
Spoke with pt and she still has some concerns bout her labs. For one, she is concerned with her calcium still being elevated and she has not been taking anything in the past 6 weeks and even tho you stated that she kindney functions has improved, she is still concerned with the #'s being high.   Please Advise.

## 2019-12-25 ENCOUNTER — Ambulatory Visit (INDEPENDENT_AMBULATORY_CARE_PROVIDER_SITE_OTHER): Payer: Medicare Other | Admitting: Emergency Medicine

## 2019-12-25 DIAGNOSIS — I10 Essential (primary) hypertension: Secondary | ICD-10-CM

## 2019-12-25 DIAGNOSIS — E7800 Pure hypercholesterolemia, unspecified: Secondary | ICD-10-CM

## 2019-12-25 DIAGNOSIS — R1011 Right upper quadrant pain: Secondary | ICD-10-CM | POA: Insufficient documentation

## 2019-12-25 LAB — LIPID PANEL
Chol/HDL Ratio: 2.4 ratio (ref 0.0–4.4)
Cholesterol, Total: 176 mg/dL (ref 100–199)
HDL: 72 mg/dL (ref >39)
LDL Chol Calc (NIH): 91 mg/dL (ref 0–99)
Triglycerides: 67 mg/dL (ref 0–149)
VLDL Cholesterol Cal: 13 mg/dL (ref 5–40)

## 2019-12-25 NOTE — Progress Notes (Signed)
Labs only visit

## 2019-12-26 DIAGNOSIS — G894 Chronic pain syndrome: Secondary | ICD-10-CM | POA: Diagnosis not present

## 2019-12-26 DIAGNOSIS — M47816 Spondylosis without myelopathy or radiculopathy, lumbar region: Secondary | ICD-10-CM | POA: Diagnosis not present

## 2019-12-26 DIAGNOSIS — M47812 Spondylosis without myelopathy or radiculopathy, cervical region: Secondary | ICD-10-CM | POA: Diagnosis not present

## 2019-12-26 DIAGNOSIS — Z79899 Other long term (current) drug therapy: Secondary | ICD-10-CM | POA: Diagnosis not present

## 2019-12-26 DIAGNOSIS — M25561 Pain in right knee: Secondary | ICD-10-CM | POA: Diagnosis not present

## 2019-12-26 DIAGNOSIS — Z5181 Encounter for therapeutic drug level monitoring: Secondary | ICD-10-CM | POA: Diagnosis not present

## 2019-12-27 ENCOUNTER — Other Ambulatory Visit (INDEPENDENT_AMBULATORY_CARE_PROVIDER_SITE_OTHER): Payer: Medicare Other

## 2019-12-27 ENCOUNTER — Other Ambulatory Visit: Payer: Self-pay | Admitting: Gastroenterology

## 2019-12-27 ENCOUNTER — Encounter: Payer: Self-pay | Admitting: Gastroenterology

## 2019-12-27 ENCOUNTER — Ambulatory Visit (INDEPENDENT_AMBULATORY_CARE_PROVIDER_SITE_OTHER): Payer: Medicare Other | Admitting: Gastroenterology

## 2019-12-27 VITALS — BP 140/90 | HR 87 | Ht 63.0 in | Wt 166.0 lb

## 2019-12-27 DIAGNOSIS — K8020 Calculus of gallbladder without cholecystitis without obstruction: Secondary | ICD-10-CM

## 2019-12-27 DIAGNOSIS — R1011 Right upper quadrant pain: Secondary | ICD-10-CM | POA: Diagnosis not present

## 2019-12-27 DIAGNOSIS — K5904 Chronic idiopathic constipation: Secondary | ICD-10-CM

## 2019-12-27 DIAGNOSIS — Z8379 Family history of other diseases of the digestive system: Secondary | ICD-10-CM

## 2019-12-27 DIAGNOSIS — K219 Gastro-esophageal reflux disease without esophagitis: Secondary | ICD-10-CM | POA: Diagnosis not present

## 2019-12-27 DIAGNOSIS — R198 Other specified symptoms and signs involving the digestive system and abdomen: Secondary | ICD-10-CM | POA: Diagnosis not present

## 2019-12-27 DIAGNOSIS — N200 Calculus of kidney: Secondary | ICD-10-CM | POA: Diagnosis not present

## 2019-12-27 DIAGNOSIS — K582 Mixed irritable bowel syndrome: Secondary | ICD-10-CM

## 2019-12-27 DIAGNOSIS — R159 Full incontinence of feces: Secondary | ICD-10-CM

## 2019-12-27 DIAGNOSIS — M85851 Other specified disorders of bone density and structure, right thigh: Secondary | ICD-10-CM | POA: Diagnosis not present

## 2019-12-27 LAB — IGA: IgA: 139 mg/dL (ref 68–378)

## 2019-12-27 MED ORDER — DEXILANT 60 MG PO CPDR: 60.0000 mg | DELAYED_RELEASE_CAPSULE | Freq: Every day | ORAL | 3 refills | Status: DC

## 2019-12-27 MED ORDER — DICYCLOMINE HCL 20 MG PO TABS: 20.0000 mg | ORAL_TABLET | Freq: Three times a day (TID) | ORAL | 1 refills | Status: DC | PRN

## 2019-12-27 MED ORDER — LUBIPROSTONE 24 MCG PO CAPS: 24.0000 ug | ORAL_CAPSULE | Freq: Two times a day (BID) | ORAL | 3 refills | Status: DC

## 2019-12-27 NOTE — Progress Notes (Signed)
Cassidy Olsen    [id number]    04-06-1961  Primary Care Physician:Stallings, Arlie Solomons, MD  Referring Physician: Forrest Moron, MD [place],  [place] [id number]   Chief complaint:  Abdominal pain, constipation, bloating  HPI: 59 year old very pleasant female here for new patient visit to establish GI care.  She was previously followed by [facility] [Dr. Schooler]  Colonoscopy February 2018, small adenomatous colon polyp removed and medium-sized hemorrhoids  EGD February 2018 showed hiatal hernia and gastritis.  Biopsies negative for H. pylori.  Duodenal biopsies negative for celiac.  Patient had work-up in [place], HIDA scan and small bowel series insert negative for any significant pathology  Sister has celiac disease and her daughter has gluten sensitivity  Kidney stone removal 09/2019  On Amitiza 24 mcg BID, has on average has 1-3 BM, at times doesn't feel completely empty  Fecal incontinence intermittently, 2 weeks ago had an episode of full incontinence during sleep, had 4 episodes so far in the past 2 months She had episodes of fecal urgency prior to that  IBS with alternating constipation and diarrhea  For past 1-2 years  Exercises regularly and is also trying to eat healthy, cut out sugars and desserts from her diet  CT abdomen and pelvis with contrast December 2020 1. Obstructing 8 x 6 mm calculus at the left ureteropelvic junction resulting in mild-to-moderate left hydronephrosis with small volume perinephric fluid and stranding. Extensive left-sided urothelial enhancement and stranding raises suspicion for a superimposed infectious or inflammatory process. 2. Thickened appearance of the urinary bladder, particularly along its superior margin. Recommend correlation with urinalysis. 3. Multiple nonobstructing right renal calculi, largest measuring up to 9 mm. 4. Cholelithiasis without evidence for cholecystitis. 5. Previously seen left  adnexal cyst has slightly decreased in size.  Outpatient Encounter Medications as of 12/27/2019  Medication Sig  . acetaminophen (TYLENOL) 325 MG tablet Take 650 mg by mouth daily.  Marland Kitchen albuterol (VENTOLIN HFA) 108 (90 Base) MCG/ACT inhaler Inhale 2 puffs into the lungs every 6 (six) hours as needed for wheezing or shortness of breath.  . AMITIZA 24 MCG capsule Take 1 capsule (24 mcg total) by mouth 2 (two) times daily. (Patient not taking: Reported on 12/24/2019)  . amitriptyline (ELAVIL) 50 MG tablet Take 1 tablet (50 mg total) by mouth at bedtime. (Patient not taking: Reported on 12/24/2019)  . Ascorbic Acid (VITAMIN C) 1000 MG tablet Take 1,000 mg by mouth daily.  . budesonide-formoterol (SYMBICORT) 80-4.5 MCG/ACT inhaler Inhale 2 puffs into the lungs every 12 (twelve) hours. (Patient taking differently: Inhale 2 puffs into the lungs 2 (two) times daily as needed (respiratory issues.). )  . Calcium Carb-Cholecalciferol (CALCIUM + D3 PO) Take 1 tablet by mouth daily.  . celecoxib (CELEBREX) 200 MG capsule Take 200 mg by mouth daily.   . cholecalciferol (VITAMIN D) 25 MCG (1000 UT) tablet Take 1,000 Units by mouth daily.  . cyanocobalamin 1000 MCG tablet Take 1,000 mcg by mouth daily.  Marland Kitchen dexlansoprazole (DEXILANT) 60 MG capsule Take 60 mg by mouth daily.  Marland Kitchen ELDERBERRY PO Take 5 mLs by mouth daily.   . fenofibrate (TRICOR) 145 MG tablet Take 1 tablet (145 mg total) by mouth daily.  . fluticasone (FLONASE) 50 MCG/ACT nasal spray Place 2 sprays into both nostrils daily. (Patient taking differently: Place 2 sprays into both nostrils 2 (two) times daily. )  . gabapentin (NEURONTIN) 300 MG capsule Take 1-2 capsules (  300-600 mg total) by mouth 3 (three) times daily. (Patient taking differently: Take 600 mg by mouth 3 (three) times daily. )  . loratadine (CLARITIN) 10 MG tablet Take 10 mg by mouth at bedtime.   Marland Kitchen lubiprostone (AMITIZA) 24 MCG capsule Take 1 capsule (24 mcg total) by mouth 2 (two) times  daily with a meal.  . Melatonin 10 MG TABS Take 10 mg by mouth at bedtime.   . methocarbamol (ROBAXIN) 500 MG tablet Take 500 mg by mouth at bedtime. Patient is taking half  . montelukast (SINGULAIR) 10 MG tablet Take 1 tablet (10 mg total) by mouth at bedtime.  . OIL OF OREGANO PO Take 1 capsule by mouth daily.   . Probiotic Product (UP4 PROBIOTICS ULTRA) CAPS Take 1 capsule by mouth daily.  . Simethicone 180 MG CAPS Take 1 capsule (180 mg total) by mouth 2 (two) times daily. (Patient not taking: Reported on 12/24/2019)  . Spacer/Aero-Holding Chambers (AEROCHAMBER MV) inhaler Use as instructed  . tapentadol (NUCYNTA ER) 100 MG 12 hr tablet Take 100 mg by mouth every 12 (twelve) hours.  . topiramate (TOPAMAX) 25 MG tablet Take 51m by mouth twice daily for 2 weeks, then 260mby mouth twice daily for 2 wks, then 2528my mouth once daily for 1 week then stop. (Patient not taking: Reported on 12/24/2019)  . valACYclovir (VALTREX) 500 MG tablet Take 500 mg by mouth 2 (two) times daily as needed (outbreaks).   . zinc gluconate 50 MG tablet Take 50 mg by mouth daily.   No facility-administered encounter medications on file as of 12/27/2019.    Allergies as of 12/27/2019 - Review Complete 12/09/2019  Allergen Reaction Noted  . Toradol [ketorolac tromethamine] Shortness Of Breath, Itching, and Other (See Comments) 08/14/2019  . Aspirin Other (See Comments) 11/18/2016  . Codeine  07/17/2018  . Ibuprofen  11/18/2016  . Latex  11/18/2016  . Lyrica [pregabalin]  12/25/2019  . Omeprazole magnesium  12/25/2019  . Pantoprazole sodium  12/25/2019  . Penicillins Hives 11/18/2016    Past Medical History:  Diagnosis Date  . Asthma   . Chronic pain   . Family history of breast cancer   . Family history of prostate cancer   . GERD (gastroesophageal reflux disease)   . High cholesterol   . IBS (irritable bowel syndrome)   . Migraine   . Occipital neuralgia   . Osteoarthritis   . Spinal stenosis   .  Trigeminal neuralgia   . Trigeminal neuralgia     Past Surgical History:  Procedure Laterality Date  . ABDOMINAL HYSTERECTOMY    . ANKLE SURGERY Left 05/2009  . APPENDECTOMY    . BACK SURGERY    . 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    . CARPAL TUNNEL RELEASE  2014  . CYSTOSCOPY W/ URETERAL STENT PLACEMENT Left 07/25/2019   Procedure: CYSTOSCOPY WITH RETROGRADE PYELOGRAM/URETERAL STENT PLACEMENT;  Surgeon: DahFranchot GalloD;  Location: [facility];  Service: Urology;  Laterality: Left;  . CYSTOSCOPY W/ URETERAL STENT REMOVAL Left 08/15/2019   Procedure: CYSTOSCOPY WITH STENT REMOVAL;  Surgeon: DahFranchot GalloD;  Location: [facility];  Service: Urology;  Laterality: Left;  . CYSTOSCOPY WITH RETROGRADE PYELOGRAM, URETEROSCOPY AND STENT PLACEMENT Left 08/15/2019   Procedure: CYSTOSCOPY WITH RETROGRADE PYELOGRAM, URETEROSCOPY;  Surgeon: DahFranchot GalloD;  Location: [facility];  Service: Urology;  Laterality: Left;  90 MINS  .  CYSTOSCOPY WITH RETROGRADE PYELOGRAM, URETEROSCOPY AND STENT PLACEMENT Right 09/12/2019   Procedure: CYSTOSCOPY WITH RIGHT RETROGRADE PYELOGRAM  URETEROSCOPY WITH HOLMIUM LASER STONE EXTRACTION AND STENT PLACEMENT;  Surgeon: Franchot Gallo, MD;  Location: [facility];  Service: Urology;  Laterality: Right;  . HOLMIUM LASER APPLICATION Left 01/15/6788   Procedure: HOLMIUM LASER APPLICATION;  Surgeon: Franchot Gallo, MD;  Location: [facility];  Service: Urology;  Laterality: Left;  . JOINT REPLACEMENT     R knee  . OVARIAN CYST REMOVAL  05/2010  . right knee revision  2016  . ROTATOR CUFF REPAIR Right 02/2019  . SPINE SURGERY    . TUBAL LIGATION      Family History  Problem Relation Age of Onset  . Ulcers Mother 64       peptic ulcer  . Prostate cancer Father 8  . Breast cancer Sister 51       ER+/PR+/Her2- breast cancer  . Breast cancer Paternal Grandmother   . Diabetes Paternal  Grandmother   . Cancer Paternal Uncle        unknown cancers    Social History   Socioeconomic History  . Marital status: Single    Spouse name: Not on file  . Number of children: 1  . Years of education: Not on file  . Highest education level: Not on file  Occupational History  . Not on file  Tobacco Use  . Smoking status: Never Smoker  . Smokeless tobacco: Never Used  Substance and Sexual Activity  . Alcohol use: Yes    Comment: socially  . Drug use: No  . Sexual activity: Not on file  Other Topics Concern  . Not on file  Social History Narrative  . Not on file   Social Determinants of Health   Financial Resource Strain:   . Difficulty of Paying Living Expenses:   Food Insecurity:   . Worried About Charity fundraiser in the Last Year:   . Arboriculturist in the Last Year:   Transportation Needs:   . Film/video editor (Medical):   Marland Kitchen Lack of Transportation (Non-Medical):   Physical Activity:   . Days of Exercise per Week:   . Minutes of Exercise per Session:   Stress:   . Feeling of Stress :   Social Connections:   . Frequency of Communication with Friends and Family:   . Frequency of Social Gatherings with Friends and Family:   . Attends Religious Services:   . Active Member of Clubs or Organizations:   . Attends Archivist Meetings:   Marland Kitchen Marital Status:   Intimate Partner Violence:   . Fear of Current or Ex-Partner:   . Emotionally Abused:   Marland Kitchen Physically Abused:   . Sexually Abused:       Review of systems: All other review of systems negative except as mentioned in the HPI.   Physical Exam: Vitals:   12/27/19 1410  BP: 140/90  Pulse: 87  SpO2: 98%   Body mass index is 29.41 kg/m. Gen:      No acute distress Abd:       soft, right side tender; no palpable masses, no distension Ext:    No edema Neuro: alert and oriented x 3 Psych: normal mood and affect  Data Reviewed:  Reviewed labs, radiology imaging, old records and  pertinent past GI work up   Assessment and Plan/Recommendations:  59 year old very pleasant female here for new patient visit with complaints of chronic  right-sided abdominal pain, irritable bowel syndrome with both constipation and diarrhea  Right-sided abdominal pain: Unclear etiology History of bilateral nephrolithiasis s/p lithotripsy ?  Adhesions s/p abdominal pelvic surgery.  History of ovarian cysts  History of small gallstones, normal HIDA scan in the past Will repeat abdominal ultrasound to exclude gallbladder disease  IBS with constipation and diarrhea: Continue lubiprostone 24 mcg twice daily.  Refill sent Add psyllium husk twice daily with meals Continue with increased fluid intake  Abdominal cramping and discomfort: Dicyclomine 20 mg up to 3 times daily as needed  Fecal incontinence: Add fiber supplements/psyllium husk with meals Kegel exercise If continues to have persistent symptoms, will consider anorectal manometry for further evaluation and referral to pelvic floor physical therapy for biofeedback  Abdominal bloating, right upper quadrant discomfort, family history of celiac disease Check TTG IgA antibody and total IgA level to exclude celiac disease  GERD: Continue Dexilant and antireflux measures.  Refill sent  Return in 2 to 3 months or sooner if needed  This visit required 60 minutes of patient care (this includes precharting, chart review, review of results, face-to-face time used for counseling as well as treatment plan and follow-up. The patient was provided an opportunity to ask questions and all were answered. The patient agreed with the plan and demonstrated an understanding of the instructions.  Damaris Hippo , MD    CC: Forrest Moron, MD

## 2019-12-27 NOTE — Patient Instructions (Addendum)
If you are age 58 or older, your body mass index should be between 23-30. Your Body mass index is 29.41 kg/m. If this is out of the aforementioned range listed, please consider follow up with your Primary Care Provider.  If you are age 67 or younger, your body mass index should be between 19-25. Your Body mass index is 29.41 kg/m. If this is out of the aformentioned range listed, please consider follow up with your Primary Care Provider.   Your provider has requested that you go to the basement level for lab work before leaving today. Press "B" on the elevator. The lab is located at the first door on the left as you exit the elevator.  Due to recent changes in healthcare laws, you may see the results of your imaging and laboratory studies on MyChart before your provider has had a chance to review them.  We understand that in some cases there may be results that are confusing or concerning to you. Not all laboratory results come back in the same time frame and the provider may be waiting for multiple results in order to interpret others.  Please give [place] 48 hours in order for your provider to thoroughly review all the results before contacting the office for clarification of your results.    We have sent the following medications to your pharmacy for you to pick up at your convenience:  CONTINUE: Dexilant  CONTINUE: Amitiza  START: dicyclomine 20mg  take one tablet three times daily as needed .  Please purchase the following medications over the counter and take as directed:  START: psyllium husk twice daily.   Due to recent changes in healthcare laws, you may see the results of your imaging and laboratory studies on MyChart before your provider has had a chance to review them.  We understand that in some cases there may be results that are confusing or concerning to you. Not all laboratory results come back in the same time frame and the provider may be waiting for multiple results in order to  interpret others.  Please give [place] 48 hours in order for your provider to thoroughly review all the results before contacting the office for clarification of your results.   You have been scheduled for an abdominal ultrasound at [facility] (1st floor of hospital) on 01-01-20 at 10:30am. Please arrive 15 minutes prior to your appointment for registration. Make certain not to have anything to eat or drink after midnight prior to your appointment. Should you need to reschedule your appointment, please contact radiology at [phone]. This test typically takes about 30 minutes to perform.  You will be seen in the office for follow up appointment in 2 - 3 months.  (August 2021).   I appreciate the opportunity to care for you. Thank you for choosing me and [facility],  Dr. Harl Bowie   Kegel Exercises  Kegel exercises can help strengthen your pelvic floor muscles. The pelvic floor is a group of muscles that support your rectum, small intestine, and bladder. In females, pelvic floor muscles also help support the womb (uterus). These muscles help you control the flow of urine and stool. Kegel exercises are painless and simple, and they do not require any equipment. Your provider may suggest Kegel exercises to:  Improve bladder and bowel control.  Improve sexual response.  Improve weak pelvic floor muscles after surgery to remove the uterus (hysterectomy) or pregnancy (females).  Improve weak pelvic floor muscles after prostate gland removal  or surgery (males). Kegel exercises involve squeezing your pelvic floor muscles, which are the same muscles you squeeze when you try to stop the flow of urine or keep from passing gas. The exercises can be done while sitting, standing, or lying down, but it is best to vary your position. Exercises How to do Kegel exercises: 1. Squeeze your pelvic floor muscles tight. You should feel a tight lift in your rectal area. If you are a  female, you should also feel a tightness in your vaginal area. Keep your stomach, buttocks, and legs relaxed. 2. Hold the muscles tight for up to 10 seconds. 3. Breathe normally. 4. Relax your muscles. 5. Repeat as told by your health care provider. Repeat this exercise daily as told by your health care provider. Continue to do this exercise for at least 4-6 weeks, or for as long as told by your health care provider. You may be referred to a physical therapist who can help you learn more about how to do Kegel exercises. Depending on your condition, your health care provider may recommend:  Varying how long you squeeze your muscles.  Doing several sets of exercises every day.  Doing exercises for several weeks.  Making Kegel exercises a part of your regular exercise routine. This information is not intended to replace advice given to you by your health care provider. Make sure you discuss any questions you have with your health care provider. Document Revised: 03/14/2018 Document Reviewed: 03/14/2018 Elsevier Patient Education  [place].

## 2019-12-30 ENCOUNTER — Telehealth: Payer: Self-pay | Admitting: Gastroenterology

## 2019-12-30 DIAGNOSIS — F4325 Adjustment disorder with mixed disturbance of emotions and conduct: Secondary | ICD-10-CM | POA: Diagnosis not present

## 2019-12-30 MED ORDER — DEXILANT 60 MG PO CPDR: 60.0000 mg | DELAYED_RELEASE_CAPSULE | Freq: Every day | ORAL | 3 refills | Status: DC

## 2019-12-30 NOTE — Telephone Encounter (Signed)
I called the pharmacy and they said the [place] rx got deactivated so they need me to send in another request. I will do so now.

## 2019-12-31 LAB — TISSUE TRANSGLUTAMINASE ABS,IGG,IGA
(tTG) Ab, IgA: <1 U/mL
(tTG) Ab, IgG: 1 U/mL

## 2020-01-01 ENCOUNTER — Ambulatory Visit (HOSPITAL_COMMUNITY): Payer: Medicare Other

## 2020-01-01 DIAGNOSIS — M47816 Spondylosis without myelopathy or radiculopathy, lumbar region: Secondary | ICD-10-CM | POA: Diagnosis not present

## 2020-01-01 DIAGNOSIS — M5136 Other intervertebral disc degeneration, lumbar region: Secondary | ICD-10-CM | POA: Diagnosis not present

## 2020-01-01 DIAGNOSIS — G894 Chronic pain syndrome: Secondary | ICD-10-CM | POA: Diagnosis not present

## 2020-01-01 DIAGNOSIS — M47812 Spondylosis without myelopathy or radiculopathy, cervical region: Secondary | ICD-10-CM | POA: Diagnosis not present

## 2020-01-02 ENCOUNTER — Other Ambulatory Visit: Payer: Self-pay

## 2020-01-02 ENCOUNTER — Ambulatory Visit (HOSPITAL_COMMUNITY)
Admission: RE | Admit: 2020-01-02 | Discharge: 2020-01-02 | Disposition: A | Discharge status: Home / Self Care | Payer: Medicare Other | Source: Ambulatory Visit | Attending: Gastroenterology | Admitting: Gastroenterology

## 2020-01-02 DIAGNOSIS — K5904 Chronic idiopathic constipation: Secondary | ICD-10-CM | POA: Insufficient documentation

## 2020-01-02 DIAGNOSIS — R1011 Right upper quadrant pain: Secondary | ICD-10-CM | POA: Insufficient documentation

## 2020-01-02 DIAGNOSIS — K8020 Calculus of gallbladder without cholecystitis without obstruction: Secondary | ICD-10-CM | POA: Diagnosis not present

## 2020-01-02 DIAGNOSIS — R198 Other specified symptoms and signs involving the digestive system and abdomen: Secondary | ICD-10-CM | POA: Diagnosis not present

## 2020-01-02 DIAGNOSIS — N200 Calculus of kidney: Secondary | ICD-10-CM | POA: Diagnosis not present

## 2020-01-03 ENCOUNTER — Other Ambulatory Visit: Payer: Self-pay

## 2020-01-03 ENCOUNTER — Encounter: Payer: Self-pay | Admitting: Gastroenterology

## 2020-01-03 MED ORDER — DICYCLOMINE HCL 10 MG PO CAPS: 20.0000 mg | ORAL_CAPSULE | Freq: Three times a day (TID) | ORAL | 0 refills | Status: DC

## 2020-01-03 NOTE — Telephone Encounter (Signed)
Spoke with the patient about her imaging results. I will fax the report to [facility] as requested. She understands the plan of care. She asks if her Bentyl can be changed to Capsules from tablets. I told her I would see if that were possible.  Thanks me for my call back to her.

## 2020-01-03 NOTE — Telephone Encounter (Signed)
Patient called stated she just read her [place] results thru [facility] and she would like to discuss further also requested we send results over to her Urologist

## 2020-01-07 ENCOUNTER — Other Ambulatory Visit: Payer: Self-pay | Admitting: Orthopedic Surgery

## 2020-01-07 ENCOUNTER — Other Ambulatory Visit (HOSPITAL_COMMUNITY): Payer: Self-pay | Admitting: Orthopedic Surgery

## 2020-01-07 ENCOUNTER — Ambulatory Visit: Payer: Medicare Other | Admitting: Family Medicine

## 2020-01-07 ENCOUNTER — Telehealth: Payer: Self-pay | Admitting: Family Medicine

## 2020-01-07 DIAGNOSIS — N202 Calculus of kidney with calculus of ureter: Secondary | ICD-10-CM | POA: Diagnosis not present

## 2020-01-07 DIAGNOSIS — N200 Calculus of kidney: Secondary | ICD-10-CM | POA: Diagnosis not present

## 2020-01-07 DIAGNOSIS — K8020 Calculus of gallbladder without cholecystitis without obstruction: Secondary | ICD-10-CM | POA: Diagnosis not present

## 2020-01-07 DIAGNOSIS — Z96651 Presence of right artificial knee joint: Secondary | ICD-10-CM

## 2020-01-07 DIAGNOSIS — R1084 Generalized abdominal pain: Secondary | ICD-10-CM | POA: Diagnosis not present

## 2020-01-07 DIAGNOSIS — T8484XA Pain due to internal orthopedic prosthetic devices, implants and grafts, initial encounter: Secondary | ICD-10-CM

## 2020-01-07 NOTE — Telephone Encounter (Signed)
I called pt and was able to offer her tomorrow at 1000 appt for occiptial nerve block.  She is going out to town. (she is off all her meds for Trigeminal neuralgia).

## 2020-01-07 NOTE — Telephone Encounter (Signed)
Botox will not cover an appointment this early. Amy, do you see necessity for a nerve block?

## 2020-01-07 NOTE — Telephone Encounter (Signed)
Noted  

## 2020-01-07 NOTE — Telephone Encounter (Signed)
Pt called needing to r/s her BOTOX appt due to an emergency that came up in the family and her needing to leave the state. Pt would like to know if she can get the Botox done today or if she can get a nerve block. Please advise.

## 2020-01-07 NOTE — Progress Notes (Signed)
Thank you :)

## 2020-01-08 ENCOUNTER — Ambulatory Visit (INDEPENDENT_AMBULATORY_CARE_PROVIDER_SITE_OTHER): Payer: Medicare Other | Admitting: Family Medicine

## 2020-01-08 ENCOUNTER — Telehealth: Payer: Self-pay | Admitting: Family Medicine

## 2020-01-08 ENCOUNTER — Encounter: Payer: Self-pay | Admitting: Family Medicine

## 2020-01-08 ENCOUNTER — Other Ambulatory Visit: Payer: Self-pay

## 2020-01-08 VITALS — BP 146/88 | HR 98 | Ht 62.5 in | Wt 166.0 lb

## 2020-01-08 DIAGNOSIS — M25511 Pain in right shoulder: Secondary | ICD-10-CM | POA: Diagnosis not present

## 2020-01-08 DIAGNOSIS — G500 Trigeminal neuralgia: Secondary | ICD-10-CM

## 2020-01-08 DIAGNOSIS — M5481 Occipital neuralgia: Secondary | ICD-10-CM

## 2020-01-08 DIAGNOSIS — G43711 Chronic migraine without aura, intractable, with status migrainosus: Secondary | ICD-10-CM | POA: Diagnosis not present

## 2020-01-08 NOTE — Patient Instructions (Signed)
Occipital Neuralgia  Occipital neuralgia is a type of headache that causes brief episodes of very bad pain in the back of your head. Pain from occipital neuralgia may spread (radiate) to other parts of your head. These headaches may be caused by irritation of the nerves that leave your spinal cord high up in your neck, just below the base of your skull (occipital nerves). Your occipital nerves transmit sensations from the back of your head, the top of your head, and the areas behind your ears. What are the causes? This condition can occur without any known cause (primary headache syndrome). In other cases, this condition is caused by pressure on or irritation of one of the two occipital nerves. Pressure and irritation may be due to:  Muscle spasm in the neck.  Neck injury.  Wear and tear of the vertebrae in the neck (osteoarthritis).  Disease of the disks that separate the vertebrae.  Swollen blood vessels that put pressure on the occipital nerves.  Infections.  Tumors.  Diabetes. What are the signs or symptoms? This condition causes brief burning, stabbing, electric, shocking, or shooting pain which can radiate to the top of the head. It can happen on one side or both sides of the head. It can also cause:  Pain behind the eye.  Pain triggered by neck movement or hair brushing.  Scalp tenderness.  Aching in the back of the head between episodes of very bad pain.  Pain gets worse with exposure to bright lights. How is this diagnosed? There is no test that diagnoses this condition. Your health care provider may diagnose this condition based on a physical exam and your symptoms. Other tests may be done, such as:  Imaging studies of the brain and neck (cervical spine), such as an MRI or CT scan. These look for causes of pinched nerves.  Applying pressure to the nerves in the neck to try to re-create the pain.  Injection of numbing medicine into the occipital nerve areas to  see if pain goes away (diagnostic nerve block). How is this treated? Treatment for this condition may begin with simple measures, such as:  Rest.  Massage.  Applying heat or cold on the area.  Over-the-counter pain relievers. If these measures do not work, you may need other treatments, including:  Medicines, such as: ? Prescription-strength anti-inflammatory medicines. ? Muscle relaxants. ? Anti-seizure medicines, which can relieve pain. ? Antidepressants, which can relieve pain. ? Injected medicines, such as medicines that numb the area (local anesthetic) and steroids.  Pulsed radiofrequency ablation. This is when wires are implanted to deliver electrical impulses that block pain signals from the occipital nerve.  Surgery to relieve nerve pressure.  Physical therapy. Follow these instructions at home: Pain management      Avoid any activities that cause pain.  Rest when you have an attack of pain.  Try gentle massage to relieve pain.  Try a different pillow or sleeping position.  If directed, apply heat to the affected area as told by your health care provider. Use the heat source that your health care provider recommends, such as a moist heat pack or a heating pad. ? Place a towel between your skin and the heat source. ? Leave the heat on for 20-30 minutes. ? Remove the heat if your skin turns bright red. This is especially important if you are unable to feel pain, heat, or cold. You may have a greater risk of getting burned.  If directed, apply ice  to the back of the head and neck area as told by your health care provider. ? Put ice in a plastic bag. ? Place a towel between your skin and the bag. ? Leave the ice on for 20 minutes, 2-3 times per day. General instructions  Take over-the-counter and prescription medicines only as told by your health care provider.  Avoid things that make your symptoms worse, such as bright lights.  Try to stay active. Get  regular exercise that does not cause pain. Ask your health care provider to suggest safe exercises for you.  Work with a physical therapist to learn stretching exercises you can do at home.  Practice good posture.  Keep all follow-up visits as told by your health care provider. This is important. Contact a health care provider if:  Your medicine is not working.  You have new or worsening symptoms. Get help right away if:  You have very bad head pain that does not go away.  You have a sudden change in vision, balance, or speech. Summary  Occipital neuralgia is a type of headache that causes brief episodes of very bad pain in the back of your head.  Pain from occipital neuralgia may spread (radiate) to other parts of your head.  Treatment for this condition includes rest, massage, and medicines. This information is not intended to replace advice given to you by your health care provider. Make sure you discuss any questions you have with your health care provider. Document Revised: 07/11/2017 Document Reviewed: 09/29/2016 Elsevier Patient Education  [employer].   Occipital Nerve Block Patient Information  Description: The occipital nerves originate in the cervical (neck) spinal cord and travel upward through muscle and tissue to supply sensation to the back of the head and top of the scalp.  In addition, the nerves control some of the muscles of the scalp.  Occipital neuralgia is an irritation of these nerves which can cause headaches, numbness of the scalp, and neck discomfort.     The occipital nerve block will interrupt nerve transmission through these nerves and can relieve pain and spasm.  The block consists of insertion of a small needle under the skin in the back of the head to deposit local anesthetic (numbing medicine) and/or steroids around the nerve.  The entire block usually lasts less than 5 minutes.  Conditions which may be treated by occipital  blocks:   Muscular pain and spasm of the scalp  Nerve irritation, back of the head  Headaches  Upper neck pain  Preparation for the injection:  5. Do not eat any solid food or dairy products within 8 hours of your appointment. 6. You may drink clear liquids up to 3 hours before appointment.  Clear liquids include water, black coffee, juice or soda.  No milk or cream please. 7. You may take your regular medication, including pain medications, with a sip of water before you appointment.  Diabetics should hold regular insulin (if taken separately) and take 1/2 normal NPH dose the morning of the procedure.  Carry some sugar containing items with you to your appointment. 8. A driver must accompany you and be prepared to drive you home after your procedure. 9. Bring all your current medications with you. 10. An IV may be inserted and sedation may be given at the discretion of the physician. 11. A blood pressure cuff, EKG, and other monitors will often be applied during the procedure.  Some patients may need to have extra oxygen administered  for a short period. 12. You will be asked to provide medical information, including your allergies and medications, prior to the procedure.  We must know immediately if you are taking blood thinners (like Coumadin/Warfarin) or if you are allergic to IV iodine contrast (dye).  We must know if you could possible be pregnant.  13. Do not wear a high collared shirt or turtleneck.  Tie long hair up in the back if possible.  Possible side-effects:   Bleeding from needle site  Infection (rare, may require surgery)  Nerve injury (rare)  Hair on back of neck can be tinged with iodine scrub (this will wash out)  Light-headedness (temporary)  Pain at injection site (several days)  Decreased blood pressure (rare, temporary)  Seizure (very rare)  Call if you experience:   Hives or difficulty breathing ( go to the emergency room)  Inflammation or  drainage at the injection site(s)  Please note:  Although the local anesthetic injected can often make your painful muscles or headache feel good for several hours after the injection, the pain may return.  It takes 3-7 days for steroids to work.  You may not notice any pain relief for at least one week.  If effective, we will often do a series of injections spaced 3-6 weeks apart to maximally decrease your pain.  If you have any questions, please call [phone] [facility]   Migraine Headache A migraine headache is a very strong throbbing pain on one side or both sides of your head. This type of headache can also cause other symptoms. It can last from 4 hours to 3 days. Talk with your doctor about what things may bring on (trigger) this condition. What are the causes? The exact cause of this condition is not known. This condition may be triggered or caused by:  Drinking alcohol.  Smoking.  Taking medicines, such as: ? Medicine used to treat chest pain (nitroglycerin). ? Birth control pills. ? Estrogen. ? Some blood pressure medicines.  Eating or drinking certain products.  Doing physical activity. Other things that may trigger a migraine headache include:  Having a menstrual period.  Pregnancy.  Hunger.  Stress.  Not getting enough sleep or getting too much sleep.  Weather changes.  Tiredness (fatigue). What increases the risk?  Being 44-12 years old.  Being female.  Having a family history of migraine headaches.  Being Caucasian.  Having depression or anxiety.  Being very overweight. What are the signs or symptoms?  A throbbing pain. This pain may: ? Happen in any area of the head, such as on one side or both sides. ? Make it hard to do daily activities. ? Get worse with physical activity. ? Get worse around bright lights or loud noises.  Other symptoms may include: ? Feeling sick to your stomach  (nauseous). ? Vomiting. ? Dizziness. ? Being sensitive to bright lights, loud noises, or smells.  Before you get a migraine headache, you may get warning signs (an aura). An aura may include: ? Seeing flashing lights or having blind spots. ? Seeing bright spots, halos, or zigzag lines. ? Having tunnel vision or blurred vision. ? Having numbness or a tingling feeling. ? Having trouble talking. ? Having weak muscles.  Some people have symptoms after a migraine headache (postdromal phase), such as: ? Tiredness. ? Trouble thinking (concentrating). How is this treated?  Taking medicines that: ? Relieve pain. ? Relieve the feeling of being sick to your stomach. ?  Prevent migraine headaches.  Treatment may also include: ? Having acupuncture. ? Avoiding foods that bring on migraine headaches. ? Learning ways to control your body functions (biofeedback). ? Therapy to help you know and deal with negative thoughts (cognitive behavioral therapy). Follow these instructions at home: Medicines  Take over-the-counter and prescription medicines only as told by your doctor.  Ask your doctor if the medicine prescribed to you: ? Requires you to avoid driving or using heavy machinery. ? Can cause trouble pooping (constipation). You may need to take these steps to prevent or treat trouble pooping:  Drink enough fluid to keep your pee (urine) pale yellow.  Take over-the-counter or prescription medicines.  Eat foods that are high in fiber. These include beans, whole grains, and fresh fruits and vegetables.  Limit foods that are high in fat and sugar. These include fried or sweet foods. Lifestyle  Do not drink alcohol.  Do not use any products that contain nicotine or tobacco, such as cigarettes, e-cigarettes, and chewing tobacco. If you need help quitting, ask your doctor.  Get at least 8 hours of sleep every night.  Limit and deal with stress. General instructions      Keep a  journal to find out what may bring on your migraine headaches. For example, write down: ? What you eat and drink. ? How much sleep you get. ? Any change in what you eat or drink. ? Any change in your medicines.  If you have a migraine headache: ? Avoid things that make your symptoms worse, such as bright lights. ? It may help to lie down in a dark, quiet room. ? Do not drive or use heavy machinery. ? Ask your doctor what activities are safe for you.  Keep all follow-up visits as told by your doctor. This is important. Contact a doctor if:  You get a migraine headache that is different or worse than others you have had.  You have more than 15 headache days in one month. Get help right away if:  Your migraine headache gets very bad.  Your migraine headache lasts longer than 72 hours.  You have a fever.  You have a stiff neck.  You have trouble seeing.  Your muscles feel weak or like you cannot control them.  You start to lose your balance a lot.  You start to have trouble walking.  You pass out (faint).  You have a seizure. Summary  A migraine headache is a very strong throbbing pain on one side or both sides of your head. These headaches can also cause other symptoms.  This condition may be treated with medicines and changes to your lifestyle.  Keep a journal to find out what may bring on your migraine headaches.  Contact a doctor if you get a migraine headache that is different or worse than others you have had.  Contact your doctor if you have more than 15 headache days in a month. This information is not intended to replace advice given to you by your health care provider. Make sure you discuss any questions you have with your health care provider. Document Revised: 11/16/2018 Document Reviewed: 09/06/2018 Elsevier Patient Education  [place].

## 2020-01-08 NOTE — Telephone Encounter (Signed)
When she was checking out for her appt, she said she needs to reschedule her botox appt for Marcellina Millin will be out of town until July 7. Amy is gone the next week and I didn't see any availability. She asked that you give her a call back to reschedule please

## 2020-01-08 NOTE — Progress Notes (Signed)
PATIENT: Cassidy Olsen DOB: [birth year]  REASON FOR VISIT: follow up HISTORY FROM: patient  Chief Complaint  Patient presents with  . Follow-up    NERVE BLOCK, alone, rm 13     HISTORY OF PRESENT ILLNESS: Today 01/08/20 Cassidy Olsen is a 59 y.o. female here today for follow up for migraines, trigeminal and occipital neuralgia. She reports feeling well pretty good today. She continues Botox, last procedure in 11/2019. Migraines (referred to as spasms by patient) are fairly well managed. On average she has about . Unilateral pulsating, pounding pain. Light and sound sensitivity. 15 headache (days with spasms) with 5-10 being migrainous. She feels that intensity is much less severe. Pain usually resolves within 30 minutes. She weaned topiramate due to side effects. She talked with PCP and decided to wean amitriptyline as well. She denies any side effects or difficulty taking amitriptyline. She has had more "spasms" of the right occipital region. She is taking gabapentin 600 TID which is helpful. She is also followed by [facility] and on [place] for chronic pain.   HISTORY: (copied from Dr Cathren Laine note on 05/28/2019)  Interval history May 27, 2019: Patient was initially seen in 2019 and has had several visits with nurse practitioner for her migraines, trigeminal neuralgia and occipital neuralgia, she has been given nerve blocks.  She has been titrated on gabapentin.  She seen Dr. Nancy Nordmann for sleep evaluation.  Sleep study did not show any significant obstructive sleep apnea no significant snoring.  She slept reasonably well and achieved all stages of sleep.  She fell in September of this year 2020 and at that time she increased her gabapentin which helped, also a nerve block helped.  At the time she was on Vicodin for shoulder pain however she was having upcoming surgery which was likely already completed by now.  She says the gabapentin is helping, her occipital pain is not  as bad, she responds to nerve blocks, she has spasms and dry needling helped. She has migraines, pulsating, spasms, pounding and thobbing, can be unilateral and start in the occipita area, light and sound sensitivity. Movement makes it worse. Significant neck pain and spasms. Daily headaches for > 2 years and >12 migraine days a month with nausea. Migraines last 24 hours. No aura. No medication overuse.  Tried: gabapentin, elavil(amitriptyline), celebrex, tylenol, topiramate, robaxin, flexeril, propranolol  HPI:  [name] is a 59 y.o. female here as a referral from Dr. No ref. provider found for trigeminal neuralgia.  She has a past medical history spinal stenosis, osteoarthritis, irritable bowel syndrome, high cholesterol, chronic pain, occipital neuralgia, lumbar degenerative disc disease, lumbar spondylosis, failed back surgical syndrome, low back pain,  she is managed for her pain by a pain management clinic.  She is on NabuMetone, oxycodone, lidocaine, gabapentin, amitriptyline, baclofen, topiramate, celexa and Nucynta.  She has had trigeminal decompression in the past. She developed right-sided trigeminal neuralgia in 2005, prior to that the occipital neuralgia. She was diagnosed at [facility] and has seen multiple neurologists and neurosurgeons. They have had her on "everything" she had etensive testing. MRIs, CTs, MRAs and diagnosed with occipital neuralgia. She then developed Trigeminal neuralgia and say a NSY and found blood vessel compressing the trigeminal nerve and she had decompressive surgery, she had MRIs of the trigeminal nerve. She can feel keys. Her baseline level pain is a 4+, baseline is a 4/10 daily and she is at a 6 now since I am typing. She gets regular occipital  blocks. She has also been seen at [facility]. She also saw doctors at [facility]. She has had sphenopalatine blocks. She has taken multiple medications including Tegretol, topamax, lyrica, neurontin, she was on about 18  medications a day. She is currently on multiple medications now.   Currently she gets nagging pain continuously on the right, movement makes it worse, she has shooting pains, spasms radiating up the occipital head, she also shooting pain down the face, exacerbated by wind, shooting pain across the face and eye, the cheek. Worsening since March. Now having left-side symptoms. Light and sounds can trigger headaches. A dark room helps. She has significant light sensitivity, sound sensitivity, movement makes it worse, no nausea or vomiting.   Reviewed notes, labs and imaging from outside physicians, which showed:   Review of records she is had a nerve block of the greater occipital nerve.  She has a history of occipital neuralgia as well as right trigeminal neuralgia.  She has undergone decompressive surgery in [place] for the trigeminal neuralgia.  The occipital neuralgia has responded well to greater occipital nerve injections in the past.  She does not have a local neurologist.  Pain is a 7 out of 10.  Will try to request imaging from institutions she mentions   REVIEW OF SYSTEMS: Out of a complete 14 system review of symptoms, the patient complains only of the following symptoms, headaches, chronic pain, insomnia and all other reviewed systems are negative.  ALLERGIES: Allergies  Allergen Reactions  . Aspirin Other (See Comments), Nausea And Vomiting and Nausea Only    Stomach upset Avoids due to IBS Due to ibs Stomach upset Due to ibs Avoids due to IBS  . Ketorolac Tromethamine Shortness Of Breath, Itching and Other (See Comments)    IM administration ONLY  REDNESS IM administration ONLY  REDNESS  . Penicillins Hives, Itching, Rash and Other (See Comments)    Did it involve swelling of the face/tongue/throat, SOB, or low BP? No Did it involve sudden or severe rash/hives, skin peeling, or any reaction on the inside of your mouth or nose? Yes Did you need to seek medical attention  at a hospital or doctor's office? No When did it last happen?2000 If all above answers are "NO", may proceed with cephalosporin use.  Vaginal rash Vaginal rash Did it involve swelling of the face/tongue/throat, SOB, or low BP? No Did it involve sudden or severe rash/hives, skin peeling, or any reaction on the inside of your mouth or nose? Yes Did you need to seek medical attention at a hospital or doctor's office? No When did it last happen?2000 If all above answers are "NO", may proceed with cephalosporin use. Vaginal rash Vaginal rash  . Codeine Other (See Comments)    Cough meds with codeine-have withdraw symptoms when done Cough meds with codeine-have withdraw symptoms when done Cough meds with codeine-have withdraw symptoms when done  . Ibuprofen Other (See Comments) and Nausea And Vomiting    Stomach upset Avoids due to IBS Stomach upset Avoids due to IBS  . Latex Rash and Other (See Comments)    Vaginal irritation Vaginal irritation Exacerbates genital herpes Exacerbates genital herpes  . Omeprazole Magnesium Other (See Comments)    Lack of therapeutic effect Lack of therapeutic effect  . Pantoprazole Sodium Other (See Comments)    Lack of therapeutic effect Lack of therapeutic effect  . Penicillamine Rash  . Pregabalin Other (See Comments)    Weight gain Weight gain    HOME MEDICATIONS:  Outpatient Medications Prior to Visit  Medication Sig Dispense Refill  . acetaminophen (TYLENOL) 325 MG tablet Take 650 mg by mouth daily.    Marland Kitchen albuterol (VENTOLIN HFA) 108 (90 Base) MCG/ACT inhaler Inhale 2 puffs into the lungs every 6 (six) hours as needed for wheezing or shortness of breath. 18 g 11  . budesonide-formoterol (SYMBICORT) 80-4.5 MCG/ACT inhaler Inhale 2 puffs into the lungs every 12 (twelve) hours. (Patient taking differently: Inhale 2 puffs into the lungs 2 (two) times daily as needed (respiratory issues.). ) 1 Inhaler 5  . Calcium Carb-Cholecalciferol  (CALCIUM + D3 PO) Take 1 tablet by mouth daily.    . celecoxib (CELEBREX) 200 MG capsule Take 200 mg by mouth daily.     . cholecalciferol (VITAMIN D) 25 MCG (1000 UT) tablet Take 1,000 Units by mouth daily.    Marland Kitchen dexlansoprazole (DEXILANT) 60 MG capsule Take 1 capsule (60 mg total) by mouth daily. 30 capsule 3  . dicyclomine (BENTYL) 10 MG capsule Take 2 capsules (20 mg total) by mouth 3 (three) times daily before meals. 180 capsule 0  . fenofibrate (TRICOR) 145 MG tablet Take 1 tablet (145 mg total) by mouth daily. 90 tablet 1  . fluticasone (FLONASE) 50 MCG/ACT nasal spray Place 2 sprays into both nostrils daily. (Patient taking differently: Place 2 sprays into both nostrils 2 (two) times daily. ) 16 g 11  . gabapentin (NEURONTIN) 300 MG capsule Take 1-2 capsules (300-600 mg total) by mouth 3 (three) times daily. (Patient taking differently: Take 600 mg by mouth 3 (three) times daily. ) 180 capsule 11  . loratadine (CLARITIN) 10 MG tablet Take 10 mg by mouth at bedtime.     Marland Kitchen lubiprostone (AMITIZA) 24 MCG capsule Take 1 capsule (24 mcg total) by mouth 2 (two) times daily with a meal. 60 capsule 3  . methocarbamol (ROBAXIN) 500 MG tablet Take 500 mg by mouth at bedtime. Patient is taking half    . montelukast (SINGULAIR) 10 MG tablet Take 1 tablet (10 mg total) by mouth at bedtime. 30 tablet 11  . Probiotic Product (UP4 PROBIOTICS ULTRA) CAPS Take 1 capsule by mouth daily.    Marland Kitchen Spacer/Aero-Holding Chambers (AEROCHAMBER MV) inhaler Use as instructed 1 each 0  . tapentadol (NUCYNTA ER) 100 MG 12 hr tablet Take 100 mg by mouth every 12 (twelve) hours.    . valACYclovir (VALTREX) 500 MG tablet Take 500 mg by mouth 2 (two) times daily as needed (outbreaks).     . Melatonin 10 MG TABS Take 10 mg by mouth at bedtime.     . Simethicone 180 MG CAPS Take 1 capsule (180 mg total) by mouth 2 (two) times daily. 30 capsule 0   No facility-administered medications prior to visit.    PAST MEDICAL  HISTORY: Past Medical History:  Diagnosis Date  . Arthritis 2010  . Asthma   . Chronic headaches   . Chronic pain   . Family history of breast cancer   . Family history of prostate cancer   . Gallstones 2018  . GERD (gastroesophageal reflux disease)   . High cholesterol   . Hx: UTI (urinary tract infection)   . Hypertension   . IBS (irritable bowel syndrome)   . Interstitial cystitis 2012  . Kidney stones    2020-2021  . Migraine   . Occipital neuralgia   . Osteoarthritis   . PNA (pneumonia) 2018  . Spinal stenosis   . Trigeminal neuralgia   . Trigeminal neuralgia  PAST SURGICAL HISTORY: Past Surgical History:  Procedure Laterality Date  . ABDOMINAL HYSTERECTOMY    . ANKLE SURGERY Left 05/2009  . APPENDECTOMY    . BACK SURGERY    . 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 Pershing Proud at [facility]  . BRAIN SURGERY    . CARPAL TUNNEL RELEASE  2014  . CYSTOSCOPY W/ URETERAL STENT PLACEMENT Left 07/25/2019   Procedure: CYSTOSCOPY WITH RETROGRADE PYELOGRAM/URETERAL STENT PLACEMENT;  Surgeon: Franchot Gallo, MD;  Location: [facility];  Service: Urology;  Laterality: Left;  . CYSTOSCOPY W/ URETERAL STENT REMOVAL Left 08/15/2019   Procedure: CYSTOSCOPY WITH STENT REMOVAL;  Surgeon: Franchot Gallo, MD;  Location: [facility];  Service: Urology;  Laterality: Left;  . CYSTOSCOPY WITH RETROGRADE PYELOGRAM, URETEROSCOPY AND STENT PLACEMENT Left 08/15/2019   Procedure: CYSTOSCOPY WITH RETROGRADE PYELOGRAM, URETEROSCOPY;  Surgeon: Franchot Gallo, MD;  Location: [facility];  Service: Urology;  Laterality: Left;  90 MINS  . CYSTOSCOPY WITH RETROGRADE PYELOGRAM, URETEROSCOPY AND STENT PLACEMENT Right 09/12/2019   Procedure: CYSTOSCOPY WITH RIGHT RETROGRADE PYELOGRAM  URETEROSCOPY WITH HOLMIUM LASER STONE EXTRACTION AND STENT PLACEMENT;  Surgeon: Franchot Gallo, MD;  Location: [facility];  Service: Urology;  Laterality: Right;  . HOLMIUM LASER APPLICATION  Left 04/11/8545   Procedure: HOLMIUM LASER APPLICATION;  Surgeon: Franchot Gallo, MD;  Location: [facility];  Service: Urology;  Laterality: Left;  . JOINT REPLACEMENT     R knee  . KIDNEY STONE SURGERY    . OVARIAN CYST REMOVAL  05/2010  . right knee revision  2016  . ROTATOR CUFF REPAIR Right 02/2019  . SPINE SURGERY    . TUBAL LIGATION      FAMILY HISTORY: Family History  Problem Relation Age of Onset  . Ulcers Mother 60       peptic ulcer  . Irritable bowel syndrome Mother   . Prostate cancer Father 7  . Heart disease Father   . Irritable bowel syndrome Father   . Breast cancer Sister 44       ER+/PR+/Her2- breast cancer  . Breast cancer Paternal Grandmother   . Diabetes Paternal Grandmother   . Cancer Paternal Uncle        unknown cancers    SOCIAL HISTORY: Social History   Socioeconomic History  . Marital status: Single    Spouse name: Not on file  . Number of children: 1  . Years of education: Not on file  . Highest education level: Not on file  Occupational History  . Occupation: retired  Tobacco Use  . Smoking status: Never Smoker  . Smokeless tobacco: Never Used  Substance and Sexual Activity  . Alcohol use: Yes    Comment: socially  . Drug use: No  . Sexual activity: Not on file  Other Topics Concern  . Not on file  Social History Narrative  . Not on file   Social Determinants of Health   Financial Resource Strain:   . Difficulty of Paying Living Expenses:   Food Insecurity:   . Worried About Charity fundraiser in the Last Year:   . Arboriculturist in the Last Year:   Transportation Needs:   . Film/video editor (Medical):   Marland Kitchen Lack of Transportation (Non-Medical):   Physical Activity:   . Days of Exercise per Week:   . Minutes of Exercise per Session:   Stress:   . Feeling of Stress :   Social Connections:   .  Frequency of Communication with Friends and Family:   . Frequency of Social Gatherings with Friends and Family:   . Attends  Religious Services:   . Active Member of Clubs or Organizations:   . Attends Archivist Meetings:   Marland Kitchen Marital Status:   Intimate Partner Violence:   . Fear of Current or Ex-Partner:   . Emotionally Abused:   Marland Kitchen Physically Abused:   . Sexually Abused:       PHYSICAL EXAM  Vitals:   01/08/20 1009  BP: (!) 146/88  Pulse: 98  Weight: 166 lb (75.3 kg)  Height: 5' 2.5" (1.588 m)   Body mass index is 29.88 kg/m.  Generalized: Well developed, in no acute distress  Neurological examination  Mentation: Alert oriented to time, place, history taking. Follows all commands speech and language fluent Cranial nerve II-XII: Pupils were equal round reactive to light. Extraocular movements were full, visual field were full on confrontational test. Facial sensation and strength were normal.  Motor: The motor testing reveals 5 over 5 strength of all 4 extremities. Good symmetric motor tone is noted throughout. Gait and station: Gait is normal.   DIAGNOSTIC DATA (LABS, IMAGING, TESTING) - I reviewed patient records, labs, notes, testing and imaging myself where available.  No flowsheet data found.   Lab Results  Component Value Date   WBC 7.0 09/11/2019   HGB 11.5 (L) 09/11/2019   HCT 35.4 (L) 09/11/2019   MCV 96.7 09/11/2019   PLT 340 09/11/2019      Component Value Date/Time   NA 149 (H) 12/09/2019 1616   K 4.2 12/09/2019 1616   CL 110 (H) 12/09/2019 1616   CO2 19 (L) 12/09/2019 1616   GLUCOSE 84 12/09/2019 1616   GLUCOSE 93 09/11/2019 1434   BUN 19 12/09/2019 1616   CREATININE 1.19 (H) 12/09/2019 1616   CALCIUM 10.4 (H) 12/09/2019 1616   PROT 8.2 12/09/2019 1616   ALBUMIN 4.7 12/09/2019 1616   AST 30 12/09/2019 1616   ALT 16 12/09/2019 1616   ALKPHOS 59 12/09/2019 1616   BILITOT 0.2 12/09/2019 1616   GFRNONAA 51 (L) 12/09/2019 1616   GFRAA 59 (L) 12/09/2019 1616   Lab Results  Component Value Date   CHOL 176 12/25/2019   HDL 72 12/25/2019   LDLCALC 91  12/25/2019   TRIG 67 12/25/2019   CHOLHDL 2.4 12/25/2019   No results found for: HGBA1C No results found for: VITAMINB12 No results found for: TSH     ASSESSMENT AND PLAN 59 y.o. year old female  has a past medical history of Arthritis (2010), Asthma, Chronic headaches, Chronic pain, Family history of breast cancer, Family history of prostate cancer, Gallstones (2018), GERD (gastroesophageal reflux disease), High cholesterol, UTI (urinary tract infection), Hypertension, IBS (irritable bowel syndrome), Interstitial cystitis (2012), Kidney stones, Migraine, Occipital neuralgia, Osteoarthritis, PNA (pneumonia) (2018), Spinal stenosis, Trigeminal neuralgia, and Trigeminal neuralgia. here with     ICD-10-CM   1. Chronic migraine without aura, with intractable migraine, so stated, with status migrainosus  G43.711   2. Occipital neuralgia of right side  M54.81   3. Trigeminal neuralgia of right side of face  G50.0      Dorissa is doing fairly well today. Migraines are well managed on Botox and gabapentin. She continues to have intermittent pain of occipital region. Trigeminal pain well managed at this time. She has weaned topiramate and amitriptyline. We will monitor symptoms closely. We will repeat occipital nerve block today, see procedure note. Will  continue Botox every 12 weeks, next procedure in 02/2020.  Healthy lifestyle habits encouraged. She will stay well hydrated. Will consider restarting amitriptyline or adding CGRP in future if migraines/neuralgias worsen. She will follow up in the office in 1 year, sooner if needed. She verbalizes understanding and agreement with this plan.    No orders of the defined types were placed in this encounter.    No orders of the defined types were placed in this encounter.     I spent 25 minutes with the patient. 50% of this time was spent counseling and educating patient on plan of care and medications.    Debbora Presto, FNP-C 01/08/2020, 10:19  AM [facility] [address], [place], [place] [address] [phone]

## 2020-01-08 NOTE — Progress Notes (Signed)
Xylocaine 2% NDC [id number] exp 02/2021 (3cc)  Bupivacaine 0.5%  [id number] Batch: [id number] exp 09/2020.(3cc)

## 2020-01-08 NOTE — Progress Notes (Addendum)
History: see office note        Bupivicaine injection/Lidocaine protocol for occipital neuralgia   Performed by Debbora Presto, FNP.30-gauge needle was used. All procedures as documented were medically necessary, reasonable and appropriate based on the patient's history, medical diagnosis and physician opinion. Verbal informed consent was obtained from the patient, patient was informed of potential risk of procedure, including bruising, bleeding, hematoma formation, infection, muscle weakness, muscle pain, numbness, transient hypertension, transient hyperglycemia and transient insomnia among others. All areas injected were topically clean with isopropyl rubbing alcohol. Nonsterile nonlatex gloves were worn during the procedure.   1. Greater occipital nerve block [phone]). The greater occipital nerve site was identified at the nuchal line medial to the occipital artery. Medication was injected into the left and right occipital nerve areas and suboccipital areas. Patient's condition is associated with inflammation of the greater occipital nerve and associated multiple groups. Injection was deemed medically necessary, reasonable and appropriate. Injection represents a separate and unique surgical service.    The patient tolerated the injections well, no complications of the procedure were noted. Injections were made with a 30-gauge needle. 73ml used of 2% xylocaine and 0.5% bupivacaine mixed 1:1 ratio.   Made any corrections needed, and agree with procedure  Sarina Ill, MD [facility]

## 2020-01-08 NOTE — Telephone Encounter (Signed)
Noted. I sent patient a MyChart message regarding the issue.

## 2020-01-08 NOTE — Telephone Encounter (Signed)
I called patient after I sent message in MyChart to offer her 7/8 at 7:45 with Amy. Patient states she would take this appointment.

## 2020-01-24 ENCOUNTER — Other Ambulatory Visit: Payer: Self-pay | Admitting: Gastroenterology

## 2020-01-28 ENCOUNTER — Telehealth: Payer: Self-pay | Admitting: *Deleted

## 2020-01-28 MED ORDER — AMITRIPTYLINE HCL 50 MG PO TABS: 50.0000 mg | ORAL_TABLET | Freq: Every day | ORAL | 0 refills | Status: DC

## 2020-01-28 MED ORDER — NURTEC 75 MG PO TBDP: 75.0000 mg | ORAL_TABLET | Freq: Every day | ORAL | 0 refills | Status: DC | PRN

## 2020-01-28 NOTE — Telephone Encounter (Signed)
I spoke to pt and relayed the plan for restarting the amitriptyline (script to [facility] her location) and NURTEC for acute abortive which may require PA.  I will go ahead and get started. She may need to go to urgent care if gets so bad.  She verbalized understanding.

## 2020-01-28 NOTE — Telephone Encounter (Signed)
Patient called & LVM today at 8:41 AM. Pt stated she has had a headache x 4 days along with nausea. She has taken Excedrin and extra strength Tylenol which gives her relief momentarily but then the headache returns sometimes worse than before. She feels it's tension related. She would like a call back for advice as she is currently out of town. Call back # [phone].   *I am checking VMs for the clinic.

## 2020-01-28 NOTE — Addendum Note (Signed)
Addended by: Brandon Melnick on: 01/28/2020 04:26 PM   Modules accepted: Orders

## 2020-01-28 NOTE — Addendum Note (Signed)
Addended by: Debbora Presto L on: 01/28/2020 04:57 PM   Modules accepted: Orders

## 2020-01-28 NOTE — Telephone Encounter (Signed)
I called pt and she is having migraines 4 days (frontal and bak of head, level 8, with nausea some, waxes and waines, but never goes away.  She feels like it is tension headache.  She is not on amitriptyline 50mg  po qhs. at this time due to forgot this. She is down in [place] now taking care of her sister.

## 2020-01-29 ENCOUNTER — Other Ambulatory Visit: Payer: Self-pay | Admitting: Gastroenterology

## 2020-01-29 NOTE — Telephone Encounter (Signed)
I called and spoke to [facility] with [place] and she was able to see that pt did receive NURTEC  medication  $30 for 30 day or $60 for 90 day supply.

## 2020-01-29 NOTE — Telephone Encounter (Signed)
Initiated [place] for Nurtec 75mg  tablets #10 take one daily onset migraine (max one daily).  G43.709 and M54.81. Tried no triptans, ( amitriptyline, topiramate, gabapentin, lyrica, celexa. (attached last note).

## 2020-02-06 ENCOUNTER — Ambulatory Visit: Payer: Medicare Other | Admitting: Family Medicine

## 2020-02-08 ENCOUNTER — Other Ambulatory Visit: Payer: Self-pay | Admitting: Gastroenterology

## 2020-02-11 ENCOUNTER — Telehealth: Payer: Self-pay | Admitting: Family Medicine

## 2020-02-11 NOTE — Telephone Encounter (Signed)
At 3:34p pt left message ask to be called by Lovena Le to reschedule Botox. (noted by Shanda Howells.)

## 2020-02-12 NOTE — Telephone Encounter (Signed)
I returned patient's call and rescheduled her appointment because she is out of town and will not return until 7/10. I rescheduled for 8/19 at 7:45, explained this is all I could find for now. Patient is okay with this. She states she is still having a lot of headache pain which can not be relieved by Excedrin. She hopes Nurtec will help. I let her know I would keep an eye on the schedule for any cancellations. She verbalized understanding and appreciation.

## 2020-02-12 NOTE — Telephone Encounter (Signed)
Pt called back and asked to be called at [phone]

## 2020-02-13 ENCOUNTER — Ambulatory Visit: Payer: Medicare Other | Admitting: Family Medicine

## 2020-02-17 NOTE — Telephone Encounter (Signed)
Noted  

## 2020-02-17 NOTE — Progress Notes (Signed)
February 18, 2020: Here for follow up of migraines. Botox really helped, last botox 11/06/2019, she missed recent botox, She started having headaches again 01/28/2020 when she was due for botox. Severe headache since then tried nurtec. When she is treated with botox she has >85% decrease in frequency and severity. Will perform botox today, take nurtec daily until botox kicks in again.   **Doing EXCELLENT on botox but still has leftover headaches will see if we can get emgality approved concomitant with the botox.  Consent Form Botulism Toxin Injection For Chronic Migraine    Reviewed orally with patient, additionally signature is on file:  Botulism toxin has been approved by the Federal drug administration for treatment of chronic migraine. Botulism toxin does not cure chronic migraine and it may not be effective in some patients.  The administration of botulism toxin is accomplished by injecting a small amount of toxin into the muscles of the neck and head. Dosage must be titrated for each individual. Any benefits resulting from botulism toxin tend to wear off after 3 months with a repeat injection required if benefit is to be maintained. Injections are usually done every 3-4 months with maximum effect peak achieved by about 2 or 3 weeks. Botulism toxin is expensive and you should be sure of what costs you will incur resulting from the injection.  The side effects of botulism toxin use for chronic migraine may include:   -Transient, and usually mild, facial weakness with facial injections  -Transient, and usually mild, head or neck weakness with head/neck injections  -Reduction or loss of forehead facial animation due to forehead muscle weakness  -Eyelid drooping  -Dry eye  -Pain at the site of injection or bruising at the site of injection  -Double vision  -Potential unknown long term risks  Contraindications: You should not have Botox if you are pregnant, nursing, allergic to albumin, have an  infection, skin condition, or muscle weakness at the site of the injection, or have myasthenia gravis, Lambert-Eaton syndrome, or ALS.  It is also possible that as with any injection, there may be an allergic reaction or no effect from the medication. Reduced effectiveness after repeated injections is sometimes seen and rarely infection at the injection site may occur. All care will be taken to prevent these side effects. If therapy is given over a long time, atrophy and wasting in the muscle injected may occur. Occasionally the patient's become refractory to treatment because they develop antibodies to the toxin. In this event, therapy needs to be modified.  I have read the above information and consent to the administration of botulism toxin.    BOTOX PROCEDURE NOTE FOR MIGRAINE HEADACHE    Contraindications and precautions discussed with patient(above). Aseptic procedure was observed and patient tolerated procedure. Procedure performed by Dr. Georgia Dom  The condition has existed for more than 6 months, and pt does not have a diagnosis of ALS, Myasthenia Gravis or Lambert-Eaton Syndrome.  Risks and benefits of injections discussed and pt agrees to proceed with the procedure.  Written consent obtained  These injections are medically necessary. Pt  receives good benefits from these injections. These injections do not cause sedations or hallucinations which the oral therapies may cause.  Description of procedure:  The patient was placed in a sitting position. The standard protocol was used for Botox as follows, with 5 units of Botox injected at each site:   -Procerus muscle, midline injection  -Corrugator muscle, bilateral injection  -Frontalis muscle, bilateral injection, with  2 sites each side, medial injection was performed in the upper one third of the frontalis muscle, in the region vertical from the medial inferior edge of the superior orbital rim. The lateral injection was again in  the upper one third of the forehead vertically above the lateral limbus of the cornea, 1.5 cm lateral to the medial injection site.  -Temporalis muscle injection, 4 sites, bilaterally. The first injection was 3 cm above the tragus of the ear, second injection site was 1.5 cm to 3 cm up from the first injection site in line with the tragus of the ear. The third injection site was 1.5-3 cm forward between the first 2 injection sites. The fourth injection site was 1.5 cm posterior to the second injection site.   -Occipitalis muscle injection, 3 sites, bilaterally. The first injection was done one half way between the occipital protuberance and the tip of the mastoid process behind the ear. The second injection site was done lateral and superior to the first, 1 fingerbreadth from the first injection. The third injection site was 1 fingerbreadth superiorly and medially from the first injection site.  -Cervical paraspinal muscle injection, 2 sites, bilateral knee first injection site was 1 cm from the midline of the cervical spine, 3 cm inferior to the lower border of the occipital protuberance. The second injection site was 1.5 cm superiorly and laterally to the first injection site.  -Trapezius muscle injection was performed at 3 sites, bilaterally. The first injection site was in the upper trapezius muscle halfway between the inflection point of the neck, and the acromion. The second injection site was one half way between the acromion and the first injection site. The third injection was done between the first injection site and the inflection point of the neck.   Will return for repeat injection in 3 months.   200 units of Botox was used, any Botox not injected was wasted. The patient tolerated the procedure well, there were no complications of the above procedure.

## 2020-02-17 NOTE — Telephone Encounter (Signed)
I called patient to offer her an appointment for Botox with Amy on 7/26. Patient states she would like to see Dr. Jaynee Eagles about her daily headaches before her next Botox injection. I offered her tomorrow at [place]

## 2020-02-18 ENCOUNTER — Ambulatory Visit (INDEPENDENT_AMBULATORY_CARE_PROVIDER_SITE_OTHER): Payer: Medicare Other | Admitting: Neurology

## 2020-02-18 ENCOUNTER — Encounter: Payer: Self-pay | Admitting: Neurology

## 2020-02-18 ENCOUNTER — Other Ambulatory Visit: Payer: Self-pay

## 2020-02-18 VITALS — BP 142/94 | HR 121 | Ht 62.5 in | Wt 174.0 lb

## 2020-02-18 DIAGNOSIS — G43711 Chronic migraine without aura, intractable, with status migrainosus: Secondary | ICD-10-CM

## 2020-02-18 MED ORDER — EMGALITY 120 MG/ML ~~LOC~~ SOAJ: 120.0000 mg | SUBCUTANEOUS | 11 refills | Status: DC

## 2020-02-18 NOTE — Progress Notes (Signed)
Botox consent signed today Botox- 200 units x 1 vial Lot: [id number] Expiration:11/2022 NDC: 0023-3921-02  Bacteriostatic 0.9% Sodium Chloride- 66mL total Lot: [id number] Expiration: 05/08/2021 NDC: 4446-1901-22  Dx: U41.146 B/B

## 2020-02-20 ENCOUNTER — Encounter (HOSPITAL_COMMUNITY): Payer: Self-pay

## 2020-02-20 ENCOUNTER — Encounter (HOSPITAL_COMMUNITY): Payer: Medicare Other

## 2020-02-20 ENCOUNTER — Ambulatory Visit (HOSPITAL_COMMUNITY): Payer: Medicare Other | Attending: Orthopedic Surgery

## 2020-02-21 ENCOUNTER — Other Ambulatory Visit: Payer: Self-pay | Admitting: Physician Assistant

## 2020-02-21 ENCOUNTER — Other Ambulatory Visit: Payer: Self-pay | Admitting: Gastroenterology

## 2020-02-21 DIAGNOSIS — M25511 Pain in right shoulder: Secondary | ICD-10-CM

## 2020-02-24 ENCOUNTER — Other Ambulatory Visit: Payer: Self-pay | Admitting: Gastroenterology

## 2020-02-25 ENCOUNTER — Ambulatory Visit: Payer: Self-pay

## 2020-02-25 DIAGNOSIS — T782XXA Anaphylactic shock, unspecified, initial encounter: Secondary | ICD-10-CM | POA: Diagnosis not present

## 2020-02-25 DIAGNOSIS — T50995A Adverse effect of other drugs, medicaments and biological substances, initial encounter: Secondary | ICD-10-CM | POA: Diagnosis not present

## 2020-02-25 NOTE — Telephone Encounter (Signed)
Pt. Reports she took Toradol at 0400 this morning and began having facial swelling and itchy throat. Took 75 mg of Benadryl, "which has helped me some. The itching all over is better." Lips and eyes continue to swell. Instructed to go to ED now. Refuses. Wanted Dr. Nolon Rod new number. Pt. Given number as requested.  Reason for Disposition . [1] SEVERE swelling of entire face AND [2] < 2 hours since exposure to high-risk allergen (e.g., peanuts, tree nuts, fish, shellfish or 1st dose of drug) AND [3] no serious symptoms AND [4] no serious allergic reaction in the past  Answer Assessment - Initial Assessment Questions 1. ONSET: "When did the swelling start?" (e.g., minutes, hours, days)     This morning 2. LOCATION: "What part of the face is swollen?"     Whole face 3. SEVERITY: "How swollen is it?"     Severe 4. ITCHING: "Is there any itching?" If Yes, ask: "How much?"   (Scale 1-10; mild, moderate or severe)     Moderate 5. PAIN: "Is the swelling painful to touch?" If Yes, ask: "How painful is it?"   (Scale 1-10; mild, moderate or severe)     No 6. FEVER: "Do you have a fever?" If Yes, ask: "What is it, how was it measured, and when did it start?"      No 7. CAUSE: "What do you think is causing the face swelling?"     Medication 8. RECURRENT SYMPTOM: "Have you had face swelling before?" If Yes, ask: "When was the last time?" "What happened that time?"     No 9. OTHER SYMPTOMS: "Do you have any other symptoms?" (e.g., toothache, leg swelling)     No 10. PREGNANCY: "Is there any chance you are pregnant?" "When was your last menstrual period?"       No  Protocols used: [facility]

## 2020-02-27 ENCOUNTER — Other Ambulatory Visit: Payer: Self-pay | Admitting: Gastroenterology

## 2020-02-27 ENCOUNTER — Telehealth: Payer: Self-pay | Admitting: Gastroenterology

## 2020-02-27 MED ORDER — DEXILANT 60 MG PO CPDR: 60.0000 mg | DELAYED_RELEASE_CAPSULE | Freq: Every day | ORAL | 3 refills | Status: DC

## 2020-02-27 NOTE — Telephone Encounter (Signed)
Called patient again to inform her at 4:31pm her pharmacy sent [place] another refill request electronically for pantoprazole(Protonix)   Which I denied as refill not appropriate  Pt stated she will contact [facility]

## 2020-02-27 NOTE — Telephone Encounter (Signed)
Called patient back scheduled her a follow up for 9/29 to discuss her medications and "Next Step" in treatment plan  Patient was upset because protonix was sent to her pharmacy instead of dexilant  After reviewing it seems that on 7/16 pharmacy sent [place] a electronic refill request for dexilant which I filled for her... Then on  7/19 they sent another refill request for her for protonix which I also filled assuming she had changed her dexilant to protonix ,   Because the dexilant is not covered the  pharmacy sent refill request automatically for her prevoius PPI Protonix per the Pharmacy Tech because insurance covers [employer] and done a prior authorization   Approved Case number is [id number]

## 2020-02-27 NOTE — Telephone Encounter (Signed)
She will need follow up visit to reassess her symptoms and discuss further management, will not be possible to do with a telephone encounter. Agree with Dexilant refills.Thanks

## 2020-02-27 NOTE — Telephone Encounter (Signed)
Dr Silverio Decamp please advise, she wants to know whats next in her treatment plan??    Dexilant 60 mg sent to patients pharmacy  Removed protonix off her med list

## 2020-03-02 ENCOUNTER — Ambulatory Visit: Payer: Medicare Other | Admitting: Family Medicine

## 2020-03-04 ENCOUNTER — Other Ambulatory Visit: Payer: Self-pay | Admitting: Gastroenterology

## 2020-03-04 DIAGNOSIS — M25511 Pain in right shoulder: Secondary | ICD-10-CM | POA: Diagnosis not present

## 2020-03-04 DIAGNOSIS — M7501 Adhesive capsulitis of right shoulder: Secondary | ICD-10-CM | POA: Diagnosis not present

## 2020-03-09 ENCOUNTER — Ambulatory Visit: Payer: Medicare Other | Admitting: Registered Nurse

## 2020-03-10 ENCOUNTER — Encounter: Payer: Self-pay | Admitting: Registered Nurse

## 2020-03-10 ENCOUNTER — Ambulatory Visit (INDEPENDENT_AMBULATORY_CARE_PROVIDER_SITE_OTHER): Payer: Medicare Other | Admitting: Registered Nurse

## 2020-03-10 ENCOUNTER — Other Ambulatory Visit: Payer: Self-pay

## 2020-03-10 VITALS — BP 129/89 | HR 112 | Temp 97.6°F | Resp 18 | Ht 62.5 in | Wt 174.8 lb

## 2020-03-10 DIAGNOSIS — G510 Bell's palsy: Secondary | ICD-10-CM | POA: Diagnosis not present

## 2020-03-10 MED ORDER — VALACYCLOVIR HCL 1 G PO TABS: 1000.0000 mg | ORAL_TABLET | Freq: Three times a day (TID) | ORAL | 0 refills | Status: DC

## 2020-03-10 MED ORDER — PREDNISONE 10 MG (21) PO TBPK: ORAL_TABLET | ORAL | 0 refills | Status: DC

## 2020-03-10 NOTE — Progress Notes (Signed)
Acute Office Visit  Subjective:    Patient ID: Cassidy Olsen, female    DOB: [birth year], 59 y.o.   MRN: [id number]  Chief Complaint  Patient presents with  . Allergic Reaction    Patient states she tooke Ketorolac Tromethamine 02/25/2020 and they thought she could take them orally because she thought it was only the IM medication she was allergic too, Patient states she had to Urgent care symptoms of  whole body was red, wheezing, itching and swollen face. Patient was given Prednisone the swelling went down but the left side is now drooping down.    HPI Patient is in today for facial paralysis.  Had a recent dose of PO ketorolac - had had allergic rxn to IM ketorolac in the past, had an anaphylactic type reaction. Was seen by urgent care and given steroid taper. Now having some facial paralysis.   Mouth is most affected. Can close eye entirely. No sensory changes. No pain. Headaches baseline. No peripheral nervous system symptoms - denies weakness, numbness, tingling in hands and feet, no slurred speech, no cognitive deficits.   Past Medical History:  Diagnosis Date  . Arthritis 2010  . Asthma   . Chronic headaches   . Chronic pain   . Family history of breast cancer   . Family history of prostate cancer   . Gallstones 2018  . GERD (gastroesophageal reflux disease)   . High cholesterol   . Hx: UTI (urinary tract infection)   . Hypertension   . IBS (irritable bowel syndrome)   . Interstitial cystitis 2012  . Kidney stones    2020-2021  . Migraine   . Occipital neuralgia   . Osteoarthritis   . PNA (pneumonia) 2018  . Spinal stenosis   . Trigeminal neuralgia   . Trigeminal neuralgia     Past Surgical History:  Procedure Laterality Date  . ABDOMINAL HYSTERECTOMY    . ANKLE SURGERY Left 05/2009  . APPENDECTOMY    . BACK SURGERY    . 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 Pershing Proud [facility]  . BRAIN SURGERY    . CARPAL  TUNNEL RELEASE  2014  . CYSTOSCOPY W/ URETERAL STENT PLACEMENT Left 07/25/2019   Procedure: CYSTOSCOPY WITH RETROGRADE PYELOGRAM/URETERAL STENT PLACEMENT;  Surgeon: Franchot Gallo, MD;  Location: [facility];  Service: Urology;  Laterality: Left;  . CYSTOSCOPY W/ URETERAL STENT REMOVAL Left 08/15/2019   Procedure: CYSTOSCOPY WITH STENT REMOVAL;  Surgeon: Franchot Gallo, MD;  Location: [facility];  Service: Urology;  Laterality: Left;  . CYSTOSCOPY WITH RETROGRADE PYELOGRAM, URETEROSCOPY AND STENT PLACEMENT Left 08/15/2019   Procedure: CYSTOSCOPY WITH RETROGRADE PYELOGRAM, URETEROSCOPY;  Surgeon: Franchot Gallo, MD;  Location: [facility];  Service: Urology;  Laterality: Left;  90 MINS  . CYSTOSCOPY WITH RETROGRADE PYELOGRAM, URETEROSCOPY AND STENT PLACEMENT Right 09/12/2019   Procedure: CYSTOSCOPY WITH RIGHT RETROGRADE PYELOGRAM  URETEROSCOPY WITH HOLMIUM LASER STONE EXTRACTION AND STENT PLACEMENT;  Surgeon: Franchot Gallo, MD;  Location: [facility];  Service: Urology;  Laterality: Right;  . HOLMIUM LASER APPLICATION Left 11/11/2861   Procedure: HOLMIUM LASER APPLICATION;  Surgeon: Franchot Gallo, MD;  Location: [facility];  Service: Urology;  Laterality: Left;  . JOINT REPLACEMENT     R knee  . KIDNEY STONE SURGERY    . OVARIAN CYST REMOVAL  05/2010  . right knee revision  2016  . ROTATOR CUFF REPAIR Right 02/2019  . SPINE SURGERY    . TUBAL  LIGATION      Family History  Problem Relation Age of Onset  . Ulcers Mother 25       peptic ulcer  . Irritable bowel syndrome Mother   . Prostate cancer Father 49  . Heart disease Father   . Irritable bowel syndrome Father   . Breast cancer Sister 55       ER+/PR+/Her2- breast cancer  . Breast cancer Paternal Grandmother   . Diabetes Paternal Grandmother   . Cancer Paternal Uncle        unknown cancers    Social History   Socioeconomic History  . Marital status: Single    Spouse name: Not on file  . Number of children: 1  . Years of education:  Not on file  . Highest education level: Not on file  Occupational History  . Occupation: retired  Tobacco Use  . Smoking status: Never Smoker  . Smokeless tobacco: Never Used  Vaping Use  . Vaping Use: Never used  Substance and Sexual Activity  . Alcohol use: Yes    Comment: socially  . Drug use: No  . Sexual activity: Not on file  Other Topics Concern  . Not on file  Social History Narrative  . Not on file   Social Determinants of Health   Financial Resource Strain:   . Difficulty of Paying Living Expenses:   Food Insecurity:   . Worried About Charity fundraiser in the Last Year:   . Arboriculturist in the Last Year:   Transportation Needs:   . Film/video editor (Medical):   Marland Kitchen Lack of Transportation (Non-Medical):   Physical Activity:   . Days of Exercise per Week:   . Minutes of Exercise per Session:   Stress:   . Feeling of Stress :   Social Connections:   . Frequency of Communication with Friends and Family:   . Frequency of Social Gatherings with Friends and Family:   . Attends Religious Services:   . Active Member of Clubs or Organizations:   . Attends Archivist Meetings:   Marland Kitchen Marital Status:   Intimate Partner Violence:   . Fear of Current or Ex-Partner:   . Emotionally Abused:   Marland Kitchen Physically Abused:   . Sexually Abused:     Outpatient Medications Prior to Visit  Medication Sig Dispense Refill  . acetaminophen (TYLENOL) 325 MG tablet Take 650 mg by mouth daily.    Marland Kitchen albuterol (VENTOLIN HFA) 108 (90 Base) MCG/ACT inhaler Inhale 2 puffs into the lungs every 6 (six) hours as needed for wheezing or shortness of breath. 18 g 11  . amitriptyline (ELAVIL) 50 MG tablet Take 1 tablet (50 mg total) by mouth at bedtime. 30 tablet 0  . budesonide-formoterol (SYMBICORT) 80-4.5 MCG/ACT inhaler Inhale 2 puffs into the lungs every 12 (twelve) hours. (Patient taking differently: Inhale 2 puffs into the lungs 2 (two) times daily as needed (respiratory  issues.). ) 1 Inhaler 5  . Calcium Carb-Cholecalciferol (CALCIUM + D3 PO) Take 1 tablet by mouth daily.    . celecoxib (CELEBREX) 200 MG capsule Take 200 mg by mouth daily.     . cholecalciferol (VITAMIN D) 25 MCG (1000 UT) tablet Take 1,000 Units by mouth daily.    Marland Kitchen dexlansoprazole (DEXILANT) 60 MG capsule Take 1 capsule (60 mg total) by mouth daily. 90 capsule 3  . dicyclomine (BENTYL) 10 MG capsule TAKE 2 CAPSULES BY MOUTH 3 TIMES DAILY BEFORE MEALS. 180 capsule 0  .  fenofibrate (TRICOR) 145 MG tablet Take 1 tablet (145 mg total) by mouth daily. 90 tablet 1  . fluticasone (FLONASE) 50 MCG/ACT nasal spray Place 2 sprays into both nostrils daily. (Patient taking differently: Place 2 sprays into both nostrils 2 (two) times daily. ) 16 g 11  . gabapentin (NEURONTIN) 300 MG capsule Take 1-2 capsules (300-600 mg total) by mouth 3 (three) times daily. (Patient taking differently: Take 600 mg by mouth 3 (three) times daily. ) 180 capsule 11  . Galcanezumab-gnlm (EMGALITY) 120 MG/ML SOAJ Inject 120 mg into the skin every 30 (thirty) days. 1 pen 11  . lubiprostone (AMITIZA) 24 MCG capsule Take 1 capsule (24 mcg total) by mouth 2 (two) times daily with a meal. 60 capsule 3  . methocarbamol (ROBAXIN) 500 MG tablet Take 500 mg by mouth at bedtime. Patient is taking half    . montelukast (SINGULAIR) 10 MG tablet Take 1 tablet (10 mg total) by mouth at bedtime. 30 tablet 11  . Probiotic Product (UP4 PROBIOTICS ULTRA) CAPS Take 1 capsule by mouth daily.    . Rimegepant Sulfate (NURTEC) 75 MG TBDP Take 75 mg by mouth daily as needed (take for abortive therapy of migraine, no more than 1 tablet in 24 hours or 10 per month). 10 tablet 0  . Spacer/Aero-Holding Chambers (AEROCHAMBER MV) inhaler Use as instructed 1 each 0  . tapentadol (NUCYNTA ER) 100 MG 12 hr tablet Take 100 mg by mouth every 12 (twelve) hours.    . valACYclovir (VALTREX) 500 MG tablet Take 500 mg by mouth 2 (two) times daily as needed (outbreaks).      Marland Kitchen loratadine (CLARITIN) 10 MG tablet Take 10 mg by mouth at bedtime.  (Patient not taking: Reported on 03/10/2020)     No facility-administered medications prior to visit.    Allergies  Allergen Reactions  . Aspirin Other (See Comments), Nausea And Vomiting and Nausea Only    Stomach upset Avoids due to IBS Due to ibs Stomach upset Due to ibs Avoids due to IBS  . Ketorolac Tromethamine Shortness Of Breath, Itching and Other (See Comments)    IM administration ONLY  REDNESS IM administration ONLY  REDNESS  SWELLING OF THE THROAT   . Penicillins Hives, Itching, Rash and Other (See Comments)    Did it involve swelling of the face/tongue/throat, SOB, or low BP? No Did it involve sudden or severe rash/hives, skin peeling, or any reaction on the inside of your mouth or nose? Yes Did you need to seek medical attention at a hospital or doctor's office? No When did it last happen?2000 If all above answers are "NO", may proceed with cephalosporin use.  Vaginal rash Vaginal rash Did it involve swelling of the face/tongue/throat, SOB, or low BP? No Did it involve sudden or severe rash/hives, skin peeling, or any reaction on the inside of your mouth or nose? Yes Did you need to seek medical attention at a hospital or doctor's office? No When did it last happen?2000 If all above answers are "NO", may proceed with cephalosporin use. Vaginal rash Vaginal rash  . Codeine Other (See Comments)    Cough meds with codeine-have withdraw symptoms when done Cough meds with codeine-have withdraw symptoms when done Cough meds with codeine-have withdraw symptoms when done  . Ibuprofen Other (See Comments) and Nausea And Vomiting    Stomach upset Avoids due to IBS Stomach upset Avoids due to IBS  . Latex Rash and Other (See Comments)    Vaginal irritation  Vaginal irritation Exacerbates genital herpes Exacerbates genital herpes  . Omeprazole Magnesium Other (See Comments)     Lack of therapeutic effect Lack of therapeutic effect  . Pantoprazole Sodium Other (See Comments)    Lack of therapeutic effect Lack of therapeutic effect  . Penicillamine Rash  . Pregabalin Other (See Comments)    Weight gain Weight gain    Review of Systems Per hpi      Objective:    Physical Exam Vitals and nursing note reviewed.  Constitutional:      General: She is not in acute distress.    Appearance: Normal appearance. She is obese. She is not ill-appearing, toxic-appearing or diaphoretic.  HENT:     Right Ear: Tympanic membrane, ear canal and external ear normal.     Left Ear: Tympanic membrane, ear canal and external ear normal.  Cardiovascular:     Rate and Rhythm: Normal rate and regular rhythm.     Pulses: Normal pulses.     Heart sounds: Normal heart sounds. No murmur heard.  No friction rub. No gallop.   Pulmonary:     Effort: Pulmonary effort is normal. No respiratory distress.     Breath sounds: Normal breath sounds.  Musculoskeletal:        General: No swelling, tenderness, deformity or signs of injury. Normal range of motion.     Right lower leg: No edema.     Left lower leg: No edema.  Skin:    General: Skin is warm and dry.     Capillary Refill: Capillary refill takes less than 2 seconds.     Coloration: Skin is not jaundiced or pale.     Findings: No bruising, erythema, lesion or rash.  Neurological:     General: No focal deficit present.     Mental Status: She is alert and oriented to person, place, and time. Mental status is at baseline.     Cranial Nerves: Cranial nerve deficit (L side facial paralysis) present.     Sensory: No sensory deficit.     Motor: No weakness.     Coordination: Coordination normal.     Gait: Gait normal.     Deep Tendon Reflexes: Reflexes normal.  Psychiatric:        Mood and Affect: Mood normal.        Behavior: Behavior normal.        Thought Content: Thought content normal.        Judgment: Judgment normal.      BP 129/89   Pulse (!) 112   Temp 97.6 F (36.4 C) (Temporal)   Resp 18   Ht 5' 2.5" (1.588 m)   Wt 174 lb 12.8 oz (79.3 kg)   SpO2 96%   BMI 31.46 kg/m  Wt Readings from Last 3 Encounters:  03/10/20 174 lb 12.8 oz (79.3 kg)  02/18/20 174 lb (78.9 kg)  01/08/20 166 lb (75.3 kg)    Health Maintenance Due  Topic Date Due  . INFLUENZA VACCINE  03/08/2020    There are no preventive care reminders to display for this patient.   No results found for: TSH Lab Results  Component Value Date   WBC 7.0 09/11/2019   HGB 11.5 (L) 09/11/2019   HCT 35.4 (L) 09/11/2019   MCV 96.7 09/11/2019   PLT 340 09/11/2019   Lab Results  Component Value Date   NA 149 (H) 12/09/2019   K 4.2 12/09/2019   CO2 19 (L) 12/09/2019   GLUCOSE 84  12/09/2019   BUN 19 12/09/2019   CREATININE 1.19 (H) 12/09/2019   BILITOT 0.2 12/09/2019   ALKPHOS 59 12/09/2019   AST 30 12/09/2019   ALT 16 12/09/2019   PROT 8.2 12/09/2019   ALBUMIN 4.7 12/09/2019   CALCIUM 10.4 (H) 12/09/2019   ANIONGAP 6 09/11/2019   Lab Results  Component Value Date   CHOL 176 12/25/2019   Lab Results  Component Value Date   HDL 72 12/25/2019   Lab Results  Component Value Date   LDLCALC 91 12/25/2019   Lab Results  Component Value Date   TRIG 67 12/25/2019   Lab Results  Component Value Date   CHOLHDL 2.4 12/25/2019   No results found for: HGBA1C     Assessment & Plan:   Problem List Items Addressed This Visit    None    Visit Diagnoses    Bell's palsy    -  Primary   Relevant Medications   predniSONE (STERAPRED UNI-PAK 21 TAB) 10 MG (21) TBPK tablet   valACYclovir (VALTREX) 1000 MG tablet       Meds ordered this encounter  Medications  . predniSONE (STERAPRED UNI-PAK 21 TAB) 10 MG (21) TBPK tablet    Sig: Take per package instructions. Do not skip doses. Finish entire supply.    Dispense:  1 each    Refill:  0    Order Specific Question:   Supervising Provider    AnswerRutherford Guys [[id number]]  . valACYclovir (VALTREX) 1000 MG tablet    Sig: Take 1 tablet (1,000 mg total) by mouth 3 (three) times daily.    Dispense:  21 tablet    Refill:  0    Order Specific Question:   Supervising Provider    Answer:   [place], Lilia Argue [[id number]]   PLAN  Appears as simple Bells Palsy - does not appear to be infectious or CV process.  Given hx of HSV infection will start valacyclovir 1g PO tid  Steroid taper  If no improvement or any change in symptoms call clinic or go to ER - reviewed in particular stroke symptoms with patient who demonstrated understanding  Patient encouraged to call clinic with any questions, comments, or concerns.  Maximiano Coss, NP

## 2020-03-10 NOTE — Patient Instructions (Signed)
° ° ° °  If you have lab work done today you will be contacted with your lab results within the next 2 weeks.  If you have not heard from us then please contact us. The fastest way to get your results is to register for My Chart. ° ° °IF you received an x-ray today, you will receive an invoice from [facility]. Please contact [facility] at [phone] with questions or concerns regarding your invoice.  ° °IF you received labwork today, you will receive an invoice from [facility]. Please contact [facility] at [phone] with questions or concerns regarding your invoice.  ° °Our billing staff will not be able to assist you with questions regarding bills from these companies. ° °You will be contacted with the lab results as soon as they are available. The fastest way to get your results is to activate your My Chart account. Instructions are located on the last page of this paperwork. If you have not heard from us regarding the results in 2 weeks, please contact this office. °  ° ° ° °

## 2020-03-11 ENCOUNTER — Telehealth: Payer: Self-pay | Admitting: Neurology

## 2020-03-11 ENCOUNTER — Encounter: Payer: Self-pay | Admitting: *Deleted

## 2020-03-11 DIAGNOSIS — M47812 Spondylosis without myelopathy or radiculopathy, cervical region: Secondary | ICD-10-CM | POA: Diagnosis not present

## 2020-03-11 DIAGNOSIS — M5136 Other intervertebral disc degeneration, lumbar region: Secondary | ICD-10-CM | POA: Diagnosis not present

## 2020-03-11 DIAGNOSIS — Z79899 Other long term (current) drug therapy: Secondary | ICD-10-CM | POA: Diagnosis not present

## 2020-03-11 DIAGNOSIS — Z79891 Long term (current) use of opiate analgesic: Secondary | ICD-10-CM | POA: Diagnosis not present

## 2020-03-11 DIAGNOSIS — G894 Chronic pain syndrome: Secondary | ICD-10-CM | POA: Diagnosis not present

## 2020-03-11 DIAGNOSIS — M25561 Pain in right knee: Secondary | ICD-10-CM | POA: Diagnosis not present

## 2020-03-11 NOTE — Telephone Encounter (Signed)
I called the pt. She is currently out of the house and will be home in about an hour and will retake her BP after resting 20 minutes. She was at a doctor's office today and said her BP was taken in both arms, 160/129 in one arm and 160/99 in the other. She checked her BP at home and it was 178/131. Patient aware that if she develops any symptoms including but not limited to vision problems, numbness/tingling, dizziness she needs to get to a hospital for BP treatment and neurological evaluation. Pt stated she took a dose of Prednisone last night and will continue it. 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 She said she had a bad reaction to the Toradol recently and then then developed Bell's Palsy. She took Nurtec for 5 days straight after her last botox. She is now out and doesn't feel it really has been helping anyway and wants to know what else she can take. Pt asked if she can take Emgality a little early, due on 8/8. Patient will get back to [place] with the repeat BP check. She is aware that Prednisone will also help her headache. She verbalized appreciation for the call.

## 2020-03-11 NOTE — Telephone Encounter (Signed)
Pt has called to report the Nurtec is not helping her headaches are worse.  Pt states her Blood pressure today is 160/129 her pain is at a 3 or 4.  Pt states yesterday her Blood pressure was 129/88 the headache started yesterday afternoon.  Pt asking for a call to discuss what options are available to her

## 2020-03-11 NOTE — Telephone Encounter (Signed)
Spoke with pt and discussed advice from Amy NP. Pt verbalized understanding. She noted that her latest BP had reduced to 135/89. She does still have the headache but will continue prednisone and be in touch as needed. She verbalized appreciation for the call.

## 2020-03-11 NOTE — Telephone Encounter (Signed)
Spoke with Amy NP. Ok to take [employer] early this time. For immediate future patient should focus on blood pressure control, hydration, taking the prednisone and treating the bell's palsy. Then if all of that is controlled/resolved and she still has headaches then we will regroup.

## 2020-03-24 ENCOUNTER — Other Ambulatory Visit: Payer: Self-pay | Admitting: Gastroenterology

## 2020-03-26 ENCOUNTER — Ambulatory Visit: Payer: Medicare Other | Admitting: Family Medicine

## 2020-04-08 DIAGNOSIS — M47816 Spondylosis without myelopathy or radiculopathy, lumbar region: Secondary | ICD-10-CM | POA: Diagnosis not present

## 2020-04-08 DIAGNOSIS — M25511 Pain in right shoulder: Secondary | ICD-10-CM | POA: Diagnosis not present

## 2020-04-14 ENCOUNTER — Other Ambulatory Visit: Payer: Self-pay | Admitting: Pain Medicine

## 2020-04-14 DIAGNOSIS — G8929 Other chronic pain: Secondary | ICD-10-CM

## 2020-04-15 ENCOUNTER — Telehealth: Payer: Self-pay | Admitting: Family Medicine

## 2020-04-15 DIAGNOSIS — M25561 Pain in right knee: Secondary | ICD-10-CM | POA: Diagnosis not present

## 2020-04-15 NOTE — Telephone Encounter (Signed)
Please let her know that, unfortunately, I do not have any availability in the office. I would recommend she increase gabapentin dose to 900mg  three times daily for 2-3 days to see if this helps. I am not certain if her PCP office does occipital nerve loacks? That could be an alternative option.

## 2020-04-15 NOTE — Telephone Encounter (Signed)
Noted. I placed pt on wait list for Dr Jaynee Eagles & Amy NP.

## 2020-04-15 NOTE — Telephone Encounter (Signed)
I called pt and relayed that due to no availability for pt at this time in AL/NP or AA/MD schedule can try increasing gabapentin 900mg  po TID for the next 2-3 days and see if this may help.  I will place in cancellation list and will cc [facility] RN as well if cancellation in Dr. Jaynee Eagles schedule as well.

## 2020-04-15 NOTE — Telephone Encounter (Signed)
I called pt.  She is having exacerbatin of TN/Occipital nerve pain started yesterday 1400 with pounding, hard to sleep last night, has increased to level 8 (her baseline 3-4) over the last 1-1.5 hours.  Chin to chest and L turn of next hurts a lot.  Taking 600mg  po TID gabapentin.  Not touching it.  Has had occipital nerve block in past for this.  Last seen 02/2020 for BOTOX, migraines doing ok.  ED Is not helpful for this is past.

## 2020-04-15 NOTE — Telephone Encounter (Signed)
Pt called, having bad pain, started 2 pm yesterday and gotten worse in the last hour. Nerves in face getting tighter. When pt  turn head to the left,pain gets worse.

## 2020-04-16 ENCOUNTER — Ambulatory Visit (INDEPENDENT_AMBULATORY_CARE_PROVIDER_SITE_OTHER): Payer: Medicare Other | Admitting: Family Medicine

## 2020-04-16 ENCOUNTER — Other Ambulatory Visit: Payer: Self-pay

## 2020-04-16 ENCOUNTER — Encounter: Payer: Self-pay | Admitting: Family Medicine

## 2020-04-16 VITALS — BP 136/88 | HR 118 | Ht 62.5 in | Wt 174.0 lb

## 2020-04-16 DIAGNOSIS — M5481 Occipital neuralgia: Secondary | ICD-10-CM

## 2020-04-16 NOTE — Progress Notes (Signed)
     History: Cassidy Olsen presents today with reports of stabbing pain of the right occipital region. She is also having significant muscle tension of right trapezius and cervical paraspinal muscles. She is not certain which started first. She has taken gabapentin with minimal relief. She has responded well to occipital nerve blocks in the past. I have discussed sending her for consultation for RFA with Dr Ernestina Patches. She is in agreement. She will monitor symptoms closely. Will reach out to pain management if neck/shoulder pain continue following nerve block.     Bupivicaine injection/Lidocaine protocol for occipital neuralgia  Bupivacaine 0.5% and Lidocaine 2% 1:1 mixture was injected on the right scalp at several locations:  -On the occipital area of the head, 3 injections, 1 cc per injection at the midpoint between the mastoid process and the occipital protuberance. 2 other injections were done one finger breadth from the initial injection, one at a 10 o'clock position and the other at a 2 o'clock position.  The patient tolerated the injections well, no complications of the procedure were noted. Injections were made with a 27-gauge needle.

## 2020-04-16 NOTE — Progress Notes (Signed)
Nerve Block:   Lidocaine 2% 1.10mL Lot: [id number] Exp: 02/05/2022 NDC: 1740-8144-81  Bupivacaine 0.5% 1.45mL Lot: [id number] Exp: 09/08/2019 NDC: 70263-785-88

## 2020-04-17 ENCOUNTER — Other Ambulatory Visit: Payer: Self-pay | Admitting: Gastroenterology

## 2020-04-17 ENCOUNTER — Emergency Department (HOSPITAL_BASED_OUTPATIENT_CLINIC_OR_DEPARTMENT_OTHER): Payer: Medicare Other

## 2020-04-17 ENCOUNTER — Encounter (HOSPITAL_BASED_OUTPATIENT_CLINIC_OR_DEPARTMENT_OTHER): Payer: Self-pay | Admitting: Emergency Medicine

## 2020-04-17 ENCOUNTER — Emergency Department (HOSPITAL_BASED_OUTPATIENT_CLINIC_OR_DEPARTMENT_OTHER)
Admission: EM | Admit: 2020-04-17 | Discharge: 2020-04-17 | Disposition: A | Discharge status: Home Health | Payer: Medicare Other | Attending: Emergency Medicine | Admitting: Emergency Medicine

## 2020-04-17 ENCOUNTER — Other Ambulatory Visit: Payer: Self-pay

## 2020-04-17 DIAGNOSIS — R519 Headache, unspecified: Secondary | ICD-10-CM | POA: Diagnosis not present

## 2020-04-17 DIAGNOSIS — Z91040 Latex allergy status: Secondary | ICD-10-CM | POA: Insufficient documentation

## 2020-04-17 DIAGNOSIS — Z96651 Presence of right artificial knee joint: Secondary | ICD-10-CM | POA: Insufficient documentation

## 2020-04-17 DIAGNOSIS — J45909 Unspecified asthma, uncomplicated: Secondary | ICD-10-CM | POA: Insufficient documentation

## 2020-04-17 DIAGNOSIS — M549 Dorsalgia, unspecified: Secondary | ICD-10-CM | POA: Diagnosis not present

## 2020-04-17 DIAGNOSIS — Z79899 Other long term (current) drug therapy: Secondary | ICD-10-CM | POA: Diagnosis not present

## 2020-04-17 DIAGNOSIS — I10 Essential (primary) hypertension: Secondary | ICD-10-CM | POA: Insufficient documentation

## 2020-04-17 DIAGNOSIS — M542 Cervicalgia: Secondary | ICD-10-CM | POA: Diagnosis not present

## 2020-04-17 DIAGNOSIS — M47812 Spondylosis without myelopathy or radiculopathy, cervical region: Secondary | ICD-10-CM | POA: Diagnosis not present

## 2020-04-17 DIAGNOSIS — Z20822 Contact with and (suspected) exposure to covid-19: Secondary | ICD-10-CM | POA: Insufficient documentation

## 2020-04-17 DIAGNOSIS — M546 Pain in thoracic spine: Secondary | ICD-10-CM | POA: Diagnosis not present

## 2020-04-17 LAB — SARS CORONAVIRUS 2 BY RT PCR (HOSPITAL ORDER, PERFORMED IN ~~LOC~~ HOSPITAL LAB): SARS Coronavirus 2: NEGATIVE

## 2020-04-17 MED ORDER — HYDROCODONE-ACETAMINOPHEN 5-325 MG PO TABS
2.0000 | ORAL_TABLET | Freq: Once | ORAL | Status: AC
  Administered 2020-04-17: 2 via ORAL
  Filled 2020-04-17: qty 2

## 2020-04-17 MED ORDER — PREDNISONE 50 MG PO TABS
60.0000 mg | ORAL_TABLET | Freq: Once | ORAL | Status: AC
  Administered 2020-04-17: 60 mg via ORAL
  Filled 2020-04-17: qty 1

## 2020-04-17 MED ORDER — PREDNISONE 10 MG (21) PO TBPK: ORAL_TABLET | Freq: Every day | ORAL | 0 refills | Status: DC

## 2020-04-17 NOTE — ED Notes (Addendum)
Was given a nerve   Block yesterday at the neurologist office , when it wore off last night she   She is still in pain  States head neck and shoulders have been hurting x 3 days  She states she drove herself to hospital

## 2020-04-17 NOTE — ED Triage Notes (Signed)
Upper back pain , shoullder pain. Had nerve block yesterday. No relief .

## 2020-04-17 NOTE — ED Provider Notes (Signed)
[facility] EMERGENCY DEPARTMENT Provider Note   CSN: [id number] Arrival date & time: 04/17/20  1123     History Chief Complaint  Patient presents with  . Back Pain    upper    Cassidy Olsen is a 59 y.o. female with past medical history significant for arthritis, chronic pain, hypertension, migraines, osteoarthritis, trigeminal neuralgia, occipital neuralgia.  HPI Patient presents to emergency department today with chief complaint of neck and upper back pain x3 days.    She is describing the pain as sharp and shooting. It radiates down both sides of her neck. Pain is constant. She rates pain 10/10 in severity. She has limited range of motion in her neck because of the pain. She states this pain did not feel like her typical occipital neuralgia pain because she does not usually have neck pain with it. She called her neurologist and had a nerve block performed yesterday. It gave her significant pain relief. However once it wore off her pain returned. She states usually after getting the nerve block her pain does not return. She is also endorsing a headache. The headache has progressively worsened since onset. The pain is located in the base of her neck and radiates up the back of her head. It feels like headaches she has had in the past. She states she was recently in [place] to help clean her sister's house. She was more physically active carrying boxes and going up and down the stairs frequently. Denies fever, syncope, head trauma, photophobia, phonophobia, UL throbbing, N/V, visual changes,rash, or "thunderclap" onset.       Past Medical History:  Diagnosis Date  . Arthritis 2010  . Asthma   . Chronic headaches   . Chronic pain   . Family history of breast cancer   . Family history of prostate cancer   . Gallstones 2018  . GERD (gastroesophageal reflux disease)   . High cholesterol   . Hx: UTI (urinary tract infection)   . Hypertension   . IBS (irritable bowel  syndrome)   . Interstitial cystitis 2012  . Kidney stones    2020-2021  . Migraine   . Occipital neuralgia   . Osteoarthritis   . PNA (pneumonia) 2018  . Spinal stenosis   . Trigeminal neuralgia   . Trigeminal neuralgia     Patient Active Problem List   Diagnosis Date Noted  . RUQ abdominal pain 12/25/2019  . Decreased range of knee movement 09/02/2019  . 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 Constipation 09/02/2019  . Calculus, ureter 07/24/2019  . Chronic migraine without aura, with intractable migraine, so stated, with status migrainosus 05/30/2019  . Chronic asthmatic bronchitis ([place]) 05/14/2019  . Excessive daytime sleepiness 01/08/2019  . Upper airway cough syndrome 11/12/2018  . Acute bacterial sinusitis 07/17/2018  . Abnormal chest CT 07/17/2018  . Trigeminal neuralgia of right side of face 02/01/2018  . Occipital neuralgia 02/01/2018  . Chronic migraine without aura without status migrainosus, not intractable 02/01/2018  . Decreased strength 05/10/2017  . Family history of breast cancer   . Family history of prostate cancer   . Chronic left shoulder pain 09/02/2015  . Carpal tunnel syndrome 08/25/2011  . Asthma 04/14/2009  . Essential hypertension 04/14/2009  . Hypercholesterolemia 04/14/2009    Past Surgical History:  Procedure Laterality Date  . ABDOMINAL HYSTERECTOMY    . ANKLE SURGERY Left 05/2009  . APPENDECTOMY    . BACK SURGERY    . Benign Tumor Removal Right  01/26/2017   right upper arm by Dr[facility] Pershing [facility]  . BRAIN SURGERY    . CARPAL TUNNEL RELEASE  2014  . CYSTOSCOPY W/ URETERAL STENT PLACEMENT Left 07/25/2019   Procedure: CYSTOSCOPY WITH RETROGRADE PYELOGRAM/URETERAL STENT PLACEMENT;  Surgeon: Franchot Gallo, MD;  Location: [facility];  Service: Urology;  Laterality: Left;  . CYSTOSCOPY W/ URETERAL STENT REMOVAL Left 08/15/2019   Procedure: CYSTOSCOPY WITH STENT REMOVAL;  Surgeon: Franchot Gallo, MD;   Location: [facility];  Service: Urology;  Laterality: Left;  . CYSTOSCOPY WITH RETROGRADE PYELOGRAM, URETEROSCOPY AND STENT PLACEMENT Left 08/15/2019   Procedure: CYSTOSCOPY WITH RETROGRADE PYELOGRAM, URETEROSCOPY;  Surgeon: Franchot Gallo, MD;  Location: [facility];  Service: Urology;  Laterality: Left;  90 MINS  . CYSTOSCOPY WITH RETROGRADE PYELOGRAM, URETEROSCOPY AND STENT PLACEMENT Right 09/12/2019   Procedure: CYSTOSCOPY WITH RIGHT RETROGRADE PYELOGRAM  URETEROSCOPY WITH HOLMIUM LASER STONE EXTRACTION AND STENT PLACEMENT;  Surgeon: Franchot Gallo, MD;  Location: [facility];  Service: Urology;  Laterality: Right;  . HOLMIUM LASER APPLICATION Left 04/11/5037   Procedure: HOLMIUM LASER APPLICATION;  Surgeon: Franchot Gallo, MD;  Location: [facility];  Service: Urology;  Laterality: Left;  . JOINT REPLACEMENT     R knee  . KIDNEY STONE SURGERY    . OVARIAN CYST REMOVAL  05/2010  . right knee revision  2016  . ROTATOR CUFF REPAIR Right 02/2019  . SPINE SURGERY    . TUBAL LIGATION       OB History    Gravida  3   Para  1   Term      Preterm      AB  2   Living  1     SAB  1   TAB  1   Ectopic      Multiple      Live Births              Family History  Problem Relation Age of Onset  . Ulcers Mother 23       peptic ulcer  . Irritable bowel syndrome Mother   . Prostate cancer Father 3  . Heart disease Father   . Irritable bowel syndrome Father   . Breast cancer Sister 65       ER+/PR+/Her2- breast cancer  . Breast cancer Paternal Grandmother   . Diabetes Paternal Grandmother   . Cancer Paternal Uncle        unknown cancers    Social History   Tobacco Use  . Smoking status: Never Smoker  . Smokeless tobacco: Never Used  Vaping Use  . Vaping Use: Never used  Substance Use Topics  . Alcohol use: Yes    Comment: socially  . Drug use: No    Home Medications Prior to Admission medications   Medication Sig Start Date End Date Taking? Authorizing Provider    acetaminophen (TYLENOL) 325 MG tablet Take 650 mg by mouth daily.    [provider]  albuterol (VENTOLIN HFA) 108 (90 Base) MCG/ACT inhaler Inhale 2 puffs into the lungs every 6 (six) hours as needed for wheezing or shortness of breath. 05/13/19   Icard, Octavio Graves, DO  amitriptyline (ELAVIL) 50 MG tablet Take 1 tablet (50 mg total) by mouth at bedtime. 01/28/20   Lomax, Amy, NP  budesonide-formoterol (SYMBICORT) 80-4.5 MCG/ACT inhaler Inhale 2 puffs into the lungs every 12 (twelve) hours. Patient taking differently: Inhale 2 puffs into the lungs 2 (two) times daily as needed (respiratory issues.).  05/13/19   Garner Nash, DO  Calcium Carb-Cholecalciferol (CALCIUM + D3 PO) Take 1 tablet by mouth daily.    [provider]  celecoxib (CELEBREX) 200 MG capsule Take 200 mg by mouth daily.     [provider]  cholecalciferol (VITAMIN D) 25 MCG (1000 UT) tablet Take 1,000 Units by mouth daily.    [provider]  dexlansoprazole (DEXILANT) 60 MG capsule Take 1 capsule (60 mg total) by mouth daily. 02/27/20   Mauri Pole, MD  dicyclomine (BENTYL) 10 MG capsule TAKE 2 CAPSULES BY MOUTH 3 TIMES DAILY BEFORE MEALS. 04/17/20   Mauri Pole, MD  fenofibrate (TRICOR) 145 MG tablet Take 1 tablet (145 mg total) by mouth daily. 12/09/19   Forrest Moron, MD  fluticasone (FLONASE) 50 MCG/ACT nasal spray Place 2 sprays into both nostrils daily. Patient taking differently: Place 2 sprays into both nostrils 2 (two) times daily.  05/13/19   Icard, Octavio Graves, DO  gabapentin (NEURONTIN) 300 MG capsule Take 1-2 capsules (300-600 mg total) by mouth 3 (three) times daily. Patient taking differently: Take 600 mg by mouth 3 (three) times daily.  05/01/19   Lomax, Amy, NP  Galcanezumab-gnlm (EMGALITY) 120 MG/ML SOAJ Inject 120 mg into the skin every 30 (thirty) days. 02/18/20   Melvenia Beam, MD  loratadine (CLARITIN) 10 MG tablet Take 10 mg by mouth at bedtime.  Patient not  taking: Reported on 03/10/2020    [provider]  lubiprostone (AMITIZA) 24 MCG capsule Take 1 capsule (24 mcg total) by mouth 2 (two) times daily with a meal. 12/27/19   Nandigam, Venia Minks, MD  methocarbamol (ROBAXIN) 500 MG tablet Take 500 mg by mouth at bedtime. Patient is taking half 02/11/19   [provider]  montelukast (SINGULAIR) 10 MG tablet Take 1 tablet (10 mg total) by mouth at bedtime. 05/13/19   Icard, Octavio Graves, DO  predniSONE (STERAPRED UNI-PAK 21 TAB) 10 MG (21) TBPK tablet Take by mouth daily. Take 6 tabs by mouth daily  for 2 days, then 5 tabs for 2 days, then 4 tabs for 2 days, then 3 tabs for 2 days, 2 tabs for 2 days, then 1 tab by mouth daily for 2 days 04/17/20   Giordana Weinheimer, Verline Lema E, PA-C  Probiotic Product (UP4 PROBIOTICS ULTRA) CAPS Take 1 capsule by mouth daily.    [provider]  Rimegepant Sulfate (NURTEC) 75 MG TBDP Take 75 mg by mouth daily as needed (take for abortive therapy of migraine, no more than 1 tablet in 24 hours or 10 per month). 01/28/20   Debbora Presto, NP  Spacer/Aero-Holding Chambers (AEROCHAMBER MV) inhaler Use as instructed 04/18/18   Icard, Octavio Graves, DO  tapentadol (NUCYNTA ER) 100 MG 12 hr tablet Take 100 mg by mouth every 12 (twelve) hours.    [provider]  valACYclovir (VALTREX) 1000 MG tablet Take 1 tablet (1,000 mg total) by mouth 3 (three) times daily. 03/10/20   Maximiano Coss, NP  valACYclovir (VALTREX) 500 MG tablet Take 500 mg by mouth 2 (two) times daily as needed (outbreaks).     [provider]    Allergies    Aspirin, Ketorolac tromethamine, Penicillins, Codeine, Ibuprofen, Latex, Omeprazole magnesium, Pantoprazole sodium, Penicillamine, and Pregabalin  Review of Systems   Review of Systems  All other systems are reviewed and are negative for acute change except as noted in the HPI.   Physical Exam Updated Vital Signs BP 123/88 (BP Location: Right  Arm)   Pulse 93   Temp 98.9 F (37.2 C)  (Oral)   Resp 16   Ht 5' 2.5" (1.588 m)   Wt 77.6 kg   SpO2 99%   BMI 30.78 kg/m   Physical Exam Vitals and nursing note reviewed.  Constitutional:      General: She is not in acute distress.    Appearance: She is not ill-appearing.     Comments: Uncomfortable appearing  HENT:     Head: Normocephalic and atraumatic.     Comments: No sinus or temporal tenderness.    Right Ear: Tympanic membrane and external ear normal.     Left Ear: Tympanic membrane and external ear normal.     Nose: Nose normal.     Mouth/Throat:     Mouth: Mucous membranes are moist.     Pharynx: Oropharynx is clear.  Eyes:     General: No scleral icterus.       Right eye: No discharge.        Left eye: No discharge.     Extraocular Movements: Extraocular movements intact.     Conjunctiva/sclera: Conjunctivae normal.     Pupils: Pupils are equal, round, and reactive to light.  Neck:     Vascular: No JVD.     Comments: Tenderness to palpation of bilateral paraspinal muscles of cervical spine. Midline tenderness. Decreased flexion, extension, and rotation of neck secondary to pain. No steps or bony deformities. No erythema. No meningismus.  Cardiovascular:     Rate and Rhythm: Normal rate and regular rhythm.     Pulses: Normal pulses.          Radial pulses are 2+ on the right side and 2+ on the left side.     Heart sounds: Normal heart sounds.  Pulmonary:     Comments: Lungs clear to auscultation in all fields. Symmetric chest rise. No wheezing, rales, or rhonchi. Abdominal:     Comments: Abdomen is soft, non-distended, and non-tender in all quadrants. No rigidity, no guarding. No peritoneal signs.  Musculoskeletal:        General: Normal range of motion.  Skin:    General: Skin is warm and dry.     Capillary Refill: Capillary refill takes less than 2 seconds.  Neurological:     Mental Status: She is oriented to person, place, and time.     GCS: GCS eye subscore is 4. GCS verbal subscore is 5. GCS  motor subscore is 6.     Comments: Speech is clear and goal oriented, follows commands CN III-XII intact, no facial droop Normal strength in upper and lower extremities bilaterally including dorsiflexion and plantar flexion, strong and equal grip strength Sensation normal to light and sharp touch Moves extremities without ataxia, coordination intact Normal finger to nose and rapid alternating movements Normal gait and balance   Psychiatric:        Behavior: Behavior normal.     ED Results / Procedures / Treatments   Labs (all labs ordered are listed, but only abnormal results are displayed) Labs Reviewed  SARS CORONAVIRUS 2 BY RT PCR (HOSPITAL ORDER, [facility])    EKG None  Radiology CT Head Wo Contrast  Result Date: 04/17/2020 CLINICAL DATA:  Headache. EXAM: CT HEAD WITHOUT CONTRAST TECHNIQUE: Contiguous axial images were obtained from the base of the skull through the vertex without intravenous contrast. COMPARISON:  None. FINDINGS: Brain: No evidence of acute infarction, hemorrhage, hydrocephalus, extra-axial collection or mass  lesion/mass effect. Vascular: No hyperdense vessel or unexpected calcification. Skull: Normal. Negative for fracture or focal lesion. Postsurgical changes related to right occipital craniectomy are again noted. Sinuses/Orbits: No acute finding. Other: None. IMPRESSION: No acute intracranial abnormality. Electronically Signed   By: Constance Holster M.D.   On: 04/17/2020 17:03   CT Cervical Spine Wo Contrast  Result Date: 04/17/2020 CLINICAL DATA:  Neck and shoulder pain for 3 days, cervical nerve block yesterday with persistent pain EXAM: CT CERVICAL SPINE WITHOUT CONTRAST TECHNIQUE: Multidetector CT imaging of the cervical spine was performed without intravenous contrast. Multiplanar CT image reconstructions were also generated. COMPARISON:  10/30/2019 FINDINGS: Alignment: Alignment is anatomic. Skull base and vertebrae: No acute  displaced fractures. Postsurgical changes are seen from prior right occipital craniotomy/mastoidectomy. Soft tissues and spinal canal: No prevertebral fluid or swelling. No visible canal hematoma. Disc levels: Stable C6/C7 ACDF. Prominent anterior osteophyte at C5/C6 unchanged. Mild facet hypertrophy most pronounced at C2-3. No significant compressive sequela. Upper chest: Airway is patent.  Lung apices are clear. Other: Reconstructed images demonstrate no additional findings. IMPRESSION: 1. No acute cervical spine fracture. 2. Stable spondylosis and facet hypertrophy. Electronically Signed   By: Randa Ngo M.D.   On: 04/17/2020 16:58    Procedures Procedures (including critical care time)  Medications Ordered in ED Medications  predniSONE (DELTASONE) tablet 60 mg (60 mg Oral Given 04/17/20 1705)  HYDROcodone-acetaminophen (NORCO/VICODIN) 5-325 MG per tablet 2 tablet (2 tablets Oral Given 04/17/20 1705)    ED Course  I have reviewed the triage vital signs and the nursing notes.  Pertinent labs & imaging results that were available during my care of the patient were reviewed by me and considered in my medical decision making (see chart for details).    MDM Rules/Calculators/A&P                          History provided by patient with additional history obtained from chart review.     Patient presenting today with neck pain and headache.  Has a history of occipital neuralgia and states this feels different.  On exam she uncomfortable appearing.  She has decreased range of motion of her neck secondary to pain.  However has no meningismus.  Neuro exam is normal.  Not the worst headache of her life. With her history and significant pain will go ahead and scan head and cervical spine.  Imaging was negative.  No acute abnormalities.  Patient given dose of prednisone and Norco.  Her symptoms have mildly improved on reassessment.  Discussed this could possibly be MSK pain given she had increased  physical activity lately.  Will discharge home with prednisone taper.  She plans to follow-up with PCP and pain clinic next week. She is also requesting a covid test. She knows to quarantine until she has the test result.   The patient appears reasonably screened and/or stabilized for discharge and I doubt any other medical condition or other [facility] requiring further screening, evaluation, or treatment in the ED at this time prior to discharge. The patient is safe for discharge with strict return precautions discussed. Findings and plan of care discussed with supervising physician Dr Maryan Rued.   Cassidy Olsen was evaluated in Emergency Department on 04/17/2020 for the symptoms described in the history of present illness. She was evaluated in the context of the global COVID-19 pandemic, which necessitated consideration that the patient might be at risk for infection with the SARS-CoV-2 virus  that causes COVID-19. Institutional protocols and algorithms that pertain to the evaluation of patients at risk for COVID-19 are in a state of rapid change based on information released by regulatory bodies including the CDC and federal and state organizations. These policies and algorithms were followed during the patient's care in the ED.   Portions of this note were generated with [employer]. Dictation errors may occur despite best attempts at proofreading.    Final Clinical Impression(s) / ED Diagnoses Final diagnoses:  Neck pain    Rx / DC Orders ED Discharge Orders         Ordered    predniSONE (STERAPRED UNI-PAK 21 TAB) 10 MG (21) TBPK tablet  Daily        04/17/20 1755           Cherre Robins, PA-C 04/17/20 Newt Minion, MD 04/17/20 1950

## 2020-04-17 NOTE — Discharge Instructions (Addendum)
Prescription sent to your pharmacy for prednisone taper.  Start taking it tomorrow.  Take as prescribed.  The CT scans of your head and neck today did not show any abnormalities.  You were tested for Covid today.  If your test is positive you will receive a phone call.  If it is negative it will be available for you to see in your MyChart.   Thank you for allowing [place] to care for you today.  I hope you feel better soon.  Follow-up with your primary care doctor and pain management doctor as we discussed.

## 2020-04-22 ENCOUNTER — Encounter (HOSPITAL_COMMUNITY)
Admission: RE | Admit: 2020-04-22 | Discharge: 2020-04-22 | Disposition: A | Discharge status: Home / Self Care | Payer: Medicare Other | Source: Ambulatory Visit | Attending: Orthopedic Surgery | Admitting: Orthopedic Surgery

## 2020-04-22 ENCOUNTER — Other Ambulatory Visit: Payer: Self-pay

## 2020-04-22 DIAGNOSIS — M25561 Pain in right knee: Secondary | ICD-10-CM | POA: Diagnosis not present

## 2020-04-22 DIAGNOSIS — T8484XA Pain due to internal orthopedic prosthetic devices, implants and grafts, initial encounter: Secondary | ICD-10-CM | POA: Diagnosis not present

## 2020-04-22 DIAGNOSIS — Z96651 Presence of right artificial knee joint: Secondary | ICD-10-CM | POA: Insufficient documentation

## 2020-04-22 MED ORDER — TECHNETIUM TC 99M MEDRONATE IV KIT
20.0000 | PACK | Freq: Once | INTRAVENOUS | Status: AC | PRN
  Administered 2020-04-22: 20 via INTRAVENOUS

## 2020-04-29 DIAGNOSIS — F4325 Adjustment disorder with mixed disturbance of emotions and conduct: Secondary | ICD-10-CM | POA: Diagnosis not present

## 2020-05-04 ENCOUNTER — Other Ambulatory Visit: Payer: Self-pay

## 2020-05-04 ENCOUNTER — Ambulatory Visit
Admission: RE | Admit: 2020-05-04 | Discharge: 2020-05-04 | Disposition: A | Discharge status: Home / Self Care | Payer: Medicare Other | Source: Ambulatory Visit | Attending: Pain Medicine | Admitting: Pain Medicine

## 2020-05-04 DIAGNOSIS — M25562 Pain in left knee: Secondary | ICD-10-CM | POA: Diagnosis not present

## 2020-05-04 DIAGNOSIS — G8929 Other chronic pain: Secondary | ICD-10-CM

## 2020-05-06 ENCOUNTER — Telehealth: Payer: Self-pay | Admitting: Gastroenterology

## 2020-05-06 ENCOUNTER — Ambulatory Visit: Payer: Medicare Other | Admitting: Gastroenterology

## 2020-05-06 NOTE — Progress Notes (Deleted)
Cassidy Olsen    [id number]    [birth year]  Primary Care Physician:Stallings, Arlie Solomons, MD  Referring Physician: Forrest Moron, MD [phone] [place],  [place] [address]   Chief complaint:  ***  HPI:  Abdominal ultrasound 01/02/20: 1. Gallstones without sonographic evidence for acute cholecystitis or biliary dilatation 2. Bilateral kidney stones without hydronephrosis 3. Complicated cyst upper pole left kidney measuring 1.3 cm, could be more thoroughly characterized by MRI.  Colonoscopy February 2018, small adenomatous colon polyp removed and medium-sized hemorrhoids  EGD February 2018 showed hiatal hernia and gastritis.  Biopsies negative for H. pylori.  Duodenal biopsies negative for celiac.  Patient had work-up in [place], HIDA scan and small bowel series insert negative for any significant pathology  Sister has celiac disease and her daughter has gluten sensitivity  TTG IgA Ab negative for celiac  Kidney stone removal 09/2019  CT abdomen and pelvis with contrast December 2020 1. Obstructing 8 x 6 mm calculus at the left ureteropelvic junction resulting in mild-to-moderate left hydronephrosis with small volume perinephric fluid and stranding. Extensive left-sided urothelial enhancement and stranding raises suspicion for a superimposed infectious or inflammatory process. 2. Thickened appearance of the urinary bladder, particularly along its superior margin. Recommend correlation with urinalysis. 3. Multiple nonobstructing right renal calculi, largest measuring up to 9 mm. 4. Cholelithiasis without evidence for cholecystitis. 5. Previously seen left adnexal cyst has slightly decreased in size.   Outpatient Encounter Medications as of 05/06/2020  Medication Sig  . acetaminophen (TYLENOL) 325 MG tablet Take 650 mg by mouth daily.  Marland Kitchen albuterol (VENTOLIN HFA) 108 (90 Base) MCG/ACT inhaler Inhale 2 puffs into the lungs every 6  (six) hours as needed for wheezing or shortness of breath.  Marland Kitchen amitriptyline (ELAVIL) 50 MG tablet Take 1 tablet (50 mg total) by mouth at bedtime.  . budesonide-formoterol (SYMBICORT) 80-4.5 MCG/ACT inhaler Inhale 2 puffs into the lungs every 12 (twelve) hours. (Patient taking differently: Inhale 2 puffs into the lungs 2 (two) times daily as needed (respiratory issues.). )  . Calcium Carb-Cholecalciferol (CALCIUM + D3 PO) Take 1 tablet by mouth daily.  . celecoxib (CELEBREX) 200 MG capsule Take 200 mg by mouth daily.   . cholecalciferol (VITAMIN D) 25 MCG (1000 UT) tablet Take 1,000 Units by mouth daily.  Marland Kitchen dexlansoprazole (DEXILANT) 60 MG capsule Take 1 capsule (60 mg total) by mouth daily.  Marland Kitchen dicyclomine (BENTYL) 10 MG capsule TAKE 2 CAPSULES BY MOUTH 3 TIMES DAILY BEFORE MEALS.  . fenofibrate (TRICOR) 145 MG tablet Take 1 tablet (145 mg total) by mouth daily.  . fluticasone (FLONASE) 50 MCG/ACT nasal spray Place 2 sprays into both nostrils daily. (Patient taking differently: Place 2 sprays into both nostrils 2 (two) times daily. )  . gabapentin (NEURONTIN) 300 MG capsule Take 1-2 capsules (300-600 mg total) by mouth 3 (three) times daily. (Patient taking differently: Take 600 mg by mouth 3 (three) times daily. )  . Galcanezumab-gnlm (EMGALITY) 120 MG/ML SOAJ Inject 120 mg into the skin every 30 (thirty) days.  Marland Kitchen loratadine (CLARITIN) 10 MG tablet Take 10 mg by mouth at bedtime.  (Patient not taking: Reported on 03/10/2020)  . lubiprostone (AMITIZA) 24 MCG capsule Take 1 capsule (24 mcg total) by mouth 2 (two) times daily with a meal.  . methocarbamol (ROBAXIN) 500 MG tablet Take 500 mg by mouth at bedtime. Patient is taking half  . montelukast (SINGULAIR)  10 MG tablet Take 1 tablet (10 mg total) by mouth at bedtime.  . predniSONE (STERAPRED UNI-PAK 21 TAB) 10 MG (21) TBPK tablet Take by mouth daily. Take 6 tabs by mouth daily  for 2 days, then 5 tabs for 2 days, then 4 tabs for 2 days, then 3 tabs  for 2 days, 2 tabs for 2 days, then 1 tab by mouth daily for 2 days  . Probiotic Product (UP4 PROBIOTICS ULTRA) CAPS Take 1 capsule by mouth daily.  . Rimegepant Sulfate (NURTEC) 75 MG TBDP Take 75 mg by mouth daily as needed (take for abortive therapy of migraine, no more than 1 tablet in 24 hours or 10 per month).  . Spacer/Aero-Holding Chambers (AEROCHAMBER MV) inhaler Use as instructed  . tapentadol (NUCYNTA ER) 100 MG 12 hr tablet Take 100 mg by mouth every 12 (twelve) hours.  . valACYclovir (VALTREX) 1000 MG tablet Take 1 tablet (1,000 mg total) by mouth 3 (three) times daily.  . valACYclovir (VALTREX) 500 MG tablet Take 500 mg by mouth 2 (two) times daily as needed (outbreaks).    No facility-administered encounter medications on file as of 05/06/2020.    Allergies as of 05/06/2020 - Review Complete 04/17/2020  Allergen Reaction Noted  . Aspirin Other (See Comments), Nausea And Vomiting, and Nausea Only 04/18/2013  . Ketorolac tromethamine Shortness Of Breath, Itching, and Other (See Comments) 08/14/2019  . Penicillins Hives, Itching, Rash, and Other (See Comments) 03/19/1994  . Codeine Other (See Comments) 07/17/2018  . Ibuprofen Other (See Comments) and Nausea And Vomiting 09/03/2016  . Latex Rash and Other (See Comments) 03/07/2011  . Omeprazole magnesium Other (See Comments) 12/25/2019  . Pantoprazole sodium Other (See Comments) 12/25/2019  . Penicillamine Rash 12/26/2019  . Pregabalin Other (See Comments) 12/25/2019    Past Medical History:  Diagnosis Date  . Arthritis 2010  . Asthma   . Chronic headaches   . Chronic pain   . Family history of breast cancer   . Family history of prostate cancer   . Gallstones 2018  . GERD (gastroesophageal reflux disease)   . High cholesterol   . Hx: UTI (urinary tract infection)   . Hypertension   . IBS (irritable bowel syndrome)   . Interstitial cystitis 2012  . Kidney stones    2020-2021  . Migraine   . Occipital neuralgia     . Osteoarthritis   . PNA (pneumonia) 2018  . Spinal stenosis   . Trigeminal neuralgia   . Trigeminal neuralgia     Past Surgical History:  Procedure Laterality Date  . ABDOMINAL HYSTERECTOMY    . ANKLE SURGERY Left 05/2009  . APPENDECTOMY    . BACK SURGERY    . 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 Pershing Proud [facility]  . BRAIN SURGERY    . CARPAL TUNNEL RELEASE  2014  . CYSTOSCOPY W/ URETERAL STENT PLACEMENT Left 07/25/2019   Procedure: CYSTOSCOPY WITH RETROGRADE PYELOGRAM/URETERAL STENT PLACEMENT;  Surgeon: Franchot Gallo, MD;  Location: [facility];  Service: Urology;  Laterality: Left;  . CYSTOSCOPY W/ URETERAL STENT REMOVAL Left 08/15/2019   Procedure: CYSTOSCOPY WITH STENT REMOVAL;  Surgeon: Franchot Gallo, MD;  Location: [facility];  Service: Urology;  Laterality: Left;  . CYSTOSCOPY WITH RETROGRADE PYELOGRAM, URETEROSCOPY AND STENT PLACEMENT Left 08/15/2019   Procedure: CYSTOSCOPY WITH RETROGRADE PYELOGRAM, URETEROSCOPY;  Surgeon: Franchot Gallo, MD;  Location: [facility];  Service: Urology;  Laterality: Left;  90 MINS  .  CYSTOSCOPY WITH RETROGRADE PYELOGRAM, URETEROSCOPY AND STENT PLACEMENT Right 09/12/2019   Procedure: CYSTOSCOPY WITH RIGHT RETROGRADE PYELOGRAM  URETEROSCOPY WITH HOLMIUM LASER STONE EXTRACTION AND STENT PLACEMENT;  Surgeon: Dahlstedt, Stephen, MD;  Location: [facility];  Service: Urology;  Laterality: Right;  . HOLMIUM LASER APPLICATION Left 08/15/2019   Procedure: HOLMIUM LASER APPLICATION;  Surgeon: Dahlstedt, Stephen, MD;  Location: [facility];  Service: Urology;  Laterality: Left;  . JOINT REPLACEMENT     R knee  . KIDNEY STONE SURGERY    . OVARIAN CYST REMOVAL  05/2010  . right knee revision  2016  . ROTATOR CUFF REPAIR Right 02/2019  . SPINE SURGERY    . TUBAL LIGATION      Family History  Problem Relation Age of Onset  . Ulcers Mother 51       peptic ulcer  . Irritable bowel syndrome Mother   . Prostate cancer  Father 79  . Heart disease Father   . Irritable bowel syndrome Father   . Breast cancer Sister 61       ER+/PR+/Her2- breast cancer  . Breast cancer Paternal Grandmother   . Diabetes Paternal Grandmother   . Cancer Paternal Uncle        unknown cancers    Social History   Socioeconomic History  . Marital status: Single    Spouse name: Not on file  . Number of children: 1  . Years of education: Not on file  . Highest education level: Not on file  Occupational History  . Occupation: retired  Tobacco Use  . Smoking status: Never Smoker  . Smokeless tobacco: Never Used  Vaping Use  . Vaping Use: Never used  Substance and Sexual Activity  . Alcohol use: Yes    Comment: socially  . Drug use: No  . Sexual activity: Not on file  Other Topics Concern  . Not on file  Social History Narrative  . Not on file   Social Determinants of Health   Financial Resource Strain:   . Difficulty of Paying Living Expenses: Not on file  Food Insecurity:   . Worried About Running Out of Food in the Last Year: Not on file  . Ran Out of Food in the Last Year: Not on file  Transportation Needs:   . Lack of Transportation (Medical): Not on file  . Lack of Transportation (Non-Medical): Not on file  Physical Activity:   . Days of Exercise per Week: Not on file  . Minutes of Exercise per Session: Not on file  Stress:   . Feeling of Stress : Not on file  Social Connections:   . Frequency of Communication with Friends and Family: Not on file  . Frequency of Social Gatherings with Friends and Family: Not on file  . Attends Religious Services: Not on file  . Active Member of Clubs or Organizations: Not on file  . Attends Club or Organization Meetings: Not on file  . Marital Status: Not on file  Intimate Partner Violence:   . Fear of Current or Ex-Partner: Not on file  . Emotionally Abused: Not on file  . Physically Abused: Not on file  . Sexually Abused: Not on file      Review of  systems: All other review of systems negative except as mentioned in the HPI.   Physical Exam: There were no vitals filed for this visit. There is no height or weight on file to calculate BMI. Gen:      No acute distress   HEENT:  sclera anicteric Abd:      soft, non-tender; no palpable masses, no distension Ext:    No edema Neuro: alert and oriented x 3 Psych: normal mood and affect  Data Reviewed:  Reviewed labs, radiology imaging, old records and pertinent past GI work up   Assessment and Plan/Recommendations:  ***  This visit required *** minutes of patient care (this includes precharting, chart review, review of results, face-to-face time used for counseling as well as treatment plan and follow-up. The patient was provided an opportunity to ask questions and all were answered. The patient agreed with the plan and demonstrated an understanding of the instructions.  Damaris Hippo , MD    CC: Forrest Moron, MD

## 2020-05-06 NOTE — Telephone Encounter (Signed)
Patient has refills. She will contact the pharmacy if she needs any further.

## 2020-05-08 DIAGNOSIS — M47816 Spondylosis without myelopathy or radiculopathy, lumbar region: Secondary | ICD-10-CM | POA: Diagnosis not present

## 2020-05-08 DIAGNOSIS — M5136 Other intervertebral disc degeneration, lumbar region: Secondary | ICD-10-CM | POA: Diagnosis not present

## 2020-05-08 DIAGNOSIS — M25561 Pain in right knee: Secondary | ICD-10-CM | POA: Diagnosis not present

## 2020-05-08 DIAGNOSIS — G894 Chronic pain syndrome: Secondary | ICD-10-CM | POA: Diagnosis not present

## 2020-05-12 ENCOUNTER — Other Ambulatory Visit: Payer: Self-pay

## 2020-05-12 ENCOUNTER — Ambulatory Visit (INDEPENDENT_AMBULATORY_CARE_PROVIDER_SITE_OTHER): Payer: Medicare Other | Admitting: Physical Medicine and Rehabilitation

## 2020-05-12 ENCOUNTER — Encounter: Payer: Self-pay | Admitting: Physical Medicine and Rehabilitation

## 2020-05-12 VITALS — BP 140/86 | HR 107

## 2020-05-12 DIAGNOSIS — M47812 Spondylosis without myelopathy or radiculopathy, cervical region: Secondary | ICD-10-CM

## 2020-05-12 DIAGNOSIS — M542 Cervicalgia: Secondary | ICD-10-CM | POA: Diagnosis not present

## 2020-05-12 DIAGNOSIS — Z981 Arthrodesis status: Secondary | ICD-10-CM | POA: Diagnosis not present

## 2020-05-12 DIAGNOSIS — M5481 Occipital neuralgia: Secondary | ICD-10-CM | POA: Diagnosis not present

## 2020-05-12 NOTE — Progress Notes (Signed)
Some days has pain from top of head to feet. Cassidy Olsen has bilateral hand swelling, bilateral knee pain. Neck pain- left side. Headaches since June- right side. Low back pain- bilateral. Left ankle pain. Bilateral shoulder pain.  Numeric Pain Rating Scale and Functional Assessment Average Pain 7 Pain Right Now 6 My pain is constant, sharp, dull and aching Pain is worse with: "pitch of noise" "feels vibrations of noise in head" Pain improves with: medication   In the last MONTH (on 0-10 scale) has pain interfered with the following?  1. General activity like being  able to carry out your everyday physical activities such as walking, climbing stairs, carrying groceries, or moving a chair?  Rating(7)  2. Relation with others like being able to carry out your usual social activities and roles such as  activities at home, at work and in your community. Rating(7)  3. Enjoyment of life such that you have  been bothered by emotional problems such as feeling anxious, depressed or irritable?  Rating(7)

## 2020-05-13 ENCOUNTER — Encounter: Payer: Self-pay | Admitting: Physical Medicine and Rehabilitation

## 2020-05-13 NOTE — Progress Notes (Signed)
Cassidy Olsen - 59 y.o. female MRN [id number]  Date of birth: [birth year]  Office Visit Note: Visit Date: 05/12/2020 PCP: Forrest Moron, MD Referred by: Forrest Moron, MD  Subjective: Chief Complaint  Patient presents with  . Neck - Pain  . Head - Pain   HPI: Cassidy Olsen is a 59 y.o. female who comes in today For new patient consultation evaluation and management at the request of Dr. Sarina Ill for occipital neuralgia and cervicalgia.  Patient's case is complicated by history of prior C6-7 ACDF with history of chronic pain syndrome on chronic opioid therapy by Dr. Dian Situ along with spine intervention by him both from the cervical and lumbar standpoint.  She also has a history of fibromyalgia.  She is currently taking Nucynta and has allergies or intolerances to several medications but most predominantly from a pain standpoint Toradol ibuprofen codeine and Lyrica.  She has been followed with headache management by Dr. Jaynee Eagles.  All of those notes were reviewed.  Patient has history of occipital neuralgia and trigeminal neuralgia as well as history of cervical spine radiculopathy treated with cervical laminectomy and fusion.  Surgery was performed many years ago in [place] where she is from.  She has had diagnostic occipital blocks with good relief of her pain and typically these have lasted quite a while.  She had a recent episode where she had occipital block with good relief and then fairly soon after had increasing neck and head pain.  She actually went to the emergency room at that point and received evaluation with CT scan of the cervical spine as well as prednisone taper.  She has had CT scan of the cervical spine recently and in February she had MRI of the cervical spine.  Both of these are reviewed with the patient today with spine models and imaging and reviewed below in the notes.  She does have adjacent level disease above the cervical fusion with spondylosis and  foraminal narrowing without high-grade central stenosis.  She rates her current pain level on average is 7 out of 10.  She gets pain more on the right side but now having some left-sided pain as well the left-sided issue has been since June.  She denies any new radicular complaints but has intermittent radicular type symptoms on the right.  She has bilateral shoulder pain as well.  She also has knee pain.  She has had no focal weakness.  The symptoms are characterized as constant sharp dull and aching.  They are improved with medication.  Just as of note she is also been seen at the [place] [facility].  This was for her knee and she completed diagnostic genicular blocks.  She has not had to her knowledge radiofrequency ablation of the cervical facet joints but has had injection of her cervical spine by Dr. Dian Situ.  Review of Systems  Musculoskeletal: Positive for back pain, joint pain and neck pain.  All other systems reviewed and are negative.  Otherwise per HPI.  Assessment & Plan: Visit Diagnoses:  1. Occipital neuralgia of right side   2. Cervicalgia   3. Cervical spondylosis without myelopathy   4. S/P cervical spinal fusion     Plan: Findings:  Complicated chronic severe worsening of cervicalgia and occipital headache with diagnosis of occipital neuralgia and prior cervical fusion with adjacent level cervical spondylosis.  As we noted with the patient today referral from Dr. Jaynee Eagles to me is usually pretty specific  for the occipital neuralgia although we do look at the cervical spine as well.  Her case is complicated that she is currently in comprehensive pain management with Dr. Dian Situ for spine intervention and medication treatment and management.  I am sure that he likely does complete the same type of procedure that we do at the C2-3 third occipital nerve area.  We talked about the use of diagnostic blocks and then radiofrequency ablation.  She may have  neck pain from the adjacent level disease above the fusion.  She will return to see Dr. Dian Situ from a management standpoint to see if he felt like the injections around C2-3 would be useful to her and he can complete those.  If for some reason he does not do those but does think it may be of benefit we are happy to see her for that procedure.  She will continue her care with Dr. Jaynee Eagles and Dr. Vira Blanco.  Greater than 50% of this visit (45 min) was spent in counseling and coordination of care discussing treatment for cervical spine pain status post fusion with history of occipital neuralgia and fibromyalgia as well as concurrent treatment through chronic pain management.    Meds & Orders: No orders of the defined types were placed in this encounter.  No orders of the defined types were placed in this encounter.   Follow-up: Return if symptoms worsen or fail to improve.   Procedures: No procedures performed  No notes on file   Clinical History: CT CERVICAL SPINE WITHOUT CONTRAST    TECHNIQUE:  Multidetector CT imaging of the cervical spine was performed without  intravenous contrast. Multiplanar CT image reconstructions were also  generated.    COMPARISON: 10/30/2019    FINDINGS:  Alignment: Alignment is anatomic.    Skull base and vertebrae: No acute displaced fractures. Postsurgical  changes are seen from prior right occipital  craniotomy/mastoidectomy.    Soft tissues and spinal canal: No prevertebral fluid or swelling. No  visible canal hematoma.    Disc levels: Stable C6/C7 ACDF. Prominent anterior osteophyte at  C5/C6 unchanged. Mild facet hypertrophy most pronounced at C2-3. No  significant compressive sequela.    Upper chest: Airway is patent. Lung apices are clear.    Other: Reconstructed images demonstrate no additional findings.    IMPRESSION:  1. No acute cervical spine fracture.  2. Stable spondylosis and facet hypertrophy.      Electronically  Signed  By: Randa Ngo M.D.  On: 04/17/2020 16:58 -- MRI CERVICAL SPINE WITHOUT CONTRAST  TECHNIQUE: Multiplanar, multisequence MR imaging of the cervical spine was performed. No intravenous contrast was administered.  COMPARISON:  September 23, 2016  FINDINGS: Alignment: There is straightening of the normal cervical lordosis.  Vertebrae: The patient is status post ACDF at C6-C7 with surrounding metallic artifact. The vertebral body heights are well maintained. No fracture, marrow edema,or pathologic marrow infiltration.  Cord: Normal signal and morphology.  Posterior Fossa, vertebral arteries, paraspinal tissues:  The visualized portion of the posterior fossa is unremarkable. Normal flow voids seen within the vertebral arteries. The paraspinal soft tissues are unremarkable.  Disc levels:  C1-C2: Atlanto-axial junction is normal, without canal narrowing  C2-C3: There is a minimal disc osteophyte complex which is asymmetric to the left with uncovertebral osteophytes which causes left neural foraminal narrowing.  C3-C4: There is a disc osteophyte complex and uncovertebral osteophytes with a left paracentral disc protrusion which contacts and effaces the anterior thecal sac. There is moderate right and  mild left neural foraminal narrowing.  C4-C5: There is a disc osteophyte complex and uncovertebral osteophytes with a tiny left paracentral disc protrusion. There is moderate bilateral neural foraminal narrowing. Mild central canal stenosis is seen.  C5-C6: Disc osteophyte complex and uncovertebral osteophytes are seen which causes mild bilateral neural foraminal narrowing.  C6-C7: No significant spinal canal or neural foraminal narrowing  C7-T1: No significant spinal canal or neural foraminal narrowing  IMPRESSION: Status post ACDF at C6-C7 with surrounding metallic artifact.  Cervical spine spondylosis most notable at C3-C4 and C4-C5 with  a small left paracentral disc protrusion and moderate neural foraminal narrowing with mild central canal stenosis as described above. This is slightly progressed since the prior exam.   Electronically Signed   By: Prudencio Pair M.D.   On: 09/18/2019 21:00   She reports that she has never smoked. She has never used smokeless tobacco. No results for input(s): HGBA1C, LABURIC in the last 8760 hours.  Objective:  VS:  HT:    WT:   BMI:     BP:140/86  HR:(!) 107bpm  TEMP: ( )  RESP:  Physical Exam Vitals and nursing note reviewed.  Constitutional:      General: She is not in acute distress.    Appearance: Normal appearance. She is well-developed. She is obese.  HENT:     Head: Normocephalic and atraumatic.     Nose: Nose normal.  Eyes:     Conjunctiva/sclera: Conjunctivae normal.     Pupils: Pupils are equal, round, and reactive to light.  Cardiovascular:     Rate and Rhythm: Regular rhythm.  Pulmonary:     Effort: Pulmonary effort is normal. No respiratory distress.  Abdominal:     General: There is no distension.     Palpations: Abdomen is soft.     Tenderness: There is no guarding.  Musculoskeletal:        General: Tenderness present.     Cervical back: Neck supple. Tenderness present. No rigidity.     Right lower leg: No edema.     Left lower leg: No edema.     Comments: Cervical spine exam shows patient sitting with forward flexed cervical spine she has pain at end ranges of rotation both right and left.  She has pain with extension.  Negative Spurling's test.  Negative Hoffmann's test.  Good strength in the upper extremities bilaterally.  She does have some generalized swelling in the hands but no synovitis.  She does have positive trigger points and tender points in the paraspinal musculature of the cervical spine including the levator scapula and trapezius.  Lymphadenopathy:     Cervical: No cervical adenopathy.  Skin:    General: Skin is warm and dry.      Findings: No erythema or rash.  Neurological:     General: No focal deficit present.     Mental Status: She is alert and oriented to person, place, and time.     Sensory: No sensory deficit.     Motor: No weakness or abnormal muscle tone.     Coordination: Coordination normal.     Gait: Gait normal.  Psychiatric:        Mood and Affect: Mood normal.        Behavior: Behavior normal.        Thought Content: Thought content normal.     Ortho Exam  Imaging: No results found.  Past Medical/Family/Surgical/Social History: Medications & Allergies reviewed per EMR, new medications updated. Patient Active  Problem List   Diagnosis Date Noted  . RUQ abdominal pain 12/25/2019  . Decreased range of knee movement 09/02/2019  . 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 Constipation 09/02/2019  . Calculus, ureter 07/24/2019  . Chronic migraine without aura, with intractable migraine, so stated, with status migrainosus 05/30/2019  . Chronic asthmatic bronchitis ([place]) 05/14/2019  . Excessive daytime sleepiness 01/08/2019  . Upper airway cough syndrome 11/12/2018  . Acute bacterial sinusitis 07/17/2018  . Abnormal chest CT 07/17/2018  . Trigeminal neuralgia of right side of face 02/01/2018  . Occipital neuralgia 02/01/2018  . Chronic migraine without aura without status migrainosus, not intractable 02/01/2018  . Decreased strength 05/10/2017  . Family history of breast cancer   . Family history of prostate cancer   . Chronic left shoulder pain 09/02/2015  . Carpal tunnel syndrome 08/25/2011  . Asthma 04/14/2009  . Essential hypertension 04/14/2009  . Hypercholesterolemia 04/14/2009   Past Medical History:  Diagnosis Date  . Arthritis 2010  . Asthma   . Chronic headaches   . Chronic pain   . Family history of breast cancer   . Family history of prostate cancer   . Gallstones 2018  . GERD (gastroesophageal reflux disease)   . High cholesterol   . Hx: UTI (urinary tract  infection)   . Hypertension   . IBS (irritable bowel syndrome)   . Interstitial cystitis 2012  . Kidney stones    2020-2021  . Migraine   . Occipital neuralgia   . Osteoarthritis   . PNA (pneumonia) 2018  . Spinal stenosis   . Trigeminal neuralgia   . Trigeminal neuralgia    Family History  Problem Relation Age of Onset  . Ulcers Mother 61       peptic ulcer  . Irritable bowel syndrome Mother   . Prostate cancer Father 100  . Heart disease Father   . Irritable bowel syndrome Father   . Breast cancer Sister 19       ER+/PR+/Her2- breast cancer  . Breast cancer Paternal Grandmother   . Diabetes Paternal Grandmother   . Cancer Paternal Uncle        unknown cancers   Past Surgical History:  Procedure Laterality Date  . ABDOMINAL HYSTERECTOMY    . ANKLE SURGERY Left 05/2009  . APPENDECTOMY    . BACK SURGERY    . 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 Pershing Proud at [facility]  . BRAIN SURGERY    . CARPAL TUNNEL RELEASE  2014  . CYSTOSCOPY W/ URETERAL STENT PLACEMENT Left 07/25/2019   Procedure: CYSTOSCOPY WITH RETROGRADE PYELOGRAM/URETERAL STENT PLACEMENT;  Surgeon: Franchot Gallo, MD;  Location: [facility];  Service: Urology;  Laterality: Left;  . CYSTOSCOPY W/ URETERAL STENT REMOVAL Left 08/15/2019   Procedure: CYSTOSCOPY WITH STENT REMOVAL;  Surgeon: Franchot Gallo, MD;  Location: [facility];  Service: Urology;  Laterality: Left;  . CYSTOSCOPY WITH RETROGRADE PYELOGRAM, URETEROSCOPY AND STENT PLACEMENT Left 08/15/2019   Procedure: CYSTOSCOPY WITH RETROGRADE PYELOGRAM, URETEROSCOPY;  Surgeon: Franchot Gallo, MD;  Location: [facility];  Service: Urology;  Laterality: Left;  90 MINS  . CYSTOSCOPY WITH RETROGRADE PYELOGRAM, URETEROSCOPY AND STENT PLACEMENT Right 09/12/2019   Procedure: CYSTOSCOPY WITH RIGHT RETROGRADE PYELOGRAM  URETEROSCOPY WITH HOLMIUM LASER STONE EXTRACTION AND STENT PLACEMENT;  Surgeon: Franchot Gallo, MD;  Location: [facility];  Service: Urology;  Laterality: Right;  . HOLMIUM LASER APPLICATION Left 02/06/1656   Procedure: HOLMIUM LASER APPLICATION;  Surgeon: Franchot Gallo, MD;  Location: [facility];  Service: Urology;  Laterality: Left;  . JOINT REPLACEMENT     R knee  . KIDNEY STONE SURGERY    . OVARIAN CYST REMOVAL  05/2010  . right knee revision  2016  . ROTATOR CUFF REPAIR Right 02/2019  . SPINE SURGERY    . TUBAL LIGATION     Social History   Occupational History  . Occupation: retired  Tobacco Use  . Smoking status: Never Smoker  . Smokeless tobacco: Never Used  Vaping Use  . Vaping Use: Never used  Substance and Sexual Activity  . Alcohol use: Yes    Comment: socially  . Drug use: No  . Sexual activity: Not on file

## 2020-05-14 DIAGNOSIS — M25561 Pain in right knee: Secondary | ICD-10-CM | POA: Diagnosis not present

## 2020-05-14 DIAGNOSIS — Z96651 Presence of right artificial knee joint: Secondary | ICD-10-CM | POA: Diagnosis not present

## 2020-05-14 DIAGNOSIS — G8929 Other chronic pain: Secondary | ICD-10-CM | POA: Diagnosis not present

## 2020-05-18 DIAGNOSIS — T8484XA Pain due to internal orthopedic prosthetic devices, implants and grafts, initial encounter: Secondary | ICD-10-CM | POA: Diagnosis not present

## 2020-05-18 DIAGNOSIS — M25562 Pain in left knee: Secondary | ICD-10-CM | POA: Diagnosis not present

## 2020-05-19 NOTE — Progress Notes (Signed)
Last Botox was 02/19/2020. Dr Jaynee Eagles started Emgality at last follow up in 02/2020. 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 She was seen but reports that Dr Ernestina Patches wished to discuss with her current pain management provider (Dr Vira Blanco). She has an appt with pain management this month. She continues to have regular migraines, at lest 2-3 per week. She is using Excedrin for abortive therapy. She had a prescription for Nurtec but wasn't sure it helped. She is asking to try it again.   Consent Form Botulism Toxin Injection For Chronic Migraine    Reviewed orally with patient, additionally signature is on file:  Botulism toxin has been approved by the Federal drug administration for treatment of chronic migraine. Botulism toxin does not cure chronic migraine and it may not be effective in some patients.  The administration of botulism toxin is accomplished by injecting a small amount of toxin into the muscles of the neck and head. Dosage must be titrated for each individual. Any benefits resulting from botulism toxin tend to wear off after 3 months with a repeat injection required if benefit is to be maintained. Injections are usually done every 3-4 months with maximum effect peak achieved by about 2 or 3 weeks. Botulism toxin is expensive and you should be sure of what costs you will incur resulting from the injection.  The side effects of botulism toxin use for chronic migraine may include:   -Transient, and usually mild, facial weakness with facial injections  -Transient, and usually mild, head or neck weakness with head/neck injections  -Reduction or loss of forehead facial animation due to forehead muscle weakness  -Eyelid drooping  -Dry eye  -Pain at the site of injection or bruising at the site of injection  -Double vision  -Potential unknown long term risks   Contraindications: You should not have Botox if you are pregnant, nursing, allergic to albumin, have an  infection, skin condition, or muscle weakness at the site of the injection, or have myasthenia gravis, Lambert-Eaton syndrome, or ALS.  It is also possible that as with any injection, there may be an allergic reaction or no effect from the medication. Reduced effectiveness after repeated injections is sometimes seen and rarely infection at the injection site may occur. All care will be taken to prevent these side effects. If therapy is given over a long time, atrophy and wasting in the muscle injected may occur. Occasionally the patient's become refractory to treatment because they develop antibodies to the toxin. In this event, therapy needs to be modified.  I have read the above information and consent to the administration of botulism toxin.    BOTOX PROCEDURE NOTE FOR MIGRAINE HEADACHE  Contraindications and precautions discussed with patient(above). Aseptic procedure was observed and patient tolerated procedure. Procedure performed by Debbora Presto, FNP-C.   The condition has existed for more than 6 months, and pt does not have a diagnosis of ALS, Myasthenia Gravis or Lambert-Eaton Syndrome.  Risks and benefits of injections discussed and pt agrees to proceed with the procedure.  Written consent obtained  These injections are medically necessary. Pt  receives good benefits from these injections. These injections do not cause sedations or hallucinations which the oral therapies may cause.   Description of procedure:  The patient was placed in a sitting position. The standard protocol was used for Botox as follows, with 5 units of Botox injected at each site:  -Procerus muscle, midline injection  -Corrugator muscle, bilateral injection  -Frontalis  muscle, bilateral injection, with 2 sites each side, medial injection was performed in the upper one third of the frontalis muscle, in the region vertical from the medial inferior edge of the superior orbital rim. The lateral injection was again in  the upper one third of the forehead vertically above the lateral limbus of the cornea, 1.5 cm lateral to the medial injection site.  -Temporalis muscle injection, 4 sites, bilaterally. The first injection was 3 cm above the tragus of the ear, second injection site was 1.5 cm to 3 cm up from the first injection site in line with the tragus of the ear. The third injection site was 1.5-3 cm forward between the first 2 injection sites. The fourth injection site was 1.5 cm posterior to the second injection site. 5th site laterally in the temporalis  muscleat the level of the outer canthus.  -Occipitalis muscle injection, 3 sites, bilaterally. The first injection was done one half way between the occipital protuberance and the tip of the mastoid process behind the ear. The second injection site was done lateral and superior to the first, 1 fingerbreadth from the first injection. The third injection site was 1 fingerbreadth superiorly and medially from the first injection site.  -Cervical paraspinal muscle injection, 2 sites, bilaterally. The first injection site was 1 cm from the midline of the cervical spine, 3 cm inferior to the lower border of the occipital protuberance. The second injection site was 1.5 cm superiorly and laterally to the first injection site.  -Trapezius muscle injection was performed at 3 sites, bilaterally. The first injection site was in the upper trapezius muscle halfway between the inflection point of the neck, and the acromion. The second injection site was one half way between the acromion and the first injection site. The third injection was done between the first injection site and the inflection point of the neck.  -LS bilaterally, 5 unites each    Will return for repeat injection in 3 months.   A total of 200 units of Botox was prepared, 165 units of Botox was injected as documented above, any Botox not injected was wasted. The patient tolerated the procedure well, there  were no complications of the above procedure.

## 2020-05-20 ENCOUNTER — Telehealth: Payer: Self-pay | Admitting: Physical Medicine and Rehabilitation

## 2020-05-20 ENCOUNTER — Other Ambulatory Visit: Payer: Self-pay

## 2020-05-20 ENCOUNTER — Ambulatory Visit (INDEPENDENT_AMBULATORY_CARE_PROVIDER_SITE_OTHER): Payer: Medicare Other | Admitting: Family Medicine

## 2020-05-20 ENCOUNTER — Telehealth: Payer: Self-pay | Admitting: Pulmonary Disease

## 2020-05-20 DIAGNOSIS — G43709 Chronic migraine without aura, not intractable, without status migrainosus: Secondary | ICD-10-CM

## 2020-05-20 DIAGNOSIS — M25562 Pain in left knee: Secondary | ICD-10-CM | POA: Diagnosis not present

## 2020-05-20 MED ORDER — NURTEC 75 MG PO TBDP: 75.0000 mg | ORAL_TABLET | Freq: Every day | ORAL | 11 refills | Status: DC | PRN

## 2020-05-20 MED ORDER — MONTELUKAST SODIUM 10 MG PO TABS
10.0000 mg | ORAL_TABLET | Freq: Every day | ORAL | 0 refills | Status: DC
Start: 2020-05-20 — End: 2020-06-08

## 2020-05-20 MED ORDER — GABAPENTIN 300 MG PO CAPS: 600.0000 mg | ORAL_CAPSULE | Freq: Three times a day (TID) | ORAL | 11 refills | Status: DC

## 2020-05-20 NOTE — Progress Notes (Signed)
Botox-100unitsx2 vials Lot: [id number] Expiration: 09/2022 NDC: 3833-3832-91   0.9% Sodium Chloride- 66mL total Lot: [id number] Expiration: 05/2022 NDC: 04599-774-14  Dx: CHRONIC MIGRAINE B/B   Consent signed

## 2020-05-20 NOTE — Telephone Encounter (Signed)
Patient called requesting a call back from [place] or [place]. Patient  States some doctor's notes was not receive or a plan of care to pain management. Also Patient states wrong referral was sent. Please call patient to get all information and correct errors. Patient phone number is [phone].

## 2020-05-20 NOTE — Telephone Encounter (Signed)
Note in chart is self explanatory but should have gone to faxed or through Epic to Dr. Vira Blanco and to Dr. Jaynee Eagles. Otherwise not sure about the referral she mentions?

## 2020-05-20 NOTE — Telephone Encounter (Signed)
Please advise. I assume she means that your note from 10/5 has not yet been faxed to her pain management provider, but I do not see any new referrals placed by our office.

## 2020-05-20 NOTE — Telephone Encounter (Signed)
Spoke with the pt  She is needing refill on singulair  Has appt pending with [facility] on 10/15  Given one month supply  Nothing further needed

## 2020-05-22 ENCOUNTER — Ambulatory Visit: Payer: Medicare Other | Admitting: Pulmonary Disease

## 2020-05-22 NOTE — Telephone Encounter (Signed)
Faxed note to Dr. Vira Blanco. Called patient to advise that this has been sent and to inquire about the referral referenced in her original phone call. The patient was upset with my question and states that she did not ask for a referral. She states that the information in her note is incorrect as, although Dr. Ernestina Patches states in her note that she was referred by Dr. Jaynee Eagles, the bottom of the note lists referring provider as her PCP. I did try to explain to the patient that that portion of the note is automatically generated by our medical record system. She again stated that the information is incorrect.

## 2020-05-22 NOTE — Telephone Encounter (Signed)
Thank you, yes Epic defaults the PCP as referrring doctor on notes but my note is correct otherwise

## 2020-05-26 DIAGNOSIS — M25562 Pain in left knee: Secondary | ICD-10-CM | POA: Diagnosis not present

## 2020-05-26 DIAGNOSIS — T8484XA Pain due to internal orthopedic prosthetic devices, implants and grafts, initial encounter: Secondary | ICD-10-CM | POA: Diagnosis not present

## 2020-05-26 DIAGNOSIS — Z131 Encounter for screening for diabetes mellitus: Secondary | ICD-10-CM | POA: Diagnosis not present

## 2020-05-28 DIAGNOSIS — G8929 Other chronic pain: Secondary | ICD-10-CM | POA: Diagnosis not present

## 2020-05-28 DIAGNOSIS — Z5181 Encounter for therapeutic drug level monitoring: Secondary | ICD-10-CM | POA: Diagnosis not present

## 2020-05-28 DIAGNOSIS — Z96651 Presence of right artificial knee joint: Secondary | ICD-10-CM | POA: Diagnosis not present

## 2020-05-28 DIAGNOSIS — M25561 Pain in right knee: Secondary | ICD-10-CM | POA: Diagnosis not present

## 2020-05-28 DIAGNOSIS — Z79899 Other long term (current) drug therapy: Secondary | ICD-10-CM | POA: Diagnosis not present

## 2020-06-01 ENCOUNTER — Other Ambulatory Visit: Payer: Self-pay | Admitting: Pulmonary Disease

## 2020-06-01 MED ORDER — FLUTICASONE PROPIONATE 50 MCG/ACT NA SUSP: 2.0000 | Freq: Every day | NASAL | 11 refills | Status: DC

## 2020-06-06 DIAGNOSIS — Z20822 Contact with and (suspected) exposure to covid-19: Secondary | ICD-10-CM | POA: Diagnosis not present

## 2020-06-06 DIAGNOSIS — J029 Acute pharyngitis, unspecified: Secondary | ICD-10-CM | POA: Diagnosis not present

## 2020-06-08 ENCOUNTER — Ambulatory Visit (INDEPENDENT_AMBULATORY_CARE_PROVIDER_SITE_OTHER): Payer: Medicare Other | Admitting: Pulmonary Disease

## 2020-06-08 ENCOUNTER — Other Ambulatory Visit: Payer: Self-pay

## 2020-06-08 ENCOUNTER — Encounter: Payer: Self-pay | Admitting: Pulmonary Disease

## 2020-06-08 DIAGNOSIS — R058 Other specified cough: Secondary | ICD-10-CM

## 2020-06-08 DIAGNOSIS — J302 Other seasonal allergic rhinitis: Secondary | ICD-10-CM

## 2020-06-08 DIAGNOSIS — J069 Acute upper respiratory infection, unspecified: Secondary | ICD-10-CM | POA: Diagnosis not present

## 2020-06-08 DIAGNOSIS — J4520 Mild intermittent asthma, uncomplicated: Secondary | ICD-10-CM | POA: Diagnosis not present

## 2020-06-08 MED ORDER — FLUTICASONE PROPIONATE 50 MCG/ACT NA SUSP: 2.0000 | Freq: Every day | NASAL | 11 refills | Status: AC

## 2020-06-08 MED ORDER — BUDESONIDE-FORMOTEROL FUMARATE 80-4.5 MCG/ACT IN AERO: 2.0000 | INHALATION_SPRAY | Freq: Two times a day (BID) | RESPIRATORY_TRACT | 11 refills | Status: DC

## 2020-06-08 MED ORDER — MONTELUKAST SODIUM 10 MG PO TABS: 10.0000 mg | ORAL_TABLET | Freq: Every day | ORAL | 3 refills | Status: DC

## 2020-06-08 MED ORDER — ALBUTEROL SULFATE HFA 108 (90 BASE) MCG/ACT IN AERS: 2.0000 | INHALATION_SPRAY | Freq: Four times a day (QID) | RESPIRATORY_TRACT | 11 refills | Status: DC | PRN

## 2020-06-08 NOTE — Progress Notes (Signed)
Synopsis: Referred in September 2019 for asthma by Forrest Moron, MD  Subjective:   PATIENT ID: Cassidy Olsen GENDER: female DOB: [birth year], MRN: [id number]  Chief Complaint  Patient presents with  . Follow-up    Patient has been feeling a little horse and runny nose, fluid in ear   Seen initially for SOB. First week of august she was sick, cough, fevers, chills. She was treated with azithromycin. She has pain her left chest that she describes as pleuritic. The pain lasted for about 3 weeks. 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 The MRI revealed an abnormality in the chest.   She started to feel some better. She still has a dry cough. Her SOB is better and no issue with climbing stairs. Of note, she has had several bouts of bronchitis. Routinely, usually 1-2 times per year with bronchitis. She was told that she may have chronic bronchitis. Patient does have seasonal allergies.  She does use Flonase for allergic rhinitis.  This is usually worse in the fall and early spring time.  She denies aspirin and sensitivity.  She does have a history of asthma.  She had episodic wheezing diagnosed in her mid 20s.  She stated she was much heavier at this time.  And once losing significant amount awake her respiratory symptoms improved.  She does not have any pets, birds, dogs or cats in the home.  She is retired and works Retail buyer.She was started on Symbicort as well as as needed albuterol by her primary care provider which states has improved her symptoms.  She has not been terribly compliant with the use of Symbicort.  OV 05/30/2018: She feels like she is breathing better. She has been using symbicort regularly as well as the singulair. She does feel like this is her worst time of year. She does feel as if the medications are maybe helping. Since our last visit she had a PET scan completed for evaluation of the lung nodule found on original CT imaging.  The PET scan  showed near complete resolution of this area of the lung.  She does have a small area of groundglass remaining.  As for her bronchial symptoms she still has occasional chest tightness.  But overall has significantly improved on Symbicort.  Patient denies chest pain, fevers, sputum production today.  OV 05/13/2019: Patient was seen in June 2020 by ENT for chronic cough.  Patient was given behavioral modifications to help stop the constant throat clearing sensations.  Overall she has been doing well since her last office visit.  I have not seen her in nearly a year now.  With her current medication regimen of Singulair, Symbicort, albuterol, Flonase, antihistamine her allergy symptoms and breathing symptoms have significantly improved.  She did have a bout back in the springtime which she saw Dr. Melvyn Novas.  Overall, she needs refills of her medications today.  She does feel like she is not always compliant with her Symbicort and has been using it more as needed recently.  She did state that the fall was her most common season for significant allergy symptoms.  I encouraged her to restart her Symbicort during this time to help improve some of her symptoms.  We reviewed her pulmonary function test that was completed last week.  A FEV1 of 2.5 L, 120% predicted no significant bronchodilator response, RV 159%, RV/TLC ratio of 130, DLCO 109.  OV 06/08/2020: This is a 59 year old female here for  1 year follow-up for management of mild persistent asthma.  She does note that she has for the past 3 to 5 days upper respiratory tract symptoms.  Sore throat congestion.  Was seen in urgent care recommended symptomatic therapy.  She also had a COVID-19 test that was ordered.  At the time of the office visit the test did not return however during her office visit she received a telephone call stating that her COVID-19 test was negative.  She does state that she is slowly starting to feel a little better.  Overall denies fevers chills  night sweats.  She went to go get a Covid test because she recently just got back from [place] where she went to [place] for a concert.  She is fully vaccinated.  As for her asthma symptoms well managed with Symbicort.  She recently through the summer had only been using it as needed but as the season change she had increased to regular use of her inhaler regimen.  She continues to maintain on antihistamines plus Singulair plus Flonase.  Her cough has overall resolved since she was last seen by [place].   Past Medical History:  Diagnosis Date  . Arthritis 2010  . Asthma   . Chronic headaches   . Chronic pain   . Family history of breast cancer   . Family history of prostate cancer   . Gallstones 2018  . GERD (gastroesophageal reflux disease)   . High cholesterol   . Hx: UTI (urinary tract infection)   . Hypertension   . IBS (irritable bowel syndrome)   . Interstitial cystitis 2012  . Kidney stones    2020-2021  . Migraine   . Occipital neuralgia   . Osteoarthritis   . PNA (pneumonia) 2018  . Spinal stenosis   . Trigeminal neuralgia   . Trigeminal neuralgia      Family History  Problem Relation Age of Onset  . Ulcers Mother 83       peptic ulcer  . Irritable bowel syndrome Mother   . Prostate cancer Father 74  . Heart disease Father   . Irritable bowel syndrome Father   . Breast cancer Sister 71       ER+/PR+/Her2- breast cancer  . Breast cancer Paternal Grandmother   . Diabetes Paternal Grandmother   . Cancer Paternal Uncle        unknown cancers     Social History   Socioeconomic History  . Marital status: Single    Spouse name: Not on file  . Number of children: 1  . Years of education: Not on file  . Highest education level: Not on file  Occupational History  . Occupation: retired  Tobacco Use  . Smoking status: Never Smoker  . Smokeless tobacco: Never Used  Vaping Use  . Vaping Use: Never used  Substance and Sexual Activity  . Alcohol use: Yes     Comment: socially  . Drug use: No  . Sexual activity: Not on file  Other Topics Concern  . Not on file  Social History Narrative  . Not on file   Social Determinants of Health   Financial Resource Strain:   . Difficulty of Paying Living Expenses: Not on file  Food Insecurity:   . Worried About Charity fundraiser in the Last Year: Not on file  . Ran Out of Food in the Last Year: Not on file  Transportation Needs:   . Lack of Transportation (Medical): Not on file  .  Lack of Transportation (Non-Medical): Not on file  Physical Activity:   . Days of Exercise per Week: Not on file  . Minutes of Exercise per Session: Not on file  Stress:   . Feeling of Stress : Not on file  Social Connections:   . Frequency of Communication with Friends and Family: Not on file  . Frequency of Social Gatherings with Friends and Family: Not on file  . Attends Religious Services: Not on file  . Active Member of Clubs or Organizations: Not on file  . Attends Archivist Meetings: Not on file  . Marital Status: Not on file  Intimate Partner Violence:   . Fear of Current or Ex-Partner: Not on file  . Emotionally Abused: Not on file  . Physically Abused: Not on file  . Sexually Abused: Not on file     Allergies  Allergen Reactions  . Aspirin Other (See Comments), Nausea And Vomiting and Nausea Only    Stomach upset Avoids due to IBS Due to ibs Stomach upset Due to ibs Avoids due to IBS  . Ketorolac Tromethamine Shortness Of Breath, Itching and Other (See Comments)    IM administration ONLY  REDNESS IM administration ONLY  REDNESS  SWELLING OF THE THROAT   ORAL TORADOL CAUSED EXTREME REDNESS, ITCHINESS, DIFFICULTY BREATHING, AND FACIAL SWELLING. PATIENT ALSO DIAGNOSED WITH BELL'S PALSY.  Marland Kitchen Penicillins Hives, Itching, Rash and Other (See Comments)    Did it involve swelling of the face/tongue/throat, SOB, or low BP? No Did it involve sudden or severe rash/hives, skin peeling, or any  reaction on the inside of your mouth or nose? Yes Did you need to seek medical attention at a hospital or doctor's office? No When did it last happen?2000 If all above answers are "NO", may proceed with cephalosporin use.  Vaginal rash Vaginal rash Did it involve swelling of the face/tongue/throat, SOB, or low BP? No Did it involve sudden or severe rash/hives, skin peeling, or any reaction on the inside of your mouth or nose? Yes Did you need to seek medical attention at a hospital or doctor's office? No When did it last happen?2000 If all above answers are "NO", may proceed with cephalosporin use. Vaginal rash Vaginal rash  . Codeine Other (See Comments)    Cough meds with codeine-have withdraw symptoms when done Cough meds with codeine-have withdraw symptoms when done Cough meds with codeine-have withdraw symptoms when done  . Ibuprofen Other (See Comments) and Nausea And Vomiting    Stomach upset Avoids due to IBS Stomach upset Avoids due to IBS  . Latex Rash and Other (See Comments)    Vaginal irritation Vaginal irritation Exacerbates genital herpes Exacerbates genital herpes  . Omeprazole Magnesium Other (See Comments)    Lack of therapeutic effect Lack of therapeutic effect  . Pantoprazole Sodium Other (See Comments)    Lack of therapeutic effect Lack of therapeutic effect  . Penicillamine Rash  . Pregabalin Other (See Comments)    Weight gain Weight gain     Outpatient Medications Prior to Visit  Medication Sig Dispense Refill  . acetaminophen (TYLENOL) 325 MG tablet Take 650 mg by mouth daily.    Marland Kitchen amitriptyline (ELAVIL) 50 MG tablet Take 1 tablet (50 mg total) by mouth at bedtime. 30 tablet 0  . celecoxib (CELEBREX) 200 MG capsule Take 200 mg by mouth daily.     Marland Kitchen dexlansoprazole (DEXILANT) 60 MG capsule Take 1 capsule (60 mg total) by mouth daily. 90 capsule 3  .  dicyclomine (BENTYL) 10 MG capsule TAKE 2 CAPSULES BY MOUTH 3 TIMES DAILY BEFORE MEALS.  540 capsule 1  . fenofibrate (TRICOR) 145 MG tablet Take 1 tablet (145 mg total) by mouth daily. 90 tablet 1  . gabapentin (NEURONTIN) 300 MG capsule Take 2 capsules (600 mg total) by mouth 3 (three) times daily. 180 capsule 11  . Galcanezumab-gnlm (EMGALITY) 120 MG/ML SOAJ Inject 120 mg into the skin every 30 (thirty) days. 1 pen 11  . lubiprostone (AMITIZA) 24 MCG capsule Take 1 capsule (24 mcg total) by mouth 2 (two) times daily with a meal. 60 capsule 3  . methocarbamol (ROBAXIN) 500 MG tablet Take 500 mg by mouth at bedtime. Patient is taking half    . Probiotic Product (UP4 PROBIOTICS ULTRA) CAPS Take 1 capsule by mouth daily.    . Rimegepant Sulfate (NURTEC) 75 MG TBDP Take 75 mg by mouth daily as needed (take for abortive therapy of migraine, no more than 1 tablet in 24 hours or 10 per month). 10 tablet 11  . Spacer/Aero-Holding Chambers (AEROCHAMBER MV) inhaler Use as instructed 1 each 0  . tapentadol (NUCYNTA ER) 100 MG 12 hr tablet Take 150 mg by mouth every 12 (twelve) hours.     . valACYclovir (VALTREX) 1000 MG tablet Take 1 tablet (1,000 mg total) by mouth 3 (three) times daily. 21 tablet 0  . valACYclovir (VALTREX) 500 MG tablet Take 500 mg by mouth 2 (two) times daily as needed (outbreaks).     Marland Kitchen albuterol (VENTOLIN HFA) 108 (90 Base) MCG/ACT inhaler Inhale 2 puffs into the lungs every 6 (six) hours as needed for wheezing or shortness of breath. 18 g 11  . budesonide-formoterol (SYMBICORT) 80-4.5 MCG/ACT inhaler Inhale 2 puffs into the lungs every 12 (twelve) hours. (Patient taking differently: Inhale 2 puffs into the lungs 2 (two) times daily as needed (respiratory issues.). ) 1 Inhaler 5  . fluticasone (FLONASE) 50 MCG/ACT nasal spray Place 2 sprays into both nostrils daily. 16 g 11  . montelukast (SINGULAIR) 10 MG tablet Take 1 tablet (10 mg total) by mouth at bedtime. 30 tablet 0   No facility-administered medications prior to visit.    Review of Systems  Constitutional:  Negative for chills, fever, malaise/fatigue and weight loss.  HENT: Negative for hearing loss, sore throat and tinnitus.   Eyes: Negative for blurred vision and double vision.  Respiratory: Positive for cough and shortness of breath. Negative for hemoptysis, sputum production, wheezing and stridor.   Cardiovascular: Negative for chest pain, palpitations, orthopnea, leg swelling and PND.  Gastrointestinal: Negative for abdominal pain, constipation, diarrhea, heartburn, nausea and vomiting.  Genitourinary: Negative for dysuria, hematuria and urgency.  Musculoskeletal: Negative for joint pain and myalgias.  Skin: Negative for itching and rash.  Neurological: Negative for dizziness, tingling, weakness and headaches.  Endo/Heme/Allergies: Negative for environmental allergies. Does not bruise/bleed easily.  Psychiatric/Behavioral: Negative for depression. The patient is not nervous/anxious and does not have insomnia.   All other systems reviewed and are negative.    Objective:  Physical Exam Vitals reviewed.  Constitutional:      General: She is not in acute distress.    Appearance: She is well-developed. She is obese.  HENT:     Head: Normocephalic and atraumatic.  Eyes:     General: No scleral icterus.    Conjunctiva/sclera: Conjunctivae normal.     Pupils: Pupils are equal, round, and reactive to light.  Neck:     Vascular: No JVD.  Trachea: No tracheal deviation.  Cardiovascular:     Rate and Rhythm: Normal rate and regular rhythm.     Heart sounds: Normal heart sounds. No murmur heard.   Pulmonary:     Effort: Pulmonary effort is normal. No tachypnea, accessory muscle usage or respiratory distress.     Breath sounds: Normal breath sounds. No stridor. No wheezing, rhonchi or rales.  Abdominal:     General: Bowel sounds are normal. There is no distension.     Palpations: Abdomen is soft.     Tenderness: There is no abdominal tenderness.  Musculoskeletal:        General: No  tenderness.     Cervical back: Neck supple.  Lymphadenopathy:     Cervical: No cervical adenopathy.  Skin:    General: Skin is warm and dry.     Capillary Refill: Capillary refill takes less than 2 seconds.     Findings: No rash.  Neurological:     Mental Status: She is alert and oriented to person, place, and time.  Psychiatric:        Behavior: Behavior normal.      There were no vitals filed for this visit.   on RA BMI Readings from Last 3 Encounters:  04/17/20 30.78 kg/m  04/16/20 31.32 kg/m  03/10/20 31.46 kg/m   Wt Readings from Last 3 Encounters:  04/17/20 171 lb (77.6 kg)  04/16/20 174 lb (78.9 kg)  03/10/20 174 lb 12.8 oz (79.3 kg)   CBC    Component Value Date/Time   WBC 7.0 09/11/2019 1434   RBC 3.66 (L) 09/11/2019 1434   HGB 11.5 (L) 09/11/2019 1434   HCT 35.4 (L) 09/11/2019 1434   PLT 340 09/11/2019 1434   MCV 96.7 09/11/2019 1434   MCH 31.4 09/11/2019 1434   MCHC 32.5 09/11/2019 1434   RDW 13.9 09/11/2019 1434   LYMPHSABS 3,653 04/18/2018 1412   MONOABS 0.6 11/18/2016 2303   EOSABS 211 04/18/2018 1412   BASOSABS 73 04/18/2018 1412    Regional Allergy Panel 04/18/2018 - Negative  IgE: 47  Chest Imaging:  CXR 04/17/2018: Left sided nodular density   Nuclear medicine PET scan: 05/21/2018: SUV max 2.2 left lower lobe bandlike opacity seen on prior CT imaging.  Much improved the last nodular masslike appearance. 4 mm right ureteropelvic junction calculus, cystic left adnexal mass no metabolic activity.    Pulmonary Functions Testing Results: PFT Results Latest Ref Rng & Units 05/03/2019  FVC-Pre L 3.14  FVC-Predicted Pre % 119  FVC-Post L 3.14  FVC-Predicted Post % 119  Pre FEV1/FVC % % 76  Post FEV1/FCV % % 79  FEV1-Pre L 2.38  FEV1-Predicted Pre % 114  FEV1-Post L 2.49  DLCO uncorrected ml/min/mmHg 21.67  DLCO UNC% % 109  DLVA Predicted % 103  TLC L 6.05  TLC % Predicted % 123  RV % Predicted % 159    FeNO: None  Pathology:  None  Echocardiogram: None  Heart Catheterization: None    Assessment & Plan:    Mild intermittent asthma, unspecified whether complicated  Seasonal allergies  Upper airway cough syndrome  Viral upper respiratory tract infection  Discussion:  This is a 59 year old female history of allergic rhinitis, mild intermittent asthma, recent COVID-19 test negative.  Negative RAST panel IgE 47.  History of community-acquired pneumonia.  Previously followed for a abnormal PET scan which was likely an inflammatory lesion which resolved on its own.  Now currently seen and managed for her upper airway  cough, chronic seasonal allergies and mild intermittent asthma symptoms.  Plan: Patient needs refills today of all of her medications. Patient was instructed on inhaler use with Symbicort. New refills given for Symbicort, albuterol, Singulair, Flonase.  Recommend 1 year follow-up or as needed.    Current Outpatient Medications:  .  acetaminophen (TYLENOL) 325 MG tablet, Take 650 mg by mouth daily., Disp: , Rfl:  .  albuterol (VENTOLIN HFA) 108 (90 Base) MCG/ACT inhaler, Inhale 2 puffs into the lungs every 6 (six) hours as needed for wheezing or shortness of breath., Disp: 18 g, Rfl: 11 .  amitriptyline (ELAVIL) 50 MG tablet, Take 1 tablet (50 mg total) by mouth at bedtime., Disp: 30 tablet, Rfl: 0 .  budesonide-formoterol (SYMBICORT) 80-4.5 MCG/ACT inhaler, Inhale 2 puffs into the lungs every 12 (twelve) hours., Disp: 1 each, Rfl: 11 .  celecoxib (CELEBREX) 200 MG capsule, Take 200 mg by mouth daily. , Disp: , Rfl:  .  dexlansoprazole (DEXILANT) 60 MG capsule, Take 1 capsule (60 mg total) by mouth daily., Disp: 90 capsule, Rfl: 3 .  dicyclomine (BENTYL) 10 MG capsule, TAKE 2 CAPSULES BY MOUTH 3 TIMES DAILY BEFORE MEALS., Disp: 540 capsule, Rfl: 1 .  fenofibrate (TRICOR) 145 MG tablet, Take 1 tablet (145 mg total) by mouth daily., Disp: 90 tablet, Rfl: 1 .  fluticasone (FLONASE) 50 MCG/ACT nasal  spray, Place 2 sprays into both nostrils daily., Disp: 16 g, Rfl: 11 .  gabapentin (NEURONTIN) 300 MG capsule, Take 2 capsules (600 mg total) by mouth 3 (three) times daily., Disp: 180 capsule, Rfl: 11 .  Galcanezumab-gnlm (EMGALITY) 120 MG/ML SOAJ, Inject 120 mg into the skin every 30 (thirty) days., Disp: 1 pen, Rfl: 11 .  lubiprostone (AMITIZA) 24 MCG capsule, Take 1 capsule (24 mcg total) by mouth 2 (two) times daily with a meal., Disp: 60 capsule, Rfl: 3 .  methocarbamol (ROBAXIN) 500 MG tablet, Take 500 mg by mouth at bedtime. Patient is taking half, Disp: , Rfl:  .  montelukast (SINGULAIR) 10 MG tablet, Take 1 tablet (10 mg total) by mouth at bedtime., Disp: 90 tablet, Rfl: 3 .  Probiotic Product (UP4 PROBIOTICS ULTRA) CAPS, Take 1 capsule by mouth daily., Disp: , Rfl:  .  Rimegepant Sulfate (NURTEC) 75 MG TBDP, Take 75 mg by mouth daily as needed (take for abortive therapy of migraine, no more than 1 tablet in 24 hours or 10 per month)., Disp: 10 tablet, Rfl: 11 .  Spacer/Aero-Holding Chambers (AEROCHAMBER MV) inhaler, Use as instructed, Disp: 1 each, Rfl: 0 .  tapentadol (NUCYNTA ER) 100 MG 12 hr tablet, Take 150 mg by mouth every 12 (twelve) hours. , Disp: , Rfl:  .  valACYclovir (VALTREX) 1000 MG tablet, Take 1 tablet (1,000 mg total) by mouth 3 (three) times daily., Disp: 21 tablet, Rfl: 0 .  valACYclovir (VALTREX) 500 MG tablet, Take 500 mg by mouth 2 (two) times daily as needed (outbreaks). , Disp: , Rfl:   I spent 30 minutes dedicated to the care of this patient on the date of this encounter to include pre-visit review of records, face-to-face time with the patient discussing conditions above, post visit ordering of testing, clinical documentation with the electronic health record, making appropriate referrals as documented, and communicating necessary findings to members of the patients care team.    Garner Nash, DO [facility] 06/08/2020 3:52 PM

## 2020-06-08 NOTE — Patient Instructions (Addendum)
Thank you for visiting Dr. Valeta Harms at [facility]. Today we recommend the following:   Meds ordered this encounter  Medications  . budesonide-formoterol (SYMBICORT) 80-4.5 MCG/ACT inhaler    Sig: Inhale 2 puffs into the lungs every 12 (twelve) hours.    Dispense:  1 each    Refill:  11  . fluticasone (FLONASE) 50 MCG/ACT nasal spray    Sig: Place 2 sprays into both nostrils daily.    Dispense:  16 g    Refill:  11  . montelukast (SINGULAIR) 10 MG tablet    Sig: Take 1 tablet (10 mg total) by mouth at bedtime.    Dispense:  90 tablet    Refill:  3  . albuterol (VENTOLIN HFA) 108 (90 Base) MCG/ACT inhaler    Sig: Inhale 2 puffs into the lungs every 6 (six) hours as needed for wheezing or shortness of breath.    Dispense:  18 g    Refill:  11   Return in about 1 year (around 06/08/2021), or if symptoms worsen or fail to improve, for with APP or Dr. Valeta Harms.    Please do your part to reduce the spread of COVID-19.

## 2020-06-11 ENCOUNTER — Other Ambulatory Visit: Payer: Self-pay | Admitting: Gastroenterology

## 2020-06-11 DIAGNOSIS — K5904 Chronic idiopathic constipation: Secondary | ICD-10-CM

## 2020-06-15 DIAGNOSIS — Z880 Allergy status to penicillin: Secondary | ICD-10-CM | POA: Diagnosis not present

## 2020-06-15 DIAGNOSIS — Z886 Allergy status to analgesic agent status: Secondary | ICD-10-CM | POA: Diagnosis not present

## 2020-06-15 DIAGNOSIS — M1711 Unilateral primary osteoarthritis, right knee: Secondary | ICD-10-CM | POA: Diagnosis not present

## 2020-06-15 DIAGNOSIS — Z79899 Other long term (current) drug therapy: Secondary | ICD-10-CM | POA: Diagnosis not present

## 2020-06-15 DIAGNOSIS — M25561 Pain in right knee: Secondary | ICD-10-CM | POA: Diagnosis not present

## 2020-06-15 DIAGNOSIS — G894 Chronic pain syndrome: Secondary | ICD-10-CM | POA: Diagnosis not present

## 2020-06-15 DIAGNOSIS — Z96651 Presence of right artificial knee joint: Secondary | ICD-10-CM | POA: Diagnosis not present

## 2020-06-15 DIAGNOSIS — Z888 Allergy status to other drugs, medicaments and biological substances status: Secondary | ICD-10-CM | POA: Diagnosis not present

## 2020-06-15 DIAGNOSIS — G43709 Chronic migraine without aura, not intractable, without status migrainosus: Secondary | ICD-10-CM | POA: Diagnosis not present

## 2020-06-15 DIAGNOSIS — M797 Fibromyalgia: Secondary | ICD-10-CM | POA: Diagnosis not present

## 2020-06-15 DIAGNOSIS — Z981 Arthrodesis status: Secondary | ICD-10-CM | POA: Diagnosis not present

## 2020-06-15 DIAGNOSIS — Z91040 Latex allergy status: Secondary | ICD-10-CM | POA: Diagnosis not present

## 2020-06-15 DIAGNOSIS — Z885 Allergy status to narcotic agent status: Secondary | ICD-10-CM | POA: Diagnosis not present

## 2020-06-19 DIAGNOSIS — M47812 Spondylosis without myelopathy or radiculopathy, cervical region: Secondary | ICD-10-CM | POA: Diagnosis not present

## 2020-06-19 DIAGNOSIS — M47816 Spondylosis without myelopathy or radiculopathy, lumbar region: Secondary | ICD-10-CM | POA: Diagnosis not present

## 2020-06-19 DIAGNOSIS — M5136 Other intervertebral disc degeneration, lumbar region: Secondary | ICD-10-CM | POA: Diagnosis not present

## 2020-06-19 DIAGNOSIS — M961 Postlaminectomy syndrome, not elsewhere classified: Secondary | ICD-10-CM | POA: Diagnosis not present

## 2020-06-24 DIAGNOSIS — Z23 Encounter for immunization: Secondary | ICD-10-CM | POA: Diagnosis not present

## 2020-07-08 ENCOUNTER — Ambulatory Visit: Payer: Medicare Other | Admitting: Gastroenterology

## 2020-07-13 ENCOUNTER — Ambulatory Visit: Payer: Medicare Other | Admitting: Physician Assistant

## 2020-07-16 ENCOUNTER — Telehealth: Payer: Self-pay | Admitting: *Deleted

## 2020-07-16 DIAGNOSIS — M25561 Pain in right knee: Secondary | ICD-10-CM | POA: Diagnosis not present

## 2020-07-16 DIAGNOSIS — G8929 Other chronic pain: Secondary | ICD-10-CM | POA: Diagnosis not present

## 2020-07-16 DIAGNOSIS — Z96651 Presence of right artificial knee joint: Secondary | ICD-10-CM | POA: Diagnosis not present

## 2020-07-16 DIAGNOSIS — G894 Chronic pain syndrome: Secondary | ICD-10-CM | POA: Diagnosis not present

## 2020-07-16 DIAGNOSIS — M792 Neuralgia and neuritis, unspecified: Secondary | ICD-10-CM | POA: Diagnosis not present

## 2020-07-16 MED ORDER — AMITRIPTYLINE HCL 50 MG PO TABS: 50.0000 mg | ORAL_TABLET | Freq: Every day | ORAL | 3 refills | Status: DC

## 2020-07-16 NOTE — Telephone Encounter (Signed)
Got refill request from [facility] for amitriptyline.  I called pt and she is taking this is back in [place].  I will refill in [facility].  She also asked about BOTOX trying to move up appt with amy, NP (sent to [facility])

## 2020-07-16 NOTE — Telephone Encounter (Signed)
I called the patient about rescheduling her appointment. Her last appointment was on 10/13. Her next scheduled appointment was 08/25/20. I advised patient that the earliest we could reschedule would be 1/13 due to insurance. I offered patient the 7:45 on that day and she agreed to come in.

## 2020-07-22 DIAGNOSIS — F4325 Adjustment disorder with mixed disturbance of emotions and conduct: Secondary | ICD-10-CM | POA: Diagnosis not present

## 2020-07-27 DIAGNOSIS — F4325 Adjustment disorder with mixed disturbance of emotions and conduct: Secondary | ICD-10-CM | POA: Diagnosis not present

## 2020-08-18 ENCOUNTER — Telehealth: Payer: Self-pay | Admitting: Family Medicine

## 2020-08-18 NOTE — Telephone Encounter (Signed)
I referred her to Dr Ernestina Patches in 04/2020. I see referral was closed. Can you tell if she was seen or if referral was denied by Dr Ernestina Patches?

## 2020-08-18 NOTE — Telephone Encounter (Signed)
Patient called today to reschedule her 1/18 Botox appointment due [place]. Sadly, her mother passed away recently and she is with family. We rescheduled for 1/27. While I was talking with patient, she mentioned that she is no longer seeing Dr. Vira Blanco for pain management. Patient states that she would like to be referred to Dr. Ernestina Patches for pain management.

## 2020-08-20 ENCOUNTER — Ambulatory Visit: Payer: Medicare Other | Admitting: Family Medicine

## 2020-08-25 ENCOUNTER — Ambulatory Visit: Payer: Medicare Other | Admitting: Family Medicine

## 2020-08-25 ENCOUNTER — Ambulatory Visit: Payer: Medicare Other | Admitting: Gastroenterology

## 2020-08-26 DIAGNOSIS — F4325 Adjustment disorder with mixed disturbance of emotions and conduct: Secondary | ICD-10-CM | POA: Diagnosis not present

## 2020-08-31 NOTE — Telephone Encounter (Signed)
I had to Re route referral I have called patient and talked to her she  will call to schedule .  Re faxed referral also telephone [phone] - fax [phone] . Patient was given the telephone number and she going to call and schedule.

## 2020-09-01 ENCOUNTER — Telehealth: Payer: Self-pay | Admitting: Physical Medicine and Rehabilitation

## 2020-09-01 NOTE — Telephone Encounter (Signed)
Pt called stating she has a referral to see Dr.Newton and would like to get that scheduled.   [phone]

## 2020-09-02 ENCOUNTER — Telehealth: Payer: Self-pay | Admitting: Family Medicine

## 2020-09-02 DIAGNOSIS — N202 Calculus of kidney with calculus of ureter: Secondary | ICD-10-CM | POA: Diagnosis not present

## 2020-09-02 NOTE — Telephone Encounter (Signed)
1) my guess is Dr. Lucia Gaskins who is ENT? In town. 2) She sees Dr. Catheryn Bacon at [facility] with notes mainly on knee. 3) Can see her for C2-3(TON) blocks leading to RFA but she might need to OV to clarify. 4) Dr. Catheryn Bacon can also do this procedure if needed

## 2020-09-02 NOTE — Telephone Encounter (Signed)
When reviewing chart for upcoming Botox visit, I see that there was a question regarding reason for referral to Dr Ernestina Patches. We are referring her for occipital pain per Dr Cathren Laine recommendation for consideration of RFA. She was being followed by Dr Vira Blanco for chronic pain (generalized) but sounds like she may not be seeing them any longer. Patient needs to discuss this with PCP if needing referral for any other conditions not followed by our office. TY.

## 2020-09-02 NOTE — Progress Notes (Addendum)
09/03/2020 ALL: She continues Emgality, amitriptyline 50mg  and gabapentin 600mg  TID. Excedrin works for abortive therapy. Nurtec helps to "take the edge off". She reports doing very well, today. Headaches have been well managed with the exception of this past week. She fell last week. She also lost her step mother. She feels that stress from these events have contributed.  She was re referred to Dr Ernestina Patches for occipital neuralgia with inconsistent relief following multiple nerve blocks/Botox treatments. She reports that she was discharged from Dr Vira Blanco, general pain management, due to too many cancellations. She is seeing [facility] for knee and shoulder pain but they have told her they do not write pain medications. Last office visit with [place] 01/2020.    05/20/2020 ALL: Last Botox was 02/19/2020. Dr Jaynee Eagles started Emgality at last follow up in 02/2020. 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 She was seen but reports that Dr Ernestina Patches wished to discuss with her current pain management provider (Dr Vira Blanco). She has an appt with pain management this month. She continues to have regular migraines, at lest 2-3 per week. She is using Excedrin for abortive therapy. She had a prescription for Nurtec but wasn't sure it helped. She is asking to try it again.    Consent Form Botulism Toxin Injection For Chronic Migraine    Reviewed orally with patient, additionally signature is on file:  Botulism toxin has been approved by the Federal drug administration for treatment of chronic migraine. Botulism toxin does not cure chronic migraine and it may not be effective in some patients.  The administration of botulism toxin is accomplished by injecting a small amount of toxin into the muscles of the neck and head. Dosage must be titrated for each individual. Any benefits resulting from botulism toxin tend to wear off after 3 months with a repeat injection required if benefit is to  be maintained. Injections are usually done every 3-4 months with maximum effect peak achieved by about 2 or 3 weeks. Botulism toxin is expensive and you should be sure of what costs you will incur resulting from the injection.  The side effects of botulism toxin use for chronic migraine may include:   -Transient, and usually mild, facial weakness with facial injections  -Transient, and usually mild, head or neck weakness with head/neck injections  -Reduction or loss of forehead facial animation due to forehead muscle weakness  -Eyelid drooping  -Dry eye  -Pain at the site of injection or bruising at the site of injection  -Double vision  -Potential unknown long term risks   Contraindications: You should not have Botox if you are pregnant, nursing, allergic to albumin, have an infection, skin condition, or muscle weakness at the site of the injection, or have myasthenia gravis, Lambert-Eaton syndrome, or ALS.  It is also possible that as with any injection, there may be an allergic reaction or no effect from the medication. Reduced effectiveness after repeated injections is sometimes seen and rarely infection at the injection site may occur. All care will be taken to prevent these side effects. If therapy is given over a long time, atrophy and wasting in the muscle injected may occur. Occasionally the patient's become refractory to treatment because they develop antibodies to the toxin. In this event, therapy needs to be modified.  I have read the above information and consent to the administration of botulism toxin.    BOTOX PROCEDURE NOTE FOR MIGRAINE HEADACHE  Contraindications and precautions discussed with patient(above).  Aseptic procedure was observed and patient tolerated procedure. Procedure performed by Debbora Presto, FNP-C.   The condition has existed for more than 6 months, and pt does not have a diagnosis of ALS, Myasthenia Gravis or Lambert-Eaton Syndrome.  Risks and benefits of  injections discussed and pt agrees to proceed with the procedure.  Written consent obtained  These injections are medically necessary. Pt  receives good benefits from these injections. These injections do not cause sedations or hallucinations which the oral therapies may cause.   Description of procedure:  The patient was placed in a sitting position. The standard protocol was used for Botox as follows, with 5 units of Botox injected at each site:  -Procerus muscle, midline injection  -Corrugator muscle, bilateral injection  -Frontalis muscle, bilateral injection, with 2 sites each side, medial injection was performed in the upper one third of the frontalis muscle, in the region vertical from the medial inferior edge of the superior orbital rim. The lateral injection was again in the upper one third of the forehead vertically above the lateral limbus of the cornea, 1.5 cm lateral to the medial injection site.  -Temporalis muscle injection, 4 sites, bilaterally. The first injection was 3 cm above the tragus of the ear, second injection site was 1.5 cm to 3 cm up from the first injection site in line with the tragus of the ear. The third injection site was 1.5-3 cm forward between the first 2 injection sites. The fourth injection site was 1.5 cm posterior to the second injection site. 5th site laterally in the temporalis  muscleat the level of the outer canthus.  -Occipitalis muscle injection, 3 sites, bilaterally. The first injection was done one half way between the occipital protuberance and the tip of the mastoid process behind the ear. The second injection site was done lateral and superior to the first, 1 fingerbreadth from the first injection. The third injection site was 1 fingerbreadth superiorly and medially from the first injection site.  -Cervical paraspinal muscle injection, 2 sites, bilaterally. The first injection site was 1 cm from the midline of the cervical spine, 3 cm inferior to  the lower border of the occipital protuberance. The second injection site was 1.5 cm superiorly and laterally to the first injection site.  -Trapezius muscle injection was performed at 3 sites, bilaterally. The first injection site was in the upper trapezius muscle halfway between the inflection point of the neck, and the acromion. The second injection site was one half way between the acromion and the first injection site. The third injection was done between the first injection site and the inflection point of the neck.  -LS bilaterally, 5 unites each    Will return for repeat injection in 3 months.   A total of 200 units of Botox was prepared, 165 units of Botox was injected as documented above, any Botox not injected was wasted. The patient tolerated the procedure well, there were no complications of the above procedure.  Made any corrections needed, and agree with procedure Sarina Ill, MD [facility]

## 2020-09-02 NOTE — Telephone Encounter (Signed)
Patient was referred in September for Occipital neuralgia of right side and was seen by you. She was also seeing Dr. Vira Blanco and was to find out if he could see her for that. Please also see phone message from 10/13. The referring office added a new note to her old referral stating she needs to see Dr. Ernestina Patches for ear nose and throat. I sent them a message this morning asking for clarification, but have not received a response. I do see a new message in her chart that she is being referred to you for occipital neuralgia and consideration of RFA and that she no longer sees Dr. Vira Blanco. She is seen at [facility] for a different problem. Please advise.

## 2020-09-02 NOTE — Telephone Encounter (Signed)
Awaiting clarification on referral. Message sent to referring office.

## 2020-09-03 ENCOUNTER — Encounter: Payer: Self-pay | Admitting: Family Medicine

## 2020-09-03 ENCOUNTER — Ambulatory Visit (INDEPENDENT_AMBULATORY_CARE_PROVIDER_SITE_OTHER): Payer: Medicare Other | Admitting: Family Medicine

## 2020-09-03 DIAGNOSIS — M25561 Pain in right knee: Secondary | ICD-10-CM | POA: Diagnosis not present

## 2020-09-03 DIAGNOSIS — Z20822 Contact with and (suspected) exposure to covid-19: Secondary | ICD-10-CM | POA: Diagnosis not present

## 2020-09-03 DIAGNOSIS — G43709 Chronic migraine without aura, not intractable, without status migrainosus: Secondary | ICD-10-CM | POA: Diagnosis not present

## 2020-09-03 DIAGNOSIS — M25562 Pain in left knee: Secondary | ICD-10-CM | POA: Diagnosis not present

## 2020-09-03 NOTE — Telephone Encounter (Addendum)
I see Dr. Ernestina Patches addressing referral. ENT ref by pulmonary back in 12/2018.

## 2020-09-03 NOTE — Telephone Encounter (Signed)
Called patient to discuss. She wants an OV first. Scheduled for 2/1 at 1000.

## 2020-09-03 NOTE — Progress Notes (Signed)
Botox- 100 units x 2 vial Lot: [id number] Expiration: 09/2022 NDC: 9292-4462-86  Bacteriostatic 0.9% Sodium Chloride- 78mL total [id number] Expiration: 09/2021 NDC: 90383-338-32  Dx: N19.166 B/B

## 2020-09-07 ENCOUNTER — Ambulatory Visit (HOSPITAL_COMMUNITY)
Admission: RE | Admit: 2020-09-07 | Discharge: 2020-09-07 | Disposition: A | Discharge status: Home / Self Care | Payer: Medicare Other | Source: Ambulatory Visit | Attending: Family Medicine | Admitting: Family Medicine

## 2020-09-07 ENCOUNTER — Encounter (HOSPITAL_COMMUNITY): Payer: Self-pay

## 2020-09-07 ENCOUNTER — Other Ambulatory Visit: Payer: Self-pay

## 2020-09-07 ENCOUNTER — Telehealth: Payer: Self-pay | Admitting: Family Medicine

## 2020-09-07 VITALS — BP 163/109 | HR 102 | Temp 98.8°F | Resp 20

## 2020-09-07 DIAGNOSIS — J45909 Unspecified asthma, uncomplicated: Secondary | ICD-10-CM | POA: Diagnosis not present

## 2020-09-07 DIAGNOSIS — U071 COVID-19: Secondary | ICD-10-CM | POA: Diagnosis not present

## 2020-09-07 DIAGNOSIS — B349 Viral infection, unspecified: Secondary | ICD-10-CM | POA: Diagnosis not present

## 2020-09-07 DIAGNOSIS — R0982 Postnasal drip: Secondary | ICD-10-CM | POA: Diagnosis not present

## 2020-09-07 DIAGNOSIS — R059 Cough, unspecified: Secondary | ICD-10-CM | POA: Insufficient documentation

## 2020-09-07 LAB — SARS CORONAVIRUS 2 (TAT 6-24 HRS): SARS Coronavirus 2: POSITIVE — AB

## 2020-09-07 LAB — POCT RAPID STREP A, ED / UC: Streptococcus, Group A Screen (Direct): NEGATIVE

## 2020-09-07 MED ORDER — BENZONATATE 100 MG PO CAPS: 100.0000 mg | ORAL_CAPSULE | Freq: Three times a day (TID) | ORAL | 0 refills | Status: DC | PRN

## 2020-09-07 MED ORDER — PROMETHAZINE-DM 6.25-15 MG/5ML PO SYRP: 5.0000 mL | ORAL_SOLUTION | Freq: Every evening | ORAL | 0 refills | Status: DC | PRN

## 2020-09-07 NOTE — Discharge Instructions (Signed)
We will notify you of your COVID-19 test results as they arrive and may take between 24 to 48 hours.  I encourage you to sign up for MyChart if you have not already done so as this can be the easiest way for us to communicate results to you online or through a phone app.  In the meantime, if you develop worsening symptoms including fever, chest pain, shortness of breath despite our current treatment plan then please report to the emergency room as this may be a sign of worsening status from possible COVID-19 infection. ° °Otherwise, we will manage this as a viral syndrome. For sore throat or cough try using a honey-based tea. Use 3 teaspoons of honey with juice squeezed from half lemon. Place shaved pieces of ginger into 1/2-1 cup of water and warm over stove top. Then mix the ingredients and repeat every 4 hours as needed. Please take Tylenol 500mg-650mg every 6 hours for aches and pains, fevers. Hydrate very well with at least 2 liters of water. Eat light meals such as soups to replenish electrolytes and soft fruits, veggies. Start an antihistamine like Zyrtec, Allegra or Claritin for postnasal drainage, sinus congestion.  You can take this together with pseudoephedrine (Sudafed) at a dose of 60 mg 2-3 times a day as needed for the same kind of congestion.   ° °

## 2020-09-07 NOTE — ED Provider Notes (Signed)
[place]   MRN: [id number] DOB: 06-28-1961  Subjective:   Cassidy Olsen is a 60 y.o. female presenting for 4-day history of acute onset body aches, chills, persistent productive cough, throat pain, bilateral ear pain, worse on the right, diarrhea, sinus drainage.  Patient has a history of moderate persistent asthma, takes Symbicort daily.  She wants to make sure she does not have strep as she last had an infection about 2 years ago.  Denies active chest pain, shortness of breath.  She is Covid vaccinated.  No current facility-administered medications for this encounter.  Current Outpatient Medications:  .  benzonatate (TESSALON) 100 MG capsule, Take 1-2 capsules (100-200 mg total) by mouth 3 (three) times daily as needed for cough., Disp: 60 capsule, Rfl: 0 .  promethazine-dextromethorphan (PROMETHAZINE-DM) 6.25-15 MG/5ML syrup, Take 5 mLs by mouth at bedtime as needed for cough., Disp: 100 mL, Rfl: 0 .  acetaminophen (TYLENOL) 325 MG tablet, Take 650 mg by mouth daily., Disp: , Rfl:  .  albuterol (VENTOLIN HFA) 108 (90 Base) MCG/ACT inhaler, Inhale 2 puffs into the lungs every 6 (six) hours as needed for wheezing or shortness of breath., Disp: 18 g, Rfl: 11 .  amitriptyline (ELAVIL) 50 MG tablet, Take 1 tablet (50 mg total) by mouth at bedtime., Disp: 90 tablet, Rfl: 3 .  budesonide-formoterol (SYMBICORT) 80-4.5 MCG/ACT inhaler, Inhale 2 puffs into the lungs every 12 (twelve) hours., Disp: 1 each, Rfl: 11 .  celecoxib (CELEBREX) 200 MG capsule, Take 200 mg by mouth daily. , Disp: , Rfl:  .  dexlansoprazole (DEXILANT) 60 MG capsule, Take 1 capsule (60 mg total) by mouth daily., Disp: 90 capsule, Rfl: 3 .  dicyclomine (BENTYL) 10 MG capsule, TAKE 2 CAPSULES BY MOUTH 3 TIMES DAILY BEFORE MEALS., Disp: 540 capsule, Rfl: 1 .  fenofibrate (TRICOR) 145 MG tablet, Take 1 tablet (145 mg total) by mouth daily., Disp: 90 tablet, Rfl: 1 .  fluticasone (FLONASE) 50 MCG/ACT nasal  spray, Place 2 sprays into both nostrils daily., Disp: 16 g, Rfl: 11 .  gabapentin (NEURONTIN) 300 MG capsule, Take 2 capsules (600 mg total) by mouth 3 (three) times daily., Disp: 180 capsule, Rfl: 11 .  Galcanezumab-gnlm (EMGALITY) 120 MG/ML SOAJ, Inject 120 mg into the skin every 30 (thirty) days., Disp: 1 pen, Rfl: 11 .  lubiprostone (AMITIZA) 24 MCG capsule, TAKE 1 CAPSULE (24 MCG TOTAL) BY MOUTH 2 (TWO) TIMES DAILY WITH A MEAL., Disp: 180 capsule, Rfl: 1 .  methocarbamol (ROBAXIN) 500 MG tablet, Take 500 mg by mouth at bedtime. Patient is taking half, Disp: , Rfl:  .  montelukast (SINGULAIR) 10 MG tablet, Take 1 tablet (10 mg total) by mouth at bedtime., Disp: 90 tablet, Rfl: 3 .  Probiotic Product (UP4 PROBIOTICS ULTRA) CAPS, Take 1 capsule by mouth daily., Disp: , Rfl:  .  Rimegepant Sulfate (NURTEC) 75 MG TBDP, Take 75 mg by mouth daily as needed (take for abortive therapy of migraine, no more than 1 tablet in 24 hours or 10 per month)., Disp: 10 tablet, Rfl: 11 .  Spacer/Aero-Holding Chambers (AEROCHAMBER MV) inhaler, Use as instructed, Disp: 1 each, Rfl: 0 .  tapentadol (NUCYNTA) 100 MG 12 hr tablet, Take 150 mg by mouth every 12 (twelve) hours. , Disp: , Rfl:  .  valACYclovir (VALTREX) 1000 MG tablet, Take 1 tablet (1,000 mg total) by mouth 3 (three) times daily., Disp: 21 tablet, Rfl: 0 .  valACYclovir (VALTREX) 500 MG tablet, Take 500  mg by mouth 2 (two) times daily as needed (outbreaks). , Disp: , Rfl:    Allergies  Allergen Reactions  . Aspirin Other (See Comments), Nausea And Vomiting and Nausea Only    Stomach upset Avoids due to IBS Due to ibs Stomach upset Due to ibs Avoids due to IBS  . Ketorolac Tromethamine Shortness Of Breath, Itching and Other (See Comments)    IM administration ONLY  REDNESS IM administration ONLY  REDNESS  SWELLING OF THE THROAT   ORAL TORADOL CAUSED EXTREME REDNESS, ITCHINESS, DIFFICULTY BREATHING, AND FACIAL SWELLING. PATIENT ALSO DIAGNOSED  WITH BELL'S PALSY.  Marland Kitchen Penicillins Hives, Itching, Rash and Other (See Comments)    Did it involve swelling of the face/tongue/throat, SOB, or low BP? No Did it involve sudden or severe rash/hives, skin peeling, or any reaction on the inside of your mouth or nose? Yes Did you need to seek medical attention at a hospital or doctor's office? No When did it last happen?2000 If all above answers are "NO", may proceed with cephalosporin use.  Vaginal rash Vaginal rash Did it involve swelling of the face/tongue/throat, SOB, or low BP? No Did it involve sudden or severe rash/hives, skin peeling, or any reaction on the inside of your mouth or nose? Yes Did you need to seek medical attention at a hospital or doctor's office? No When did it last happen?2000 If all above answers are "NO", may proceed with cephalosporin use. Vaginal rash Vaginal rash  . Codeine Other (See Comments)    Cough meds with codeine-have withdraw symptoms when done Cough meds with codeine-have withdraw symptoms when done Cough meds with codeine-have withdraw symptoms when done  . Ibuprofen Other (See Comments) and Nausea And Vomiting    Stomach upset Avoids due to IBS Stomach upset Avoids due to IBS  . Latex Rash and Other (See Comments)    Vaginal irritation Vaginal irritation Exacerbates genital herpes Exacerbates genital herpes  . Omeprazole Magnesium Other (See Comments)    Lack of therapeutic effect Lack of therapeutic effect  . Pantoprazole Sodium Other (See Comments)    Lack of therapeutic effect Lack of therapeutic effect  . Penicillamine Rash  . Pregabalin Other (See Comments)    Weight gain Weight gain    Past Medical History:  Diagnosis Date  . Arthritis 2010  . Asthma   . Chronic headaches   . Chronic pain   . Family history of breast cancer   . Family history of prostate cancer   . Gallstones 2018  . GERD (gastroesophageal reflux disease)   . High cholesterol   . Hx: UTI  (urinary tract infection)   . Hypertension   . IBS (irritable bowel syndrome)   . Interstitial cystitis 2012  . Kidney stones    2020-2021  . Migraine   . Occipital neuralgia   . Osteoarthritis   . PNA (pneumonia) 2018  . Spinal stenosis   . Trigeminal neuralgia   . Trigeminal neuralgia      Past Surgical History:  Procedure Laterality Date  . ABDOMINAL HYSTERECTOMY    . ANKLE SURGERY Left 05/2009  . APPENDECTOMY    . BACK SURGERY    . 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 Pershing Proud [facility]  . BRAIN SURGERY    . CARPAL TUNNEL RELEASE  2014  . CYSTOSCOPY W/ URETERAL STENT PLACEMENT Left 07/25/2019   Procedure: CYSTOSCOPY WITH RETROGRADE PYELOGRAM/URETERAL STENT PLACEMENT;  Surgeon: Franchot Gallo, MD;  Location: [facility];  Service: Urology;  Laterality: Left;  . CYSTOSCOPY W/ URETERAL STENT REMOVAL Left 08/15/2019   Procedure: CYSTOSCOPY WITH STENT REMOVAL;  Surgeon: Franchot Gallo, MD;  Location: [facility];  Service: Urology;  Laterality: Left;  . CYSTOSCOPY WITH RETROGRADE PYELOGRAM, URETEROSCOPY AND STENT PLACEMENT Left 08/15/2019   Procedure: CYSTOSCOPY WITH RETROGRADE PYELOGRAM, URETEROSCOPY;  Surgeon: Franchot Gallo, MD;  Location: [facility];  Service: Urology;  Laterality: Left;  90 MINS  . CYSTOSCOPY WITH RETROGRADE PYELOGRAM, URETEROSCOPY AND STENT PLACEMENT Right 09/12/2019   Procedure: CYSTOSCOPY WITH RIGHT RETROGRADE PYELOGRAM  URETEROSCOPY WITH HOLMIUM LASER STONE EXTRACTION AND STENT PLACEMENT;  Surgeon: Franchot Gallo, MD;  Location: [facility];  Service: Urology;  Laterality: Right;  . HOLMIUM LASER APPLICATION Left 01/14/1156   Procedure: HOLMIUM LASER APPLICATION;  Surgeon: Franchot Gallo, MD;  Location: [facility];  Service: Urology;  Laterality: Left;  . JOINT REPLACEMENT     R knee  . KIDNEY STONE SURGERY    . OVARIAN CYST REMOVAL  05/2010  . right knee revision  2016  . ROTATOR CUFF REPAIR Right 02/2019  . SPINE  SURGERY    . TUBAL LIGATION      Family History  Problem Relation Age of Onset  . Ulcers Mother 50       peptic ulcer  . Irritable bowel syndrome Mother   . Prostate cancer Father 75  . Heart disease Father   . Irritable bowel syndrome Father   . Breast cancer Sister 61       ER+/PR+/Her2- breast cancer  . Breast cancer Paternal Grandmother   . Diabetes Paternal Grandmother   . Cancer Paternal Uncle        unknown cancers    Social History   Tobacco Use  . Smoking status: Never Smoker  . Smokeless tobacco: Never Used  Vaping Use  . Vaping Use: Never used  Substance Use Topics  . Alcohol use: Yes    Comment: socially  . Drug use: No    ROS   Objective:   Vitals: BP (!) 163/109 (BP Location: Left Arm)   Pulse (!) 102   Temp 98.8 F (37.1 C) (Oral)   Resp 20   SpO2 98%   Physical Exam Constitutional:      General: She is not in acute distress.    Appearance: Normal appearance. She is well-developed. She is not ill-appearing, toxic-appearing or diaphoretic.  HENT:     Head: Normocephalic and atraumatic.     Right Ear: Tympanic membrane and ear canal normal. No drainage or tenderness. No middle ear effusion. Tympanic membrane is not erythematous.     Left Ear: Tympanic membrane and ear canal normal. No drainage or tenderness.  No middle ear effusion. Tympanic membrane is not erythematous.     Nose: Nose normal. No congestion or rhinorrhea.     Mouth/Throat:     Mouth: Mucous membranes are moist. No oral lesions.     Pharynx: Oropharynx is clear. No pharyngeal swelling, oropharyngeal exudate, posterior oropharyngeal erythema or uvula swelling.     Tonsils: No tonsillar exudate or tonsillar abscesses.  Eyes:     Extraocular Movements: Extraocular movements intact.     Right eye: Normal extraocular motion.     Left eye: Normal extraocular motion.     Conjunctiva/sclera: Conjunctivae normal.     Pupils: Pupils are equal, round, and reactive to light.   Cardiovascular:     Rate and Rhythm: Normal rate and regular rhythm.  Pulses: Normal pulses.     Heart sounds: Normal heart sounds. No murmur heard. No friction rub. No gallop.   Pulmonary:     Effort: Pulmonary effort is normal. No respiratory distress.     Breath sounds: Normal breath sounds. No stridor. No wheezing, rhonchi or rales.  Musculoskeletal:     Cervical back: Normal range of motion and neck supple.  Lymphadenopathy:     Cervical: No cervical adenopathy.  Skin:    General: Skin is warm and dry.     Findings: No rash.  Neurological:     General: No focal deficit present.     Mental Status: She is alert and oriented to person, place, and time.  Psychiatric:        Mood and Affect: Mood normal.        Behavior: Behavior normal.        Thought Content: Thought content normal.     Results for orders placed or performed during the hospital encounter of 09/07/20 (from the past 24 hour(s))  POCT Rapid Strep A     Status: None   Collection Time: 09/07/20 12:01 PM  Result Value Ref Range   Streptococcus, Group A Screen (Direct) NEGATIVE NEGATIVE    Assessment and Plan :   PDMP not reviewed this encounter.  1. Viral syndrome   2. Cough   3. Post-nasal drainage   4. Moderate asthma without complication, unspecified whether persistent     Will manage for viral illness such as viral URI, viral syndrome, viral rhinitis, COVID-19. Counseled patient on nature of COVID-19 including modes of transmission, diagnostic testing, management and supportive care.  Offered scripts for symptomatic relief. COVID 19 testing is pending. Counseled patient on potential for adverse effects with medications prescribed/recommended today, ER and return-to-clinic precautions discussed, patient verbalized understanding.     Jaynee Eagles, [place] 09/07/20 1221

## 2020-09-07 NOTE — ED Triage Notes (Signed)
Pt presents with generalized body aches, fatigue, chilss, non productive cough, diarrhea,nasal drainage,  and chills since Thursday.

## 2020-09-07 NOTE — Telephone Encounter (Signed)
Lovey Newcomer will you please put a new referral in for Ear nose and throat . Thanks So much Jayme Cloud

## 2020-09-08 ENCOUNTER — Ambulatory Visit: Payer: Medicare Other | Admitting: Physical Medicine and Rehabilitation

## 2020-09-08 ENCOUNTER — Other Ambulatory Visit (HOSPITAL_COMMUNITY): Payer: Self-pay | Admitting: Family

## 2020-09-08 ENCOUNTER — Telehealth: Payer: Self-pay | Admitting: Physical Medicine and Rehabilitation

## 2020-09-08 DIAGNOSIS — U071 COVID-19: Secondary | ICD-10-CM

## 2020-09-08 NOTE — Telephone Encounter (Signed)
Rescheduled

## 2020-09-08 NOTE — Telephone Encounter (Signed)
See other note

## 2020-09-08 NOTE — Telephone Encounter (Signed)
Left message #1

## 2020-09-08 NOTE — Progress Notes (Signed)
I connected by phone with Cassidy Olsen on 09/08/2020 at 8:06 PM to discuss the potential use of the antiviral REMDESIVIR for acute COVID-19 viral infection in non-hospitalized patients.   This patient is a 60 y.o. female that meets the FDA criteria for Emergency Use Authorization of [place].  Has a (+) direct SARS-CoV-2 viral test result  Has mild or moderate symptoms related to COVID-19  Is within 7 days of symptom onset  Has at least one of the high risk factor(s) for progression to severe COVID-19 and/or hospitalization as defined in NIH Guidelines and EUA.   Specific high risk criteria : Cardiovascular disease or hypertension   Symptoms of cramps, cough, and sore throat began 09/03/2020   I have spoken and communicated the following to the patient or parent/caregiver regarding COVID-19 IV Antiviral treatment:  1. FDA has authorized the emergency use for the treatment of mild to moderate COVID-19 in adults and pediatric patients with positive results of SARS-CoV-2 testing who are 93 years of age and older weighing at least 40 kg, and who are at high risk for progressing to severe COVID-19 and/or hospitalization.  2. The significant known and potential risks and benefits in receiving REMDESIVIR in accordance with current NIH treatment guidelines.   3. Information on available alternative treatments and the risks and benefits of those alternatives, including clinical trials that may be accessible to the patient.   4. Patients treated with antiviral therapy should continue to isolate and use infection control measures (e.g., wear mask, isolate, social distance, avoid sharing personal items, clean and disinfect "high touch" surfaces, and frequent handwashing) according to CDC guidelines.   5. The patient or parent/caregiver has the option to accept or refuse REMDESIVIR therapy and has had the opportunity to have all questions addressed prior to consenting for treatment.    After  reviewing this information with the patient, he/she has decided to proceed with the 3 day course of treatment.   All questions answered regarding medication. Discussed location and parking at length as patient had a phone call of which she needed to attend to.   Asencion Gowda, NP 09/08/2020  8:06 PM

## 2020-09-08 NOTE — Telephone Encounter (Signed)
I called pt and she had to cancel the appt today with Dr. Ernestina Patches, as pt has covid.  Margaret with Dr Kennon Portela office did send message to Dr. Kennon Portela team to call pt to set up appt (reschedule) .  No ENT appt needed (this was made in error (needed Dr. Ernestina Patches for occipital neuralgia).

## 2020-09-08 NOTE — Telephone Encounter (Signed)
Patient called needing to reschedule her appointment due to having the covid-19  Virus. The number to contact patient [phone]

## 2020-09-08 NOTE — Telephone Encounter (Signed)
Thanks [place] . Nothing . Needed . Noted .

## 2020-09-08 NOTE — Telephone Encounter (Signed)
Yes that's what I was so confused on but yes she is asking for ENT .

## 2020-09-08 NOTE — Telephone Encounter (Signed)
UP- Date -- Dr. Scherrie Gerlach and spoke to [facility] at Dr. Romona Curls office Patient has apt with Dr. Ernestina Patches Oct 5 ' th 2021 . Patient was scheduled today at 10:00 am and she called and CX . X 2 Patient has CX . Patient needs to call and reschedule . With Dr. Ernestina Patches. [phone] . Thanks [place] Dyer

## 2020-09-09 ENCOUNTER — Ambulatory Visit (HOSPITAL_COMMUNITY)
Admission: RE | Admit: 2020-09-09 | Discharge: 2020-09-09 | Disposition: A | Discharge status: Home / Self Care | Payer: Medicare Other | Source: Ambulatory Visit | Attending: Pulmonary Disease | Admitting: Pulmonary Disease

## 2020-09-09 DIAGNOSIS — U071 COVID-19: Secondary | ICD-10-CM | POA: Insufficient documentation

## 2020-09-09 LAB — CULTURE, GROUP A STREP (THRC)

## 2020-09-09 MED ORDER — SODIUM CHLORIDE 0.9 % IV SOLN
100.0000 mg | Freq: Once | INTRAVENOUS | Status: AC
  Administered 2020-09-09: 100 mg via INTRAVENOUS

## 2020-09-09 MED ORDER — SODIUM CHLORIDE 0.9 % IV SOLN: INTRAVENOUS | Status: DC | PRN

## 2020-09-09 MED ORDER — DIPHENHYDRAMINE HCL 50 MG/ML IJ SOLN: 50.0000 mg | Freq: Once | INTRAMUSCULAR | Status: DC | PRN

## 2020-09-09 MED ORDER — FAMOTIDINE IN NACL 20-0.9 MG/50ML-% IV SOLN: 20.0000 mg | Freq: Once | INTRAVENOUS | Status: DC | PRN

## 2020-09-09 MED ORDER — EPINEPHRINE 0.3 MG/0.3ML IJ SOAJ: 0.3000 mg | Freq: Once | INTRAMUSCULAR | Status: DC | PRN

## 2020-09-09 MED ORDER — ALBUTEROL SULFATE HFA 108 (90 BASE) MCG/ACT IN AERS: 2.0000 | INHALATION_SPRAY | Freq: Once | RESPIRATORY_TRACT | Status: DC | PRN

## 2020-09-09 MED ORDER — METHYLPREDNISOLONE SODIUM SUCC 125 MG IJ SOLR: 125.0000 mg | Freq: Once | INTRAMUSCULAR | Status: DC | PRN

## 2020-09-09 NOTE — Discharge Instructions (Signed)
10 Things You Can Do to Manage Your COVID-19 Symptoms at Home °If you have possible or confirmed COVID-19: °1. Stay home except to get medical care. °2. Monitor your symptoms carefully. If your symptoms get worse, call your healthcare provider immediately. °3. Get rest and stay hydrated. °4. If you have a medical appointment, call the healthcare provider ahead of time and tell them that you have or may have COVID-19. °5. For medical emergencies, call 911 and notify the dispatch personnel that you have or may have COVID-19. °6. Cover your cough and sneezes with a tissue or use the inside of your elbow. °7. Wash your hands often with soap and water for at least 20 seconds or clean your hands with an alcohol-based hand sanitizer that contains at least 60% alcohol. °8. As much as possible, stay in a specific room and away from other people in your home. Also, you should use a separate bathroom, if available. If you need to be around other people in or outside of the home, wear a mask. °9. Avoid sharing personal items with other people in your household, like dishes, towels, and bedding. °10. Clean all surfaces that are touched often, like counters, tabletops, and doorknobs. Use household cleaning sprays or wipes according to the label instructions. °cdc.gov/coronavirus °02/21/2020 °This information is not intended to replace advice given to you by your health care provider. Make sure you discuss any questions you have with your health care provider. °Document Revised: 06/08/2020 Document Reviewed: 06/08/2020 °Elsevier Patient Education © 2021 Elsevier Inc. °If you have any questions or concerns after the infusion please call the Advanced Practice Provider on call at [phone]. This number is ONLY intended for your use regarding questions or concerns about the infusion post-treatment side-effects.  Please do not provide this number to others for use. For return to work notes please contact your primary care provider.   ° °If someone you know is interested in receiving treatment please have them contact their MD for a referral or visit [email or web address] ° ° ° °

## 2020-09-09 NOTE — Progress Notes (Signed)
  Diagnosis: COVID-19  Physician: Dr. Wright   Procedure: [facility] Med: remdesivir infusion - Provided patient with remdesivir fact sheet for patients, parents and caregivers prior to infusion.  Complications: No immediate complications noted.  Discharge: Discharged home   Cassidy Olsen  B Cassidy Olsen 09/09/2020   

## 2020-09-09 NOTE — Progress Notes (Signed)
Patient reviewed Fact Sheet for Patients, Parents, and Caregivers for Emergency Use Authorization (EUA) of remdesivir for the Treatment of Coronavirus. Patient also reviewed and is agreeable to the estimated cost of treatment. Patient is agreeable to proceed.    

## 2020-09-10 ENCOUNTER — Ambulatory Visit (HOSPITAL_COMMUNITY)
Admission: RE | Admit: 2020-09-10 | Discharge: 2020-09-10 | Disposition: A | Discharge status: Home / Self Care | Payer: Medicare Other | Source: Ambulatory Visit | Attending: Pulmonary Disease | Admitting: Pulmonary Disease

## 2020-09-10 ENCOUNTER — Telehealth: Payer: Self-pay | Admitting: *Deleted

## 2020-09-10 DIAGNOSIS — U071 COVID-19: Secondary | ICD-10-CM

## 2020-09-10 MED ORDER — ALBUTEROL SULFATE HFA 108 (90 BASE) MCG/ACT IN AERS: 2.0000 | INHALATION_SPRAY | Freq: Once | RESPIRATORY_TRACT | Status: DC | PRN

## 2020-09-10 MED ORDER — METHYLPREDNISOLONE SODIUM SUCC 125 MG IJ SOLR: 125.0000 mg | Freq: Once | INTRAMUSCULAR | Status: DC | PRN

## 2020-09-10 MED ORDER — EPINEPHRINE 0.3 MG/0.3ML IJ SOAJ: 0.3000 mg | Freq: Once | INTRAMUSCULAR | Status: DC | PRN

## 2020-09-10 MED ORDER — SODIUM CHLORIDE 0.9 % IV SOLN
100.0000 mg | Freq: Once | INTRAVENOUS | Status: AC
  Administered 2020-09-10: 100 mg via INTRAVENOUS

## 2020-09-10 MED ORDER — DEXLANSOPRAZOLE 60 MG PO CPDR: 60.0000 mg | DELAYED_RELEASE_CAPSULE | Freq: Every day | ORAL | 3 refills | Status: DC

## 2020-09-10 MED ORDER — SODIUM CHLORIDE 0.9 % IV SOLN: INTRAVENOUS | Status: DC | PRN

## 2020-09-10 MED ORDER — FAMOTIDINE IN NACL 20-0.9 MG/50ML-% IV SOLN: 20.0000 mg | Freq: Once | INTRAVENOUS | Status: DC | PRN

## 2020-09-10 MED ORDER — DIPHENHYDRAMINE HCL 50 MG/ML IJ SOLN: 50.0000 mg | Freq: Once | INTRAMUSCULAR | Status: DC | PRN

## 2020-09-10 NOTE — Progress Notes (Signed)
  Diagnosis: COVID-19  Physician: Dr. Wright   Procedure: [facility] Med: remdesivir infusion - Provided patient with remdesivir fact sheet for patients, parents and caregivers prior to infusion.  Complications: No immediate complications noted.  Discharge: Discharged home   Cassidy Olsen  Cassidy Olsen 09/10/2020   

## 2020-09-10 NOTE — Discharge Instructions (Signed)
10 Things You Can Do to Manage Your COVID-19 Symptoms at Home °If you have possible or confirmed COVID-19: °1. Stay home except to get medical care. °2. Monitor your symptoms carefully. If your symptoms get worse, call your healthcare provider immediately. °3. Get rest and stay hydrated. °4. If you have a medical appointment, call the healthcare provider ahead of time and tell them that you have or may have COVID-19. °5. For medical emergencies, call 911 and notify the dispatch personnel that you have or may have COVID-19. °6. Cover your cough and sneezes with a tissue or use the inside of your elbow. °7. Wash your hands often with soap and water for at least 20 seconds or clean your hands with an alcohol-based hand sanitizer that contains at least 60% alcohol. °8. As much as possible, stay in a specific room and away from other people in your home. Also, you should use a separate bathroom, if available. If you need to be around other people in or outside of the home, wear a mask. °9. Avoid sharing personal items with other people in your household, like dishes, towels, and bedding. °10. Clean all surfaces that are touched often, like counters, tabletops, and doorknobs. Use household cleaning sprays or wipes according to the label instructions. °cdc.gov/coronavirus °02/21/2020 °This information is not intended to replace advice given to you by your health care provider. Make sure you discuss any questions you have with your health care provider. °Document Revised: 06/08/2020 Document Reviewed: 06/08/2020 °Elsevier Patient Education © 2021 Elsevier Inc. °If you have any questions or concerns after the infusion please call the Advanced Practice Provider on call at [phone]. This number is ONLY intended for your use regarding questions or concerns about the infusion post-treatment side-effects.  Please do not provide this number to others for use. For return to work notes please contact your primary care provider.   ° °If someone you know is interested in receiving treatment please have them contact their MD for a referral or visit [email or web address] ° ° ° °

## 2020-09-10 NOTE — Telephone Encounter (Signed)
Sent Dexilant refills per fax request from pharmacy

## 2020-09-11 ENCOUNTER — Ambulatory Visit (HOSPITAL_COMMUNITY): Payer: Medicare Other

## 2020-09-11 ENCOUNTER — Ambulatory Visit (HOSPITAL_COMMUNITY)
Admission: RE | Admit: 2020-09-11 | Discharge: 2020-09-11 | Disposition: A | Discharge status: Home / Self Care | Payer: Medicare Other | Source: Ambulatory Visit | Attending: Pulmonary Disease | Admitting: Pulmonary Disease

## 2020-09-11 DIAGNOSIS — J1282 Pneumonia due to coronavirus disease 2019: Secondary | ICD-10-CM | POA: Insufficient documentation

## 2020-09-11 DIAGNOSIS — U071 COVID-19: Secondary | ICD-10-CM | POA: Diagnosis not present

## 2020-09-11 DIAGNOSIS — J1289 Other viral pneumonia: Secondary | ICD-10-CM | POA: Insufficient documentation

## 2020-09-11 MED ORDER — DIPHENHYDRAMINE HCL 50 MG/ML IJ SOLN: 50.0000 mg | Freq: Once | INTRAMUSCULAR | Status: DC | PRN

## 2020-09-11 MED ORDER — ALBUTEROL SULFATE HFA 108 (90 BASE) MCG/ACT IN AERS: 2.0000 | INHALATION_SPRAY | Freq: Once | RESPIRATORY_TRACT | Status: DC | PRN

## 2020-09-11 MED ORDER — FAMOTIDINE IN NACL 20-0.9 MG/50ML-% IV SOLN: 20.0000 mg | Freq: Once | INTRAVENOUS | Status: DC | PRN

## 2020-09-11 MED ORDER — SODIUM CHLORIDE 0.9 % IV SOLN: INTRAVENOUS | Status: DC | PRN

## 2020-09-11 MED ORDER — EPINEPHRINE 0.3 MG/0.3ML IJ SOAJ: 0.3000 mg | Freq: Once | INTRAMUSCULAR | Status: DC | PRN

## 2020-09-11 MED ORDER — SODIUM CHLORIDE 0.9 % IV SOLN
100.0000 mg | Freq: Once | INTRAVENOUS | Status: AC
  Administered 2020-09-11: 100 mg via INTRAVENOUS

## 2020-09-11 MED ORDER — METHYLPREDNISOLONE SODIUM SUCC 125 MG IJ SOLR: 125.0000 mg | Freq: Once | INTRAMUSCULAR | Status: DC | PRN

## 2020-09-11 NOTE — Progress Notes (Signed)
  Diagnosis: COVID-19  Physician: Asencion Noble, MD  Procedure: [facility] Med: remdesivir infusion - Provided patient with remdesivir fact sheet for patients, parents and caregivers prior to infusion.  Complications: No immediate complications noted.  Discharge: Discharged home   Cassidy Olsen 09/11/2020

## 2020-09-11 NOTE — Discharge Instructions (Signed)
10 Things You Can Do to Manage Your COVID-19 Symptoms at Home °If you have possible or confirmed COVID-19: °1. Stay home except to get medical care. °2. Monitor your symptoms carefully. If your symptoms get worse, call your healthcare provider immediately. °3. Get rest and stay hydrated. °4. If you have a medical appointment, call the healthcare provider ahead of time and tell them that you have or may have COVID-19. °5. For medical emergencies, call 911 and notify the dispatch personnel that you have or may have COVID-19. °6. Cover your cough and sneezes with a tissue or use the inside of your elbow. °7. Wash your hands often with soap and water for at least 20 seconds or clean your hands with an alcohol-based hand sanitizer that contains at least 60% alcohol. °8. As much as possible, stay in a specific room and away from other people in your home. Also, you should use a separate bathroom, if available. If you need to be around other people in or outside of the home, wear a mask. °9. Avoid sharing personal items with other people in your household, like dishes, towels, and bedding. °10. Clean all surfaces that are touched often, like counters, tabletops, and doorknobs. Use household cleaning sprays or wipes according to the label instructions. °cdc.gov/coronavirus °02/21/2020 °This information is not intended to replace advice given to you by your health care provider. Make sure you discuss any questions you have with your health care provider. °Document Revised: 06/08/2020 Document Reviewed: 06/08/2020 °Elsevier Patient Education © 2021 Elsevier Inc. °If you have any questions or concerns after the infusion please call the Advanced Practice Provider on call at [phone]. This number is ONLY intended for your use regarding questions or concerns about the infusion post-treatment side-effects.  Please do not provide this number to others for use. For return to work notes please contact your primary care provider.   ° °If someone you know is interested in receiving treatment please have them contact their MD for a referral or visit [email or web address] ° ° ° °

## 2020-09-17 ENCOUNTER — Ambulatory Visit (HOSPITAL_COMMUNITY)
Admission: EM | Admit: 2020-09-17 | Discharge: 2020-09-17 | Disposition: A | Discharge status: Home / Self Care | Payer: Medicare Other | Attending: Internal Medicine | Admitting: Internal Medicine

## 2020-09-17 ENCOUNTER — Other Ambulatory Visit: Payer: Self-pay

## 2020-09-17 ENCOUNTER — Encounter (HOSPITAL_COMMUNITY): Payer: Self-pay

## 2020-09-17 DIAGNOSIS — J029 Acute pharyngitis, unspecified: Secondary | ICD-10-CM

## 2020-09-17 MED ORDER — MOUTHWASH COMPOUNDING BASE PO LIQD: 15.0000 mL | Freq: Four times a day (QID) | ORAL | 0 refills | Status: DC | PRN

## 2020-09-17 NOTE — ED Notes (Signed)
Pt refused to gives information about why she is here for when asking questions for triage. I offered the patient another coworker will do the triage as she is refusing to speak with me and I left the room and other coworker will triage the pt.

## 2020-09-17 NOTE — ED Triage Notes (Signed)
Pt presents with sore throat, cough, sneezing, ear pain x 2 weeks. Denies fever, SOB.    Pt tested positive for COVID on 09/07/2020

## 2020-09-17 NOTE — Discharge Instructions (Signed)
Take medications as directed Increase oral fluid intake Tylenol/Motrin as needed for pain or headaches Follow-up with primary care physician.

## 2020-09-17 NOTE — ED Provider Notes (Signed)
[place]    CSN: [id number] Arrival date & time: 09/17/20  1642      History   Chief Complaint Chief Complaint  Patient presents with  . Sore Throat    + COVID    HPI Cassidy Olsen is a 60 y.o. female simply diagnosed with COVID-19 infection comes to the urgent care with worsening throat pain and headaches.  Patient denies any nausea or vomiting.  She has a cough which is not productive of sputum.  No fever or chills.  She denies any difficulty swallowing.  No diarrhea.  Oral intake is fair.  Patient has regained his sense of taste and smell.   HPI  Past Medical History:  Diagnosis Date  . Arthritis 2010  . Asthma   . Chronic headaches   . Chronic pain   . Family history of breast cancer   . Family history of prostate cancer   . Gallstones 2018  . GERD (gastroesophageal reflux disease)   . High cholesterol   . Hx: UTI (urinary tract infection)   . Hypertension   . IBS (irritable bowel syndrome)   . Interstitial cystitis 2012  . Kidney stones    2020-2021  . Migraine   . Occipital neuralgia   . Osteoarthritis   . PNA (pneumonia) 2018  . Spinal stenosis   . Trigeminal neuralgia   . Trigeminal neuralgia     Patient Active Problem List   Diagnosis Date Noted  . RUQ abdominal pain 12/25/2019  . Decreased range of knee movement 09/02/2019  . 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 Constipation 09/02/2019  . Calculus, ureter 07/24/2019  . Chronic migraine without aura, with intractable migraine, so stated, with status migrainosus 05/30/2019  . Chronic asthmatic bronchitis ([place]) 05/14/2019  . Excessive daytime sleepiness 01/08/2019  . Upper airway cough syndrome 11/12/2018  . Acute bacterial sinusitis 07/17/2018  . Abnormal chest CT 07/17/2018  . Trigeminal neuralgia of right side of face 02/01/2018  . Occipital neuralgia 02/01/2018  . Chronic migraine without aura without status migrainosus, not intractable 02/01/2018  . Decreased  strength 05/10/2017  . Family history of breast cancer   . Family history of prostate cancer   . Chronic left shoulder pain 09/02/2015  . Carpal tunnel syndrome 08/25/2011  . Asthma 04/14/2009  . Essential hypertension 04/14/2009  . Hypercholesterolemia 04/14/2009    Past Surgical History:  Procedure Laterality Date  . ABDOMINAL HYSTERECTOMY    . ANKLE SURGERY Left 05/2009  . APPENDECTOMY    . BACK SURGERY    . 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 Pershing Proud at [facility]  . BRAIN SURGERY    . CARPAL TUNNEL RELEASE  2014  . CYSTOSCOPY W/ URETERAL STENT PLACEMENT Left 07/25/2019   Procedure: CYSTOSCOPY WITH RETROGRADE PYELOGRAM/URETERAL STENT PLACEMENT;  Surgeon: Franchot Gallo, MD;  Location: [facility];  Service: Urology;  Laterality: Left;  . CYSTOSCOPY W/ URETERAL STENT REMOVAL Left 08/15/2019   Procedure: CYSTOSCOPY WITH STENT REMOVAL;  Surgeon: Franchot Gallo, MD;  Location: [facility];  Service: Urology;  Laterality: Left;  . CYSTOSCOPY WITH RETROGRADE PYELOGRAM, URETEROSCOPY AND STENT PLACEMENT Left 08/15/2019   Procedure: CYSTOSCOPY WITH RETROGRADE PYELOGRAM, URETEROSCOPY;  Surgeon: Franchot Gallo, MD;  Location: [facility];  Service: Urology;  Laterality: Left;  90 MINS  . CYSTOSCOPY WITH RETROGRADE PYELOGRAM, URETEROSCOPY AND STENT PLACEMENT Right 09/12/2019   Procedure: CYSTOSCOPY WITH RIGHT RETROGRADE PYELOGRAM  URETEROSCOPY WITH HOLMIUM LASER STONE EXTRACTION AND STENT  PLACEMENT;  Surgeon: Franchot Gallo, MD;  Location: [facility];  Service: Urology;  Laterality: Right;  . HOLMIUM LASER APPLICATION Left 10/13/1694   Procedure: HOLMIUM LASER APPLICATION;  Surgeon: Franchot Gallo, MD;  Location: [facility];  Service: Urology;  Laterality: Left;  . JOINT REPLACEMENT     R knee  . KIDNEY STONE SURGERY    . OVARIAN CYST REMOVAL  05/2010  . right knee revision  2016  . ROTATOR CUFF REPAIR Right 02/2019  . SPINE SURGERY    . TUBAL LIGATION       OB History    Gravida  3   Para  1   Term      Preterm      AB  2   Living  1     SAB  1   IAB  1   Ectopic      Multiple      Live Births               Home Medications    Prior to Admission medications   Medication Sig Start Date End Date Taking? Authorizing Provider  Mouthwash Compounding Base LIQD Swish and spit 15 mLs 4 (four) times daily as needed. Benadryl 12.5 mg/mL-80 mL; Viscous lidocaine 2% - 80 mL; Maalox 80 mL 09/17/20  Yes Tamilyn Lupien, Myrene Galas, MD  acetaminophen (TYLENOL) 325 MG tablet Take 650 mg by mouth daily.    [provider]  albuterol (VENTOLIN HFA) 108 (90 Base) MCG/ACT inhaler Inhale 2 puffs into the lungs every 6 (six) hours as needed for wheezing or shortness of breath. 06/08/20   Icard, Octavio Graves, DO  amitriptyline (ELAVIL) 50 MG tablet Take 1 tablet (50 mg total) by mouth at bedtime. 07/16/20   Lomax, Amy, NP  benzonatate (TESSALON) 100 MG capsule Take 1-2 capsules (100-200 mg total) by mouth 3 (three) times daily as needed for cough. 09/07/20   Jaynee Eagles, PA-C  budesonide-formoterol (SYMBICORT) 80-4.5 MCG/ACT inhaler Inhale 2 puffs into the lungs every 12 (twelve) hours. 06/08/20 07/08/20  Garner Nash, DO  celecoxib (CELEBREX) 200 MG capsule Take 200 mg by mouth daily.     [provider]  dexlansoprazole (DEXILANT) 60 MG capsule Take 1 capsule (60 mg total) by mouth daily. 09/10/20   Mauri Pole, MD  dicyclomine (BENTYL) 10 MG capsule TAKE 2 CAPSULES BY MOUTH 3 TIMES DAILY BEFORE MEALS. 04/17/20   Mauri Pole, MD  fenofibrate (TRICOR) 145 MG tablet Take 1 tablet (145 mg total) by mouth daily. 12/09/19   Forrest Moron, MD  fluticasone (FLONASE) 50 MCG/ACT nasal spray Place 2 sprays into both nostrils daily. 06/08/20   Icard, Octavio Graves, DO  gabapentin (NEURONTIN) 300 MG capsule Take 2 capsules (600 mg total) by mouth 3 (three) times daily. 05/20/20   Lomax, Amy, NP  Galcanezumab-gnlm (EMGALITY) 120 MG/ML SOAJ  Inject 120 mg into the skin every 30 (thirty) days. 02/18/20   Melvenia Beam, MD  lubiprostone (AMITIZA) 24 MCG capsule TAKE 1 CAPSULE (24 MCG TOTAL) BY MOUTH 2 (TWO) TIMES DAILY WITH A MEAL. 06/11/20   Mauri Pole, MD  methocarbamol (ROBAXIN) 500 MG tablet Take 500 mg by mouth at bedtime. Patient is taking half 02/11/19   [provider]  montelukast (SINGULAIR) 10 MG tablet Take 1 tablet (10 mg total) by mouth at bedtime. 06/08/20 09/06/20  Garner Nash, DO  Probiotic Product (UP4 PROBIOTICS ULTRA) CAPS Take 1 capsule by mouth daily.  [provider]  promethazine-dextromethorphan (PROMETHAZINE-DM) 6.25-15 MG/5ML syrup Take 5 mLs by mouth at bedtime as needed for cough. 09/07/20   Jaynee Eagles, PA-C  Rimegepant Sulfate (NURTEC) 75 MG TBDP Take 75 mg by mouth daily as needed (take for abortive therapy of migraine, no more than 1 tablet in 24 hours or 10 per month). 05/20/20   Debbora Presto, NP  Spacer/Aero-Holding Chambers (AEROCHAMBER MV) inhaler Use as instructed 04/18/18   Icard, Octavio Graves, DO  tapentadol (NUCYNTA) 100 MG 12 hr tablet Take 150 mg by mouth every 12 (twelve) hours.     [provider]  valACYclovir (VALTREX) 1000 MG tablet Take 1 tablet (1,000 mg total) by mouth 3 (three) times daily. 03/10/20   Maximiano Coss, NP  valACYclovir (VALTREX) 500 MG tablet Take 500 mg by mouth 2 (two) times daily as needed (outbreaks).     [provider]    Family History Family History  Problem Relation Age of Onset  . Ulcers Mother 93       peptic ulcer  . Irritable bowel syndrome Mother   . Prostate cancer Father 60  . Heart disease Father   . Irritable bowel syndrome Father   . Breast cancer Sister 54       ER+/PR+/Her2- breast cancer  . Breast cancer Paternal Grandmother   . Diabetes Paternal Grandmother   . Cancer Paternal Uncle        unknown cancers    Social History Social History   Tobacco Use  . Smoking status: Never Smoker  .  Smokeless tobacco: Never Used  Vaping Use  . Vaping Use: Never used  Substance Use Topics  . Alcohol use: Yes    Comment: socially  . Drug use: No     Allergies   Aspirin, Ketorolac tromethamine, Penicillins, Codeine, Ibuprofen, Latex, Omeprazole magnesium, Pantoprazole sodium, Penicillamine, and Pregabalin   Review of Systems Review of Systems  HENT: Positive for sore throat. Negative for congestion, sinus pressure and sinus pain.   Respiratory: Negative.   Musculoskeletal: Negative.   Neurological: Positive for headaches.     Physical Exam Triage Vital Signs ED Triage Vitals  Enc Vitals Group     BP 09/17/20 1722 (!) 163/93     Pulse Rate 09/17/20 1722 100     Resp 09/17/20 1722 16     Temp 09/17/20 1722 98.8 F (37.1 C)     Temp src --      SpO2 09/17/20 1722 100 %     Weight --      Height --      Head Circumference --      Peak Flow --      Pain Score 09/17/20 1715 8     Pain Loc --      Pain Edu? --      Excl. in [place]? --    No data found.  Updated Vital Signs BP (!) 163/93 (BP Location: Right Arm)   Pulse 100   Temp 98.8 F (37.1 C)   Resp 16   SpO2 100%   Visual Acuity Right Eye Distance:   Left Eye Distance:   Bilateral Distance:    Right Eye Near:   Left Eye Near:    Bilateral Near:     Physical Exam Vitals reviewed.  HENT:     Right Ear: Tympanic membrane normal.     Left Ear: Tympanic membrane normal.     Mouth/Throat:     Mouth: Mucous membranes are moist.  Mucous membranes are pale.     Pharynx: No posterior oropharyngeal erythema.     Tonsils: No tonsillar exudate or tonsillar abscesses. 0 on the right. 0 on the left.  Cardiovascular:     Rate and Rhythm: Normal rate and regular rhythm.      UC Treatments / Results  Labs (all labs ordered are listed, but only abnormal results are displayed) Labs Reviewed - No data to display  EKG   Radiology No results found.  Procedures Procedures (including critical care  time)  Medications Ordered in UC Medications - No data to display  Initial Impression / Assessment and Plan / UC Course  I have reviewed the triage vital signs and the nursing notes.  Pertinent labs & imaging results that were available during my care of the patient were reviewed by me and considered in my medical decision making (see chart for details).     1.  Pharyngitis secondary to COVID-19 infection: Mouthwash-swish and spit every 6 hours as needed for sore throat Tylenol/Motrin as needed for headache If symptoms worsens please return to urgent care to be reevaluated. Increase oral fluid intake. Final Clinical Impressions(s) / UC Diagnoses   Final diagnoses:  Viral pharyngitis     Discharge Instructions     Take medications as directed Increase oral fluid intake Tylenol/Motrin as needed for pain or headaches Follow-up with primary care physician.    ED Prescriptions    Medication Sig Dispense Auth. Provider   Mouthwash Compounding Base LIQD Swish and spit 15 mLs 4 (four) times daily as needed. Benadryl 12.5 mg/mL-80 mL; Viscous lidocaine 2% - 80 mL; Maalox 80 mL 240 mL Emmerson Taddei, Myrene Galas, MD     PDMP not reviewed this encounter.   Chase Picket, MD 09/17/20 [phone]

## 2020-09-23 ENCOUNTER — Other Ambulatory Visit: Payer: Self-pay

## 2020-09-23 ENCOUNTER — Ambulatory Visit: Payer: Medicare Other | Admitting: Physical Medicine and Rehabilitation

## 2020-09-23 ENCOUNTER — Encounter: Payer: Self-pay | Admitting: Gastroenterology

## 2020-09-23 ENCOUNTER — Telehealth: Payer: Self-pay | Admitting: Gastroenterology

## 2020-09-23 ENCOUNTER — Ambulatory Visit (INDEPENDENT_AMBULATORY_CARE_PROVIDER_SITE_OTHER): Payer: Medicare Other | Admitting: Gastroenterology

## 2020-09-23 VITALS — HR 111 | Ht 62.5 in | Wt 169.0 lb

## 2020-09-23 DIAGNOSIS — R1011 Right upper quadrant pain: Secondary | ICD-10-CM

## 2020-09-23 DIAGNOSIS — K581 Irritable bowel syndrome with constipation: Secondary | ICD-10-CM

## 2020-09-23 DIAGNOSIS — K219 Gastro-esophageal reflux disease without esophagitis: Secondary | ICD-10-CM

## 2020-09-23 DIAGNOSIS — K8020 Calculus of gallbladder without cholecystitis without obstruction: Secondary | ICD-10-CM

## 2020-09-23 MED ORDER — DEXLANSOPRAZOLE 60 MG PO CPDR: 60.0000 mg | DELAYED_RELEASE_CAPSULE | Freq: Every day | ORAL | 3 refills | Status: DC

## 2020-09-23 MED ORDER — DEXLANSOPRAZOLE 30 MG PO CPDR: 30.0000 mg | DELAYED_RELEASE_CAPSULE | Freq: Two times a day (BID) | ORAL | 3 refills | Status: DC

## 2020-09-23 MED ORDER — SUCRALFATE 1 G PO TABS: 1.0000 g | ORAL_TABLET | Freq: Three times a day (TID) | ORAL | 1 refills | Status: DC

## 2020-09-23 MED ORDER — DEXLANSOPRAZOLE 30 MG PO CPDR: 30.0000 mg | DELAYED_RELEASE_CAPSULE | Freq: Every day | ORAL | 3 refills | Status: DC

## 2020-09-23 NOTE — Telephone Encounter (Signed)
Ok

## 2020-09-23 NOTE — Telephone Encounter (Signed)
Twice a day dexilant 30 mg was sent in for pt as requested,   FYI Dr Silverio Decamp

## 2020-09-23 NOTE — Patient Instructions (Addendum)
We have sent Dexilant 30 mg to your pharmacy   You will be contacted by [facility] in the next 2 days to arrange a Ultrasound.  The number on your caller ID will be [phone], please answer when they call.  If you have not heard from them in 2 days please call [phone] to schedule.    We will refer you to [facility] and they will contact you with an appointment  Follow up in 4 months   [employer]'s Current Therapy 2021 (pp. 213-216). [place], [place]: Elsevier.">  Gastroesophageal Reflux Disease, Adult Gastroesophageal reflux (GER) happens when acid from the stomach flows up into the tube that connects the mouth and the stomach (esophagus). Normally, food travels down the esophagus and stays in the stomach to be digested. However, when a person has GER, food and stomach acid sometimes move back up into the esophagus. If this becomes a more serious problem, the person may be diagnosed with a disease called gastroesophageal reflux disease (GERD). GERD occurs when the reflux:  Happens often.  Causes frequent or severe symptoms.  Causes problems such as damage to the esophagus. When stomach acid comes in contact with the esophagus, the acid may cause inflammation in the esophagus. Over time, GERD may create small holes (ulcers) in the lining of the esophagus. What are the causes? This condition is caused by a problem with the muscle between the esophagus and the stomach (lower esophageal sphincter, or LES). Normally, the LES muscle closes after food passes through the esophagus to the stomach. When the LES is weakened or abnormal, it does not close properly, and that allows food and stomach acid to go back up into the esophagus. The LES can be weakened by certain dietary substances, medicines, and medical conditions, including:  Tobacco use.  Pregnancy.  Having a hiatal hernia.  Alcohol use.  Certain foods and beverages, such as coffee, chocolate, onions,  and peppermint. What increases the risk? You are more likely to develop this condition if you:  Have an increased body weight.  Have a connective tissue disorder.  Take NSAIDs, such as ibuprofen. What are the signs or symptoms? Symptoms of this condition include:  Heartburn.  Difficult or painful swallowing and the feeling of having a lump in the throat.  A bitter taste in the mouth.  Bad breath and having a large amount of saliva.  Having an upset or bloated stomach and belching.  Chest pain. Different conditions can cause chest pain. Make sure you see your health care provider if you experience chest pain.  Shortness of breath or wheezing.  Ongoing (chronic) cough or a nighttime cough.  Wearing away of tooth enamel.  Weight loss. How is this diagnosed? This condition may be diagnosed based on a medical history and a physical exam. To determine if you have mild or severe GERD, your health care provider may also monitor how you respond to treatment. You may also have tests, including:  A test to examine your stomach and esophagus with a small camera (endoscopy).  A test that measures the acidity level in your esophagus.  A test that measures how much pressure is on your esophagus.  A barium swallow or modified barium swallow test to show the shape, size, and functioning of your esophagus. How is this treated? Treatment for this condition may vary depending on how severe your symptoms are. Your health care provider may recommend:  Changes to your diet.  Medicine.  Surgery. The goal of  treatment is to help relieve your symptoms and to prevent complications. Follow these instructions at home: Eating and drinking  Follow a diet as recommended by your health care provider. This may involve avoiding foods and drinks such as: ? Coffee and tea, with or without caffeine. ? Drinks that contain alcohol. ? Energy drinks and sports drinks. ? Carbonated drinks or  sodas. ? Chocolate and cocoa. ? Peppermint and mint flavorings. ? Garlic and onions. ? Horseradish. ? Spicy and acidic foods, including peppers, chili powder, curry powder, vinegar, hot sauces, and barbecue sauce. ? Citrus fruit juices and citrus fruits, such as oranges, lemons, and limes. ? Tomato-based foods, such as red sauce, chili, salsa, and pizza with red sauce. ? Fried and fatty foods, such as donuts, french fries, potato chips, and high-fat dressings. ? High-fat meats, such as hot dogs and fatty cuts of red and white meats, such as rib eye steak, sausage, ham, and bacon. ? High-fat dairy items, such as whole milk, butter, and cream cheese.  Eat small, frequent meals instead of large meals.  Avoid drinking large amounts of liquid with your meals.  Avoid eating meals during the 2-3 hours before bedtime.  Avoid lying down right after you eat.  Do not exercise right after you eat.   Lifestyle  Do not use any products that contain nicotine or tobacco. These products include cigarettes, chewing tobacco, and vaping devices, such as e-cigarettes. If you need help quitting, ask your health care provider.  Try to reduce your stress by using methods such as yoga or meditation. If you need help reducing stress, ask your health care provider.  If you are overweight, reduce your weight to an amount that is healthy for you. Ask your health care provider for guidance about a safe weight loss goal.   General instructions  Pay attention to any changes in your symptoms.  Take over-the-counter and prescription medicines only as told by your health care provider. Do not take aspirin, ibuprofen, or other NSAIDs unless your health care provider told you to take these medicines.  Wear loose-fitting clothing. Do not wear anything tight around your waist that causes pressure on your abdomen.  Raise (elevate) the head of your bed about 6 inches (15 cm). You can use a wedge to do this.  Avoid  bending over if this makes your symptoms worse.  Keep all follow-up visits. This is important. Contact a health care provider if:  You have: ? New symptoms. ? Unexplained weight loss. ? Difficulty swallowing or it hurts to swallow. ? Wheezing or a persistent cough. ? A hoarse voice.  Your symptoms do not improve with treatment. Get help right away if:  You have sudden pain in your arms, neck, jaw, teeth, or back.  You suddenly feel sweaty, dizzy, or light-headed.  You have chest pain or shortness of breath.  You vomit and the vomit is green, yellow, or black, or it looks like blood or coffee grounds.  You faint.  You have stool that is red, bloody, or black.  You cannot swallow, drink, or eat. These symptoms may represent a serious problem that is an emergency. Do not wait to see if the symptoms will go away. Get medical help right away. Call your local emergency services (911 in the [place] Do not drive yourself to the hospital. Summary  Gastroesophageal reflux happens when acid from the stomach flows up into the esophagus. GERD is a disease in which the reflux happens often, causes frequent or  severe symptoms, or causes problems such as damage to the esophagus.  Treatment for this condition may vary depending on how severe your symptoms are. Your health care provider may recommend diet and lifestyle changes, medicine, or surgery.  Contact a health care provider if you have new or worsening symptoms.  Take over-the-counter and prescription medicines only as told by your health care provider. Do not take aspirin, ibuprofen, or other NSAIDs unless your health care provider told you to do so.  Keep all follow-up visits as told by your health care provider. This is important. This information is not intended to replace advice given to you by your health care provider. Make sure you discuss any questions you have with your health care provider. Document Revised: 02/03/2020  Document Reviewed: 02/03/2020 Elsevier Patient Education  [employer].  I appreciate the  opportunity to care for you  Thank You   Harl Bowie , MD

## 2020-09-23 NOTE — Progress Notes (Signed)
Cassidy Olsen    [id number]    03/13/1961  Primary Care Physician:Stallings, Arlie Solomons, MD  Referring Physician: Forrest Moron, MD [phone] [place],   [id number]   Chief complaint:  GERD HPI:  60 year old very pleasant female here for follow up visit for GERD and right upper quadrant abdominal pain  She is having significant heartburn, she lost her prescription for Dexilant 60 mg daily, was too early to refill it with high out-of-pocket expense.  She is currently taking over-the-counter Nexium with no significant improvement.  Continues to have intermittent right upper quadrant pain on average once or twice a month, when she gets the pain she has to stop doing everything way down and it can sometimes last up to a day  She also has generalized abdominal cramping which improves with dicyclomine  Recently diagnosed Covid positive 09/07/20, ER visit on 09/17/20 with worsening headache and sore throat  She was previously followed by [facility] [Dr. Schooler]  Colonoscopy February 2018, small adenomatous colon polyp removed and medium-sized hemorrhoids  EGD February 2018 showed hiatal hernia and gastritis.  Biopsies negative for H. pylori.  Duodenal biopsies negative for celiac.  Patient had work-up in [place], HIDA scan and small bowel series insert negative for any significant pathology  Sister has celiac disease and her daughter has gluten sensitivity.  She had negative celiac testing, TTG IgA antibody was undetectable  S/p kidney stone removal 09/2019, has bilateral multiple small kidney stones was told it is residual fragments and she will eventually passed them  On Amitiza 24 mcg BID, Metamucil and MiraLAX as needed, continues to have irregular bowel habits with alternating constipation and diarrhea  Abdominal ultrasound Jan 02, 2020: 1. Gallstones without sonographic evidence for acute cholecystitis or biliary dilatation 2.  Bilateral kidney stones without hydronephrosis 3. Complicated cyst upper pole left kidney measuring 1.3 cm, could be more thoroughly characterized by MRI.  CT abdomen and pelvis with contrast December 2020 1. Obstructing 8 x 6 mm calculus at the left ureteropelvic junction resulting in mild-to-moderate left hydronephrosis with small volume perinephric fluid and stranding. Extensive left-sided urothelial enhancement and stranding raises suspicion for a superimposed infectious or inflammatory process. 2. Thickened appearance of the urinary bladder, particularly along its superior margin. Recommend correlation with urinalysis. 3. Multiple nonobstructing right renal calculi, largest measuring up to 9 mm. 4. Cholelithiasis without evidence for cholecystitis. 5. Previously seen left adnexal cyst has slightly decreased in size.   Outpatient Encounter Medications as of 09/23/2020  Medication Sig  . acetaminophen (TYLENOL) 325 MG tablet Take 650 mg by mouth daily.  Marland Kitchen albuterol (VENTOLIN HFA) 108 (90 Base) MCG/ACT inhaler Inhale 2 puffs into the lungs every 6 (six) hours as needed for wheezing or shortness of breath.  Marland Kitchen amitriptyline (ELAVIL) 50 MG tablet Take 1 tablet (50 mg total) by mouth at bedtime.  . benzonatate (TESSALON) 100 MG capsule Take 1-2 capsules (100-200 mg total) by mouth 3 (three) times daily as needed for cough.  . budesonide-formoterol (SYMBICORT) 80-4.5 MCG/ACT inhaler Inhale 2 puffs into the lungs every 12 (twelve) hours.  . celecoxib (CELEBREX) 200 MG capsule Take 200 mg by mouth daily.   Marland Kitchen dexlansoprazole (DEXILANT) 60 MG capsule Take 1 capsule (60 mg total) by mouth daily.  Marland Kitchen dicyclomine (BENTYL) 10 MG capsule TAKE 2 CAPSULES BY MOUTH 3 TIMES DAILY BEFORE MEALS.  . fenofibrate (TRICOR) 145 MG tablet Take 1  tablet (145 mg total) by mouth daily.  . fluticasone (FLONASE) 50 MCG/ACT nasal spray Place 2 sprays into both nostrils daily.  Marland Kitchen gabapentin (NEURONTIN) 300 MG capsule  Take 2 capsules (600 mg total) by mouth 3 (three) times daily.  . Galcanezumab-gnlm (EMGALITY) 120 MG/ML SOAJ Inject 120 mg into the skin every 30 (thirty) days.  Marland Kitchen lubiprostone (AMITIZA) 24 MCG capsule TAKE 1 CAPSULE (24 MCG TOTAL) BY MOUTH 2 (TWO) TIMES DAILY WITH A MEAL.  . methocarbamol (ROBAXIN) 500 MG tablet Take 500 mg by mouth at bedtime. Patient is taking half  . montelukast (SINGULAIR) 10 MG tablet Take 1 tablet (10 mg total) by mouth at bedtime.  Earley Abide Compounding Base LIQD Swish and spit 15 mLs 4 (four) times daily as needed. Benadryl 12.5 mg/mL-80 mL; Viscous lidocaine 2% - 80 mL; Maalox 80 mL  . Probiotic Product (UP4 PROBIOTICS ULTRA) CAPS Take 1 capsule by mouth daily.  . promethazine-dextromethorphan (PROMETHAZINE-DM) 6.25-15 MG/5ML syrup Take 5 mLs by mouth at bedtime as needed for cough.  . Rimegepant Sulfate (NURTEC) 75 MG TBDP Take 75 mg by mouth daily as needed (take for abortive therapy of migraine, no more than 1 tablet in 24 hours or 10 per month).  . Spacer/Aero-Holding Chambers (AEROCHAMBER MV) inhaler Use as instructed  . tapentadol (NUCYNTA) 100 MG 12 hr tablet Take 150 mg by mouth every 12 (twelve) hours.   . valACYclovir (VALTREX) 1000 MG tablet Take 1 tablet (1,000 mg total) by mouth 3 (three) times daily.  . valACYclovir (VALTREX) 500 MG tablet Take 500 mg by mouth 2 (two) times daily as needed (outbreaks).    No facility-administered encounter medications on file as of 09/23/2020.    Allergies as of 09/23/2020 - Review Complete 09/17/2020  Allergen Reaction Noted  . Aspirin Other (See Comments), Nausea And Vomiting, and Nausea Only 04/18/2013  . Ketorolac tromethamine Shortness Of Breath, Itching, and Other (See Comments) 08/14/2019  . Penicillins Hives, Itching, Rash, and Other (See Comments) 03/19/1994  . Codeine Other (See Comments) 07/17/2018  . Ibuprofen Other (See Comments) and Nausea And Vomiting 09/03/2016  . Latex Rash and Other (See Comments)  03/07/2011  . Omeprazole magnesium Other (See Comments) 12/25/2019  . Pantoprazole sodium Other (See Comments) 12/25/2019  . Penicillamine Rash 12/26/2019  . Pregabalin Other (See Comments) 12/25/2019    Past Medical History:  Diagnosis Date  . Arthritis 2010  . Asthma   . Chronic headaches   . Chronic pain   . Family history of breast cancer   . Family history of prostate cancer   . Gallstones 2018  . GERD (gastroesophageal reflux disease)   . High cholesterol   . Hx: UTI (urinary tract infection)   . Hypertension   . IBS (irritable bowel syndrome)   . Interstitial cystitis 2012  . Kidney stones    2020-2021  . Migraine   . Occipital neuralgia   . Osteoarthritis   . PNA (pneumonia) 2018  . Spinal stenosis   . Trigeminal neuralgia   . Trigeminal neuralgia     Past Surgical History:  Procedure Laterality Date  . ABDOMINAL HYSTERECTOMY    . ANKLE SURGERY Left 05/2009  . APPENDECTOMY    . BACK SURGERY    . 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 Pershing [facility]  . BRAIN SURGERY    . CARPAL TUNNEL RELEASE  2014  . CYSTOSCOPY W/ URETERAL STENT PLACEMENT Left 07/25/2019  Procedure: CYSTOSCOPY WITH RETROGRADE PYELOGRAM/URETERAL STENT PLACEMENT;  Surgeon: Franchot Gallo, MD;  Location: [facility];  Service: Urology;  Laterality: Left;  . CYSTOSCOPY W/ URETERAL STENT REMOVAL Left 08/15/2019   Procedure: CYSTOSCOPY WITH STENT REMOVAL;  Surgeon: Franchot Gallo, MD;  Location: [facility];  Service: Urology;  Laterality: Left;  . CYSTOSCOPY WITH RETROGRADE PYELOGRAM, URETEROSCOPY AND STENT PLACEMENT Left 08/15/2019   Procedure: CYSTOSCOPY WITH RETROGRADE PYELOGRAM, URETEROSCOPY;  Surgeon: Franchot Gallo, MD;  Location: [facility];  Service: Urology;  Laterality: Left;  90 MINS  . CYSTOSCOPY WITH RETROGRADE PYELOGRAM, URETEROSCOPY AND STENT PLACEMENT Right 09/12/2019   Procedure: CYSTOSCOPY WITH RIGHT RETROGRADE PYELOGRAM  URETEROSCOPY  WITH HOLMIUM LASER STONE EXTRACTION AND STENT PLACEMENT;  Surgeon: Franchot Gallo, MD;  Location: [facility];  Service: Urology;  Laterality: Right;  . HOLMIUM LASER APPLICATION Left 6/0/4540   Procedure: HOLMIUM LASER APPLICATION;  Surgeon: Franchot Gallo, MD;  Location: [facility];  Service: Urology;  Laterality: Left;  . JOINT REPLACEMENT     R knee  . KIDNEY STONE SURGERY    . OVARIAN CYST REMOVAL  05/2010  . right knee revision  2016  . ROTATOR CUFF REPAIR Right 02/2019  . SPINE SURGERY    . TUBAL LIGATION      Family History  Problem Relation Age of Onset  . Ulcers Mother 42       peptic ulcer  . Irritable bowel syndrome Mother   . Prostate cancer Father 45  . Heart disease Father   . Irritable bowel syndrome Father   . Breast cancer Sister 73       ER+/PR+/Her2- breast cancer  . Breast cancer Paternal Grandmother   . Diabetes Paternal Grandmother   . Cancer Paternal Uncle        unknown cancers    Social History   Socioeconomic History  . Marital status: Single    Spouse name: Not on file  . Number of children: 1  . Years of education: Not on file  . Highest education level: Not on file  Occupational History  . Occupation: retired  Tobacco Use  . Smoking status: Never Smoker  . Smokeless tobacco: Never Used  Vaping Use  . Vaping Use: Never used  Substance and Sexual Activity  . Alcohol use: Yes    Comment: socially  . Drug use: No  . Sexual activity: Not on file  Other Topics Concern  . Not on file  Social History Narrative  . Not on file   Social Determinants of Health   Financial Resource Strain: Not on file  Food Insecurity: Not on file  Transportation Needs: Not on file  Physical Activity: Not on file  Stress: Not on file  Social Connections: Not on file  Intimate Partner Violence: Not on file      Review of systems: All other review of systems negative except as mentioned in the HPI.   Physical Exam: Vitals:   09/23/20 0918  Pulse:  (!) 111   Body mass index is 30.42 kg/m. Gen:      No acute distress HEENT:  sclera anicteric Abd:      soft, right upper quadrant tenderness positive Murphy's no palpable masses, no distension Ext:    No edema Neuro: alert and oriented x 3 Psych: normal mood and affect  Data Reviewed:  Reviewed labs, radiology imaging, old records and pertinent past GI work up   Assessment and Plan/Recommendations:  60 year old very pleasant female with history of chronic GERD, irritable bowel  syndrome with constipation and diarrhea and right upper quadrant abdominal pain  Chronic intermittent right upper quadrant abdominal pain: History of multiple gallstones.  She had normal HIDA scan few years ago in [place] Given she continues to have persistent right upper quadrant pain and has positive Murphy's sign on exam.  We will plan to obtain right upper quadrant ultrasound to exclude acute cholecystitis Refer to GI surgery for evaluation for possible cholecystectomy  GERD: Continue Dexilant, will send in a prescription for Dexilant 30 mg daily for 30 days Antireflux measures Carafate 1 g before meals and at bedtime as needed  IBS constipation: Continue Amitiza 24 mcg twice daily Metamucil 1 tablespoon 2-3 times daily Add MiraLAX half a capful daily and titrate as needed to have 1-2 soft bowel movements daily  Abdominal cramping: Use dicyclomine 10 to 20 mg every 8 hours as needed  Return in 3 months or sooner if needed  This visit required 40 minutes of patient care (this includes precharting, chart review, review of results, face-to-face time used for counseling as well as treatment plan and follow-up. The patient was provided an opportunity to ask questions and all were answered. The patient agreed with the plan and demonstrated an understanding of the instructions.  Damaris Hippo , MD    CC: Forrest Moron, MD

## 2020-09-23 NOTE — Telephone Encounter (Signed)
Pt is requesting a call back from a nurse to see if her Nicholson could be changed to a lower dose to take twice a day for cost purposes.

## 2020-09-24 ENCOUNTER — Telehealth: Payer: Self-pay | Admitting: *Deleted

## 2020-09-25 DIAGNOSIS — G8929 Other chronic pain: Secondary | ICD-10-CM | POA: Diagnosis not present

## 2020-09-25 DIAGNOSIS — M47816 Spondylosis without myelopathy or radiculopathy, lumbar region: Secondary | ICD-10-CM | POA: Diagnosis not present

## 2020-09-25 DIAGNOSIS — Z5181 Encounter for therapeutic drug level monitoring: Secondary | ICD-10-CM | POA: Diagnosis not present

## 2020-09-25 DIAGNOSIS — M25561 Pain in right knee: Secondary | ICD-10-CM | POA: Diagnosis not present

## 2020-09-25 DIAGNOSIS — G894 Chronic pain syndrome: Secondary | ICD-10-CM | POA: Diagnosis not present

## 2020-09-25 DIAGNOSIS — M961 Postlaminectomy syndrome, not elsewhere classified: Secondary | ICD-10-CM | POA: Diagnosis not present

## 2020-09-25 DIAGNOSIS — Z79899 Other long term (current) drug therapy: Secondary | ICD-10-CM | POA: Diagnosis not present

## 2020-09-25 NOTE — Telephone Encounter (Signed)
Faxed referral to [facility] today

## 2020-09-30 DIAGNOSIS — F4325 Adjustment disorder with mixed disturbance of emotions and conduct: Secondary | ICD-10-CM | POA: Diagnosis not present

## 2020-10-06 ENCOUNTER — Ambulatory Visit: Payer: Medicare Other | Admitting: Physical Medicine and Rehabilitation

## 2020-10-14 ENCOUNTER — Other Ambulatory Visit: Payer: Self-pay

## 2020-10-14 ENCOUNTER — Ambulatory Visit (INDEPENDENT_AMBULATORY_CARE_PROVIDER_SITE_OTHER): Payer: Medicare Other | Admitting: Physical Medicine and Rehabilitation

## 2020-10-14 ENCOUNTER — Encounter: Payer: Self-pay | Admitting: Physical Medicine and Rehabilitation

## 2020-10-14 VITALS — BP 148/103 | HR 99

## 2020-10-14 DIAGNOSIS — M5481 Occipital neuralgia: Secondary | ICD-10-CM

## 2020-10-14 DIAGNOSIS — G894 Chronic pain syndrome: Secondary | ICD-10-CM

## 2020-10-14 DIAGNOSIS — M47812 Spondylosis without myelopathy or radiculopathy, cervical region: Secondary | ICD-10-CM | POA: Diagnosis not present

## 2020-10-14 DIAGNOSIS — Z981 Arthrodesis status: Secondary | ICD-10-CM | POA: Diagnosis not present

## 2020-10-14 DIAGNOSIS — M542 Cervicalgia: Secondary | ICD-10-CM | POA: Diagnosis not present

## 2020-10-14 NOTE — Progress Notes (Signed)
Cassidy Olsen - 60 y.o. female MRN [id number]  Date of birth: [birth year]  Office Visit Note: Visit Date: 10/14/2020 PCP: Forrest Moron, MD Referred by: Forrest Moron, MD  Subjective: Chief Complaint  Patient presents with   Head - Pain   HPI: Cassidy Olsen is a 60 y.o. female who comes in today For reevaluation and management of right-sided cervical pain and occipital neck pain and headache.  Please see our prior notes for further details and justification.  Brief review is that she sees Dr. Sarina Ill for headache management.  She was referred to [place] to potentially look at C2-3, third occipital nerve diagnostic blocks and ablation.  At the time she was seeing Dr. Vira Blanco for pain management services.  She is no longer is seeing him and does wish to proceed with injection at this point possible to see if the ablation would be beneficial.  Her case is complicated by the fact that she also sees Dr. Catheryn Bacon at [facility] as well as Dr. Melina Schools for her lumbar spine.  She continued to have right-sided upper neck pain and occipital pain.  She has a history of trigeminal neuralgia as well.  No real new issues since have seen her last just no resolution of her symptoms.  Her case is complicated by chronic pain syndrome as well as multiple drug intolerances.  She rates her average pain is 10 out of 10 but today right now she is doing fairly well with 3 out of 10.  Its intermittent and aching pain.  It seems to worsen with noise and bright light.  She does distinguish it from her normal migraine headaches however.  Review of Systems  Musculoskeletal:  Positive for neck pain.  Neurological:  Positive for headaches.  All other systems reviewed and are negative. Otherwise per HPI.  Assessment & Plan: Visit Diagnoses:    ICD-10-CM   1. Cervicalgia  M54.2     2. Cervical spondylosis without myelopathy  M47.812     3. Occipital neuralgia of right side   M54.81     4. S/P cervical spinal fusion  Z98.1     5. Chronic pain syndrome  G89.4        Plan: Findings:  Chronic severe and debilitating right sided upper neck pain with occipital pain and occipital headache.  She had good diagnostic relief with field occipital nerve injections by Dr. Jaynee Eagles.  We will complete diagnostic C2-3 medial branch blocks along the third occipital nerve.  If she gets good relief with that would look at second diagnostic block and potential radiofrequency ablation.  She will continue to follow-up with Dr. Jaynee Eagles for her other headache care.  She will continue to follow with [place] at [facility] for chronic pain issues.  She may wish to talk to them about potential for peripheral stimulator for occipital neuralgia.   Meds & Orders: No orders of the defined types were placed in this encounter.  No orders of the defined types were placed in this encounter.   Follow-up: Return for Right C2-3 and third occipital nerve medial branch block..   Procedures: No procedures performed      Clinical History: CT CERVICAL SPINE WITHOUT CONTRAST     TECHNIQUE:  Multidetector CT imaging of the cervical spine was performed without  intravenous contrast. Multiplanar CT image reconstructions were also  generated.     COMPARISON:  10/30/2019     FINDINGS:  Alignment: Alignment  is anatomic.     Skull base and vertebrae: No acute displaced fractures. Postsurgical  changes are seen from prior right occipital  craniotomy/mastoidectomy.     Soft tissues and spinal canal: No prevertebral fluid or swelling. No  visible canal hematoma.     Disc levels: Stable C6/C7 ACDF. Prominent anterior osteophyte at  C5/C6 unchanged. Mild facet hypertrophy most pronounced at C2-3. No  significant compressive sequela.     Upper chest: Airway is patent.  Lung apices are clear.     Other: Reconstructed images demonstrate no additional findings.     IMPRESSION:  1.  No acute cervical spine fracture.  2. Stable spondylosis and facet hypertrophy.        Electronically Signed    By: Randa Ngo M.D.    On: 04/17/2020 16:58 -- MRI CERVICAL SPINE WITHOUT CONTRAST   TECHNIQUE: Multiplanar, multisequence MR imaging of the cervical spine was performed. No intravenous contrast was administered.   COMPARISON:  September 23, 2016   FINDINGS: Alignment: There is straightening of the normal cervical lordosis.   Vertebrae: The patient is status post ACDF at C6-C7 with surrounding metallic artifact. The vertebral body heights are well maintained. No fracture, marrow edema,or pathologic marrow infiltration.   Cord: Normal signal and morphology.   Posterior Fossa, vertebral arteries, paraspinal tissues:   The visualized portion of the posterior fossa is unremarkable. Normal flow voids seen within the vertebral arteries. The paraspinal soft tissues are unremarkable.   Disc levels:   C1-C2: Atlanto-axial junction is normal, without canal narrowing   C2-C3: There is a minimal disc osteophyte complex which is asymmetric to the left with uncovertebral osteophytes which causes left neural foraminal narrowing.   C3-C4: There is a disc osteophyte complex and uncovertebral osteophytes with a left paracentral disc protrusion which contacts and effaces the anterior thecal sac. There is moderate right and mild left neural foraminal narrowing.   C4-C5: There is a disc osteophyte complex and uncovertebral osteophytes with a tiny left paracentral disc protrusion. There is moderate bilateral neural foraminal narrowing. Mild central canal stenosis is seen.   C5-C6: Disc osteophyte complex and uncovertebral osteophytes are seen which causes mild bilateral neural foraminal narrowing.   C6-C7: No significant spinal canal or neural foraminal narrowing   C7-T1: No significant spinal canal or neural foraminal narrowing   IMPRESSION: Status post ACDF at C6-C7  with surrounding metallic artifact.   Cervical spine spondylosis most notable at C3-C4 and C4-C5 with a small left paracentral disc protrusion and moderate neural foraminal narrowing with mild central canal stenosis as described above. This is slightly progressed since the prior exam.     Electronically Signed   By: Prudencio Pair M.D.   On: 09/18/2019 21:00   She reports that she has never smoked. She has never used smokeless tobacco.  Recent Labs    12/21/20 1442  HGBA1C 6.0*    Objective:  VS:  HT:    WT:   BMI:     BP:(!) 148/103  HR:99bpm  TEMP: ( )  RESP:  Physical Exam Vitals and nursing note reviewed.  Constitutional:      General: She is not in acute distress.    Appearance: Normal appearance. She is not ill-appearing.  HENT:     Head: Normocephalic and atraumatic.     Right Ear: External ear normal.     Left Ear: External ear normal.  Eyes:     Extraocular Movements: Extraocular movements intact.  Cardiovascular:  Rate and Rhythm: Normal rate.     Pulses: Normal pulses.  Musculoskeletal:     Cervical back: Tenderness present. No rigidity.     Right lower leg: No edema.     Left lower leg: No edema.     Comments: Patient has tenderness to palpation along the paraspinal musculature as well as the sternocleidomastoid insertion point.  She really has negative Tinel's over the occipital groove.  Patient has good strength in the upper extremities including 5 out of 5 strength in wrist extension long finger flexion and APB.  There is no atrophy of the hands intrinsically.  There is a negative Hoffmann's test.   Lymphadenopathy:     Cervical: No cervical adenopathy.  Skin:    Findings: No erythema, lesion or rash.  Neurological:     General: No focal deficit present.     Mental Status: She is alert and oriented to person, place, and time.     Sensory: No sensory deficit.     Motor: No weakness or abnormal muscle tone.     Coordination: Coordination normal.   Psychiatric:        Mood and Affect: Mood normal.        Behavior: Behavior normal.    Ortho Exam  Imaging: No results found.  Past Medical/Family/Surgical/Social History: Medications & Allergies reviewed per EMR, new medications updated. 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[place]) 05/14/2019   Excessive daytime sleepiness 01/08/2019   Upper airway cough syndrome 11/12/2018   Acute bacterial sinusitis 07/17/2018   Abnormal chest CT 07/17/2018   Trigeminal neuralgia of right side of face 02/01/2018   Occipital neuralgia 02/01/2018   Chronic migraine without aura without status migrainosus, not intractable 02/01/2018   Decreased strength 05/10/2017   Family history of breast cancer    Family history of prostate cancer    Chronic left shoulder pain 09/02/2015   Carpal tunnel syndrome 08/25/2011   Asthma 04/14/2009   Essential hypertension 04/14/2009   Hypercholesterolemia 04/14/2009   Past Medical History:  Diagnosis Date   Arthritis 2010   Asthma    Chronic headaches    Chronic pain    Family history of breast cancer    Family history of prostate cancer    Gallstones 2018   GERD (gastroesophageal reflux disease)    High cholesterol    History of kidney stones    Hx: UTI (urinary tract infection)    Hypertension    IBS (irritable bowel syndrome)    Interstitial cystitis 2012   Migraine    Occipital neuralgia    Osteoarthritis    PNA (pneumonia) 2018   Pre-diabetes    Spinal stenosis    Trigeminal neuralgia    Trigeminal neuralgia    Family History  Problem Relation Age of Onset    Ulcers Mother 47       peptic ulcer   Irritable bowel syndrome Mother    Prostate cancer Father 11   Heart disease Father    Irritable bowel syndrome Father    Breast cancer Sister 69       ER+/PR+/Her2- breast cancer   Breast cancer Paternal Grandmother  Diabetes Paternal Grandmother    Cancer Paternal Uncle        unknown cancers   Past Surgical History:  Procedure Laterality Date   ABDOMINAL HYSTERECTOMY     ANKLE SURGERY Left 05/2009   APPENDECTOMY     BACK SURGERY     Benign Tumor Removal Right 01/26/2017   right upper arm by Dr. Pershing Proud at [place]  2014   CHOLECYSTECTOMY N/A 12/24/2020   Procedure: LAPAROSCOPIC CHOLECYSTECTOMY;  Surgeon: Coralie Keens, MD;  Location: [facility];  Service: General;  Laterality: N/A;   CYSTOSCOPY W/ URETERAL STENT PLACEMENT Left 07/25/2019   Procedure: CYSTOSCOPY WITH RETROGRADE PYELOGRAM/URETERAL STENT PLACEMENT;  Surgeon: Franchot Gallo, MD;  Location: [facility];  Service: Urology;  Laterality: Left;   CYSTOSCOPY W/ URETERAL STENT REMOVAL Left 08/15/2019   Procedure: CYSTOSCOPY WITH STENT REMOVAL;  Surgeon: Franchot Gallo, MD;  Location: [facility];  Service: Urology;  Laterality: Left;   CYSTOSCOPY WITH RETROGRADE PYELOGRAM, URETEROSCOPY AND STENT PLACEMENT Left 08/15/2019   Procedure: CYSTOSCOPY WITH RETROGRADE PYELOGRAM, URETEROSCOPY;  Surgeon: Franchot Gallo, MD;  Location: [facility];  Service: Urology;  Laterality: Left;  3 MINS   CYSTOSCOPY WITH RETROGRADE PYELOGRAM, URETEROSCOPY AND STENT PLACEMENT Right 09/12/2019   Procedure: CYSTOSCOPY WITH RIGHT RETROGRADE PYELOGRAM  URETEROSCOPY WITH HOLMIUM LASER STONE EXTRACTION AND STENT PLACEMENT;  Surgeon: Franchot Gallo, MD;  Location: [facility];  Service: Urology;  Laterality: Right;   DILATION AND CURETTAGE OF UTERUS     HOLMIUM LASER APPLICATION Left 03/14/7543   Procedure: HOLMIUM LASER APPLICATION;  Surgeon: Franchot Gallo,  MD;  Location: [facility];  Service: Urology;  Laterality: Left;   JOINT REPLACEMENT     R knee   KIDNEY STONE SURGERY     OVARIAN CYST REMOVAL  05/2010   right knee revision  2016   ROTATOR CUFF REPAIR Right 02/2019   SPINE SURGERY     TUBAL LIGATION     Social History   Occupational History   Occupation: retired  Tobacco Use   Smoking status: Never   Smokeless tobacco: Never  Vaping Use   Vaping Use: Never used  Substance and Sexual Activity   Alcohol use: Yes    Comment: socially   Drug use: No   Sexual activity: Not on file

## 2020-10-14 NOTE — Progress Notes (Signed)
Right sided headaches and spasms. Sometimes has left sided pain over the last year.  Numeric Pain Rating Scale and Functional Assessment Average Pain 10 Pain Right Now 3 My pain is intermittent and aching Pain is worse with: noise and bright light Pain improves with: rest and medication   In the last MONTH (on 0-10 scale) has pain interfered with the following?  1. General activity like being  able to carry out your everyday physical activities such as walking, climbing stairs, carrying groceries, or moving a chair?  Rating(3)  2. Relation with others like being able to carry out your usual social activities and roles such as  activities at home, at work and in your community. Rating(3)  3. Enjoyment of life such that you have  been bothered by emotional problems such as feeling anxious, depressed or irritable?  Rating(4)

## 2020-10-23 ENCOUNTER — Telehealth: Payer: Self-pay | Admitting: Physical Medicine and Rehabilitation

## 2020-10-23 NOTE — Telephone Encounter (Signed)
Patient would like to [facility] her appointment with Dr. Ernestina Patches. Her call back number is [phone]

## 2020-10-26 NOTE — Telephone Encounter (Signed)
Rescheduled

## 2020-10-27 ENCOUNTER — Other Ambulatory Visit: Payer: Self-pay | Admitting: Surgery

## 2020-10-27 DIAGNOSIS — K8020 Calculus of gallbladder without cholecystitis without obstruction: Secondary | ICD-10-CM | POA: Diagnosis not present

## 2020-10-27 IMAGING — US US ABDOMEN COMPLETE
1 series · 13 of 25 positions shown · non-contrast
Comparison: Ultrasound October 11, 2016. CT scan October 04, 2016.

CLINICAL DATA: Right upper quadrant abdominal pain.

EXAM:
ABDOMEN ULTRASOUND COMPLETE

[Series 1: us abdomen complete · 0.15mm/px · 13 of 87 slices shown]
[im 1/87]
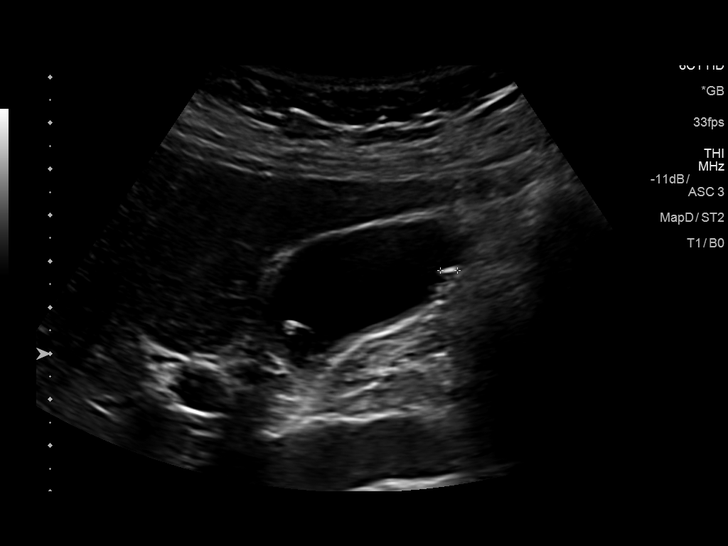
[im 8/87]
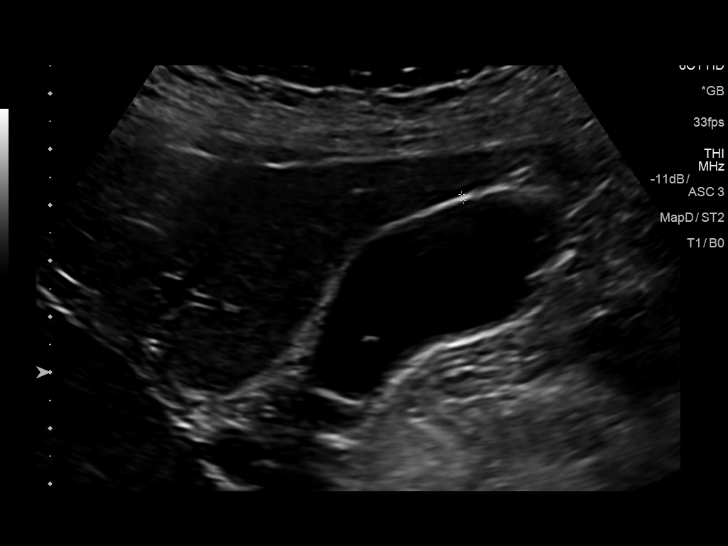
[im 15/87]
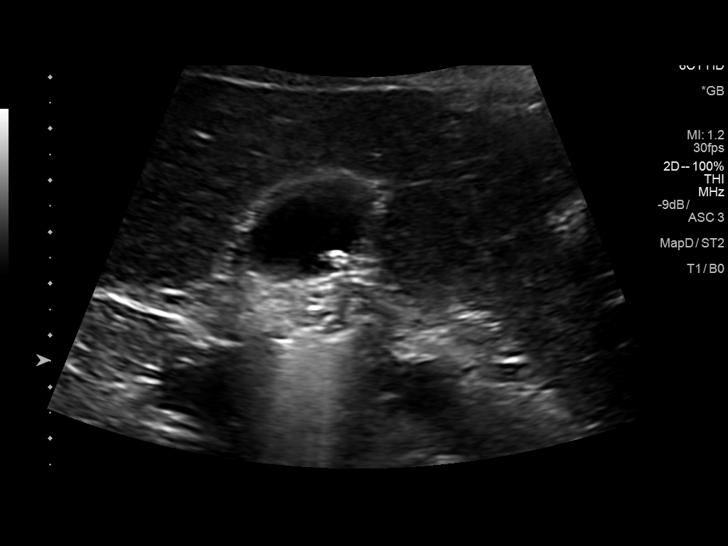
[im 22/87]
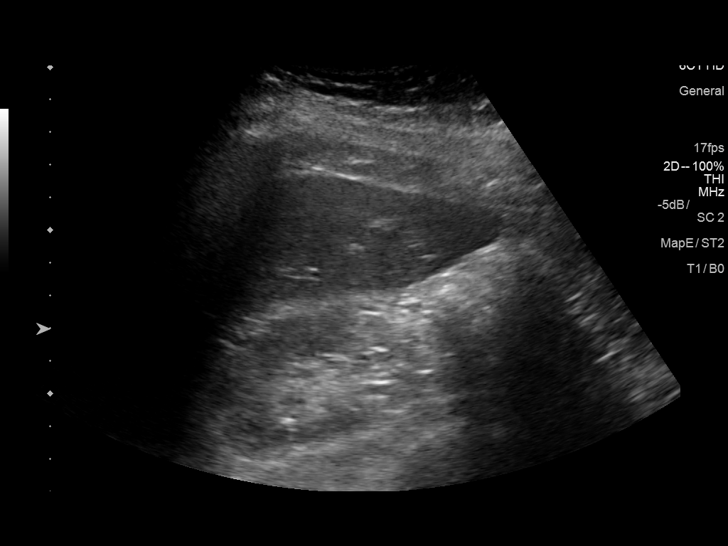
[im 29/87]
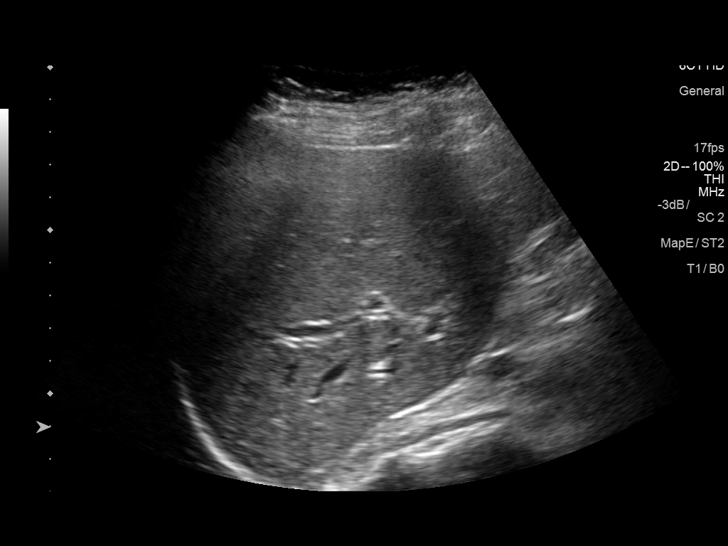
[im 36/87]
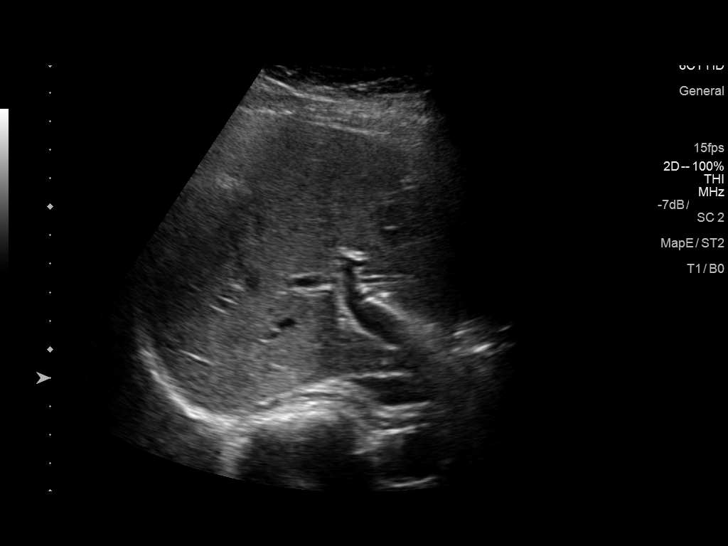
[im 44/87]
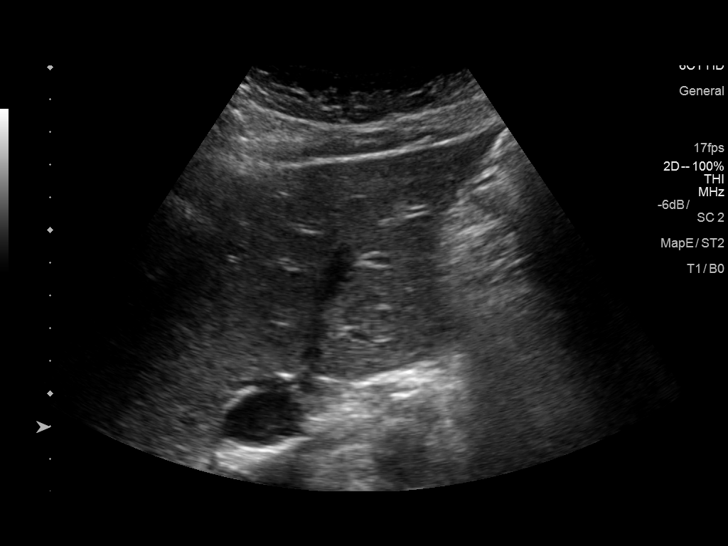
[im 51/87]
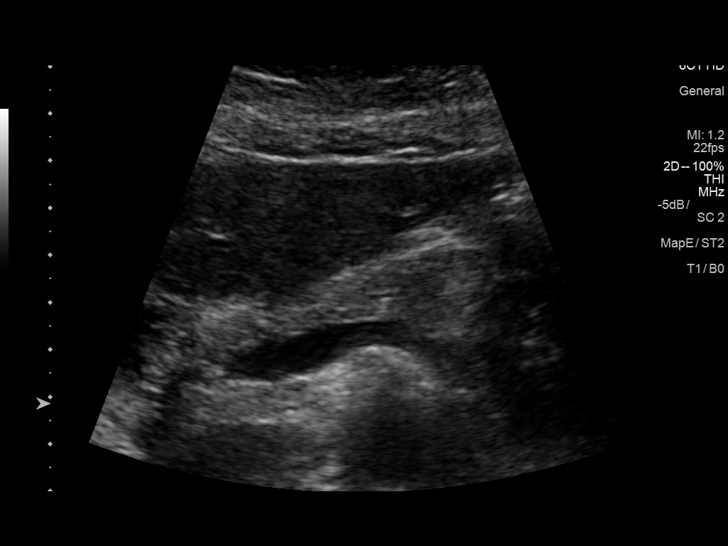
[im 58/87]
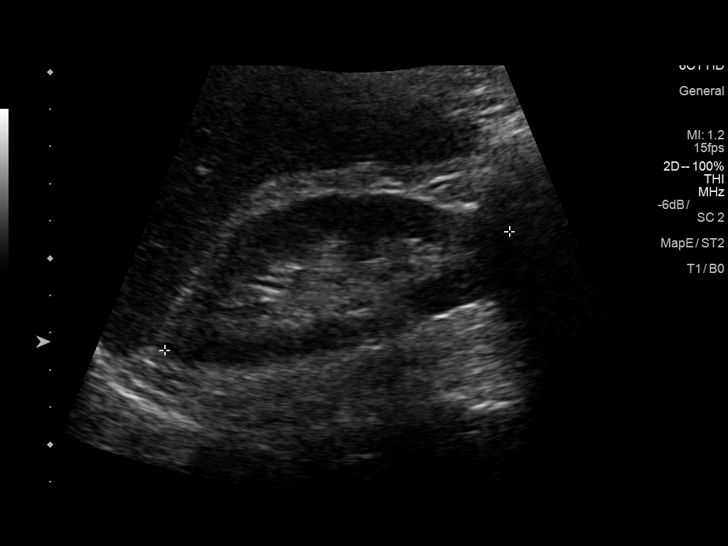
[im 65/87]
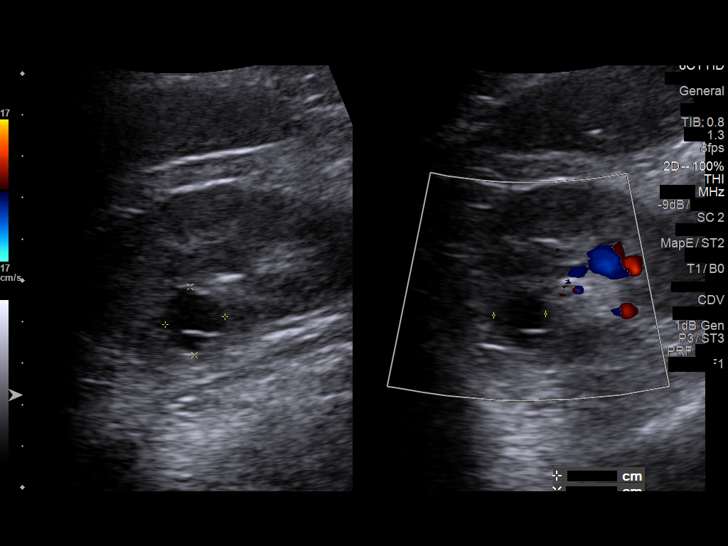
[im 72/87]
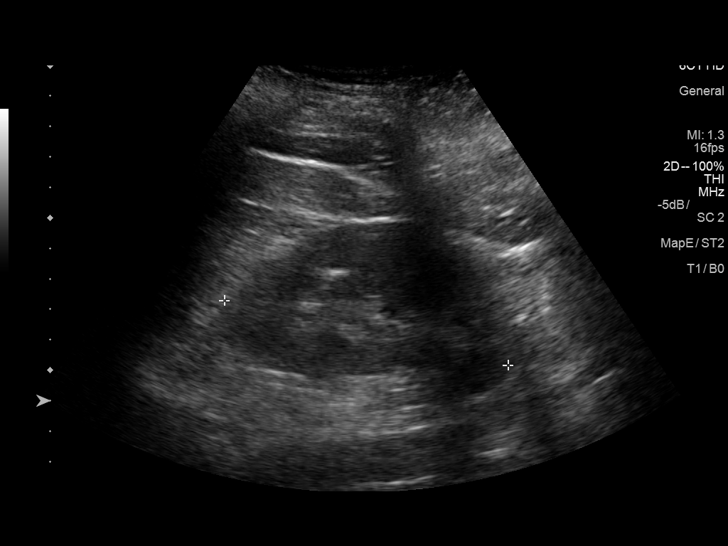
[im 79/87]
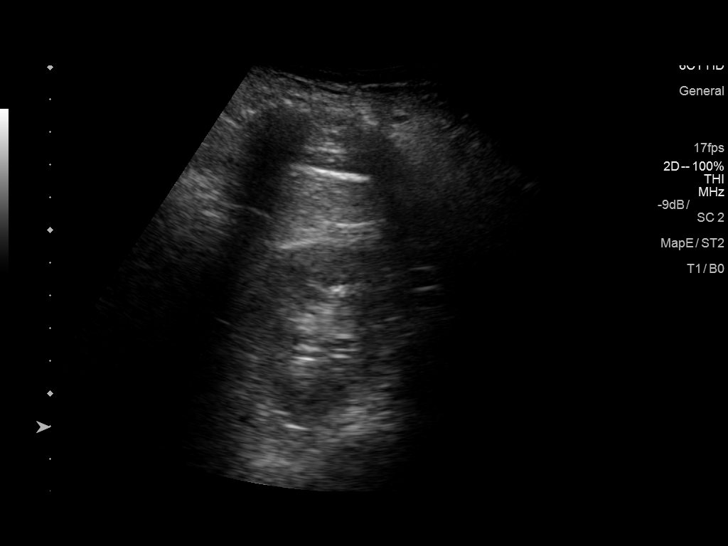
[im 87/87]
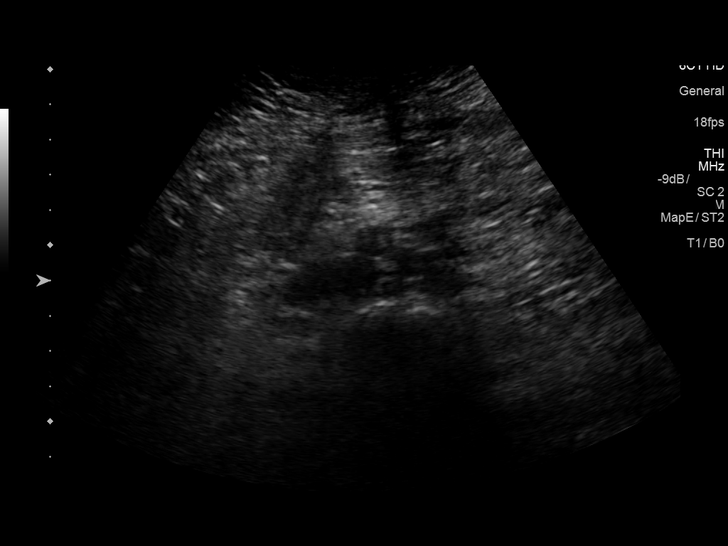

[13 of 25 positions shown; findings below may reference images not displayed]

FINDINGS: Gallbladder: 5 mm calculus is noted without gallbladder wall
thickening or pericholecystic fluid. 4 mm polyp is noted. No
sonographic Murphy's sign is noted.

Common bile duct: Diameter: 3 mm which is within normal limits.

Liver: No focal lesion identified. Within normal limits in
parenchymal echogenicity. Portal vein is patent on color Doppler
imaging with normal direction of blood flow towards the liver.

IVC: No abnormality visualized.

Pancreas: Visualized portion unremarkable.

Spleen: Size and appearance within normal limits.

Right Kidney: Length: 10 cm. 9 mm nonobstructive calculus is noted
in lower pole. 1.5 cm simple cyst is noted. Echogenicity within
normal limits. No mass or hydronephrosis visualized.

Left Kidney: Length: 10 cm. 9 mm nonobstructive calculus is seen in
upper pole. Echogenicity within normal limits. No mass or
hydronephrosis visualized.

Abdominal aorta: No aneurysm visualized.

Other findings: None.
IMPRESSION: Cholelithiasis without inflammation.

4 mm gallbladder polyp is noted.

Bilateral nonobstructive nephrolithiasis.

No other abnormality seen in the abdomen.

## 2020-10-29 ENCOUNTER — Ambulatory Visit: Payer: Medicare Other | Admitting: Physical Medicine and Rehabilitation

## 2020-10-29 DIAGNOSIS — F4325 Adjustment disorder with mixed disturbance of emotions and conduct: Secondary | ICD-10-CM | POA: Diagnosis not present

## 2020-11-02 DIAGNOSIS — I10 Essential (primary) hypertension: Secondary | ICD-10-CM | POA: Diagnosis not present

## 2020-11-02 DIAGNOSIS — N390 Urinary tract infection, site not specified: Secondary | ICD-10-CM | POA: Diagnosis not present

## 2020-11-06 DIAGNOSIS — M792 Neuralgia and neuritis, unspecified: Secondary | ICD-10-CM | POA: Diagnosis not present

## 2020-11-06 DIAGNOSIS — G894 Chronic pain syndrome: Secondary | ICD-10-CM | POA: Diagnosis not present

## 2020-11-06 DIAGNOSIS — M961 Postlaminectomy syndrome, not elsewhere classified: Secondary | ICD-10-CM | POA: Diagnosis not present

## 2020-11-16 ENCOUNTER — Encounter: Payer: Self-pay | Admitting: Physical Medicine and Rehabilitation

## 2020-11-16 ENCOUNTER — Other Ambulatory Visit: Payer: Self-pay

## 2020-11-16 ENCOUNTER — Ambulatory Visit: Payer: Self-pay

## 2020-11-16 ENCOUNTER — Ambulatory Visit (INDEPENDENT_AMBULATORY_CARE_PROVIDER_SITE_OTHER): Payer: Medicare Other | Admitting: Physical Medicine and Rehabilitation

## 2020-11-16 VITALS — BP 159/103 | HR 99

## 2020-11-16 DIAGNOSIS — Z981 Arthrodesis status: Secondary | ICD-10-CM

## 2020-11-16 DIAGNOSIS — M542 Cervicalgia: Secondary | ICD-10-CM | POA: Diagnosis not present

## 2020-11-16 DIAGNOSIS — M5481 Occipital neuralgia: Secondary | ICD-10-CM

## 2020-11-16 DIAGNOSIS — M5459 Other low back pain: Secondary | ICD-10-CM | POA: Diagnosis not present

## 2020-11-16 MED ORDER — BUPIVACAINE HCL 0.25 % IJ SOLN
2.0000 mL | Freq: Once | INTRAMUSCULAR | Status: AC
  Administered 2020-11-16: 2 mL

## 2020-11-16 MED ORDER — BETAMETHASONE SOD PHOS & ACET 6 (3-3) MG/ML IJ SUSP
6.0000 mg | Freq: Once | INTRAMUSCULAR | Status: AC
Start: 2020-11-16 — End: 2020-11-16
  Administered 2020-11-16: 6 mg

## 2020-11-16 NOTE — Patient Instructions (Signed)

## 2020-11-16 NOTE — Progress Notes (Signed)
Pt state head and neck pain. Pt state loud noise makes the pain worse. Pt state she take pain meds to help ease her pain.  Numeric Pain Rating Scale and Functional Assessment Average Pain 5   In the last MONTH (on 0-10 scale) has pain interfered with the following?  1. General activity like being  able to carry out your everyday physical activities such as walking, climbing stairs, carrying groceries, or moving a chair?  Rating(5)   -Driver, -BT, -Dye Allergies.

## 2020-11-16 NOTE — Progress Notes (Signed)
Cassidy Olsen - 60 y.o. female MRN [id number]  Date of birth: 1961-07-27  Office Visit Note: Visit Date: 11/16/2020 PCP: Forrest Moron, MD Referred by: Forrest Moron, MD  Subjective: Chief Complaint  Patient presents with  . Head - Pain  . Neck - Pain   HPI:  Cassidy Olsen is a 60 y.o. female who comes in today For planned diagnostic right C2-3 medial branch block.  Please see our prior notes for further details and justification.  Patient has chronic history of neck pain and cervicalgia with referral to the occipital head and occipital headache complicated by trigeminal neuralgia.  Had more of a recent fall with pain in the sacroiliac joint for which she is seeing Dr. Melina Schools at [facility].  Her case is also complicated by multiple drug intolerances and myofascial pain.  Depending on relief and pain diary review consider second diagnostic block versus return to see Dr. Jaynee Eagles for headache management.  She has had a prior cervical fusion of the mid cervical spine.  ROS Otherwise per HPI.  Assessment & Plan: Visit Diagnoses:    ICD-10-CM   1. Cervicalgia  M54.2 XR C-ARM NO REPORT    Facet Injection    bupivacaine (MARCAINE) 0.25 % (with pres) injection 2 mL    betamethasone acetate-betamethasone sodium phosphate (CELESTONE) injection 6 mg  2. Occipital neuralgia of right side  M54.81 XR C-ARM NO REPORT    Facet Injection    bupivacaine (MARCAINE) 0.25 % (with pres) injection 2 mL    betamethasone acetate-betamethasone sodium phosphate (CELESTONE) injection 6 mg  3. S/P cervical spinal fusion  Z98.1 XR C-ARM NO REPORT    Facet Injection    bupivacaine (MARCAINE) 0.25 % (with pres) injection 2 mL    betamethasone acetate-betamethasone sodium phosphate (CELESTONE) injection 6 mg    Plan: No additional findings.   Meds & Orders:  Meds ordered this encounter  Medications  . bupivacaine (MARCAINE) 0.25 % (with pres) injection 2 mL  . betamethasone  acetate-betamethasone sodium phosphate (CELESTONE) injection 6 mg    Orders Placed This Encounter  Procedures  . Facet Injection  . XR C-ARM NO REPORT    Follow-up: Return for Review Pain Diary.   Procedures: No procedures performed  Diagnostic Cervical Facet Joint Nerve Block with Fluoroscopic Guidance  Patient: Cassidy Olsen      Date of Birth: [birth year] MRN: [id number] PCP: Forrest Moron, MD      Visit Date: 11/16/2020   Universal Protocol:    Date/Time: 04/11/222:05 PM  Consent Given By: the patient  Position: LATERAL  Additional Comments: Vital signs were monitored before and after the procedure. Patient was prepped and draped in the usual sterile fashion. The correct patient, procedure, and site was verified.   Injection Procedure Details:   Procedure diagnoses: Cervicalgia [M54.2]   Meds Administered:  Meds ordered this encounter  Medications  . bupivacaine (MARCAINE) 0.25 % (with pres) injection 2 mL  . betamethasone acetate-betamethasone sodium phosphate (CELESTONE) injection 6 mg     Laterality: Right  Location/Site:  C2-3  Needle size: 25 G  Needle type: spinal needle  Needle Placement: Articular Pillar  Findings:  -Contrast Used: 0.5 mL iohexol 180 mg iodine/mL   -Comments: Excellent flow of contrast across the articular pillars without intravascular flow  Procedure Details: The fluoroscope beam was manipulated to achieve the best "true" lateral view possible by squaring off the endplates with cranial and caudal tilt and using varying obliquity to achieve the a  view with the longest length of spinous process.  The region overlying the facet joints mentioned above were then localized under fluoroscopic visualization.  The needle was inserted down to the center of the "trapezoid" outline of the facet joint lateral mass. Bi-planar images were used for confirming placement and spot radiographs were documented.  A 0.25 ml volume of Omnipaque-240  was injected to look for vascular uptake. A 0.5 ml. volume of the anesthetic solution was injected onto the target. This procedure was repeated for each medial branch nerve injected.  Prior to the procedure, the patient was given a Pain Diary which was completed for baseline measurements.  After the procedure, the patient rated their pain every 30 minutes and will continue rating at this frequency for a total of 5 hours.  The patient has been asked to complete the Diary and return to [place] by mail, fax or hand delivered as soon as possible.   Additional Comments:  The patient tolerated the procedure well Dressing: Band-Aid    Post-procedure details: Patient was observed during the procedure. Post-procedure instructions were reviewed.  Patient left the clinic in stable condition.       Clinical History: CT CERVICAL SPINE WITHOUT CONTRAST    TECHNIQUE:  Multidetector CT imaging of the cervical spine was performed without  intravenous contrast. Multiplanar CT image reconstructions were also  generated.    COMPARISON: 10/30/2019    FINDINGS:  Alignment: Alignment is anatomic.    Skull base and vertebrae: No acute displaced fractures. Postsurgical  changes are seen from prior right occipital  craniotomy/mastoidectomy.    Soft tissues and spinal canal: No prevertebral fluid or swelling. No  visible canal hematoma.    Disc levels: Stable C6/C7 ACDF. Prominent anterior osteophyte at  C5/C6 unchanged. Mild facet hypertrophy most pronounced at C2-3. No  significant compressive sequela.    Upper chest: Airway is patent. Lung apices are clear.    Other: Reconstructed images demonstrate no additional findings.    IMPRESSION:  1. No acute cervical spine fracture.  2. Stable spondylosis and facet hypertrophy.      Electronically Signed  By: Randa Ngo M.D.  On: 04/17/2020 16:58 -- MRI CERVICAL SPINE WITHOUT CONTRAST  TECHNIQUE: Multiplanar,  multisequence MR imaging of the cervical spine was performed. No intravenous contrast was administered.  COMPARISON:  September 23, 2016  FINDINGS: Alignment: There is straightening of the normal cervical lordosis.  Vertebrae: The patient is status post ACDF at C6-C7 with surrounding metallic artifact. The vertebral body heights are well maintained. No fracture, marrow edema,or pathologic marrow infiltration.  Cord: Normal signal and morphology.  Posterior Fossa, vertebral arteries, paraspinal tissues:  The visualized portion of the posterior fossa is unremarkable. Normal flow voids seen within the vertebral arteries. The paraspinal soft tissues are unremarkable.  Disc levels:  C1-C2: Atlanto-axial junction is normal, without canal narrowing  C2-C3: There is a minimal disc osteophyte complex which is asymmetric to the left with uncovertebral osteophytes which causes left neural foraminal narrowing.  C3-C4: There is a disc osteophyte complex and uncovertebral osteophytes with a left paracentral disc protrusion which contacts and effaces the anterior thecal sac. There is moderate right and mild left neural foraminal narrowing.  C4-C5: There is a disc osteophyte complex and uncovertebral osteophytes with a tiny left paracentral disc protrusion. There is moderate bilateral neural foraminal narrowing. Mild central canal stenosis is seen.  C5-C6: Disc osteophyte complex and uncovertebral osteophytes are seen which causes mild bilateral neural foraminal narrowing.  C6-C7: No significant  spinal canal or neural foraminal narrowing  C7-T1: No significant spinal canal or neural foraminal narrowing  IMPRESSION: Status post ACDF at C6-C7 with surrounding metallic artifact.  Cervical spine spondylosis most notable at C3-C4 and C4-C5 with a small left paracentral disc protrusion and moderate neural foraminal narrowing with mild central canal stenosis as described  above. This is slightly progressed since the prior exam.   Electronically Signed   By: Prudencio Pair M.D.   On: 09/18/2019 21:00     Objective:  VS:  HT:    WT:   BMI:     BP:(!) 159/103  HR:99bpm  TEMP: ( )  RESP:  Physical Exam Vitals and nursing note reviewed.  Constitutional:      General: She is not in acute distress.    Appearance: Normal appearance. She is not ill-appearing.  HENT:     Head: Normocephalic and atraumatic.     Right Ear: External ear normal.     Left Ear: External ear normal.  Eyes:     Extraocular Movements: Extraocular movements intact.  Cardiovascular:     Rate and Rhythm: Normal rate.     Pulses: Normal pulses.  Musculoskeletal:     Cervical back: Tenderness present. No rigidity.     Right lower leg: No edema.     Left lower leg: No edema.     Comments: Patient has good strength in the upper extremities including 5 out of 5 strength in wrist extension long finger flexion and APB.  There is no atrophy of the hands intrinsically.  There is a negative Hoffmann's test.   Lymphadenopathy:     Cervical: No cervical adenopathy.  Skin:    Findings: No erythema, lesion or rash.  Neurological:     General: No focal deficit present.     Mental Status: She is alert and oriented to person, place, and time.     Sensory: No sensory deficit.     Motor: No weakness or abnormal muscle tone.     Coordination: Coordination normal.  Psychiatric:        Mood and Affect: Mood normal.        Behavior: Behavior normal.      Imaging: XR C-ARM NO REPORT  Result Date: 11/16/2020 Please see Notes tab for imaging impression.

## 2020-11-16 NOTE — Procedures (Signed)
Diagnostic Cervical Facet Joint Nerve Block with Fluoroscopic Guidance  Patient: Cassidy Olsen      Date of Birth: 1960-12-15 MRN: [id number] PCP: Forrest Moron, MD      Visit Date: 11/16/2020   Universal Protocol:    Date/Time: 04/11/222:05 PM  Consent Given By: the patient  Position: LATERAL  Additional Comments: Vital signs were monitored before and after the procedure. Patient was prepped and draped in the usual sterile fashion. The correct patient, procedure, and site was verified.   Injection Procedure Details:   Procedure diagnoses: Cervicalgia [M54.2]   Meds Administered:  Meds ordered this encounter  Medications  . bupivacaine (MARCAINE) 0.25 % (with pres) injection 2 mL  . betamethasone acetate-betamethasone sodium phosphate (CELESTONE) injection 6 mg     Laterality: Right  Location/Site:  C2-3  Needle size: 25 G  Needle type: spinal needle  Needle Placement: Articular Pillar  Findings:  -Contrast Used: 0.5 mL iohexol 180 mg iodine/mL   -Comments: Excellent flow of contrast across the articular pillars without intravascular flow  Procedure Details: The fluoroscope beam was manipulated to achieve the best "true" lateral view possible by squaring off the endplates with cranial and caudal tilt and using varying obliquity to achieve the a view with the longest length of spinous process.  The region overlying the facet joints mentioned above were then localized under fluoroscopic visualization.  The needle was inserted down to the center of the "trapezoid" outline of the facet joint lateral mass. Bi-planar images were used for confirming placement and spot radiographs were documented.  A 0.25 ml volume of Omnipaque-240 was injected to look for vascular uptake. A 0.5 ml. volume of the anesthetic solution was injected onto the target. This procedure was repeated for each medial branch nerve injected.  Prior to the procedure, the patient was given a Pain Diary  which was completed for baseline measurements.  After the procedure, the patient rated their pain every 30 minutes and will continue rating at this frequency for a total of 5 hours.  The patient has been asked to complete the Diary and return to [place] by mail, fax or hand delivered as soon as possible.   Additional Comments:  The patient tolerated the procedure well Dressing: Band-Aid    Post-procedure details: Patient was observed during the procedure. Post-procedure instructions were reviewed.  Patient left the clinic in stable condition.

## 2020-11-18 ENCOUNTER — Telehealth: Payer: Self-pay | Admitting: *Deleted

## 2020-11-18 NOTE — Telephone Encounter (Signed)
Spoke with Dr Jaynee Eagles who advised this is primary stabbing headache, commonly seen in migraineurs. Recommend 10-14 days of Indomethacin 75 mg BID. Take with food and watch for stomach upset.

## 2020-11-18 NOTE — Telephone Encounter (Signed)
Spoke with patient who stated the pain is not sharp or stabbing that is an electric shock that feels just the way of the trigeminal neuralgia pain on the right-hand side felt.  She said it may last 15 seconds and stop and start again over the course of 2 minutes.  She is concerned about taking medication if not needed. I spoke with Dr. Jaynee Eagles who advised we instead would need an ov to discuss this. Pt aware and understood and was scheduled for next available appt on 01/25/21 at 1:00 pm arrival 1245. Pt verbalized appreciation for the call.

## 2020-11-18 NOTE — Telephone Encounter (Signed)
Message from [place] on 11/16/20 :  Patient called today stating she is having intense sharp pains on the left side of her head. She has been experiencing this for the last 3 weeks. She is wondering if a nerve block would be possible.    I returned the patient's call today.  She is doing better. On 11/16/2020 she had right side facet injection by Dr. Ernestina Patches and she said that did help.  However for the last 2 weeks about 5-6 times she has noticed intermittent shocking pain on the left side of her head that lasts 2 minutes at a time. This is not new however as it has happened in the past. She stated she was having flashbacks of that experience in the past and is wondering what Dr. Jaynee Eagles recommends. She has had nerve blocks before. I let her know Dr Jaynee Eagles would be consulted and we would call her back. She verbalized appreciation.

## 2020-11-30 ENCOUNTER — Other Ambulatory Visit (HOSPITAL_COMMUNITY): Payer: Medicare Other

## 2020-12-03 ENCOUNTER — Ambulatory Visit (INDEPENDENT_AMBULATORY_CARE_PROVIDER_SITE_OTHER): Payer: Medicare Other | Admitting: Family Medicine

## 2020-12-03 ENCOUNTER — Ambulatory Visit: Admit: 2020-12-03 | Payer: Medicare Other | Admitting: Surgery

## 2020-12-03 ENCOUNTER — Ambulatory Visit: Payer: Medicare Other | Admitting: Family Medicine

## 2020-12-03 DIAGNOSIS — G43709 Chronic migraine without aura, not intractable, without status migrainosus: Secondary | ICD-10-CM

## 2020-12-03 DIAGNOSIS — F4325 Adjustment disorder with mixed disturbance of emotions and conduct: Secondary | ICD-10-CM | POA: Diagnosis not present

## 2020-12-03 SURGERY — LAPAROSCOPIC CHOLECYSTECTOMY
Anesthesia: General

## 2020-12-03 NOTE — Progress Notes (Signed)
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

## 2020-12-03 NOTE — Progress Notes (Signed)
12/03/2020 ALL: She continues Emgality, amitriptyline and gabapentin. Nurtec does not help much, Excedrin works best. She continues to work with Dr Ernestina Patches for neuralgia. She is followed by [facility] and anticipates nerve stimulator placement in June.    09/03/2020 ALL: She continues Emgality, amitriptyline 50mg  and gabapentin 600mg  TID. Excedrin works for abortive therapy. Nurtec helps to "take the edge off". She reports doing very well, today. Headaches have been well managed with the exception of this past week. She fell last week. She also lost her step mother. She feels that stress from these events have contributed.  She was re referred to Dr Ernestina Patches for occipital neuralgia with inconsistent relief following multiple nerve blocks/Botox treatments. She reports that she was discharged from Dr Vira Blanco, general pain management, due to too many cancellations. She is seeing [place] for knee and shoulder pain but they have told her they do not write pain medications. Last office visit with [place] 01/2020.    05/20/2020 ALL: Last Botox was 02/19/2020. Dr Jaynee Eagles started Emgality at last follow up in 02/2020. 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 She was seen but reports that Dr Ernestina Patches wished to discuss with her current pain management provider (Dr Vira Blanco). She has an appt with pain management this month. She continues to have regular migraines, at lest 2-3 per week. She is using Excedrin for abortive therapy. She had a prescription for Nurtec but wasn't sure it helped. She is asking to try it again.    Consent Form Botulism Toxin Injection For Chronic Migraine    Reviewed orally with patient, additionally signature is on file:  Botulism toxin has been approved by the Federal drug administration for treatment of chronic migraine. Botulism toxin does not cure chronic migraine and it may not be effective in some patients.  The administration of  botulism toxin is accomplished by injecting a small amount of toxin into the muscles of the neck and head. Dosage must be titrated for each individual. Any benefits resulting from botulism toxin tend to wear off after 3 months with a repeat injection required if benefit is to be maintained. Injections are usually done every 3-4 months with maximum effect peak achieved by about 2 or 3 weeks. Botulism toxin is expensive and you should be sure of what costs you will incur resulting from the injection.  The side effects of botulism toxin use for chronic migraine may include:   -Transient, and usually mild, facial weakness with facial injections  -Transient, and usually mild, head or neck weakness with head/neck injections  -Reduction or loss of forehead facial animation due to forehead muscle weakness  -Eyelid drooping  -Dry eye  -Pain at the site of injection or bruising at the site of injection  -Double vision  -Potential unknown long term risks   Contraindications: You should not have Botox if you are pregnant, nursing, allergic to albumin, have an infection, skin condition, or muscle weakness at the site of the injection, or have myasthenia gravis, Lambert-Eaton syndrome, or ALS.  It is also possible that as with any injection, there may be an allergic reaction or no effect from the medication. Reduced effectiveness after repeated injections is sometimes seen and rarely infection at the injection site may occur. All care will be taken to prevent these side effects. If therapy is given over a long time, atrophy and wasting in the muscle injected may occur. Occasionally the patient's become refractory to treatment because they develop antibodies  to the toxin. In this event, therapy needs to be modified.  I have read the above information and consent to the administration of botulism toxin.    BOTOX PROCEDURE NOTE FOR MIGRAINE HEADACHE  Contraindications and precautions discussed with  patient(above). Aseptic procedure was observed and patient tolerated procedure. Procedure performed by Debbora Presto, FNP-C.   The condition has existed for more than 6 months, and pt does not have a diagnosis of ALS, Myasthenia Gravis or Lambert-Eaton Syndrome.  Risks and benefits of injections discussed and pt agrees to proceed with the procedure.  Written consent obtained  These injections are medically necessary. Pt  receives good benefits from these injections. These injections do not cause sedations or hallucinations which the oral therapies may cause.   Description of procedure:  The patient was placed in a sitting position. The standard protocol was used for Botox as follows, with 5 units of Botox injected at each site:  -Procerus muscle, midline injection  -Corrugator muscle, bilateral injection  -Frontalis muscle, bilateral injection, with 2 sites each side, medial injection was performed in the upper one third of the frontalis muscle, in the region vertical from the medial inferior edge of the superior orbital rim. The lateral injection was again in the upper one third of the forehead vertically above the lateral limbus of the cornea, 1.5 cm lateral to the medial injection site.  -Temporalis muscle injection, 4 sites, bilaterally. The first injection was 3 cm above the tragus of the ear, second injection site was 1.5 cm to 3 cm up from the first injection site in line with the tragus of the ear. The third injection site was 1.5-3 cm forward between the first 2 injection sites. The fourth injection site was 1.5 cm posterior to the second injection site. 5th site laterally in the temporalis  muscleat the level of the outer canthus.  -Occipitalis muscle injection, 3 sites, bilaterally. The first injection was done one half way between the occipital protuberance and the tip of the mastoid process behind the ear. The second injection site was done lateral and superior to the first, 1  fingerbreadth from the first injection. The third injection site was 1 fingerbreadth superiorly and medially from the first injection site.  -Cervical paraspinal muscle injection, 2 sites, bilaterally. The first injection site was 1 cm from the midline of the cervical spine, 3 cm inferior to the lower border of the occipital protuberance. The second injection site was 1.5 cm superiorly and laterally to the first injection site.  -Trapezius muscle injection was performed at 3 sites, bilaterally. The first injection site was in the upper trapezius muscle halfway between the inflection point of the neck, and the acromion. The second injection site was one half way between the acromion and the first injection site. The third injection was done between the first injection site and the inflection point of the neck.  -LS bilaterally, 5 unites each    Will return for repeat injection in 3 months.   A total of 200 units of Botox was prepared, 165 units of Botox was injected as documented above, any Botox not injected was wasted. The patient tolerated the procedure well, there were no complications of the above procedure.

## 2020-12-10 ENCOUNTER — Ambulatory Visit: Payer: Medicare Other | Admitting: Family Medicine

## 2020-12-12 ENCOUNTER — Other Ambulatory Visit: Payer: Self-pay | Admitting: Gastroenterology

## 2020-12-12 DIAGNOSIS — K5904 Chronic idiopathic constipation: Secondary | ICD-10-CM

## 2020-12-18 NOTE — Progress Notes (Signed)
Surgical Instructions    Your procedure is scheduled on Thursday, May 19th.  Report to [facility] Main Entrance "A" at 7:00 A.M., then check in with the Admitting office.  Call this number if you have problems the morning of surgery:  [phone]   If you have any questions prior to your surgery date call [phone]: Open Monday-Friday 8am-4pm    Remember:  Do not eat after midnight the night before your surgery  You may drink clear liquids until 6;00 AM the morning of your surgery.   Clear liquids allowed are: Water, Non-Citrus Juices (without pulp), Carbonated Beverages, Clear Tea, Black Coffee Only, and Gatorade. Patient Instructions  . The night before surgery:  o No food after midnight. ONLY clear liquids after midnight  . The day of surgery (if you do NOT have diabetes):  o Drink ONE (1) Pre-Surgery Clear Ensure by 6:00 AM the morning of surgery. Drink in one sitting. Do not sip.  o This drink was given to you during your hospital  pre-op appointment visit.  o Nothing else to drink after completing the  Pre-Surgery Clear Ensure.          If you have questions, please contact your surgeon's office.     Take these medicines the morning of surgery with A SIP OF WATER    dexlansoprazole (DEXILANT)   fenofibrate (TRICOR)  gabapentin (NEURONTIN)  As Needed:   acetaminophen (TYLENOL)   traMADol (ULTRAM)  budesonide-formoterol (SYMBICORT)   albuterol (VENTOLIN HFA) inhaler.  Please bring your inhalers the day of surgery.    As of today, STOP taking any Aspirin (unless otherwise instructed by your surgeon) Aleve, Naproxen, Ibuprofen, Motrin, Advil, Goody's, BC's, all herbal medications, fish oil, and all vitamins, including  celecoxib (CELEBREX), magnesium oxide (MAG-OX), Probiotic Product (PROBIOTIC DAILY PO),                      Do not wear jewelry, make up, or nail polish            Do not wear lotions, powders, perfumes, or deodorant.            Do not shave  48 hours prior to surgery.              Do not bring valuables to the hospital.            [facility] is not responsible for any belongings or valuables.  Do NOT Smoke (Tobacco/Vaping) or drink Alcohol 24 hours prior to your procedure If you use a CPAP at night, you may bring all equipment for your overnight stay.   Contacts, glasses, dentures or bridgework may not be worn into surgery, please bring cases for these belongings   For patients admitted to the hospital, discharge time will be determined by your treatment team.   Patients discharged the day of surgery will not be allowed to drive home, and someone needs to stay with them for 24 hours.    Special instructions:   [place]- Preparing For Surgery  Before surgery, you can play an important role. Because skin is not sterile, your skin needs to be as free of germs as possible. You can reduce the number of germs on your skin by washing with CHG (chlorahexidine gluconate) Soap before surgery.  CHG is an antiseptic cleaner which kills germs and bonds with the skin to continue killing germs even after washing.    Oral Hygiene is also important to reduce your risk  of infection.  Remember - BRUSH YOUR TEETH THE MORNING OF SURGERY WITH YOUR REGULAR TOOTHPASTE  Please do not use if you have an allergy to CHG or antibacterial soaps. If your skin becomes reddened/irritated stop using the CHG.  Do not shave (including legs and underarms) for at least 48 hours prior to first CHG shower. It is OK to shave your face.  Please follow these instructions carefully.   1. Shower the NIGHT BEFORE SURGERY and the MORNING OF SURGERY  2. If you chose to wash your hair, wash your hair first as usual with your normal shampoo.  3. After you shampoo, rinse your hair and body thoroughly to remove the shampoo.  4. Wash Face and genitals (private parts) with your normal soap.   5.  Shower the NIGHT BEFORE SURGERY and the MORNING OF SURGERY with CHG  Soap.   6. Use CHG Soap as you would any other liquid soap. You can apply CHG directly to the skin and wash gently with a scrungie or a clean washcloth.   7. Apply the CHG Soap to your body ONLY FROM THE NECK DOWN.  Do not use on open wounds or open sores. Avoid contact with your eyes, ears, mouth and genitals (private parts). Wash Face and genitals (private parts)  with your normal soap.   8. Wash thoroughly, paying special attention to the area where your surgery will be performed.  9. Thoroughly rinse your body with warm water from the neck down.  10. DO NOT shower/wash with your normal soap after using and rinsing off the CHG Soap.  11. Pat yourself dry with a CLEAN TOWEL.  12. Wear CLEAN PAJAMAS to bed the night before surgery  13. Place CLEAN SHEETS on your bed the night before your surgery  14. DO NOT SLEEP WITH PETS.   Day of Surgery: Take a shower with CHG soap. Wear Clean/Comfortable clothing the morning of surgery Do not apply any deodorants/lotions.   Remember to brush your teeth WITH YOUR REGULAR TOOTHPASTE.   Please read over the following fact sheets that you were given.

## 2020-12-21 ENCOUNTER — Other Ambulatory Visit (HOSPITAL_COMMUNITY)
Admission: RE | Admit: 2020-12-21 | Discharge: 2020-12-21 | Disposition: A | Discharge status: Home / Self Care | Payer: Medicare Other | Source: Ambulatory Visit | Attending: Surgery | Admitting: Surgery

## 2020-12-21 ENCOUNTER — Other Ambulatory Visit: Payer: Self-pay

## 2020-12-21 ENCOUNTER — Encounter (HOSPITAL_COMMUNITY)
Admission: RE | Admit: 2020-12-21 | Discharge: 2020-12-21 | Disposition: A | Discharge status: Home / Self Care | Payer: Medicare Other | Source: Ambulatory Visit | Attending: Surgery | Admitting: Surgery

## 2020-12-21 ENCOUNTER — Encounter (HOSPITAL_COMMUNITY): Payer: Self-pay

## 2020-12-21 DIAGNOSIS — Z20822 Contact with and (suspected) exposure to covid-19: Secondary | ICD-10-CM | POA: Insufficient documentation

## 2020-12-21 DIAGNOSIS — Z01818 Encounter for other preprocedural examination: Secondary | ICD-10-CM | POA: Diagnosis not present

## 2020-12-21 DIAGNOSIS — Z01812 Encounter for preprocedural laboratory examination: Secondary | ICD-10-CM | POA: Insufficient documentation

## 2020-12-21 HISTORY — DX: Personal history of urinary calculi: Z87.442

## 2020-12-21 HISTORY — DX: Prediabetes: R73.03

## 2020-12-21 LAB — BASIC METABOLIC PANEL
Anion gap: 11 (ref 5–15)
BUN: 13 mg/dL (ref 6–20)
CO2: 25 mmol/L (ref 22–32)
Calcium: 9.1 mg/dL (ref 8.9–10.3)
Chloride: 103 mmol/L (ref 98–111)
Creatinine, Ser: 1 mg/dL (ref 0.44–1.00)
GFR, Estimated: >60 mL/min (ref >60)
Glucose, Bld: 83 mg/dL (ref 70–99)
Potassium: 3.4 mmol/L — ABNORMAL LOW (ref 3.5–5.1)
Sodium: 139 mmol/L (ref 135–145)

## 2020-12-21 LAB — CBC
HCT: 39 % (ref 36.0–46.0)
Hemoglobin: 13 g/dL (ref 12.0–15.0)
MCH: 31.3 pg (ref 26.0–34.0)
MCHC: 33.3 g/dL (ref 30.0–36.0)
MCV: 93.8 fL (ref 80.0–100.0)
Platelets: 394 10*3/uL (ref 150–400)
RBC: 4.16 MIL/uL (ref 3.87–5.11)
RDW: 12.8 % (ref 11.5–15.5)
WBC: 9 10*3/uL (ref 4.0–10.5)
nRBC: 0 % (ref 0.0–0.2)

## 2020-12-21 LAB — HEMOGLOBIN A1C
Hgb A1c MFr Bld: 6 % — ABNORMAL HIGH (ref 4.8–5.6)
Mean Plasma Glucose: 125.5 mg/dL

## 2020-12-21 NOTE — Progress Notes (Signed)
PCP - Zoe A. Nolon Rod, MD Cardiologist - Denies  PPM/ICD - Denies  Chest x-ray - N/A EKG - 12/21/20 Stress Test - Denies ECHO - Denies Cardiac Cath - Denies  Sleep Study - Yes, negative for OSA  Patient is pre-diabetic, does not check CBGs at home. A1C obtained  Blood Thinner Instructions: N/A Aspirin Instructions: N/A  ERAS Protcol - Yes PRE-SURGERY Ensure or G2- Ensure given  COVID TEST- 12/21/20   Anesthesia review: No  Patient denies shortness of breath, fever, cough and chest pain at PAT appointment   All instructions explained to the patient, with a verbal understanding of the material. Patient agrees to go over the instructions while at home for a better understanding. Patient also instructed to self quarantine after being tested for COVID-19. The opportunity to ask questions was provided.

## 2020-12-21 NOTE — Pre-Procedure Instructions (Signed)
Surgical Instructions:    Your procedure is scheduled on Thursday, May 19th, from 09:00 AM- 10:00 AM.  Report to [facility] Main Entrance "A" at 07:00 A.M., then check in with the Admitting office.  Call this number if you have any questions prior to, or have any problems the morning of surgery:  [phone]    Remember:  Do not eat after midnight the night before your surgery.  You may drink clear liquids until 06:00 AM the morning of your surgery.   Clear liquids allowed are: Water, Non-Citrus Juices (without pulp), Carbonated Beverages, Clear Tea, Black Coffee Only, and Gatorade.   . The day of surgery (if you do NOT have diabetes):  o Drink ONE (1) Pre-Surgery Clear Ensure by 06:00 AM the morning of surgery.   o This drink was given to you during your hospital pre-op appointment visit. o Nothing else to drink after completing the Pre-Surgery Clear Ensure.          If you have questions, please contact your surgeon's office.     Take these medicines the morning of surgery with A SIP OF WATER: dexlansoprazole (DEXILANT)  fenofibrate (TRICOR) abapentin (NEURONTIN) fluticasone (FLONASE) nasal spray  IF NEEDED: acetaminophen (TYLENOL)  dicyclomine (BENTYL)  traMADol (ULTRAM)  budesonide-formoterol (SYMBICORT) inhaler albuterol (VENTOLIN HFA) inhaler  *Please bring all inhalers with you the day of surgery.     As of today, STOP taking any Aspirin (unless otherwise instructed by your surgeon), celecoxib (CELEBREX), Aleve, Naproxen, Ibuprofen, Motrin, Advil, Goody's, BC's, all herbal medications, fish oil, and all vitamins.             Special instructions:   [place]- Preparing For Surgery  Before surgery, you can play an important role. Because skin is not sterile, your skin needs to be as free of germs as possible. You can reduce the number of germs on your skin by washing with CHG (chlorahexidine gluconate) Soap before surgery.  CHG is an antiseptic cleaner which  kills germs and bonds with the skin to continue killing germs even after washing.    Oral Hygiene is also important to reduce your risk of infection.  Remember - BRUSH YOUR TEETH THE MORNING OF SURGERY WITH YOUR REGULAR TOOTHPASTE  Please do not use if you have an allergy to CHG or antibacterial soaps. If your skin becomes reddened/irritated stop using the CHG.  Do not shave (including legs and underarms) for at least 48 hours prior to first CHG shower. It is OK to shave your face.  Please follow these instructions carefully.   1. Shower the NIGHT BEFORE SURGERY and the MORNING OF SURGERY  2. If you chose to wash your hair, wash your hair first as usual with your normal shampoo.  3. After you shampoo, rinse your hair and body thoroughly to remove the shampoo.  4. Wash Face and genitals (private parts) with your normal soap.   5. Use CHG Soap as you would any other liquid soap. You can apply CHG directly to the skin and wash gently with a scrungie or a clean washcloth.   6. Apply the CHG Soap to your body ONLY FROM THE NECK DOWN.  Do not use on open wounds or open sores. Avoid contact with your eyes, ears, mouth and genitals (private parts). Wash Face and genitals (private parts)  with your normal soap.   7. Wash thoroughly, paying special attention to the area where your surgery will be performed.  8. Thoroughly rinse your body with  warm water from the neck down.  9. DO NOT shower/wash with your normal soap after using and rinsing off the CHG Soap.  10. Pat yourself dry with a CLEAN TOWEL.  11. Wear CLEAN PAJAMAS to bed the night before surgery.  12. Place CLEAN SHEETS on your bed the night before your surgery.  13. DO NOT SLEEP WITH PETS.   Day of Surgery: SHOWER with CHG soap. Brush your teeth WITH YOUR REGULAR TOOTHPASTE. Wear Clean/Comfortable clothing the morning of surgery. Do not apply any deodorants/lotions.   Do not wear jewelry, make up, or nail polish.  Do not  shave 48 hours prior to surgery.  Do NOT Smoke (Tobacco/Vaping) or drink Alcohol 24 hours prior to your procedure. Do not bring valuables to the hospital. [facility] is not responsible for any belongings or valuables.  If you use a CPAP at night, you may bring all equipment for your overnight stay.   Contacts, glasses, or dentures may not be worn into surgery, please bring cases for these belongings.   For patients admitted to the hospital, discharge time will be determined by your treatment team.   Patients discharged the day of surgery will not be allowed to drive home, and someone needs to stay with them for 24 hours.    Please read over the following fact sheets that you were given.

## 2020-12-22 ENCOUNTER — Other Ambulatory Visit (HOSPITAL_COMMUNITY): Payer: Medicare Other

## 2020-12-22 LAB — SARS CORONAVIRUS 2 (TAT 6-24 HRS): SARS Coronavirus 2: NEGATIVE

## 2020-12-23 NOTE — H&P (Signed)
Cassidy Olsen  Location: [facility] Surgery Patient #: [id number] DOB: 14-Jul-1961 Single / Language: Cleophus Molt / Race: Black or African American Female   History of Present Illness The patient is a 60 year old female who presents for evaluation of gall stones.  Chief complaint: Right upper quadrant abdominal pain  This is a 60 year old female referred by gastroenterology for symptomatic gallstones. She has a long history of food intolerance, irritable bowel, including intolerance, and gallstones seen on previous ultrasounds. She also has history of chronic headaches, chronic pain, asthma, kidney stones. Her pain can be moderate to severe. She can occasionally have nausea and pain into the chest. She can have loose bowel movements as well. Cholecystectomy has been recommended.   Past Surgical History Andreas Blower, [place];  Appendectomy  Foot Surgery  Left. Spinal Surgery - Neck   Diagnostic Studies History Andreas Blower, CMA;   Colonoscopy  1-5 years ago Mammogram  within last year Pap Smear  1-5 years ago  Allergies (Armen Glo Herring, CMA Aspirin *ANALGESICS - NonNarcotic*  Ketorolac Tromethamine *ANALGESICS - ANTI-INFLAMMATORY*  Itching, Shortness of breath. Penicillins  Hives, Itching, Rash. Codeine and Related  Ibuprofen *ANALGESICS - ANTI-INFLAMMATORY*  OMEPRAZOLE SODIUM  PANTOPRAZOLE  Pregabalin *ANTICONVULSANTS*  penicillAMINE *CHEMICALS*  Rash. Allergies Reconciled   Medication History  Tylenol (325MG  Tablet, Oral) Active. Benzonatate (100MG  Capsule, Oral) Active. Budesonide-Formoterol Fumarate (80-4.5MCG/ACT Aerosol, Inhalation) Active. Sucralfate (1GM Tablet, Oral) Active. Promethazine-DM (6.25-15MG /5ML Syrup, Oral) Active. Emgality (120MG /ML Soln Auto-inj, Subcutaneous) Active. Dicyclomine HCl (10MG  Capsule, Oral) Active. valACYclovir HCl (1GM Tablet, Oral) Active. Gabapentin (300MG  Capsule, Oral) Active. Celecoxib (200MG   Capsule, Oral) Active. Amitriptyline HCl (50MG  Tablet, Oral) Active. EPINEPHrine (0.3MG /0.3ML Soln Auto-inj, Injection) Active. Lubiprostone (24MCG Capsule, Oral) Active. Lidocaine Viscous HCl (2% Solution, Mouth/Throat) Active. Medications Reconciled  Social History Andreas Blower, CMA;  Alcohol use  Occasional alcohol use. Caffeine use  Coffee. Tobacco use  Never smoker.  Family History (Andreas Blower, [place];  Arthritis  Father. Breast Cancer  Sister. Colon Polyps  Father. Depression  Daughter, Mother, Sister. Heart disease in female family member before age 61  Migraine Headache  Family Members In General. Prostate Cancer  Father.  Pregnancy / Birth History Andreas Blower, CMA;) Age at menarche  73 years. Age of menopause  <46 Gravida  3 Length (months) of breastfeeding  3-6 Maternal age  9-20  Other Problems (Armen Glo Herring, [place]; Arthritis  Asthma  Back Pain  Cholelithiasis  Gastroesophageal Reflux Disease  Hypercholesterolemia  Kidney Stone  Migraine Headache     Review of Systems  General Not Present- Appetite Loss, Chills, Fatigue, Fever, Night Sweats, Weight Gain and Weight Loss. Skin Not Present- Change in Wart/Mole, Dryness, Hives, Jaundice, New Lesions, Non-Healing Wounds, Rash and Ulcer. HEENT Present- Seasonal Allergies and Wears glasses/contact lenses. Not Present- Earache, Hearing Loss, Hoarseness, Nose Bleed, Oral Ulcers, Ringing in the Ears, Sinus Pain, Sore Throat, Visual Disturbances and Yellow Eyes. Gastrointestinal Present- Abdominal Pain, Bloating, Constipation and Excessive gas. Not Present- Bloody Stool, Change in Bowel Habits, Chronic diarrhea, Difficulty Swallowing, Gets full quickly at meals, Hemorrhoids, Indigestion, Nausea, Rectal Pain and Vomiting. Female Genitourinary Present- Painful Urination. Not Present- Frequency, Nocturia, Pelvic Pain and Urgency. Musculoskeletal Present- Back Pain, Joint Pain and Joint  Stiffness. Not Present- Muscle Pain, Muscle Weakness and Swelling of Extremities.  Vitals   Weight: 169 lb Height: 61.5in Body Surface Area: 1.77 m Body Mass Index: 31.41 kg/m  Temp.: 97.92F  Pulse: 103 (Regular)  P.OX: 97% (Room air) BP: 140/60(Sitting, Left Arm, Standard)  Physical Exam  The physical exam findings are as follows: Note: She appears well on exam.  Her abdomen is soft and nontender. There are no masses. There is no hepatomegaly.  There is no jaundice  Lungs clear CV RRR Skin without rash   Assessment & Plan   SYMPTOMATIC CHOLELITHIASIS (K80.20)  Impression: I reviewed her notes from [facility] records including his gastroenterologist, previous CT scans, and ultrasounds. Again, she has symptomatic cholelithiasis. I do believe a lot of her GI complaints as well as her chest pain are because of her gallstones. I discussed this with her in detail. We discussed cholecystectomy. I discussed the procedure in detail. The patient was given Neurosurgeon. We discussed the risks and benefits of a laparoscopic cholecystectomy and possible cholangiogram including, but not limited to, bleeding, infection, injury to surrounding structures such as the intestine or liver, bile leak, retained gallstones, need to convert to an open procedure, prolonged diarrhea, blood clots such as DVT, common bile duct injury, anesthesia risks, and possible need for additional procedures. The likelihood of improvement in symptoms and return to the patient's normal status is good. We discussed the typical post-operative recovery course. All questions were answered.  She believes she will proceed with surgery.  I would plan doing this at the main hospital giving her history of asthma, severe chronic pain, different neuralgias, etc. I'll also observe her overnight because of these issues which can become worse with general anesthesia.

## 2020-12-24 ENCOUNTER — Ambulatory Visit (HOSPITAL_COMMUNITY): Payer: Medicare Other | Admitting: Anesthesiology

## 2020-12-24 ENCOUNTER — Observation Stay (HOSPITAL_COMMUNITY)
Admission: RE | Admit: 2020-12-24 | Discharge: 2020-12-25 | Disposition: A | Discharge status: Home / Self Care | Payer: Medicare Other | Attending: Surgery | Admitting: Surgery

## 2020-12-24 ENCOUNTER — Encounter (HOSPITAL_COMMUNITY)
Admission: RE | Disposition: A | Discharge status: Home / Self Care | Payer: Self-pay | Source: Home / Self Care | Attending: Surgery

## 2020-12-24 ENCOUNTER — Encounter (HOSPITAL_COMMUNITY): Payer: Self-pay | Admitting: Surgery

## 2020-12-24 DIAGNOSIS — Z9049 Acquired absence of other specified parts of digestive tract: Secondary | ICD-10-CM

## 2020-12-24 DIAGNOSIS — Z79899 Other long term (current) drug therapy: Secondary | ICD-10-CM | POA: Diagnosis not present

## 2020-12-24 DIAGNOSIS — J45909 Unspecified asthma, uncomplicated: Secondary | ICD-10-CM | POA: Insufficient documentation

## 2020-12-24 DIAGNOSIS — K8010 Calculus of gallbladder with chronic cholecystitis without obstruction: Secondary | ICD-10-CM | POA: Diagnosis not present

## 2020-12-24 DIAGNOSIS — R1011 Right upper quadrant pain: Secondary | ICD-10-CM | POA: Diagnosis present

## 2020-12-24 DIAGNOSIS — I10 Essential (primary) hypertension: Secondary | ICD-10-CM | POA: Diagnosis not present

## 2020-12-24 DIAGNOSIS — R7303 Prediabetes: Secondary | ICD-10-CM | POA: Diagnosis not present

## 2020-12-24 DIAGNOSIS — E7800 Pure hypercholesterolemia, unspecified: Secondary | ICD-10-CM | POA: Diagnosis not present

## 2020-12-24 HISTORY — PX: CHOLECYSTECTOMY: SHX55

## 2020-12-24 LAB — GLUCOSE, CAPILLARY: Glucose-Capillary: 180 mg/dL — ABNORMAL HIGH (ref 70–99)

## 2020-12-24 SURGERY — LAPAROSCOPIC CHOLECYSTECTOMY
Anesthesia: General | Site: Abdomen

## 2020-12-24 MED ORDER — AMITRIPTYLINE HCL 50 MG PO TABS
50.0000 mg | ORAL_TABLET | Freq: Every day | ORAL | Status: DC
  Administered 2020-12-24: 50 mg via ORAL
  Filled 2020-12-24: qty 1

## 2020-12-24 MED ORDER — ACETAMINOPHEN 500 MG PO TABS: 1000.0000 mg | ORAL_TABLET | ORAL | Status: DC

## 2020-12-24 MED ORDER — LIDOCAINE 2% (20 MG/ML) 5 ML SYRINGE
INTRAMUSCULAR | Status: AC
  Filled 2020-12-24: qty 5

## 2020-12-24 MED ORDER — METHOCARBAMOL 500 MG PO TABS
ORAL_TABLET | ORAL | Status: AC
  Filled 2020-12-24: qty 1

## 2020-12-24 MED ORDER — DIPHENHYDRAMINE HCL 25 MG PO CAPS: 25.0000 mg | ORAL_CAPSULE | Freq: Four times a day (QID) | ORAL | Status: DC | PRN

## 2020-12-24 MED ORDER — FENTANYL CITRATE (PF) 100 MCG/2ML IJ SOLN
INTRAMUSCULAR | Status: AC
  Filled 2020-12-24: qty 2

## 2020-12-24 MED ORDER — CHLORHEXIDINE GLUCONATE 0.12 % MT SOLN
OROMUCOSAL | Status: AC
  Administered 2020-12-24: 15 mL via OROMUCOSAL
  Filled 2020-12-24: qty 15

## 2020-12-24 MED ORDER — METHOCARBAMOL 500 MG PO TABS
250.0000 mg | ORAL_TABLET | Freq: Every day | ORAL | Status: DC
  Administered 2020-12-24 (×2): 500 mg via ORAL
  Filled 2020-12-24: qty 1

## 2020-12-24 MED ORDER — FENTANYL CITRATE (PF) 100 MCG/2ML IJ SOLN
25.0000 ug | INTRAMUSCULAR | Status: DC | PRN
  Administered 2020-12-24: 50 ug via INTRAVENOUS

## 2020-12-24 MED ORDER — ORAL CARE MOUTH RINSE: 15.0000 mL | Freq: Once | OROMUCOSAL | Status: AC

## 2020-12-24 MED ORDER — CHLORHEXIDINE GLUCONATE 0.12 % MT SOLN: 15.0000 mL | Freq: Once | OROMUCOSAL | Status: AC

## 2020-12-24 MED ORDER — POTASSIUM CHLORIDE IN NACL 20-0.9 MEQ/L-% IV SOLN
INTRAVENOUS | Status: DC
  Filled 2020-12-24: qty 1000

## 2020-12-24 MED ORDER — ONDANSETRON HCL 4 MG/2ML IJ SOLN
INTRAMUSCULAR | Status: AC
  Filled 2020-12-24: qty 2

## 2020-12-24 MED ORDER — 0.9 % SODIUM CHLORIDE (POUR BTL) OPTIME
TOPICAL | Status: DC | PRN
  Administered 2020-12-24: 1000 mL

## 2020-12-24 MED ORDER — SODIUM CHLORIDE 0.9 % IR SOLN
Status: DC | PRN
  Administered 2020-12-24: 1000 mL

## 2020-12-24 MED ORDER — BUPIVACAINE HCL (PF) 0.25 % IJ SOLN
INTRAMUSCULAR | Status: AC
  Filled 2020-12-24: qty 30

## 2020-12-24 MED ORDER — ENSURE PRE-SURGERY PO LIQD: 296.0000 mL | Freq: Once | ORAL | Status: DC

## 2020-12-24 MED ORDER — CIPROFLOXACIN IN D5W 400 MG/200ML IV SOLN
INTRAVENOUS | Status: AC
  Filled 2020-12-24: qty 200

## 2020-12-24 MED ORDER — ALBUTEROL SULFATE HFA 108 (90 BASE) MCG/ACT IN AERS
2.0000 | INHALATION_SPRAY | Freq: Four times a day (QID) | RESPIRATORY_TRACT | Status: DC | PRN
  Filled 2020-12-24: qty 6.7

## 2020-12-24 MED ORDER — HYDROMORPHONE HCL 1 MG/ML IJ SOLN
1.0000 mg | INTRAMUSCULAR | Status: DC | PRN
  Administered 2020-12-24 – 2020-12-25 (×2): 1 mg via INTRAVENOUS
  Filled 2020-12-24 (×2): qty 1

## 2020-12-24 MED ORDER — ACETAMINOPHEN 500 MG PO TABS
1000.0000 mg | ORAL_TABLET | Freq: Three times a day (TID) | ORAL | Status: DC | PRN
  Administered 2020-12-24: 1000 mg via ORAL
  Filled 2020-12-24: qty 2

## 2020-12-24 MED ORDER — ONDANSETRON 4 MG PO TBDP: 4.0000 mg | ORAL_TABLET | Freq: Four times a day (QID) | ORAL | Status: DC | PRN

## 2020-12-24 MED ORDER — GABAPENTIN 300 MG PO CAPS
600.0000 mg | ORAL_CAPSULE | Freq: Three times a day (TID) | ORAL | Status: DC
  Administered 2020-12-24 (×2): 600 mg via ORAL
  Filled 2020-12-24 (×2): qty 2

## 2020-12-24 MED ORDER — FENTANYL CITRATE (PF) 100 MCG/2ML IJ SOLN
INTRAMUSCULAR | Status: DC | PRN
  Administered 2020-12-24: 25 ug via INTRAVENOUS
  Administered 2020-12-24: 100 ug via INTRAVENOUS
  Administered 2020-12-24: 50 ug via INTRAVENOUS

## 2020-12-24 MED ORDER — BUPIVACAINE HCL (PF) 0.25 % IJ SOLN
INTRAMUSCULAR | Status: DC | PRN
  Administered 2020-12-24: 10 mL

## 2020-12-24 MED ORDER — LUBIPROSTONE 24 MCG PO CAPS
24.0000 ug | ORAL_CAPSULE | Freq: Two times a day (BID) | ORAL | Status: DC
  Administered 2020-12-24 – 2020-12-25 (×2): 24 ug via ORAL
  Filled 2020-12-24 (×2): qty 1

## 2020-12-24 MED ORDER — ROCURONIUM BROMIDE 100 MG/10ML IV SOLN
INTRAVENOUS | Status: DC | PRN
  Administered 2020-12-24: 50 mg via INTRAVENOUS

## 2020-12-24 MED ORDER — ONDANSETRON HCL 4 MG/2ML IJ SOLN
4.0000 mg | Freq: Four times a day (QID) | INTRAMUSCULAR | Status: DC | PRN
  Administered 2020-12-24: 4 mg via INTRAVENOUS
  Filled 2020-12-24: qty 2

## 2020-12-24 MED ORDER — DEXAMETHASONE SODIUM PHOSPHATE 10 MG/ML IJ SOLN
INTRAMUSCULAR | Status: DC | PRN
  Administered 2020-12-24: 5 mg via INTRAVENOUS

## 2020-12-24 MED ORDER — FENTANYL CITRATE (PF) 250 MCG/5ML IJ SOLN
INTRAMUSCULAR | Status: AC
  Filled 2020-12-24: qty 5

## 2020-12-24 MED ORDER — TRAMADOL HCL 50 MG PO TABS
ORAL_TABLET | ORAL | Status: AC
  Filled 2020-12-24: qty 1

## 2020-12-24 MED ORDER — ACETAMINOPHEN 500 MG PO TABS
ORAL_TABLET | ORAL | Status: AC
  Filled 2020-12-24: qty 2

## 2020-12-24 MED ORDER — ROCURONIUM BROMIDE 10 MG/ML (PF) SYRINGE
PREFILLED_SYRINGE | INTRAVENOUS | Status: AC
  Filled 2020-12-24: qty 10

## 2020-12-24 MED ORDER — PHENYLEPHRINE HCL-NACL 10-0.9 MG/250ML-% IV SOLN
INTRAVENOUS | Status: DC | PRN
  Administered 2020-12-24: 60 ug/min via INTRAVENOUS

## 2020-12-24 MED ORDER — CELECOXIB 200 MG PO CAPS
200.0000 mg | ORAL_CAPSULE | Freq: Two times a day (BID) | ORAL | Status: DC
  Administered 2020-12-24 (×2): 200 mg via ORAL
  Filled 2020-12-24 (×3): qty 1

## 2020-12-24 MED ORDER — CHLORHEXIDINE GLUCONATE CLOTH 2 % EX PADS: 6.0000 | MEDICATED_PAD | Freq: Once | CUTANEOUS | Status: DC

## 2020-12-24 MED ORDER — SUGAMMADEX SODIUM 200 MG/2ML IV SOLN
INTRAVENOUS | Status: DC | PRN
  Administered 2020-12-24: 200 mg via INTRAVENOUS

## 2020-12-24 MED ORDER — MIDAZOLAM HCL 2 MG/2ML IJ SOLN
INTRAMUSCULAR | Status: DC | PRN
  Administered 2020-12-24: 2 mg via INTRAVENOUS

## 2020-12-24 MED ORDER — MIDAZOLAM HCL 2 MG/2ML IJ SOLN
INTRAMUSCULAR | Status: AC
  Filled 2020-12-24: qty 2

## 2020-12-24 MED ORDER — PHENYLEPHRINE 40 MCG/ML (10ML) SYRINGE FOR IV PUSH (FOR BLOOD PRESSURE SUPPORT)
PREFILLED_SYRINGE | INTRAVENOUS | Status: DC | PRN
  Administered 2020-12-24 (×2): 80 ug via INTRAVENOUS
  Administered 2020-12-24: 200 ug via INTRAVENOUS

## 2020-12-24 MED ORDER — PROPOFOL 10 MG/ML IV BOLUS
INTRAVENOUS | Status: AC
  Filled 2020-12-24: qty 20

## 2020-12-24 MED ORDER — ENOXAPARIN SODIUM 40 MG/0.4ML IJ SOSY
40.0000 mg | PREFILLED_SYRINGE | INTRAMUSCULAR | Status: DC
  Administered 2020-12-25: 40 mg via SUBCUTANEOUS
  Filled 2020-12-24 (×2): qty 0.4

## 2020-12-24 MED ORDER — LACTATED RINGERS IV SOLN: INTRAVENOUS | Status: DC

## 2020-12-24 MED ORDER — DEXAMETHASONE SODIUM PHOSPHATE 10 MG/ML IJ SOLN
INTRAMUSCULAR | Status: AC
  Filled 2020-12-24: qty 1

## 2020-12-24 MED ORDER — LIDOCAINE HCL (CARDIAC) PF 100 MG/5ML IV SOSY
PREFILLED_SYRINGE | INTRAVENOUS | Status: DC | PRN
  Administered 2020-12-24: 100 mg via INTRAVENOUS

## 2020-12-24 MED ORDER — DIPHENHYDRAMINE HCL 50 MG/ML IJ SOLN: 25.0000 mg | Freq: Four times a day (QID) | INTRAMUSCULAR | Status: DC | PRN

## 2020-12-24 MED ORDER — TRAMADOL HCL 50 MG PO TABS
50.0000 mg | ORAL_TABLET | Freq: Four times a day (QID) | ORAL | Status: DC | PRN
  Administered 2020-12-24 – 2020-12-25 (×3): 50 mg via ORAL
  Filled 2020-12-24 (×4): qty 1

## 2020-12-24 MED ORDER — ONDANSETRON HCL 4 MG/2ML IJ SOLN
INTRAMUSCULAR | Status: DC | PRN
  Administered 2020-12-24: 4 mg via INTRAVENOUS

## 2020-12-24 MED ORDER — CIPROFLOXACIN IN D5W 400 MG/200ML IV SOLN
400.0000 mg | INTRAVENOUS | Status: AC
  Administered 2020-12-24: 400 mg via INTRAVENOUS

## 2020-12-24 MED ORDER — PHENYLEPHRINE 40 MCG/ML (10ML) SYRINGE FOR IV PUSH (FOR BLOOD PRESSURE SUPPORT)
PREFILLED_SYRINGE | INTRAVENOUS | Status: AC
  Filled 2020-12-24: qty 10

## 2020-12-24 MED ORDER — PROPOFOL 10 MG/ML IV BOLUS
INTRAVENOUS | Status: DC | PRN
  Administered 2020-12-24: 160 mg via INTRAVENOUS

## 2020-12-24 SURGICAL SUPPLY — 32 items
APPLIER CLIP 5 13 M/L LIGAMAX5 (MISCELLANEOUS) ×2
CANISTER SUCT 3000ML PPV (MISCELLANEOUS) ×2 IMPLANT
CHLORAPREP W/TINT 26 (MISCELLANEOUS) ×2 IMPLANT
CLIP APPLIE 5 13 M/L LIGAMAX5 (MISCELLANEOUS) ×1 IMPLANT
COVER SURGICAL LIGHT HANDLE (MISCELLANEOUS) ×2 IMPLANT
COVER WAND RF STERILE (DRAPES) ×2 IMPLANT
DERMABOND ADVANCED (GAUZE/BANDAGES/DRESSINGS) ×1
DERMABOND ADVANCED .7 DNX12 (GAUZE/BANDAGES/DRESSINGS) ×1 IMPLANT
ELECT REM PT RETURN 9FT ADLT (ELECTROSURGICAL) ×2
ELECTRODE REM PT RTRN 9FT ADLT (ELECTROSURGICAL) ×1 IMPLANT
GLOVE SURG SIGNA 7.5 PF LTX (GLOVE) ×2 IMPLANT
GOWN STRL REUS W/ TWL LRG LVL3 (GOWN DISPOSABLE) ×2 IMPLANT
GOWN STRL REUS W/ TWL XL LVL3 (GOWN DISPOSABLE) ×1 IMPLANT
GOWN STRL REUS W/TWL LRG LVL3 (GOWN DISPOSABLE) ×2
GOWN STRL REUS W/TWL XL LVL3 (GOWN DISPOSABLE) ×1
KIT BASIN OR (CUSTOM PROCEDURE TRAY) ×2 IMPLANT
KIT TURNOVER KIT B (KITS) ×2 IMPLANT
NS IRRIG 1000ML POUR BTL (IV SOLUTION) ×2 IMPLANT
PAD ARMBOARD 7.5X6 YLW CONV (MISCELLANEOUS) ×2 IMPLANT
POUCH SPECIMEN RETRIEVAL 10MM (ENDOMECHANICALS) ×2 IMPLANT
SCISSORS LAP 5X35 DISP (ENDOMECHANICALS) ×2 IMPLANT
SET IRRIG TUBING LAPAROSCOPIC (IRRIGATION / IRRIGATOR) ×2 IMPLANT
SET TUBE SMOKE EVAC HIGH FLOW (TUBING) ×2 IMPLANT
SLEEVE ENDOPATH XCEL 5M (ENDOMECHANICALS) ×4 IMPLANT
SPECIMEN JAR SMALL (MISCELLANEOUS) ×2 IMPLANT
SUT MNCRL AB 4-0 PS2 18 (SUTURE) ×2 IMPLANT
TOWEL GREEN STERILE (TOWEL DISPOSABLE) ×2 IMPLANT
TOWEL GREEN STERILE FF (TOWEL DISPOSABLE) ×2 IMPLANT
TRAY LAPAROSCOPIC MC (CUSTOM PROCEDURE TRAY) ×2 IMPLANT
TROCAR XCEL BLUNT TIP 100MML (ENDOMECHANICALS) ×2 IMPLANT
TROCAR XCEL NON-BLD 5MMX100MML (ENDOMECHANICALS) ×2 IMPLANT
WATER STERILE IRR 1000ML POUR (IV SOLUTION) ×2 IMPLANT

## 2020-12-24 NOTE — Transfer of Care (Signed)
Immediate Anesthesia Transfer of Care Note  Patient: Cassidy Olsen  Procedure(s) Performed: LAPAROSCOPIC CHOLECYSTECTOMY (N/A Abdomen)  Patient Location: PACU  Anesthesia Type:General  Level of Consciousness: sedated  Airway & Oxygen Therapy: Patient Spontanous Breathing and Patient connected to nasal cannula oxygen  Post-op Assessment: Report given to RN  Post vital signs: Reviewed and stable  Last Vitals:  Vitals Value Taken Time  BP 189/113 12/24/20 1032  Temp 36.6 C 12/24/20 1032  Pulse 101 12/24/20 1032  Resp 23 12/24/20 1032  SpO2 100 % 12/24/20 1032    Last Pain:  Vitals:   12/24/20 0742  TempSrc:   PainSc: 5       Patients Stated Pain Goal: 5 (88/11/03 1594)  Complications: No complications documented.

## 2020-12-24 NOTE — Anesthesia Postprocedure Evaluation (Signed)
Anesthesia Post Note  Patient: Cassidy Olsen  Procedure(s) Performed: LAPAROSCOPIC CHOLECYSTECTOMY (N/A Abdomen)     Patient location during evaluation: PACU Anesthesia Type: General Level of consciousness: awake Pain management: pain level controlled Vital Signs Assessment: post-procedure vital signs reviewed and stable Respiratory status: spontaneous breathing Cardiovascular status: stable Postop Assessment: no apparent nausea or vomiting Anesthetic complications: no   No complications documented.  Last Vitals:  Vitals:   12/24/20 1155 12/24/20 1222  BP: (!) 147/95 (!) 160/98  Pulse: 87 87  Resp: 15 20  Temp: (!) 36.3 C 36.5 C  SpO2:  94%    Last Pain:  Vitals:   12/24/20 1222  TempSrc: Oral  PainSc:                  Cassidy Olsen

## 2020-12-24 NOTE — Op Note (Signed)
Laparoscopic Cholecystectomy Procedure Note  Indications: This patient presents with symptomatic gallbladder disease and will undergo laparoscopic cholecystectomy.   Pre-operative Diagnosis: Symptomatic cholelithiasis  Post-operative Diagnosis: Chronic cholecystitis   Surgeon: Dr. Coralie Keens, MD   Assistants: Dr. Wynelle Link, MD  Anesthesia: General endotracheal anesthesia  ASA Class: 2  Procedure Details  The patient was seen again in the Holding Room. The risks, benefits, complications, treatment options, and expected outcomes were discussed with the patient. The possibilities of reaction to medication, pulmonary aspiration, perforation of viscus, bleeding, recurrent infection, finding a normal gallbladder, the need for additional procedures, failure to diagnose a condition, the possible need to convert to an open procedure, and creating a complication requiring transfusion or operation were discussed with the patient. The likelihood of improving the patient's symptoms with return to their baseline status is good.  The patient and/or family concurred with the proposed plan, giving informed consent. The site of surgery properly noted. The patient was taken to Operating Room, identified as Cassidy Olsen and the procedure verified as Laparoscopic Cholecystectomy. A Time Out was held and the above information confirmed.  Prior to the induction of general anesthesia, antibiotic prophylaxis was administered with ciprofloxacin. General endotracheal anesthesia was then administered and tolerated well. After the induction, the abdomen was prepped with Chloraprep and draped in the sterile fashion. The patient was positioned in the supine position.  Local anesthetic agent was injected into the skin near the umbilicus and an incision made infraumbilically vertically. We dissected down to the abdominal fascia with blunt dissection.  The fascia was incised vertically with a scalpel and we  entered the peritoneal cavity bluntly.  A pursestring suture of 0-Vicryl was placed around the fascial opening.  The Hasson cannula was inserted and secured with the stay suture.  Pneumoperitoneum was then created with CO2 and tolerated well without any adverse changes in the patient's vital signs. A 5-mm port was placed in the subxiphoid position.  Two 5-mm ports were placed in the right upper quadrant.   We positioned the patient in reverse Trendelenburg, tilted slightly to the patient's left.  The gallbladder was identified, the fundus grasped and retracted cephalad. Mild omental adhesions were encountered and lysed bluntly where indicated, taking care not to injure any adjacent organs or viscus. The infundibulum was grasped and retracted laterally, exposing the peritoneum overlying the triangle of Calot. This was then divided and exposed in a blunt fashion. A critical view of the cystic duct and cystic artery was obtained.  The cystic duct was clearly identified and bluntly dissected circumferentially. The cystic duct was then ligated with three metal clips on the patient's side and divided. The cystic artery was identified, dissected free, ligated with two metal clips on the patient's side and divided as well.   The gallbladder was dissected from the liver bed in retrograde fashion with the electrocautery. The gallbladder was removed and placed in a laparoscopic specimen retrieval bag. The liver bed was irrigated and inspected. Hemostasis was achieved with the electrocautery. Copious irrigation was utilized and was repeatedly aspirated until clear.  The gallbladder and laparoscopic retrieval bag were then removed through the umbilical port site.  The pursestring suture was used to close the umbilical fascia.    We again inspected the right upper quadrant for hemostasis.  Pneumoperitoneum was released as we removed the trocars.  4-0 Monocryl was used to close the skin. Local anesthetic was injected at the  level of the soft tissue and fascia at all sites for  analgesia.  Dermabond was applied. The patient was then extubated and brought to the recovery room in stable condition. Instrument, sponge, and needle counts were correct at closure and at the conclusion of the case.   Findings: Mildly edematous, moderately thickened gallbladder compatible with chronic cholecystitis. No significant bile spillage, no stones spilled.  Estimated Blood Loss: Minimal         Drains: None         Specimens: Gallbladder           Complications: None; patient tolerated the procedure well.         Disposition: PACU - hemodynamically stable.         Condition: stable

## 2020-12-24 NOTE — Anesthesia Procedure Notes (Signed)
Procedure Name: Intubation Date/Time: 12/24/2020 9:25 AM Performed by: Asher Muir, CRNA Pre-anesthesia Checklist: Patient identified, Emergency Drugs available, Suction available and Patient being monitored Patient Re-evaluated:Patient Re-evaluated prior to induction Oxygen Delivery Method: Circle system utilized and Simple face mask Preoxygenation: Pre-oxygenation with 100% oxygen Induction Type: Combination inhalational/ intravenous induction Ventilation: Mask ventilation without difficulty Laryngoscope Size: Mac and 3 Grade View: Grade II Tube type: Oral Tube size: 7.0 mm Number of attempts: 1 Airway Equipment and Method: Stylet Placement Confirmation: ETT inserted through vocal cords under direct vision,  positive ETCO2 and breath sounds checked- equal and bilateral Secured at: 20 (right lip) cm Tube secured with: Tape Dental Injury: Teeth and Oropharynx as per pre-operative assessment

## 2020-12-24 NOTE — Interval H&P Note (Signed)
History and Physical Interval Note:no change in H and P  12/24/2020 8:39 AM  [name]  has presented today for surgery, with the diagnosis of symptomatic gallstones.  The various methods of treatment have been discussed with the patient and family. After consideration of risks, benefits and other options for treatment, the patient has consented to  Procedure(s): LAPAROSCOPIC CHOLECYSTECTOMY (N/A) as a surgical intervention.  The patient's history has been reviewed, patient examined, no change in status, stable for surgery.  I have reviewed the patient's chart and labs.  Questions were answered to the patient's satisfaction.     Coralie Keens

## 2020-12-24 NOTE — Anesthesia Preprocedure Evaluation (Signed)
Anesthesia Evaluation  Patient identified by MRN, date of birth, ID band Patient awake    Reviewed: Allergy & Precautions, NPO status , Patient's Chart, lab work & pertinent test results  Airway Mallampati: II  TM Distance: >3 FB     Dental   Pulmonary asthma , pneumonia,    breath sounds clear to auscultation       Cardiovascular hypertension,  Rhythm:Regular Rate:Normal     Neuro/Psych  Headaches,  Neuromuscular disease    GI/Hepatic Neg liver ROS, GERD  ,  Endo/Other    Renal/GU Renal disease     Musculoskeletal   Abdominal   Peds  Hematology   Anesthesia Other Findings   Reproductive/Obstetrics                             Anesthesia Physical Anesthesia Plan  ASA: III  Anesthesia Plan: General   Post-op Pain Management:    Induction: Intravenous  PONV Risk Score and Plan: 3 and Ondansetron, Dexamethasone and Midazolam  Airway Management Planned: Oral ETT  Additional Equipment:   Intra-op Plan:   Post-operative Plan: Extubation in OR  Informed Consent: I have reviewed the patients History and Physical, chart, labs and discussed the procedure including the risks, benefits and alternatives for the proposed anesthesia with the patient or authorized representative who has indicated his/her understanding and acceptance.     Dental advisory given  Plan Discussed with: CRNA and Anesthesiologist  Anesthesia Plan Comments:         Anesthesia Quick Evaluation

## 2020-12-25 ENCOUNTER — Encounter (HOSPITAL_COMMUNITY): Payer: Self-pay | Admitting: Surgery

## 2020-12-25 DIAGNOSIS — J45909 Unspecified asthma, uncomplicated: Secondary | ICD-10-CM | POA: Diagnosis not present

## 2020-12-25 DIAGNOSIS — Z79899 Other long term (current) drug therapy: Secondary | ICD-10-CM | POA: Diagnosis not present

## 2020-12-25 DIAGNOSIS — K8010 Calculus of gallbladder with chronic cholecystitis without obstruction: Secondary | ICD-10-CM | POA: Diagnosis not present

## 2020-12-25 LAB — SURGICAL PATHOLOGY

## 2020-12-25 MED ORDER — TRAMADOL HCL 50 MG PO TABS: 50.0000 mg | ORAL_TABLET | Freq: Four times a day (QID) | ORAL | 0 refills | Status: DC | PRN

## 2020-12-25 NOTE — Progress Notes (Signed)
Pt's ride can come at 0930 to pick her up. She has been ambulating in the hallway, ate bfast without any N/V, and is taking PO pain meds and urinating on her own.  Legrand Como PA asked if OK for her to leave at 0930 since the order was written for after lunch originally (which pt states this was based on her ride situation earlier). OK for her to leave at 0930.DC instructions reviewed and given a copy.

## 2020-12-25 NOTE — Discharge Summary (Signed)
Physician Discharge Summary  Patient ID: Cassidy Olsen MRN: [id number] DOB/AGE: [birth year] 60 y.o.  Admit date: 12/24/2020 Discharge date: 12/25/2020  Admission Diagnoses:  Discharge Diagnoses:  Active Problems:   S/P laparoscopic cholecystectomy   Discharged Condition: good  Hospital Course: uneventful post op recover.  Discharged home POD#1  Consults: None  Significant Diagnostic Studies:   Treatments: surgery: laparoscopic cholecystectomy  Discharge Exam: Blood pressure 130/89, pulse 97, temperature 97.6 F (36.4 C), temperature source Oral, resp. rate 15, height 5\' 2"  (1.575 m), weight 75.8 kg, SpO2 98 %. General appearance: alert, cooperative and no distress Resp: clear to auscultation bilaterally Incision/Wound:abdomen soft, non-distended, minimally tender, incisions clean  Disposition: Discharge disposition: 01-Home or Self Care        Allergies as of 12/25/2020      Reactions   Aspirin Other (See Comments), Nausea And Vomiting, Nausea Only   Stomach upset Avoids due to IBS Due to ibs Stomach upset Due to ibs Avoids due to IBS   Ketorolac Tromethamine Shortness Of Breath, Itching, Other (See Comments)   IM administration ONLY  REDNESS IM administration ONLY  REDNESS SWELLING OF THE THROAT  ORAL TORADOL CAUSED EXTREME REDNESS, ITCHINESS, DIFFICULTY BREATHING, AND FACIAL SWELLING. PATIENT ALSO DIAGNOSED WITH BELL'S PALSY.   Penicillins Hives, Itching, Rash, Other (See Comments)   Did it involve swelling of the face/tongue/throat, SOB, or low BP? No Did it involve sudden or severe rash/hives, skin peeling, or any reaction on the inside of your mouth or nose? Yes Did you need to seek medical attention at a hospital or doctor's office? No When did it last happen?2000 If all above answers are "NO", may proceed with cephalosporin use. Vaginal rash Vaginal rash Did it involve swelling of the face/tongue/throat, SOB, or low BP? No Did it involve  sudden or severe rash/hives, skin peeling, or any reaction on the inside of your mouth or nose? Yes Did you need to seek medical attention at a hospital or doctor's office? No When did it last happen?2000 If all above answers are "NO", may proceed with cephalosporin use. Vaginal rash Vaginal rash   Codeine Other (See Comments)   Cough meds with codeine-have withdraw symptoms when done Cough meds with codeine-have withdraw symptoms when done Cough meds with codeine-have withdraw symptoms when done   Ibuprofen Other (See Comments), Nausea And Vomiting   Stomach upset Avoids due to IBS Stomach upset Avoids due to IBS   Latex Rash, Other (See Comments)   Vaginal irritation Vaginal irritation Exacerbates genital herpes Exacerbates genital herpes   Omeprazole Magnesium Other (See Comments)   Lack of therapeutic effect Lack of therapeutic effect   Pantoprazole Sodium Other (See Comments)   Lack of therapeutic effect Lack of therapeutic effect   Penicillamine Rash   Pregabalin Other (See Comments)   Weight gain Weight gain      Medication List    TAKE these medications   acetaminophen 500 MG tablet Commonly known as: TYLENOL Take 1,000 mg by mouth every 8 (eight) hours as needed for moderate pain.   AeroChamber MV inhaler Use as instructed   albuterol 108 (90 Base) MCG/ACT inhaler Commonly known as: VENTOLIN HFA Inhale 2 puffs into the lungs every 6 (six) hours as needed for wheezing or shortness of breath.   amitriptyline 50 MG tablet Commonly known as: ELAVIL Take 1 tablet (50 mg total) by mouth at bedtime.   benzonatate 100 MG capsule Commonly known as: TESSALON Take 1-2 capsules (100-200 mg total) by mouth 3 (three)  times daily as needed for cough.   budesonide-formoterol 80-4.5 MCG/ACT inhaler Commonly known as: SYMBICORT Inhale 2 puffs into the lungs 2 (two) times daily as needed (asthma).   celecoxib 200 MG capsule Commonly known as: CELEBREX Take 200  mg by mouth 2 (two) times daily.   dexlansoprazole 60 MG capsule Commonly known as: DEXILANT Take 60 mg by mouth daily.   dicyclomine 10 MG capsule Commonly known as: BENTYL TAKE 2 CAPSULES BY MOUTH 3 TIMES DAILY BEFORE MEALS. What changed: See the new instructions.   Emgality 120 MG/ML Soaj Generic drug: Galcanezumab-gnlm Inject 120 mg into the skin every 30 (thirty) days.   fenofibrate 145 MG tablet Commonly known as: TRICOR Take 1 tablet (145 mg total) by mouth daily.   fluticasone 50 MCG/ACT nasal spray Commonly known as: FLONASE Place 2 sprays into both nostrils daily.   gabapentin 300 MG capsule Commonly known as: NEURONTIN Take 2 capsules (600 mg total) by mouth 3 (three) times daily.   lubiprostone 24 MCG capsule Commonly known as: AMITIZA TAKE 1 CAPSULE (24 MCG TOTAL) BY MOUTH 2 (TWO) TIMES DAILY WITH A MEAL.   magnesium oxide 400 MG tablet Commonly known as: MAG-OX Take 400 mg by mouth daily.   methocarbamol 500 MG tablet Commonly known as: ROBAXIN Take 250-500 mg by mouth at bedtime.   montelukast 10 MG tablet Commonly known as: SINGULAIR Take 10 mg by mouth at bedtime.   Mouthwash Compounding Base Liqd Swish and spit 15 mLs 4 (four) times daily as needed. Benadryl 12.5 mg/mL-80 mL; Viscous lidocaine 2% - 80 mL; Maalox 80 mL   PROBIOTIC DAILY PO Take 1 capsule by mouth daily.   promethazine-dextromethorphan 6.25-15 MG/5ML syrup Commonly known as: PROMETHAZINE-DM Take 5 mLs by mouth at bedtime as needed for cough.   sucralfate 1 g tablet Commonly known as: Carafate Take 1 tablet (1 g total) by mouth 4 (four) times daily -  with meals and at bedtime.   traMADol 50 MG tablet Commonly known as: ULTRAM Take 50 mg by mouth every 6 (six) hours as needed for severe pain. What changed: Another medication with the same name was added. Make sure you understand how and when to take each.   traMADol 50 MG tablet Commonly known as: ULTRAM Take 1 tablet (50  mg total) by mouth every 6 (six) hours as needed. What changed: You were already taking a medication with the same name, and this prescription was added. Make sure you understand how and when to take each.   valACYclovir 500 MG tablet Commonly known as: VALTREX Take 500 mg by mouth 2 (two) times daily as needed (outbreaks). What changed: Another medication with the same name was changed. Make sure you understand how and when to take each.   valACYclovir 1000 MG tablet Commonly known as: VALTREX Take 1 tablet (1,000 mg total) by mouth 3 (three) times daily. What changed:   when to take this  reasons to take this       Follow-up Information    Coralie Keens, MD. Schedule an appointment as soon as possible for a visit in 3 week(s).   Specialty: General Surgery Contact information: [place] [address] [phone]               Signed: Coralie Keens 12/25/2020, 6:55 AM

## 2020-12-25 NOTE — Progress Notes (Signed)
Patient ID: Cassidy Olsen, female   DOB: Aug 26, 1960, 60 y.o.   MRN: [id number]  Comfortable this morning with mild RUQ pain post op Abdomen soft on exam  Plan: Discharge home after lunch

## 2020-12-25 NOTE — Discharge Instructions (Signed)
[facility] ______[facility] LAPAROSCOPIC SURGERY: POST OP INSTRUCTIONS Always review your discharge instruction sheet given to you by the facility where your surgery was performed. IF YOU HAVE DISABILITY OR FAMILY LEAVE FORMS, YOU MUST BRING THEM TO THE OFFICE FOR PROCESSING.   DO NOT GIVE THEM TO YOUR DOCTOR.  1. A prescription for pain medication may be given to you upon discharge.  Take your pain medication as prescribed, if needed.  If narcotic pain medicine is not needed, then you may take acetaminophen (Tylenol) or ibuprofen (Advil) as needed. 2. Take your usually prescribed medications unless otherwise directed. 3. If you need a refill on your pain medication, please contact your pharmacy.  They will contact our office to request authorization. Prescriptions will not be filled after 5pm or on week-ends. 4. You should follow a light diet the first few days after arrival home, such as soup and crackers, etc.  Be sure to include lots of fluids daily. 5. Most patients will experience some swelling and bruising in the area of the incisions.  Ice packs will help.  Swelling and bruising can take several days to resolve.  6. It is common to experience some constipation if taking pain medication after surgery.  Increasing fluid intake and taking a stool softener (such as Colace) will usually help or prevent this problem from occurring.  A mild laxative (Milk of Magnesia or Miralax) should be taken according to package instructions if there are no bowel movements after 48 hours. 7. Unless discharge instructions indicate otherwise, you may remove your bandages 24-48 hours after surgery, and you may shower at that time.  You may have steri-strips (small skin tapes) in place directly over the incision.  These strips should be left on the skin for 7-10 days.  If your surgeon used skin glue on the incision, you may shower in 24 hours.  The glue will flake off over the next 2-3 weeks.  Any sutures or  staples will be removed at the office during your follow-up visit. 8. ACTIVITIES:  You may resume regular (light) daily activities beginning the next day--such as daily self-care, walking, climbing stairs--gradually increasing activities as tolerated.  You may have sexual intercourse when it is comfortable.  Refrain from any heavy lifting or straining until approved by your doctor. a. You may drive when you are no longer taking prescription pain medication, you can comfortably wear a seatbelt, and you can safely maneuver your car and apply brakes. b. RETURN TO WORK:  __________________________________________________________ 9. You should see your doctor in the office for a follow-up appointment approximately 2-3 weeks after your surgery.  Make sure that you call for this appointment within a day or two after you arrive home to insure a convenient appointment time. 10. OTHER INSTRUCTIONS:OK TO SHOWER TODAY 11. ICE PACK, TYLENOL ALSO FOR PAIN 12. NO LIFTING MORE THAN 15 TO 20 POUNDS FOR 2 WEEKS __________________________________________________________________________________________________________________________ __________________________________________________________________________________________________________________________ WHEN TO CALL YOUR DOCTOR: 1. Fever over 101.0 2. Inability to urinate 3. Continued bleeding from incision. 4. Increased pain, redness, or drainage from the incision. 5. Increasing abdominal pain  The clinic staff is available to answer your questions during regular business hours.  Please don't hesitate to call and ask to speak to one of the nurses for clinical concerns.  If you have a medical emergency, go to the nearest emergency room or call 911.  A surgeon from [facility] is always on call at the hospital. [address], [place], [place],  [place]  [address] ? [address], [place], [place]   [address] [phone] ? [phone] ? [phone]  [phone] Web site: [email or web address]

## 2021-01-06 ENCOUNTER — Telehealth: Payer: Self-pay | Admitting: Neurology

## 2021-01-06 ENCOUNTER — Ambulatory Visit (INDEPENDENT_AMBULATORY_CARE_PROVIDER_SITE_OTHER): Payer: Medicare Other | Admitting: Family Medicine

## 2021-01-06 ENCOUNTER — Telehealth: Payer: Self-pay | Admitting: Physical Medicine and Rehabilitation

## 2021-01-06 ENCOUNTER — Other Ambulatory Visit: Payer: Self-pay

## 2021-01-06 DIAGNOSIS — F4325 Adjustment disorder with mixed disturbance of emotions and conduct: Secondary | ICD-10-CM | POA: Diagnosis not present

## 2021-01-06 DIAGNOSIS — M5481 Occipital neuralgia: Secondary | ICD-10-CM | POA: Diagnosis not present

## 2021-01-06 HISTORY — PX: OTHER SURGICAL HISTORY: SHX169

## 2021-01-06 NOTE — Telephone Encounter (Signed)
Please advise 

## 2021-01-06 NOTE — Progress Notes (Signed)
Nerve block (w/o) steroid: Pt signed consent  0.5% Bupivocaine 51mL  LOT: [id number] EXP: 09/22 Robinson: 93818-299-37   2% Lidocaine  79mL  LOT: [id number] EXP: 10/2021 NDC: 10175-102-58

## 2021-01-06 NOTE — Telephone Encounter (Signed)
Spoke with the patient and discussed she has seen Dr Ernestina Patches for injections now for this.  Pt stated she has only seen Dr.  Ernestina Patches once. She does not see him on an emergent basis but stated Dr Jaynee Eagles has said she can call here for nerve blocks if needed. She stated this started with a fall on Sunday and then the spasms started yesterday afternoon. Pt stated she called and spoke with Dr Krista Blue after hours last night. She increased her Gabapentin as advised. She said there is no sense in going to the ER as she knows what this pain is and medications have not helped in 20 years.

## 2021-01-06 NOTE — Telephone Encounter (Signed)
Left message to advise. FYI- patient called neurologist today- they have her scheduled for this afternoon for occipital nerve block, but they advised that you are the physician who now treats her for this condition.

## 2021-01-06 NOTE — Telephone Encounter (Signed)
Pt called, had spoke with Dr. Krista Blue yesterday, she said she would leave a message. Having pain from the occipital nerve all way up my head. Want to know if I can be seen to get nerve block. Would like a call from the nurse.

## 2021-01-06 NOTE — Telephone Encounter (Signed)
Patient called needing to schedule an appointment with Dr Ernestina Patches for her neck.  Patient fell 01/02/2021 and hit right side of head on concrete. The number to contact patient is [phone]

## 2021-01-06 NOTE — Telephone Encounter (Signed)
Spoke with patient and scheduled her for occipital nerve block today at 2:30 PM with Amy NP.  Patient verbalized appreciation.

## 2021-01-06 NOTE — Progress Notes (Addendum)
     History: Cassidy Olsen has a history of occipital neuralgia and recently referred to Dr Ernestina Patches for consideration of RFA. She fell two days ago hitting the right side of her head. No obvious injuries and no red flag warnings, however, she reports increased occipital pain on the right side. She called on call provider and was advised to increase gabapentin to 600mg  TID (currently only taking BID). She has current prescription for 600mg  TID and will continue for the next week, then may decrease dose to 600mg  BID if pain has resolved. She was encouraged to call Dr Ernestina Patches to discuss next steps in treatment of chronic occipital pain.     Bupivicaine/lidocaine protocol for occipital neuralgia  Bupivacaine 0.5% and Lidocaine 2% 1:1 mixture was injected on into right scalp at several locations:  -On the occipital area of the head, 3 injections, 1 cc per injection at the midpoint between the mastoid process and the occipital protuberance. 2 other injections were done one finger breadth from the initial injection, one at a 10 o'clock position and the other at a 2 o'clock position.  The patient tolerated the injections well, no complications of the procedure were noted. Injections were made with a 27-gauge needle.  agree with procedure   Sarina Ill, MD [facility]

## 2021-01-06 NOTE — Telephone Encounter (Signed)
She might want urgent care or ED if hit head on concrete or neurologist?

## 2021-01-08 ENCOUNTER — Other Ambulatory Visit (HOSPITAL_COMMUNITY): Payer: Medicare Other

## 2021-01-12 ENCOUNTER — Ambulatory Visit: Admit: 2021-01-12 | Payer: Medicare Other | Admitting: Surgery

## 2021-01-12 SURGERY — LAPAROSCOPIC CHOLECYSTECTOMY
Anesthesia: General

## 2021-01-14 ENCOUNTER — Telehealth: Payer: Self-pay | Admitting: Neurology

## 2021-01-14 DIAGNOSIS — M792 Neuralgia and neuritis, unspecified: Secondary | ICD-10-CM | POA: Diagnosis not present

## 2021-01-14 DIAGNOSIS — G894 Chronic pain syndrome: Secondary | ICD-10-CM | POA: Diagnosis not present

## 2021-01-14 DIAGNOSIS — G500 Trigeminal neuralgia: Secondary | ICD-10-CM | POA: Diagnosis not present

## 2021-01-14 DIAGNOSIS — M961 Postlaminectomy syndrome, not elsewhere classified: Secondary | ICD-10-CM | POA: Diagnosis not present

## 2021-01-14 NOTE — Telephone Encounter (Signed)
I called the patient back to let her know that we could send a referral for the trigeminal neuralgia.  She stated that she had actually already seeing the doctor today and he got the notes and they have a plan in place. She does not believe we need to send a referral after all.

## 2021-01-14 NOTE — Telephone Encounter (Signed)
Pt called, asking if a note or referral can be sent to Dr. Fonnie Jarvis, [place] in [place], phone number: [phone]. So he can treat me for Trigeminal Neuralgia. Would like a call from the nurse.

## 2021-01-19 ENCOUNTER — Other Ambulatory Visit: Payer: Self-pay | Admitting: Family Medicine

## 2021-01-19 DIAGNOSIS — Z1231 Encounter for screening mammogram for malignant neoplasm of breast: Secondary | ICD-10-CM

## 2021-01-20 DIAGNOSIS — G8929 Other chronic pain: Secondary | ICD-10-CM | POA: Diagnosis not present

## 2021-01-20 DIAGNOSIS — M961 Postlaminectomy syndrome, not elsewhere classified: Secondary | ICD-10-CM | POA: Diagnosis not present

## 2021-01-20 DIAGNOSIS — M5416 Radiculopathy, lumbar region: Secondary | ICD-10-CM | POA: Diagnosis not present

## 2021-01-20 DIAGNOSIS — M5410 Radiculopathy, site unspecified: Secondary | ICD-10-CM | POA: Diagnosis not present

## 2021-01-25 ENCOUNTER — Encounter: Payer: Self-pay | Admitting: Neurology

## 2021-01-25 ENCOUNTER — Ambulatory Visit: Payer: Self-pay | Admitting: Neurology

## 2021-01-27 DIAGNOSIS — Z23 Encounter for immunization: Secondary | ICD-10-CM | POA: Diagnosis not present

## 2021-01-28 ENCOUNTER — Ambulatory Visit (INDEPENDENT_AMBULATORY_CARE_PROVIDER_SITE_OTHER): Payer: Medicare Other | Admitting: Podiatry

## 2021-01-28 ENCOUNTER — Telehealth: Payer: Self-pay

## 2021-01-28 ENCOUNTER — Other Ambulatory Visit: Payer: Self-pay | Admitting: Podiatry

## 2021-01-28 ENCOUNTER — Other Ambulatory Visit: Payer: Self-pay

## 2021-01-28 ENCOUNTER — Ambulatory Visit (INDEPENDENT_AMBULATORY_CARE_PROVIDER_SITE_OTHER): Payer: Medicare Other

## 2021-01-28 DIAGNOSIS — M778 Other enthesopathies, not elsewhere classified: Secondary | ICD-10-CM | POA: Diagnosis not present

## 2021-01-28 DIAGNOSIS — M79671 Pain in right foot: Secondary | ICD-10-CM

## 2021-01-28 DIAGNOSIS — F4325 Adjustment disorder with mixed disturbance of emotions and conduct: Secondary | ICD-10-CM | POA: Diagnosis not present

## 2021-01-28 MED ORDER — TRIAMCINOLONE ACETONIDE 10 MG/ML IJ SUSP
10.0000 mg | Freq: Once | INTRAMUSCULAR | Status: AC
  Administered 2021-01-28: 10 mg

## 2021-01-28 MED ORDER — METHYLPREDNISOLONE 4 MG PO TBPK: ORAL_TABLET | ORAL | 0 refills | Status: DC

## 2021-01-28 NOTE — Telephone Encounter (Signed)
Pt called in response to question regarding resting her foot. Pt advised some rest would be okay and may reduce her pain. Pt states understanding and satisfaction with explanation.

## 2021-01-29 NOTE — Progress Notes (Signed)
Subjective:   Patient ID: Cassidy Olsen, female   DOB: 60 y.o.   MRN: [id number]   HPI Patient presents stating she is developed severe pain in her right forefoot that started in the last several weeks and she does not remember specific injury.  Does have problems with her back and gets blocks patient does not smoke likes to be active   Review of Systems  All other systems reviewed and are negative.      Objective:  Physical Exam Vitals and nursing note reviewed.  Constitutional:      Appearance: She is well-developed.  Pulmonary:     Effort: Pulmonary effort is normal.  Musculoskeletal:        General: Normal range of motion.  Skin:    General: Skin is warm.  Neurological:     Mental Status: She is alert.    Neurovascular status intact muscle strength adequate range of motion within normal limits with exquisite forefoot pain right centered on the second metatarsal phalangeal joint with fluid buildup within the joint and patient was noted to have good digital perfusion well oriented x3     Assessment:  Inflammatory capsulitis second MPJ right with pain     Plan:  H&P reviewed condition today I went ahead and I did a forefoot block right I aspirated the second MPJ getting out a small amount of clear fluid I injected quarter cc dexamethasone Kenalog applied padding discussed rigid bottom shoes reappoint to recheck  X-rays indicate that there is no indications of fracture or any bony pathology associated with this condition

## 2021-02-01 DIAGNOSIS — M810 Age-related osteoporosis without current pathological fracture: Secondary | ICD-10-CM | POA: Diagnosis not present

## 2021-02-01 DIAGNOSIS — Z981 Arthrodesis status: Secondary | ICD-10-CM | POA: Diagnosis not present

## 2021-02-01 DIAGNOSIS — Z9682 Presence of neurostimulator: Secondary | ICD-10-CM | POA: Diagnosis not present

## 2021-02-01 DIAGNOSIS — Z91040 Latex allergy status: Secondary | ICD-10-CM | POA: Diagnosis not present

## 2021-02-01 DIAGNOSIS — Z880 Allergy status to penicillin: Secondary | ICD-10-CM | POA: Diagnosis not present

## 2021-02-01 DIAGNOSIS — Z96651 Presence of right artificial knee joint: Secondary | ICD-10-CM | POA: Diagnosis not present

## 2021-02-01 DIAGNOSIS — Z888 Allergy status to other drugs, medicaments and biological substances status: Secondary | ICD-10-CM | POA: Diagnosis not present

## 2021-02-01 DIAGNOSIS — G501 Atypical facial pain: Secondary | ICD-10-CM | POA: Diagnosis not present

## 2021-02-01 DIAGNOSIS — M1990 Unspecified osteoarthritis, unspecified site: Secondary | ICD-10-CM | POA: Diagnosis not present

## 2021-02-01 DIAGNOSIS — Z886 Allergy status to analgesic agent status: Secondary | ICD-10-CM | POA: Diagnosis not present

## 2021-02-01 DIAGNOSIS — Z885 Allergy status to narcotic agent status: Secondary | ICD-10-CM | POA: Diagnosis not present

## 2021-02-01 DIAGNOSIS — Z8739 Personal history of other diseases of the musculoskeletal system and connective tissue: Secondary | ICD-10-CM | POA: Diagnosis not present

## 2021-02-02 DIAGNOSIS — Z20822 Contact with and (suspected) exposure to covid-19: Secondary | ICD-10-CM | POA: Diagnosis not present

## 2021-02-05 DIAGNOSIS — F4325 Adjustment disorder with mixed disturbance of emotions and conduct: Secondary | ICD-10-CM | POA: Diagnosis not present

## 2021-02-07 ENCOUNTER — Other Ambulatory Visit: Payer: Self-pay

## 2021-02-07 ENCOUNTER — Emergency Department (HOSPITAL_COMMUNITY)
Admission: EM | Admit: 2021-02-07 | Discharge: 2021-02-07 | Disposition: A | Discharge status: Home / Self Care | Payer: No Typology Code available for payment source | Attending: Emergency Medicine | Admitting: Emergency Medicine

## 2021-02-07 ENCOUNTER — Encounter (HOSPITAL_COMMUNITY): Payer: Self-pay | Admitting: Emergency Medicine

## 2021-02-07 ENCOUNTER — Ambulatory Visit (HOSPITAL_COMMUNITY)
Admission: EM | Admit: 2021-02-07 | Discharge: 2021-02-07 | Disposition: A | Discharge status: Home / Self Care | Payer: Medicare Other

## 2021-02-07 DIAGNOSIS — Z91040 Latex allergy status: Secondary | ICD-10-CM | POA: Insufficient documentation

## 2021-02-07 DIAGNOSIS — M62830 Muscle spasm of back: Secondary | ICD-10-CM | POA: Diagnosis not present

## 2021-02-07 DIAGNOSIS — Z7952 Long term (current) use of systemic steroids: Secondary | ICD-10-CM | POA: Insufficient documentation

## 2021-02-07 DIAGNOSIS — J45909 Unspecified asthma, uncomplicated: Secondary | ICD-10-CM | POA: Insufficient documentation

## 2021-02-07 DIAGNOSIS — I10 Essential (primary) hypertension: Secondary | ICD-10-CM | POA: Insufficient documentation

## 2021-02-07 DIAGNOSIS — G501 Atypical facial pain: Secondary | ICD-10-CM | POA: Diagnosis not present

## 2021-02-07 DIAGNOSIS — R519 Headache, unspecified: Secondary | ICD-10-CM | POA: Diagnosis not present

## 2021-02-07 DIAGNOSIS — Z96651 Presence of right artificial knee joint: Secondary | ICD-10-CM | POA: Insufficient documentation

## 2021-02-07 DIAGNOSIS — Y92410 Unspecified street and highway as the place of occurrence of the external cause: Secondary | ICD-10-CM | POA: Insufficient documentation

## 2021-02-07 DIAGNOSIS — Z751 Person awaiting admission to adequate facility elsewhere: Secondary | ICD-10-CM

## 2021-02-07 NOTE — ED Notes (Addendum)
Pt waiting (per her request) for registration to update her information and then she is ready to be discharged.
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
## 2021-02-07 NOTE — ED Notes (Signed)
Patient is being discharged from the Urgent Care and sent to the Emergency Department via POV . Per Marin Roberts PA-C, patient is in need of higher level of care due to right side numbness, tingling, and pain . Patient is aware and verbalizes understanding of plan of care. There were no vitals filed for this visit.
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
## 2021-02-11 DIAGNOSIS — Z79899 Other long term (current) drug therapy: Secondary | ICD-10-CM | POA: Diagnosis not present

## 2021-02-11 DIAGNOSIS — Z5181 Encounter for therapeutic drug level monitoring: Secondary | ICD-10-CM | POA: Diagnosis not present

## 2021-02-15 ENCOUNTER — Other Ambulatory Visit: Payer: Self-pay | Admitting: *Deleted

## 2021-02-15 MED ORDER — AMITRIPTYLINE HCL 50 MG PO TABS: 50.0000 mg | ORAL_TABLET | Freq: Every day | ORAL | 3 refills | Status: DC

## 2021-02-15 NOTE — Progress Notes (Signed)
Took call from phone staff and spoke w/ Vicente Males w/ [id number] - [place], [place]. Pt transferred rx's to them but amitriptyline did not transfer, needs refill. I e-scribed refill as requested.

## 2021-02-17 DIAGNOSIS — M5481 Occipital neuralgia: Secondary | ICD-10-CM | POA: Diagnosis not present

## 2021-02-17 DIAGNOSIS — Z888 Allergy status to other drugs, medicaments and biological substances status: Secondary | ICD-10-CM | POA: Diagnosis not present

## 2021-02-17 DIAGNOSIS — K589 Irritable bowel syndrome without diarrhea: Secondary | ICD-10-CM | POA: Diagnosis not present

## 2021-02-17 DIAGNOSIS — Z885 Allergy status to narcotic agent status: Secondary | ICD-10-CM | POA: Diagnosis not present

## 2021-02-17 DIAGNOSIS — Z880 Allergy status to penicillin: Secondary | ICD-10-CM | POA: Diagnosis not present

## 2021-02-17 DIAGNOSIS — Z981 Arthrodesis status: Secondary | ICD-10-CM | POA: Diagnosis not present

## 2021-02-17 DIAGNOSIS — E785 Hyperlipidemia, unspecified: Secondary | ICD-10-CM | POA: Diagnosis not present

## 2021-02-17 DIAGNOSIS — Z7982 Long term (current) use of aspirin: Secondary | ICD-10-CM | POA: Diagnosis not present

## 2021-02-17 DIAGNOSIS — G629 Polyneuropathy, unspecified: Secondary | ICD-10-CM | POA: Diagnosis not present

## 2021-02-17 DIAGNOSIS — Z79899 Other long term (current) drug therapy: Secondary | ICD-10-CM | POA: Diagnosis not present

## 2021-02-17 DIAGNOSIS — Z91040 Latex allergy status: Secondary | ICD-10-CM | POA: Diagnosis not present

## 2021-02-17 DIAGNOSIS — G501 Atypical facial pain: Secondary | ICD-10-CM | POA: Diagnosis not present

## 2021-02-17 DIAGNOSIS — Z886 Allergy status to analgesic agent status: Secondary | ICD-10-CM | POA: Diagnosis not present

## 2021-02-17 DIAGNOSIS — Z9660 Presence of unspecified orthopedic joint implant: Secondary | ICD-10-CM | POA: Diagnosis not present

## 2021-02-17 DIAGNOSIS — K219 Gastro-esophageal reflux disease without esophagitis: Secondary | ICD-10-CM | POA: Diagnosis not present

## 2021-02-23 ENCOUNTER — Other Ambulatory Visit: Payer: Self-pay | Admitting: Neurology

## 2021-03-03 ENCOUNTER — Other Ambulatory Visit: Payer: Self-pay | Admitting: Chiropractic Medicine

## 2021-03-03 DIAGNOSIS — M542 Cervicalgia: Secondary | ICD-10-CM

## 2021-03-04 ENCOUNTER — Ambulatory Visit: Payer: Medicare Other | Admitting: Family Medicine

## 2021-03-08 DIAGNOSIS — M5481 Occipital neuralgia: Secondary | ICD-10-CM | POA: Diagnosis not present

## 2021-03-12 ENCOUNTER — Ambulatory Visit: Payer: Medicare Other
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
## 2021-03-18 ENCOUNTER — Ambulatory Visit: Payer: Medicare Other

## 2021-03-18 ENCOUNTER — Telehealth: Payer: Self-pay | Admitting: Family Medicine

## 2021-03-18 ENCOUNTER — Ambulatory Visit (INDEPENDENT_AMBULATORY_CARE_PROVIDER_SITE_OTHER): Payer: Medicare Other | Admitting: Family Medicine

## 2021-03-18 DIAGNOSIS — G43709 Chronic migraine without aura, not intractable, without status migrainosus: Secondary | ICD-10-CM | POA: Diagnosis not present

## 2021-03-18 DIAGNOSIS — F4325 Adjustment disorder with mixed disturbance of emotions and conduct: Secondary | ICD-10-CM | POA: Diagnosis not present

## 2021-03-18 NOTE — Progress Notes (Signed)
Botox- 200 units  Lot: [id number] Expiration: 08/2023 NDC: [id number]  Bacteriostatic 0.9% Sodium Chloride- 81m total Lot: [id number]: 08/08/2022 NDC: [id number] Dx: [id number]

## 2021-03-24 ENCOUNTER — Ambulatory Visit: Payer: Medicare Other

## 2021-03-30 ENCOUNTER — Encounter: Payer: Self-pay | Admitting: Neurology

## 2021-03-30 ENCOUNTER — Ambulatory Visit (INDEPENDENT_AMBULATORY_CARE_PROVIDER_SITE_OTHER): Payer: Medicare Other | Admitting: Neurology

## 2021-03-30 VITALS — BP 143/89 | HR 103 | Ht 62.0 in | Wt 178.4 lb

## 2021-03-30 DIAGNOSIS — G43711 Chronic migraine without aura, intractable, with status migrainosus: Secondary | ICD-10-CM

## 2021-03-30 DIAGNOSIS — M5481 Occipital neuralgia: Secondary | ICD-10-CM

## 2021-03-30 DIAGNOSIS — G500 Trigeminal neuralgia: Secondary | ICD-10-CM

## 2021-03-30 MED ORDER — UBRELVY 100 MG PO TABS: 100.0000 mg | ORAL_TABLET | ORAL | 11 refills | Status: DC | PRN

## 2021-03-30 MED ORDER — AJOVY 225 MG/1.5ML ~~LOC~~ SOAJ: 225.0000 mg | SUBCUTANEOUS | 11 refills | Status: DC
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
## 2021-03-31 ENCOUNTER — Ambulatory Visit: Payer: Medicare Other | Admitting: Neurology

## 2021-04-01 ENCOUNTER — Ambulatory Visit: Payer: Medicare Other

## 2021-04-01 ENCOUNTER — Telehealth: Payer: Self-pay | Admitting: *Deleted

## 2021-04-01 MED ORDER — UBRELVY 100 MG PO TABS: ORAL_TABLET | ORAL | 0 refills | Status: DC

## 2021-04-01 NOTE — Addendum Note (Signed)
Addended by: Skeet Simmer A on: 04/01/2021 11:27 AM   Modules accepted: Orders

## 2021-04-01 NOTE — Telephone Encounter (Signed)
Per Dr Jaynee Eagles called patient to inform her Cassidy Olsen samples are at the front desk for her

## 2021-04-06 DIAGNOSIS — F4325 Adjustment disorder with mixed disturbance of emotions and conduct: Secondary | ICD-10-CM | POA: Diagnosis not present

## 2021-04-08 DIAGNOSIS — F4325 Adjustment disorder with mixed disturbance of emotions and conduct: Secondary | ICD-10-CM | POA: Diagnosis not present

## 2021-04-22 ENCOUNTER — Other Ambulatory Visit: Payer: Self-pay | Admitting: Obstetrics and Gynecology

## 2021-04-22 DIAGNOSIS — Z1231 Encounter for screening mammogram for malignant neoplasm of breast: Secondary | ICD-10-CM

## 2021-04-23 ENCOUNTER — Ambulatory Visit: Payer: Medicare Other

## 2021-04-25 ENCOUNTER — Encounter: Payer: Self-pay | Admitting: Physical Medicine and Rehabilitation

## 2021-04-26 ENCOUNTER — Ambulatory Visit
Admission: RE | Admit: 2021-04-26 | Discharge: 2021-04-26 | Disposition: A | Discharge status: Home / Self Care | Payer: Medicare Other | Source: Ambulatory Visit | Attending: Obstetrics and Gynecology | Admitting: Obstetrics and Gynecology

## 2021-04-26 ENCOUNTER — Other Ambulatory Visit: Payer: Self-pay

## 2021-04-26 DIAGNOSIS — Z7251 High risk heterosexual behavior: Secondary | ICD-10-CM | POA: Diagnosis not present

## 2021-04-26 DIAGNOSIS — N952 Postmenopausal atrophic vaginitis: Secondary | ICD-10-CM | POA: Diagnosis not present

## 2021-04-26 DIAGNOSIS — Z9189 Other specified personal risk factors, not elsewhere classified: Secondary | ICD-10-CM | POA: Diagnosis not present

## 2021-04-26 DIAGNOSIS — R102 Pelvic and perineal pain: Secondary | ICD-10-CM | POA: Diagnosis not present

## 2021-04-26 DIAGNOSIS — N898 Other specified noninflammatory disorders of vagina: Secondary | ICD-10-CM | POA: Diagnosis not present

## 2021-04-26 DIAGNOSIS — Z1231 Encounter for screening mammogram for malignant neoplasm of breast: Secondary | ICD-10-CM

## 2021-04-26 DIAGNOSIS — Z803 Family history of malignant neoplasm of breast: Secondary | ICD-10-CM | POA: Diagnosis not present

## 2021-04-27 ENCOUNTER — Other Ambulatory Visit: Payer: Self-pay | Admitting: Nurse Practitioner

## 2021-04-27 DIAGNOSIS — M5481 Occipital neuralgia: Secondary | ICD-10-CM | POA: Diagnosis not present

## 2021-04-27 DIAGNOSIS — Z9689 Presence of other specified functional implants: Secondary | ICD-10-CM | POA: Diagnosis not present

## 2021-04-27 DIAGNOSIS — M792 Neuralgia and neuritis, unspecified: Secondary | ICD-10-CM | POA: Diagnosis not present

## 2021-04-27 DIAGNOSIS — G500 Trigeminal neuralgia: Secondary | ICD-10-CM | POA: Diagnosis not present

## 2021-04-27 DIAGNOSIS — M961 Postlaminectomy syndrome, not elsewhere classified: Secondary | ICD-10-CM | POA: Diagnosis not present

## 2021-04-27 DIAGNOSIS — G894 Chronic pain syndrome: Secondary | ICD-10-CM | POA: Diagnosis not present

## 2021-04-27 DIAGNOSIS — Z803 Family history of malignant neoplasm of breast: Secondary | ICD-10-CM

## 2021-05-12 ENCOUNTER — Other Ambulatory Visit: Payer: Self-pay

## 2021-05-12 ENCOUNTER — Other Ambulatory Visit: Payer: Self-pay | Admitting: Gastroenterology

## 2021-05-12 ENCOUNTER — Telehealth: Payer: Self-pay | Admitting: Gastroenterology

## 2021-05-12 DIAGNOSIS — K5904 Chronic idiopathic constipation: Secondary | ICD-10-CM

## 2021-05-12 MED ORDER — DICYCLOMINE HCL 10 MG PO CAPS: ORAL_CAPSULE | ORAL | 0 refills | Status: DC

## 2021-05-12 MED ORDER — LUBIPROSTONE 24 MCG PO CAPS: 24.0000 ug | ORAL_CAPSULE | Freq: Two times a day (BID) | ORAL | 0 refills | Status: DC

## 2021-05-12 NOTE — Telephone Encounter (Signed)
Rx sent to pharmacy as requested.

## 2021-05-12 NOTE — Telephone Encounter (Signed)
Pt called stating she needed refills of her medications.  Please advise.

## 2021-05-13 DIAGNOSIS — F4325 Adjustment disorder with mixed disturbance of emotions and conduct: Secondary | ICD-10-CM | POA: Diagnosis not present

## 2021-05-13 DIAGNOSIS — E7800 Pure hypercholesterolemia, unspecified: Secondary | ICD-10-CM | POA: Diagnosis not present

## 2021-05-13 DIAGNOSIS — R635 Abnormal weight gain: Secondary | ICD-10-CM | POA: Diagnosis not present

## 2021-05-13 DIAGNOSIS — I10 Essential (primary) hypertension: Secondary | ICD-10-CM | POA: Diagnosis not present

## 2021-05-13 DIAGNOSIS — Z566 Other physical and mental strain related to work: Secondary | ICD-10-CM | POA: Diagnosis not present

## 2021-05-13 DIAGNOSIS — R7303 Prediabetes: Secondary | ICD-10-CM | POA: Diagnosis not present

## 2021-05-13 DIAGNOSIS — R079 Chest pain, unspecified: Secondary | ICD-10-CM | POA: Diagnosis not present

## 2021-05-13 DIAGNOSIS — R000 Tachycardia, unspecified: Secondary | ICD-10-CM | POA: Diagnosis not present

## 2021-05-13 DIAGNOSIS — F331 Major depressive disorder, recurrent, moderate: Secondary | ICD-10-CM | POA: Diagnosis not present

## 2021-05-17 ENCOUNTER — Telehealth: Payer: Self-pay | Admitting: Gastroenterology

## 2021-05-17 DIAGNOSIS — I10 Essential (primary) hypertension: Secondary | ICD-10-CM | POA: Diagnosis not present

## 2021-05-17 DIAGNOSIS — K3189 Other diseases of stomach and duodenum: Secondary | ICD-10-CM | POA: Diagnosis not present

## 2021-05-17 DIAGNOSIS — K581 Irritable bowel syndrome with constipation: Secondary | ICD-10-CM | POA: Diagnosis not present

## 2021-05-17 NOTE — Telephone Encounter (Signed)
Returned patient's call and she states she has sever constant mid-abd. pain, which is made worse with any liquid or solid intake. She has been taking the Bentyl prn with only relief for about 1 hr. after. I asked her to take it scheduled doses, 20mg  Q8hrs. Also moved her office visit up to 06/15/21. Please advise any other interventions in the meantime.

## 2021-05-17 NOTE — Telephone Encounter (Signed)
Agree, thanks

## 2021-05-17 NOTE — Telephone Encounter (Signed)
Pt called today stating she's been in severe pain x 3 days with the medication prescribed providing little to no relief.  She has appt scheduled for latter part of November. Please call to advise patient what she should do in the interim.  Thank you.

## 2021-05-24 ENCOUNTER — Ambulatory Visit: Payer: Medicare Other | Admitting: Nurse Practitioner

## 2021-05-27 DIAGNOSIS — F4325 Adjustment disorder with mixed disturbance of emotions and conduct: Secondary | ICD-10-CM | POA: Diagnosis not present

## 2021-05-31 ENCOUNTER — Other Ambulatory Visit: Payer: Self-pay | Admitting: Gastroenterology

## 2021-06-07 DIAGNOSIS — J069 Acute upper respiratory infection, unspecified: Secondary | ICD-10-CM | POA: Diagnosis not present

## 2021-06-10 DIAGNOSIS — F4325 Adjustment disorder with mixed disturbance of emotions and conduct: Secondary | ICD-10-CM | POA: Diagnosis not present

## 2021-06-15 ENCOUNTER — Ambulatory Visit (INDEPENDENT_AMBULATORY_CARE_PROVIDER_SITE_OTHER): Payer: Medicare Other | Admitting: Gastroenterology

## 2021-06-15 ENCOUNTER — Encounter: Payer: Self-pay | Admitting: Gastroenterology

## 2021-06-15 VITALS — BP 114/78 | HR 114 | Ht 62.5 in | Wt 174.5 lb

## 2021-06-15 DIAGNOSIS — K5904 Chronic idiopathic constipation: Secondary | ICD-10-CM | POA: Diagnosis not present

## 2021-06-15 DIAGNOSIS — K219 Gastro-esophageal reflux disease without esophagitis: Secondary | ICD-10-CM

## 2021-06-15 DIAGNOSIS — R1011 Right upper quadrant pain: Secondary | ICD-10-CM

## 2021-06-15 DIAGNOSIS — K582 Mixed irritable bowel syndrome: Secondary | ICD-10-CM

## 2021-06-15 DIAGNOSIS — R1013 Epigastric pain: Secondary | ICD-10-CM

## 2021-06-15 DIAGNOSIS — R109 Unspecified abdominal pain: Secondary | ICD-10-CM

## 2021-06-15 MED ORDER — SUCRALFATE 1 G PO TABS: 1.0000 g | ORAL_TABLET | Freq: Two times a day (BID) | ORAL | 1 refills | Status: DC

## 2021-06-15 MED ORDER — LUBIPROSTONE 24 MCG PO CAPS: 24.0000 ug | ORAL_CAPSULE | Freq: Two times a day (BID) | ORAL | 3 refills | Status: DC

## 2021-06-15 MED ORDER — DEXLANSOPRAZOLE 60 MG PO CPDR: 60.0000 mg | DELAYED_RELEASE_CAPSULE | Freq: Every day | ORAL | 11 refills | Status: DC

## 2021-06-15 MED ORDER — DICYCLOMINE HCL 10 MG PO CAPS: ORAL_CAPSULE | ORAL | 3 refills | Status: DC

## 2021-06-15 NOTE — Progress Notes (Signed)
Cassidy Olsen    [id number]    06-04-1961  Primary Care Physician:Robinson, Blanche East, MD  Referring Physician: Forrest Moron, MD [phone] [place],  [place] [address]   Chief complaint:  Diarrhea  HPI:  60 year old very pleasant female here for follow up visit for GERD and IBS with constipation and diarrhea  Dicylomine helps with intermittent abdominal discomfort and cramping  Upper quadrant abdominal pain improved s/p cholecystectomy, she continues to have intermittent discomfort  He has intermittent upper abdominal discomfort/burning sensation, feels like something that is scratchy scratchy or scrapping  She is no longer using Metamucil or MiraLAX.  She is taking Amitiza 24 mcg twice daily, she does have sensation of incomplete evacuation and also has occasional diarrhea.  Denies any relationship with diet   GERD symptoms stable on Dexilant 60 mg daily    She was previously followed by [facility] [Dr. Schooler]   Colonoscopy February 2018, small adenomatous colon polyp removed and medium-sized hemorrhoids   EGD February 2018 showed hiatal hernia and gastritis.  Biopsies negative for H. pylori.  Duodenal biopsies negative for celiac.   Patient had work-up in [place], HIDA scan and small bowel series insert negative for any significant pathology   Sister has celiac disease and her daughter has gluten sensitivity.  She had negative celiac testing, TTG IgA antibody was undetectable   S/p kidney stone removal 09/2019, has bilateral multiple small kidney stones was told it is residual fragments and she will eventually passed them     Abdominal ultrasound Jan 02, 2020: 1. Gallstones without sonographic evidence for acute cholecystitis or biliary dilatation 2. Bilateral kidney stones without hydronephrosis 3. Complicated cyst upper pole left kidney measuring 1.3 cm, could be more thoroughly characterized by MRI.   CT abdomen and  pelvis with contrast December 2020 1. Obstructing 8 x 6 mm calculus at the left ureteropelvic junction resulting in mild-to-moderate left hydronephrosis with small volume perinephric fluid and stranding. Extensive left-sided urothelial enhancement and stranding raises suspicion for a superimposed infectious or inflammatory process. 2. Thickened appearance of the urinary bladder, particularly along its superior margin. Recommend correlation with urinalysis. 3. Multiple nonobstructing right renal calculi, largest measuring up to 9 mm. 4. Cholelithiasis without evidence for cholecystitis. 5. Previously seen left adnexal cyst has slightly decreased in size.   Outpatient Encounter Medications as of 06/15/2021  Medication Sig   ACETAMINOPHEN PO Take 650 mg by mouth every 8 (eight) hours as needed for moderate pain.   albuterol (VENTOLIN HFA) 108 (90 Base) MCG/ACT inhaler Inhale 2 puffs into the lungs every 6 (six) hours as needed for wheezing or shortness of breath.   AMBULATORY NON FORMULARY MEDICATION 1 tablet 2 (two) times daily. Medication Name: Migrelief   amitriptyline (ELAVIL) 50 MG tablet Take 1 tablet (50 mg total) by mouth at bedtime.   amLODipine (NORVASC) 10 MG tablet Take 10 mg by mouth at bedtime.   budesonide-formoterol (SYMBICORT) 80-4.5 MCG/ACT inhaler Inhale 2 puffs into the lungs 2 (two) times daily as needed (asthma).   celecoxib (CELEBREX) 200 MG capsule Take 200 mg by mouth 2 (two) times daily.   dexlansoprazole (DEXILANT) 60 MG capsule Take 60 mg by mouth daily.   fenofibrate (TRICOR) 145 MG tablet Take 1 tablet (145 mg total) by mouth daily.   fluticasone (FLONASE) 50 MCG/ACT nasal spray Place 2 sprays into both nostrils daily.   Fremanezumab-vfrm (AJOVY) 225 MG/1.5ML SOAJ  Inject 225 mg into the skin every 30 (thirty) days.   gabapentin (NEURONTIN) 300 MG capsule Take 2 capsules (600 mg total) by mouth 3 (three) times daily.   lubiprostone (AMITIZA) 24 MCG capsule TAKE 1  CAPSULE (24 MCG TOTAL) BY MOUTH 2 (TWO) TIMES DAILY WITH A MEAL.   magnesium oxide (MAG-OX) 400 MG tablet Take 400 mg by mouth daily.   montelukast (SINGULAIR) 10 MG tablet Take 10 mg by mouth at bedtime.   Probiotic Product (PROBIOTIC DAILY PO) Take 1 capsule by mouth daily.   Spacer/Aero-Holding Chambers (AEROCHAMBER MV) inhaler Use as instructed   Ubrogepant (UBRELVY) 100 MG TABS Take one onset may repeat in 2 hours if needed  Max 2 per 24 hours  NDC - 8756-4332-95 Lot -[id number] Exp-07/2021   valACYclovir (VALTREX) 1000 MG tablet Take 1 tablet (1,000 mg total) by mouth 3 (three) times daily. (Patient taking differently: Take 1,000 mg by mouth 3 (three) times daily as needed (outbreaks).)   valACYclovir (VALTREX) 500 MG tablet Take 500 mg by mouth 2 (two) times daily as needed (outbreaks).    dicyclomine (BENTYL) 10 MG capsule TAKE 1 TO 2 CAPSULES BY MOUTH EVERY 8 HOURS AS NEEDED (Patient not taking: Reported on 06/15/2021)   Ubrogepant (UBRELVY) 100 MG TABS Take 100 mg by mouth every 2 (two) hours as needed. Maximum 265m a day. (Patient not taking: Reported on 06/15/2021)   [DISCONTINUED] methocarbamol (ROBAXIN) 500 MG tablet Take 250-500 mg by mouth at bedtime.   No facility-administered encounter medications on file as of 06/15/2021.    Allergies as of 06/15/2021 - Review Complete 06/15/2021  Allergen Reaction Noted   Aspirin Other (See Comments), Nausea And Vomiting, and Nausea Only 04/18/2013   Ketorolac tromethamine Shortness Of Breath, Itching, and Other (See Comments) 08/14/2019   Penicillins Hives, Itching, Rash, and Other (See Comments) 03/19/1994   Codeine Other (See Comments) 07/17/2018   Ibuprofen Other (See Comments) and Nausea And Vomiting 09/03/2016   Latex Rash and Other (See Comments) 03/07/2011   Omeprazole magnesium Other (See Comments) 12/25/2019   Pantoprazole sodium Other (See Comments) 12/25/2019   Penicillamine Rash 12/26/2019   Pregabalin Other (See Comments)  12/25/2019    Past Medical History:  Diagnosis Date   Arthritis 2010   Asthma    Chronic headaches    Chronic pain    Family history of breast cancer    Family history of prostate cancer    Gallstones 2018   GERD (gastroesophageal reflux disease)    High cholesterol    History of kidney stones    Hx: UTI (urinary tract infection)    Hypertension    IBS (irritable bowel syndrome)    Interstitial cystitis 2012   Migraine    Occipital neuralgia    Osteoarthritis    PNA (pneumonia) 2018   Pre-diabetes    Spinal stenosis    Trigeminal neuralgia    Trigeminal neuralgia     Past Surgical History:  Procedure Laterality Date   ABDOMINAL HYSTERECTOMY     ANKLE SURGERY Left 05/2009   APPENDECTOMY     BACK SURGERY     Benign Tumor Removal Right 01/26/2017   right upper arm [place] [facility]   CHOLECYSTECTOMY N/A 12/24/2020   Procedure: LAPAROSCOPIC CHOLECYSTECTOMY;  Surgeon: BCoralie Keens MD;  [facility]  Service: General;  Laterality: N/A;   CYSTOSCOPY W/ URETERAL STENT PLACEMENT Left  07/25/2019   Procedure: CYSTOSCOPY WITH RETROGRADE PYELOGRAM/URETERAL STENT PLACEMENT;  Surgeon: Franchot Gallo, MD;  Location: [facility];  Service: Urology;  Laterality: Left;   CYSTOSCOPY W/ URETERAL STENT REMOVAL Left 08/15/2019   Procedure: CYSTOSCOPY WITH STENT REMOVAL;  Surgeon: Franchot Gallo, MD;  Location: [facility];  Service: Urology;  Laterality: Left;   CYSTOSCOPY WITH RETROGRADE PYELOGRAM, URETEROSCOPY AND STENT PLACEMENT Left 08/15/2019   Procedure: CYSTOSCOPY WITH RETROGRADE PYELOGRAM, URETEROSCOPY;  Surgeon: Franchot Gallo, MD;  Location: [facility];  Service: Urology;  Laterality: Left;  [address] [place], URETEROSCOPY AND STENT PLACEMENT Right 09/12/2019   Procedure: CYSTOSCOPY WITH RIGHT RETROGRADE PYELOGRAM  URETEROSCOPY WITH HOLMIUM LASER STONE EXTRACTION AND  STENT PLACEMENT;  Surgeon: Franchot Gallo, MD;  Location: [facility];  Service: Urology;  Laterality: Right;   DILATION AND CURETTAGE OF UTERUS     HOLMIUM LASER APPLICATION Left 16/94/5038   Procedure: HOLMIUM LASER APPLICATION;  Surgeon: Franchot Gallo, MD;  Location: [facility];  Service: Urology;  Laterality: Left;   JOINT REPLACEMENT     R knee   KIDNEY STONE SURGERY     neuro spine similator  01/2021   OVARIAN CYST REMOVAL  05/2010   right knee revision  2016   ROTATOR CUFF REPAIR Right 02/2019   SPINE SURGERY     TUBAL LIGATION      Family History  Problem Relation Age of Onset   Ulcers Mother 43       peptic ulcer   Irritable bowel syndrome Mother    Prostate cancer Father 14   Heart disease Father    Irritable bowel syndrome Father    Breast cancer Sister 39       ER+/PR+/Her2- breast cancer   Alzheimer's disease Sister    Breast cancer Paternal Grandmother    Diabetes Paternal Grandmother    Cancer Paternal Uncle        unknown cancers    Social History   Socioeconomic History   Marital status: Single    Spouse name: Not on file   Number of children: 1   Years of education: Not on file   Highest education level: Not on file  Occupational History   Occupation: retired  Tobacco Use   Smoking status: Never   Smokeless tobacco: Never  Vaping Use   Vaping Use: Never used  Substance and Sexual Activity   Alcohol use: Yes    Comment: socially   Drug use: No   Sexual activity: Not on file  Other Topics Concern   Not on file  Social History Narrative   Not on file   Social Determinants of Health   Financial Resource Strain: Not on file  Food Insecurity: Not on file  Transportation Needs: Not on file  Physical Activity: Not on file  Stress: Not on file  Social Connections: Not on file  Intimate Partner Violence: Not on file      Review of systems: All other review of systems negative except as mentioned in the HPI.   Physical Exam: There were  no vitals filed for this visit. Body mass index is 31.41 kg/m. Gen:      No acute distress HEENT:  sclera anicteric Abd:      soft, non-tender; no palpable masses, no distension Ext:    No edema Neuro: alert and oriented x 3 Psych: normal mood and affect  Data Reviewed:  Reviewed labs, radiology imaging, old records and pertinent past GI work up  Assessment and Plan/Recommendations:  72-year-old very pleasant female with history of chronic GERD, irritable bowel syndrome with constipation and diarrhea    Chronic intermittent right upper quadrant abdominal pain: Improving s/p cholecystectomy   GERD: Continue Dexilant, will send in a prescription for Dexilant 30 mg daily for 30 days Antireflux measures  Epigastric and upper abdominal burning sensation and discomfort Use Carafate 1 g before meals and at bedtime as needed   IBS constipation and diarrhea: Continue Amitiza 24 mcg twice daily, advised patient to hold Amitiza if she is experiencing frequent episodes of diarrhea   Abdominal cramping: Use dicyclomine 10 mg every 8 hours as needed   Return in 4-5 months or sooner if needed  This visit required >40 minutes of patient care (this includes precharting, chart review, review of results, face-to-face time used for counseling as well as treatment plan and follow-up. The patient was provided an opportunity to ask questions and all were answered. The patient agreed with the plan and demonstrated an understanding of the instructions.  Damaris Hippo , MD    CC: Forrest Moron, MD

## 2021-06-15 NOTE — Patient Instructions (Addendum)
We will refill your Dexilant, Amitiza and Bentyl today  We will send Carafate to your pharmacy  Follow up in 5 months   If you are age 59 or older, your body mass index should be between 23-30. Your Body mass index is 31.41 kg/m. If this is out of the aforementioned range listed, please consider follow up with your Primary Care Provider.  If you are age 68 or younger, your body mass index should be between 19-25. Your Body mass index is 31.41 kg/m. If this is out of the aformentioned range listed, please consider follow up with your Primary Care Provider.   ________________________________________________________  The [facility] providers would like to encourage you to use [facility] to communicate with providers for non-urgent requests or questions.  Due to long hold times on the telephone, sending your provider a message by [facility] may be a faster and more efficient way to get a response.  Please allow 48 business hours for a response.  Please remember that this is for non-urgent requests.  _______________________________________________________   I appreciate the  opportunity to care for you  Thank You   Harl Bowie , MD

## 2021-06-17 ENCOUNTER — Telehealth: Payer: Self-pay | Admitting: Neurology

## 2021-06-17 DIAGNOSIS — M7541 Impingement syndrome of right shoulder: Secondary | ICD-10-CM | POA: Diagnosis not present

## 2021-06-17 DIAGNOSIS — Z9889 Other specified postprocedural states: Secondary | ICD-10-CM | POA: Diagnosis not present

## 2021-06-17 DIAGNOSIS — M25511 Pain in right shoulder: Secondary | ICD-10-CM | POA: Diagnosis not present

## 2021-06-17 MED ORDER — GABAPENTIN 300 MG PO CAPS: 600.0000 mg | ORAL_CAPSULE | Freq: Three times a day (TID) | ORAL | 11 refills | Status: DC

## 2021-06-17 NOTE — Telephone Encounter (Signed)
error 

## 2021-06-17 NOTE — Telephone Encounter (Signed)
Refill done for  gabapentin yearly prescription. 600mg  po tid (300mg  caps).

## 2021-06-17 NOTE — Telephone Encounter (Signed)
Pt is requesting a refill for gabapentin (NEURONTIN) 300 MG capsule.  Pharmacy: [id number]

## 2021-06-24 ENCOUNTER — Ambulatory Visit (INDEPENDENT_AMBULATORY_CARE_PROVIDER_SITE_OTHER): Payer: Medicare Other | Admitting: Family Medicine

## 2021-06-24 DIAGNOSIS — G43711 Chronic migraine without aura, intractable, with status migrainosus: Secondary | ICD-10-CM | POA: Diagnosis not present

## 2021-06-24 NOTE — Progress Notes (Signed)
Botox- 100 units x 2 vials Lot: [id number] Expiration: 11/24 NDC: 9728-2060-15  0.9% Sodium Chloride- 73mL total Lot: [id number] Expiration: 08/08/22 NDC: 4327-6147-09  Dx: K95.747 B/B
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
## 2021-06-28 ENCOUNTER — Ambulatory Visit: Payer: Medicare Other | Admitting: Family Medicine

## 2021-07-06 ENCOUNTER — Ambulatory Visit: Payer: Medicare Other | Admitting: Cardiovascular Disease

## 2021-07-08 ENCOUNTER — Ambulatory Visit: Payer: Medicare Other | Admitting: Gastroenterology

## 2021-07-08 DIAGNOSIS — F4325 Adjustment disorder with mixed disturbance of emotions and conduct: Secondary | ICD-10-CM | POA: Diagnosis not present

## 2021-07-15 DIAGNOSIS — U071 COVID-19: Secondary | ICD-10-CM | POA: Diagnosis not present

## 2021-07-15 DIAGNOSIS — R5383 Other fatigue: Secondary | ICD-10-CM | POA: Diagnosis not present

## 2021-07-15 DIAGNOSIS — M791 Myalgia, unspecified site: Secondary | ICD-10-CM | POA: Diagnosis not present

## 2021-07-15 DIAGNOSIS — J028 Acute pharyngitis due to other specified organisms: Secondary | ICD-10-CM | POA: Diagnosis not present

## 2021-08-12 DIAGNOSIS — F4325 Adjustment disorder with mixed disturbance of emotions and conduct: Secondary | ICD-10-CM | POA: Diagnosis not present

## 2021-08-17 ENCOUNTER — Ambulatory Visit: Payer: Medicare Other | Admitting: Cardiovascular Disease

## 2021-08-30 ENCOUNTER — Ambulatory Visit: Payer: Medicare Other | Admitting: Cardiology

## 2021-08-30 NOTE — Progress Notes (Deleted)
Cardiology Office Note:    Date:  08/30/2021   ID:  [name], DOB 1961/02/05, MRN [id number]  PCP:  Drue Flirt, MD  Cardiologist:  None  Electrophysiologist:  None   Referring MD: Drue Flirt, MD   No chief complaint on file. ***  History of Present Illness:    Cassidy Olsen is a 61 y.o. female with a hx of asthma, GERD, hyperlipidemia, hypertension, prediabetes who is referred by Dr. Quentin Cornwall for evaluation of chest pain.  Past Medical History:  Diagnosis Date   Arthritis 2010   Asthma    Chronic headaches    Chronic pain    Family history of breast cancer    Family history of prostate cancer    Gallstones 2018   GERD (gastroesophageal reflux disease)    High cholesterol    History of kidney stones    Hx: UTI (urinary tract infection)    Hypertension    IBS (irritable bowel syndrome)    Interstitial cystitis 2012   Migraine    Occipital neuralgia    Osteoarthritis    PNA (pneumonia) 2018   Pre-diabetes    Spinal stenosis    Trigeminal neuralgia    Trigeminal neuralgia     Past Surgical History:  Procedure Laterality Date   ABDOMINAL HYSTERECTOMY     ANKLE SURGERY Left 05/2009   APPENDECTOMY     BACK SURGERY     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 Pershing Proud at [facility]  2014   CHOLECYSTECTOMY N/A 12/24/2020   Procedure: LAPAROSCOPIC CHOLECYSTECTOMY;  Surgeon: Coralie Keens, MD;  Location: [facility];  Service: General;  Laterality: N/A;   CYSTOSCOPY W/ URETERAL STENT PLACEMENT Left 07/25/2019   Procedure: CYSTOSCOPY WITH RETROGRADE PYELOGRAM/URETERAL STENT PLACEMENT;  Surgeon: Franchot Gallo, MD;  Location: [facility];  Service: Urology;  Laterality: Left;   CYSTOSCOPY W/ URETERAL STENT REMOVAL Left 08/15/2019   Procedure: CYSTOSCOPY WITH STENT REMOVAL;  Surgeon: Franchot Gallo, MD;  Location: [facility];  Service: Urology;  Laterality: Left;    CYSTOSCOPY WITH RETROGRADE PYELOGRAM, URETEROSCOPY AND STENT PLACEMENT Left 08/15/2019   Procedure: CYSTOSCOPY WITH RETROGRADE PYELOGRAM, URETEROSCOPY;  Surgeon: Franchot Gallo, MD;  Location: [facility];  Service: Urology;  Laterality: Left;  [address], URETEROSCOPY AND STENT PLACEMENT Right 09/12/2019   Procedure: CYSTOSCOPY WITH RIGHT RETROGRADE PYELOGRAM  URETEROSCOPY WITH HOLMIUM LASER STONE EXTRACTION AND STENT PLACEMENT;  Surgeon: Franchot Gallo, MD;  Location: [facility];  Service: Urology;  Laterality: Right;   DILATION AND CURETTAGE OF UTERUS     HOLMIUM LASER APPLICATION Left 89/38/1017   Procedure: HOLMIUM LASER APPLICATION;  Surgeon: Franchot Gallo, MD;  Location: [facility];  Service: Urology;  Laterality: Left;   JOINT REPLACEMENT     R knee   KIDNEY STONE SURGERY     neuro spine similator  01/2021   OVARIAN CYST REMOVAL  05/2010   right knee revision  2016   ROTATOR CUFF REPAIR Right 02/2019   SPINE SURGERY     TUBAL LIGATION      Current Medications: No outpatient medications have been marked as taking for the 08/30/21 encounter (Appointment) with Donato Heinz, MD.     Allergies:   Aspirin, Ketorolac tromethamine, Penicillins, Codeine, Ibuprofen, Latex, Omeprazole magnesium, Pantoprazole sodium, Penicillamine, and Pregabalin   Social History   Socioeconomic History   Marital status: Single  Spouse name: Not on file   Number of children: 1   Years of education: Not on file   Highest education level: Not on file  Occupational History   Occupation: retired  Tobacco Use   Smoking status: Never   Smokeless tobacco: Never  Vaping Use   Vaping Use: Never used  Substance and Sexual Activity   Alcohol use: Yes    Comment: socially   Drug use: No   Sexual activity: Not on file  Other Topics Concern   Not on file  Social History Narrative   Not on file   Social Determinants of Health   Financial Resource Strain:  Not on file  Food Insecurity: Not on file  Transportation Needs: Not on file  Physical Activity: Not on file  Stress: Not on file  Social Connections: Not on file     Family History: The patient's ***family history includes Alzheimer's disease in her sister; Breast cancer in her paternal grandmother; Breast cancer (age of onset: 67) in her sister; Cancer in her paternal uncle; Diabetes in her paternal grandmother; Heart disease in her father; Irritable bowel syndrome in her father and mother; Prostate cancer (age of onset: 39) in her father; Ulcers (age of onset: 71) in her mother.  ROS:   Please see the history of present illness.    *** All other systems reviewed and are negative.  EKGs/Labs/Other Studies Reviewed:    The following studies were reviewed today: ***  EKG:  EKG is *** ordered today.  The ekg ordered today demonstrates ***  Recent Labs: 12/21/2020: BUN 13; Creatinine, Ser 1.00; Hemoglobin 13.0; Platelets 394; Potassium 3.4; Sodium 139  Recent Lipid Panel    Component Value Date/Time   CHOL 176 12/25/2019 0933   TRIG 67 12/25/2019 0933   HDL 72 12/25/2019 0933   CHOLHDL 2.4 12/25/2019 0933   LDLCALC 91 12/25/2019 0933    Physical Exam:    VS:  There were no vitals taken for this visit.    Wt Readings from Last 3 Encounters:  06/15/21 174 lb 8 oz (79.2 kg)  03/30/21 178 lb 6.4 oz (80.9 kg)  12/24/20 167 lb (75.8 kg)     GEN: *** Well nourished, well developed in no acute distress HEENT: Normal NECK: No JVD; No carotid bruits LYMPHATICS: No lymphadenopathy CARDIAC: ***RRR, no murmurs, rubs, gallops RESPIRATORY:  Clear to auscultation without rales, wheezing or rhonchi  ABDOMEN: Soft, non-tender, non-distended MUSCULOSKELETAL:  No edema; No deformity  SKIN: Warm and dry NEUROLOGIC:  Alert and oriented x 3 PSYCHIATRIC:  Normal affect   ASSESSMENT:    No diagnosis found. PLAN:     Chest pain:  Hypertension: On amlodipine 10 mg  daily  Hyperlipidemia:   RTC in***   Medication Adjustments/Labs and Tests Ordered: Current medicines are reviewed at length with the patient today.  Concerns regarding medicines are outlined above.  No orders of the defined types were placed in this encounter.  No orders of the defined types were placed in this encounter.   There are no Patient Instructions on file for this visit.   Signed, Donato Heinz, MD  08/30/2021 6:07 AM    [place]

## 2021-09-02 DIAGNOSIS — F4325 Adjustment disorder with mixed disturbance of emotions and conduct: Secondary | ICD-10-CM | POA: Diagnosis not present

## 2021-09-15 ENCOUNTER — Ambulatory Visit: Payer: Medicare Other | Admitting: Pulmonary Disease

## 2021-09-15 ENCOUNTER — Encounter: Payer: Self-pay | Admitting: Pulmonary Disease

## 2021-09-15 ENCOUNTER — Other Ambulatory Visit: Payer: Self-pay

## 2021-09-15 ENCOUNTER — Ambulatory Visit (INDEPENDENT_AMBULATORY_CARE_PROVIDER_SITE_OTHER): Payer: Medicare Other | Admitting: Pulmonary Disease

## 2021-09-15 VITALS — BP 122/88 | HR 115 | Temp 98.1°F | Ht 62.0 in | Wt 184.6 lb

## 2021-09-15 DIAGNOSIS — R058 Other specified cough: Secondary | ICD-10-CM | POA: Diagnosis not present

## 2021-09-15 DIAGNOSIS — J302 Other seasonal allergic rhinitis: Secondary | ICD-10-CM

## 2021-09-15 DIAGNOSIS — J4520 Mild intermittent asthma, uncomplicated: Secondary | ICD-10-CM

## 2021-09-15 DIAGNOSIS — J069 Acute upper respiratory infection, unspecified: Secondary | ICD-10-CM | POA: Diagnosis not present

## 2021-09-15 DIAGNOSIS — F4325 Adjustment disorder with mixed disturbance of emotions and conduct: Secondary | ICD-10-CM | POA: Diagnosis not present

## 2021-09-15 MED ORDER — BUDESONIDE-FORMOTEROL FUMARATE 80-4.5 MCG/ACT IN AERO: 2.0000 | INHALATION_SPRAY | Freq: Two times a day (BID) | RESPIRATORY_TRACT | 11 refills | Status: DC | PRN

## 2021-09-15 MED ORDER — MONTELUKAST SODIUM 10 MG PO TABS: 10.0000 mg | ORAL_TABLET | Freq: Every day | ORAL | 11 refills | Status: DC

## 2021-09-15 NOTE — Progress Notes (Signed)
Synopsis: Referred in September 2019 for asthma by Drue Flirt, MD  Subjective:   PATIENT ID: Cassidy Olsen GENDER: female DOB: 1960-11-05, MRN: [id number]  Chief Complaint  Patient presents with   Follow-up    Follow up.    Seen initially for SOB. First week of august she was sick, cough, fevers, chills. She was treated with azithromycin. She has pain her left chest that she describes as pleuritic. The pain lasted for about 3 weeks. 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 The MRI revealed an abnormality in the chest.   She started to feel some better. She still has a dry cough. Her SOB is better and no issue with climbing stairs. Of note, she has had several bouts of bronchitis. Routinely, usually 1-2 times per year with bronchitis. She was told that she may have chronic bronchitis. Patient does have seasonal allergies.  She does use Flonase for allergic rhinitis.  This is usually worse in the fall and early spring time.  She denies aspirin and sensitivity.  She does have a history of asthma.  She had episodic wheezing diagnosed in her mid 106s.  She stated she was much heavier at this time.  And once losing significant amount awake her respiratory symptoms improved.  She does not have any pets, birds, dogs or cats in the home.  She is retired and works Retail buyer.She was started on Symbicort as well as as needed albuterol by her primary care provider which states has improved her symptoms.  She has not been terribly compliant with the use of Symbicort.  OV 05/30/2018: She feels like she is breathing better. She has been using symbicort regularly as well as the singulair. She does feel like this is her worst time of year. She does feel as if the medications are maybe helping. Since our last visit she had a PET scan completed for evaluation of the lung nodule found on original CT imaging.  The PET scan showed near complete resolution of this area of the lung.   She does have a small area of groundglass remaining.  As for her bronchial symptoms she still has occasional chest tightness.  But overall has significantly improved on Symbicort.  Patient denies chest pain, fevers, sputum production today.  OV 05/13/2019: Patient was seen in June 2020 by ENT for chronic cough.  Patient was given behavioral modifications to help stop the constant throat clearing sensations.  Overall she has been doing well since her last office visit.  I have not seen her in nearly a year now.  With her current medication regimen of Singulair, Symbicort, albuterol, Flonase, antihistamine her allergy symptoms and breathing symptoms have significantly improved.  She did have a bout back in the springtime which she saw Dr. Melvyn Novas.  Overall, she needs refills of her medications today.  She does feel like she is not always compliant with her Symbicort and has been using it more as needed recently.  She did state that the fall was her most common season for significant allergy symptoms.  I encouraged her to restart her Symbicort during this time to help improve some of her symptoms.  We reviewed her pulmonary function test that was completed last week.  A FEV1 of 2.5 L, 120% predicted no significant bronchodilator response, RV 159%, RV/TLC ratio of 130, DLCO 109.  OV 06/08/2020: This is a 61 year old female here for 1 year follow-up for management of mild persistent asthma.  She does note that she has for the past 3 to 5 days upper respiratory tract symptoms.  Sore throat congestion.  Was seen in urgent care recommended symptomatic therapy.  She also had a COVID-19 test that was ordered.  At the time of the office visit the test did not return however during her office visit she received a telephone call stating that her COVID-19 test was negative.  She does state that she is slowly starting to feel a little better.  Overall denies fevers chills night sweats.  She went to go get a Covid test because she  recently just got back from [place] where she went to [place] for a concert.  She is fully vaccinated.  As for her asthma symptoms well managed with Symbicort.  She recently through the summer had only been using it as needed but as the season change she had increased to regular use of her inhaler regimen.  She continues to maintain on antihistamines plus Singulair plus Flonase.  Her cough has overall resolved since she was last seen by [place].  OV 09/15/2021: Last seen by me in November 2021.  Here today for follow-up. From a respiratory stand point. Doing well. Recently went back to school at [employer] for [employer]. COVID 19 back in decemeber. She was using her inhaler then.  Here today for mild intermittent asthma follow-up.  Not routinely using her Symbicort.  She does use it as needed.  Also using her albuterol as needed.  She does that she use it this morning.     Past Medical History:  Diagnosis Date   Arthritis 2010   Asthma    Chronic headaches    Chronic pain    Family history of breast cancer    Family history of prostate cancer    Gallstones 2018   GERD (gastroesophageal reflux disease)    High cholesterol    History of kidney stones    Hx: UTI (urinary tract infection)    Hypertension    IBS (irritable bowel syndrome)    Interstitial cystitis 2012   Migraine    Occipital neuralgia    Osteoarthritis    PNA (pneumonia) 2018   Pre-diabetes    Spinal stenosis    Trigeminal neuralgia    Trigeminal neuralgia      Family History  Problem Relation Age of Onset   Ulcers Mother 54       peptic ulcer   Irritable bowel syndrome Mother    Prostate cancer Father 6   Heart disease Father    Irritable bowel syndrome Father    Breast cancer Sister 82       ER+/PR+/Her2- breast cancer   Alzheimer's disease Sister    Breast cancer Paternal Grandmother    Diabetes Paternal Grandmother    Cancer Paternal Uncle        unknown cancers     Social History   Socioeconomic  History   Marital status: Single    Spouse name: Not on file   Number of children: 1   Years of education: Not on file   Highest education level: Not on file  Occupational History   Occupation: retired  Tobacco Use   Smoking status: Never   Smokeless tobacco: Never  Vaping Use   Vaping Use: Never used  Substance and Sexual Activity   Alcohol use: Yes    Comment: socially   Drug use: No   Sexual activity: Not on file  Other Topics Concern   Not  on file  Social History Narrative   Not on file   Social Determinants of Health   Financial Resource Strain: Not on file  Food Insecurity: Not on file  Transportation Needs: Not on file  Physical Activity: Not on file  Stress: Not on file  Social Connections: Not on file  Intimate Partner Violence: Not on file     Allergies  Allergen Reactions   Aspirin Other (See Comments), Nausea And Vomiting and Nausea Only    Stomach upset Avoids due to IBS Due to ibs Stomach upset Due to ibs Avoids due to IBS   Ketorolac Tromethamine Shortness Of Breath, Itching and Other (See Comments)    IM administration ONLY  REDNESS IM administration ONLY  REDNESS  SWELLING OF THE THROAT   ORAL TORADOL CAUSED EXTREME REDNESS, ITCHINESS, DIFFICULTY BREATHING, AND FACIAL SWELLING. PATIENT ALSO DIAGNOSED WITH BELL'S PALSY.   Penicillins Hives, Itching, Rash and Other (See Comments)    Did it involve swelling of the face/tongue/throat, SOB, or low BP? No Did it involve sudden or severe rash/hives, skin peeling, or any reaction on the inside of your mouth or nose? Yes Did you need to seek medical attention at a hospital or doctor's office? No When did it last happen?      2000 If all above answers are NO, may proceed with cephalosporin use.  Vaginal rash Vaginal rash Did it involve swelling of the face/tongue/throat, SOB, or low BP? No Did it involve sudden or severe rash/hives, skin peeling, or any reaction on the inside of your mouth or  nose? Yes Did you need to seek medical attention at a hospital or doctor's office? No When did it last happen?      2000 If all above answers are NO, may proceed with cephalosporin use. Vaginal rash Vaginal rash   Codeine Other (See Comments)    Cough meds with codeine-have withdraw symptoms when done Cough meds with codeine-have withdraw symptoms when done Cough meds with codeine-have withdraw symptoms when done   Ibuprofen Other (See Comments) and Nausea And Vomiting    Stomach upset Avoids due to IBS Stomach upset Avoids due to IBS   Latex Rash and Other (See Comments)    Vaginal irritation Vaginal irritation Exacerbates genital herpes Exacerbates genital herpes   Omeprazole Magnesium Other (See Comments)    Lack of therapeutic effect Lack of therapeutic effect   Pantoprazole Sodium Other (See Comments)    Lack of therapeutic effect Lack of therapeutic effect   Penicillamine Rash   Pregabalin Other (See Comments)    Weight gain Weight gain     Outpatient Medications Prior to Visit  Medication Sig Dispense Refill   ACETAMINOPHEN PO Take 650 mg by mouth every 8 (eight) hours as needed for moderate pain.     albuterol (VENTOLIN HFA) 108 (90 Base) MCG/ACT inhaler Inhale 2 puffs into the lungs every 6 (six) hours as needed for wheezing or shortness of breath. 18 g 11   AMBULATORY NON FORMULARY MEDICATION 1 tablet 2 (two) times daily. Medication Name: Migrelief     amitriptyline (ELAVIL) 50 MG tablet Take 1 tablet (50 mg total) by mouth at bedtime. 90 tablet 3   amLODipine (NORVASC) 10 MG tablet Take 10 mg by mouth at bedtime.     celecoxib (CELEBREX) 200 MG capsule Take 200 mg by mouth 2 (two) times daily.     dexlansoprazole (DEXILANT) 60 MG capsule Take 1 capsule (60 mg total) by mouth daily. 30 capsule 11  dicyclomine (BENTYL) 10 MG capsule TAKE 1 TO 2 CAPSULES BY MOUTH EVERY 8 HOURS AS NEEDED 60 capsule 3   fenofibrate (TRICOR) 145 MG tablet Take 1 tablet (145 mg total)  by mouth daily. 90 tablet 1   fluticasone (FLONASE) 50 MCG/ACT nasal spray Place 2 sprays into both nostrils daily. 16 g 11   Fremanezumab-vfrm (AJOVY) 225 MG/1.5ML SOAJ Inject 225 mg into the skin every 30 (thirty) days. 1.5 mL 11   gabapentin (NEURONTIN) 300 MG capsule Take 2 capsules (600 mg total) by mouth 3 (three) times daily. 180 capsule 11   lubiprostone (AMITIZA) 24 MCG capsule Take 1 capsule (24 mcg total) by mouth 2 (two) times daily with a meal. 180 capsule 3   magnesium oxide (MAG-OX) 400 MG tablet Take 400 mg by mouth daily.     montelukast (SINGULAIR) 10 MG tablet Take 10 mg by mouth at bedtime.     Probiotic Product (PROBIOTIC DAILY PO) Take 1 capsule by mouth daily.     Spacer/Aero-Holding Chambers (AEROCHAMBER MV) inhaler Use as instructed 1 each 0   valACYclovir (VALTREX) 1000 MG tablet Take 1 tablet (1,000 mg total) by mouth 3 (three) times daily. (Patient taking differently: Take 1,000 mg by mouth 3 (three) times daily as needed (outbreaks).) 21 tablet 0   valACYclovir (VALTREX) 500 MG tablet Take 500 mg by mouth 2 (two) times daily as needed (outbreaks).      budesonide-formoterol (SYMBICORT) 80-4.5 MCG/ACT inhaler Inhale 2 puffs into the lungs 2 (two) times daily as needed (asthma).     sucralfate (CARAFATE) 1 g tablet Take 1 tablet (1 g total) by mouth 2 (two) times daily. (Patient not taking: Reported on 09/15/2021) 60 tablet 1   Ubrogepant (UBRELVY) 100 MG TABS Take 100 mg by mouth every 2 (two) hours as needed. Maximum 221m a day. (Patient not taking: Reported on 09/15/2021) 16 tablet 11   Ubrogepant (UBRELVY) 100 MG TABS Take one onset may repeat in 2 hours if needed  Max 2 per 24 hours  NDC - [address]-10Lot -[id number]-07/2021 4 tablet 0   No facility-administered medications prior to visit.    Review of Systems  Constitutional:  Negative for chills, fever, malaise/fatigue and weight loss.  HENT:  Negative for hearing loss, sore throat and tinnitus.   Eyes:   Negative for blurred vision and double vision.  Respiratory:  Positive for shortness of breath. Negative for cough, hemoptysis, sputum production, wheezing and stridor.   Cardiovascular:  Negative for chest pain, palpitations, orthopnea, leg swelling and PND.  Gastrointestinal:  Negative for abdominal pain, constipation, diarrhea, heartburn, nausea and vomiting.  Genitourinary:  Negative for dysuria, hematuria and urgency.  Musculoskeletal:  Negative for joint pain and myalgias.  Skin:  Negative for itching and rash.  Neurological:  Negative for dizziness, tingling, weakness and headaches.  Endo/Heme/Allergies:  Negative for environmental allergies. Does not bruise/bleed easily.  Psychiatric/Behavioral:  Negative for depression. The patient is not nervous/anxious and does not have insomnia.   All other systems reviewed and are negative.   Objective:  Physical Exam Vitals reviewed.  Constitutional:      General: She is not in acute distress.    Appearance: She is well-developed.  HENT:     Head: Normocephalic and atraumatic.  Eyes:     General: No scleral icterus.    Conjunctiva/sclera: Conjunctivae normal.     Pupils: Pupils are equal, round, and reactive to light.  Neck:     Vascular: No JVD.  Trachea: No tracheal deviation.  Cardiovascular:     Rate and Rhythm: Normal rate and regular rhythm.     Heart sounds: Normal heart sounds. No murmur heard. Pulmonary:     Effort: Pulmonary effort is normal. No tachypnea, accessory muscle usage or respiratory distress.     Breath sounds: Normal breath sounds. No stridor. No wheezing, rhonchi or rales.  Abdominal:     General: Bowel sounds are normal. There is no distension.     Palpations: Abdomen is soft.     Tenderness: There is no abdominal tenderness.  Musculoskeletal:        General: No tenderness.     Cervical back: Neck supple.  Lymphadenopathy:     Cervical: No cervical adenopathy.  Skin:    General: Skin is warm and  dry.     Capillary Refill: Capillary refill takes less than 2 seconds.     Findings: No rash.  Neurological:     Mental Status: She is alert and oriented to person, place, and time.  Psychiatric:        Behavior: Behavior normal.     Vitals:   09/15/21 1019  BP: 122/88  Pulse: (!) 115  Temp: 98.1 F (36.7 C)  TempSrc: Oral  SpO2: 97%  Weight: 184 lb 9.6 oz (83.7 kg)  Height: _0  (1.575 m)   97% on RA BMI Readings from Last 3 Encounters:  09/15/21 33.76 kg/m  06/15/21 31.41 kg/m  03/30/21 32.63 kg/m   Wt Readings from Last 3 Encounters:  09/15/21 184 lb 9.6 oz (83.7 kg)  06/15/21 174 lb 8 oz (79.2 kg)  03/30/21 178 lb 6.4 oz (80.9 kg)   CBC    Component Value Date/Time   WBC 9.0 12/21/2020 1427   RBC 4.16 12/21/2020 1427   HGB 13.0 12/21/2020 1427   HCT 39.0 12/21/2020 1427   PLT 394 12/21/2020 1427   MCV 93.8 12/21/2020 1427   MCH 31.3 12/21/2020 1427   MCHC 33.3 12/21/2020 1427   RDW 12.8 12/21/2020 1427   LYMPHSABS 3,653 04/18/2018 1412   MONOABS 0.6 11/18/2016 2303   EOSABS 211 04/18/2018 1412   BASOSABS 73 04/18/2018 1412    Regional Allergy Panel 04/18/2018 - Negative  IgE: 47  Chest Imaging:  CXR 04/17/2018: Left sided nodular density   Nuclear medicine PET scan: 05/21/2018: SUV max 2.2 left lower lobe bandlike opacity seen on prior CT imaging.  Much improved the last nodular masslike appearance. 4 mm right ureteropelvic junction calculus, cystic left adnexal mass no metabolic activity.    Pulmonary Functions Testing Results: PFT Results Latest Ref Rng & Units 05/03/2019  FVC-Pre L 3.14  FVC-Predicted Pre % 119  FVC-Post L 3.14  FVC-Predicted Post % 119  Pre FEV1/FVC % % 76  Post FEV1/FCV % % 79  FEV1-Pre L 2.38  FEV1-Predicted Pre % 114  FEV1-Post L 2.49  DLCO uncorrected ml/min/mmHg 21.67  DLCO [facility]% % 109  DLVA Predicted % 103  TLC L 6.05  TLC % Predicted % 123  RV % Predicted % 159    FeNO: None  Pathology:  None  Echocardiogram: None  Heart Catheterization: None    Assessment & Plan:    Mild intermittent asthma, unspecified whether complicated  Seasonal allergies  Upper airway cough syndrome  Viral upper respiratory tract infection  Discussion:  This is a 61 year old female, BMI of 33, allergic rhinitis, mild intermittent asthma symptoms.  History of COVID x2.  Prior negative RAST and mild elevated IgE  at 23.  History of community-acquired pneumonia.  Was originally followed for lung nodule that dissipated and and considered benign.  Now currently managing her upper airway cough, chronic seasonal allergies and mild intermittent asthma system  Plan: Refills given today of her Symbicort and Singulair. She can continue her Symbicort albuterol Singulair and Flonase for the management of her symptoms above. Patient to follow-up with [place] in 1 year or as needed.    Current Outpatient Medications:    ACETAMINOPHEN PO, Take 650 mg by mouth every 8 (eight) hours as needed for moderate pain., Disp: , Rfl:    albuterol (VENTOLIN HFA) 108 (90 Base) MCG/ACT inhaler, Inhale 2 puffs into the lungs every 6 (six) hours as needed for wheezing or shortness of breath., Disp: 18 g, Rfl: 11   AMBULATORY NON FORMULARY MEDICATION, 1 tablet 2 (two) times daily. Medication Name: Migrelief, Disp: , Rfl:    amitriptyline (ELAVIL) 50 MG tablet, Take 1 tablet (50 mg total) by mouth at bedtime., Disp: 90 tablet, Rfl: 3   amLODipine (NORVASC) 10 MG tablet, Take 10 mg by mouth at bedtime., Disp: , Rfl:    celecoxib (CELEBREX) 200 MG capsule, Take 200 mg by mouth 2 (two) times daily., Disp: , Rfl:    dexlansoprazole (DEXILANT) 60 MG capsule, Take 1 capsule (60 mg total) by mouth daily., Disp: 30 capsule, Rfl: 11   dicyclomine (BENTYL) 10 MG capsule, TAKE 1 TO 2 CAPSULES BY MOUTH EVERY 8 HOURS AS NEEDED, Disp: 60 capsule, Rfl: 3   fenofibrate (TRICOR) 145 MG tablet, Take 1 tablet (145 mg total) by mouth daily., Disp:  90 tablet, Rfl: 1   fluticasone (FLONASE) 50 MCG/ACT nasal spray, Place 2 sprays into both nostrils daily., Disp: 16 g, Rfl: 11   Fremanezumab-vfrm (AJOVY) 225 MG/1.5ML SOAJ, Inject 225 mg into the skin every 30 (thirty) days., Disp: 1.5 mL, Rfl: 11   gabapentin (NEURONTIN) 300 MG capsule, Take 2 capsules (600 mg total) by mouth 3 (three) times daily., Disp: 180 capsule, Rfl: 11   lubiprostone (AMITIZA) 24 MCG capsule, Take 1 capsule (24 mcg total) by mouth 2 (two) times daily with a meal., Disp: 180 capsule, Rfl: 3   magnesium oxide (MAG-OX) 400 MG tablet, Take 400 mg by mouth daily., Disp: , Rfl:    montelukast (SINGULAIR) 10 MG tablet, Take 10 mg by mouth at bedtime., Disp: , Rfl:    montelukast (SINGULAIR) 10 MG tablet, Take 1 tablet (10 mg total) by mouth at bedtime., Disp: 30 tablet, Rfl: 11   Probiotic Product (PROBIOTIC DAILY PO), Take 1 capsule by mouth daily., Disp: , Rfl:    Spacer/Aero-Holding Chambers (AEROCHAMBER MV) inhaler, Use as instructed, Disp: 1 each, Rfl: 0   valACYclovir (VALTREX) 1000 MG tablet, Take 1 tablet (1,000 mg total) by mouth 3 (three) times daily. (Patient taking differently: Take 1,000 mg by mouth 3 (three) times daily as needed (outbreaks).), Disp: 21 tablet, Rfl: 0   valACYclovir (VALTREX) 500 MG tablet, Take 500 mg by mouth 2 (two) times daily as needed (outbreaks). , Disp: , Rfl:    budesonide-formoterol (SYMBICORT) 80-4.5 MCG/ACT inhaler, Inhale 2 puffs into the lungs 2 (two) times daily as needed (asthma)., Disp: 1 each, Rfl: 11   sucralfate (CARAFATE) 1 g tablet, Take 1 tablet (1 g total) by mouth 2 (two) times daily. (Patient not taking: Reported on 09/15/2021), Disp: 60 tablet, Rfl: 1   Ubrogepant (UBRELVY) 100 MG TABS, Take 100 mg by mouth every 2 (two) hours as needed.  Maximum 286m a day. (Patient not taking: Reported on 09/15/2021), Disp: 16 tablet, Rfl: 1Devol DO [facility] 09/15/2021 10:29 AM

## 2021-09-15 NOTE — Patient Instructions (Addendum)
Thank you for visiting Dr. Valeta Harms at [facility]. Today we recommend the following:  Meds ordered this encounter  Medications   montelukast (SINGULAIR) 10 MG tablet    Sig: Take 1 tablet (10 mg total) by mouth at bedtime.    Dispense:  30 tablet    Refill:  11   budesonide-formoterol (SYMBICORT) 80-4.5 MCG/ACT inhaler    Sig: Inhale 2 puffs into the lungs 2 (two) times daily as needed (asthma).    Dispense:  1 each    Refill:  11   Return in about 1 year (around 09/15/2022), or if symptoms worsen or fail to improve, for with APP or Dr. Valeta Harms.    Please do your part to reduce the spread of COVID-19.

## 2021-09-21 ENCOUNTER — Ambulatory Visit: Payer: Medicare Other | Admitting: Family Medicine

## 2021-09-24 ENCOUNTER — Ambulatory Visit (INDEPENDENT_AMBULATORY_CARE_PROVIDER_SITE_OTHER): Payer: Medicare Other | Admitting: Cardiovascular Disease

## 2021-09-24 ENCOUNTER — Other Ambulatory Visit: Payer: Self-pay

## 2021-09-24 ENCOUNTER — Encounter: Payer: Self-pay | Admitting: Cardiovascular Disease

## 2021-09-24 VITALS — BP 138/96 | HR 115 | Ht 62.0 in | Wt 181.4 lb

## 2021-09-24 DIAGNOSIS — E782 Mixed hyperlipidemia: Secondary | ICD-10-CM

## 2021-09-24 DIAGNOSIS — I10 Essential (primary) hypertension: Secondary | ICD-10-CM | POA: Diagnosis not present

## 2021-09-24 DIAGNOSIS — R079 Chest pain, unspecified: Secondary | ICD-10-CM | POA: Diagnosis not present

## 2021-09-24 DIAGNOSIS — E785 Hyperlipidemia, unspecified: Secondary | ICD-10-CM

## 2021-09-24 NOTE — Progress Notes (Signed)
09/24/2021 [name]   11-Oct-1960  [id number]  Primary Physician Drue Flirt, MD Primary Cardiologist: Lorretta Harp MD Lupe Carney, [place]  HPI:  Cassidy Olsen is a 61 y.o. moderately overweight single African-American female mother of 51, grandmother 1 grandchild who was referred by Dr. Quentin Cornwall, her PCP, for exertional chest pain.  She is retired from working at the [employer] because of trigeminal neuralgia and back pain.  She grew up in [place], lived in [place] but has been in [place] for last 6 years.  Her risk factors include treated hypertension and hyperlipidemia as well as family history with a father who had CABG.  She is never had a heart attack or stroke.  She does get some dyspnea on exertion and and has reactive airways disease being treated by pulmonologist.  She noticed substernal chest pain walking up stairs a year ago but has not had this for the last 6 to 8 months.   Current Meds  Medication Sig   ACETAMINOPHEN PO Take 650 mg by mouth every 8 (eight) hours as needed for moderate pain.   albuterol (VENTOLIN HFA) 108 (90 Base) MCG/ACT inhaler Inhale 2 puffs into the lungs every 6 (six) hours as needed for wheezing or shortness of breath.   AMBULATORY NON FORMULARY MEDICATION 1 tablet 2 (two) times daily. Medication Name: Migrelief   amitriptyline (ELAVIL) 50 MG tablet Take 1 tablet (50 mg total) by mouth at bedtime.   amLODipine (NORVASC) 10 MG tablet Take 10 mg by mouth at bedtime.   budesonide-formoterol (SYMBICORT) 80-4.5 MCG/ACT inhaler Inhale 2 puffs into the lungs 2 (two) times daily as needed (asthma).   celecoxib (CELEBREX) 200 MG capsule Take 200 mg by mouth 2 (two) times daily.   dexlansoprazole (DEXILANT) 60 MG capsule Take 1 capsule (60 mg total) by mouth daily.   dicyclomine (BENTYL) 10 MG capsule TAKE 1 TO 2 CAPSULES BY MOUTH EVERY 8 HOURS AS NEEDED   fenofibrate (TRICOR) 145 MG tablet Take 1 tablet (145 mg total)  by mouth daily.   fluticasone (FLONASE) 50 MCG/ACT nasal spray Place 2 sprays into both nostrils daily.   gabapentin (NEURONTIN) 300 MG capsule Take 2 capsules (600 mg total) by mouth 3 (three) times daily.   lubiprostone (AMITIZA) 24 MCG capsule Take 1 capsule (24 mcg total) by mouth 2 (two) times daily with a meal.   magnesium oxide (MAG-OX) 400 MG tablet Take 400 mg by mouth daily as needed.   montelukast (SINGULAIR) 10 MG tablet Take 10 mg by mouth at bedtime.   montelukast (SINGULAIR) 10 MG tablet Take 1 tablet (10 mg total) by mouth at bedtime.   Spacer/Aero-Holding Chambers (AEROCHAMBER MV) inhaler Use as instructed   valACYclovir (VALTREX) 1000 MG tablet Take 1 tablet (1,000 mg total) by mouth 3 (three) times daily. (Patient taking differently: Take 1,000 mg by mouth 3 (three) times daily as needed (outbreaks).)   valACYclovir (VALTREX) 500 MG tablet Take 500 mg by mouth 2 (two) times daily as needed (outbreaks).      Allergies  Allergen Reactions   Aspirin Other (See Comments), Nausea And Vomiting and Nausea Only    Stomach upset Avoids due to IBS Due to ibs Stomach upset Due to ibs Avoids due to IBS   Ketorolac Tromethamine Shortness Of Breath, Itching and Other (See Comments)    IM administration ONLY  REDNESS IM administration ONLY  REDNESS  SWELLING OF THE THROAT   ORAL TORADOL CAUSED EXTREME  REDNESS, ITCHINESS, DIFFICULTY BREATHING, AND FACIAL SWELLING. PATIENT ALSO DIAGNOSED WITH BELL'S PALSY.   Penicillins Hives, Itching, Rash and Other (See Comments)    Did it involve swelling of the face/tongue/throat, SOB, or low BP? No Did it involve sudden or severe rash/hives, skin peeling, or any reaction on the inside of your mouth or nose? Yes Did you need to seek medical attention at a hospital or doctor's office? No When did it last happen?      2000 If all above answers are NO, may proceed with cephalosporin use.  Vaginal rash Vaginal rash Did it involve swelling  of the face/tongue/throat, SOB, or low BP? No Did it involve sudden or severe rash/hives, skin peeling, or any reaction on the inside of your mouth or nose? Yes Did you need to seek medical attention at a hospital or doctor's office? No When did it last happen?      2000 If all above answers are NO, may proceed with cephalosporin use. Vaginal rash Vaginal rash   Codeine Other (See Comments)    Cough meds with codeine-have withdraw symptoms when done Cough meds with codeine-have withdraw symptoms when done Cough meds with codeine-have withdraw symptoms when done   Ibuprofen Other (See Comments) and Nausea And Vomiting    Stomach upset Avoids due to IBS Stomach upset Avoids due to IBS   Latex Rash and Other (See Comments)    Vaginal irritation Vaginal irritation Exacerbates genital herpes Exacerbates genital herpes   Omeprazole Magnesium Other (See Comments)    Lack of therapeutic effect Lack of therapeutic effect   Pantoprazole Sodium Other (See Comments)    Lack of therapeutic effect Lack of therapeutic effect   Penicillamine Rash   Pregabalin Other (See Comments)    Weight gain Weight gain    Social History   Socioeconomic History   Marital status: Single    Spouse name: Not on file   Number of children: 1   Years of education: Not on file   Highest education level: Not on file  Occupational History   Occupation: retired  Tobacco Use   Smoking status: Never   Smokeless tobacco: Never  Vaping Use   Vaping Use: Never used  Substance and Sexual Activity   Alcohol use: Yes    Comment: socially   Drug use: No   Sexual activity: Not on file  Other Topics Concern   Not on file  Social History Narrative   Not on file   Social Determinants of Health   Financial Resource Strain: Not on file  Food Insecurity: Not on file  Transportation Needs: Not on file  Physical Activity: Not on file  Stress: Not on file  Social Connections: Not on file  Intimate Partner  Violence: Not on file     Review of Systems: General: negative for chills, fever, night sweats or weight changes.  Cardiovascular: negative for chest pain, dyspnea on exertion, edema, orthopnea, palpitations, paroxysmal nocturnal dyspnea or shortness of breath Dermatological: negative for rash Respiratory: negative for cough or wheezing Urologic: negative for hematuria Abdominal: negative for nausea, vomiting, diarrhea, bright red blood per rectum, melena, or hematemesis Neurologic: negative for visual changes, syncope, or dizziness All other systems reviewed and are otherwise negative except as noted above.    Blood pressure (!) 138/96, pulse (!) 115, height 5\' 2"  (1.575 m), weight 181 lb 6.4 oz (82.3 kg), SpO2 96 %.  General appearance: alert and no distress Neck: no adenopathy, no carotid bruit, no JVD, supple,  symmetrical, trachea midline, and thyroid not enlarged, symmetric, no tenderness/mass/nodules Lungs: clear to auscultation bilaterally Heart: regular rate and rhythm, S1, S2 normal, no murmur, click, rub or gallop Extremities: extremities normal, atraumatic, no cyanosis or edema Pulses: 2+ and symmetric Skin: Skin color, texture, turgor normal. No rashes or lesions Neurologic: Grossly normal  EKG sinus tachycardia 115 without ST or T wave changes.  I personally reviewed this EKG.  There were small inferior Q waves as well.  ASSESSMENT AND PLAN:   Essential hypertension History of essential hypertension a blood pressure measured today 138/96.  She is on amlodipine.  Hyperlipidemia History of hyperlipidemia on fenofibrate with lipid profile performed 05/13/2021 revealing total cholesterol 208, LDL 91 HDL 77.  We will repeat a lipid liver profile.  Morbid obesity (HCC) BMI of 33.  Patient unable to lose weight on her own.  Referring to [facility] for assisted weight loss.  This may be contributing to her dyspnea.  Exertional chest pain Chest pain walking up  stairs which she has not had for the last 6 or 8 months.  She does have positive risk factors.  I am going to get a coronary calcium score to further evaluate     Lorretta Harp MD [facility], [facility] 09/24/2021 11:55 AM

## 2021-09-24 NOTE — Assessment & Plan Note (Signed)
History of essential hypertension a blood pressure measured today 138/96.  She is on amlodipine.

## 2021-09-24 NOTE — Assessment & Plan Note (Signed)
Chest pain walking up stairs which she has not had for the last 6 or 8 months.  She does have positive risk factors.  I am going to get a coronary calcium score to further evaluate

## 2021-09-24 NOTE — Patient Instructions (Signed)
Medication Instructions:  Your physician recommends that you continue on your current medications as directed. Please refer to the Current Medication list given to you today.  *If you need a refill on your cardiac medications before your next appointment, please call your pharmacy*   Lab Work: Your physician recommends that you have labs drawn today: Lipid/liver profile  If you have labs (blood work) drawn today and your tests are completely normal, you will receive your results only by: [place] (if you have MyChart) OR A paper copy in the mail If you have any lab test that is abnormal or we need to change your treatment, we will call you to review the results.   Testing/Procedures: Dr. Gwenlyn Found has ordered a CT coronary calcium score.   Test location:  [facility] ([address], [place] [address])   This is $99 out of pocket.   Coronary CalciumScan A coronary calcium scan is an imaging test used to look for deposits of calcium and other fatty materials (plaques) in the inner lining of the blood vessels of the heart (coronary arteries). These deposits of calcium and plaques can partly clog and narrow the coronary arteries without producing any symptoms or warning signs. This puts a person at risk for a heart attack. This test can detect these deposits before symptoms develop. Tell a health care provider about: Any allergies you have. All medicines you are taking, including vitamins, herbs, eye drops, creams, and over-the-counter medicines. Any problems you or family members have had with anesthetic medicines. Any blood disorders you have. Any surgeries you have had. Any medical conditions you have. Whether you are pregnant or may be pregnant. What are the risks? Generally, this is a safe procedure. However, problems may occur, including: Harm to a pregnant woman and her unborn baby. This test involves the use of radiation. Radiation exposure can be  dangerous to a pregnant woman and her unborn baby. If you are pregnant, you generally should not have this procedure done. Slight increase in the risk of cancer. This is because of the radiation involved in the test. What happens before the procedure? No preparation is needed for this procedure. What happens during the procedure? You will undress and remove any jewelry around your neck or chest. You will put on a hospital gown. Sticky electrodes will be placed on your chest. The electrodes will be connected to an electrocardiogram (ECG) machine to record a tracing of the electrical activity of your heart. A CT scanner will take pictures of your heart. During this time, you will be asked to lie still and hold your breath for 2-3 seconds while a picture of your heart is being taken. The procedure may vary among health care providers and hospitals. What happens after the procedure? You can get dressed. You can return to your normal activities. It is up to you to get the results of your test. Ask your health care provider, or the department that is doing the test, when your results will be ready. Summary A coronary calcium scan is an imaging test used to look for deposits of calcium and other fatty materials (plaques) in the inner lining of the blood vessels of the heart (coronary arteries). Generally, this is a safe procedure. Tell your health care provider if you are pregnant or may be pregnant. No preparation is needed for this procedure. A CT scanner will take pictures of your heart. You can return to your normal activities after the scan is done.  This information is not intended to replace advice given to you by your health care provider. Make sure you discuss any questions you have with your health care provider. Document Released: 01/21/2008 Document Revised: 06/13/2016 Document Reviewed: 06/13/2016 Elsevier Interactive Patient Education  2017 [place]: At [facility], you and your health needs are our priority.  As part of our continuing mission to provide you with exceptional heart care, we have created designated Provider Care Teams.  These Care Teams include your primary Cardiologist (physician) and Advanced Practice Providers (APPs -  Physician Assistants and Nurse Practitioners) who all work together to provide you with the care you need, when you need it.  We recommend signing up for the patient portal called "MyChart".  Sign up information is provided on this After Visit Summary.  MyChart is used to connect with patients for Virtual Visits (Telemedicine).  Patients are able to view lab/test results, encounter notes, upcoming appointments, etc.  Non-urgent messages can be sent to your provider as well.   To learn more about what you can do with MyChart, go to [email or web address].    Your next appointment:   3 month(s)  The format for your next appointment:   In Person  Provider: Quay Burow, MD

## 2021-09-24 NOTE — Assessment & Plan Note (Signed)
BMI of 33.  Patient unable to lose weight on her own.  Referring to [facility] for assisted weight loss.  This may be contributing to her dyspnea.

## 2021-09-24 NOTE — Assessment & Plan Note (Signed)
History of hyperlipidemia on fenofibrate with lipid profile performed 05/13/2021 revealing total cholesterol 208, LDL 91 HDL 77.  We will repeat a lipid liver profile.

## 2021-09-25 LAB — HEPATIC FUNCTION PANEL
ALT: 14 IU/L (ref 0–32)
AST: 21 IU/L (ref 0–40)
Albumin: 4.7 g/dL (ref 3.8–4.9)
Alkaline Phosphatase: 67 IU/L (ref 44–121)
Bilirubin Total: 0.3 mg/dL (ref 0.0–1.2)
Bilirubin, Direct: <0.1 mg/dL (ref 0.00–0.40)
Total Protein: 7.6 g/dL (ref 6.0–8.5)

## 2021-09-25 LAB — LIPID PANEL
Chol/HDL Ratio: 3.3 ratio (ref 0.0–4.4)
Cholesterol, Total: 216 mg/dL — ABNORMAL HIGH (ref 100–199)
HDL: 65 mg/dL (ref >39)
LDL Chol Calc (NIH): 133 mg/dL — ABNORMAL HIGH (ref 0–99)
Triglycerides: 101 mg/dL (ref 0–149)
VLDL Cholesterol Cal: 18 mg/dL (ref 5–40)

## 2021-09-30 DIAGNOSIS — F4325 Adjustment disorder with mixed disturbance of emotions and conduct: Secondary | ICD-10-CM | POA: Diagnosis not present

## 2021-10-08 ENCOUNTER — Telehealth: Payer: Self-pay

## 2021-10-08 DIAGNOSIS — E782 Mixed hyperlipidemia: Secondary | ICD-10-CM

## 2021-10-08 MED ORDER — ATORVASTATIN CALCIUM 20 MG PO TABS: 20.0000 mg | ORAL_TABLET | Freq: Every day | ORAL | 4 refills | Status: DC

## 2021-10-08 NOTE — Telephone Encounter (Signed)
Spoke with pt regarding recent cholesterol levels. Recommendations per Dr. Gwenlyn Found given to pt to start atorvastatin 40mg  once daily. Prescription sent to pt's pharmacy of choice. Explained to pt that we will repeat labs in 3 months to see how cholesterol is doing with medication. Pt verbalizes understanding. Lab orders placed and mailed to pt.  ?

## 2021-10-08 NOTE — Telephone Encounter (Signed)
-----   Message from Lorretta Harp, MD sent at 09/25/2021  7:01 AM EST ----- ?LDL 133 not on statin Rx. Start Atorva 20 mg and recheck FLP 3 months ?

## 2021-10-11 ENCOUNTER — Ambulatory Visit (INDEPENDENT_AMBULATORY_CARE_PROVIDER_SITE_OTHER): Payer: Medicare Other | Admitting: Family Medicine

## 2021-10-11 DIAGNOSIS — G43709 Chronic migraine without aura, not intractable, without status migrainosus: Secondary | ICD-10-CM | POA: Diagnosis not present
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
## 2021-10-11 NOTE — Progress Notes (Signed)
Botox- 100 units x 2 vials ?Lot: [id number] ?Expiration: 10/2023 ?[place]: [phone] ? ?Bacteriostatic 0.9% Sodium Chloride- 45m total ?Lot: [id number]?Expiration: 03/09/2023 ?[place] [address]-51? ?[place]: GT02.111?B/B ? ?

## 2021-10-12 ENCOUNTER — Ambulatory Visit: Payer: Medicare Other | Admitting: Family Medicine

## 2021-10-14 DIAGNOSIS — F4325 Adjustment disorder with mixed disturbance of emotions and conduct: Secondary | ICD-10-CM | POA: Diagnosis not present

## 2021-10-19 ENCOUNTER — Inpatient Hospital Stay: Admission: RE | Admit: 2021-10-19 | Payer: Medicare Other | Source: Ambulatory Visit

## 2021-11-09 ENCOUNTER — Inpatient Hospital Stay: Admission: RE | Admit: 2021-11-09 | Payer: Medicare Other | Source: Ambulatory Visit

## 2021-11-10 DIAGNOSIS — G8929 Other chronic pain: Secondary | ICD-10-CM | POA: Diagnosis not present

## 2021-11-10 DIAGNOSIS — Z9689 Presence of other specified functional implants: Secondary | ICD-10-CM | POA: Diagnosis not present

## 2021-11-10 DIAGNOSIS — M961 Postlaminectomy syndrome, not elsewhere classified: Secondary | ICD-10-CM | POA: Diagnosis not present

## 2021-11-10 DIAGNOSIS — M792 Neuralgia and neuritis, unspecified: Secondary | ICD-10-CM | POA: Diagnosis not present

## 2021-11-10 DIAGNOSIS — G500 Trigeminal neuralgia: Secondary | ICD-10-CM | POA: Diagnosis not present

## 2021-11-10 DIAGNOSIS — M5030 Other cervical disc degeneration, unspecified cervical region: Secondary | ICD-10-CM | POA: Diagnosis not present

## 2021-11-10 DIAGNOSIS — M47816 Spondylosis without myelopathy or radiculopathy, lumbar region: Secondary | ICD-10-CM | POA: Diagnosis not present

## 2021-11-10 DIAGNOSIS — M5481 Occipital neuralgia: Secondary | ICD-10-CM | POA: Diagnosis not present

## 2021-11-10 DIAGNOSIS — Z96651 Presence of right artificial knee joint: Secondary | ICD-10-CM | POA: Diagnosis not present

## 2021-11-10 DIAGNOSIS — M25561 Pain in right knee: Secondary | ICD-10-CM | POA: Diagnosis not present

## 2021-11-16 ENCOUNTER — Encounter: Payer: Self-pay | Admitting: Gastroenterology

## 2021-11-19 ENCOUNTER — Ambulatory Visit (INDEPENDENT_AMBULATORY_CARE_PROVIDER_SITE_OTHER): Payer: Medicare Other | Admitting: Podiatry

## 2021-11-19 ENCOUNTER — Ambulatory Visit (INDEPENDENT_AMBULATORY_CARE_PROVIDER_SITE_OTHER): Payer: Medicare Other

## 2021-11-19 ENCOUNTER — Encounter: Payer: Self-pay | Admitting: Podiatry

## 2021-11-19 DIAGNOSIS — M722 Plantar fascial fibromatosis: Secondary | ICD-10-CM

## 2021-11-19 DIAGNOSIS — M7752 Other enthesopathy of left foot: Secondary | ICD-10-CM

## 2021-11-19 MED ORDER — TRIAMCINOLONE ACETONIDE 10 MG/ML IJ SUSP
20.0000 mg | Freq: Once | INTRAMUSCULAR | Status: AC
  Administered 2021-11-19: 20 mg

## 2021-11-21 NOTE — Progress Notes (Signed)
Subjective:  ? ?Patient ID: Cassidy Olsen, female   DOB: 61 y.o.   MRN: [id number]  ? ?HPI ?Patient presents stating she is having a lot of pain left over right foot and states it is in the joint of the ankle and its also in the heel stating both of been very tender ? ? ?ROS ? ? ?   ?Objective:  ?Physical Exam  ?Neurovascular status intact with inflammation pain of the sinus tarsi left fluid buildup and also noted to be in the plantar heel with fluid buildup around the medial band ? ?   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 ?   ?Plan:  ?8 H&P reviewed both conditions and x-ray did sterile prep injected the sinus tarsi left 3 mg Kenalog 5 mg Xylocaine injected the plantar fascia 3 mg Kenalog 5 mg Xylocaine advised on supportive therapy anti-inflammatories reappoint to recheck ? ?X-rays indicate there is no spurring appears to be moderate arthritis no other pathology noted currently ?   ? ? ?

## 2021-11-25 DIAGNOSIS — E7800 Pure hypercholesterolemia, unspecified: Secondary | ICD-10-CM | POA: Diagnosis not present

## 2021-11-25 DIAGNOSIS — Z0000 Encounter for general adult medical examination without abnormal findings: Secondary | ICD-10-CM | POA: Diagnosis not present

## 2021-12-03 ENCOUNTER — Ambulatory Visit: Payer: Medicare Other | Admitting: Podiatry

## 2021-12-03 ENCOUNTER — Inpatient Hospital Stay: Admission: RE | Admit: 2021-12-03 | Payer: Medicare Other | Source: Ambulatory Visit
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
## 2021-12-22 ENCOUNTER — Ambulatory Visit: Payer: Medicare Other | Admitting: Cardiovascular Disease

## 2021-12-27 ENCOUNTER — Encounter: Payer: Self-pay | Admitting: Cardiovascular Disease

## 2021-12-27 DIAGNOSIS — R7303 Prediabetes: Secondary | ICD-10-CM | POA: Diagnosis not present

## 2021-12-27 DIAGNOSIS — Z1159 Encounter for screening for other viral diseases: Secondary | ICD-10-CM | POA: Diagnosis not present

## 2021-12-27 DIAGNOSIS — Z114 Encounter for screening for human immunodeficiency virus [HIV]: Secondary | ICD-10-CM | POA: Diagnosis not present

## 2021-12-28 DIAGNOSIS — F4325 Adjustment disorder with mixed disturbance of emotions and conduct: Secondary | ICD-10-CM | POA: Diagnosis not present

## 2021-12-29 ENCOUNTER — Ambulatory Visit
Admission: RE | Admit: 2021-12-29 | Discharge: 2021-12-29 | Disposition: A | Discharge status: Home / Self Care | Payer: Self-pay | Source: Ambulatory Visit | Attending: Cardiovascular Disease | Admitting: Cardiovascular Disease

## 2021-12-29 DIAGNOSIS — E785 Hyperlipidemia, unspecified: Secondary | ICD-10-CM

## 2021-12-29 DIAGNOSIS — I10 Essential (primary) hypertension: Secondary | ICD-10-CM

## 2022-01-05 ENCOUNTER — Ambulatory Visit (INDEPENDENT_AMBULATORY_CARE_PROVIDER_SITE_OTHER): Payer: Medicare Other | Admitting: Family Medicine

## 2022-01-05 DIAGNOSIS — G43709 Chronic migraine without aura, not intractable, without status migrainosus: Secondary | ICD-10-CM | POA: Diagnosis not present
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
## 2022-01-05 NOTE — Progress Notes (Signed)
Botox- 100 units x 2 vials Lot: [id number] Expiration: 07/2024 NDC: 0023-1145-01  Bacteriostatic 0.9% Sodium Chloride- 4mL total Lot: [id number] Expiration: 03/09/2023 NDC: 0409-1966-02  Dx: G43.709  B/B  

## 2022-01-10 DIAGNOSIS — I10 Essential (primary) hypertension: Secondary | ICD-10-CM | POA: Diagnosis not present

## 2022-01-10 DIAGNOSIS — R7303 Prediabetes: Secondary | ICD-10-CM | POA: Diagnosis not present

## 2022-01-10 DIAGNOSIS — E669 Obesity, unspecified: Secondary | ICD-10-CM | POA: Diagnosis not present

## 2022-01-24 ENCOUNTER — Ambulatory Visit: Payer: Medicare Other | Admitting: Nurse Practitioner

## 2022-01-25 DIAGNOSIS — F4325 Adjustment disorder with mixed disturbance of emotions and conduct: Secondary | ICD-10-CM | POA: Diagnosis not present

## 2022-01-26 ENCOUNTER — Telehealth: Payer: Self-pay | Admitting: Nurse Practitioner

## 2022-01-26 ENCOUNTER — Encounter: Payer: Self-pay | Admitting: Nurse Practitioner

## 2022-01-26 ENCOUNTER — Ambulatory Visit (INDEPENDENT_AMBULATORY_CARE_PROVIDER_SITE_OTHER): Payer: Medicare Other | Admitting: Nurse Practitioner

## 2022-01-26 ENCOUNTER — Ambulatory Visit (INDEPENDENT_AMBULATORY_CARE_PROVIDER_SITE_OTHER): Payer: Medicare Other

## 2022-01-26 VITALS — BP 124/90 | HR 94 | Temp 98.6°F | Ht 62.0 in | Wt 187.8 lb

## 2022-01-26 DIAGNOSIS — R059 Cough, unspecified: Secondary | ICD-10-CM | POA: Diagnosis not present

## 2022-01-26 DIAGNOSIS — J3089 Other allergic rhinitis: Secondary | ICD-10-CM | POA: Diagnosis not present

## 2022-01-26 DIAGNOSIS — J4531 Mild persistent asthma with (acute) exacerbation: Secondary | ICD-10-CM

## 2022-01-26 DIAGNOSIS — R0602 Shortness of breath: Secondary | ICD-10-CM | POA: Diagnosis not present

## 2022-01-26 DIAGNOSIS — R062 Wheezing: Secondary | ICD-10-CM | POA: Diagnosis not present

## 2022-01-26 DIAGNOSIS — J00 Acute nasopharyngitis [common cold]: Secondary | ICD-10-CM

## 2022-01-26 DIAGNOSIS — J302 Other seasonal allergic rhinitis: Secondary | ICD-10-CM

## 2022-01-26 DIAGNOSIS — J309 Allergic rhinitis, unspecified: Secondary | ICD-10-CM | POA: Insufficient documentation

## 2022-01-26 MED ORDER — PREDNISONE 20 MG PO TABS
40.0000 mg | ORAL_TABLET | Freq: Every day | ORAL | 0 refills | Status: AC
Start: 2022-01-26 — End: 2022-01-31

## 2022-01-26 MED ORDER — AZITHROMYCIN 250 MG PO TABS
ORAL_TABLET | ORAL | 0 refills | Status: DC
Start: 2022-01-26 — End: 2022-04-06

## 2022-01-26 MED ORDER — AZELASTINE HCL 0.1 % NA SOLN
1.0000 | Freq: Two times a day (BID) | NASAL | 5 refills | Status: DC
Start: 2022-01-26 — End: 2022-04-06

## 2022-01-26 NOTE — Progress Notes (Signed)
$'@Patient'd$  ID: Cassidy Olsen, female    DOB: [birth year], 61 y.o.   MRN: [id number]  Chief Complaint  Patient presents with   Follow-up    Referring provider: Drue Flirt, MD  HPI: 61 year old female, never smoker followed for asthma, upper airway cough syndrome and allergies.  She was previously followed for lung nodules that dissipated and considered benign.  She is a patient of Dr. Juline Patch and last seen in office on 09/15/2021.  Past medical history significant for migraines, hypertension, cystic kidney disease, HLD, obesity.  TEST/EVENTS:  05/03/2019 PFTs: FVC 119, FEV1 114, ratio 100, TLC 123, DLCOunc 109.  No significant BD, did have some mid flow reversibility.  09/15/2021: OV with Dr. Valeta Harms for overdue follow-up.  Doing well from a respiratory standpoint.  Using Symbicort as needed; refill sent.  Continued Singulair and Flonase for allergic nidus.  Follow-up 1 year or as needed.  01/26/2022: Today-acute Patient presents today for nasal congestion and persistent, dry cough.  Her symptoms started after she returned from [place] at the end of May.  She initially started having nasal dryness and thought that it was related to being away from the humid air.  She then developed persistent nasal congestion and some occasional clear nasal drainage.  She was evaluated by her PCP and started on Flonase as her nasal passages appeared inflamed.  They also referred her to ENT but her appointment is not till September.  Feels like this has helped some however, she has developed a persistent, dry cough.  She did restart using her Symbicort twice daily but feels like it has not resolved her symptoms.  Denies any increase shortness of breath but is having some occasional wheezing and chest tightness.  She denies any hemoptysis, fevers, headaches, sore throat.  She is concerned that this could be related to allergies.  She did start Zyrtec but has not noticed a significant change.  Allergies  Allergen  Reactions   Aspirin Other (See Comments), Nausea And Vomiting and Nausea Only    Stomach upset Avoids due to IBS Due to ibs Stomach upset Due to ibs Avoids due to IBS   Ketorolac Tromethamine Shortness Of Breath, Itching and Other (See Comments)    IM administration ONLY  REDNESS IM administration ONLY  REDNESS  SWELLING OF THE THROAT   ORAL TORADOL CAUSED EXTREME REDNESS, ITCHINESS, DIFFICULTY BREATHING, AND FACIAL SWELLING. PATIENT ALSO DIAGNOSED WITH BELL'S PALSY.   Penicillins Hives, Itching, Rash and Other (See Comments)    Did it involve swelling of the face/tongue/throat, SOB, or low BP? No Did it involve sudden or severe rash/hives, skin peeling, or any reaction on the inside of your mouth or nose? Yes Did you need to seek medical attention at a hospital or doctor's office? No When did it last happen?      2000 If all above answers are "NO", may proceed with cephalosporin use.  Vaginal rash Vaginal rash Did it involve swelling of the face/tongue/throat, SOB, or low BP? No Did it involve sudden or severe rash/hives, skin peeling, or any reaction on the inside of your mouth or nose? Yes Did you need to seek medical attention at a hospital or doctor's office? No When did it last happen?      2000 If all above answers are "NO", may proceed with cephalosporin use. Vaginal rash Vaginal rash   Codeine Other (See Comments)    Cough meds with codeine-have withdraw symptoms when done Cough meds with codeine-have withdraw symptoms when done  Cough meds with codeine-have withdraw symptoms when done   Ibuprofen Other (See Comments) and Nausea And Vomiting    Stomach upset Avoids due to IBS Stomach upset Avoids due to IBS   Latex Rash and Other (See Comments)    Vaginal irritation Vaginal irritation Exacerbates genital herpes Exacerbates genital herpes   Omeprazole Magnesium Other (See Comments)    Lack of therapeutic effect Lack of therapeutic effect   Pantoprazole Sodium  Other (See Comments)    Lack of therapeutic effect Lack of therapeutic effect   Penicillamine Rash   Pregabalin Other (See Comments)    Weight gain Weight gain    Immunization History  Administered Date(s) Administered   Influenza Inj Mdck Quad Pf 05/24/2017, 05/09/2018   Influenza Split 05/24/2017, 05/09/2018   Influenza,inj,Quad PF,6+ Mos 05/10/2018, 04/27/2019   Influenza,inj,quad, With Preservative 08/08/2016   Moderna Sars-Covid-2 Vaccination 09/30/2019, 10/28/2019   PFIZER Comirnaty(Gray Top)Covid-19 Tri-Sucrose Vaccine 01/27/2021   PNEUMOCOCCAL CONJUGATE-20 12/27/2021   Pneumococcal Conjugate-13 05/23/2017   Tdap 01/11/2017   Unspecified SARS-COV-2 Vaccination 01/27/2021   Zoster Recombinat (Shingrix) 02/16/2021    Past Medical History:  Diagnosis Date   Arthritis 2010   Asthma    Chronic headaches    Chronic pain    Family history of breast cancer    Family history of prostate cancer    Gallstones 2018   GERD (gastroesophageal reflux disease)    High cholesterol    History of kidney stones    Hx: UTI (urinary tract infection)    Hypertension    IBS (irritable bowel syndrome)    Interstitial cystitis 2012   Migraine    Occipital neuralgia    Osteoarthritis    PNA (pneumonia) 2018   Pre-diabetes    Spinal stenosis    Trigeminal neuralgia    Trigeminal neuralgia     Tobacco History: Social History   Tobacco Use  Smoking Status Never  Smokeless Tobacco Never   Counseling given: Not Answered   Outpatient Medications Prior to Visit  Medication Sig Dispense Refill   ACETAMINOPHEN PO Take 650 mg by mouth every 8 (eight) hours as needed for moderate pain.     albuterol (VENTOLIN HFA) 108 (90 Base) MCG/ACT inhaler Inhale 2 puffs into the lungs every 6 (six) hours as needed for wheezing or shortness of breath. 18 g 11   AMBULATORY NON FORMULARY MEDICATION 1 tablet 2 (two) times daily. Medication Name: Migrelief     amitriptyline (ELAVIL) 50 MG tablet Take  1 tablet (50 mg total) by mouth at bedtime. 90 tablet 3   budesonide-formoterol (SYMBICORT) 80-4.5 MCG/ACT inhaler Inhale 2 puffs into the lungs 2 (two) times daily as needed (asthma). 1 each 11   celecoxib (CELEBREX) 200 MG capsule Take 200 mg by mouth 2 (two) times daily.     dexlansoprazole (DEXILANT) 60 MG capsule Take 1 capsule (60 mg total) by mouth daily. 30 capsule 11   dicyclomine (BENTYL) 10 MG capsule TAKE 1 TO 2 CAPSULES BY MOUTH EVERY 8 HOURS AS NEEDED 60 capsule 3   fluticasone (FLONASE) 50 MCG/ACT nasal spray Place 2 sprays into both nostrils daily. 16 g 11   Fremanezumab-vfrm (AJOVY) 225 MG/1.5ML SOAJ Inject 225 mg into the skin every 30 (thirty) days. 1.5 mL 11   gabapentin (NEURONTIN) 300 MG capsule Take 2 capsules (600 mg total) by mouth 3 (three) times daily. 180 capsule 11   lubiprostone (AMITIZA) 24 MCG capsule Take 1 capsule (24 mcg total) by mouth 2 (two) times daily  with a meal. 180 capsule 3   montelukast (SINGULAIR) 10 MG tablet Take 1 tablet (10 mg total) by mouth at bedtime. 30 tablet 11   Spacer/Aero-Holding Chambers (AEROCHAMBER MV) inhaler Use as instructed 1 each 0   valACYclovir (VALTREX) 1000 MG tablet Take 1 tablet (1,000 mg total) by mouth 3 (three) times daily. (Patient taking differently: Take 1,000 mg by mouth 3 (three) times daily as needed (outbreaks).) 21 tablet 0   valACYclovir (VALTREX) 500 MG tablet Take 500 mg by mouth 2 (two) times daily as needed (outbreaks).      atorvastatin (LIPITOR) 20 MG tablet Take 1 tablet (20 mg total) by mouth daily. 90 tablet 4   magnesium oxide (MAG-OX) 400 MG tablet Take 400 mg by mouth daily as needed.     No facility-administered medications prior to visit.     Review of Systems:   Constitutional: No weight loss or gain, night sweats, fevers, chills, fatigue, or lassitude. HEENT: No headaches, difficulty swallowing, tooth/dental problems, or sore throat. No sneezing, itching, ear ache +nasal congestion/drainage   CV:  No chest pain, orthopnea, PND, swelling in lower extremities, anasarca, dizziness, palpitations, syncope Resp: + Dry cough; occasional wheeze; chest congestion.  No shortness of breath with exertion or at rest. No excess mucus or change in color of mucus. No productive or non-productive. No hemoptysis. No wheezing.  No chest wall deformity GI:  No heartburn, indigestion, abdominal pain, nausea, vomiting, diarrhea, change in bowel habits, loss of appetite, bloody stools.  Skin: No rash, lesions, ulcerations MSK:  No joint pain or swelling.  No decreased range of motion.  No back pain. Neuro: No dizziness or lightheadedness.  Psych: No depression or anxiety. Mood stable.     Physical Exam:  BP 124/90 (BP Location: Left Arm, Patient Position: Sitting, Cuff Size: Normal)   Pulse 94   Temp 98.6 F (37 C) (Oral)   Ht '5\' 2"'$  (1.575 m)   Wt 187 lb 12.8 oz (85.2 kg)   SpO2 99%   BMI 34.35 kg/m   GEN: Pleasant, interactive, well-appearing; in no acute distress. HEENT:  Normocephalic and atraumatic. EACs patent bilaterally. TM pearly gray with present light reflex bilaterally. PERRLA. Sclera white. Nasal turbinates erythematous, moist and patent bilaterally.  Clear rhinorrhea present. Oropharynx erythematous and moist, without exudate or edema. No lesions, ulcerations  NECK:  Supple w/ fair ROM. No JVD present. Normal carotid impulses w/o bruits. Thyroid symmetrical with no goiter or nodules palpated. No lymphadenopathy.   CV: RRR, no m/r/g, no peripheral edema. Pulses intact, +2 bilaterally. No cyanosis, pallor or clubbing. PULMONARY:  Unlabored, regular breathing.  Minimal end expiratory wheezes bilaterally A&P.  Bronchitic cough. No accessory muscle use. No dullness to percussion. GI: BS present and normoactive. Soft, non-tender to palpation. No organomegaly or masses detected. No CVA tenderness. MSK: No erythema, warmth or tenderness. Cap refil <2 sec all extrem. No deformities or joint  swelling noted.  Neuro: A/Ox3. No focal deficits noted.   Skin: Warm, no lesions or rashe Psych: Normal affect and behavior. Judgement and thought content appropriate.     Lab Results:  CBC    Component Value Date/Time   WBC 9.0 12/21/2020 1427   RBC 4.16 12/21/2020 1427   HGB 13.0 12/21/2020 1427   HCT 39.0 12/21/2020 1427   PLT 394 12/21/2020 1427   MCV 93.8 12/21/2020 1427   MCH 31.3 12/21/2020 1427   MCHC 33.3 12/21/2020 1427   RDW 12.8 12/21/2020 1427   [place]  04/18/2018 1412   MONOABS 0.6 11/18/2016 2303   EOSABS 211 04/18/2018 1412   BASOSABS 73 04/18/2018 1412    BMET    Component Value Date/Time   NA 139 12/21/2020 1427   NA 149 (H) 12/09/2019 1616   K 3.4 (L) 12/21/2020 1427   CL 103 12/21/2020 1427   CO2 25 12/21/2020 1427   GLUCOSE 83 12/21/2020 1427   BUN 13 12/21/2020 1427   BUN 19 12/09/2019 1616   CREATININE 1.00 12/21/2020 1427   CALCIUM 9.1 12/21/2020 1427   GFRNONAA >60 12/21/2020 1427   GFRAA 59 (L) 12/09/2019 1616    BNP No results found for: "BNP"   Imaging:  DG Chest 2 View  Result Date: 01/26/2022 CLINICAL DATA:  Shortness of breath, cough and wheezing. EXAM: CHEST - 2 VIEW COMPARISON:  07/17/2018 FINDINGS: A dorsal column stimulator is noted with tip terminating in the midthoracic spine. Status post ACDF. Heart size and mediastinal contours appear normal. No signs of pleural effusion or edema. No airspace opacities identified. IMPRESSION: No active cardiopulmonary abnormalities. Electronically Signed   By: Kerby Moors M.D.   On: 01/26/2022 12:21   CT CARDIAC SCORING (SELF PAY ONLY)  Addendum Date: 01/10/2022   ADDENDUM REPORT: 01/10/2022 08:59 EXAM: OVER-READ INTERPRETATION  CT CHEST The following report is an over-read performed by radiologist Dr. Norlene Duel [facility] on 01/10/2022. This over-read does not include interpretation of cardiac or coronary anatomy or pathology. The coronary calcium score  interpretation by the cardiologist is attached. COMPARISON:  CT chest 04/18/2018 FINDINGS: Vascular: No acute abnormality. Mediastinum/nodes: No mass or adenopathy identified. Lungs/pleura: No pleural effusion, airspace consolidation, atelectasis, or pneumothorax. No suspicious lung nodules. Upper abdomen: No acute abnormality. Musculoskeletal: Dorsal column stimulator lead identified within the thoracic canal. No acute or suspicious osseous findings. IMPRESSION: Negative over-read. Electronically Signed   By: Kerby Moors M.D.   On: 01/10/2022 08:59   Result Date: 01/10/2022 CLINICAL DATA:  Risk stratification: 61 Year-old African American Female EXAM: Coronary Calcium Score TECHNIQUE: The patient was scanned on a [employer]. Axial non-contrast 3 mm slices were carried out through the heart. The data set was analyzed on a dedicated work station and scored using the [place]. FINDINGS: Non-cardiac: See separate report from [facility]. Ascending Aorta: Normal caliber. Aortic atherosclerosis. Pericardium: Normal. Coronary arteries: Normal origins. Coronary Calcium Score: Left main: 0 Left anterior descending artery: 0 Left circumflex artery: 0 Right coronary artery: 0 Total: 0 Percentile: 1st for age, sex, and race matched control. IMPRESSION: 1. Coronary calcium score of 0. This was 1st percentile for age, gender, and race matched controls. 2. Aortic atherosclerosis. RECOMMENDATIONS: Coronary artery calcium (CAC) score is a strong predictor of incident coronary heart disease (CHD) and provides predictive information beyond traditional risk factors. CAC scoring is reasonable to use in the decision to withhold, postpone, or initiate statin therapy in intermediate-risk or selected borderline-risk asymptomatic adults (age 71-75 years and LDL-C >=70 to <190 mg/dL) who do not have diabetes or established atherosclerotic cardiovascular disease (ASCVD).* In intermediate-risk (10-year ASCVD risk  >=7.5% to <20%) adults or selected borderline-risk (10-year ASCVD risk >=5% to <7.5%) adults in whom a CAC score is measured for the purpose of making a treatment decision the following recommendations have been made: If CAC = 0, it is reasonable to withhold statin therapy and reassess in 5 to 10 years, as long as higher risk conditions are absent (diabetes mellitus, family history of premature CHD in first degree  relatives (males <55 years; females <65 years), cigarette smoking, LDL >=190 mg/dL or other independent risk factors). If CAC is 1 to 99, it is reasonable to initiate statin therapy for patients >=82 years of age. If CAC is >=100 or >=75th percentile, it is reasonable to initiate statin therapy at any age. Cardiology referral should be considered for patients with CAC scores =400 or >=75th percentile. *2018 AHA/ACC/AACVPR/AAPA/ABC/ACPM/ADA/AGS/APhA/ASPC/NLA/PCNA Guideline on the Management of Blood Cholesterol: A Report of the American College of Cardiology/American Heart Association Task Force on Clinical Practice Guidelines. J Am Coll Cardiol. 2019;73(24):3168-3209. Rudean Haskell, MD Electronically Signed: By: Rudean Haskell M.D. On: 12/29/2021 11:48         Latest Ref Rng & Units 05/03/2019   12:03 PM  PFT Results  FVC-Pre L 3.14   FVC-Predicted Pre % 119   FVC-Post L 3.14   FVC-Predicted Post % 119   Pre FEV1/FVC % % 76   Post FEV1/FCV % % 79   FEV1-Pre L 2.38   FEV1-Predicted Pre % 114   FEV1-Post L 2.49   DLCO uncorrected ml/min/mmHg 21.67   DLCO UNC% % 109   DLVA Predicted % 103   TLC L 6.05   TLC % Predicted % 123   RV % Predicted % 159     No results found for: "NITRICOXIDE"      Assessment & Plan:   Asthma Acute bronchitis.  Will treat with prednisone burst and Z-Pak.  Recommended that we check CXR to rule out superimposed infection.  Advised on cough control measures.  Do suspect that there is a component of upper airway irritation related to her  cough as well.  Advised that she restart Symbicort twice daily.  If stable for 3 months, we can talk about de-escalating to as needed again.  Continue as needed albuterol.  Continue Singulair and Zyrtec for trigger prevention.  Patient Instructions  Continue Albuterol inhaler 2 puffs or 3 mL neb every 6 hours as needed for shortness of breath or wheezing. Notify if symptoms persist despite rescue inhaler/neb use. Continue Symbicort 2 puffs Twice daily. Brush tongue and rinse mouth afterwards Continue flonase nasal spray 2 sprays each nostril daily Continue singulair 10 mg At bedtime Continue Zyrtec 10 mg daily for allergies  Continue Saline nasal gel in each nostril at bedtime   -Saline nasal spray 2-3 times a day  -Astelin nasal spray 1-2 sprays each nostril Twice daily until nasal congestion improves then as needed -Prednisone 40 mg daily for 5 days. Take in AM with food  -Z pack. Take 2 tabs on day one followed by 1 tab daily for four additional days. Take with food  Allergen panel today  Chest x ray today.  Follow up in 3 months with Dr. Valeta Harms. If symptoms do not improve or worsen, please contact office for sooner follow up or seek emergency care.    Allergic rhinitis Persistent nasal congestion.  Continue Zyrtec and Flonase.  Add on Astelin for rhinitis/postnasal drainage control.  We will also see if Z-Pak and prednisone to help with the symptoms as well.  Check allergen panel to assess for allergic component of symptoms.  If if there is any evidence of significant allergies, will refer to allergy.    I spent 35 minutes of dedicated to the care of this patient on the date of this encounter to include pre-visit review of records, face-to-face time with the patient discussing conditions above, post visit ordering of testing, clinical documentation with the electronic  health record, making appropriate referrals as documented, and communicating necessary findings to members of the  patients care team.  Clayton Bibles, NP 01/26/2022  Pt aware and understands NP's role.

## 2022-01-26 NOTE — Patient Instructions (Addendum)
Continue Albuterol inhaler 2 puffs or 3 mL neb every 6 hours as needed for shortness of breath or wheezing. Notify if symptoms persist despite rescue inhaler/neb use. Continue Symbicort 2 puffs Twice daily. Brush tongue and rinse mouth afterwards Continue flonase nasal spray 2 sprays each nostril daily Continue singulair 10 mg At bedtime Continue Zyrtec 10 mg daily for allergies  Continue Saline nasal gel in each nostril at bedtime   -Saline nasal spray 2-3 times a day  -Astelin nasal spray 1-2 sprays each nostril Twice daily until nasal congestion improves then as needed -Prednisone 40 mg daily for 5 days. Take in AM with food  -Z pack. Take 2 tabs on day one followed by 1 tab daily for four additional days. Take with food  Allergen panel today  Chest x ray today.  Follow up in 3 months with Dr. Valeta Harms. If symptoms do not improve or worsen, please contact office for sooner follow up or seek emergency care.

## 2022-01-26 NOTE — Telephone Encounter (Signed)
Patient is confused why another nasal spray was called into the pharmacy. Patient thought she was to continue flonase during the day and use the nasal gel at night.  Please advise, call back [phone].

## 2022-01-26 NOTE — Assessment & Plan Note (Addendum)
Acute bronchitis.  Will treat with prednisone burst and Z-Pak.  Recommended that we check CXR to rule out superimposed infection.  Advised on cough control measures.  Do suspect that there is a component of upper airway irritation related to her cough as well.  Advised that she restart Symbicort twice daily.  If stable for 3 months, we can talk about de-escalating to as needed again.  Continue as needed albuterol.  Continue Singulair and Zyrtec for trigger prevention.  Patient Instructions  Continue Albuterol inhaler 2 puffs or 3 mL neb every 6 hours as needed for shortness of breath or wheezing. Notify if symptoms persist despite rescue inhaler/neb use. Continue Symbicort 2 puffs Twice daily. Brush tongue and rinse mouth afterwards Continue flonase nasal spray 2 sprays each nostril daily Continue singulair 10 mg At bedtime Continue Zyrtec 10 mg daily for allergies  Continue Saline nasal gel in each nostril at bedtime   -Saline nasal spray 2-3 times a day  -Astelin nasal spray 1-2 sprays each nostril Twice daily until nasal congestion improves then as needed -Prednisone 40 mg daily for 5 days. Take in AM with food  -Z pack. Take 2 tabs on day one followed by 1 tab daily for four additional days. Take with food  Allergen panel today  Chest x ray today.  Follow up in 3 months with Dr. Valeta Harms. If symptoms do not improve or worsen, please contact office for sooner follow up or seek emergency care.

## 2022-01-26 NOTE — Assessment & Plan Note (Signed)
Persistent nasal congestion.  Continue Zyrtec and Flonase.  Add on Astelin for rhinitis/postnasal drainage control.  We will also see if Z-Pak and prednisone to help with the symptoms as well.  Check allergen panel to assess for allergic component of symptoms.  If if there is any evidence of significant allergies, will refer to allergy.

## 2022-01-27 NOTE — Telephone Encounter (Signed)
Called and spoke with patient. Patient verbalized understanding. Nothing further needed.  

## 2022-01-27 NOTE — Telephone Encounter (Signed)
Called and spoke with patient. Patient is confused as to why another nasal spray was sent to the pharmacy and stated that she isn't going to take it until she knows why [place] prescribed it.   [place], please advise.

## 2022-01-27 NOTE — Telephone Encounter (Signed)
Astelin in combination with flonase is a combination for treatment of persistent rhinitis and should help with her nasal congestion. She should only use it until symptoms improve and then she can use as needed. Nasal gel at night for dryness. Thanks!

## 2022-01-28 ENCOUNTER — Ambulatory Visit: Payer: Medicare Other | Admitting: Cardiovascular Disease

## 2022-02-02 ENCOUNTER — Encounter: Payer: Self-pay | Admitting: Cardiovascular Disease

## 2022-02-22 DIAGNOSIS — F4325 Adjustment disorder with mixed disturbance of emotions and conduct: Secondary | ICD-10-CM | POA: Diagnosis not present

## 2022-02-23 ENCOUNTER — Telehealth: Payer: Self-pay | Admitting: Neurology

## 2022-02-23 ENCOUNTER — Other Ambulatory Visit: Payer: Self-pay | Admitting: Gastroenterology

## 2022-02-23 MED ORDER — AMITRIPTYLINE HCL 50 MG PO TABS: 50.0000 mg | ORAL_TABLET | Freq: Every day | ORAL | 3 refills | Status: DC

## 2022-02-23 NOTE — Telephone Encounter (Signed)
Refill has been sent.  °

## 2022-02-23 NOTE — Telephone Encounter (Signed)
Pt request refill for amitriptyline (ELAVIL) 50 MG tablet at [id number]

## 2022-03-04 DIAGNOSIS — Z0289 Encounter for other administrative examinations: Secondary | ICD-10-CM

## 2022-03-22 ENCOUNTER — Ambulatory Visit (INDEPENDENT_AMBULATORY_CARE_PROVIDER_SITE_OTHER): Payer: Medicare Other | Admitting: Family Medicine

## 2022-03-22 DIAGNOSIS — F4325 Adjustment disorder with mixed disturbance of emotions and conduct: Secondary | ICD-10-CM | POA: Diagnosis not present

## 2022-03-29 ENCOUNTER — Ambulatory Visit (INDEPENDENT_AMBULATORY_CARE_PROVIDER_SITE_OTHER): Payer: Medicare Other | Admitting: Bariatrics

## 2022-04-05 ENCOUNTER — Ambulatory Visit (INDEPENDENT_AMBULATORY_CARE_PROVIDER_SITE_OTHER): Payer: Medicare Other | Admitting: Family Medicine

## 2022-04-05 DIAGNOSIS — F4325 Adjustment disorder with mixed disturbance of emotions and conduct: Secondary | ICD-10-CM | POA: Diagnosis not present
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
## 2022-04-06 ENCOUNTER — Ambulatory Visit (INDEPENDENT_AMBULATORY_CARE_PROVIDER_SITE_OTHER): Payer: Medicare Other | Admitting: Family Medicine

## 2022-04-06 VITALS — BP 114/80 | HR 116 | Ht 62.0 in | Wt 188.6 lb

## 2022-04-06 DIAGNOSIS — G43709 Chronic migraine without aura, not intractable, without status migrainosus: Secondary | ICD-10-CM

## 2022-04-06 MED ORDER — ONABOTULINUMTOXINA 100 UNITS IJ SOLR
155.0000 [IU] | Freq: Once | INTRAMUSCULAR | Status: AC
  Administered 2022-04-06: 155 [IU] via INTRAMUSCULAR

## 2022-04-06 NOTE — Progress Notes (Signed)
Botox- 100 units x 2 vials Lot: [id number] Expiration: 06/2024 NDC: 2060-1561-53  Bacteriostatic 0.9% Sodium Chloride- 30m total Lot: [id number]: 05/07/2022 NDC: [address]-57 Dx: [id number]/B

## 2022-04-12 ENCOUNTER — Ambulatory Visit (INDEPENDENT_AMBULATORY_CARE_PROVIDER_SITE_OTHER): Payer: Medicare Other | Admitting: Bariatrics

## 2022-04-18 ENCOUNTER — Ambulatory Visit: Payer: Medicare Other | Admitting: Family Medicine

## 2022-04-26 ENCOUNTER — Encounter (INDEPENDENT_AMBULATORY_CARE_PROVIDER_SITE_OTHER): Payer: Self-pay | Admitting: Family Medicine

## 2022-04-26 ENCOUNTER — Other Ambulatory Visit (INDEPENDENT_AMBULATORY_CARE_PROVIDER_SITE_OTHER): Payer: Self-pay | Admitting: Family Medicine

## 2022-04-26 ENCOUNTER — Telehealth: Payer: Self-pay | Admitting: Neurology

## 2022-04-26 ENCOUNTER — Ambulatory Visit (INDEPENDENT_AMBULATORY_CARE_PROVIDER_SITE_OTHER): Payer: Medicare Other | Admitting: Family Medicine

## 2022-04-26 VITALS — BP 123/83 | HR 104 | Temp 97.6°F | Ht 63.0 in | Wt 188.0 lb

## 2022-04-26 DIAGNOSIS — E669 Obesity, unspecified: Secondary | ICD-10-CM

## 2022-04-26 DIAGNOSIS — E7800 Pure hypercholesterolemia, unspecified: Secondary | ICD-10-CM

## 2022-04-26 DIAGNOSIS — R0602 Shortness of breath: Secondary | ICD-10-CM

## 2022-04-26 DIAGNOSIS — Z5181 Encounter for therapeutic drug level monitoring: Secondary | ICD-10-CM | POA: Diagnosis not present

## 2022-04-26 DIAGNOSIS — R5383 Other fatigue: Secondary | ICD-10-CM

## 2022-04-26 DIAGNOSIS — Z79899 Other long term (current) drug therapy: Secondary | ICD-10-CM | POA: Diagnosis not present

## 2022-04-26 DIAGNOSIS — Z1331 Encounter for screening for depression: Secondary | ICD-10-CM

## 2022-04-26 DIAGNOSIS — Z6833 Body mass index (BMI) 33.0-33.9, adult: Secondary | ICD-10-CM

## 2022-04-26 DIAGNOSIS — E7849 Other hyperlipidemia: Secondary | ICD-10-CM | POA: Diagnosis not present

## 2022-04-26 DIAGNOSIS — R7303 Prediabetes: Secondary | ICD-10-CM

## 2022-04-26 DIAGNOSIS — I10 Essential (primary) hypertension: Secondary | ICD-10-CM

## 2022-04-26 DIAGNOSIS — G500 Trigeminal neuralgia: Secondary | ICD-10-CM | POA: Diagnosis not present

## 2022-04-26 DIAGNOSIS — G894 Chronic pain syndrome: Secondary | ICD-10-CM | POA: Diagnosis not present

## 2022-04-26 DIAGNOSIS — F32A Depression, unspecified: Secondary | ICD-10-CM | POA: Insufficient documentation

## 2022-04-26 DIAGNOSIS — Z9189 Other specified personal risk factors, not elsewhere classified: Secondary | ICD-10-CM

## 2022-04-26 DIAGNOSIS — F3289 Other specified depressive episodes: Secondary | ICD-10-CM | POA: Diagnosis not present

## 2022-04-26 NOTE — Telephone Encounter (Signed)
I called pt and LMVM that received her message about I think was occipitial neuralgia pain crisis. Was not sure if this was for [place] FYI.  She is seeing pain management for appt and can they write the note she is needing?  Was not sure.  Please return call.
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
## 2022-04-26 NOTE — Telephone Encounter (Signed)
Trigeminal neuralgia started Sunday night and is in crisis now. Have reach out to [facility] Dr Catheryn Bacon for pain management. Bring in for an emergency appt.   Need a letter for disability services at [facility] to verify I have this condition when I have an emergency crisis with Trigeminal Neuralagia and have to leave school

## 2022-04-28 ENCOUNTER — Other Ambulatory Visit: Payer: Self-pay | Admitting: Obstetrics and Gynecology

## 2022-04-28 ENCOUNTER — Other Ambulatory Visit: Payer: Self-pay | Admitting: Nurse Practitioner

## 2022-04-28 DIAGNOSIS — Z803 Family history of malignant neoplasm of breast: Secondary | ICD-10-CM | POA: Diagnosis not present

## 2022-04-28 DIAGNOSIS — Z9189 Other specified personal risk factors, not elsewhere classified: Secondary | ICD-10-CM | POA: Diagnosis not present

## 2022-04-28 DIAGNOSIS — A6000 Herpesviral infection of urogenital system, unspecified: Secondary | ICD-10-CM | POA: Diagnosis not present

## 2022-04-28 DIAGNOSIS — L292 Pruritus vulvae: Secondary | ICD-10-CM | POA: Diagnosis not present

## 2022-04-28 DIAGNOSIS — N393 Stress incontinence (female) (male): Secondary | ICD-10-CM | POA: Diagnosis not present

## 2022-04-28 DIAGNOSIS — M85851 Other specified disorders of bone density and structure, right thigh: Secondary | ICD-10-CM | POA: Diagnosis not present

## 2022-04-28 DIAGNOSIS — N898 Other specified noninflammatory disorders of vagina: Secondary | ICD-10-CM | POA: Diagnosis not present

## 2022-04-28 DIAGNOSIS — Z7251 High risk heterosexual behavior: Secondary | ICD-10-CM | POA: Diagnosis not present

## 2022-04-28 DIAGNOSIS — Z1231 Encounter for screening mammogram for malignant neoplasm of breast: Secondary | ICD-10-CM

## 2022-04-28 DIAGNOSIS — M6289 Other specified disorders of muscle: Secondary | ICD-10-CM | POA: Diagnosis not present

## 2022-04-28 DIAGNOSIS — N952 Postmenopausal atrophic vaginitis: Secondary | ICD-10-CM | POA: Diagnosis not present

## 2022-04-28 DIAGNOSIS — N941 Unspecified dyspareunia: Secondary | ICD-10-CM | POA: Diagnosis not present

## 2022-04-28 LAB — CBC WITH DIFFERENTIAL/PLATELET
Basophils Absolute: 0.1 10*3/uL (ref 0.0–0.2)
Basos: 1 %
EOS (ABSOLUTE): 0.2 10*3/uL (ref 0.0–0.4)
Eos: 3 %
Hematocrit: 40.4 % (ref 34.0–46.6)
Hemoglobin: 13.5 g/dL (ref 11.1–15.9)
Immature Grans (Abs): 0 10*3/uL (ref 0.0–0.1)
Immature Granulocytes: 0 %
Lymphocytes Absolute: 2.2 10*3/uL (ref 0.7–3.1)
Lymphs: 36 %
MCH: 31.5 pg (ref 26.6–33.0)
MCHC: 33.4 g/dL (ref 31.5–35.7)
MCV: 94 fL (ref 79–97)
Monocytes Absolute: 0.4 10*3/uL (ref 0.1–0.9)
Monocytes: 6 %
Neutrophils Absolute: 3.2 10*3/uL (ref 1.4–7.0)
Neutrophils: 54 %
Platelets: 306 10*3/uL (ref 150–450)
RBC: 4.29 x10E6/uL (ref 3.77–5.28)
RDW: 14.3 % (ref 11.7–15.4)
WBC: 6 10*3/uL (ref 3.4–10.8)

## 2022-04-28 LAB — COMPREHENSIVE METABOLIC PANEL
ALT: 17 IU/L (ref 0–32)
AST: 20 IU/L (ref 0–40)
Albumin/Globulin Ratio: 1.8 (ref 1.2–2.2)
Albumin: 4.5 g/dL (ref 3.8–4.9)
Alkaline Phosphatase: 91 IU/L (ref 44–121)
BUN/Creatinine Ratio: 19 (ref 12–28)
BUN: 18 mg/dL (ref 8–27)
Bilirubin Total: <0.2 mg/dL (ref 0.0–1.2)
CO2: 21 mmol/L (ref 20–29)
Calcium: 10.1 mg/dL (ref 8.7–10.3)
Chloride: 100 mmol/L (ref 96–106)
Creatinine, Ser: 0.96 mg/dL (ref 0.57–1.00)
Globulin, Total: 2.5 g/dL (ref 1.5–4.5)
Glucose: 92 mg/dL (ref 70–99)
Potassium: 4.5 mmol/L (ref 3.5–5.2)
Sodium: 139 mmol/L (ref 134–144)
Total Protein: 7 g/dL (ref 6.0–8.5)
eGFR: 68 mL/min/{1.73_m2} (ref >59)

## 2022-04-28 LAB — LIPID PANEL
Chol/HDL Ratio: 3.1 ratio (ref 0.0–4.4)
Cholesterol, Total: 180 mg/dL (ref 100–199)
HDL: 58 mg/dL (ref >39)
LDL Chol Calc (NIH): 89 mg/dL (ref 0–99)
Triglycerides: 195 mg/dL — ABNORMAL HIGH (ref 0–149)
VLDL Cholesterol Cal: 33 mg/dL (ref 5–40)

## 2022-04-28 LAB — VITAMIN B12: Vitamin B-12: 407 pg/mL (ref 232–1245)

## 2022-04-28 LAB — HEMOGLOBIN A1C
Est. average glucose Bld gHb Est-mCnc: 134 mg/dL
Hgb A1c MFr Bld: 6.3 % — ABNORMAL HIGH (ref 4.8–5.6)

## 2022-04-28 LAB — T4, FREE: Free T4: 0.82 ng/dL (ref 0.82–1.77)

## 2022-04-28 LAB — INSULIN, RANDOM: INSULIN: 22 u[IU]/mL (ref 2.6–24.9)

## 2022-04-28 LAB — TSH: TSH: 1.16 u[IU]/mL (ref 0.450–4.500)

## 2022-04-30 ENCOUNTER — Other Ambulatory Visit: Payer: Self-pay | Admitting: Neurology

## 2022-05-02 ENCOUNTER — Ambulatory Visit: Payer: Medicare Other | Admitting: Pulmonary Disease

## 2022-05-02 ENCOUNTER — Encounter: Payer: Self-pay | Admitting: Family Medicine

## 2022-05-02 NOTE — Telephone Encounter (Signed)
Pt asking nurse to read the MyChart message sent today to address Trigeminal Neuralgia.

## 2022-05-02 NOTE — Telephone Encounter (Signed)
I spoke to pt and she will come in for occipital NB tomorrow at 0815 arrival.   She has seen the message.

## 2022-05-02 NOTE — Telephone Encounter (Signed)
Consulted with AL/NP and appt made for her 05-03-2022 at 8:30AM.  Lovey Newcomer RN  (sent via mychart).

## 2022-05-03 ENCOUNTER — Encounter: Payer: Self-pay | Admitting: Family Medicine

## 2022-05-03 ENCOUNTER — Ambulatory Visit (INDEPENDENT_AMBULATORY_CARE_PROVIDER_SITE_OTHER): Payer: No Typology Code available for payment source | Admitting: Family Medicine

## 2022-05-03 VITALS — BP 139/98 | HR 103 | Ht 63.0 in | Wt 188.2 lb

## 2022-05-03 DIAGNOSIS — F4325 Adjustment disorder with mixed disturbance of emotions and conduct: Secondary | ICD-10-CM | POA: Diagnosis not present

## 2022-05-03 DIAGNOSIS — G43709 Chronic migraine without aura, not intractable, without status migrainosus: Secondary | ICD-10-CM | POA: Diagnosis not present

## 2022-05-03 DIAGNOSIS — G500 Trigeminal neuralgia: Secondary | ICD-10-CM | POA: Diagnosis not present

## 2022-05-03 DIAGNOSIS — M5481 Occipital neuralgia: Secondary | ICD-10-CM

## 2022-05-03 MED ORDER — GABAPENTIN 300 MG PO CAPS: 600.0000 mg | ORAL_CAPSULE | Freq: Three times a day (TID) | ORAL | 3 refills | Status: DC

## 2022-05-03 MED ORDER — AJOVY 225 MG/1.5ML ~~LOC~~ SOAJ: 225.0000 mg | SUBCUTANEOUS | 3 refills | Status: DC
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
## 2022-05-03 NOTE — Progress Notes (Signed)
Chief Complaint:   OBESITY Cassidy Olsen (MR# [id number]) is a 61 y.o. female who presents for evaluation and treatment of obesity and related comorbidities. Current BMI is Body mass index is 33.3 kg/m. Cassidy Olsen has been struggling with her weight for many years and has been unsuccessful in either losing weight, maintaining weight loss, or reaching her healthy weight goal.  Cassidy Olsen is currently in the action stage of change and ready to dedicate time achieving and maintaining a healthier weight. Cassidy Olsen is interested in becoming our patient and working on intensive lifestyle modifications including (but not limited to) diet and exercise for weight loss.  Cassidy Olsen was referred by Dr. Alvester Chou. Pt is retired but went back to school at [employer] for a degree in Armed forces logistics/support/administrative officer. Her daughter and granddaughter recently moved in with her. Pt skips breakfast most days and drinks mostly hot tea.  [name]'s habits were reviewed today and are as follows: Her family eats meals together, her desired weight loss is 28 lbs, she started gaining weight in 2006, her heaviest weight ever was 205 pounds, she has significant food cravings issues, she snacks frequently in the evenings, she skips meals frequently, she is frequently drinking liquids with calories, she frequently eats larger portions than normal, and she struggles with emotional eating.  Depression Screen [name]'s Food and Mood (modified PHQ-9) score was 8.     04/26/2022    8:53 AM  Depression screen PHQ 2/9  Decreased Interest 1  Down, Depressed, Hopeless 2  PHQ - 2 Score 3  Altered sleeping 0  Tired, decreased energy 3  Change in appetite 1  Feeling bad or failure about yourself  0  Trouble concentrating 1  Moving slowly or fidgety/restless 0  Suicidal thoughts 0  PHQ-9 Score 8  Difficult doing work/chores Not difficult at all   Subjective:   1. Other fatigue [name] admits to daytime somnolence and admits to waking up still tired.  Patient has a history of symptoms of daytime fatigue and morning fatigue. Cassidy Olsen generally gets 5 hours of sleep per night, and states that she has poor sleep quality. Cassidy Olsen (NO ANSWER) present. Apneic episodes are not present. Epworth Sleepiness Score is 2.   2. SOB (shortness of breath) on exertion [name] notes increasing shortness of breath with exercising and seems to be worsening over time with weight gain. She notes getting out of breath sooner with activity than she used to. This has gotten worse recently. [name] denies shortness of breath at rest or orthopnea.  3. Essential hypertension Cardiovascular ROS: no chest pain or dyspnea on exertion.  4. Other hyperlipidemia Treatment per Dr. Alvester Chou is Lipitor. Pt has familial history. No coronary calcium on CT done 12/29/2021.  5. Prediabetes Pt's last A1c was 5.8 on 12/27/21 with PCP and 6.0 in May 2022.  6. Other depression, with emotional eating Pt sees Dr. Jan [facility]man at [facility] for [facility]. Pt identifies her worst habits as emotional eating and nighttime snacking.  7. Trigeminal neuralgia Dr. Jaynee Eagles treats pt. History of negative sleep study.  8. At risk for heart disease Kamariya is at risk for heart disease due to hypertension, hyperlipidemia, and pre-diabetes.   Assessment/Plan:   Orders Placed This Encounter  Procedures   Vitamin B12   TSH   T4, free   Lipid panel   Insulin, random   Hemoglobin A1c   Comprehensive metabolic panel   CBC with Differential/Platelet    1. Other fatigue [name] does feel that her  weight is causing her energy to be lower than it should be. Fatigue may be related to obesity, depression or many other causes. Labs will be ordered, and in the meanwhile, [name] will focus on self care including making healthy food choices, increasing physical activity and focusing on stress reduction.  Lab/Orders today or future: - Vitamin B12 - TSH - T4, free  2. SOB (shortness of breath)  on exertion Cassidy Olsen does feel that she gets out of breath more easily that she used to when she exercises. Cassidy Olsen's shortness of breath appears to be obesity related and exercise induced. She has agreed to work on weight loss and gradually increase exercise to treat her exercise induced shortness of breath. Will continue to monitor closely.  3. Essential hypertension BP at goal. Cassidy Olsen is working on healthy weight loss and exercise to improve blood pressure control. We will watch for signs of hypotension as she continues her lifestyle modifications.  Lab/Orders today or future: - CBC with Differential/Platelet  4. Other hyperlipidemia Cardiovascular risk and specific lipid/LDL goals reviewed.  We discussed several lifestyle modifications today and Cassidy Olsen will continue to work on diet, exercise and weight loss efforts. Orders and follow up as documented in patient record. Continue meds per cardiology.  Counseling Intensive lifestyle modifications are the first line treatment for this issue. Dietary changes: Increase soluble fiber. Decrease simple carbohydrates. Exercise changes: Moderate to vigorous-intensity aerobic activity 150 minutes per week if tolerated. Lipid-lowering medications: see documented in medical record.  Lab/Orders today or future: - Lipid panel - Comprehensive metabolic panel - CBC with Differential/Platelet  5. Prediabetes Cassidy Olsen will continue to work on weight loss, exercise, and decreasing simple carbohydrates to help decrease the risk of diabetes. Start meal plan. Counseling done. Weight loss encouraged.  Lab/Orders today or future: - Insulin, random - Hemoglobin A1c  6. Other depression, with emotional eating Behavior modification techniques were discussed today to help Cassidy Olsen deal with her emotional/non-hunger eating behaviors.  Orders and follow up as documented in patient record. Referral to Dr. Mallie Mussel placed for emotional eating . Also continue with  counselor as recommended as well.  7. Trigeminal neuralgia Treatment plan per pain management and Dr. Jaynee Eagles.  8. Depression screening Cassidy Olsen had a positive depression screening. Depression is commonly associated with obesity and often results in emotional eating behaviors. We will monitor this closely and work on CBT to help improve the non-hunger eating patterns. Referral to Psychology may be required if no improvement is seen as she continues in our clinic.  9. At risk for heart disease Due to [place] current state of health and medical condition(s), she is at a higher risk for heart disease.  This puts the patient at much greater risk to subsequently develop cardiopulmonary conditions that can significantly affect patient's quality of life in a negative manner as well.    At least 24 minutes were spent on counseling Yasmyn Bellisario about these concerns today, and we discussed the importance of reversing risks factors of obesity, especially truncal and visceral fat, hypertension, hyperlipidemia, and pre-diabetes.  The initial goal is to lose at least 5-10% of starting weight to help reduce these risk factors.  Counseling: Intensive lifestyle modifications were discussed with [name] as the most appropriate first line of treatment.  she will continue to work on diet, exercise, and weight loss efforts.  We will continue to reassess these conditions on a fairly regular basis in an attempt to decrease the patient's overall morbidity and mortality.  Evidence-based interventions  for health behavior change were utilized today including the discussion of self monitoring techniques, problem-solving barriers, and SMART goal setting techniques.  Specifically, regarding patient's less desirable eating habits and patterns, we employed the technique of small changes when Nalee Lightle has not been able to fully commit to her prudent nutritional plan.  10. Class 1 obesity with serious  comorbidity and body mass index (BMI) of 33.0 to 33.9 in adult, unspecified obesity type Janneth is currently in the action stage of change and her goal is to continue with weight loss efforts. I recommend [place] begin the structured treatment plan as follows:  IC = 1771, more than calculated at 1626. She has agreed to the Category 2 Plan.  Exercise goals:  As is    Behavioral modification strategies: increasing lean protein intake, decreasing simple carbohydrates, and planning for success.  She was informed of the importance of frequent follow-up visits to maximize her success with intensive lifestyle modifications for her multiple health conditions. She was informed we would discuss her lab results at her next visit unless there is a critical issue that needs to be addressed sooner. Jaleen agreed to keep her next visit at the agreed upon time to discuss these results.   Objective:   Blood pressure 123/83, pulse (!) 104, temperature 97.6 F (36.4 C), height '5\' 3"'$  (1.6 m), weight 188 lb (85.3 kg), SpO2 99 %. Body mass index is 33.3 kg/m.  EKG: Normal sinus rhythm, rate tachycardia at 115 on 09/24/2021.  Indirect Calorimeter completed today shows a VO2 of 257 and a REE of 1771.  Her calculated basal metabolic rate is 1610 thus her basal metabolic rate is better than expected.  General: Cooperative, alert, well developed, in no acute distress. HEENT: Conjunctivae and lids unremarkable. Cardiovascular: Regular rhythm.  Lungs: Normal work of breathing. Neurologic: No focal deficits.   Lab Results  Component Value Date   CREATININE 0.96 04/26/2022   BUN 18 04/26/2022   NA 139 04/26/2022   K 4.5 04/26/2022   CL 100 04/26/2022   CO2 21 04/26/2022   Lab Results  Component Value Date   ALT 17 04/26/2022   AST 20 04/26/2022   ALKPHOS 91 04/26/2022   BILITOT <0.2 04/26/2022   Lab Results  Component Value Date   HGBA1C 6.3 (H) 04/26/2022   HGBA1C 6.0 (H) 12/21/2020   Lab Results   Component Value Date   INSULIN 22.0 04/26/2022   Lab Results  Component Value Date   TSH 1.160 04/26/2022   Lab Results  Component Value Date   CHOL 180 04/26/2022   HDL 58 04/26/2022   LDLCALC 89 04/26/2022   TRIG 195 (H) 04/26/2022   CHOLHDL 3.1 04/26/2022   Lab Results  Component Value Date   WBC 6.0 04/26/2022   HGB 13.5 04/26/2022   HCT 40.4 04/26/2022   MCV 94 04/26/2022   PLT 306 04/26/2022    Attestation Statements:   Reviewed by clinician on day of visit: allergies, medications, problem list, medical history, surgical history, family history, social history, and previous encounter notes.  I, Kathlene November, BS, CMA, am acting as transcriptionist for [employer], DO.  I have reviewed the above documentation for accuracy and completeness, and I agree with the above. Marjory Sneddon, D.O.  The [place] was signed into law in 2016 which includes the topic of electronic health records.  This provides immediate access to information in MyChart.  This includes consultation notes, operative notes, office notes, lab  results and pathology reports.  If you have any questions about what you read please let us know at your next visit so we can discuss your concerns and take corrective action if need be.  We are right here with you.

## 2022-05-03 NOTE — Progress Notes (Signed)
Nerve block w/o) steroid: Pt signed consentYES  0.5% Bupivocaine 1.5 mL LOT: [id number] EXP: 05/03/2022 NDC:0409-1160-18  2% Lidocaine 1.5 mL LOT: [id number] EXP: 09/2022 NDC: 42395-320-23

## 2022-05-03 NOTE — Progress Notes (Signed)
History: See office note       Bupivicaine injection/Lidocaine protocol for occipital neuralgia   Performed by Debbora Presto, FNP.30-gauge needle was used. All procedures as documented were medically necessary, reasonable and appropriate based on the patient's history, medical diagnosis and physician opinion. Verbal informed consent was obtained from the patient, patient was informed of potential risk of procedure, including bruising, bleeding, hematoma formation, infection, muscle weakness, muscle pain, numbness, transient hypertension, transient hyperglycemia and transient insomnia among others. All areas injected were topically clean with isopropyl rubbing alcohol. Nonsterile nonlatex gloves were worn during the procedure.   1. Greater occipital nerve block [phone]). The greater occipital nerve site was identified at the nuchal line medial to the occipital artery. Medication was injected into the right occipital nerve area and suboccipital areas. Patient's condition is associated with inflammation of the greater occipital nerve and associated multiple groups. Injection was deemed medically necessary, reasonable and appropriate. Injection represents a separate and unique surgical service.    The patient tolerated the injections well, no complications of the procedure were noted. Injections were made with a 30-gauge needle. 3m used of 2% xylocaine and 0.5% bupivacaine mixed 1:1 ratio.   Made any corrections needed, and agree with procedure  ADebbora Presto FNP-C [facility]

## 2022-05-03 NOTE — Progress Notes (Signed)
Letter for school 

## 2022-05-04 ENCOUNTER — Encounter (HOSPITAL_COMMUNITY): Payer: Self-pay

## 2022-05-04 ENCOUNTER — Other Ambulatory Visit: Payer: Self-pay | Admitting: Gastroenterology

## 2022-05-04 ENCOUNTER — Ambulatory Visit (HOSPITAL_COMMUNITY)
Admission: EM | Admit: 2022-05-04 | Discharge: 2022-05-04 | Disposition: A | Discharge status: Home / Self Care | Payer: Medicare Other | Attending: Family Medicine | Admitting: Family Medicine

## 2022-05-04 DIAGNOSIS — N3090 Cystitis, unspecified without hematuria: Secondary | ICD-10-CM | POA: Insufficient documentation

## 2022-05-04 LAB — POCT URINALYSIS DIPSTICK, ED / UC
Bilirubin Urine: NEGATIVE
Glucose, UA: NEGATIVE mg/dL
Ketones, ur: NEGATIVE mg/dL
Nitrite: NEGATIVE
Protein, ur: NEGATIVE mg/dL
Specific Gravity, Urine: 1.01 (ref 1.005–1.030)
Urobilinogen, UA: 0.2 mg/dL (ref 0.0–1.0)
pH: 7 (ref 5.0–8.0)

## 2022-05-04 MED ORDER — PHENAZOPYRIDINE HCL 200 MG PO TABS: 200.0000 mg | ORAL_TABLET | Freq: Three times a day (TID) | ORAL | 0 refills | Status: DC

## 2022-05-04 MED ORDER — SULFAMETHOXAZOLE-TRIMETHOPRIM 800-160 MG PO TABS: 1.0000 | ORAL_TABLET | Freq: Two times a day (BID) | ORAL | 0 refills | Status: AC

## 2022-05-04 NOTE — ED Triage Notes (Signed)
Pt is here for possible UTI, burning , pressure , frequency  and urgency and abdominal pain.x1day

## 2022-05-05 ENCOUNTER — Ambulatory Visit (INDEPENDENT_AMBULATORY_CARE_PROVIDER_SITE_OTHER): Payer: Medicare Other | Admitting: Podiatry

## 2022-05-05 ENCOUNTER — Encounter: Payer: Self-pay | Admitting: Podiatry

## 2022-05-05 DIAGNOSIS — M722 Plantar fascial fibromatosis: Secondary | ICD-10-CM | POA: Diagnosis not present

## 2022-05-05 MED ORDER — TRIAMCINOLONE ACETONIDE 10 MG/ML IJ SUSP
20.0000 mg | Freq: Once | INTRAMUSCULAR | Status: AC
  Administered 2022-05-05: 20 mg

## 2022-05-05 NOTE — Progress Notes (Signed)
Subjective:   Patient ID: Cassidy Olsen, female   DOB: 61 y.o.   MRN: [id number]   HPI Patient states she is developed acute pain in both of her heels over the last approximate month.  States they are very hard to walk on it and both of them are hurting   ROS      Objective:  Physical Exam  Ocular status intact acute discomfort plantar fascia bilateral at the insertion tendon into calcaneus with fluid buildup     Assessment:  Acute plantar fasciitis bilateral with pain     Plan:  H&P reviewed sterile prep injected the fascia at insertion 3 mg Kenalog 5 mg Xylocaine reappoint to recheck and reduce activities at this time

## 2022-05-05 NOTE — ED Provider Notes (Signed)
[place]    ASSESSMENT & PLAN:  1. Cystitis    Begin: Meds ordered this encounter  Medications   phenazopyridine (PYRIDIUM) 200 MG tablet    Sig: Take 1 tablet (200 mg total) by mouth 3 (three) times daily.    Dispense:  6 tablet    Refill:  0   sulfamethoxazole-trimethoprim (BACTRIM DS) 800-160 MG tablet    Sig: Take 1 tablet by mouth 2 (two) times daily for 3 days.    Dispense:  6 tablet    Refill:  0   No signs of pyelonephritis. Urine culture sent.  Will follow up with her PCP or here if not showing improvement over the next 48 hours, sooner if needed.  Outlined signs and symptoms indicating need for more acute intervention. Patient verbalized understanding. After Visit Summary given.  SUBJECTIVE:  Cassidy Olsen is a 61 y.o. female who complains of urinary frequency, urgency and dysuria for the past 1 day. Without associated flank pain, fever, chills, vaginal discharge or bleeding. Gross hematuria: not present. No specific aggravating or alleviating factors reported. No LE edema. Normal PO intake without n/v/d. Without specific abdominal pain. Ambulatory without difficulty. No tx PTA.  LMP: No LMP recorded. Patient has had a hysterectomy.   OBJECTIVE:  Vitals:   05/04/22 2028 05/04/22 2031  BP: 121/77   Pulse: (!) 112   Resp: 12   Temp: 98.4 F (36.9 C)   TempSrc: Oral   SpO2: 99%   Weight:  83.9 kg  Height:  '5\' 3"'  (1.6 m)   Tachycardia noted. Regular.  General appearance: alert; no distress HENT: oropharynx: moist Lungs: unlabored respirations Abdomen: soft, non-tender; bowel sounds normal; no masses or organomegaly; no guarding or rebound tenderness Back: no CVA tenderness Extremities: no edema; symmetrical with no gross deformities Skin: warm and dry Neurologic: normal gait Psychological: alert and cooperative; normal mood and affect  Labs Reviewed  POCT URINALYSIS DIPSTICK, ED / UC - Abnormal; Notable for the following  components:      Result Value   Hgb urine dipstick MODERATE (*)    Leukocytes,Ua MODERATE (*)    All other components within normal limits  URINE CULTURE    Allergies  Allergen Reactions   Aspirin Other (See Comments), Nausea And Vomiting and Nausea Only    Stomach upset Avoids due to IBS Due to ibs Stomach upset Due to ibs Avoids due to IBS   Ketorolac Tromethamine Shortness Of Breath, Itching and Other (See Comments)    IM administration ONLY  REDNESS IM administration ONLY  REDNESS  SWELLING OF THE THROAT   ORAL TORADOL CAUSED EXTREME REDNESS, ITCHINESS, DIFFICULTY BREATHING, AND FACIAL SWELLING. PATIENT ALSO DIAGNOSED WITH BELL'S PALSY.   Penicillins Hives, Itching, Rash and Other (See Comments)    Did it involve swelling of the face/tongue/throat, SOB, or low BP? No Did it involve sudden or severe rash/hives, skin peeling, or any reaction on the inside of your mouth or nose? Yes Did you need to seek medical attention at a hospital or doctor's office? No When did it last happen?      2000 If all above answers are "NO", may proceed with cephalosporin use.  Vaginal rash Vaginal rash Did it involve swelling of the face/tongue/throat, SOB, or low BP? No Did it involve sudden or severe rash/hives, skin peeling, or any reaction on the inside of your mouth or nose? Yes Did you need to seek medical attention at a hospital or doctor's office? No When did  it last happen?      2000 If all above answers are "NO", may proceed with cephalosporin use. Vaginal rash Vaginal rash   Codeine Other (See Comments)    Cough meds with codeine-have withdraw symptoms when done Cough meds with codeine-have withdraw symptoms when done Cough meds with codeine-have withdraw symptoms when done   Ibuprofen Other (See Comments) and Nausea And Vomiting    Stomach upset Avoids due to IBS Stomach upset Avoids due to IBS   Latex Rash and Other (See Comments)    Vaginal irritation Vaginal  irritation Exacerbates genital herpes Exacerbates genital herpes   Omeprazole Magnesium Other (See Comments)    Lack of therapeutic effect Lack of therapeutic effect   Pantoprazole Sodium Other (See Comments)    Lack of therapeutic effect Lack of therapeutic effect   Penicillamine Rash   Pregabalin Other (See Comments)    Weight gain Weight gain    Past Medical History:  Diagnosis Date   ADHD    Arthritis 2010   Asthma    Back pain    Chronic headaches    Chronic pain    Constipation    Edema of both lower extremities    Family history of breast cancer    Family history of prostate cancer    Gallstones 2018   GERD (gastroesophageal reflux disease)    High cholesterol    History of kidney stones    Hx: UTI (urinary tract infection)    Hypertension    IBS (irritable bowel syndrome)    Interstitial cystitis 2012   Joint pain    Kidney problem    Migraine    Occipital neuralgia    Osteoarthritis    PNA (pneumonia) 2018   Pre-diabetes    Spinal stenosis    Trigeminal neuralgia    Trigeminal neuralgia    Social History   Socioeconomic History   Marital status: Single    Spouse name: Not on file   Number of children: 1   Years of education: Not on file   Highest education level: Not on file  Occupational History   Occupation: retired   Occupation: Ship broker  Tobacco Use   Smoking status: Never   Smokeless tobacco: Never  Vaping Use   Vaping Use: Never used  Substance and Sexual Activity   Alcohol use: Yes    Comment: socially   Drug use: No   Sexual activity: Not on file  Other Topics Concern   Not on file  Social History Narrative   Not on file   Social Determinants of Health   Financial Resource Strain: Not on file  Food Insecurity: Not on file  Transportation Needs: Not on file  Physical Activity: Not on file  Stress: Not on file  Social Connections: Not on file  Intimate Partner Violence: Not on file   Family History  Problem Relation Age  of Onset   Ulcers Mother 60       peptic ulcer   Irritable bowel syndrome Mother    High blood pressure Mother    Sudden death Mother    Depression Mother    Prostate cancer Father 39   Heart disease Father    Irritable bowel syndrome Father    High blood pressure Father    Breast cancer Sister 62       ER+/PR+/Her2- breast cancer   Alzheimer's disease Sister    Breast cancer Paternal Grandmother    Diabetes Paternal Grandmother    Cancer Paternal Uncle  unknown cancers        Vanessa Kick, MD 05/05/22 [phone]

## 2022-05-06 ENCOUNTER — Telehealth (HOSPITAL_COMMUNITY): Payer: Self-pay | Admitting: Emergency Medicine

## 2022-05-06 LAB — URINE CULTURE: Culture: 60000 — AB

## 2022-05-06 MED ORDER — NITROFURANTOIN MONOHYD MACRO 100 MG PO CAPS: 100.0000 mg | ORAL_CAPSULE | Freq: Two times a day (BID) | ORAL | 0 refills | Status: DC

## 2022-05-09 ENCOUNTER — Telehealth (INDEPENDENT_AMBULATORY_CARE_PROVIDER_SITE_OTHER): Payer: Medicare Other | Admitting: Psychology

## 2022-05-09 DIAGNOSIS — F5089 Other specified eating disorder: Secondary | ICD-10-CM | POA: Diagnosis not present

## 2022-05-09 DIAGNOSIS — F331 Major depressive disorder, recurrent, moderate: Secondary | ICD-10-CM

## 2022-05-10 ENCOUNTER — Ambulatory Visit (INDEPENDENT_AMBULATORY_CARE_PROVIDER_SITE_OTHER): Payer: Medicare Other | Admitting: Family Medicine

## 2022-05-10 ENCOUNTER — Encounter (INDEPENDENT_AMBULATORY_CARE_PROVIDER_SITE_OTHER): Payer: Self-pay | Admitting: Family Medicine

## 2022-05-10 ENCOUNTER — Telehealth (HOSPITAL_COMMUNITY): Payer: Self-pay | Admitting: Emergency Medicine

## 2022-05-10 VITALS — BP 118/89 | HR 115 | Temp 98.4°F | Ht 63.0 in | Wt 178.0 lb

## 2022-05-10 DIAGNOSIS — R7303 Prediabetes: Secondary | ICD-10-CM | POA: Diagnosis not present

## 2022-05-10 DIAGNOSIS — E7849 Other hyperlipidemia: Secondary | ICD-10-CM | POA: Diagnosis not present

## 2022-05-10 DIAGNOSIS — F39 Unspecified mood [affective] disorder: Secondary | ICD-10-CM

## 2022-05-10 DIAGNOSIS — Z6831 Body mass index (BMI) 31.0-31.9, adult: Secondary | ICD-10-CM | POA: Diagnosis not present

## 2022-05-10 DIAGNOSIS — E669 Obesity, unspecified: Secondary | ICD-10-CM | POA: Diagnosis not present

## 2022-05-10 DIAGNOSIS — I10 Essential (primary) hypertension: Secondary | ICD-10-CM

## 2022-05-10 DIAGNOSIS — E782 Mixed hyperlipidemia: Secondary | ICD-10-CM

## 2022-05-10 MED ORDER — FLUCONAZOLE 150 MG PO TABS: 150.0000 mg | ORAL_TABLET | Freq: Once | ORAL | 0 refills | Status: AC

## 2022-05-10 NOTE — Telephone Encounter (Signed)
Patient requested antibiotic associated yeast medicine, sent per protocol 

## 2022-05-11 DIAGNOSIS — G501 Atypical facial pain: Secondary | ICD-10-CM | POA: Diagnosis not present

## 2022-05-11 DIAGNOSIS — G894 Chronic pain syndrome: Secondary | ICD-10-CM | POA: Diagnosis not present

## 2022-05-11 DIAGNOSIS — G500 Trigeminal neuralgia: Secondary | ICD-10-CM | POA: Diagnosis not present

## 2022-05-17 ENCOUNTER — Encounter: Payer: Self-pay | Admitting: Family Medicine

## 2022-05-21 ENCOUNTER — Other Ambulatory Visit: Payer: Self-pay | Admitting: Gastroenterology

## 2022-05-21 DIAGNOSIS — K5904 Chronic idiopathic constipation: Secondary | ICD-10-CM

## 2022-05-23 ENCOUNTER — Inpatient Hospital Stay: Admission: RE | Admit: 2022-05-23 | Payer: Medicare Other | Source: Ambulatory Visit

## 2022-05-23 DIAGNOSIS — I10 Essential (primary) hypertension: Secondary | ICD-10-CM | POA: Diagnosis not present

## 2022-05-23 DIAGNOSIS — E782 Mixed hyperlipidemia: Secondary | ICD-10-CM | POA: Insufficient documentation

## 2022-05-23 DIAGNOSIS — N898 Other specified noninflammatory disorders of vagina: Secondary | ICD-10-CM | POA: Diagnosis not present

## 2022-05-23 DIAGNOSIS — R3989 Other symptoms and signs involving the genitourinary system: Secondary | ICD-10-CM | POA: Diagnosis not present

## 2022-05-23 DIAGNOSIS — J4520 Mild intermittent asthma, uncomplicated: Secondary | ICD-10-CM | POA: Diagnosis not present

## 2022-05-23 DIAGNOSIS — F39 Unspecified mood [affective] disorder: Secondary | ICD-10-CM | POA: Insufficient documentation

## 2022-05-23 DIAGNOSIS — N952 Postmenopausal atrophic vaginitis: Secondary | ICD-10-CM | POA: Diagnosis not present

## 2022-05-24 ENCOUNTER — Ambulatory Visit (INDEPENDENT_AMBULATORY_CARE_PROVIDER_SITE_OTHER): Payer: Medicare Other | Admitting: Family Medicine

## 2022-05-24 ENCOUNTER — Encounter (INDEPENDENT_AMBULATORY_CARE_PROVIDER_SITE_OTHER): Payer: Self-pay | Admitting: Family Medicine

## 2022-05-24 ENCOUNTER — Telehealth (INDEPENDENT_AMBULATORY_CARE_PROVIDER_SITE_OTHER): Payer: Medicare Other | Admitting: Psychology

## 2022-05-24 VITALS — BP 121/85 | HR 111 | Temp 98.9°F | Ht 63.0 in | Wt 183.0 lb

## 2022-05-24 DIAGNOSIS — F5089 Other specified eating disorder: Secondary | ICD-10-CM | POA: Diagnosis not present

## 2022-05-24 DIAGNOSIS — Z6832 Body mass index (BMI) 32.0-32.9, adult: Secondary | ICD-10-CM

## 2022-05-24 DIAGNOSIS — F331 Major depressive disorder, recurrent, moderate: Secondary | ICD-10-CM | POA: Diagnosis not present

## 2022-05-24 DIAGNOSIS — E669 Obesity, unspecified: Secondary | ICD-10-CM | POA: Diagnosis not present

## 2022-05-24 MED ORDER — GABAPENTIN 300 MG PO CAPS: 900.0000 mg | ORAL_CAPSULE | Freq: Three times a day (TID) | ORAL | 0 refills | Status: AC

## 2022-05-24 NOTE — Progress Notes (Signed)
  Office: [phone]  /  Fax: [phone]    Date: May 24, 2022    Appointment Start Time: 10:01am Duration: 32 minutes Provider: Glennie Isle, Psy.D. Type of Session: Individual Therapy  Location of Patient: Home (private location) Location of Provider: Provider's Home (private office) Type of Contact: Telepsychological Visit via MyChart Video Visit  Session Content: Suhana is a 61 y.o. female presenting for a follow-up appointment to address the previously established treatment goal of increasing coping skills.Today's appointment was a telepsychological visit. Zeyna provided verbal consent for today's telepsychological appointment and she is aware she is responsible for securing confidentiality on her end of the session. Prior to proceeding with today's appointment, Tapanga's physical location at the time of this appointment was obtained as well a phone number she could be reached at in the event of technical difficulties. Ana and this provider participated in today's telepsychological service.   This provider conducted a brief check-in. Bethanee shared, "Overall, I'm feeling hopeful about this journey." She also shared making the decision to cease going to school at this time due to ongoing stressors, noting it resulted in emotional eating behaviors. Despite deviations, Maribeth recalled journaling consistently. This provider checked-in regarding establishing care for traditional therapeutic services. The e-mail previously shared with referrals was re-sent with Kieli's verbal consent. She noted she contacted some referral options shared by her PCP. Reviewed emotional vs. physical hunger. Psychoeducation regarding triggers for emotional eating was provided. Baylie was provided a handout, and encouraged to utilize the handout between now and the next appointment to increase awareness of triggers and frequency. Regene agreed. This provider also discussed behavioral strategies for  specific triggers, such as placing the utensil down when conversing to avoid mindless eating. Zahirah provided verbal consent during today's appointment for this provider to send a handout about triggers via e-mail. Kavina did not endorse experiencing suicidal, self-injurious, and homicidal ideation, plan, or intent since the last appointment with this provider and described future plans that include ongoing efforts to improve her well-being and eating habits. Overall, Baylen was receptive to today's appointment as evidenced by openness to sharing, responsiveness to feedback, and willingness to explore triggers for emotional eating.  Mental Status Examination:  Appearance: neat Behavior: appropriate to circumstances Mood: neutral Affect: mood congruent Speech: WNL Eye Contact: appropriate Psychomotor Activity: WNL Gait: unable to assess Thought Process: linear, logical, and goal directed and no evidence or endorsement of suicidal, homicidal, and self-harm ideation, plan and intent  Thought Content/Perception: no hallucinations, delusions, bizarre thinking or behavior endorsed or observed Orientation: AAOx4 Memory/Concentration: memory, attention, language, and fund of knowledge intact  Insight: fair Judgment: fair  Interventions:  Conducted a brief chart review Provided empathic reflections and validation Reviewed content from the previous session Employed supportive psychotherapy interventions to facilitate reduced distress and to improve coping skills with identified stressors Psychoeducation provided regarding triggers for emotional eating behaviors  DSM-5 Diagnosis(es):  F50.89 Other Specified Feeding or Eating Disorder, Emotional Eating Behaviors and F33.1 Major Depressive Disorder, Recurrent Episode, Moderate, With Anxious Distress  Treatment Goal & Progress: During the initial appointment with this provider, the following treatment goal was established: increase coping skills.  Progress is limited, as Theresea has just begun treatment with this provider; however, she is receptive to the interaction and interventions and rapport is being established.   Plan: The next appointment is scheduled for 06/07/2022 at 10am, which will be via MyChart Video Visit. The next session will focus on working towards the established treatment goal.

## 2022-05-26 ENCOUNTER — Ambulatory Visit: Payer: Medicare Other

## 2022-05-28 ENCOUNTER — Other Ambulatory Visit: Payer: Self-pay | Admitting: Gastroenterology

## 2022-06-01 ENCOUNTER — Ambulatory Visit: Payer: Medicare Other | Admitting: Podiatry

## 2022-06-01 DIAGNOSIS — G500 Trigeminal neuralgia: Secondary | ICD-10-CM | POA: Diagnosis not present

## 2022-06-01 DIAGNOSIS — G894 Chronic pain syndrome: Secondary | ICD-10-CM | POA: Diagnosis not present

## 2022-06-02 ENCOUNTER — Ambulatory Visit (INDEPENDENT_AMBULATORY_CARE_PROVIDER_SITE_OTHER): Payer: Medicare Other | Admitting: Pulmonary Disease

## 2022-06-02 ENCOUNTER — Encounter: Payer: Self-pay | Admitting: Pulmonary Disease

## 2022-06-02 VITALS — BP 112/74 | HR 102 | Temp 98.0°F | Ht 63.0 in | Wt 184.4 lb

## 2022-06-02 DIAGNOSIS — J302 Other seasonal allergic rhinitis: Secondary | ICD-10-CM | POA: Diagnosis not present

## 2022-06-02 DIAGNOSIS — R058 Other specified cough: Secondary | ICD-10-CM | POA: Diagnosis not present

## 2022-06-02 DIAGNOSIS — Z6832 Body mass index (BMI) 32.0-32.9, adult: Secondary | ICD-10-CM

## 2022-06-02 DIAGNOSIS — J4531 Mild persistent asthma with (acute) exacerbation: Secondary | ICD-10-CM

## 2022-06-02 MED ORDER — ALBUTEROL SULFATE HFA 108 (90 BASE) MCG/ACT IN AERS: 2.0000 | INHALATION_SPRAY | Freq: Four times a day (QID) | RESPIRATORY_TRACT | 6 refills | Status: AC | PRN

## 2022-06-02 NOTE — Patient Instructions (Addendum)
Thank you for visiting Dr. Valeta Harms at [facility]. Today we recommend the following:  Continue symbicort  Continue singulair Continue albuterol, new refill sent   Return in about 1 year (around 06/03/2023) for with APP or Dr. Valeta Harms.    Please do your part to reduce the spread of COVID-19.

## 2022-06-02 NOTE — Progress Notes (Signed)
Synopsis: Referred in September 2019 for asthma by Drue Flirt, MD  Subjective:   PATIENT ID: Cassidy Olsen GENDER: female DOB: 1961-07-03, MRN: [id number]  Chief Complaint  Patient presents with   Follow-up    Asthma and allergy flare.  Some SOB.  Changed filter in home.  Sx improved   Seen initially for SOB. First week of august she was sick, cough, fevers, chills. She was treated with azithromycin. She has pain her left chest that she describes as pleuritic. The pain lasted for about 3 weeks. 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 The MRI revealed an abnormality in the chest.   She started to feel some better. She still has a dry cough. Her SOB is better and no issue with climbing stairs. Of note, she has had several bouts of bronchitis. Routinely, usually 1-2 times per year with bronchitis. She was told that she may have chronic bronchitis. Patient does have seasonal allergies.  She does use Flonase for allergic rhinitis.  This is usually worse in the fall and early spring time.  She denies aspirin and sensitivity.  She does have a history of asthma.  She had episodic wheezing diagnosed in her mid 65s.  She stated she was much heavier at this time.  And once losing significant amount awake her respiratory symptoms improved.  She does not have any pets, birds, dogs or cats in the home.  She is retired and works Retail buyer.She was started on Symbicort as well as as needed albuterol by her primary care provider which states has improved her symptoms.  She has not been terribly compliant with the use of Symbicort.  OV 05/30/2018: She feels like she is breathing better. She has been using symbicort regularly as well as the singulair. She does feel like this is her worst time of year. She does feel as if the medications are maybe helping. Since our last visit she had a PET scan completed for evaluation of the lung nodule found on original CT imaging.  The PET  scan showed near complete resolution of this area of the lung.  She does have a small area of groundglass remaining.  As for her bronchial symptoms she still has occasional chest tightness.  But overall has significantly improved on Symbicort.  Patient denies chest pain, fevers, sputum production today.  OV 05/13/2019: Patient was seen in June 2020 by ENT for chronic cough.  Patient was given behavioral modifications to help stop the constant throat clearing sensations.  Overall she has been doing well since her last office visit.  I have not seen her in nearly a year now.  With her current medication regimen of Singulair, Symbicort, albuterol, Flonase, antihistamine her allergy symptoms and breathing symptoms have significantly improved.  She did have a bout back in the springtime which she saw Dr. Melvyn Novas.  Overall, she needs refills of her medications today.  She does feel like she is not always compliant with her Symbicort and has been using it more as needed recently.  She did state that the fall was her most common season for significant allergy symptoms.  I encouraged her to restart her Symbicort during this time to help improve some of her symptoms.  We reviewed her pulmonary function test that was completed last week.  A FEV1 of 2.5 L, 120% predicted no significant bronchodilator response, RV 159%, RV/TLC ratio of 130, DLCO 109.  OV 06/08/2020: This is a 61 year old female  here for 1 year follow-up for management of mild persistent asthma.  She does note that she has for the past 3 to 5 days upper respiratory tract symptoms.  Sore throat congestion.  Was seen in urgent care recommended symptomatic therapy.  She also had a COVID-19 test that was ordered.  At the time of the office visit the test did not return however during her office visit she received a telephone call stating that her COVID-19 test was negative.  She does state that she is slowly starting to feel a little better.  Overall denies fevers  chills night sweats.  She went to go get a Covid test because she recently just got back from [place] where she went to [place] for a concert.  She is fully vaccinated.  As for her asthma symptoms well managed with Symbicort.  She recently through the summer had only been using it as needed but as the season change she had increased to regular use of her inhaler regimen.  She continues to maintain on antihistamines plus Singulair plus Flonase.  Her cough has overall resolved since she was last seen by [place].  OV 09/15/2021: Last seen by me in November 2021.  Here today for follow-up. From a respiratory stand point. Doing well. Recently went back to school at [employer] for [employer]. COVID 19 back in decemeber. She was using her inhaler then.  Here today for mild intermittent asthma follow-up.  Not routinely using her Symbicort.  She does use it as needed.  Also using her albuterol as needed.  She does that she use it this morning.  OV 06/02/2022: Here today for 1 year follow-up.  Patient doing well with no complaints today.  Using her Symbicort as needed she uses it most of the time regularly but occasionally forgets.  Using her Singulair daily.  She needs new prescription for albuterol.      Past Medical History:  Diagnosis Date   ADHD    Arthritis 2010   Asthma    Back pain    Chronic headaches    Chronic pain    Constipation    Edema of both lower extremities    Family history of breast cancer    Family history of prostate cancer    Gallstones 2018   GERD (gastroesophageal reflux disease)    High cholesterol    History of kidney stones    Hx: UTI (urinary tract infection)    Hypertension    IBS (irritable bowel syndrome)    Interstitial cystitis 2012   Joint pain    Kidney problem    Migraine    Occipital neuralgia    Osteoarthritis    PNA (pneumonia) 2018   Pre-diabetes    Spinal stenosis    Trigeminal neuralgia    Trigeminal neuralgia      Family History  Problem  Relation Age of Onset   Ulcers Mother 76       peptic ulcer   Irritable bowel syndrome Mother    High blood pressure Mother    Sudden death Mother    Depression Mother    Prostate cancer Father 40   Heart disease Father    Irritable bowel syndrome Father    High blood pressure Father    Breast cancer Sister 51       ER+/PR+/Her2- breast cancer   Alzheimer's disease Sister    Breast cancer Paternal Grandmother    Diabetes Paternal Grandmother    Cancer Paternal Uncle  unknown cancers     Social History   Socioeconomic History   Marital status: Single    Spouse name: Not on file   Number of children: 1   Years of education: Not on file   Highest education level: Not on file  Occupational History   Occupation: retired   Occupation: Ship broker  Tobacco Use   Smoking status: Never   Smokeless tobacco: Never  Vaping Use   Vaping Use: Never used  Substance and Sexual Activity   Alcohol use: Yes    Comment: socially   Drug use: No   Sexual activity: Not on file  Other Topics Concern   Not on file  Social History Narrative   Not on file   Social Determinants of Health   Financial Resource Strain: Not on file  Food Insecurity: Not on file  Transportation Needs: Not on file  Physical Activity: Not on file  Stress: Not on file  Social Connections: Not on file  Intimate Partner Violence: Not on file     Allergies  Allergen Reactions   Aspirin Other (See Comments), Nausea And Vomiting and Nausea Only    Stomach upset Avoids due to IBS Due to ibs Stomach upset Due to ibs Avoids due to IBS   Ketorolac Tromethamine Shortness Of Breath, Itching and Other (See Comments)    IM administration ONLY  REDNESS IM administration ONLY  REDNESS  SWELLING OF THE THROAT   ORAL TORADOL CAUSED EXTREME REDNESS, ITCHINESS, DIFFICULTY BREATHING, AND FACIAL SWELLING. PATIENT ALSO DIAGNOSED WITH BELL'S PALSY.   Penicillins Hives, Itching, Rash and Other (See Comments)    Did  it involve swelling of the face/tongue/throat, SOB, or low BP? No Did it involve sudden or severe rash/hives, skin peeling, or any reaction on the inside of your mouth or nose? Yes Did you need to seek medical attention at a hospital or doctor's office? No When did it last happen?      2000 If all above answers are "NO", may proceed with cephalosporin use.  Vaginal rash Vaginal rash Did it involve swelling of the face/tongue/throat, SOB, or low BP? No Did it involve sudden or severe rash/hives, skin peeling, or any reaction on the inside of your mouth or nose? Yes Did you need to seek medical attention at a hospital or doctor's office? No When did it last happen?      2000 If all above answers are "NO", may proceed with cephalosporin use. Vaginal rash Vaginal rash   Codeine Other (See Comments)    Cough meds with codeine-have withdraw symptoms when done Cough meds with codeine-have withdraw symptoms when done Cough meds with codeine-have withdraw symptoms when done   Ibuprofen Other (See Comments) and Nausea And Vomiting    Stomach upset Avoids due to IBS Stomach upset Avoids due to IBS   Latex Rash and Other (See Comments)    Vaginal irritation Vaginal irritation Exacerbates genital herpes Exacerbates genital herpes   Omeprazole Magnesium Other (See Comments)    Lack of therapeutic effect Lack of therapeutic effect   Pantoprazole Sodium Other (See Comments)    Lack of therapeutic effect Lack of therapeutic effect   Penicillamine Rash   Pregabalin Other (See Comments)    Weight gain Weight gain   Pyridium [Phenazopyridine Hcl] Swelling    Lip swelling and burning     Outpatient Medications Prior to Visit  Medication Sig Dispense Refill   ACETAMINOPHEN PO Take 650 mg by mouth every 8 (eight) hours as needed  for moderate pain.     albuterol (VENTOLIN HFA) 108 (90 Base) MCG/ACT inhaler Inhale 2 puffs into the lungs every 6 (six) hours as needed for wheezing or shortness of  breath. 18 g 11   AMBULATORY NON FORMULARY MEDICATION 1 tablet 2 (two) times daily. Medication Name: Migrelief     amitriptyline (ELAVIL) 50 MG tablet Take 1 tablet (50 mg total) by mouth at bedtime. 90 tablet 3   budesonide-formoterol (SYMBICORT) 80-4.5 MCG/ACT inhaler Inhale 2 puffs into the lungs 2 (two) times daily as needed (asthma). 1 each 11   celecoxib (CELEBREX) 200 MG capsule Take 200 mg by mouth 2 (two) times daily.     dexlansoprazole (DEXILANT) 60 MG capsule Take 1 capsule (60 mg total) by mouth daily. 30 capsule 11   dicyclomine (BENTYL) 10 MG capsule TAKE 1 TO 2 CAPSULES BY MOUTH EVERY 8 HOURS AS NEEDED 540 capsule 0   fluticasone (FLONASE) 50 MCG/ACT nasal spray Place 2 sprays into both nostrils daily. 16 g 11   Fremanezumab-vfrm (AJOVY) 225 MG/1.5ML SOAJ Inject 225 mg into the skin every 30 (thirty) days. 4.5 mL 3   gabapentin (NEURONTIN) 300 MG capsule Take 2 capsules (600 mg total) by mouth 3 (three) times daily. 540 capsule 3   gabapentin (NEURONTIN) 300 MG capsule Take 3 capsules (900 mg total) by mouth 3 (three) times daily. 270 capsule 0   HYDROCHLOROTHIAZIDE PO Take 1 tablet by mouth daily.     lisinopril (ZESTRIL) 10 MG tablet Take 10 mg by mouth daily.     lubiprostone (AMITIZA) 24 MCG capsule TAKE 1 CAPSULE (24 MCG TOTAL) BY MOUTH 2 (TWO) TIMES DAILY WITH A MEAL. 180 capsule 0   montelukast (SINGULAIR) 10 MG tablet Take 1 tablet (10 mg total) by mouth at bedtime. 30 tablet 11   Spacer/Aero-Holding Chambers (AEROCHAMBER MV) inhaler Use as instructed 1 each 0   valACYclovir (VALTREX) 500 MG tablet Take 500 mg by mouth 2 (two) times daily as needed (outbreaks).      atorvastatin (LIPITOR) 20 MG tablet Take 1 tablet (20 mg total) by mouth daily. 90 tablet 4   nitrofurantoin, macrocrystal-monohydrate, (MACROBID) 100 MG capsule Take 1 capsule (100 mg total) by mouth 2 (two) times daily. (Patient not taking: Reported on 06/02/2022) 10 capsule 0   phenazopyridine (PYRIDIUM) 200  MG tablet Take 1 tablet (200 mg total) by mouth 3 (three) times daily. (Patient not taking: Reported on 06/02/2022) 6 tablet 0   valACYclovir (VALTREX) 1000 MG tablet Take 1 tablet (1,000 mg total) by mouth 3 (three) times daily. (Patient not taking: Reported on 06/02/2022) 21 tablet 0   No facility-administered medications prior to visit.    Review of Systems  Constitutional:  Negative for chills, fever, malaise/fatigue and weight loss.  HENT:  Negative for hearing loss, sore throat and tinnitus.   Eyes:  Negative for blurred vision and double vision.  Respiratory:  Positive for shortness of breath. Negative for cough, hemoptysis, sputum production, wheezing and stridor.   Cardiovascular:  Negative for chest pain, palpitations, orthopnea, leg swelling and PND.  Gastrointestinal:  Negative for abdominal pain, constipation, diarrhea, heartburn, nausea and vomiting.  Genitourinary:  Negative for dysuria, hematuria and urgency.  Musculoskeletal:  Negative for joint pain and myalgias.  Skin:  Negative for itching and rash.  Neurological:  Negative for dizziness, tingling, weakness and headaches.  Endo/Heme/Allergies:  Negative for environmental allergies. Does not bruise/bleed easily.  Psychiatric/Behavioral:  Negative for depression. The patient is not nervous/anxious and  does not have insomnia.   All other systems reviewed and are negative.    Objective:  Physical Exam Vitals reviewed.  Constitutional:      General: She is not in acute distress.    Appearance: She is well-developed. She is obese.  HENT:     Head: Normocephalic and atraumatic.  Eyes:     General: No scleral icterus.    Conjunctiva/sclera: Conjunctivae normal.     Pupils: Pupils are equal, round, and reactive to light.  Neck:     Vascular: No JVD.     Trachea: No tracheal deviation.  Cardiovascular:     Rate and Rhythm: Normal rate and regular rhythm.     Heart sounds: Normal heart sounds. No murmur  heard. Pulmonary:     Effort: Pulmonary effort is normal. No tachypnea, accessory muscle usage or respiratory distress.     Breath sounds: No stridor. No wheezing, rhonchi or rales.  Abdominal:     General: There is no distension.     Palpations: Abdomen is soft.     Tenderness: There is no abdominal tenderness.  Musculoskeletal:        General: No tenderness.     Cervical back: Neck supple.  Lymphadenopathy:     Cervical: No cervical adenopathy.  Skin:    General: Skin is warm and dry.     Capillary Refill: Capillary refill takes less than 2 seconds.     Findings: No rash.  Neurological:     Mental Status: She is alert and oriented to person, place, and time.  Psychiatric:        Behavior: Behavior normal.      Vitals:   06/02/22 1157  BP: 112/74  Pulse: (!) 102  Temp: 98 F (36.7 C)  TempSrc: Oral  SpO2: 99%  Weight: 184 lb 6.4 oz (83.6 kg)  Height: '5\' 3"'  (1.6 m)   99% on RA BMI Readings from Last 3 Encounters:  06/02/22 32.66 kg/m  05/24/22 32.42 kg/m  05/10/22 31.53 kg/m   Wt Readings from Last 3 Encounters:  06/02/22 184 lb 6.4 oz (83.6 kg)  05/24/22 183 lb (83 kg)  05/10/22 178 lb (80.7 kg)   CBC    Component Value Date/Time   WBC 6.0 04/26/2022 1050   WBC 9.0 12/21/2020 1427   RBC 4.29 04/26/2022 1050   RBC 4.16 12/21/2020 1427   HGB 13.5 04/26/2022 1050   HCT 40.4 04/26/2022 1050   PLT 306 04/26/2022 1050   MCV 94 04/26/2022 1050   MCH 31.5 04/26/2022 1050   MCH 31.3 12/21/2020 1427   MCHC 33.4 04/26/2022 1050   MCHC 33.3 12/21/2020 1427   RDW 14.3 04/26/2022 1050   LYMPHSABS 2.2 04/26/2022 1050   MONOABS 0.6 11/18/2016 2303   EOSABS 0.2 04/26/2022 1050   BASOSABS 0.1 04/26/2022 1050    Regional Allergy Panel 04/18/2018 - Negative  IgE: 47  Chest Imaging:  CXR 04/17/2018: Left sided nodular density   Nuclear medicine PET scan: 05/21/2018: SUV max 2.2 left lower lobe bandlike opacity seen on prior CT imaging.  Much improved the  last nodular masslike appearance. 4 mm right ureteropelvic junction calculus, cystic left adnexal mass no metabolic activity.   Pulmonary Functions Testing Results:    Latest Ref Rng & Units 05/03/2019   12:03 PM  PFT Results  FVC-Pre L 3.14   FVC-Predicted Pre % 119   FVC-Post L 3.14   FVC-Predicted Post % 119   Pre FEV1/FVC % % 76  Post FEV1/FCV % % 79   FEV1-Pre L 2.38   FEV1-Predicted Pre % 114   FEV1-Post L 2.49   DLCO uncorrected ml/min/mmHg 21.67   DLCO UNC% % 109   DLVA Predicted % 103   TLC L 6.05   TLC % Predicted % 123   RV % Predicted % 159     FeNO: None  Pathology: None  Echocardiogram: None  Heart Catheterization: None    Assessment & Plan:    Mild persistent asthmatic bronchitis with exacerbation  Seasonal allergies  Upper airway cough syndrome  BMI 32.0-32.9,adult  Discussion:  This is a 61 year old female BMI 32, allergic rhinitis mild intermittent asthma symptoms, history of COVID x2.  Prior RAST panel with mildly elevated IgE at 47, RAST panel negative.  History of community-acquired pneumonia.  Nodule that we initially seen her for has dissipated  Plan: Continue Symbicort Continue Singulair Continue Flonase Follow-up in clinic in 1 year or as needed.    Current Outpatient Medications:    ACETAMINOPHEN PO, Take 650 mg by mouth every 8 (eight) hours as needed for moderate pain., Disp: , Rfl:    albuterol (VENTOLIN HFA) 108 (90 Base) MCG/ACT inhaler, Inhale 2 puffs into the lungs every 6 (six) hours as needed for wheezing or shortness of breath., Disp: 18 g, Rfl: 11   AMBULATORY NON FORMULARY MEDICATION, 1 tablet 2 (two) times daily. Medication Name: Migrelief, Disp: , Rfl:    amitriptyline (ELAVIL) 50 MG tablet, Take 1 tablet (50 mg total) by mouth at bedtime., Disp: 90 tablet, Rfl: 3   budesonide-formoterol (SYMBICORT) 80-4.5 MCG/ACT inhaler, Inhale 2 puffs into the lungs 2 (two) times daily as needed (asthma)., Disp: 1 each, Rfl: 11    celecoxib (CELEBREX) 200 MG capsule, Take 200 mg by mouth 2 (two) times daily., Disp: , Rfl:    dexlansoprazole (DEXILANT) 60 MG capsule, Take 1 capsule (60 mg total) by mouth daily., Disp: 30 capsule, Rfl: 11   dicyclomine (BENTYL) 10 MG capsule, TAKE 1 TO 2 CAPSULES BY MOUTH EVERY 8 HOURS AS NEEDED, Disp: 540 capsule, Rfl: 0   fluticasone (FLONASE) 50 MCG/ACT nasal spray, Place 2 sprays into both nostrils daily., Disp: 16 g, Rfl: 11   Fremanezumab-vfrm (AJOVY) 225 MG/1.5ML SOAJ, Inject 225 mg into the skin every 30 (thirty) days., Disp: 4.5 mL, Rfl: 3   gabapentin (NEURONTIN) 300 MG capsule, Take 2 capsules (600 mg total) by mouth 3 (three) times daily., Disp: 540 capsule, Rfl: 3   gabapentin (NEURONTIN) 300 MG capsule, Take 3 capsules (900 mg total) by mouth 3 (three) times daily., Disp: 270 capsule, Rfl: 0   HYDROCHLOROTHIAZIDE PO, Take 1 tablet by mouth daily., Disp: , Rfl:    lisinopril (ZESTRIL) 10 MG tablet, Take 10 mg by mouth daily., Disp: , Rfl:    lubiprostone (AMITIZA) 24 MCG capsule, TAKE 1 CAPSULE (24 MCG TOTAL) BY MOUTH 2 (TWO) TIMES DAILY WITH A MEAL., Disp: 180 capsule, Rfl: 0   montelukast (SINGULAIR) 10 MG tablet, Take 1 tablet (10 mg total) by mouth at bedtime., Disp: 30 tablet, Rfl: 11   Spacer/Aero-Holding Chambers (AEROCHAMBER MV) inhaler, Use as instructed, Disp: 1 each, Rfl: 0   valACYclovir (VALTREX) 500 MG tablet, Take 500 mg by mouth 2 (two) times daily as needed (outbreaks). , Disp: , Rfl:    atorvastatin (LIPITOR) 20 MG tablet, Take 1 tablet (20 mg total) by mouth daily., Disp: 90 tablet, Rfl: 4   nitrofurantoin, macrocrystal-monohydrate, (MACROBID) 100 MG capsule, Take 1 capsule (  100 mg total) by mouth 2 (two) times daily. (Patient not taking: Reported on 06/02/2022), Disp: 10 capsule, Rfl: 0   phenazopyridine (PYRIDIUM) 200 MG tablet, Take 1 tablet (200 mg total) by mouth 3 (three) times daily. (Patient not taking: Reported on 06/02/2022), Disp: 6 tablet, Rfl: 0    valACYclovir (VALTREX) 1000 MG tablet, Take 1 tablet (1,000 mg total) by mouth 3 (three) times daily. (Patient not taking: Reported on 06/02/2022), Disp: 21 tablet, Rfl: 0    Garner Nash, DO [facility] 06/02/2022 12:04 PM

## 2022-06-06 NOTE — Progress Notes (Signed)
Chief Complaint:   OBESITY Cassidy Olsen is here to discuss Cassidy Olsen progress with Cassidy Olsen obesity treatment plan along with follow-up of Cassidy Olsen obesity related diagnoses. Cassidy Olsen is on the Category 2 Plan and states Cassidy Olsen is following Cassidy Olsen eating plan approximately 90% of the time. Cassidy Olsen states Cassidy Olsen is not currently exercising.  Today's visit was #: 2 Starting weight: 188 lbs Starting date: 04/26/2022 Today's weight: 178 lbs Today's date: 05/10/2022 Total lbs lost to date: 10 Total lbs lost since last in-office visit: 10  Interim History: Cassidy Olsen is here today for Cassidy Olsen first follow-up office visit since starting the program with Cassidy Olsen.  All blood work/ lab tests that were recently ordered by myself or an outside provider were reviewed with patient today per their request.   Extended time was spent counseling Cassidy Olsen on all new disease processes that were discovered or preexisting ones that are affected by BMI.  Cassidy Olsen understands that many of these abnormalities will need to monitored regularly along with the current treatment plan of prudent dietary changes, in which we are making each and every office visit, to improve these health parameters. Pt would like to journal Cassidy Olsen intake as well to help keep Cassidy Olsen on track. Cassidy Olsen snacks include popcorn, grapes, fruits, and [place] bars. Cassidy Olsen denies issues with plan. Pt lost 6 lbs in muscle mass and 3 lbs in fat mass. Pt was lost to follow up, as he initial OV was on 04/26/2022 as a new patient.   Subjective:   1. Prediabetes Worsening. Discussed labs with patient today. Pt's A1c is 6.3 (preciously 6.0) and fasting insulin 2.2. Cassidy Olsen denies hunger or cravings.  2. Essential hypertension Discussed labs with patient today. Pt is asymptomatic and without concerns. Cassidy Olsen denies concerns of headaches, visual changes, chest pain, or dizziness. Medication compliance good. Medication: Zestril  3. Other hyperlipidemia Discussed labs with patient today. Shatia is on meds per  cardiology/PCP. Triglycerides =195.  4. Mood disorder ([place])- with emotional eating Discussed labs with patient today. Pt with history of major depression but states anxiety has been the issue lately. Cassidy Olsen is anxious about Cassidy Olsen trigeminal neuralgia.  Assessment/Plan:   1. Prediabetes Labs worsening. Ivis will continue to work on weight loss, exercise, and decreasing simple carbohydrates to help decrease the risk of diabetes.  Handouts on prediabetes and insulin resistance given to pt after extensive discussion on how certain foods affect sugars and insulin. Continue prudent nutritional plan and weight loss. We will monitor levels and A1c.  2. Essential hypertension Adaleigh is working on healthy weight loss and exercise to improve blood pressure control. We will watch for signs of hypotension as Cassidy Olsen continues Cassidy Olsen lifestyle modifications.  Diastolic BP is above goal at 89. CMP within normal limits with creatinine 0.96. CBC within normal limits. Factors contributing to increased diastolic BP discussed with pt (i.e. high salt diet, obesity, lack of exercise, stress).  3. Other hyperlipidemia Elevated triglycerides. Pt went off fenofibrate due to side effects several months ago. Cassidy Olsen will call cardiology to discuss medication management and follow prudent nutritional plan. Decrease fatty carbs and repeat labs in 4-6 months. Counseling done. Cardiovascular risk and specific lipid/LDL goals reviewed.  We discussed several lifestyle modifications today and Dariya will continue to work on diet, exercise and weight loss efforts. Orders and follow up as documented in patient record.   Counseling Intensive lifestyle modifications are the first line treatment for this issue. Dietary changes: Increase soluble fiber. Decrease simple carbohydrates. Exercise changes: Moderate to vigorous-intensity aerobic  activity 150 minutes per week if tolerated. Lipid-lowering medications: see documented in medical  record.  4. Mood disorder ([place])- with emotional eating Pt met with Dr. Mallie Mussel yesterday and notes visit went well. Cassidy Olsen B12 is 407 and should improve with prudent nutritional plan. TSH is within normal limits.  Continue with Dr. Mallie Mussel for emotional eating coping strategies.  Mood stable now but if it worsens, I would recommend a separate counselor to help pt.  5. Obesity, current BMI 31.7 Dorisann is currently in the action stage of change. As such, Cassidy Olsen goal is to continue with weight loss efforts. Cassidy Olsen has agreed to change to keeping a food journal and adhering to recommended goals of 1300-1400 calories and 90+ grams protein.   Counseling done on findings of biometric/bioimpedance scale. Pt encouraged to measure vegetables and weigh lean proteins for next OV. Bring log to next OV. Pt educated on how to properly journal.  Exercise goals:  As is  Behavioral modification strategies: increasing lean protein intake, decreasing simple carbohydrates, increasing water intake, meal planning and cooking strategies, and keeping a strict food journal.  Cassidy Olsen has agreed to follow-up with our clinic in 2-3 weeks. Cassidy Olsen was informed of the importance of frequent follow-up visits to maximize Cassidy Olsen success with intensive lifestyle modifications for Cassidy Olsen multiple health conditions.   Objective:   Blood pressure 118/89, pulse (!) 115, temperature 98.4 F (36.9 C), height _0  (1.6 m), weight 178 lb (80.7 kg), SpO2 96 %. Body mass index is 31.53 kg/m.  General: Cooperative, alert, well developed, in no acute distress. HEENT: Conjunctivae and lids unremarkable. Cardiovascular: Regular rhythm.  Lungs: Normal work of breathing. Neurologic: No focal deficits.   Lab Results  Component Value Date   CREATININE 0.96 04/26/2022   BUN 18 04/26/2022   NA 139 04/26/2022   K 4.5 04/26/2022   CL 100 04/26/2022   CO2 21 04/26/2022   Lab Results  Component Value Date   ALT 17 04/26/2022   AST 20 04/26/2022    ALKPHOS 91 04/26/2022   BILITOT <0.2 04/26/2022   Lab Results  Component Value Date   HGBA1C 6.3 (H) 04/26/2022   HGBA1C 6.0 (H) 12/21/2020   Lab Results  Component Value Date   INSULIN 22.0 04/26/2022   Lab Results  Component Value Date   TSH 1.160 04/26/2022   Lab Results  Component Value Date   CHOL 180 04/26/2022   HDL 58 04/26/2022   LDLCALC 89 04/26/2022   TRIG 195 (H) 04/26/2022   CHOLHDL 3.1 04/26/2022   No results found for: "VD25OH" Lab Results  Component Value Date   WBC 6.0 04/26/2022   HGB 13.5 04/26/2022   HCT 40.4 04/26/2022   MCV 94 04/26/2022   PLT 306 04/26/2022    Attestation Statements:   Reviewed by clinician on day of visit: allergies, medications, problem list, medical history, surgical history, family history, social history, and previous encounter notes.  Time spent on visit including pre-visit chart review and post-visit care and charting was 65+ minutes.   I, Kathlene November, BS, CMA, am acting as transcriptionist for [employer], DO.  I have reviewed the above documentation for accuracy and completeness, and I agree with the above. Marjory Sneddon, D.O.  The [place] was signed into law in 2016 which includes the topic of electronic health records.  This provides immediate access to information in MyChart.  This includes consultation notes, operative notes, office notes, lab results and pathology reports.  If  you have any questions about what you read please let Cassidy Olsen know at your next visit so we can discuss your concerns and take corrective action if need be.  We are right here with you.

## 2022-06-07 ENCOUNTER — Telehealth (INDEPENDENT_AMBULATORY_CARE_PROVIDER_SITE_OTHER): Payer: Medicare Other | Admitting: Psychology

## 2022-06-07 ENCOUNTER — Ambulatory Visit (INDEPENDENT_AMBULATORY_CARE_PROVIDER_SITE_OTHER): Payer: Medicare Other | Admitting: Family Medicine

## 2022-06-10 DIAGNOSIS — N3000 Acute cystitis without hematuria: Secondary | ICD-10-CM | POA: Diagnosis not present

## 2022-06-10 DIAGNOSIS — N200 Calculus of kidney: Secondary | ICD-10-CM | POA: Diagnosis not present

## 2022-06-13 ENCOUNTER — Ambulatory Visit: Payer: Medicare Other | Attending: Physician Assistant | Admitting: Physician Assistant

## 2022-06-13 ENCOUNTER — Encounter: Payer: Self-pay | Admitting: Physician Assistant

## 2022-06-13 VITALS — BP 108/72 | HR 101 | Ht 63.0 in | Wt 185.0 lb

## 2022-06-13 DIAGNOSIS — I10 Essential (primary) hypertension: Secondary | ICD-10-CM | POA: Diagnosis not present

## 2022-06-13 DIAGNOSIS — R0600 Dyspnea, unspecified: Secondary | ICD-10-CM | POA: Insufficient documentation

## 2022-06-13 NOTE — Patient Instructions (Signed)
Medication Instructions:  No Changes *If you need a refill on your cardiac medications before your next appointment, please call your pharmacy*   Lab Work: No Labs If you have labs (blood work) drawn today and your tests are completely normal, you will receive your results only by: [place] (if you have MyChart) OR A paper copy in the mail If you have any lab test that is abnormal or we need to change your treatment, we will call you to review the results.   Testing/Procedures:[address], [address]. Your physician has requested that you have an echocardiogram. Echocardiography is a painless test that uses sound waves to create images of your heart. It provides your doctor with information about the size and shape of your heart and how well your heart's chambers and valves are working. This procedure takes approximately one hour. There are no restrictions for this procedure. Please do NOT wear cologne, perfume, aftershave, or lotions (deodorant is allowed). Please arrive 15 minutes prior to your appointment time.    Follow-Up: At [facility], you and your health needs are our priority.  As part of our continuing mission to provide you with exceptional heart care, we have created designated Provider Care Teams.  These Care Teams include your primary Cardiologist (physician) and Advanced Practice Providers (APPs -  Physician Assistants and Nurse Practitioners) who all work together to provide you with the care you need, when you need it.  We recommend signing up for the patient portal called "MyChart".  Sign up information is provided on this After Visit Summary.  MyChart is used to connect with patients for Virtual Visits (Telemedicine).  Patients are able to view lab/test results, encounter notes, upcoming appointments, etc.  Non-urgent messages can be sent to your provider as well.   To learn more about what you can do with MyChart, go to [email or web address].     Your next appointment:   6 month(s)  The format for your next appointment:   In Person  Provider:   Quay Burow, MD

## 2022-06-13 NOTE — Progress Notes (Signed)
Cardiology Office Note:    Date:  06/15/2022   ID:  Cassidy Olsen, DOB 26-Jan-1961, MRN [id number]  PCP:  Drue Flirt, MD   [facility] Cardiologist:  Quay Burow, MD     Referring MD: Drue Flirt, MD   Chief Complaint  Patient presents with   Follow-up   Chest Pain    History of Present Illness:    Cassidy Olsen is a 61 y.o. female with a hx of back pain, trigeminal neuralgia, GERD, IBS, hypertension, hyperlipidemia and prediabetes.  She has a father who has a history of CABG.  She herself has never had a heart attack or stroke.  Patient has dyspnea on exertion and has reactive airway disease treated by pulmonology service.  Patient was referred to cardiology service for evaluation of chest pain.  She was last seen by Dr. Gwenlyn Found in February 2023.  Her chest pain actually has not occurred for 6 to 8 months prior to she saw Dr. Gwenlyn Found.  Dr. Gwenlyn Found went ahead and ordered a coronary calcium score which came back showing her coronary calcium score was 0, she does have some aortic atherosclerosis.  There was no significant extracardiac issue on the coronary calcium CT.  Cholesterol lab work showed LDL of 133, total cholesterol 216, triglyceride at 101, HDL 65.  She is being followed by Dr. Valeta Harms for mild persistent asthma.  Patient presents today for follow-up.  She does feel like her breathing is getting worse.  This is likely related to underlying pulmonary issue.  I did recommend a echocardiogram to make sure her ejection fraction is normal.  Otherwise, she has not had any exertional type of chest pain.  She does not have any lower extremity edema, orthopnea or PND.  If echocardiogram is normal, I would not recommend any further work-up.  Note, her heart rate is historically fast, usually when she arrived in the office, her heart rate has been in the high 90s to low 100 range.  This is likely related to underlying breathing issue and her  deconditioning.  Past Medical History:  Diagnosis Date   ADHD    Arthritis 2010   Asthma    Back pain    Chronic headaches    Chronic pain    Constipation    Edema of both lower extremities    Family history of breast cancer    Family history of prostate cancer    Gallstones 2018   GERD (gastroesophageal reflux disease)    High cholesterol    History of kidney stones    Hx: UTI (urinary tract infection)    Hypertension    IBS (irritable bowel syndrome)    Interstitial cystitis 2012   Joint pain    Kidney problem    Migraine    Occipital neuralgia    Osteoarthritis    PNA (pneumonia) 2018   Pre-diabetes    Spinal stenosis    Trigeminal neuralgia    Trigeminal neuralgia     Past Surgical History:  Procedure Laterality Date   ABDOMINAL HYSTERECTOMY     ANKLE SURGERY Left 05/2009   APPENDECTOMY     BACK SURGERY     Benign Tumor Removal Right 01/26/2017   right upper arm by Dr. Pershing Proud at [facility]  2014   CHOLECYSTECTOMY N/A 12/24/2020   Procedure: LAPAROSCOPIC CHOLECYSTECTOMY;  Surgeon: Coralie Keens, MD;  Location: [place];  Service: General;  Laterality: N/A;   CYSTOSCOPY W/ URETERAL STENT PLACEMENT Left 07/25/2019   Procedure: CYSTOSCOPY WITH RETROGRADE PYELOGRAM/URETERAL STENT PLACEMENT;  Surgeon: Franchot Gallo, MD;  Location: [facility];  Service: Urology;  Laterality: Left;   CYSTOSCOPY W/ URETERAL STENT REMOVAL Left 08/15/2019   Procedure: CYSTOSCOPY WITH STENT REMOVAL;  Surgeon: Franchot Gallo, MD;  Location: [facility];  Service: Urology;  Laterality: Left;   CYSTOSCOPY WITH RETROGRADE PYELOGRAM, URETEROSCOPY AND STENT PLACEMENT Left 08/15/2019   Procedure: CYSTOSCOPY WITH RETROGRADE PYELOGRAM, URETEROSCOPY;  Surgeon: Franchot Gallo, MD;  Location: [facility];  Service: Urology;  Laterality: Left;  [address], URETEROSCOPY AND STENT PLACEMENT Right 09/12/2019    Procedure: CYSTOSCOPY WITH RIGHT RETROGRADE PYELOGRAM  URETEROSCOPY WITH HOLMIUM LASER STONE EXTRACTION AND STENT PLACEMENT;  Surgeon: Franchot Gallo, MD;  Location: [facility];  Service: Urology;  Laterality: Right;   DILATION AND CURETTAGE OF UTERUS     HOLMIUM LASER APPLICATION Left 67/07/4579   Procedure: HOLMIUM LASER APPLICATION;  Surgeon: Franchot Gallo, MD;  Location: [facility];  Service: Urology;  Laterality: Left;   JOINT REPLACEMENT     R knee   KIDNEY STONE SURGERY     neuro spine similator  01/2021   OVARIAN CYST REMOVAL  05/2010   right knee revision  2016   ROTATOR CUFF REPAIR Right 02/2019   SPINE SURGERY     TUBAL LIGATION      Current Medications: Current Meds  Medication Sig   ACETAMINOPHEN PO Take 650 mg by mouth every 8 (eight) hours as needed for moderate pain.   albuterol (VENTOLIN HFA) 108 (90 Base) MCG/ACT inhaler Inhale 2 puffs into the lungs every 6 (six) hours as needed for wheezing or shortness of breath.   albuterol (VENTOLIN HFA) 108 (90 Base) MCG/ACT inhaler Inhale 2 puffs into the lungs every 6 (six) hours as needed for wheezing or shortness of breath.   AMBULATORY NON FORMULARY MEDICATION 1 tablet 2 (two) times daily. Medication Name: Migrelief   amitriptyline (ELAVIL) 50 MG tablet Take 1 tablet (50 mg total) by mouth at bedtime.   budesonide-formoterol (SYMBICORT) 80-4.5 MCG/ACT inhaler Inhale 2 puffs into the lungs 2 (two) times daily as needed (asthma).   celecoxib (CELEBREX) 200 MG capsule Take 200 mg by mouth 2 (two) times daily.   dexlansoprazole (DEXILANT) 60 MG capsule Take 1 capsule (60 mg total) by mouth daily.   dicyclomine (BENTYL) 10 MG capsule TAKE 1 TO 2 CAPSULES BY MOUTH EVERY 8 HOURS AS NEEDED   fluticasone (FLONASE) 50 MCG/ACT nasal spray Place 2 sprays into both nostrils daily.   Fremanezumab-vfrm (AJOVY) 225 MG/1.5ML SOAJ Inject 225 mg into the skin every 30 (thirty) days.   gabapentin (NEURONTIN) 300 MG capsule Take 2 capsules  (600 mg total) by mouth 3 (three) times daily.   gabapentin (NEURONTIN) 300 MG capsule Take 3 capsules (900 mg total) by mouth 3 (three) times daily.   hydrochlorothiazide (HYDRODIURIL) 25 MG tablet Take 1 tablet by mouth daily.   lisinopril (ZESTRIL) 10 MG tablet Take 10 mg by mouth daily.   lubiprostone (AMITIZA) 24 MCG capsule TAKE 1 CAPSULE (24 MCG TOTAL) BY MOUTH 2 (TWO) TIMES DAILY WITH A MEAL.   montelukast (SINGULAIR) 10 MG tablet Take 1 tablet (10 mg total) by mouth at bedtime.   nitrofurantoin, macrocrystal-monohydrate, (MACROBID) 100 MG capsule Take 1 capsule (100 mg total) by mouth 2 (two) times daily.   phenazopyridine (PYRIDIUM) 200 MG tablet Take 1 tablet (200 mg total)  by mouth 3 (three) times daily.   Spacer/Aero-Holding Chambers (AEROCHAMBER MV) inhaler Use as instructed   valACYclovir (VALTREX) 1000 MG tablet Take 1 tablet (1,000 mg total) by mouth 3 (three) times daily.   valACYclovir (VALTREX) 500 MG tablet Take 500 mg by mouth 2 (two) times daily as needed (outbreaks).      Allergies:   Aspirin, Ketorolac tromethamine, Penicillins, Codeine, Ibuprofen, Latex, Omeprazole magnesium, Pantoprazole sodium, Penicillamine, Pregabalin, and Pyridium [phenazopyridine hcl]   Social History   Socioeconomic History   Marital status: Single    Spouse name: Not on file   Number of children: 1   Years of education: Not on file   Highest education level: Not on file  Occupational History   Occupation: retired   Occupation: Ship broker  Tobacco Use   Smoking status: Never   Smokeless tobacco: Never  Vaping Use   Vaping Use: Never used  Substance and Sexual Activity   Alcohol use: Yes    Comment: socially   Drug use: No   Sexual activity: Not on file  Other Topics Concern   Not on file  Social History Narrative   Not on file   Social Determinants of Health   Financial Resource Strain: Not on file  Food Insecurity: Not on file  Transportation Needs: Not on file  Physical  Activity: Not on file  Stress: Not on file  Social Connections: Not on file     Family History: The patient's family history includes Alzheimer's disease in her sister; Breast cancer in her paternal grandmother; Breast cancer (age of onset: 64) in her sister; Cancer in her paternal uncle; Depression in her mother; Diabetes in her paternal grandmother; Heart disease in her father; High blood pressure in her father and mother; Irritable bowel syndrome in her father and mother; Prostate cancer (age of onset: 70) in her father; Sudden death in her mother; Ulcers (age of onset: 59) in her mother.  ROS:   Please see the history of present illness.     All other systems reviewed and are negative.  EKGs/Labs/Other Studies Reviewed:    The following studies were reviewed today:  Cardiac calcium scoring 12/29/2021 FINDINGS: Non-cardiac: See separate report from [facility].   Ascending Aorta: Normal caliber. Aortic atherosclerosis.   Pericardium: Normal.   Coronary arteries: Normal origins.   Coronary Calcium Score:   Left main: 0   Left anterior descending artery: 0   Left circumflex artery: 0   Right coronary artery: 0   Total: 0   Percentile: 1st for age, sex, and race matched control.   IMPRESSION: 1. Coronary calcium score of 0. This was 1st percentile for age, gender, and race matched controls.   2. Aortic atherosclerosis.    EKG:  EKG is ordered today.  The ekg ordered today demonstrates normal sinus rhythm, no significant ST-T wave changes.  Recent Labs: 04/26/2022: ALT 17; BUN 18; Creatinine, Ser 0.96; Hemoglobin 13.5; Platelets 306; Potassium 4.5; Sodium 139; TSH 1.160  Recent Lipid Panel    Component Value Date/Time   CHOL 180 04/26/2022 1050   TRIG 195 (H) 04/26/2022 1050   HDL 58 04/26/2022 1050   CHOLHDL 3.1 04/26/2022 1050   LDLCALC 89 04/26/2022 1050     Risk Assessment/Calculations:           Physical Exam:    VS:  BP 108/72 (BP  Location: Left Arm, Patient Position: Sitting, Cuff Size: Normal)   Pulse (!) 101   Ht '5\' 3"'$  (1.6 m)  Wt 185 lb (83.9 kg)   BMI 32.77 kg/m        Wt Readings from Last 3 Encounters:  06/13/22 185 lb (83.9 kg)  06/02/22 184 lb 6.4 oz (83.6 kg)  05/24/22 183 lb (83 kg)     GEN:  Well nourished, well developed in no acute distress HEENT: Normal NECK: No JVD; No carotid bruits LYMPHATICS: No lymphadenopathy CARDIAC: RRR, no murmurs, rubs, gallops RESPIRATORY:  Clear to auscultation without rales, wheezing or rhonchi  ABDOMEN: Soft, non-tender, non-distended MUSCULOSKELETAL:  No edema; No deformity  SKIN: Warm and dry NEUROLOGIC:  Alert and oriented x 3 PSYCHIATRIC:  Normal affect   ASSESSMENT:    1. Dyspnea, unspecified type   2. Essential hypertension    PLAN:    In order of problems listed above:  Dyspnea on exertion: Patient had a coronary calcium score of 0 in May 2023.  Suspicion that her symptom is coming from the cardiac perspective is fairly low.  She is being followed by Dr. Valeta Harms of pulmonology service for mild persistent asthma.  Since the last visit, she continued to have worsening dyspnea, therefore I have recommended echocardiogram.  If echocardiogram is normal, I would not recommend any further work-up.  Hypertension: Blood pressure well controlled.           Medication Adjustments/Labs and Tests Ordered: Current medicines are reviewed at length with the patient today.  Concerns regarding medicines are outlined above.  Orders Placed This Encounter  Procedures   EKG 12-Lead   ECHOCARDIOGRAM COMPLETE   No orders of the defined types were placed in this encounter.   Patient Instructions  Medication Instructions:  No Changes *If you need a refill on your cardiac medications before your next appointment, please call your pharmacy*   Lab Work: No Labs If you have labs (blood work) drawn today and your tests are completely normal, you will receive  your results only by: [place] (if you have MyChart) OR A paper copy in the mail If you have any lab test that is abnormal or we need to change your treatment, we will call you to review the results.   Testing/Procedures:[address], [address]. Your physician has requested that you have an echocardiogram. Echocardiography is a painless test that uses sound waves to create images of your heart. It provides your doctor with information about the size and shape of your heart and how well your heart's chambers and valves are working. This procedure takes approximately one hour. There are no restrictions for this procedure. Please do NOT wear cologne, perfume, aftershave, or lotions (deodorant is allowed). Please arrive 15 minutes prior to your appointment time.    Follow-Up: At [facility], you and your health needs are our priority.  As part of our continuing mission to provide you with exceptional heart care, we have created designated Provider Care Teams.  These Care Teams include your primary Cardiologist (physician) and Advanced Practice Providers (APPs -  Physician Assistants and Nurse Practitioners) who all work together to provide you with the care you need, when you need it.  We recommend signing up for the patient portal called "MyChart".  Sign up information is provided on this After Visit Summary.  MyChart is used to connect with patients for Virtual Visits (Telemedicine).  Patients are able to view lab/test results, encounter notes, upcoming appointments, etc.  Non-urgent messages can be sent to your provider as well.   To learn more about what you can  do with MyChart, go to [email or web address].    Your next appointment:   6 month(s)  The format for your next appointment:   In Person  Provider:   Quay Burow, MD    Signed, Almyra Deforest, [place]  06/15/2022 9:41 PM    [place]

## 2022-06-14 ENCOUNTER — Telehealth (INDEPENDENT_AMBULATORY_CARE_PROVIDER_SITE_OTHER): Payer: Self-pay | Admitting: Psychology

## 2022-06-14 NOTE — Telephone Encounter (Signed)
  Office: [phone]  /  Fax: [phone]  Date of Call: June 14, 2022  Time of Call: 11:59am Provider: Glennie [place], PsyD  CONTENT:  This provider called Quentin Angst to check-in and schedule a follow-up appointment. A HIPAA compliant voicemail was left requesting a call back.   PLAN: This provider will wait for Jyasia to call back. No further follow-up planned by this provider.

## 2022-06-14 NOTE — Progress Notes (Signed)
Chief Complaint:   OBESITY Cassidy Olsen is here to discuss her progress with her obesity treatment plan along with follow-up of her obesity related diagnoses. [name] is on keeping a food journal and adhering to recommended goals of 1300-1400 calories and 90+ grams protein and states she is following her eating plan approximately 95% of the time. [name] states she is not currently exercising.  Today's visit was #: 3 Starting weight: 188 lbs Starting date: 04/26/2022 Today's weight: 183 lbs Today's date: 05/24/2022 Total lbs lost to date: 5 Total lbs lost since last in-office visit: +5  Interim History: [name] felt too full on plan. It was too much protein at dinner time. Snacks= gluten free pretzels, measuring goldfish. She bought amazing mayo, GHughes BBQ sauce. Pt is eating more shrimp and tuna and getting 80 oz water per day. She hit her protein goals daily, except 2 days and on average, calories were 1300-1400.  Subjective:   1. Other disorder of eating Pt is meeting with Dr. Mallie Mussel and it is going well. Pt is doing less emotional eating and she feels more in control with her food choices and emotional reaction to food.  Assessment/Plan:  No orders of the defined types were placed in this encounter.   There are no discontinued medications.   No orders of the defined types were placed in this encounter.    1. Other disorder of eating Behavior modification techniques were discussed today to help [name] deal with her emotional/non-hunger eating behaviors.  Orders and follow up as documented in patient record.  Symptoms under decent control currently. Continue with Dr. Mallie Mussel for emotional eating counseling.  2. Obesity, current BMI 32.4 [name] is currently in the action stage of change. As such, her goal is to continue with weight loss efforts. She has agreed to change to the Category 1 Plan with 100 calorie of 10:1 ratio and keeping a food journal and adhering to recommended  goals of 1100-1200 calories and 85+ grams protein.   Exercise goals:  As is  Behavioral modification strategies: keeping a strict food journal.  [name] has agreed to follow-up with our clinic in 2-3 weeks. She was informed of the importance of frequent follow-up visits to maximize her success with intensive lifestyle modifications for her multiple health conditions.   Objective:   Blood pressure 121/85, pulse (!) 111, temperature 98.9 F (37.2 C), weight 183 lb (83 kg), SpO2 98 %. Body mass index is 32.42 kg/m.  General: Cooperative, alert, well developed, in no acute distress. HEENT: Conjunctivae and lids unremarkable. Cardiovascular: Regular rhythm.  Lungs: Normal work of breathing. Neurologic: No focal deficits.   Lab Results  Component Value Date   CREATININE 0.96 04/26/2022   BUN 18 04/26/2022   NA 139 04/26/2022   K 4.5 04/26/2022   CL 100 04/26/2022   CO2 21 04/26/2022   Lab Results  Component Value Date   ALT 17 04/26/2022   AST 20 04/26/2022   ALKPHOS 91 04/26/2022   BILITOT <0.2 04/26/2022   Lab Results  Component Value Date   HGBA1C 6.3 (H) 04/26/2022   HGBA1C 6.0 (H) 12/21/2020   Lab Results  Component Value Date   INSULIN 22.0 04/26/2022   Lab Results  Component Value Date   TSH 1.160 04/26/2022   Lab Results  Component Value Date   CHOL 180 04/26/2022   HDL 58 04/26/2022   LDLCALC 89 04/26/2022   TRIG 195 (H) 04/26/2022   CHOLHDL 3.1 04/26/2022   No  results found for: "VD25OH" Lab Results  Component Value Date   WBC 6.0 04/26/2022   HGB 13.5 04/26/2022   HCT 40.4 04/26/2022   MCV 94 04/26/2022   PLT 306 04/26/2022   Attestation Statements:   Reviewed by clinician on day of visit: allergies, medications, problem list, medical history, surgical history, family history, social history, and previous encounter notes.  Time spent on visit including pre-visit chart review and post-visit care and charting was 33 minutes.   I, Kathlene November, BS, CMA, am acting as transcriptionist for [employer], DO.   I have reviewed the above documentation for accuracy and completeness, and I agree with the above. Marjory Sneddon, D.O.  The [place] was signed into law in 2016 which includes the topic of electronic health records.  This provides immediate access to information in MyChart.  This includes consultation notes, operative notes, office notes, lab results and pathology reports.  If you have any questions about what you read please let us know at your next visit so we can discuss your concerns and take corrective action if need be.  We are right here with you.

## 2022-06-16 ENCOUNTER — Other Ambulatory Visit: Payer: Self-pay | Admitting: Gastroenterology

## 2022-06-17 DIAGNOSIS — Z23 Encounter for immunization: Secondary | ICD-10-CM | POA: Diagnosis not present

## 2022-06-21 DIAGNOSIS — M1990 Unspecified osteoarthritis, unspecified site: Secondary | ICD-10-CM | POA: Diagnosis not present

## 2022-06-21 DIAGNOSIS — Z91040 Latex allergy status: Secondary | ICD-10-CM | POA: Diagnosis not present

## 2022-06-21 DIAGNOSIS — Z885 Allergy status to narcotic agent status: Secondary | ICD-10-CM | POA: Diagnosis not present

## 2022-06-21 DIAGNOSIS — Z888 Allergy status to other drugs, medicaments and biological substances status: Secondary | ICD-10-CM | POA: Diagnosis not present

## 2022-06-21 DIAGNOSIS — G501 Atypical facial pain: Secondary | ICD-10-CM | POA: Diagnosis not present

## 2022-06-21 DIAGNOSIS — J45909 Unspecified asthma, uncomplicated: Secondary | ICD-10-CM | POA: Diagnosis not present

## 2022-06-21 DIAGNOSIS — Z880 Allergy status to penicillin: Secondary | ICD-10-CM | POA: Diagnosis not present

## 2022-06-21 DIAGNOSIS — G500 Trigeminal neuralgia: Secondary | ICD-10-CM | POA: Diagnosis not present

## 2022-06-21 DIAGNOSIS — E785 Hyperlipidemia, unspecified: Secondary | ICD-10-CM | POA: Diagnosis not present

## 2022-06-21 DIAGNOSIS — K219 Gastro-esophageal reflux disease without esophagitis: Secondary | ICD-10-CM | POA: Diagnosis not present

## 2022-06-21 DIAGNOSIS — Z79899 Other long term (current) drug therapy: Secondary | ICD-10-CM | POA: Diagnosis not present

## 2022-06-23 ENCOUNTER — Encounter (INDEPENDENT_AMBULATORY_CARE_PROVIDER_SITE_OTHER): Payer: Self-pay | Admitting: Family Medicine

## 2022-06-23 ENCOUNTER — Ambulatory Visit (INDEPENDENT_AMBULATORY_CARE_PROVIDER_SITE_OTHER): Payer: Medicare Other | Admitting: Family Medicine

## 2022-06-23 VITALS — BP 132/88 | HR 87 | Temp 98.0°F | Ht 63.0 in | Wt 179.8 lb

## 2022-06-23 DIAGNOSIS — R7303 Prediabetes: Secondary | ICD-10-CM

## 2022-06-23 DIAGNOSIS — E669 Obesity, unspecified: Secondary | ICD-10-CM

## 2022-06-23 DIAGNOSIS — Z6831 Body mass index (BMI) 31.0-31.9, adult: Secondary | ICD-10-CM

## 2022-06-23 DIAGNOSIS — B370 Candidal stomatitis: Secondary | ICD-10-CM | POA: Diagnosis not present

## 2022-06-23 DIAGNOSIS — J029 Acute pharyngitis, unspecified: Secondary | ICD-10-CM | POA: Diagnosis not present

## 2022-07-06 ENCOUNTER — Other Ambulatory Visit (HOSPITAL_COMMUNITY): Payer: Self-pay

## 2022-07-06 ENCOUNTER — Telehealth: Payer: Self-pay | Admitting: *Deleted

## 2022-07-06 DIAGNOSIS — G43709 Chronic migraine without aura, not intractable, without status migrainosus: Secondary | ICD-10-CM

## 2022-07-06 MED ORDER — ONABOTULINUMTOXINA 200 UNITS IJ SOLR
INTRAMUSCULAR | 1 refills | Status: DC
  Filled 2022-07-06: qty 1, 90d supply, fill #0
  Filled 2022-09-28: qty 1, 90d supply, fill #1

## 2022-07-06 NOTE — Telephone Encounter (Signed)
Patient Advocate Encounter  Prior Authorization for Botox 200UNIT solution has been approved.    PA# [id number] Effective dates: 07/06/2022 through 07/06/2023  Can be filled at [place], [place] Patient Cassidy Olsen Patient Advocate Team Direct Number: [phone]  Fax: [phone]

## 2022-07-06 NOTE — Telephone Encounter (Signed)
Patient Advocate Encounter   Received notification that prior authorization for Botox 200UNIT solution is required.   PA submitted on 07/06/2022 Key [id number] Status is pending       Lyndel Safe, [place] Patient Advocate Specialist [place] Patient Advocate Team Direct Number: [phone]  Fax: [phone]

## 2022-07-06 NOTE — Progress Notes (Signed)
Chief Complaint:   OBESITY Cassidy Olsen is here to discuss her progress with her obesity treatment plan along with follow-up of her obesity related diagnoses. Cassidy Olsen is on Category 1 Plan with 100 calorie of 10:1 ratio and keeping a food journal and adhering to recommended goals of 1100-1200 calories and 85+ grams protein.   and states she is following her eating plan approximately 95% of the time. Cassidy Olsen states she is walking 4000-6000 steps 4 times per week.  Today's visit was #: 4 Starting weight: 188 lbs Starting date: 04/26/2022 Today's weight: 179 lbs Today's date: 06/23/2022 Total lbs lost to date: 9 lbs Total lbs lost since last in-office visit: 4 lbs  Interim History: She is journaling and hitting her goals 90% of the time or more.  She is doing well.  She lost 2 lbs of fat mass and 1 lb in muscle mass.  Going away for one week for the holiday.   Subjective:   1. Prediabetes A1c 6.3 on 04/26/2022.  Has decreased cravings and has been eating on plan more.    Assessment/Plan:  No orders of the defined types were placed in this encounter.   There are no discontinued medications.   No orders of the defined types were placed in this encounter.    1. Prediabetes Recheck A1c and fasting insulin at the end of December.  Continue prudent nutritional plan and weight loss and increase exercise.   2. Obesity, current BMI 31.8 Patients goal is to walk 30 minutes 5 days per week.   Cassidy Olsen is currently in the action stage of change. As such, her goal is to continue with weight loss efforts. She has agreed to the Category 1 Plan and keeping a food journal and adhering to recommended goals of 1100-1200 calories and 90+ protein daily.   Exercise goals: Patients goal is to walk 30 minutes 5 days per week.   Behavioral modification strategies: holiday eating strategies  and avoiding temptations.  Cassidy Olsen has agreed to follow-up with our clinic in 4-5 weeks. She was informed of the  importance of frequent follow-up visits to maximize her success with intensive lifestyle modifications for her multiple health conditions.   Objective:   Blood pressure 132/88, pulse 87, temperature 98 F (36.7 C), height '5\' 3"'$  (1.6 m), weight 179 lb 12.8 oz (81.6 kg), SpO2 99 %. Body mass index is 31.85 kg/m.  General: Cooperative, alert, well developed, in no acute distress. HEENT: Conjunctivae and lids unremarkable. Cardiovascular: Regular rhythm.  Lungs: Normal work of breathing. Neurologic: No focal deficits.   Lab Results  Component Value Date   CREATININE 0.96 04/26/2022   BUN 18 04/26/2022   NA 139 04/26/2022   K 4.5 04/26/2022   CL 100 04/26/2022   CO2 21 04/26/2022   Lab Results  Component Value Date   ALT 17 04/26/2022   AST 20 04/26/2022   ALKPHOS 91 04/26/2022   BILITOT <0.2 04/26/2022   Lab Results  Component Value Date   HGBA1C 6.3 (H) 04/26/2022   HGBA1C 6.0 (H) 12/21/2020   Lab Results  Component Value Date   INSULIN 22.0 04/26/2022   Lab Results  Component Value Date   TSH 1.160 04/26/2022   Lab Results  Component Value Date   CHOL 180 04/26/2022   HDL 58 04/26/2022   LDLCALC 89 04/26/2022   TRIG 195 (H) 04/26/2022   CHOLHDL 3.1 04/26/2022   No results found for: "VD25OH" Lab Results  Component Value Date  WBC 6.0 04/26/2022   HGB 13.5 04/26/2022   HCT 40.4 04/26/2022   MCV 94 04/26/2022   PLT 306 04/26/2022   No results found for: "IRON", "TIBC", "FERRITIN"  Attestation Statements:   Reviewed by clinician on day of visit: allergies, medications, problem list, medical history, surgical history, family history, social history, and previous encounter notes.  Time spent on visit including pre-visit chart review and post-visit care and charting was 30 minutes.   I, Davy Pique, RMA, am acting as Location manager for [employer], DO.   I have reviewed the above documentation for accuracy and completeness, and I agree with the  above. Marjory Sneddon, D.O.  The [place] was signed into law in 2016 which includes the topic of electronic health records.  This provides immediate access to information in MyChart.  This includes consultation notes, operative notes, office notes, lab results and pathology reports.  If you have any questions about what you read please let us know at your next visit so we can discuss your concerns and take corrective action if need be.  We are right here with you.

## 2022-07-06 NOTE — Addendum Note (Signed)
Addended by: Wyvonnia Lora on: 07/06/2022 03:11 PM   Modules accepted: Orders

## 2022-07-06 NOTE — Telephone Encounter (Signed)
Error
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
## 2022-07-06 NOTE — Telephone Encounter (Signed)
Needs Botox re-auth. Has appt tomorrow. Marked buy/bill but wanted to make sure since no approval dates on file.   Chronic Migraine CPT 64615    Botox J0585 Units:200   G43.709 Chronic migraine without aura without status migrainosus, not intractable

## 2022-07-06 NOTE — Telephone Encounter (Signed)
E-scribed botox rx to [facility]. Marked urgent.

## 2022-07-06 NOTE — Telephone Encounter (Addendum)
Spoke to patient due to specialty pharmacy botox may not be delivered until later this week Pt was upset that we had to reschedule botox appointment  Informed Megan NP of delivery delay and how  can we get the patient  to keep her appointment on 07/07/2022 . Per Jinny Blossom NP since  patient has a co pay of $60 if she pays co pay through [facility]  today we can use botox  office floor stock . Relayed message to Patient was not receiving information well Pt states that I was giving her wrong information and didn't know what I was talking about. Megan NP spoke to patient explained what was going on with her Botox . Megan,NP gave patient Pharmacy # to call to get the information she needed for Botox . Spoke to [facility] patient did call and pay $60 co pay and waiting for payment to process . Will call Pharmacy back tomorrow  in am to make sure payment was processed . Pharmacy states botox TBD 07/07/2022 not sure of time. Thanked Pharmacy for speaking to patient

## 2022-07-07 ENCOUNTER — Ambulatory Visit (INDEPENDENT_AMBULATORY_CARE_PROVIDER_SITE_OTHER): Payer: Medicare Other | Admitting: Physician Assistant

## 2022-07-07 ENCOUNTER — Ambulatory Visit (INDEPENDENT_AMBULATORY_CARE_PROVIDER_SITE_OTHER): Payer: Medicare Other | Admitting: Family Medicine

## 2022-07-07 ENCOUNTER — Other Ambulatory Visit (HOSPITAL_COMMUNITY): Payer: Self-pay

## 2022-07-07 VITALS — BP 145/101 | HR 101 | Ht 63.0 in | Wt 184.4 lb

## 2022-07-07 DIAGNOSIS — G43709 Chronic migraine without aura, not intractable, without status migrainosus: Secondary | ICD-10-CM

## 2022-07-07 MED ORDER — ONABOTULINUMTOXINA 200 UNITS IJ SOLR
155.0000 [IU] | Freq: Once | INTRAMUSCULAR | Status: AC
  Administered 2022-07-07: 155 [IU] via INTRAMUSCULAR

## 2022-07-08 ENCOUNTER — Other Ambulatory Visit (HOSPITAL_COMMUNITY): Payer: Self-pay

## 2022-07-11 ENCOUNTER — Ambulatory Visit (HOSPITAL_COMMUNITY): Payer: Medicare Other | Attending: Physician Assistant

## 2022-07-11 ENCOUNTER — Ambulatory Visit
Admission: RE | Admit: 2022-07-11 | Discharge: 2022-07-11 | Disposition: A | Discharge status: Home / Self Care | Payer: Medicare Other | Source: Ambulatory Visit | Attending: Obstetrics and Gynecology | Admitting: Obstetrics and Gynecology

## 2022-07-11 DIAGNOSIS — Z1231 Encounter for screening mammogram for malignant neoplasm of breast: Secondary | ICD-10-CM

## 2022-07-11 DIAGNOSIS — R0600 Dyspnea, unspecified: Secondary | ICD-10-CM | POA: Diagnosis not present

## 2022-07-11 LAB — ECHOCARDIOGRAM COMPLETE
Area-P 1/2: 14.87 cm2
S' Lateral: 1.8 cm

## 2022-07-14 ENCOUNTER — Ambulatory Visit (INDEPENDENT_AMBULATORY_CARE_PROVIDER_SITE_OTHER): Payer: Medicare Other | Admitting: Family Medicine

## 2022-07-20 DIAGNOSIS — M25511 Pain in right shoulder: Secondary | ICD-10-CM | POA: Diagnosis not present

## 2022-07-26 ENCOUNTER — Encounter (INDEPENDENT_AMBULATORY_CARE_PROVIDER_SITE_OTHER): Payer: Self-pay | Admitting: Family Medicine

## 2022-07-26 ENCOUNTER — Ambulatory Visit (INDEPENDENT_AMBULATORY_CARE_PROVIDER_SITE_OTHER): Payer: Medicare Other | Admitting: Family Medicine

## 2022-07-26 VITALS — BP 136/96 | HR 72 | Temp 98.6°F | Ht 63.0 in | Wt 179.0 lb

## 2022-07-26 DIAGNOSIS — Z6831 Body mass index (BMI) 31.0-31.9, adult: Secondary | ICD-10-CM | POA: Diagnosis not present

## 2022-07-26 DIAGNOSIS — I10 Essential (primary) hypertension: Secondary | ICD-10-CM | POA: Diagnosis not present

## 2022-07-26 DIAGNOSIS — E669 Obesity, unspecified: Secondary | ICD-10-CM | POA: Diagnosis not present

## 2022-07-27 ENCOUNTER — Emergency Department (HOSPITAL_COMMUNITY): Payer: Medicare Other

## 2022-07-27 ENCOUNTER — Emergency Department (HOSPITAL_COMMUNITY)
Admission: EM | Admit: 2022-07-27 | Discharge: 2022-07-27 | Disposition: A | Discharge status: Home / Self Care | Payer: Medicare Other | Attending: Emergency Medicine | Admitting: Emergency Medicine

## 2022-07-27 DIAGNOSIS — Z79899 Other long term (current) drug therapy: Secondary | ICD-10-CM | POA: Diagnosis not present

## 2022-07-27 DIAGNOSIS — R55 Syncope and collapse: Secondary | ICD-10-CM | POA: Insufficient documentation

## 2022-07-27 DIAGNOSIS — Z91040 Latex allergy status: Secondary | ICD-10-CM | POA: Insufficient documentation

## 2022-07-27 DIAGNOSIS — G4489 Other headache syndrome: Secondary | ICD-10-CM | POA: Diagnosis not present

## 2022-07-27 DIAGNOSIS — I10 Essential (primary) hypertension: Secondary | ICD-10-CM | POA: Insufficient documentation

## 2022-07-27 DIAGNOSIS — J45909 Unspecified asthma, uncomplicated: Secondary | ICD-10-CM | POA: Diagnosis not present

## 2022-07-27 DIAGNOSIS — I959 Hypotension, unspecified: Secondary | ICD-10-CM | POA: Diagnosis not present

## 2022-07-27 DIAGNOSIS — Z1152 Encounter for screening for COVID-19: Secondary | ICD-10-CM | POA: Insufficient documentation

## 2022-07-27 LAB — CBC WITH DIFFERENTIAL/PLATELET
Abs Immature Granulocytes: 0.07 10*3/uL (ref 0.00–0.07)
Basophils Absolute: 0.1 10*3/uL (ref 0.0–0.1)
Basophils Relative: 1 %
Eosinophils Absolute: 0.2 10*3/uL (ref 0.0–0.5)
Eosinophils Relative: 2 %
HCT: 41.7 % (ref 36.0–46.0)
Hemoglobin: 14 g/dL (ref 12.0–15.0)
Immature Granulocytes: 1 %
Lymphocytes Relative: 39 %
Lymphs Abs: 4.9 10*3/uL — ABNORMAL HIGH (ref 0.7–4.0)
MCH: 31.9 pg (ref 26.0–34.0)
MCHC: 33.6 g/dL (ref 30.0–36.0)
MCV: 95 fL (ref 80.0–100.0)
Monocytes Absolute: 0.8 10*3/uL (ref 0.1–1.0)
Monocytes Relative: 6 %
Neutro Abs: 6.4 10*3/uL (ref 1.7–7.7)
Neutrophils Relative %: 51 %
Platelets: 368 10*3/uL (ref 150–400)
RBC: 4.39 MIL/uL (ref 3.87–5.11)
RDW: 13.5 % (ref 11.5–15.5)
WBC: 12.5 10*3/uL — ABNORMAL HIGH (ref 4.0–10.5)
nRBC: 0 % (ref 0.0–0.2)

## 2022-07-27 LAB — COMPREHENSIVE METABOLIC PANEL
ALT: 28 U/L (ref 0–44)
AST: 21 U/L (ref 15–41)
Albumin: 4.1 g/dL (ref 3.5–5.0)
Alkaline Phosphatase: 61 U/L (ref 38–126)
Anion gap: 10 (ref 5–15)
BUN: 30 mg/dL — ABNORMAL HIGH (ref 6–20)
CO2: 24 mmol/L (ref 22–32)
Calcium: 9.3 mg/dL (ref 8.9–10.3)
Chloride: 103 mmol/L (ref 98–111)
Creatinine, Ser: 1.17 mg/dL — ABNORMAL HIGH (ref 0.44–1.00)
GFR, Estimated: 53 mL/min — ABNORMAL LOW (ref >60)
Glucose, Bld: 107 mg/dL — ABNORMAL HIGH (ref 70–99)
Potassium: 3.5 mmol/L (ref 3.5–5.1)
Sodium: 137 mmol/L (ref 135–145)
Total Bilirubin: 0.8 mg/dL (ref 0.3–1.2)
Total Protein: 7.4 g/dL (ref 6.5–8.1)

## 2022-07-27 LAB — RESP PANEL BY RT-PCR (RSV, FLU A&B, COVID)  RVPGX2
Influenza A by PCR: NEGATIVE
Influenza B by PCR: NEGATIVE
Resp Syncytial Virus by PCR: NEGATIVE
SARS Coronavirus 2 by RT PCR: NEGATIVE

## 2022-07-27 LAB — TSH: TSH: 4.022 u[IU]/mL (ref 0.350–4.500)

## 2022-07-27 LAB — RAPID URINE DRUG SCREEN, HOSP PERFORMED
Amphetamines: NOT DETECTED
Barbiturates: NOT DETECTED
Benzodiazepines: NOT DETECTED
Cocaine: NOT DETECTED
Opiates: NOT DETECTED
Tetrahydrocannabinol: NOT DETECTED

## 2022-07-27 LAB — TROPONIN I (HIGH SENSITIVITY)
Troponin I (High Sensitivity): <2 ng/L (ref <18)
Troponin I (High Sensitivity): <2 ng/L (ref <18)

## 2022-07-27 LAB — T4, FREE: Free T4: 1.11 ng/dL (ref 0.61–1.12)

## 2022-07-27 MED ORDER — PROCHLORPERAZINE EDISYLATE 10 MG/2ML IJ SOLN
10.0000 mg | Freq: Once | INTRAMUSCULAR | Status: AC
  Administered 2022-07-27: 10 mg via INTRAVENOUS
  Filled 2022-07-27: qty 2

## 2022-07-27 MED ORDER — IOHEXOL 350 MG/ML SOLN
100.0000 mL | Freq: Once | INTRAVENOUS | Status: AC | PRN
  Administered 2022-07-27: 100 mL via INTRAVENOUS

## 2022-07-27 MED ORDER — SODIUM CHLORIDE 0.9 % IV SOLN
25.0000 mg | INTRAVENOUS | Status: DC | PRN
  Administered 2022-07-27: 25 mg via INTRAVENOUS
  Filled 2022-07-27: qty 25
  Filled 2022-07-27: qty 0.5

## 2022-07-27 MED ORDER — LACTATED RINGERS IV BOLUS
1000.0000 mL | Freq: Once | INTRAVENOUS | Status: AC
  Administered 2022-07-27: 1000 mL via INTRAVENOUS

## 2022-07-27 NOTE — ED Triage Notes (Signed)
Ems brings pt in from the hair salon. Bystanders state pt had a syncopal episode followed by tremors. Pts only complaint is a headache now. Initial BP by ems was 88/40

## 2022-07-27 NOTE — ED Provider Notes (Signed)
[facility] DEPT Provider Note   CSN: [id number] Arrival date & time: 07/27/22  1352     History  Chief Complaint  Patient presents with   Loss of Consciousness    Cassidy Olsen is a 61 y.o. female.  HPI     61 year old female with a history of back pain, trigeminal neuralgia, GERD, IBS, hypertension, hyperlipidemia, prediabetes   She had a an evaluation with cardiology November 6 for a follow-up regarding chest pain.  She was seen by Dr. Alvester Chou in February 2023 and had a coronary calcium score of 0  She is followed by Dr. Valeta Harms of pulmonary for mild persistent asthma   Had a banana Then felt self slipping away, felt nauseas, lightheaded, whole body sinking into chair Was there since 1040 fine this AM, fine there Family who was doing hair noted she seemed to lose consciousness, she sternal rubbed her without response then called 911, after a few minutes began to have some shaking, unclear if it was seizure-like, turning head towards right, right side shaking more than left, mouth opening and closing in slow sort of way, head turning and shaking towards the right. No loss of control of bowel or bladder, no tongue biting Estimates 7 min  Yesterday 135/90, takes bp medication, took it this morning No recent change in medications  Now having headache, did not have it before Heart felt like it was racing, no chest pain, did have racing/heart palpitations No shortness of breath First noticed headache after EMS arrived. Has hx of trigeminal neuralgia Feels more like tension headache, it is worse on right side like trigeminal neuralgia but it is the whole head, hx of migraine, usually will feel headache whole head but this headache is middle to right.. nephew with aneurysm hx.  Gabapentin, dexilant, amitza, 1300 tylenol, celebrex , fish oil  Over last month off and on diarrhea, loose stool, 3-4 times a day, just water, happened Saturday or Sunday .  no black or bloody stools today.  Took slim tea last night, this AM ahd soft large BM then watery bowel movement today.  Had english muffin and coffee, water, and banana today   No fever, cough, urinary symptoms,      Past Medical History:  Diagnosis Date   ADHD    Arthritis 2010   Asthma    Back pain    Chronic headaches    Chronic pain    Constipation    Edema of both lower extremities    Family history of breast cancer    Family history of prostate cancer    Gallstones 2018   GERD (gastroesophageal reflux disease)    High cholesterol    History of kidney stones    Hx: UTI (urinary tract infection)    Hypertension    IBS (irritable bowel syndrome)    Interstitial cystitis 2012   Joint pain    Kidney problem    Migraine    Occipital neuralgia    Osteoarthritis    PNA (pneumonia) 2018   Pre-diabetes    Spinal stenosis    Trigeminal neuralgia    Trigeminal neuralgia       Home Medications Prior to Admission medications   Medication Sig Start Date End Date Taking? Authorizing Provider  ACETAMINOPHEN PO Take 650 mg by mouth every 8 (eight) hours as needed for moderate pain.    [provider]  albuterol (VENTOLIN HFA) 108 (90 Base) MCG/ACT inhaler Inhale 2 puffs into the lungs every  6 (six) hours as needed for wheezing or shortness of breath. 06/08/20   Icard, Octavio Graves, DO  albuterol (VENTOLIN HFA) 108 (90 Base) MCG/ACT inhaler Inhale 2 puffs into the lungs every 6 (six) hours as needed for wheezing or shortness of breath. 06/02/22   Icard, Octavio Graves, DO  AMBULATORY NON FORMULARY MEDICATION 1 tablet 2 (two) times daily. Medication Name: [place]    [provider]  amitriptyline (ELAVIL) 50 MG tablet Take 1 tablet (50 mg total) by mouth at bedtime. 02/23/22   Lomax, Amy, NP  atorvastatin (LIPITOR) 20 MG tablet Take 1 tablet (20 mg total) by mouth daily. 10/08/21 01/06/22  Lorretta Harp, MD  botulinum toxin Type A (BOTOX) 200 units injection INJECT 155  UNITS INTRAMUSCULARLY EVERY 12 WEEKS (GIVEN AT MD  OFFICE, DISCARD UNUSED) 07/06/22   Lomax, Amy, NP  budesonide-formoterol (SYMBICORT) 80-4.5 MCG/ACT inhaler Inhale 2 puffs into the lungs 2 (two) times daily as needed (asthma). 09/15/21   Garner Nash, DO  celecoxib (CELEBREX) 200 MG capsule Take 200 mg by mouth 2 (two) times daily.    [provider]  dexlansoprazole (DEXILANT) 60 MG capsule TAKE 1 CAPSULE BY MOUTH EVERY DAY 06/17/22   Nandigam, Venia Minks, MD  dicyclomine (BENTYL) 10 MG capsule TAKE 1 TO 2 CAPSULES BY MOUTH EVERY 8 HOURS AS NEEDED 05/30/22   Nandigam, Venia Minks, MD  fluticasone (FLONASE) 50 MCG/ACT nasal spray Place 2 sprays into both nostrils daily. 06/08/20   Icard, Octavio Graves, DO  Fremanezumab-vfrm (AJOVY) 225 MG/1.5ML SOAJ Inject 225 mg into the skin every 30 (thirty) days. 05/03/22   Lomax, Amy, NP  gabapentin (NEURONTIN) 300 MG capsule Take 2 capsules (600 mg total) by mouth 3 (three) times daily. 05/03/22   Lomax, Amy, NP  gabapentin (NEURONTIN) 300 MG capsule Take 3 capsules (900 mg total) by mouth 3 (three) times daily. 05/24/22   Lomax, Amy, NP  lisinopril (ZESTRIL) 10 MG tablet Take 10 mg by mouth daily.    [provider]  lubiprostone (AMITIZA) 24 MCG capsule TAKE 1 CAPSULE (24 MCG TOTAL) BY MOUTH 2 (TWO) TIMES DAILY WITH A MEAL. 05/23/22   Mauri Pole, MD  montelukast (SINGULAIR) 10 MG tablet Take 1 tablet (10 mg total) by mouth at bedtime. 09/15/21   Garner Nash, DO  Spacer/Aero-Holding Chambers (AEROCHAMBER MV) inhaler Use as instructed 04/18/18   Icard, Octavio Graves, DO  valACYclovir (VALTREX) 1000 MG tablet Take 1 tablet (1,000 mg total) by mouth 3 (three) times daily. 03/10/20   Maximiano Coss, NP  valACYclovir (VALTREX) 500 MG tablet Take 500 mg by mouth 2 (two) times daily as needed (outbreaks).     [provider]      Allergies    Aspirin, Ketorolac tromethamine, Penicillins, Codeine, Ibuprofen, Latex, Omeprazole magnesium,  Pantoprazole sodium, Penicillamine, Pregabalin, and Pyridium [phenazopyridine hcl]    Review of Systems   Review of Systems  Physical Exam Updated Vital Signs BP (!) 119/92   Pulse 93   Temp 98 F (36.7 C) (Oral)   Resp 18   SpO2 98%  Physical Exam Vitals and nursing note reviewed.  Constitutional:      General: She is not in acute distress.    Appearance: Normal appearance. She is well-developed. She is not ill-appearing or diaphoretic.  HENT:     Head: Normocephalic and atraumatic.  Eyes:     General: No visual field deficit.    Extraocular Movements: Extraocular movements intact.  Conjunctiva/sclera: Conjunctivae normal.     Pupils: Pupils are equal, round, and reactive to light.  Cardiovascular:     Rate and Rhythm: Normal rate and regular rhythm.     Pulses: Normal pulses.     Heart sounds: Normal heart sounds. No murmur heard.    No friction rub. No gallop.  Pulmonary:     Effort: Pulmonary effort is normal. No respiratory distress.     Breath sounds: Normal breath sounds. No wheezing or rales.  Abdominal:     General: There is no distension.     Palpations: Abdomen is soft.     Tenderness: There is no abdominal tenderness. There is no guarding.  Musculoskeletal:        General: No swelling or tenderness.     Cervical back: Normal range of motion.  Skin:    General: Skin is warm and dry.     Findings: No erythema or rash.  Neurological:     General: No focal deficit present.     Mental Status: She is alert and oriented to person, place, and time.     GCS: GCS eye subscore is 4. GCS verbal subscore is 5. GCS motor subscore is 6.     Cranial Nerves: No cranial nerve deficit, dysarthria or facial asymmetry.     Sensory: No sensory deficit.     Motor: No weakness or tremor.     Coordination: Coordination normal. Finger-Nose-Finger Test normal.     Gait: Gait normal.     ED Results / Procedures / Treatments   Labs (all labs ordered are listed, but only  abnormal results are displayed) Labs Reviewed  CBC WITH DIFFERENTIAL/PLATELET - Abnormal; Notable for the following components:      Result Value   WBC 12.5 (*)    Lymphs Abs 4.9 (*)    All other components within normal limits  COMPREHENSIVE METABOLIC PANEL - Abnormal; Notable for the following components:   Glucose, Bld 107 (*)    BUN 30 (*)    Creatinine, Ser 1.17 (*)    GFR, Estimated 53 (*)    All other components within normal limits  RESP PANEL BY RT-PCR (RSV, FLU A&B, COVID)  RVPGX2  TSH  T4, FREE  RAPID URINE DRUG SCREEN, HOSP PERFORMED  TROPONIN I (HIGH SENSITIVITY)  TROPONIN I (HIGH SENSITIVITY)    EKG EKG Interpretation  Date/Time:  Wednesday July 27 2022 14:06:12 EST Ventricular Rate:  80 PR Interval:  142 QRS Duration: 86 QT Interval:  369 QTC Calculation: 426 R Axis:   92 Text Interpretation: Sinus rhythm Right axis deviation Borderline T wave abnormalities No significant change since last tracing Confirmed by Isla Pence [phone]) on 07/27/2022 3:02:42 PM  Radiology CT ANGIO HEAD NECK W WO CM  Result Date: 07/27/2022 CLINICAL DATA:  Syncope/presyncope.  Rule out cerebrovascular cause. EXAM: CT ANGIOGRAPHY HEAD AND NECK TECHNIQUE: Multidetector CT imaging of the head and neck was performed using the standard protocol during bolus administration of intravenous contrast. Multiplanar CT image reconstructions and MIPs were obtained to evaluate the vascular anatomy. Carotid stenosis measurements (when applicable) are obtained utilizing NASCET criteria, using the distal internal carotid diameter as the denominator. RADIATION DOSE REDUCTION: This exam was performed according to the departmental dose-optimization program which includes automated exposure control, adjustment of the mA and/or kV according to patient size and/or use of iterative reconstruction technique. CONTRAST:  121m OMNIPAQUE IOHEXOL 350 MG/ML SOLN COMPARISON:  CT head 04/17/2020 FINDINGS: CT HEAD  FINDINGS Brain: No  evidence of acute infarction, hemorrhage, hydrocephalus, extra-axial collection or mass lesion/mass effect. Vascular: Negative for hyperdense vessel Skull: Retromastoid craniotomy on the right. No acute skeletal abnormality Sinuses/Orbits: Paranasal sinuses clear. Mastoid clear. Negative orbit Other: None Review of the MIP images confirms the above findings CTA NECK FINDINGS Aortic arch: Standard branching. Imaged portion shows no evidence of aneurysm or dissection. No significant stenosis of the major arch vessel origins. Right carotid system: Normal right carotid system. Negative for atherosclerotic disease or dissection Left carotid system: Normal left carotid system. Negative for atherosclerotic disease or dissection Vertebral arteries: Normal vertebral arteries bilaterally. Both vertebral arteries are patent to the skull base without stenosis. Skeleton: No acute abnormality.  ACDF C6-7. Other neck: Negative for soft tissue mass or adenopathy in the neck. Upper chest: Lung apices clear bilaterally Review of the MIP images confirms the above findings CTA HEAD FINDINGS Anterior circulation: Internal carotid artery widely patent through the skull base and cavernous segment. Anterior and middle cerebral arteries normal bilaterally. No stenosis or large vessel occlusion. Negative for vascular malformation Posterior circulation: Both vertebral arteries patent to the basilar. PICA patent bilaterally. Basilar patent. Posterior cerebral arteries patent bilaterally. No stenosis or occlusion. Negative for vascular malformation Venous sinuses: Normal venous enhancement Anatomic variants: None Review of the MIP images confirms the above findings IMPRESSION: Negative CT head. Negative CTA head and neck. Electronically Signed   By: Franchot Gallo M.D.   On: 07/27/2022 18:42   DG Chest Portable 1 View  Result Date: 07/27/2022 CLINICAL DATA:  Syncope. EXAM: PORTABLE CHEST 1 VIEW COMPARISON:  01/26/2022  FINDINGS: The lungs are clear without focal pneumonia, edema, pneumothorax or pleural effusion. The cardiopericardial silhouette is within normal limits for size. Thoracic spinal stimulator device evident. The visualized bony structures of the thorax are unremarkable. IMPRESSION: No active disease. Electronically Signed   By: Misty Stanley M.D.   On: 07/27/2022 15:27    Procedures Procedures    Medications Ordered in ED Medications  diphenhydrAMINE (BENADRYL) 25 mg in sodium chloride 0.9 % 50 mL IVPB (0 mg Intravenous Stopped 07/27/22 1605)  lactated ringers bolus 1,000 mL (0 mLs Intravenous Stopped 07/27/22 1813)  prochlorperazine (COMPAZINE) injection 10 mg (10 mg Intravenous Given 07/27/22 1520)  iohexol (OMNIPAQUE) 350 MG/ML injection 100 mL (100 mLs Intravenous Contrast Given 07/27/22 1816)    ED Course/ Medical Decision Making/ A&P                            61 year old female with a history of back pain, trigeminal neuralgia, GERD, IBS, hypertension, hyperlipidemia, prediabetes who presents with concern for syncope.  DDx for syncope includes cardiac arrhythmia, MI, PE, electrolyte abnormality, hypovolemia including dehydration and anemia/GI bleed, infection.   EKG personally evaluated by me without acute abnormalities.  No chest pain or dyspnea, low suspicion for PE.  Reports headache, recent head trauma, nephew with aneurysmal hx, possible seizure-like activity.  Will obtain CTA head and neck.  BP 63W systolic.  Does report diarrhea, not much po intake today and dehydration may be contributor however awaiting labs including electrolytes, troponin, hgb at time of transfer of care.          Final Clinical Impression(s) / ED Diagnoses Final diagnoses:  Syncope, unspecified syncope type    Rx / DC Orders ED Discharge Orders     None         Gareth Morgan, MD 07/27/22 2334

## 2022-07-27 NOTE — ED Notes (Signed)
Ambulated pt, pt stated that she felt dizzy when she first stood up but did well as she started walking

## 2022-07-27 NOTE — Discharge Instructions (Addendum)
Hold the HCTZ.  Do not take your lisinopril unless your blood pressure is higher than 130 tomorrow morning.

## 2022-07-27 NOTE — ED Notes (Signed)
Patient transported to CT 

## 2022-07-27 NOTE — ED Provider Notes (Signed)
Pt signed out by Dr. Billy Fischer.  Pt's labs are normal other than a mild AKI (cr 1.17 today; it was 0.96 on 9/19); covid/flu neg; 2 troponins are nl.  CTA head/neck reviewed by me.  I agree with the radiologist.   Negative CT head. Negative CTA head and neck.   Pt's family brought in some [place] food which she ate.  She is feeling much better after eating.  BP is better (likely due to the salt in the food).  Pt is able to ambulate.  She said she felt a little dizzy when standing up, but was able to walk without problems.  Pt is told to hold her HCTZ.  She is to check her bp tomorrow morning before taking her lisinopril.  Syncope likely due to hypotension.  Sx have improved with fluids and with food.  However, pt knows she needs to f/u with pcp.  Return if worse.     Isla Pence, MD 07/27/22 [phone]

## 2022-07-28 ENCOUNTER — Ambulatory Visit: Payer: Medicare Other | Admitting: Gastroenterology

## 2022-07-28 ENCOUNTER — Encounter: Payer: Self-pay | Admitting: Family Medicine

## 2022-07-28 ENCOUNTER — Telehealth: Payer: Self-pay | Admitting: Gastroenterology

## 2022-07-28 DIAGNOSIS — R197 Diarrhea, unspecified: Secondary | ICD-10-CM | POA: Diagnosis not present

## 2022-07-28 DIAGNOSIS — R140 Abdominal distension (gaseous): Secondary | ICD-10-CM | POA: Diagnosis not present

## 2022-07-28 DIAGNOSIS — R55 Syncope and collapse: Secondary | ICD-10-CM | POA: Diagnosis not present

## 2022-07-28 NOTE — Telephone Encounter (Signed)
Patient is calling states she could not call us to cancel her appt this morning because she was in  the ED states that she can't wait to see Dr Silverio Decamp. Please advise

## 2022-07-29 NOTE — Telephone Encounter (Signed)
Spoke with the patient. She is experiencing abdominal pain and diarrhea. She is scheduled for an u/s through her PCP. Appointment moved to 08/03/22.

## 2022-08-03 ENCOUNTER — Ambulatory Visit: Payer: Medicare Other | Admitting: Nurse Practitioner

## 2022-08-04 ENCOUNTER — Other Ambulatory Visit: Payer: Self-pay | Admitting: Gastroenterology

## 2022-08-04 DIAGNOSIS — K5904 Chronic idiopathic constipation: Secondary | ICD-10-CM

## 2022-08-08 ENCOUNTER — Other Ambulatory Visit: Payer: Self-pay | Admitting: Gastroenterology

## 2022-08-20 NOTE — Progress Notes (Signed)
Obtained viral testing due to headache, diarrhea, evaluate for viral etiology such as covid, flu or rsv.

## 2022-08-23 NOTE — Progress Notes (Signed)
Chief Complaint:   OBESITY Cassidy Olsen is here to discuss her progress with her obesity treatment plan along with follow-up of her obesity related diagnoses. Cassidy Olsen is on the Category 1 Plan and keeping a food journal and adhering to recommended goals of 1100-1200 calories and 90+ protein and states she is following her eating plan approximately 50% of the time. Cassidy Olsen states she is walking 45 minutes 4 times per week.  Today's visit was #: 5 Starting weight: 188 LBS Starting date: 04/26/2022 Today's weight: 179 LBS Today's date: 07/26/2022 Total lbs lost to date: 9 LBS Total lbs lost since last in-office visit: 0  Interim History: Patient has had 2 nephews and 1 niece has in the last couple of weeks and has been difficult to focus on herself.  Patient also was recently sick.  Patient is walking Sunday, Monday, Thursday, Friday for 45 minutes.  Subjective:   1. Essential hypertension Blood pressure was up today because of a lot of stress with daughter and her granddaughter living with her.  Assessment/Plan:  No orders of the defined types were placed in this encounter.   Medications Discontinued During This Encounter  Medication Reason   nitrofurantoin, macrocrystal-monohydrate, (MACROBID) 100 MG capsule Completed Course     No orders of the defined types were placed in this encounter.    1. Essential hypertension Check blood pressure at home.  Decrease salt intake, increase exercise, continue PNP and weight loss.  Continue medications per PCP, Zastrow, HCTZ.  Stress management techniques discussed with patient and boundary setting.  2. Obesity, current BMI 31.7 Holiday eating guide and strategies given to patient today.  Holiday recipes given to patient today.  Try to get through holidays without gaining.  Cassidy Olsen is currently in the action stage of change. As such, her goal is to continue with weight loss efforts. She has agreed to the Category 1 Plan and keeping a food  journal and adhering to recommended goals of 1100-1200 calories and 90+ protein.   Exercise goals: For substantial health benefits, adults should do at least 150 minutes (2 hours and 30 minutes) a week of moderate-intensity, or 75 minutes (1 hour and 15 minutes) a week of vigorous-intensity aerobic physical activity, or an equivalent combination of moderate- and vigorous-intensity aerobic activity. Aerobic activity should be performed in episodes of at least 10 minutes, and preferably, it should be spread throughout the week.  Behavioral modification strategies: holiday eating strategies  and avoiding temptations.  Cassidy Olsen has agreed to follow-up with our clinic in 4-5 weeks. She was informed of the importance of frequent follow-up visits to maximize her success with intensive lifestyle modifications for her multiple health conditions.   Objective:   Blood pressure (!) 136/96, pulse 72, temperature 98.6 F (37 C), height '5\' 3"'$  (1.6 m), weight 179 lb (81.2 kg), SpO2 100 %. Body mass index is 31.71 kg/m.  General: Cooperative, alert, well developed, in no acute distress. HEENT: Conjunctivae and lids unremarkable. Cardiovascular: Regular rhythm.  Lungs: Normal work of breathing. Neurologic: No focal deficits.   Lab Results  Component Value Date   CREATININE 1.17 (H) 07/27/2022   BUN 30 (H) 07/27/2022   NA 137 07/27/2022   K 3.5 07/27/2022   CL 103 07/27/2022   CO2 24 07/27/2022   Lab Results  Component Value Date   ALT 28 07/27/2022   AST 21 07/27/2022   ALKPHOS 61 07/27/2022   BILITOT 0.8 07/27/2022   Lab Results  Component Value Date  HGBA1C 6.3 (H) 04/26/2022   HGBA1C 6.0 (H) 12/21/2020   Lab Results  Component Value Date   INSULIN 22.0 04/26/2022   Lab Results  Component Value Date   TSH 4.022 07/27/2022   Lab Results  Component Value Date   CHOL 180 04/26/2022   HDL 58 04/26/2022   LDLCALC 89 04/26/2022   TRIG 195 (H) 04/26/2022   CHOLHDL 3.1 04/26/2022    No results found for: "VD25OH" Lab Results  Component Value Date   WBC 12.5 (H) 07/27/2022   HGB 14.0 07/27/2022   HCT 41.7 07/27/2022   MCV 95.0 07/27/2022   PLT 368 07/27/2022   No results found for: "IRON", "TIBC", "FERRITIN"  Attestation Statements:   Reviewed by clinician on day of visit: allergies, medications, problem list, medical history, surgical history, family history, social history, and previous encounter notes.  Time spent on visit including pre-visit chart review and post-visit care and charting was 30 minutes.   I, Davy Pique, RMA, am acting as Location manager for [employer], DO.   I have reviewed the above documentation for accuracy and completeness, and I agree with the above. Marjory Sneddon, D.O.  The [place] was signed into law in 2016 which includes the topic of electronic health records.  This provides immediate access to information in MyChart.  This includes consultation notes, operative notes, office notes, lab results and pathology reports.  If you have any questions about what you read please let us know at your next visit so we can discuss your concerns and take corrective action if need be.  We are right here with you.

## 2022-08-30 ENCOUNTER — Ambulatory Visit: Payer: Medicare Other | Admitting: Gastroenterology

## 2022-08-30 ENCOUNTER — Ambulatory Visit: Payer: Medicare Other | Admitting: Physician Assistant

## 2022-08-30 DIAGNOSIS — G5621 Lesion of ulnar nerve, right upper limb: Secondary | ICD-10-CM | POA: Diagnosis not present

## 2022-08-30 DIAGNOSIS — M542 Cervicalgia: Secondary | ICD-10-CM | POA: Diagnosis not present

## 2022-08-30 DIAGNOSIS — M25511 Pain in right shoulder: Secondary | ICD-10-CM | POA: Diagnosis not present

## 2022-08-31 ENCOUNTER — Ambulatory Visit (INDEPENDENT_AMBULATORY_CARE_PROVIDER_SITE_OTHER): Payer: Medicare Other | Admitting: Family Medicine

## 2022-08-31 ENCOUNTER — Encounter: Payer: Self-pay | Admitting: Physician Assistant

## 2022-08-31 ENCOUNTER — Other Ambulatory Visit: Payer: Self-pay | Admitting: Physician Assistant

## 2022-08-31 ENCOUNTER — Ambulatory Visit (INDEPENDENT_AMBULATORY_CARE_PROVIDER_SITE_OTHER): Payer: Medicare Other | Admitting: Physician Assistant

## 2022-08-31 VITALS — BP 120/68 | HR 95 | Ht 63.0 in | Wt 183.4 lb

## 2022-08-31 DIAGNOSIS — R109 Unspecified abdominal pain: Secondary | ICD-10-CM | POA: Diagnosis not present

## 2022-08-31 DIAGNOSIS — K582 Mixed irritable bowel syndrome: Secondary | ICD-10-CM

## 2022-08-31 DIAGNOSIS — K219 Gastro-esophageal reflux disease without esophagitis: Secondary | ICD-10-CM | POA: Diagnosis not present

## 2022-08-31 MED ORDER — DICYCLOMINE HCL 10 MG PO CAPS: ORAL_CAPSULE | ORAL | 11 refills | Status: DC

## 2022-08-31 MED ORDER — LUBIPROSTONE 24 MCG PO CAPS: 24.0000 ug | ORAL_CAPSULE | Freq: Two times a day (BID) | ORAL | 3 refills | Status: DC

## 2022-08-31 MED ORDER — DEXLANSOPRAZOLE 60 MG PO CPDR: 60.0000 mg | DELAYED_RELEASE_CAPSULE | Freq: Every day | ORAL | 3 refills | Status: DC

## 2022-08-31 NOTE — Patient Instructions (Addendum)
We have sent the following medications to your pharmacy for you to pick up at your convenience: Amitiza 24 mg, Dexilant 60 mg & Dicyclomine 10 mg  We will contact you within the next few weeks to schedule an appointment with Dr.Nandigam.  It was a pleasure to see you today.  Thank you for trusting me with your gastrointestinal care.  Ellouise Newer, PA-C

## 2022-08-31 NOTE — Progress Notes (Signed)
Chief Complaint: Abdominal pain and diarrhea, history of IBS and GERD  HPI:    Cassidy Olsen is a 62 year old female with a past medical history as listed below including GERD and IBS, known to Dr. Silverio Decamp, who presents to clinic today for follow-up of her chronic symptoms of IBS and GERD with an increase in diarrhea reported.    12/24/2020 laparoscopic cholecystectomy.    06/15/2021 office visit with Dr. Silverio Decamp for follow-up of GERD and IBS with constipation/diarrhea.  Dicyclomine is helping with abdominal discomfort and upper abdominal pain had improved status postcholecystectomy.  At that time continued on Dexilant 30 mg.  She was told to use Carafate 1 g before meals and at bedtime as needed.  Continues on Amitiza 24 mcg twice daily for IBS constipation/diarrhea.  Also continued on Dicyclomine 10 mg every 8 hours.    07/27/2022 patient seen in the ER for syncope.  This was thought related to hypotension related to diarrhea.    Today, the patient tells me that she really wanted to follow-up with Dr. Silverio Decamp because she is due for her annual follow-up and she is her doctor.  Does describe though that she had about a month of extreme diarrhea about 10 times a day and was waking up in the middle of the night and had an episode of syncope which was thought related to hypotension in regards to dehydration from her diarrhea.  Since then her PCP recommended that she use her Dicyclomine on a daily basis and she is taking 10 mg in the morning and her diarrhea has slowed.  Does tell me that she never had to deal with diarrhea before and her IBS was always constipation, so this is somewhat new for her.  Reminds me of her cholecystectomy about a year and a half ago now.  Tells me that she also needs refills of her Amitiza 24 mcg twice daily, tells me when she is having diarrhea sometimes she will back off 1 dose, but if she stops it altogether then she will get constipated.  Tells me she has had over 30 years of  IBS symptoms and is aware of how to control them.  She continues Dexilant 60 mg daily which works well to control her reflux symptoms.    Denies fever, chills, weight loss or blood in her stool.  GI history:  Colonoscopy February 2018, small adenomatous colon polyp removed and medium-sized hemorrhoids   EGD February 2018 showed hiatal hernia and gastritis.  Biopsies negative for H. pylori.  Duodenal biopsies negative for celiac.   Patient had work-up in [place], HIDA scan and small bowel series insert negative for any significant pathology   Sister has celiac disease and her daughter has gluten sensitivity.  She had negative celiac testing, TTG IgA antibody was undetectable   S/p kidney stone removal 09/2019, has bilateral multiple small kidney stones was told it is residual fragments and she will eventually passed them     Abdominal ultrasound Jan 02, 2020: 1. Gallstones without sonographic evidence for acute cholecystitis or biliary dilatation 2. Bilateral kidney stones without hydronephrosis 3. Complicated cyst upper pole left kidney measuring 1.3 cm, could be more thoroughly characterized by MRI.   CT abdomen and pelvis with contrast December 2020 1. Obstructing 8 x 6 mm calculus at the left ureteropelvic junction resulting in mild-to-moderate left hydronephrosis with small volume perinephric fluid and stranding. Extensive left-sided urothelial enhancement and stranding raises suspicion for a superimposed infectious or inflammatory process. 2. Thickened appearance of  the urinary bladder, particularly along its superior margin. Recommend correlation with urinalysis. 3. Multiple nonobstructing right renal calculi, largest measuring up to 9 mm. 4. Cholelithiasis without evidence for cholecystitis. 5. Previously seen left adnexal cyst has slightly decreased in size.   Past Medical History:  Diagnosis Date   ADHD    Arthritis 2010   Asthma    Back pain    Chronic headaches     Chronic pain    Constipation    Edema of both lower extremities    Family history of breast cancer    Family history of prostate cancer    Gallstones 2018   GERD (gastroesophageal reflux disease)    High cholesterol    History of kidney stones    Hx: UTI (urinary tract infection)    Hypertension    IBS (irritable bowel syndrome)    Interstitial cystitis 2012   Joint pain    Kidney problem    Migraine    Occipital neuralgia    Osteoarthritis    PNA (pneumonia) 2018   Pre-diabetes    Spinal stenosis    Trigeminal neuralgia    Trigeminal neuralgia     Past Surgical History:  Procedure Laterality Date   ABDOMINAL HYSTERECTOMY     ANKLE SURGERY Left 05/2009   APPENDECTOMY     BACK SURGERY     Benign Tumor Removal Right 01/26/2017   right upper arm by Dr. Pershing Proud at [place]  2014   CHOLECYSTECTOMY N/A 12/24/2020   Procedure: LAPAROSCOPIC CHOLECYSTECTOMY;  Surgeon: Coralie Keens, MD;  Location: [facility];  Service: General;  Laterality: N/A;   CYSTOSCOPY W/ URETERAL STENT PLACEMENT Left 07/25/2019   Procedure: CYSTOSCOPY WITH RETROGRADE PYELOGRAM/URETERAL STENT PLACEMENT;  Surgeon: Franchot Gallo, MD;  Location: [facility];  Service: Urology;  Laterality: Left;   CYSTOSCOPY W/ URETERAL STENT REMOVAL Left 08/15/2019   Procedure: CYSTOSCOPY WITH STENT REMOVAL;  Surgeon: Franchot Gallo, MD;  Location: [facility];  Service: Urology;  Laterality: Left;   CYSTOSCOPY WITH RETROGRADE PYELOGRAM, URETEROSCOPY AND STENT PLACEMENT Left 08/15/2019   Procedure: CYSTOSCOPY WITH RETROGRADE PYELOGRAM, URETEROSCOPY;  Surgeon: Franchot Gallo, MD;  Location: [facility];  Service: Urology;  Laterality: Left;  [address] [place], URETEROSCOPY AND STENT PLACEMENT Right 09/12/2019   Procedure: CYSTOSCOPY WITH RIGHT RETROGRADE PYELOGRAM  URETEROSCOPY WITH HOLMIUM LASER STONE EXTRACTION AND STENT PLACEMENT;   Surgeon: Franchot Gallo, MD;  Location: [facility];  Service: Urology;  Laterality: Right;   DILATION AND CURETTAGE OF UTERUS     HOLMIUM LASER APPLICATION Left 42/68/3419   Procedure: HOLMIUM LASER APPLICATION;  Surgeon: Franchot Gallo, MD;  Location: [facility];  Service: Urology;  Laterality: Left;   JOINT REPLACEMENT     R knee   KIDNEY STONE SURGERY     neuro spine similator  01/2021   OVARIAN CYST REMOVAL  05/2010   right knee revision  2016   ROTATOR CUFF REPAIR Right 02/2019   SPINE SURGERY     TUBAL LIGATION      Current Outpatient Medications  Medication Sig Dispense Refill   ACETAMINOPHEN PO Take 650 mg by mouth every 8 (eight) hours as needed for moderate pain.     albuterol (VENTOLIN HFA) 108 (90 Base) MCG/ACT inhaler Inhale 2 puffs into the lungs every 6 (six) hours as needed for wheezing or shortness of breath. 18 g 11   albuterol (VENTOLIN HFA) 108 (  90 Base) MCG/ACT inhaler Inhale 2 puffs into the lungs every 6 (six) hours as needed for wheezing or shortness of breath. 8 g 6   AMBULATORY NON FORMULARY MEDICATION 1 tablet 2 (two) times daily. Medication Name: Migrelief     amitriptyline (ELAVIL) 50 MG tablet Take 1 tablet (50 mg total) by mouth at bedtime. 90 tablet 3   atorvastatin (LIPITOR) 20 MG tablet Take 1 tablet (20 mg total) by mouth daily. 90 tablet 4   botulinum toxin Type A (BOTOX) 200 units injection INJECT 155 UNITS INTRAMUSCULARLY EVERY 12 WEEKS (GIVEN AT MD  OFFICE, DISCARD UNUSED) 1 each 1   budesonide-formoterol (SYMBICORT) 80-4.5 MCG/ACT inhaler Inhale 2 puffs into the lungs 2 (two) times daily as needed (asthma). 1 each 11   celecoxib (CELEBREX) 200 MG capsule Take 200 mg by mouth 2 (two) times daily.     dexlansoprazole (DEXILANT) 60 MG capsule TAKE 1 CAPSULE BY MOUTH EVERY DAY 90 capsule 0   dicyclomine (BENTYL) 10 MG capsule TAKE 1 TO 2 CAPSULES BY MOUTH EVERY 8 HOURS AS NEEDED 540 capsule 0   fluticasone (FLONASE) 50 MCG/ACT nasal spray Place 2 sprays  into both nostrils daily. 16 g 11   Fremanezumab-vfrm (AJOVY) 225 MG/1.5ML SOAJ Inject 225 mg into the skin every 30 (thirty) days. 4.5 mL 3   gabapentin (NEURONTIN) 300 MG capsule Take 2 capsules (600 mg total) by mouth 3 (three) times daily. 540 capsule 3   gabapentin (NEURONTIN) 300 MG capsule Take 3 capsules (900 mg total) by mouth 3 (three) times daily. 270 capsule 0   lisinopril (ZESTRIL) 10 MG tablet Take 10 mg by mouth daily.     lubiprostone (AMITIZA) 24 MCG capsule TAKE 1 CAPSULE (24 MCG TOTAL) BY MOUTH 2 (TWO) TIMES DAILY WITH A MEAL. 180 capsule 0   montelukast (SINGULAIR) 10 MG tablet Take 1 tablet (10 mg total) by mouth at bedtime. 30 tablet 11   Spacer/Aero-Holding Chambers (AEROCHAMBER MV) inhaler Use as instructed 1 each 0   valACYclovir (VALTREX) 1000 MG tablet Take 1 tablet (1,000 mg total) by mouth 3 (three) times daily. 21 tablet 0   valACYclovir (VALTREX) 500 MG tablet Take 500 mg by mouth 2 (two) times daily as needed (outbreaks).      No current facility-administered medications for this visit.    Allergies as of 08/31/2022 - Review Complete 07/27/2022  Allergen Reaction Noted   Aspirin Other (See Comments), Nausea And Vomiting, and Nausea Only 04/18/2013   Ketorolac tromethamine Shortness Of Breath, Itching, and Other (See Comments) 08/14/2019   Penicillins Hives, Itching, Rash, and Other (See Comments) 03/19/1994   Codeine Other (See Comments) 07/17/2018   Ibuprofen Other (See Comments) and Nausea And Vomiting 09/03/2016   Latex Rash and Other (See Comments) 03/07/2011   Omeprazole magnesium Other (See Comments) 12/25/2019   Pantoprazole sodium Other (See Comments) 12/25/2019   Penicillamine Rash 12/26/2019   Pregabalin Other (See Comments) 12/25/2019   Pyridium [phenazopyridine hcl] Swelling 06/02/2022    Family History  Problem Relation Age of Onset   Ulcers Mother 57       peptic ulcer   Irritable bowel syndrome Mother    High blood pressure Mother     Sudden death Mother    Depression Mother    Prostate cancer Father 74   Heart disease Father    Irritable bowel syndrome Father    High blood pressure Father    Breast cancer Sister 36       ER+/PR+/Her2-  breast cancer   Alzheimer's disease Sister    Breast cancer Paternal Grandmother    Diabetes Paternal Grandmother    Cancer Paternal Uncle        unknown cancers    Social History   Socioeconomic History   Marital status: Single    Spouse name: Not on file   Number of children: 1   Years of education: Not on file   Highest education level: Not on file  Occupational History   Occupation: retired   Occupation: Ship broker  Tobacco Use   Smoking status: Never   Smokeless tobacco: Never  Scientific laboratory technician Use: Never used  Substance and Sexual Activity   Alcohol use: Yes    Comment: socially   Drug use: No   Sexual activity: Not on file  Other Topics Concern   Not on file  Social History Narrative   Not on file   Social Determinants of Health   Financial Resource Strain: Not on file  Food Insecurity: Not on file  Transportation Needs: Not on file  Physical Activity: Not on file  Stress: Not on file  Social Connections: Not on file  Intimate Partner Violence: Not on file    Review of Systems:    Constitutional: No weight loss, fever or chills Skin: No rash  Cardiovascular: No chest pain  Respiratory: No SOB  Gastrointestinal: See HPI and otherwise negative Genitourinary: No dysuria  Neurological: No headache Musculoskeletal: No new muscle or joint pain Hematologic: No bleeding  Psychiatric: No history of depression or anxiety   Physical Exam:  Vital signs: BP 120/68   Pulse 95   Ht '5\' 3"'$  (1.6 m)   Wt 183 lb 6.4 oz (83.2 kg)   SpO2 95%   BMI 32.49 kg/m    Constitutional:   AA female appears to be in NAD, Well developed, Well nourished, alert and cooperative Head:  Normocephalic and atraumatic. Eyes:   PEERL, EOMI. No icterus. Conjunctiva  pink. Ears:  Normal auditory acuity. Neck:  Supple Throat: Oral cavity and pharynx without inflammation, swelling or lesion.  Respiratory: Respirations even and unlabored. Lungs clear to auscultation bilaterally.   No wheezes, crackles, or rhonchi.  Cardiovascular: Normal S1, S2. No MRG. Regular rate and rhythm. No peripheral edema, cyanosis or pallor.  Gastrointestinal:  Soft, nondistended, nontender. No rebound or guarding. Normal bowel sounds. No appreciable masses or hepatomegaly. Rectal:  Not performed.  Msk:  Symmetrical without gross deformities. Without edema, no deformity or joint abnormality.  Neurologic:  Alert and  oriented x4;  grossly normal neurologically.  Skin:   Dry and intact without significant lesions or rashes. Psychiatric: Demonstrates good judgement and reason without abnormal affect or behaviors.  RELEVANT LABS AND IMAGING: CBC    Component Value Date/Time   WBC 12.5 (H) 07/27/2022 1410   RBC 4.39 07/27/2022 1410   HGB 14.0 07/27/2022 1410   HGB 13.5 04/26/2022 1050   HCT 41.7 07/27/2022 1410   HCT 40.4 04/26/2022 1050   PLT 368 07/27/2022 1410   PLT 306 04/26/2022 1050   MCV 95.0 07/27/2022 1410   MCV 94 04/26/2022 1050   MCH 31.9 07/27/2022 1410   MCHC 33.6 07/27/2022 1410   RDW 13.5 07/27/2022 1410   RDW 14.3 04/26/2022 1050   LYMPHSABS 4.9 (H) 07/27/2022 1410   LYMPHSABS 2.2 04/26/2022 1050   MONOABS 0.8 07/27/2022 1410   EOSABS 0.2 07/27/2022 1410   EOSABS 0.2 04/26/2022 1050   BASOSABS 0.1 07/27/2022 1410  BASOSABS 0.1 04/26/2022 1050    CMP     Component Value Date/Time   NA 137 07/27/2022 1410   NA 139 04/26/2022 1050   K 3.5 07/27/2022 1410   CL 103 07/27/2022 1410   CO2 24 07/27/2022 1410   GLUCOSE 107 (H) 07/27/2022 1410   BUN 30 (H) 07/27/2022 1410   BUN 18 04/26/2022 1050   CREATININE 1.17 (H) 07/27/2022 1410   CALCIUM 9.3 07/27/2022 1410   PROT 7.4 07/27/2022 1410   PROT 7.0 04/26/2022 1050   ALBUMIN 4.1 07/27/2022 1410    ALBUMIN 4.5 04/26/2022 1050   AST 21 07/27/2022 1410   ALT 28 07/27/2022 1410   ALKPHOS 61 07/27/2022 1410   BILITOT 0.8 07/27/2022 1410   BILITOT <0.2 04/26/2022 1050   GFRNONAA 53 (L) 07/27/2022 1410   GFRAA 59 (L) 12/09/2019 1616    Assessment: 1. GERD: Controlled on Dexilant 60 mg daily 2.  IBS-Mixed: Recently has been having more diarrhea than anything else; query IBS-D +/- postcholecystectomy diarrhea 3. Abdominal cramping: with above  Plan: 1.  Discussed possibility of postcholecystectomy diarrhea with the patient. 2.  For now the Dicyclomine seems to be helping but she still occasionally has diarrhea.  Recommend she use Dicyclomine 10 mg twice daily, in the morning and in the evening to see if this helps more.  Discussed that she can titrate this up to 20 mg 4 times a day if she needs to. 3.  Discussed with the patient that if she is still having problems with this change in bowel habits at time of follow-up with Dr. Silverio Decamp they may want to discuss an earlier colonoscopy.  Her last was in 2018. 4.  Refilled Dexilant 60 mg daily #90 with 3 refills 5.  Refilled Amitiza 24 mcg twice daily #180 with 3 refills 6.  Went ahead and refilled Dicyclomine 10 mg twice daily, #60 with 11 refills 7.  Patient to follow in clinic with Dr. Silverio Decamp only per her request at her next available.  Ellouise Newer, PA-C [facility] 08/31/2022, 2:32 PM  Cc: Loni Beckwith, MD

## 2022-08-31 NOTE — Addendum Note (Signed)
Addended by: Annabell Howells on: 08/31/2022 03:56 PM   Modules accepted: Orders

## 2022-09-01 DIAGNOSIS — N3946 Mixed incontinence: Secondary | ICD-10-CM | POA: Diagnosis not present

## 2022-09-01 DIAGNOSIS — N9419 Other specified dyspareunia: Secondary | ICD-10-CM | POA: Diagnosis not present

## 2022-09-01 DIAGNOSIS — R3915 Urgency of urination: Secondary | ICD-10-CM | POA: Diagnosis not present

## 2022-09-01 DIAGNOSIS — N179 Acute kidney failure, unspecified: Secondary | ICD-10-CM | POA: Diagnosis not present

## 2022-09-01 DIAGNOSIS — I10 Essential (primary) hypertension: Secondary | ICD-10-CM | POA: Diagnosis not present

## 2022-09-01 DIAGNOSIS — R55 Syncope and collapse: Secondary | ICD-10-CM | POA: Diagnosis not present

## 2022-09-01 DIAGNOSIS — J4520 Mild intermittent asthma, uncomplicated: Secondary | ICD-10-CM | POA: Diagnosis not present

## 2022-09-02 ENCOUNTER — Other Ambulatory Visit: Payer: Self-pay | Admitting: Physician Assistant

## 2022-09-05 NOTE — Telephone Encounter (Signed)
Please advise 

## 2022-09-17 ENCOUNTER — Other Ambulatory Visit: Payer: Self-pay | Admitting: Pulmonary Disease

## 2022-09-20 ENCOUNTER — Other Ambulatory Visit (HOSPITAL_COMMUNITY): Payer: Self-pay

## 2022-09-20 ENCOUNTER — Telehealth: Payer: Self-pay | Admitting: Adult Health

## 2022-09-20 NOTE — Telephone Encounter (Signed)
Received message from [facility] stating they tried reaching out to pt to collect $60 copay before shipping her Botox. They state pt became upset, began yelling and talking over them. They state she was angry over the copay as well as a bill she received from our office. They were not able to relay much information as she would not let them get a word in. Sent message to pt informing her that she cannot have upcoming 2/26 Botox appointment without paying [facility] her copay.

## 2022-09-21 DIAGNOSIS — M25511 Pain in right shoulder: Secondary | ICD-10-CM | POA: Diagnosis not present

## 2022-09-21 DIAGNOSIS — M25512 Pain in left shoulder: Secondary | ICD-10-CM | POA: Diagnosis not present

## 2022-09-22 DIAGNOSIS — R0989 Other specified symptoms and signs involving the circulatory and respiratory systems: Secondary | ICD-10-CM | POA: Diagnosis not present

## 2022-09-22 DIAGNOSIS — R1310 Dysphagia, unspecified: Secondary | ICD-10-CM | POA: Diagnosis not present

## 2022-09-22 DIAGNOSIS — K219 Gastro-esophageal reflux disease without esophagitis: Secondary | ICD-10-CM | POA: Diagnosis not present

## 2022-09-26 ENCOUNTER — Ambulatory Visit (INDEPENDENT_AMBULATORY_CARE_PROVIDER_SITE_OTHER): Payer: Self-pay | Admitting: Family Medicine

## 2022-09-26 ENCOUNTER — Encounter (INDEPENDENT_AMBULATORY_CARE_PROVIDER_SITE_OTHER): Payer: Self-pay

## 2022-09-26 ENCOUNTER — Encounter (INDEPENDENT_AMBULATORY_CARE_PROVIDER_SITE_OTHER): Payer: Self-pay | Admitting: Family Medicine

## 2022-09-26 VITALS — BP 165/113 | HR 94 | Temp 98.0°F | Ht 63.0 in | Wt 181.0 lb

## 2022-09-26 DIAGNOSIS — Z91199 Patient's noncompliance with other medical treatment and regimen due to unspecified reason: Secondary | ICD-10-CM

## 2022-09-27 ENCOUNTER — Telehealth (INDEPENDENT_AMBULATORY_CARE_PROVIDER_SITE_OTHER): Payer: Medicare Other | Admitting: Family Medicine

## 2022-09-27 ENCOUNTER — Encounter (INDEPENDENT_AMBULATORY_CARE_PROVIDER_SITE_OTHER): Payer: Self-pay | Admitting: Family Medicine

## 2022-09-27 DIAGNOSIS — E669 Obesity, unspecified: Secondary | ICD-10-CM | POA: Diagnosis not present

## 2022-09-27 DIAGNOSIS — R7303 Prediabetes: Secondary | ICD-10-CM

## 2022-09-27 DIAGNOSIS — Z6832 Body mass index (BMI) 32.0-32.9, adult: Secondary | ICD-10-CM

## 2022-09-27 DIAGNOSIS — I10 Essential (primary) hypertension: Secondary | ICD-10-CM

## 2022-09-27 NOTE — Progress Notes (Signed)
TeleHealth Visit:  This visit was completed with telemedicine (audio/video) technology. Cassidy Olsen has verbally consented to this TeleHealth visit. The patient is located at home, the provider is located at home. The participants in this visit include the listed provider and patient. The visit was conducted today via MyChart video.  OBESITY Cassidy Olsen is here to discuss her progress with her obesity treatment plan along with follow-up of her obesity related diagnoses.   Today's visit was # 6 Starting weight: 188 lbs Starting date: 04/26/22 Weight in office on 09/26/2022: 191 pounds. Total weight loss: 0 on 09/26/2022. Today's reported weight: No weight reported.  Nutrition Plan: the Category 1 plan and keeping a food journal with goal of 1100-1200 calories and 90+ grams of protein daily.  Current exercise:  walking 30-45 minutes 5 times per week.  Interim History:  She has been off plan.  She has had a lot of stress recently with her daughter and 18 year old granddaughter living with her.   Often skips meals.  Hunger is not an issue for her. She is planning on getting back on plan this Saturday (2/24).  She is determined to follow the plan and journal.  She really likes journaling with [employer]. 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  She closes her exercise ring on her Apple watch 5 days/week.  Assessment/Plan:  1. Hypertension Hypertension poorly controlled.  PCP stopped her thiazide diuretic because she was having some low blood pressures.  Since then blood pressures have been very high. Medication(s): Lisinopril 10 mg daily  BP Readings from Last 3 Encounters:  09/26/22 (!) 165/113  08/31/22 120/68  07/27/22 (!) 119/92   Lab Results  Component Value Date   CREATININE 1.17 (H) 07/27/2022   CREATININE 0.96 04/26/2022   CREATININE 1.00 12/21/2020   No results found for: "GFR"  Plan: Continue lisinopril 10 mg daily FU in 2 months with PCP as  scheduled.  Send him a MyChart message sooner if blood pressures are consistently greater than 140/90.   2. Prediabetes Last A1c was 6.3 on 04/26/2022.  Sharmon reports this is the highest its ever been. Medication(s): none Lab Results  Component Value Date   HGBA1C 6.3 (H) 04/26/2022   Lab Results  Component Value Date   INSULIN 22.0 04/26/2022    Plan: Check A1c next visit. Consider adding metformin.  Last GFR 53.   3. Obesity: Current BMI 77 Cassidy Olsen is currently in the action stage of change. As such, her goal is to continue with weight loss efforts.  She has agreed to the Category 1 plan and keeping a food journal with goal of 1100-1200 calories and 90 grams of protein daily.  Exercise goals:  as is  Behavioral modification strategies: increasing lean protein intake, decreasing simple carbohydrates , no meal skipping, meal planning , planning for success, and increase frequency of journaling.  Cassidy Olsen has agreed to follow-up with our clinic in 5 weeks.   No orders of the defined types were placed in this encounter.   There are no discontinued medications.   No orders of the defined types were placed in this encounter.     Objective:   VITALS: Per patient if applicable, see vitals. GENERAL: Alert and in no acute distress. CARDIOPULMONARY: No increased WOB. Speaking in clear sentences.  PSYCH: Pleasant and cooperative. Speech normal rate and rhythm. Affect is appropriate. Insight and judgement are appropriate. Attention is focused, linear, and appropriate.  NEURO: Oriented as arrived to appointment on time  with no prompting.   Lab Results  Component Value Date   CREATININE 1.17 (H) 07/27/2022   BUN 30 (H) 07/27/2022   NA 137 07/27/2022   K 3.5 07/27/2022   CL 103 07/27/2022   CO2 24 07/27/2022   Lab Results  Component Value Date   ALT 28 07/27/2022   AST 21 07/27/2022   ALKPHOS 61 07/27/2022   BILITOT 0.8 07/27/2022   Lab Results  Component Value Date    HGBA1C 6.3 (H) 04/26/2022   HGBA1C 6.0 (H) 12/21/2020   Lab Results  Component Value Date   INSULIN 22.0 04/26/2022   Lab Results  Component Value Date   TSH 4.022 07/27/2022   Lab Results  Component Value Date   CHOL 180 04/26/2022   HDL 58 04/26/2022   LDLCALC 89 04/26/2022   TRIG 195 (H) 04/26/2022   CHOLHDL 3.1 04/26/2022   Lab Results  Component Value Date   WBC 12.5 (H) 07/27/2022   HGB 14.0 07/27/2022   HCT 41.7 07/27/2022   MCV 95.0 07/27/2022   PLT 368 07/27/2022   No results found for: "IRON", "TIBC", "FERRITIN" No results found for: "VD25OH"  Attestation Statements:   Reviewed by clinician on day of visit: allergies, medications, problem list, medical history, surgical history, family history, social history, and previous encounter notes.  Time spent on visit including the items listed below was 35 minutes.  -preparing to see the patient (e.g., review of tests, history, previous notes) -obtaining and/or reviewing separately obtained history -counseling and educating the patient/family/caregiver -documenting clinical information in the electronic or other health record -ordering medications, tests, or procedures -independently interpreting results and communicating results to the patient/ family/caregiver -referring and communicating with other health care professionals  -care coordination   This was prepared with the assistance of [employer].  Occasional wrong-word or sound-a-like substitutions may have occurred due to the inherent limitations of voice recognition software.

## 2022-09-28 ENCOUNTER — Other Ambulatory Visit (HOSPITAL_COMMUNITY): Payer: Self-pay

## 2022-09-29 ENCOUNTER — Other Ambulatory Visit (HOSPITAL_COMMUNITY): Payer: Self-pay

## 2022-09-29 ENCOUNTER — Other Ambulatory Visit: Payer: Self-pay
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
## 2022-10-03 ENCOUNTER — Encounter: Payer: Self-pay | Admitting: Family Medicine

## 2022-10-03 ENCOUNTER — Ambulatory Visit (INDEPENDENT_AMBULATORY_CARE_PROVIDER_SITE_OTHER): Payer: Medicare Other | Admitting: Family Medicine

## 2022-10-03 VITALS — BP 125/83

## 2022-10-03 DIAGNOSIS — G43709 Chronic migraine without aura, not intractable, without status migrainosus: Secondary | ICD-10-CM | POA: Diagnosis not present

## 2022-10-03 MED ORDER — ONABOTULINUMTOXINA 200 UNITS IJ SOLR
155.0000 [IU] | Freq: Once | INTRAMUSCULAR | Status: AC
  Administered 2022-10-03: 155 [IU] via INTRAMUSCULAR

## 2022-10-03 NOTE — Progress Notes (Signed)
.  Botox- 100 units x 2 vials [id number] Expiration:01/2025 NDC: [id number]  Bacteriostatic 0.9% Sodium Chloride- 91m total Lot: [id number]: 11/25 NDC: [id number] Dx: [id number]/P

## 2022-10-04 ENCOUNTER — Ambulatory Visit: Payer: Medicare Other | Admitting: Family Medicine

## 2022-10-06 ENCOUNTER — Ambulatory Visit
Admission: RE | Admit: 2022-10-06 | Discharge: 2022-10-06 | Disposition: A | Discharge status: Home / Self Care | Payer: Medicare Other | Source: Ambulatory Visit | Attending: Nurse Practitioner | Admitting: Nurse Practitioner

## 2022-10-06 DIAGNOSIS — M85852 Other specified disorders of bone density and structure, left thigh: Secondary | ICD-10-CM | POA: Diagnosis not present

## 2022-10-06 DIAGNOSIS — N179 Acute kidney failure, unspecified: Secondary | ICD-10-CM | POA: Diagnosis not present

## 2022-10-06 DIAGNOSIS — M85851 Other specified disorders of bone density and structure, right thigh: Secondary | ICD-10-CM

## 2022-10-06 DIAGNOSIS — Z780 Asymptomatic menopausal state: Secondary | ICD-10-CM | POA: Diagnosis not present

## 2022-10-12 ENCOUNTER — Ambulatory Visit (INDEPENDENT_AMBULATORY_CARE_PROVIDER_SITE_OTHER): Payer: Medicare Other | Admitting: Podiatry

## 2022-10-12 ENCOUNTER — Ambulatory Visit (INDEPENDENT_AMBULATORY_CARE_PROVIDER_SITE_OTHER): Payer: Medicare Other

## 2022-10-12 ENCOUNTER — Encounter: Payer: Self-pay | Admitting: Podiatry

## 2022-10-12 DIAGNOSIS — M722 Plantar fascial fibromatosis: Secondary | ICD-10-CM

## 2022-10-12 DIAGNOSIS — M545 Low back pain, unspecified: Secondary | ICD-10-CM | POA: Diagnosis not present

## 2022-10-12 DIAGNOSIS — M5459 Other low back pain: Secondary | ICD-10-CM | POA: Diagnosis not present

## 2022-10-12 MED ORDER — TRIAMCINOLONE ACETONIDE 10 MG/ML IJ SUSP
10.0000 mg | Freq: Once | INTRAMUSCULAR | Status: AC
  Administered 2022-10-12: 10 mg

## 2022-10-12 NOTE — Progress Notes (Signed)
Subjective:   Patient ID: Cassidy Olsen, female   DOB: 62 y.o.   MRN: [id number]   HPI Patient states she has developed severe pain in her right heel of 1 day duration and does not remember specific injury but has trouble putting her heel on the ground.  Patient is going out of town is desperate for some kind of relief   ROS      Objective:  Physical Exam  Neurovascular status intact exquisite discomfort plantar posterior aspect of the right heel with fluid buildup around the area negative currently for stress fracture     Assessment:  Severe inflammatory condition with plantar proximal inflammation the heel inability to walk on the heel     Plan:  H&P x-ray reviewed and today I went ahead I did inject the proximal plantar portion 3 mg Kenalog 5 mg Xylocaine and due to the intensity of discomfort I applied air fracture walker which seems to relieve relieve the pain.  It was fitted properly to her lower leg all education given.  X-rays indicate no signs currently of stress fracture or spur formation noted

## 2022-10-12 NOTE — Progress Notes (Signed)
Not seen

## 2022-10-14 ENCOUNTER — Other Ambulatory Visit: Payer: Self-pay | Admitting: Cardiovascular Disease

## 2022-10-19 ENCOUNTER — Encounter (HOSPITAL_COMMUNITY): Payer: Self-pay | Admitting: Orthopedic Surgery

## 2022-10-19 ENCOUNTER — Other Ambulatory Visit (HOSPITAL_COMMUNITY): Payer: Self-pay | Admitting: Orthopedic Surgery

## 2022-10-19 DIAGNOSIS — M5459 Other low back pain: Secondary | ICD-10-CM

## 2022-10-25 NOTE — Progress Notes (Signed)
Cassidy Olsen    [id number]    May 05, 1961  Primary Care Physician:Samal, Dagoberto Reef, MD  Referring Physician: Woodfin Ganja, MD [address] [place],  [place] [address]   Chief complaint:  Diarrhea Chief Complaint  Patient presents with   Abdominal Pain    C/o continued progressively worsened periumbilical abdominal "scratchiness" which originally started in March 2024. Also c/o constipation. Has been taking dicyclomine for discomfort. Patient notes that it does seem to help her loose stools some but no longer helps with abdominal discomfort. No blood noted in stool.    HPI:  62 year old very pleasant female here for follow up visit for GERD and IBS with constipation and diarrhea Dicyclomine helps with intermittent abdominal discomfort and cramping Upper quadrant abdominal pain improved s/p cholecystectomy, she continues to have intermittent discomfort   I last saw her on 06-15-21. At that time she was experiencing abdominal discomfort/ burning sensation. She had occasional diarrhea and having feelings of incomplete evacuation. She was taking Amitiza 24 mcg BID. She was taking Dicyclomine for abdominal discomfort and GERD symptoms were stable on Dexilant 60 mg daily. She was no longer taking Metamucil or Miralax.      Today, she reports experiencing epigastric and abdominal pain that tends to waxes and wanes in severity. She states that she experiences her pain at least once a day and states that "it's always there". She states that she would experience pain while eating and drinking as well. She states that the pain is similar to the pain she was experiencing when having her gallbladder removed. She reports that she's been watching what she eats yet is still experience the pain.   She reports experiencing abdominal discomfort which she describes it as "scratching from the inside".  She reports that she's been taking dicyclomine 10-20 mg 2x daily. She states that the  dicyclomine has helped with her with diarrhea but does not help with her abdominal pain.  She felt it was working better for her when she was taking it as needed.  Scheduled  She denies any nausea, vomiting, melena or bright red blood per rectum  She reports that she has taken her shingles vaccine last year.  She denies any skin rash  GI Hx:  Echo 07-11-22 1.Left ventricular ejection fraction, by estimation, is 70 to 75%. The left ventricle has hyperdynamic function. The left ventricle has no regional wall motion abnormalities. There is mild concentric left ventricular hypertrophy. Left ventricular diastolic parameters are consistent with Grade I diastolic dysfunction (impaired relaxation).  2. Right ventricular systolic function is normal.  3.The right ventricular size is normal. The mitral valve is normal in structure. Trivial mitral valve regurgitation. No evidence of mitral stenosis.  4. The aortic valve is tricuspid. There is mild thickening of the aortic valve. Aortic valve regurgitation is not visualized. Aortic valve sclerosis is present, with no evidence of aortic valve stenosis.  5. The inferior vena cava is normal in size with greater than 50% respiratory variability, suggesting right atrial pressure of 3 mmHg.  Colonoscopy February 2018, small adenomatous colon polyp removed and medium-sized hemorrhoids   EGD February 2018 showed hiatal hernia and gastritis.  Biopsies negative for H. pylori.  Duodenal biopsies negative for celiac.   Patient had work-up in [place], HIDA scan and small bowel series insert negative for any significant pathology   Sister has celiac disease and her daughter has gluten sensitivity.  She had negative celiac testing, TTG IgA  antibody was undetectable   S/p kidney stone removal 09/2019, has bilateral multiple small kidney stones was told it is residual fragments and she will eventually passed them     Abdominal ultrasound Jan 02, 2020: 1. Gallstones  without sonographic evidence for acute cholecystitis or biliary dilatation 2. Bilateral kidney stones without hydronephrosis 3. Complicated cyst upper pole left kidney measuring 1.3 cm, could be more thoroughly characterized by MRI.   CT abdomen and pelvis with contrast December 2020 1. Obstructing 8 x 6 mm calculus at the left ureteropelvic junction resulting in mild-to-moderate left hydronephrosis with small volume perinephric fluid and stranding. Extensive left-sided urothelial enhancement and stranding raises suspicion for a superimposed infectious or inflammatory process. 2. Thickened appearance of the urinary bladder, particularly along its superior margin. Recommend correlation with urinalysis. 3. Multiple nonobstructing right renal calculi, largest measuring up to 9 mm. 4. Cholelithiasis without evidence for cholecystitis. 5. Previously seen left adnexal cyst has slightly decreased in size.   Current Outpatient Medications:    ACETAMINOPHEN PO, Take 1,300 mg by mouth 2 (two) times daily., Disp: , Rfl:    albuterol (VENTOLIN HFA) 108 (90 Base) MCG/ACT inhaler, Inhale 2 puffs into the lungs every 6 (six) hours as needed for wheezing or shortness of breath., Disp: 8 g, Rfl: 6   AMBULATORY NON FORMULARY MEDICATION, 1 tablet 2 (two) times daily. Medication Name: Migrelief, Disp: , Rfl:    amitriptyline (ELAVIL) 50 MG tablet, Take 1 tablet (50 mg total) by mouth at bedtime., Disp: 90 tablet, Rfl: 3   atorvastatin (LIPITOR) 20 MG tablet, TAKE 1 TABLET BY MOUTH EVERY DAY, Disp: 30 tablet, Rfl: 3   botulinum toxin Type A (BOTOX) 200 units injection, INJECT 155 UNITS INTRAMUSCULARLY EVERY 12 WEEKS (GIVEN AT MD  OFFICE, DISCARD UNUSED), Disp: 1 each, Rfl: 1   budesonide-formoterol (SYMBICORT) 80-4.5 MCG/ACT inhaler, Inhale 2 puffs into the lungs 2 (two) times daily as needed (asthma)., Disp: 1 each, Rfl: 11   celecoxib (CELEBREX) 200 MG capsule, Take 200 mg by mouth 2 (two) times daily.,  Disp: , Rfl:    dexlansoprazole (DEXILANT) 60 MG capsule, Take 1 capsule by mouth daily., Disp: , Rfl:    dicyclomine (BENTYL) 10 MG capsule, Take 1 by mouth twice daily. (Patient taking differently: Take 20 mg by mouth in the morning and at bedtime.), Disp: 60 capsule, Rfl: 11   fluticasone (FLONASE) 50 MCG/ACT nasal spray, Place 2 sprays into both nostrils daily., Disp: 16 g, Rfl: 11   Fremanezumab-vfrm (AJOVY) 225 MG/1.5ML SOAJ, Inject 225 mg into the skin every 30 (thirty) days., Disp: 4.5 mL, Rfl: 3   gabapentin (NEURONTIN) 300 MG capsule, Take 3 capsules (900 mg total) by mouth 3 (three) times daily., Disp: 270 capsule, Rfl: 0   lisinopril (ZESTRIL) 10 MG tablet, Take 10 mg by mouth daily., Disp: , Rfl:    lubiprostone (AMITIZA) 24 MCG capsule, Take 1 capsule (24 mcg total) by mouth 2 (two) times daily with a meal., Disp: 180 capsule, Rfl: 3   montelukast (SINGULAIR) 10 MG tablet, TAKE 1 TABLET BY MOUTH EVERYDAY AT BEDTIME, Disp: 90 tablet, Rfl: 3   Omega-3 Fatty Acids (OMEGA III EPA+DHA) 1000 MG CAPS, Take by mouth., Disp: , Rfl:    Spacer/Aero-Holding Chambers (AEROCHAMBER MV) inhaler, Use as instructed, Disp: 1 each, Rfl: 0   valACYclovir (VALTREX) 500 MG tablet, Take 500 mg by mouth 2 (two) times daily as needed (outbreaks). , Disp: , Rfl:     Allergies as of 11/14/2022 -  Review Complete 11/14/2022  Allergen Reaction Noted   Aspirin Other (See Comments), Nausea And Vomiting, and Nausea Only 04/18/2013   Ketorolac Hives 08/31/2022   Ketorolac tromethamine Shortness Of Breath, Itching, and Other (See Comments) 08/14/2019   Penicillins Hives, Itching, Rash, and Other (See Comments) 03/19/1994   Codeine Other (See Comments) 07/17/2018   Ibuprofen Other (See Comments) and Nausea And Vomiting 09/03/2016   Latex Rash and Other (See Comments) 03/07/2011   Omeprazole magnesium Other (See Comments) 12/25/2019   Pantoprazole sodium Other (See Comments) 12/25/2019   Penicillamine Rash  12/26/2019   Pregabalin Other (See Comments) 12/25/2019   Pyridium [phenazopyridine hcl] Swelling 06/02/2022    Past Medical History:  Diagnosis Date   ADHD    Arthritis 2010   Asthma    Back pain    Chronic headaches    Chronic pain    Constipation    Edema of both lower extremities    Family history of breast cancer    Family history of prostate cancer    Gallstones 2018   GERD (gastroesophageal reflux disease)    High cholesterol    History of kidney stones    Hx: UTI (urinary tract infection)    Hypertension    IBS (irritable bowel syndrome)    Interstitial cystitis 2012   Joint pain    Kidney problem    Migraine    Occipital neuralgia    Osteoarthritis    PNA (pneumonia) 2018   Pre-diabetes    Spinal stenosis    Trigeminal neuralgia     Past Surgical History:  Procedure Laterality Date   ABDOMINAL HYSTERECTOMY     ANKLE SURGERY Left 05/2009   APPENDECTOMY     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 Benard Rink at [facility] [place]   BRAIN SURGERY     CARPAL TUNNEL RELEASE Right 2014   CHOLECYSTECTOMY N/A 12/24/2020   Procedure: LAPAROSCOPIC CHOLECYSTECTOMY;  Surgeon: Abigail Miyamoto, MD;  Location: [facility] OR;  Service: General;  Laterality: N/A;   CYSTOSCOPY W/ URETERAL STENT PLACEMENT Left 07/25/2019   Procedure: CYSTOSCOPY WITH RETROGRADE PYELOGRAM/URETERAL STENT PLACEMENT;  Surgeon: Marcine Matar, MD;  Location: [facility];  Service: Urology;  Laterality: Left;   CYSTOSCOPY W/ URETERAL STENT REMOVAL Left 08/15/2019   Procedure: CYSTOSCOPY WITH STENT REMOVAL;  Surgeon: Marcine Matar, MD;  Location: [facility];  Service: Urology;  Laterality: Left;   CYSTOSCOPY WITH RETROGRADE PYELOGRAM, URETEROSCOPY AND STENT PLACEMENT Left 08/15/2019   Procedure: CYSTOSCOPY WITH RETROGRADE PYELOGRAM, URETEROSCOPY;  Surgeon: Marcine Matar, MD;  Location: [facility];  Service: Urology;  Laterality: Left;  90 MINS   CYSTOSCOPY WITH RETROGRADE  PYELOGRAM, URETEROSCOPY AND STENT PLACEMENT Right 09/12/2019   Procedure: CYSTOSCOPY WITH RIGHT RETROGRADE PYELOGRAM  URETEROSCOPY WITH HOLMIUM LASER STONE EXTRACTION AND STENT PLACEMENT;  Surgeon: Marcine Matar, MD;  Location: [facility];  Service: Urology;  Laterality: Right;   DILATION AND CURETTAGE OF UTERUS     HOLMIUM LASER APPLICATION Left 08/15/2019   Procedure: HOLMIUM LASER APPLICATION;  Surgeon: Marcine Matar, MD;  Location: [facility];  Service: Urology;  Laterality: Left;   JOINT REPLACEMENT     R knee   KIDNEY STONE SURGERY     neuro spine similator  01/2021   OVARIAN CYST REMOVAL  05/2010   right knee revision  2016   ROTATOR CUFF REPAIR Right 02/2019   SPINE SURGERY     x 3   TUBAL LIGATION      Family  History  Problem Relation Age of Onset   Ulcers Mother 6651       peptic ulcer   Irritable bowel syndrome Mother    High blood pressure Mother    Sudden death Mother    Depression Mother    Prostate cancer Father [id number]   Heart disease Father    Irritable bowel syndrome Father    High blood pressure Father    Breast cancer Sister [id number]       ER+/PR+/Her2- breast cancer   Alzheimer's disease Sister    Hypertension Sister    Kidney disease Sister    Celiac disease Sister    Thyroid disease Sister    Breast cancer Paternal Grandmother    Diabetes Paternal Grandmother    Cancer Paternal Uncle        unknown cancers   Liver disease Neg Hx    Esophageal cancer Neg Hx    Colon cancer Neg Hx     Social History   Socioeconomic History   Marital status: Single    Spouse name: Not on file   Number of children: 1   Years of education: Not on file   Highest education level: Not on file  Occupational History   Occupation: retired   Occupation: Consulting civil engineerstudent  Tobacco Use   Smoking status: Never   Smokeless tobacco: Never  Vaping Use   Vaping Use: Never used  Substance and Sexual Activity   Alcohol use: Yes    Comment: socially   Drug use: No   Sexual activity: Not  on file  Other Topics Concern   Not on file  Social History Narrative   Not on file   Social Determinants of Health   Financial Resource Strain: Not on file  Food Insecurity: Not on file  Transportation Needs: Not on file  Physical Activity: Not on file  Stress: Not on file  Social Connections: Not on file  Intimate Partner Violence: Not on file      Review of systems: Review of Systems  Gastrointestinal:  Positive for abdominal distention (bloating), abdominal pain, constipation and diarrhea. Negative for anal bleeding, blood in stool, nausea and vomiting.      Physical Exam: Vitals:   11/14/22 0936  BP: (!) 132/94  Pulse: 100   Body mass index is 31.32 kg/m.  General: well-appearing   Eyes: sclera anicteric, no redness GI: soft, no tenderness, with active bowel sounds. No guarding or palpable organomegaly noted. Epigastric tenderness, RLQ tenderness. Skin; warm and dry, no rash or jaundice noted Neuro: awake, alert and oriented x 3. Normal gross motor function and fluent speech   Data Reviewed:  Reviewed labs, radiology imaging, old records and pertinent past GI work up   Assessment and Plan/Recommendations:  62 year old very pleasant female with history of chronic GERD, s/p cholecystectomy for cholelithiasis irritable bowel syndrome with constipation and diarrhea   Abdominal discomfort is likely secondary to exacerbation of constipation with overflow diarrhea.  Advised patient to do MiraLAX bowel purge followed by MiraLAX half capful daily   Chronic intermittent right upper quadrant abdominal pain: Improving s/p cholecystectomy  Avoid high-fat diet  GERD: Continue Dexilant and antireflux measures   Abdominal cramping and discomfort: Use dicyclomine 10 mg every 8 hours as needed  History of adenomatous colon polyps, due for surveillance colonoscopy.  Will schedule it. The risks and benefits as well as alternatives of endoscopic procedure(s) have been  discussed and reviewed. All questions answered. The patient agrees to proceed.  Return after colonoscopy  If has persistent upper abdominal discomfort, will consider further workup with EGD or imaging if needed    The patient was provided an opportunity to ask questions and all were answered. The patient agreed with the plan and demonstrated an understanding of the instructions.  I,Safa M Kadhim,acting as a scribe for Marsa ArisKavitha Zaidyn Claire, MD.,have documented all relevant documentation on the behalf of Marsa ArisKavitha Neyland Pettengill, MD,as directed by  Marsa ArisKavitha Shirlee Whitmire, MD while in the presence of Marsa ArisKavitha Markies Mowatt, MD.   I, Marsa ArisKavitha Rhian Asebedo, MD, have reviewed all documentation for this visit. The documentation on 11/14/22 for the exam, diagnosis, procedures, and orders are all accurate and complete.   Iona BeardK. Veena Camber Ninh , MD    CC: Woodfin GanjaSamal, Subhankar, MD

## 2022-11-01 ENCOUNTER — Ambulatory Visit (INDEPENDENT_AMBULATORY_CARE_PROVIDER_SITE_OTHER): Payer: Medicare Other | Admitting: Physician Assistant

## 2022-11-02 ENCOUNTER — Ambulatory Visit: Payer: Medicare Other | Admitting: Podiatry

## 2022-11-03 ENCOUNTER — Ambulatory Visit (INDEPENDENT_AMBULATORY_CARE_PROVIDER_SITE_OTHER): Payer: Medicare Other | Admitting: Family Medicine

## 2022-11-04 ENCOUNTER — Ambulatory Visit (HOSPITAL_COMMUNITY)
Admission: RE | Admit: 2022-11-04 | Discharge: 2022-11-04 | Disposition: A | Discharge status: Home / Self Care | Payer: Medicare Other | Source: Ambulatory Visit | Attending: Orthopedic Surgery | Admitting: Orthopedic Surgery

## 2022-11-04 DIAGNOSIS — M5459 Other low back pain: Secondary | ICD-10-CM | POA: Insufficient documentation

## 2022-11-04 DIAGNOSIS — M545 Low back pain, unspecified: Secondary | ICD-10-CM | POA: Diagnosis not present

## 2022-11-04 MED ORDER — GADOBUTROL 1 MMOL/ML IV SOLN
8.0000 mL | Freq: Once | INTRAVENOUS | Status: AC | PRN
  Administered 2022-11-04: 8 mL via INTRAVENOUS

## 2022-11-09 ENCOUNTER — Encounter (INDEPENDENT_AMBULATORY_CARE_PROVIDER_SITE_OTHER): Payer: Self-pay | Admitting: Physician Assistant

## 2022-11-09 ENCOUNTER — Ambulatory Visit (INDEPENDENT_AMBULATORY_CARE_PROVIDER_SITE_OTHER): Payer: Medicare Other | Admitting: Physician Assistant

## 2022-11-09 VITALS — BP 148/102 | HR 91 | Temp 98.1°F | Ht 63.0 in | Wt 174.0 lb

## 2022-11-09 DIAGNOSIS — R7303 Prediabetes: Secondary | ICD-10-CM

## 2022-11-09 DIAGNOSIS — E669 Obesity, unspecified: Secondary | ICD-10-CM | POA: Diagnosis not present

## 2022-11-09 DIAGNOSIS — Z6832 Body mass index (BMI) 32.0-32.9, adult: Secondary | ICD-10-CM

## 2022-11-09 DIAGNOSIS — E785 Hyperlipidemia, unspecified: Secondary | ICD-10-CM | POA: Diagnosis not present

## 2022-11-09 DIAGNOSIS — Z6830 Body mass index (BMI) 30.0-30.9, adult: Secondary | ICD-10-CM | POA: Diagnosis not present

## 2022-11-09 DIAGNOSIS — E7849 Other hyperlipidemia: Secondary | ICD-10-CM | POA: Diagnosis not present

## 2022-11-09 DIAGNOSIS — I10 Essential (primary) hypertension: Secondary | ICD-10-CM | POA: Diagnosis not present

## 2022-11-09 NOTE — Assessment & Plan Note (Signed)
Prediabetes Last A1c was 6.3  Medication(s): None She is  working on nutrition plan to decrease simple carbohydrates, increase lean proteins and exercise to promote weight loss, improve glycemic control and prevent progression to Type 2 diabetes.   Lab Results  Component Value Date   HGBA1C 6.3 (H) 04/26/2022   HGBA1C 6.0 (H) 12/21/2020   Lab Results  Component Value Date   INSULIN 22.0 04/26/2022    Plan:  Continue working on nutrition plan to decrease simple carbohydrates, increase lean proteins and exercise to promote weight loss, improve glycemic control and prevent progression to Type 2 diabetes.  Recheck A1c, lnsulin level today.

## 2022-11-09 NOTE — Progress Notes (Signed)
Office: [phone]  /  Fax: [phone]  WEIGHT SUMMARY AND BIOMETRICS  Vitals Temp: 98.1 F (36.7 C) BP: (!) 148/102 Pulse Rate: 91 SpO2: 100 %   Anthropometric Measurements Height: 5\' 3"  (1.6 m) Weight: 174 lb (78.9 kg) BMI (Calculated): 30.83 Weight at Last Visit: 179 lb Weight Lost Since Last Visit: 5 lb Weight Gained Since Last Visit: 0 lb Starting Weight: 188 lb Total Weight Loss (lbs): 14 lb (6.35 kg)   Body Composition  Body Fat %: 40 % Fat Mass (lbs): 69.8 lbs Muscle Mass (lbs): 99.2 lbs Total Body Water (lbs): 69.4 lbs   Other Clinical Data Fasting: Yes Labs: Yes Today's Visit #: 6 Starting Date: 04/26/22     HPI  Chief Complaint: OBESITY  Cassidy Olsen is here to discuss her progress with her obesity treatment plan. She is on the the Category 1 Plan and states she is following her eating plan approximately 70 % of the time. She states she is exercising/walking 30 minutes 4-5 times per week.   Interval History:  Since last office visit she is down 5 lbs. Hunger/appetite well controlled Cravings at night for sweets, but able to use strategies to control well.  Some GI distress/stomach pains/intermittent diarrhea recently and seeing GI for work up and has follow up scheduled. S/P cholecystectomy 12/24/2020.  Colonoscopy February 2018, small adenomatous colon polyp removed and medium-sized hemorrhoids  EGD February 2018 showed hiatal hernia and gastritis.  Biopsies negative for H. pylori.  Duodenal biopsies negative for celiac. Hx GERD/IBS with constipation/diarrhea    Trip to [place] for Birthday in May!!  Has goals : 1) Start tracking consistently using [employer] again          2) Go to gym 2 x week for weight training   Pharmacotherapy: None for weight loss  PHYSICAL EXAM:  Blood pressure (!) 148/102, pulse 91, temperature 98.1 F (36.7 C), height 5\' 3"  (1.6 m), weight 174 lb (78.9 kg), SpO2 100 %. Body mass index is 30.82  kg/m.  General: She is overweight, cooperative, alert, well developed, and in no acute distress. PSYCH: Has normal mood, affect and thought process.   Cardiovascular: regular rhythm Lungs: Normal breathing effort, no conversational dyspnea. Neuro: no focal deficits  DIAGNOSTIC DATA REVIEWED:  BMET    Component Value Date/Time   NA 137 07/27/2022 1410   NA 139 04/26/2022 1050   K 3.5 07/27/2022 1410   CL 103 07/27/2022 1410   CO2 24 07/27/2022 1410   GLUCOSE 107 (H) 07/27/2022 1410   BUN 30 (H) 07/27/2022 1410   BUN 18 04/26/2022 1050   CREATININE 1.17 (H) 07/27/2022 1410   CALCIUM 9.3 07/27/2022 1410   GFRNONAA 53 (L) 07/27/2022 1410   GFRAA 59 (L) 12/09/2019 1616   Lab Results  Component Value Date   HGBA1C 6.3 (H) 04/26/2022   HGBA1C 6.0 (H) 12/21/2020   Lab Results  Component Value Date   INSULIN 22.0 04/26/2022   Lab Results  Component Value Date   TSH 4.022 07/27/2022   CBC    Component Value Date/Time   WBC 12.5 (H) 07/27/2022 1410   RBC 4.39 07/27/2022 1410   HGB 14.0 07/27/2022 1410   HGB 13.5 04/26/2022 1050   HCT 41.7 07/27/2022 1410   HCT 40.4 04/26/2022 1050   PLT 368 07/27/2022 1410   PLT 306 04/26/2022 1050   MCV 95.0 07/27/2022 1410   MCV 94 04/26/2022 1050   MCH 31.9 07/27/2022 1410   MCHC 33.6 07/27/2022 1410  RDW 13.5 07/27/2022 1410   RDW 14.3 04/26/2022 1050   Iron Studies No results found for: "IRON", "TIBC", "FERRITIN", "IRONPCTSAT" Lipid Panel     Component Value Date/Time   CHOL 180 04/26/2022 1050   TRIG 195 (H) 04/26/2022 1050   HDL 58 04/26/2022 1050   CHOLHDL 3.1 04/26/2022 1050   LDLCALC 89 04/26/2022 1050   Hepatic Function Panel     Component Value Date/Time   PROT 7.4 07/27/2022 1410   PROT 7.0 04/26/2022 1050   ALBUMIN 4.1 07/27/2022 1410   ALBUMIN 4.5 04/26/2022 1050   AST 21 07/27/2022 1410   ALT 28 07/27/2022 1410   ALKPHOS 61 07/27/2022 1410   BILITOT 0.8 07/27/2022 1410   BILITOT <0.2 04/26/2022  1050   BILIDIR <0.10 09/24/2021 1214      Component Value Date/Time   TSH 4.022 07/27/2022 1547   TSH 1.160 04/26/2022 1050   Nutritional No results found for: "VD25OH"  ASSOCIATED CONDITIONS ADDRESSED TODAY  ASSESSMENT AND PLAN  Problem List Items Addressed This Visit     Essential hypertension - Primary    Hypertension Hypertension no significant medication side effects noted, control uncertain, needs further observation, and patient reports BP well controlled on check at home and may have "white coat hypertension" when comes into clinic .  Medication(s): lisinopril 10 mg daily   Reports no hypotension symptoms currently.  BP Readings from Last 3 Encounters:  11/09/22 (!) 148/102  10/03/22 125/83  09/26/22 (!) 165/113   Lab Results  Component Value Date   CREATININE 1.17 (H) 07/27/2022   CREATININE 0.96 04/26/2022   CREATININE 1.00 12/21/2020  No results found for: "GFR"  Plan: Continue all antihypertensives at current dosages. Continue to work on nutrition plan to promote weight loss and improve BP control.  Monitor for hypotension as continues to loose weight.  Recheck labs today      Hyperlipidemia    Hyperlipidemia LDL is at goal. Medication(s): lipitor 20 mg daily Cardiovascular risk factors: dyslipidemia, hypertension, and obesity (BMI >= 30 kg/m2)  Lab Results  Component Value Date   CHOL 180 04/26/2022   HDL 58 04/26/2022   LDLCALC 89 04/26/2022   TRIG 195 (H) 04/26/2022   CHOLHDL 3.1 04/26/2022   CHOLHDL 3.3 09/24/2021   CHOLHDL 2.4 12/25/2019   Lab Results  Component Value Date   ALT 28 07/27/2022   AST 21 07/27/2022   ALKPHOS 61 07/27/2022   BILITOT 0.8 07/27/2022  The 10-year ASCVD risk score (Arnett DK, et al., 2019) is: 9.1%   Values used to calculate the score:     Age: 35 years     Sex: Female     Is Non-Hispanic African American: Yes     Diabetic: No     Tobacco smoker: No     Systolic Blood Pressure: 123456 mmHg     Is BP  treated: Yes     HDL Cholesterol: 58 mg/dL     Total Cholesterol: 180 mg/dL  Plan: Continue statin. Continue nutrition plan and exercise to promote weight loss and improve lipids and decrease cardiovascular risks.  Recheck fasting labs today        Relevant Orders   Lipid Panel With LDL/HDL Ratio   Prediabetes    Prediabetes Last A1c was 6.3  Medication(s): None She is  working on nutrition plan to decrease simple carbohydrates, increase lean proteins and exercise to promote weight loss, improve glycemic control and prevent progression to Type 2 diabetes.   Lab Results  Component Value Date   HGBA1C 6.3 (H) 04/26/2022   HGBA1C 6.0 (H) 12/21/2020   Lab Results  Component Value Date   INSULIN 22.0 04/26/2022   Plan:  Continue working on nutrition plan to decrease simple carbohydrates, increase lean proteins and exercise to promote weight loss, improve glycemic control and prevent progression to Type 2 diabetes.  Recheck A1c, lnsulin level today.         Relevant Orders   CMP14+EGFR   Hemoglobin A1c   Insulin, random   Obesity: Starting BMI 33.3   BMI 30.0-30.9,adult Current BMI 30.9    Has done well with weight loss. Down total of 14 lbs since 04/2022.          TREATMENT PLAN FOR OBESITY:  Recommended Dietary Goals  Cassia is currently in the action stage of change. As such, her goal is to continue weight management plan. She has agreed to the Category 1 Plan and keeping a food journal and adhering to recommended goals of 1100-1200 calories and 90+ grams of protein.  Behavioral Intervention  We discussed the following Behavioral Modification Strategies today: increasing lean protein intake, decreasing simple carbohydrates , avoiding skipping meals, increasing water intake, work on tracking and journaling calories using tracking application, and planning for success.  Additional resources provided today: NA  Recommended Physical Activity Goals  Teianna  has been advised to work up to 150 minutes of moderate intensity aerobic activity a week and strengthening exercises 2-3 times per week for cardiovascular health, weight loss maintenance and preservation of muscle mass.   She has agreed to Continue current level of physical activity  and Start strengthening exercises with a goal of 2-3 sessions a week   She was informed we would discuss her lab results at her next visit unless there is a critical issue that needs to be addressed sooner. She agreed to keep her next visit at the agreed upon time to discuss these results.    Pharmacotherapy We discussed various medication options to help [place] with her weight loss efforts and we both agreed to continue to work on nutrition plan and behavioral strategies to promote weight loss.    Return in about 6 weeks (around 12/21/2022).Marland Kitchen She was informed of the importance of frequent follow up visits to maximize her success with intensive lifestyle modifications for her multiple health conditions.   ATTESTASTION STATEMENTS:  Reviewed by clinician on day of visit: allergies, medications, problem list, medical history, surgical history, family history, social history, and previous encounter notes.   I have personally spent 42 minutes total time today in preparation, patient care, nutritional counseling and documentation for this visit, including the following: review of clinical lab tests; review of medical tests/procedures/services.      Butler Vegh, PA-C

## 2022-11-09 NOTE — Assessment & Plan Note (Signed)
Has done well with weight loss. Down total of 14 lbs since 04/2022.

## 2022-11-09 NOTE — Assessment & Plan Note (Addendum)
Hyperlipidemia LDL is at goal. Medication(s): lipitor 20 mg daily Cardiovascular risk factors: dyslipidemia, hypertension, and obesity (BMI >= 30 kg/m2)  Lab Results  Component Value Date   CHOL 180 04/26/2022   HDL 58 04/26/2022   LDLCALC 89 04/26/2022   TRIG 195 (H) 04/26/2022   CHOLHDL 3.1 04/26/2022   CHOLHDL 3.3 09/24/2021   CHOLHDL 2.4 12/25/2019   Lab Results  Component Value Date   ALT 28 07/27/2022   AST 21 07/27/2022   ALKPHOS 61 07/27/2022   BILITOT 0.8 07/27/2022   The 10-year ASCVD risk score (Arnett DK, et al., 2019) is: 9.1%   Values used to calculate the score:     Age: 62 years     Sex: Female     Is Non-Hispanic African American: Yes     Diabetic: No     Tobacco smoker: No     Systolic Blood Pressure: 123456 mmHg     Is BP treated: Yes     HDL Cholesterol: 58 mg/dL     Total Cholesterol: 180 mg/dL  Plan: Continue statin. Continue nutrition plan and exercise to promote weight loss and improve lipids and decrease cardiovascular risks.  Recheck fasting labs today

## 2022-11-09 NOTE — Assessment & Plan Note (Addendum)
Hypertension Hypertension no significant medication side effects noted, control uncertain, needs further observation, and patient reports BP well controlled on check at home and may have "white coat hypertension" when comes into clinic .  Medication(s): lisinopril 10 mg daily   Reports no hypotension symptoms currently.  BP Readings from Last 3 Encounters:  11/09/22 (!) 148/102  10/03/22 125/83  09/26/22 (!) 165/113   Lab Results  Component Value Date   CREATININE 1.17 (H) 07/27/2022   CREATININE 0.96 04/26/2022   CREATININE 1.00 12/21/2020   No results found for: "GFR"  Plan: Continue all antihypertensives at current dosages. Continue to work on nutrition plan to promote weight loss and improve BP control.  Monitor for hypotension as continues to loose weight.  Recheck labs today

## 2022-11-10 DIAGNOSIS — M5459 Other low back pain: Secondary | ICD-10-CM | POA: Diagnosis not present

## 2022-11-10 LAB — LIPID PANEL WITH LDL/HDL RATIO
Cholesterol, Total: 194 mg/dL (ref 100–199)
HDL: 64 mg/dL (ref >39)
LDL Chol Calc (NIH): 107 mg/dL — ABNORMAL HIGH (ref 0–99)
LDL/HDL Ratio: 1.7 ratio (ref 0.0–3.2)
Triglycerides: 130 mg/dL (ref 0–149)
VLDL Cholesterol Cal: 23 mg/dL (ref 5–40)

## 2022-11-10 LAB — HEMOGLOBIN A1C
Est. average glucose Bld gHb Est-mCnc: 123 mg/dL
Hgb A1c MFr Bld: 5.9 % — ABNORMAL HIGH (ref 4.8–5.6)

## 2022-11-10 LAB — CMP14+EGFR
ALT: 29 IU/L (ref 0–32)
AST: 23 IU/L (ref 0–40)
Albumin/Globulin Ratio: 1.7 (ref 1.2–2.2)
Albumin: 4.5 g/dL (ref 3.8–4.9)
Alkaline Phosphatase: 74 IU/L (ref 44–121)
BUN/Creatinine Ratio: 15 (ref 12–28)
BUN: 12 mg/dL (ref 8–27)
Bilirubin Total: 0.4 mg/dL (ref 0.0–1.2)
CO2: 21 mmol/L (ref 20–29)
Calcium: 9.7 mg/dL (ref 8.7–10.3)
Chloride: 101 mmol/L (ref 96–106)
Creatinine, Ser: 0.82 mg/dL (ref 0.57–1.00)
Globulin, Total: 2.6 g/dL (ref 1.5–4.5)
Glucose: 81 mg/dL (ref 70–99)
Potassium: 4.5 mmol/L (ref 3.5–5.2)
Sodium: 138 mmol/L (ref 134–144)
Total Protein: 7.1 g/dL (ref 6.0–8.5)
eGFR: 82 mL/min/{1.73_m2} (ref >59)

## 2022-11-10 LAB — INSULIN, RANDOM: INSULIN: 6.3 u[IU]/mL (ref 2.6–24.9)

## 2022-11-14 ENCOUNTER — Encounter: Payer: Self-pay | Admitting: Gastroenterology

## 2022-11-14 ENCOUNTER — Ambulatory Visit (INDEPENDENT_AMBULATORY_CARE_PROVIDER_SITE_OTHER): Payer: Medicare Other | Admitting: Gastroenterology

## 2022-11-14 VITALS — BP 130/90 | HR 100 | Ht 63.0 in | Wt 176.8 lb

## 2022-11-14 DIAGNOSIS — R1084 Generalized abdominal pain: Secondary | ICD-10-CM

## 2022-11-14 DIAGNOSIS — K582 Mixed irritable bowel syndrome: Secondary | ICD-10-CM

## 2022-11-14 DIAGNOSIS — Z86010 Personal history of colonic polyps: Secondary | ICD-10-CM | POA: Diagnosis not present

## 2022-11-14 MED ORDER — NA SULFATE-K SULFATE-MG SULF 17.5-3.13-1.6 GM/177ML PO SOLN: ORAL | 0 refills | Status: DC

## 2022-11-14 NOTE — Patient Instructions (Addendum)
You have been scheduled for a colonoscopy. Please follow written instructions given to you at your visit today.  Please pick up your prep supplies at the pharmacy within the next 1-3 days. If you use inhalers (even only as needed), please bring them with you on the day of your procedure.   Dr Lavon Paganini recommends that you complete a bowel purge (to clean out your bowels). Please do the following: Purchase a bottle of Miralax over the counter as well as a box of 5 mg dulcolax tablets. Take 4 dulcolax tablets. Wait 1 hour. You will then drink 6-8 capfuls of Miralax mixed in an adequate amount of water/juice/gatorade (you may choose which of these liquids to drink) over the next 2-3 hours. You should expect results within 1 to 6 hours after completing the bowel purge.   Follow Bowel Purge by taking Miralax 1/2 capful daily  Use Dicyclomine 10 mg every 8  hours as needed  Follow up in 3 months  Due to recent changes in healthcare laws, you may see the results of your imaging and laboratory studies on MyChart before your provider has had a chance to review them.  We understand that in some cases there may be results that are confusing or concerning to you. Not all laboratory results come back in the same time frame and the provider may be waiting for multiple results in order to interpret others.  Please give [place] 48 hours in order for your provider to thoroughly review all the results before contacting the office for clarification of your results.    I appreciate the  opportunity to care for you  Thank You   Marsa Aris , MD

## 2022-11-15 DIAGNOSIS — M961 Postlaminectomy syndrome, not elsewhere classified: Secondary | ICD-10-CM | POA: Diagnosis not present

## 2022-11-15 DIAGNOSIS — Z5181 Encounter for therapeutic drug level monitoring: Secondary | ICD-10-CM | POA: Diagnosis not present

## 2022-11-15 DIAGNOSIS — M5417 Radiculopathy, lumbosacral region: Secondary | ICD-10-CM | POA: Diagnosis not present

## 2022-11-15 DIAGNOSIS — Z79899 Other long term (current) drug therapy: Secondary | ICD-10-CM | POA: Diagnosis not present

## 2022-11-15 DIAGNOSIS — G894 Chronic pain syndrome: Secondary | ICD-10-CM | POA: Diagnosis not present

## 2022-11-21 DIAGNOSIS — M5417 Radiculopathy, lumbosacral region: Secondary | ICD-10-CM | POA: Diagnosis not present

## 2022-12-02 ENCOUNTER — Encounter: Payer: Self-pay | Admitting: Gastroenterology

## 2022-12-02 ENCOUNTER — Ambulatory Visit (AMBULATORY_SURGERY_CENTER): Payer: Medicare Other | Admitting: Gastroenterology

## 2022-12-02 VITALS — BP 128/89 | HR 89 | Temp 98.4°F | Resp 15 | Ht 63.0 in | Wt 176.0 lb

## 2022-12-02 DIAGNOSIS — Z09 Encounter for follow-up examination after completed treatment for conditions other than malignant neoplasm: Secondary | ICD-10-CM

## 2022-12-02 DIAGNOSIS — R7303 Prediabetes: Secondary | ICD-10-CM | POA: Diagnosis not present

## 2022-12-02 DIAGNOSIS — I10 Essential (primary) hypertension: Secondary | ICD-10-CM | POA: Diagnosis not present

## 2022-12-02 DIAGNOSIS — E7800 Pure hypercholesterolemia, unspecified: Secondary | ICD-10-CM | POA: Diagnosis not present

## 2022-12-02 DIAGNOSIS — Z86010 Personal history of colonic polyps: Secondary | ICD-10-CM

## 2022-12-02 MED ORDER — SODIUM CHLORIDE 0.9 % IV SOLN: 500.0000 mL | Freq: Once | INTRAVENOUS | Status: DC

## 2022-12-02 NOTE — Progress Notes (Signed)
[facility] History and Physical   Primary Care Physician:  Patient, No Pcp Per   Reason for Procedure:  History of adenomatous colon polyps  Plan:    Surveillance colonoscopy with possible interventions as needed     HPI: Cassidy Olsen is a very pleasant 62 y.o. female here for surveillance colonoscopy. Denies any nausea, vomiting, abdominal pain, melena or bright red blood per rectum  The risks and benefits as well as alternatives of endoscopic procedure(s) have been discussed and reviewed. All questions answered. The patient agrees to proceed.    Past Medical History:  Diagnosis Date   ADHD    Arthritis 2010   Asthma    Back pain    Chronic headaches    Chronic pain    Constipation    Edema of both lower extremities    Family history of breast cancer    Family history of prostate cancer    Gallstones 2018   GERD (gastroesophageal reflux disease)    High cholesterol    History of kidney stones    Hx: UTI (urinary tract infection)    Hypertension    IBS (irritable bowel syndrome)    Interstitial cystitis 2012   Joint pain    Kidney problem    Migraine    Occipital neuralgia    Osteoarthritis    PNA (pneumonia) 2018   Pre-diabetes    Spinal stenosis    Trigeminal neuralgia     Past Surgical History:  Procedure Laterality Date   ABDOMINAL HYSTERECTOMY     ANKLE SURGERY Left 05/2009   APPENDECTOMY     Benign Tumor Removal Right 01/26/2017   right upper arm by Dr. Benard Rink at [facility] [place]   BRAIN SURGERY     CARPAL TUNNEL RELEASE Right 2014   CHOLECYSTECTOMY N/A 12/24/2020   Procedure: LAPAROSCOPIC CHOLECYSTECTOMY;  Surgeon: Abigail Miyamoto, MD;  Location: [facility] OR;  Service: General;  Laterality: N/A;   CYSTOSCOPY W/ URETERAL STENT PLACEMENT Left 07/25/2019   Procedure: CYSTOSCOPY WITH RETROGRADE PYELOGRAM/URETERAL STENT PLACEMENT;  Surgeon: Marcine Matar, MD;  Location: [facility] ORS;  Service: Urology;  Laterality: Left;    CYSTOSCOPY W/ URETERAL STENT REMOVAL Left 08/15/2019   Procedure: CYSTOSCOPY WITH STENT REMOVAL;  Surgeon: Marcine Matar, MD;  Location: [facility];  Service: Urology;  Laterality: Left;   CYSTOSCOPY WITH RETROGRADE PYELOGRAM, URETEROSCOPY AND STENT PLACEMENT Left 08/15/2019   Procedure: CYSTOSCOPY WITH RETROGRADE PYELOGRAM, URETEROSCOPY;  Surgeon: Marcine Matar, MD;  Location: [facility];  Service: Urology;  Laterality: Left;  90 MINS   CYSTOSCOPY WITH RETROGRADE PYELOGRAM, URETEROSCOPY AND STENT PLACEMENT Right 09/12/2019   Procedure: CYSTOSCOPY WITH RIGHT RETROGRADE PYELOGRAM  URETEROSCOPY WITH HOLMIUM LASER STONE EXTRACTION AND STENT PLACEMENT;  Surgeon: Marcine Matar, MD;  Location: [facility];  Service: Urology;  Laterality: Right;   DILATION AND CURETTAGE OF UTERUS     HOLMIUM LASER APPLICATION Left 08/15/2019   Procedure: HOLMIUM LASER APPLICATION;  Surgeon: Marcine Matar, MD;  Location: [facility];  Service: Urology;  Laterality: Left;   JOINT REPLACEMENT     R knee   KIDNEY STONE SURGERY     neuro spine similator  01/2021   OVARIAN CYST REMOVAL  05/2010   right knee revision  2016   ROTATOR CUFF REPAIR Right 02/2019   SPINE SURGERY     x 3   TUBAL LIGATION      Prior to Admission medications   Medication Sig Start Date End Date Taking? Authorizing Provider  ACETAMINOPHEN PO Take  1,300 mg by mouth 2 (two) times daily.    [provider]  albuterol (VENTOLIN HFA) 108 (90 Base) MCG/ACT inhaler Inhale 2 puffs into the lungs every 6 (six) hours as needed for wheezing or shortness of breath. 06/02/22   Icard, Rachel Bo, DO  AMBULATORY NON FORMULARY MEDICATION 1 tablet 2 (two) times daily. Medication Name: Migrelief    [provider]  amitriptyline (ELAVIL) 50 MG tablet Take 1 tablet (50 mg total) by mouth at bedtime. 02/23/22   Lomax, Amy, NP  atorvastatin (LIPITOR) 20 MG tablet TAKE 1 TABLET BY MOUTH EVERY DAY 10/14/22   Runell Gess, MD  botulinum toxin Type  A (BOTOX) 200 units injection INJECT 155 UNITS INTRAMUSCULARLY EVERY 12 WEEKS (GIVEN AT MD  OFFICE, DISCARD UNUSED) 07/06/22   Lomax, Amy, NP  budesonide-formoterol (SYMBICORT) 80-4.5 MCG/ACT inhaler Inhale 2 puffs into the lungs 2 (two) times daily as needed (asthma). 09/15/21   Josephine Igo, DO  celecoxib (CELEBREX) 200 MG capsule Take 200 mg by mouth 2 (two) times daily.    [provider]  dexlansoprazole (DEXILANT) 60 MG capsule Take 1 capsule by mouth daily. 09/19/22   [provider]  dicyclomine (BENTYL) 10 MG capsule Take 1 by mouth twice daily. Patient taking differently: Take 20 mg by mouth in the morning and at bedtime. 08/31/22   Unk Lightning, PA  fluticasone (FLONASE) 50 MCG/ACT nasal spray Place 2 sprays into both nostrils daily. 06/08/20   Icard, Rachel Bo, DO  Fremanezumab-vfrm (AJOVY) 225 MG/1.5ML SOAJ Inject 225 mg into the skin every 30 (thirty) days. 05/03/22   Lomax, Amy, NP  gabapentin (NEURONTIN) 300 MG capsule Take 3 capsules (900 mg total) by mouth 3 (three) times daily. 05/24/22   Lomax, Amy, NP  lisinopril (ZESTRIL) 10 MG tablet Take 10 mg by mouth daily.    [provider]  lubiprostone (AMITIZA) 24 MCG capsule Take 1 capsule (24 mcg total) by mouth 2 (two) times daily with a meal. 08/31/22   Lemmon, Violet Baldy, PA  montelukast (SINGULAIR) 10 MG tablet TAKE 1 TABLET BY MOUTH EVERYDAY AT BEDTIME 09/19/22   Icard, Bradley L, DO  Na Sulfate-K Sulfate-Mg Sulf 17.5-3.13-1.6 GM/177ML SOLN As directed by GI office 11/14/22   Napoleon Form, MD  Omega-3 Fatty Acids (OMEGA III EPA+DHA) 1000 MG CAPS Take by mouth. 09/22/22   [provider]  Spacer/Aero-Holding Chambers (AEROCHAMBER MV) inhaler Use as instructed 04/18/18   Icard, Rachel Bo, DO  valACYclovir (VALTREX) 500 MG tablet Take 500 mg by mouth 2 (two) times daily as needed (outbreaks).     [provider]    Current Outpatient Medications  Medication Sig Dispense  Refill   ACETAMINOPHEN PO Take 1,300 mg by mouth 2 (two) times daily.     albuterol (VENTOLIN HFA) 108 (90 Base) MCG/ACT inhaler Inhale 2 puffs into the lungs every 6 (six) hours as needed for wheezing or shortness of breath. 8 g 6   AMBULATORY NON FORMULARY MEDICATION 1 tablet 2 (two) times daily. Medication Name: Migrelief     amitriptyline (ELAVIL) 50 MG tablet Take 1 tablet (50 mg total) by mouth at bedtime. 90 tablet 3   atorvastatin (LIPITOR) 20 MG tablet TAKE 1 TABLET BY MOUTH EVERY DAY 30 tablet 3   botulinum toxin Type A (BOTOX) 200 units injection INJECT 155 UNITS INTRAMUSCULARLY EVERY 12 WEEKS (GIVEN AT MD  OFFICE, DISCARD UNUSED) 1 each 1   budesonide-formoterol (SYMBICORT) 80-4.5 MCG/ACT inhaler Inhale 2  puffs into the lungs 2 (two) times daily as needed (asthma). 1 each 11   celecoxib (CELEBREX) 200 MG capsule Take 200 mg by mouth 2 (two) times daily.     dexlansoprazole (DEXILANT) 60 MG capsule Take 1 capsule by mouth daily.     dicyclomine (BENTYL) 10 MG capsule Take 1 by mouth twice daily. (Patient taking differently: Take 20 mg by mouth in the morning and at bedtime.) 60 capsule 11   fluticasone (FLONASE) 50 MCG/ACT nasal spray Place 2 sprays into both nostrils daily. 16 g 11   Fremanezumab-vfrm (AJOVY) 225 MG/1.5ML SOAJ Inject 225 mg into the skin every 30 (thirty) days. 4.5 mL 3   gabapentin (NEURONTIN) 300 MG capsule Take 3 capsules (900 mg total) by mouth 3 (three) times daily. 270 capsule 0   lisinopril (ZESTRIL) 10 MG tablet Take 10 mg by mouth daily.     lubiprostone (AMITIZA) 24 MCG capsule Take 1 capsule (24 mcg total) by mouth 2 (two) times daily with a meal. 180 capsule 3   montelukast (SINGULAIR) 10 MG tablet TAKE 1 TABLET BY MOUTH EVERYDAY AT BEDTIME 90 tablet 3   Na Sulfate-K Sulfate-Mg Sulf 17.5-3.13-1.6 GM/177ML SOLN As directed by GI office 354 mL 0   Omega-3 Fatty Acids (OMEGA III EPA+DHA) 1000 MG CAPS Take by mouth.     Spacer/Aero-Holding Chambers (AEROCHAMBER  MV) inhaler Use as instructed 1 each 0   valACYclovir (VALTREX) 500 MG tablet Take 500 mg by mouth 2 (two) times daily as needed (outbreaks).      Current Facility-Administered Medications  Medication Dose Route Frequency Provider Last Rate Last Admin   0.9 %  sodium chloride infusion  500 mL Intravenous Once Napoleon Form, MD        Allergies as of 12/02/2022 - Review Complete 12/02/2022  Allergen Reaction Noted   Aspirin Other (See Comments), Nausea And Vomiting, and Nausea Only 04/18/2013   Ketorolac Hives 08/31/2022   Ketorolac tromethamine Shortness Of Breath, Itching, and Other (See Comments) 08/14/2019   Penicillins Hives, Itching, Rash, and Other (See Comments) 03/19/1994   Codeine Other (See Comments) 07/17/2018   Ibuprofen Other (See Comments) and Nausea And Vomiting 09/03/2016   Latex Rash and Other (See Comments) 03/07/2011   Omeprazole magnesium Other (See Comments) 12/25/2019   Pantoprazole sodium Other (See Comments) 12/25/2019   Penicillamine Rash 12/26/2019   Pregabalin Other (See Comments) 12/25/2019   Pyridium [phenazopyridine hcl] Swelling 06/02/2022    Family History  Problem Relation Age of Onset   Ulcers Mother 26       peptic ulcer   Irritable bowel syndrome Mother    High blood pressure Mother    Sudden death Mother    Depression Mother    Prostate cancer Father 31   Heart disease Father    Irritable bowel syndrome Father    High blood pressure Father    Breast cancer Sister 64       ER+/PR+/Her2- breast cancer   Alzheimer's disease Sister    Hypertension Sister    Kidney disease Sister    Celiac disease Sister    Thyroid disease Sister    Breast cancer Paternal Grandmother    Diabetes Paternal Grandmother    Cancer Paternal Uncle        unknown cancers   Liver disease Neg Hx    Esophageal cancer Neg Hx    Colon cancer Neg Hx     Social History   Socioeconomic History   Marital status: Single  Spouse name: Not on file   Number  of children: 1   Years of education: Not on file   Highest education level: Not on file  Occupational History   Occupation: retired   Occupation: student  Tobacco Use   Smoking status: Never   Smokeless tobacco: Never  Vaping Use   Vaping Use: Never used  Substance and Sexual Activity   Alcohol use: Yes    Comment: socially   Drug use: No   Sexual activity: Not on file  Other Topics Concern   Not on file  Social History Narrative   Not on file   Social Determinants of Health   Financial Resource Strain: Not on file  Food Insecurity: Not on file  Transportation Needs: Not on file  Physical Activity: Not on file  Stress: Not on file  Social Connections: Not on file  Intimate Partner Violence: Not on file    Review of Systems:  All other review of systems negative except as mentioned in the HPI.  Physical Exam: Vital signs in last 24 hours: Blood Pressure (Abnormal) 153/97   Pulse 95   Temperature 98.4 F (36.9 C) (Temporal)   Height 5\' 3"  (1.6 m)   Weight 176 lb (79.8 kg)   Oxygen Saturation 99%   Body Mass Index 31.18 kg/m  General:   Alert, NAD Lungs:  Clear .   Heart:  Regular rate and rhythm Abdomen:  Soft, nontender and nondistended. Neuro/Psych:  Alert and cooperative. Normal mood and affect. A and O x 3  Reviewed labs, radiology imaging, old records and pertinent past GI work up  Patient is appropriate for planned procedure(s) and anesthesia in an ambulatory setting   K. Scherry Ran , MD [phone]

## 2022-12-02 NOTE — Op Note (Signed)
[facility] Patient Name: Cassidy Olsen Procedure Date: 12/02/2022 2:01 PM MRN: [id number] Endoscopist: Napoleon Form , MD, [id number] Age: 62 Referring MD:  Date of Birth: [birth year] Gender: Female Account #: [id number] Procedure:                Colonoscopy Indications:              High risk colon cancer surveillance: Personal                            history of colonic polyps, High risk colon cancer                            surveillance: Personal history of adenoma less than                            10 mm in size Medicines:                Monitored Anesthesia Care Procedure:                Pre-Anesthesia Assessment:                           - Prior to the procedure, a History and Physical                            was performed, and patient medications and                            allergies were reviewed. The patient's tolerance of                            previous anesthesia was also reviewed. The risks                            and benefits of the procedure and the sedation                            options and risks were discussed with the patient.                            All questions were answered, and informed consent                            was obtained. Prior Anticoagulants: The patient has                            taken no anticoagulant or antiplatelet agents. ASA                            Grade Assessment: II - A patient with mild systemic                            disease. After reviewing the risks and benefits,  the patient was deemed in satisfactory condition to                            undergo the procedure.                           After obtaining informed consent, the colonoscope                            was passed under direct vision. Throughout the                            procedure, the patient's blood pressure, pulse, and                            oxygen saturations were monitored  continuously. The                            Olympus PCF-H190DL (#[id number]) Colonoscope was                            introduced through the anus and advanced to the the                            cecum, identified by appendiceal orifice and                            ileocecal valve. The colonoscopy was performed                            without difficulty. The patient tolerated the                            procedure well. The quality of the bowel                            preparation was good. The ileocecal valve,                            appendiceal orifice, and rectum were photographed. Scope In: 2:30:50 PM Scope Out: 2:49:40 PM Scope Withdrawal Time: 0 hours 15 minutes 51 seconds  Total Procedure Duration: 0 hours 18 minutes 50 seconds  Findings:                 The perianal and digital rectal examinations were                            normal.                           Non-bleeding internal hemorrhoids were found during                            retroflexion. The hemorrhoids were small.  The exam was otherwise without abnormality. Complications:            No immediate complications. Estimated Blood Loss:     Estimated blood loss was minimal. Impression:               - Non-bleeding internal hemorrhoids.                           - The examination was otherwise normal.                           - No specimens collected. Recommendation:           - Patient has a contact number available for                            emergencies. The signs and symptoms of potential                            delayed complications were discussed with the                            patient. Return to normal activities tomorrow.                            Written discharge instructions were provided to the                            patient.                           - Resume previous diet.                           - Continue present medications.                            - Repeat colonoscopy in 10 years for surveillance. Napoleon Form, MD 12/02/2022 2:56:00 PM This report has been signed electronically.

## 2022-12-02 NOTE — Progress Notes (Signed)
A and O x3. Report to RN. Tolerated MAC anesthesia well. 

## 2022-12-02 NOTE — Patient Instructions (Addendum)
Recommendation:- Patient has a contact number available for                            emergencies. The signs and symptoms of potential                            delayed complications were discussed with the                            patient. Return to normal activities tomorrow.                            Written discharge instructions were provided to the                            patient.                           - Resume previous diet.                           - Continue present medications.                           - Repeat colonoscopy in 10 years for surveillance.  Handout on hemorrhoids given.  YOU HAD AN ENDOSCOPIC PROCEDURE TODAY AT THE [facility]:   Refer to the procedure report that was given to you for any specific questions about what was found during the examination.  If the procedure report does not answer your questions, please call your gastroenterologist to clarify.  If you requested that your care partner not be given the details of your procedure findings, then the procedure report has been included in a sealed envelope for you to review at your convenience later.  YOU SHOULD EXPECT: Some feelings of bloating in the abdomen. Passage of more gas than usual.  Walking can help get rid of the air that was put into your GI tract during the procedure and reduce the bloating. If you had a lower endoscopy (such as a colonoscopy or flexible sigmoidoscopy) you may notice spotting of blood in your stool or on the toilet paper. If you underwent a bowel prep for your procedure, you may not have a normal bowel movement for a few days.  Please Note:  You might notice some irritation and congestion in your nose or some drainage.  This is from the oxygen used during your procedure.  There is no need for concern and it should clear up in a day or so.  SYMPTOMS TO REPORT IMMEDIATELY:  Following lower endoscopy (colonoscopy or flexible sigmoidoscopy):  Excessive amounts of  blood in the stool  Significant tenderness or worsening of abdominal pains  Swelling of the abdomen that is new, acute  Fever of 100F or higher  For urgent or emergent issues, a gastroenterologist can be reached at any hour by calling [phone]. Do not use MyChart messaging for urgent concerns.    DIET:  We do recommend a small meal at first, but then you may proceed to your regular diet.  Drink plenty of fluids but you should avoid alcoholic beverages for 24 hours.  ACTIVITY:  You should  plan to take it easy for the rest of today and you should NOT DRIVE or use heavy machinery until tomorrow (because of the sedation medicines used during the test).    FOLLOW UP: Our staff will call the number listed on your records the next business day following your procedure.  We will call around 7:15- 8:00 am to check on you and address any questions or concerns that you may have regarding the information given to you following your procedure. If we do not reach you, we will leave a message.     If any biopsies were taken you will be contacted by phone or by letter within the next 1-3 weeks.  Please call us at [phone] if you have not heard about the biopsies in 3 weeks.    SIGNATURES/CONFIDENTIALITY: You and/or your care partner have signed paperwork which will be entered into your electronic medical record.  These signatures attest to the fact that that the information above on your After Visit Summary has been reviewed and is understood.  Full responsibility of the confidentiality of this discharge information lies with you and/or your care-partner.

## 2022-12-02 NOTE — Progress Notes (Signed)
VS completed by DT.  Pt's states no medical or surgical changes since previsit or office visit.  

## 2022-12-05 ENCOUNTER — Telehealth: Payer: Self-pay | Admitting: General Practice

## 2022-12-05 ENCOUNTER — Encounter: Payer: Self-pay | Admitting: Family Medicine

## 2022-12-05 ENCOUNTER — Telehealth: Payer: Self-pay

## 2022-12-05 ENCOUNTER — Ambulatory Visit (INDEPENDENT_AMBULATORY_CARE_PROVIDER_SITE_OTHER): Payer: Medicare Other | Admitting: Family Medicine

## 2022-12-05 VITALS — BP 134/88 | HR 88 | Temp 98.9°F | Ht 62.5 in | Wt 175.8 lb

## 2022-12-05 DIAGNOSIS — R200 Anesthesia of skin: Secondary | ICD-10-CM | POA: Diagnosis not present

## 2022-12-05 DIAGNOSIS — M4800 Spinal stenosis, site unspecified: Secondary | ICD-10-CM | POA: Diagnosis not present

## 2022-12-05 DIAGNOSIS — G43709 Chronic migraine without aura, not intractable, without status migrainosus: Secondary | ICD-10-CM

## 2022-12-05 DIAGNOSIS — M25521 Pain in right elbow: Secondary | ICD-10-CM | POA: Diagnosis not present

## 2022-12-05 DIAGNOSIS — I10 Essential (primary) hypertension: Secondary | ICD-10-CM | POA: Diagnosis not present

## 2022-12-05 DIAGNOSIS — K219 Gastro-esophageal reflux disease without esophagitis: Secondary | ICD-10-CM | POA: Diagnosis not present

## 2022-12-05 DIAGNOSIS — G500 Trigeminal neuralgia: Secondary | ICD-10-CM

## 2022-12-05 DIAGNOSIS — R7303 Prediabetes: Secondary | ICD-10-CM

## 2022-12-05 DIAGNOSIS — G5601 Carpal tunnel syndrome, right upper limb: Secondary | ICD-10-CM | POA: Diagnosis not present

## 2022-12-05 DIAGNOSIS — K581 Irritable bowel syndrome with constipation: Secondary | ICD-10-CM | POA: Diagnosis not present

## 2022-12-05 DIAGNOSIS — Z7689 Persons encountering health services in other specified circumstances: Secondary | ICD-10-CM

## 2022-12-05 DIAGNOSIS — M79601 Pain in right arm: Secondary | ICD-10-CM | POA: Diagnosis not present

## 2022-12-05 NOTE — Progress Notes (Signed)
Established Patient Office Visit   Subjective  Patient ID: Cassidy Olsen, female    DOB: [birth year]  Age: 62 y.o. MRN: [id number]  Chief Complaint  Patient presents with   Establish Care    Pt is a 62 yo female seen for est care and f/u on ongoing concerns.  Pt previously seen by Payton Emerald, MD at [facility].  HTN:  bp 140s/80s.  On lisinopril 10 mg.    Pre DM:  A1C 5.9% on 11/09/22  IBS-C:  Seen by GI, Dr. Lavon Paganini  on dicyclomine 10 mg BID prn and Miralax.  GERD:  several PPIs in the past were not effective.  Wheat causes symptoms.  Trigeminal neuralgia:  seen by Neurology, Dr. Lucia Gaskins.    Migraines: seen by [facility].  Tried botox, migraine relief, and Ajovy. Has a mild HA 6-10 days.  Pain in frontal area and across top of head.  DDD/spinal stenosis: seen by Dr. Shon Baton, neurosurgery.  Also seen by pain management.    Obesity:  followed by [facility].    Past Medical History:  Diagnosis Date   ADHD    Arthritis 2010   Asthma    Back pain    Chronic headaches    Chronic pain    Constipation    Edema of both lower extremities    Family history of breast cancer    Family history of prostate cancer    Gallstones 2018   GERD (gastroesophageal reflux disease)    High cholesterol    History of kidney stones    Hx: UTI (urinary tract infection)    Hypertension    IBS (irritable bowel syndrome)    Interstitial cystitis 2012   Joint pain    Kidney problem    Migraine    Occipital neuralgia    Osteoarthritis    PNA (pneumonia) 2018   Pre-diabetes    Spinal stenosis    Trigeminal neuralgia    Past Surgical History:  Procedure Laterality Date   ABDOMINAL HYSTERECTOMY     ANKLE SURGERY Left 05/2009   APPENDECTOMY     Benign Tumor Removal Right 01/26/2017   right upper arm by Dr. Benard Rink at [facility] [place]   BRAIN SURGERY     CARPAL TUNNEL RELEASE Right 2014   CHOLECYSTECTOMY N/A 12/24/2020   Procedure: LAPAROSCOPIC  CHOLECYSTECTOMY;  Surgeon: Abigail Miyamoto, MD;  Location: [facility] OR;  Service: General;  Laterality: N/A;   COLONOSCOPY     CYSTOSCOPY W/ URETERAL STENT PLACEMENT Left 07/25/2019   Procedure: CYSTOSCOPY WITH RETROGRADE PYELOGRAM/URETERAL STENT PLACEMENT;  Surgeon: Marcine Matar, MD;  Location: [facility] ORS;  Service: Urology;  Laterality: Left;   CYSTOSCOPY W/ URETERAL STENT REMOVAL Left 08/15/2019   Procedure: CYSTOSCOPY WITH STENT REMOVAL;  Surgeon: Marcine Matar, MD;  Location: [facility];  Service: Urology;  Laterality: Left;   CYSTOSCOPY WITH RETROGRADE PYELOGRAM, URETEROSCOPY AND STENT PLACEMENT Left 08/15/2019   Procedure: CYSTOSCOPY WITH RETROGRADE PYELOGRAM, URETEROSCOPY;  Surgeon: Marcine Matar, MD;  Location: [facility];  Service: Urology;  Laterality: Left;  90 MINS   CYSTOSCOPY WITH RETROGRADE PYELOGRAM, URETEROSCOPY AND STENT PLACEMENT Right 09/12/2019   Procedure: CYSTOSCOPY WITH RIGHT RETROGRADE PYELOGRAM  URETEROSCOPY WITH HOLMIUM LASER STONE EXTRACTION AND STENT PLACEMENT;  Surgeon: Marcine Matar, MD;  Location: [facility];  Service: Urology;  Laterality: Right;   DILATION AND CURETTAGE OF UTERUS     HOLMIUM LASER APPLICATION Left 08/15/2019   Procedure: HOLMIUM LASER APPLICATION;  Surgeon: Marcine Matar, MD;  Location: [facility];  Service: Urology;  Laterality: Left;   JOINT REPLACEMENT     R knee   KIDNEY STONE SURGERY     neuro spine similator  01/2021   OVARIAN CYST REMOVAL  05/2010   right knee revision  2016   ROTATOR CUFF REPAIR Right 02/2019   SPINE SURGERY     x 3   TUBAL LIGATION     Social History   Tobacco Use   Smoking status: Never   Smokeless tobacco: Never  Vaping Use   Vaping Use: Never used  Substance Use Topics   Alcohol use: Yes    Comment: socially   Drug use: No   Family History  Problem Relation Age of Onset   Ulcers Mother 62       peptic ulcer   Irritable bowel syndrome Mother    High blood pressure Mother    Sudden death Mother     Depression Mother    Prostate cancer Father 48   Heart disease Father    Irritable bowel syndrome Father    High blood pressure Father    Breast cancer Sister 3       ER+/PR+/Her2- breast cancer   Alzheimer's disease Sister    Hypertension Sister    Kidney disease Sister    Celiac disease Sister    Thyroid disease Sister    Breast cancer Paternal Grandmother    Diabetes Paternal Grandmother    Cancer Paternal Uncle        unknown cancers   Liver disease Neg Hx    Esophageal cancer Neg Hx    Colon cancer Neg Hx    Allergies  Allergen Reactions   Aspirin Other (See Comments), Nausea And Vomiting and Nausea Only    Stomach upset Avoids due to IBS Due to ibs Stomach upset Due to ibs Avoids due to IBS   Ketorolac Hives   Ketorolac Tromethamine Shortness Of Breath, Itching and Other (See Comments)    IM administration ONLY  REDNESS IM administration ONLY  REDNESS  SWELLING OF THE THROAT   ORAL TORADOL CAUSED EXTREME REDNESS, ITCHINESS, DIFFICULTY BREATHING, AND FACIAL SWELLING. PATIENT ALSO DIAGNOSED WITH BELL'S PALSY.   Penicillins Hives, Itching, Rash and Other (See Comments)    Did it involve swelling of the face/tongue/throat, SOB, or low BP? No Did it involve sudden or severe rash/hives, skin peeling, or any reaction on the inside of your mouth or nose? Yes Did you need to seek medical attention at a hospital or doctor's office? No When did it last happen?      2000 If all above answers are "NO", may proceed with cephalosporin use.  Vaginal rash Vaginal rash Did it involve swelling of the face/tongue/throat, SOB, or low BP? No Did it involve sudden or severe rash/hives, skin peeling, or any reaction on the inside of your mouth or nose? Yes Did you need to seek medical attention at a hospital or doctor's office? No When did it last happen?      2000 If all above answers are "NO", may proceed with cephalosporin use. Vaginal rash Vaginal rash   Codeine Other  (See Comments)    Cough meds with codeine-have withdraw symptoms when done Cough meds with codeine-have withdraw symptoms when done Cough meds with codeine-have withdraw symptoms when done   Ibuprofen Other (See Comments) and Nausea And Vomiting    Stomach upset Avoids due to IBS Stomach upset Avoids due to IBS   Latex Rash and Other (  See Comments)    Vaginal irritation Vaginal irritation Exacerbates genital herpes Exacerbates genital herpes   Omeprazole Magnesium Other (See Comments)    Lack of therapeutic effect Lack of therapeutic effect   Pantoprazole Sodium Other (See Comments)    Lack of therapeutic effect Lack of therapeutic effect   Penicillamine Rash   Pregabalin Other (See Comments)    Weight gain Weight gain   Pyridium [Phenazopyridine Hcl] Swelling    Lip swelling and burning      ROS Negative unless stated above    Objective:     BP 134/88 (BP Location: Left Arm, Patient Position: Sitting, Cuff Size: Large)   Pulse 88   Temp 98.9 F (37.2 C) (Oral)   Ht 5' 2.5" (1.588 m)   Wt 175 lb 12.8 oz (79.7 kg)   SpO2 95%   BMI 31.64 kg/m    Physical Exam Constitutional:      General: She is not in acute distress.    Appearance: Normal appearance.  HENT:     Head: Normocephalic and atraumatic.     Nose: Nose normal.     Mouth/Throat:     Mouth: Mucous membranes are moist.  Cardiovascular:     Rate and Rhythm: Normal rate and regular rhythm.     Heart sounds: Normal heart sounds. No murmur heard.    No gallop.  Pulmonary:     Effort: Pulmonary effort is normal. No respiratory distress.     Breath sounds: Normal breath sounds. No wheezing, rhonchi or rales.  Skin:    General: Skin is warm and dry.  Neurological:     Mental Status: She is alert and oriented to person, place, and time.      No results found for any visits on 12/05/22.    Assessment & Plan:  Essential hypertension -controlled -continue lisinopril -lifestyle  modifications  Encounter to establish care -We reviewed the PMH, PSH, FH, SH, Meds and Allergies. -We provided refills for any medications we will prescribe as needed. -We addressed current concerns per orders and patient instructions. -We have asked for records for pertinent exams, studies, vaccines and notes from previous providers. -We have advised patient to follow up per instructions below.  Chronic migraine without aura without status migrainosus, not intractable -continue current meds including Botox, Ajovy, and migraine relief -continue f/u with Neuro  Spinal stenosis, unspecified spinal region -continue f/u with neurosurgery, Dr. Shon Baton  Gastroesophageal reflux disease, unspecified whether esophagitis present -avoid foods known to cause symptoms -f/u with GI.  Prediabetes -hgb A1C 5.9% on 11/09/22 -lifestyle modifications  Irritable bowel syndrome with constipation -f/u with GI  Trigeminal neuralgia of right side of face -continue f/u with neuro   Return if symptoms worsen or fail to improve.   Deeann Saint, MD

## 2022-12-05 NOTE — Telephone Encounter (Signed)
Follow up call to pt, lm for pt to call if having any difficulty with normal activities or eating and drinking.  Also to call if any other questions or concerns.  

## 2022-12-05 NOTE — Telephone Encounter (Signed)
Pt would like to know what dosage of magnesium and coq 10 she would need to take. Requesting a call back: [phone].

## 2022-12-06 ENCOUNTER — Other Ambulatory Visit (HOSPITAL_COMMUNITY): Payer: Self-pay | Admitting: Orthopedic Surgery

## 2022-12-06 DIAGNOSIS — M79601 Pain in right arm: Secondary | ICD-10-CM

## 2022-12-06 DIAGNOSIS — M25521 Pain in right elbow: Secondary | ICD-10-CM

## 2022-12-08 ENCOUNTER — Other Ambulatory Visit (HOSPITAL_COMMUNITY): Payer: BC Managed Care – PPO

## 2022-12-08 ENCOUNTER — Telehealth: Payer: Self-pay | Admitting: Cardiovascular Disease

## 2022-12-08 ENCOUNTER — Ambulatory Visit (HOSPITAL_COMMUNITY): Payer: BC Managed Care – PPO

## 2022-12-08 NOTE — Telephone Encounter (Signed)
Patient wants to know if she could have a virtual appt with Dr. Allyson Sabal

## 2022-12-08 NOTE — Telephone Encounter (Signed)
Left voicemail for patient to return call to office. 

## 2022-12-12 DIAGNOSIS — G894 Chronic pain syndrome: Secondary | ICD-10-CM | POA: Diagnosis not present

## 2022-12-12 DIAGNOSIS — M5417 Radiculopathy, lumbosacral region: Secondary | ICD-10-CM | POA: Diagnosis not present

## 2022-12-13 ENCOUNTER — Ambulatory Visit: Payer: BC Managed Care – PPO | Admitting: Cardiovascular Disease

## 2022-12-13 NOTE — Telephone Encounter (Signed)
Patient is very rude on the phone stating she cancelled her appt for today and she is not paying for her visit that was missed.  I apologized that I didn't see the cancellation.  She gets loud and tells me her conversation with a previous person.  Advised that I just see we LVM for her to return call to [place].  I advised I could make her another appt.  Gave date and she immediatly gets loud and goes "NO NO NO" She states "I told the other person that I would be out of town and not here until after the 23rd  I advised that I am not seeing that message but that I am trying to help set her an appt and trying to help her but I cannot attest to her last conversations.  At this time offered tanother appt.s and she states unable due to "procedure"   Advised I would reach out to scheduling to call her to schedule an appt.

## 2022-12-14 NOTE — Telephone Encounter (Signed)
Magnesium: Magnesium (250 mg twice a day or 500 mg at bed)  Good sources include nuts, whole grains, and tomatoes. Side Effects: loose stool/diarrhea   Coenzyme Q10:  Doses of 150 mg twice a day have been shown to be effective for migraine prevention.

## 2022-12-15 ENCOUNTER — Other Ambulatory Visit (HOSPITAL_COMMUNITY): Payer: Self-pay

## 2022-12-15 NOTE — Telephone Encounter (Signed)
Spoke to pt and inform her of message from Dr. Salomon Fick. Verbalized understanding.

## 2022-12-15 NOTE — Telephone Encounter (Signed)
Spoke to pt. Inform her of recommendation from Dr. Salomon Fick.   She reports she is taking migrelief twice a day recommended by her neurologist for headache. It has 300mg  of magnesium in it. Pt states she doesn't think it is wise for her to take 250mg  on top of that. She states can't do nuts nor tomatoes due to ibs, acid reflux. But can who whole grains.    Please advise.

## 2022-12-15 NOTE — Telephone Encounter (Signed)
If already taking magnesium does not need to add extra.  Would advise pt to follow the recommendations of Neurology.

## 2022-12-18 DIAGNOSIS — K219 Gastro-esophageal reflux disease without esophagitis: Secondary | ICD-10-CM | POA: Insufficient documentation

## 2022-12-18 DIAGNOSIS — M4800 Spinal stenosis, site unspecified: Secondary | ICD-10-CM | POA: Insufficient documentation

## 2022-12-20 ENCOUNTER — Telehealth: Payer: Self-pay | Admitting: Family Medicine

## 2022-12-20 NOTE — Telephone Encounter (Signed)
Called patient to schedule Medicare Annual Wellness Visit (AWV). Left message for patient to call back and schedule Medicare Annual Wellness Visit (AWV).  Last date of AWV: 10/28/19  Please schedule an appointment at any time with [facility] or [employer].  If any questions, please contact me at [facility] AWV direct phone # [phone].  Thank you ,  Rudell Cobb AWV direct phone # [phone]

## 2022-12-22 ENCOUNTER — Other Ambulatory Visit: Payer: Self-pay | Admitting: Family Medicine

## 2022-12-22 ENCOUNTER — Other Ambulatory Visit (HOSPITAL_COMMUNITY): Payer: Self-pay

## 2022-12-22 DIAGNOSIS — G43709 Chronic migraine without aura, not intractable, without status migrainosus: Secondary | ICD-10-CM

## 2022-12-23 NOTE — Telephone Encounter (Signed)
LVMTCB to reschedule her appt.

## 2022-12-26 ENCOUNTER — Other Ambulatory Visit: Payer: Self-pay | Admitting: Family Medicine

## 2022-12-26 ENCOUNTER — Other Ambulatory Visit (HOSPITAL_COMMUNITY): Payer: Self-pay

## 2022-12-26 DIAGNOSIS — G43709 Chronic migraine without aura, not intractable, without status migrainosus: Secondary | ICD-10-CM

## 2022-12-27 ENCOUNTER — Other Ambulatory Visit (HOSPITAL_COMMUNITY): Payer: Self-pay

## 2022-12-27 ENCOUNTER — Other Ambulatory Visit: Payer: Self-pay

## 2022-12-27 MED ORDER — BOTOX 200 UNITS IJ SOLR
INTRAMUSCULAR | 1 refills | Status: DC
  Filled 2022-12-27: qty 1, 84d supply, fill #0
  Filled 2023-03-27: qty 1, 84d supply, fill #1

## 2022-12-27 NOTE — Telephone Encounter (Signed)
Rx sent via refill request. 

## 2022-12-27 NOTE — Telephone Encounter (Signed)
Pt last seen on 10/03/22 for video visit..   Botox injection 01/03/23

## 2022-12-27 NOTE — Telephone Encounter (Signed)
Please send a Botox refill to [facility] pharmacy, thank you!

## 2022-12-29 ENCOUNTER — Other Ambulatory Visit (HOSPITAL_COMMUNITY): Payer: Self-pay

## 2022-12-29 ENCOUNTER — Ambulatory Visit (INDEPENDENT_AMBULATORY_CARE_PROVIDER_SITE_OTHER): Payer: Medicare Other | Admitting: Family Medicine
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
## 2022-12-29 NOTE — Progress Notes (Incomplete)
Carlye Grippe, D.O.  ABFM, ABOM Specializing in Clinical Bariatric Medicine  Office located at: [address] [place], [place]  [address]     Assessment and Plan:   No orders of the defined types were placed in this encounter.   There are no discontinued medications.   No orders of the defined types were placed in this encounter.    *** There are no diagnoses linked to this encounter.     TREATMENT PLAN FOR OBESITY:   Assessment:  Cassidy Olsen is here to discuss her progress with her obesity treatment plan along with follow-up of her obesity related diagnoses. See Medical Weight Management Flowsheet for complete bioelectrical impedance results.  Condition is {docourse:29403:::1}. {BiometricData (Optional):29179}  Since last office visit on *** patient's Fat mass has {DID:29233} by ***lb. Muscle mass has {DID:29233} by ***lb. Total body water has {DID:29233} by ***lb.  Counseling done on how various foods will affect these numbers and how to maximize success  Total lbs lost to date: *** Total weight loss percentage to date: *** ( use https://www.[employer]al[employer].si )  Plan:  Cassidy Olsen is currently in the action stage of change. As such, her goal is to continue her weight management plan. Cassidy Olsen will work on healthier eating habits and try to follow the {mealplan:29239} best they can.   Behavioral Intervention Additional resources provided today: {weightresources:29185} Evidence-based interventions for health behavior change were utilized today including the discussion of self monitoring techniques, problem-solving barriers and SMART goal setting techniques.   Regarding patient's less desirable eating habits and patterns, we employed the technique of small changes.  Pt will specifically work on: *** for next visit.    Recommended Physical Activity Goals  Cassidy Olsen has been advised to slowly work up to 150 minutes of moderate  intensity aerobic activity a week and strengthening exercises 2-3 times per week for cardiovascular health, weight loss maintenance and preservation of muscle mass.   She has agreed to {EMEXERCISE:28847::"Think about ways to increase daily physical activity and overcoming barriers to exercise"}  Pharmacotherapy Current Anti-obesity medications: ***. Reported side effects: ***. We discussed various medication options to help Cassidy Olsen with her weight loss efforts and we both agreed to ***.( or Patient prefers to not start any weight loss medications at this time.   FOLLOW UP: No follow-ups on file.*** She was informed of the importance of frequent follow up visits to maximize her success with intensive lifestyle modifications for her multiple health conditions.  Subjective:   Chief complaint: Obesity Cassidy Olsen is here to discuss her progress with her obesity treatment plan. She is on the {MWMwtlossportion/plan2:23431} and states she is following her eating plan approximately ***% of the time. She states she is exercising *** minutes *** days per week.  Interval History:  Cassidy Olsen is here for a follow up office visit.     Since last office visit:  ***  We reviewed her meal plan and all questions were answered. Patient's food recall appears to be accurate and consistent with what is on plan when she is following it. When eating on plan, her hunger and cravings are well controlled.      Pharmacotherapy for weight loss: She {srtis (Optional):29129} currently taking {srtpreviousweightlossmeds (Optional):29124} for medical weight loss.  Denies side effects.    Review of Systems:  Pertinent positives were addressed with patient today.  Weight Summary and Biometrics   No data recorded No data recorded ***  No data recorded No data recorded No data recorded No  data recorded   Objective:   PHYSICAL EXAM: There were no vitals taken for this visit. There is no height or weight on  file to calculate BMI.  General: Well Developed, well nourished, and in no acute distress.  HEENT: Normocephalic, atraumatic Skin: Warm and dry, cap RF less 2 sec, good turgor Chest:  Normal excursion, shape, no gross abn Respiratory: speaking in full sentences, no conversational dyspnea NeuroM-Sk: Ambulates w/o assistance, moves * 4 Psych: A and O *3, insight good, mood-full  DIAGNOSTIC DATA REVIEWED:  BMET    Component Value Date/Time   NA 138 11/09/2022 1222   K 4.5 11/09/2022 1222   CL 101 11/09/2022 1222   CO2 21 11/09/2022 1222   GLUCOSE 81 11/09/2022 1222   GLUCOSE 107 (H) 07/27/2022 1410   BUN 12 11/09/2022 1222   CREATININE 0.82 11/09/2022 1222   CALCIUM 9.7 11/09/2022 1222   GFRNONAA 53 (L) 07/27/2022 1410   GFRAA 59 (L) 12/09/2019 1616   Lab Results  Component Value Date   HGBA1C 5.9 (H) 11/09/2022   HGBA1C 6.0 (H) 12/21/2020   Lab Results  Component Value Date   INSULIN 6.3 11/09/2022   INSULIN 22.0 04/26/2022   Lab Results  Component Value Date   TSH 4.022 07/27/2022   CBC    Component Value Date/Time   WBC 12.5 (H) 07/27/2022 1410   RBC 4.39 07/27/2022 1410   HGB 14.0 07/27/2022 1410   HGB 13.5 04/26/2022 1050   HCT 41.7 07/27/2022 1410   HCT 40.4 04/26/2022 1050   PLT 368 07/27/2022 1410   PLT 306 04/26/2022 1050   MCV 95.0 07/27/2022 1410   MCV 94 04/26/2022 1050   MCH 31.9 07/27/2022 1410   MCHC 33.6 07/27/2022 1410   RDW 13.5 07/27/2022 1410   RDW 14.3 04/26/2022 1050   Iron Studies No results found for: "IRON", "TIBC", "FERRITIN", "IRONPCTSAT" Lipid Panel     Component Value Date/Time   CHOL 194 11/09/2022 1222   TRIG 130 11/09/2022 1222   HDL 64 11/09/2022 1222   CHOLHDL 3.1 04/26/2022 1050   LDLCALC 107 (H) 11/09/2022 1222   Hepatic Function Panel     Component Value Date/Time   PROT 7.1 11/09/2022 1222   ALBUMIN 4.5 11/09/2022 1222   AST 23 11/09/2022 1222   ALT 29 11/09/2022 1222   ALKPHOS 74 11/09/2022 1222    BILITOT 0.4 11/09/2022 1222   BILIDIR <0.10 09/24/2021 1214      Component Value Date/Time   TSH 4.022 07/27/2022 1547   TSH 1.160 04/26/2022 1050   Nutritional No results found for: "VD25OH"  Attestations:   Reviewed by clinician on day of visit: allergies, medications, problem list, medical history, surgical history, family history, social history, and previous encounter notes.   Patient was in the office today and time spent on visit including pre-visit chart review and post-visit care/coordination of care and electronic medical record documentation was *** minutes. 50% of the time was in face to face counseling of this patient's medical condition(s) and providing education on treatment options to include the first-line treatment of diet and lifestyle modification.   I,Cassidy Olsen,acting as a Neurosurgeon for [employer], DO.,have documented all relevant documentation on the behalf of Cassidy Lot, DO,as directed by  Cassidy Lot, DO while in the presence of Cassidy Lot, DO.   I, Cassidy Lot, DO, have reviewed all documentation for this visit. The documentation on 12/29/22 for the exam, diagnosis, procedures, and orders are all accurate and complete.

## 2023-01-03 ENCOUNTER — Ambulatory Visit (HOSPITAL_BASED_OUTPATIENT_CLINIC_OR_DEPARTMENT_OTHER): Payer: Medicare Other | Attending: Orthopedic Surgery | Admitting: Physical Therapy

## 2023-01-03 ENCOUNTER — Ambulatory Visit (INDEPENDENT_AMBULATORY_CARE_PROVIDER_SITE_OTHER): Payer: Medicare Other | Admitting: Family Medicine

## 2023-01-03 ENCOUNTER — Other Ambulatory Visit: Payer: Self-pay

## 2023-01-03 ENCOUNTER — Encounter (HOSPITAL_BASED_OUTPATIENT_CLINIC_OR_DEPARTMENT_OTHER): Payer: Self-pay | Admitting: Physical Therapy

## 2023-01-03 DIAGNOSIS — M6281 Muscle weakness (generalized): Secondary | ICD-10-CM | POA: Insufficient documentation

## 2023-01-03 DIAGNOSIS — G43709 Chronic migraine without aura, not intractable, without status migrainosus: Secondary | ICD-10-CM

## 2023-01-03 DIAGNOSIS — R262 Difficulty in walking, not elsewhere classified: Secondary | ICD-10-CM | POA: Diagnosis not present

## 2023-01-03 DIAGNOSIS — M545 Low back pain, unspecified: Secondary | ICD-10-CM | POA: Insufficient documentation

## 2023-01-03 MED ORDER — ONABOTULINUMTOXINA 200 UNITS IJ SOLR
155.0000 [IU] | Freq: Once | INTRAMUSCULAR | Status: AC
  Administered 2023-01-03: 155 [IU] via INTRAMUSCULAR

## 2023-01-03 NOTE — Therapy (Addendum)
OUTPATIENT PHYSICAL THERAPY THORACOLUMBAR EVALUATION   Patient Name: Cassidy Olsen MRN: [id number] DOB:24-Jun-1961, 62 y.o., female Today's Date: 01/03/2023  END OF SESSION:  PT End of Session - 01/03/23 1456     Visit Number 1    Number of Visits 16    Date for PT Re-Evaluation 04/03/23    Authorization Type MCR    PT Start Time 1445    PT Stop Time 1530    PT Time Calculation (min) 45 min    Activity Tolerance Patient tolerated treatment well;Patient limited by pain    Behavior During Therapy [facility] for tasks assessed/performed             Past Medical History:  Diagnosis Date   ADHD    Arthritis 2010   Asthma    Back pain    Chronic headaches    Chronic pain    Constipation    Edema of both lower extremities    Family history of breast cancer    Family history of prostate cancer    Gallstones 2018   GERD (gastroesophageal reflux disease)    High cholesterol    History of kidney stones    Hx: UTI (urinary tract infection)    Hypertension    IBS (irritable bowel syndrome)    Interstitial cystitis 2012   Joint pain    Kidney problem    Migraine    Occipital neuralgia    Osteoarthritis    PNA (pneumonia) 2018   Pre-diabetes    Spinal stenosis    Trigeminal neuralgia    Past Surgical History:  Procedure Laterality Date   ABDOMINAL HYSTERECTOMY     ANKLE SURGERY Left 05/2009   APPENDECTOMY     Benign Tumor Removal Right 01/26/2017   right upper arm by Dr. Benard Rink at [facility] [place]   BRAIN SURGERY     CARPAL TUNNEL RELEASE Right 2014   CHOLECYSTECTOMY N/A 12/24/2020   Procedure: LAPAROSCOPIC CHOLECYSTECTOMY;  Surgeon: Abigail Miyamoto, MD;  Location: [facility] OR;  Service: General;  Laterality: N/A;   COLONOSCOPY     CYSTOSCOPY W/ URETERAL STENT PLACEMENT Left 07/25/2019   Procedure: CYSTOSCOPY WITH RETROGRADE PYELOGRAM/URETERAL STENT PLACEMENT;  Surgeon: Marcine Matar, MD;  Location: [facility] ORS;  Service: Urology;  Laterality: Left;    CYSTOSCOPY W/ URETERAL STENT REMOVAL Left 08/15/2019   Procedure: CYSTOSCOPY WITH STENT REMOVAL;  Surgeon: Marcine Matar, MD;  Location: [facility] ORS;  Service: Urology;  Laterality: Left;   CYSTOSCOPY WITH RETROGRADE PYELOGRAM, URETEROSCOPY AND STENT PLACEMENT Left 08/15/2019   Procedure: CYSTOSCOPY WITH RETROGRADE PYELOGRAM, URETEROSCOPY;  Surgeon: Marcine Matar, MD;  Location: [facility] OR[facility];  Service: Urology;  Laterality: Left;  90 MINS   CYSTOSCOPY WITH RETROGRADE PYELOGRAM, URETEROSCOPY AND STENT PLACEMENT Right 09/12/2019   Procedure: CYSTOSCOPY WITH RIGHT RETROGRADE PYELOGRAM  URETEROSCOPY WITH HOLMIUM LASER STONE EXTRACTION AND STENT PLACEMENT;  Surgeon: Marcine Matar, MD;  Location: [facility] OR[facility];  Service: Urology;  Laterality: Right;   DILATION AND CURETTAGE OF UTERUS     HOLMIUM LASER APPLICATION Left 08/15/2019   Procedure: HOLMIUM LASER APPLICATION;  Surgeon: Marcine Matar, MD;  Location: [facility] OR[facility];  Service: Urology;  Laterality: Left;   JOINT REPLACEMENT     R knee   KIDNEY STONE SURGERY     neuro spine similator  01/2021   OVARIAN CYST REMOVAL  05/2010   right knee revision  2016   ROTATOR CUFF REPAIR Right 02/2019   SPINE SURGERY     x 3   TUBAL LIGATION  Patient Active Problem List   Diagnosis Date Noted   Gastroesophageal reflux disease 12/18/2022   Spinal stenosis 12/18/2022   Obesity: Starting BMI 33.3 11/09/2022   BMI 30.0-30.9,adult Current BMI 30.9 11/09/2022   Hypertension, essential 07/26/2022   Class 1 obesity with serious comorbidity and body mass index (BMI) of 33.0 to 33.9 in adult 06/23/2022   Mood disorder (HCC)- with emotional eating 05/23/2022   Mixed hyperlipidemia 05/23/2022   Prediabetes 04/26/2022   Depression 04/26/2022   Allergic rhinitis 01/26/2022   Morbid obesity (HCC) 09/24/2021   Exertional chest pain 09/24/2021   S/P laparoscopic cholecystectomy 12/24/2020   RUQ abdominal pain 12/25/2019   Decreased range of knee movement 09/02/2019    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   Irritable bowel syndrome with constipation 09/02/2019   Calculus, ureter 07/24/2019   Chronic migraine without aura, with intractable migraine, so stated, with status migrainosus 05/30/2019   Chronic asthmatic bronchitis 05/14/2019   Excessive daytime sleepiness 01/08/2019   Upper airway cough syndrome 11/12/2018   Acute bacterial sinusitis 07/17/2018   Abnormal chest CT 07/17/2018   Trigeminal neuralgia of right side of face 02/01/2018   Occipital neuralgia 02/01/2018   Chronic migraine without aura without status migrainosus, not intractable 02/01/2018   Decreased strength 05/10/2017   Family history of breast cancer    Family history of prostate cancer    Chronic left shoulder pain 09/02/2015   Carpal tunnel syndrome 08/25/2011   Asthma 04/14/2009   Essential hypertension 04/14/2009   Hyperlipidemia 04/14/2009    PCP: Woodfin Ganja, MD   REFERRING PROVIDER: Venita Lick, MD  REFERRING DIAG:  Diagnosis  M54.59 (ICD-10-CM) - Other low back pain    Rationale for Evaluation and Treatment: Rehabilitation  THERAPY DIAG:  Pain, lumbar region - Plan: PT plan of care cert/re-cert  Muscle weakness (generalized) - Plan: PT plan of care cert/re-cert  Difficulty walking - Plan: PT plan of care cert/re-cert  ONSET DATE: March 2024  SUBJECTIVE:                                                                                                                                                                                           SUBJECTIVE STATEMENT: Pt states the L sided back pain now is new is very limiting. Waking up, she has pain bilaterally. Standing for too long significantly increases her pain. In the last 60 days, the L side has gotten worse. Pt had an injection that did not give relief. Walking too long, sweeping, cooking/dishes, mopping, tying shoes, long periods of sitting all cause pain. She is not able to dress LE  without pain. Pt does  have pain that comes down to the knee that has improved since injection. Standing for too long will cause radiating pain. Pain stops at just below the knee. Pt will also have back pain that also goes up towards her kidneys- cleared by urologist. Pt only able to stroll for about 10 mins without significant pain. Pt unable to carries groceries upstairs due to the pain at the knee and back. Previously able to lift case of water but no longer able to due to pain- part time job doing [employer].  Exercise- only able to walk for exercise, has gym access.   Previously had PT for shoulders and back at [facility]. No real strengthening exercise.    Currently, trigeminal neuralgia and occipital neuralgia flare. Had botox injections this morning and was told by MD to inform PT.   PERTINENT HISTORY:  2002 lumar micro discectomy  2007 C6-7 Fusion 2014 L5-S1 fusion  2015-2016 R knee TKA and revision  2022 lumbar nerve stimulator  PAIN:  Are you having pain? Yes: NPRS scale: 8/10 Pain location: localized to L lumbar with occasional Pain description: achy, sharp, "rod pushing through my back," stairs Aggravating factors: any bending, lifting, standing Relieving factors: No relief  PRECAUTIONS: nerve stimulator  WEIGHT BEARING RESTRICTIONS: No  FALLS:  Has patient fallen in last 6 months? Yes. 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 At L3-4 there is a broad-based disc bulge. Severe  bilateral facet arthropathy. Moderate spinal stenosis. Bilateral subarticular recess stenosis. Moderate left foraminal stenosis. Moderate-severe right foraminal stenosis. 2. At L4-5 there is a broad-based disc bulge. Moderate bilateral facet arthropathy with bilateral mild facet effusions. Severe spinal stenosis. Severe right foraminal stenosis. Mild-moderate left foraminal stenosis. 3. Posterior lumbar fusion at L5-S1 with a broad-based disc bulge. No spinal stenosis. Mild bilateral foraminal stenosis. 4. No acute osseous injury of the lumbar spine.     Fusion hardware noted at L5-S1 no abnormalities noted adjacent segment disease noted at L4-5 and L3-4.    PATIENT SURVEYS:  FOTO 32 40 @ DC 9 MCII  SCREENING FOR RED FLAGS: Bowel or bladder incontinence: No Spinal tumors: No Cauda equina syndrome: No Compression fracture: No Abdominal aneurysm: No  COGNITION: Overall cognitive status: Within functional limits for tasks assessed     SENSATION: Light touch: Impaired   POSTURE: rounded shoulders and decreased lumbar lordosis  PALPATION: TTP of L lumbar paraspinals and L QL; hypertonicity of bilat lumbar paraspinals and QL  LUMBAR ROM:   AROM eval  Flexion 60%  Extension R sided p!  Right lateral flexion 30%  Left lateral flexion 30%  Right rotation 50%  L p!  Left rotation 50%   (Blank rows = not tested)  LOWER EXTREMITY ROM:   unable to test hips due to increase in pain following lumbar AROM  Active  Right eval Left eval  Hip flexion    Hip extension    Hip abduction    Hip adduction    Hip internal rotation  Hip external rotation    Knee flexion 87 120  Knee extension -3 2  Ankle dorsiflexion    Ankle plantarflexion    Ankle inversion    Ankle eversion     (Blank rows = not tested)  LOWER EXTREMITY MMT:   Unable to test due to increase in pain following AROM  LUMBAR SPECIAL TESTS:  Straight leg raise test: Positive, FABER test: Positive, and  Trendelenburg sign: Positive  FUNCTIONAL TESTS:  5 times sit to stand: 17.2s  GAIT: Distance walked: 30ft Assistive device utilized: None Level of assistance: Complete Independence Comments: L trunk lean, lack of TKE on R, decrease toe off on R and L  TODAY'S TREATMENT:                                                                                                                              DATE: 5/28   Exercises - Seated Quadratus Lumborum Stretch in Chair  - 2 x daily - 7 x weekly - 1 sets - 3 reps - 30 hold - Standing Side Plank on Wall  - 2 x daily - 7 x weekly - 1 sets - 3 reps - 15 hold - Seated Pelvic Tilt  - 2 x daily - 7 x weekly - 2 sets - 10 reps     PATIENT EDUCATION:  Education details: MOI, diagnosis, prognosis, anatomy, exercise progression, DOMS expectations, muscle firing,  envelope of function, HEP, POC  Person educated: Patient Education method: Explanation, Demonstration, Tactile cues, Verbal cues, and Handouts Education comprehension: verbalized understanding, returned demonstration, verbal cues required, tactile cues required, and needs further education  HOME EXERCISE PROGRAM:  Access Code: [facility] URL: [email or web address] Date: 01/03/2023 Prepared by: Zebedee Iba    ASSESSMENT:  CLINICAL IMPRESSION: Patient is a 62 y.o. female who was seen today for physical therapy evaluation and treatment for c/c of chronic LBP. Pt's s/s appear consistent with lumbar radiculopathy and complex presentation given long standing history of LBP, multiple surgeries, as well as several concomitant pain syndromes contributing to her chronic pain. Pt's pain is highly sensitive and irritable with movement. Pt's is more pain dominant at this time. Plan to continue with aquatic therapy and land therapy for lumbar mobility and general functional mobility/LE strength at future sessions. Plan for progressive strengthening as pt's previous PT episodes did not target  resistance training. Pt would benefit from continued skilled therapy in order to reach goals and maximize functional lumbopelvic strength and ROM for prevention of further functional decline.    OBJECTIVE IMPAIRMENTS: decreased activity tolerance, decreased endurance, decreased mobility, decreased ROM, decreased strength, hypomobility, increased muscle spasms, impaired flexibility, improper body mechanics, postural dysfunction, and pain.   ACTIVITY LIMITATIONS: carrying, lifting, sleeping, transfers, dressing, and bending, squatting  PARTICIPATION LIMITATIONS: cleaning, laundry, yard work, community activity, and exercise  PERSONAL FACTORS: Age, Fitness, Time since onset of injury/illness/exacerbation, and 3+ comorbidities:  are also affecting patient's functional outcome.   REHAB POTENTIAL: Fair  CLINICAL DECISION MAKING: unstable/complicated  EVALUATION COMPLEXITY: Moderate   SHORT TERM GOALS: Target date: 02/14/2023   Pt will become independent with HEP in order to demonstrate synthesis of PT education.   Goal status: INITIAL  2.  Pt will report at least 2 pt reduction on NPRS scale for pain in order to demonstrate functional improvement with household activity, self care, and ADL.   Goal status: INITIAL  3. Pt will score at least 9 pt increase on FOTO to demonstrate functional improvement in MCII and pt perceived function.      Goal status: INITIAL  LONG TERM GOALS: Target date: 03/28/2023    Pt  will become independent with final HEP in order to demonstrate synthesis of PT education.  Baseline:  Goal status: INITIAL  2. Pt will score >/= 40 on FOTO to demonstrate improvement in perceived lumbar function.  Baseline:  Goal status: INITIAL  3.  Pt will be able to perform 5XSTS in under 12s  in order to demonstrate functional improvement above the cut off score for adults.  Goal status: INITIAL  4.  Pt will be able to demonstrate/report ability to walk >15 mins without  pain in order to demonstrate functional improvement and tolerance to exercise and community mobility.   Goal status: INITIAL PLAN:  PT FREQUENCY: 1-2x/week  PT DURATION: 12 weeks (likely to D/C by 8 with independent management)  PLANNED INTERVENTIONS: Therapeutic exercises, Therapeutic activity, Neuromuscular re-education, Balance training, Gait training, Patient/Family education, Self Care, Joint mobilization, Joint manipulation, Orthotic/Fit training, DME instructions, Aquatic Therapy, Dry Needling, Electrical stimulation, Spinal manipulation, Spinal mobilization, Cryotherapy, Moist heat, scar mobilization, Splintting, Taping, Vasopneumatic device, Traction, Ultrasound, Ionotophoresis 4mg /ml Dexamethasone, Manual therapy, and Re-evaluation.  PLAN FOR NEXT SESSION: aquatic to start for lumbar mobility, pain reduction with movement, and general LE strength; progress to land based strengthening/lifting  Zebedee Iba, PT 01/03/2023, 5:26 PM

## 2023-01-03 NOTE — Progress Notes (Signed)
.  Botox- 200 units x 1 vial Lot: [id number] Expiration:01/2025 NDC: 0023-3921-02  Bacteriostatic 0.9% Sodium Chloride- * mL  Lot: [id number] Expiration:11/07/2023 EAV:4098-11914-78  Dx:G43.709 S/P Witnessed GN:FAOZ Yetta Barre, RN

## 2023-01-06 ENCOUNTER — Encounter (HOSPITAL_BASED_OUTPATIENT_CLINIC_OR_DEPARTMENT_OTHER): Payer: Self-pay | Admitting: Physical Therapy

## 2023-01-06 ENCOUNTER — Ambulatory Visit (HOSPITAL_BASED_OUTPATIENT_CLINIC_OR_DEPARTMENT_OTHER): Payer: Medicare Other | Admitting: Physical Therapy

## 2023-01-06 DIAGNOSIS — M6281 Muscle weakness (generalized): Secondary | ICD-10-CM

## 2023-01-06 DIAGNOSIS — R262 Difficulty in walking, not elsewhere classified: Secondary | ICD-10-CM | POA: Diagnosis not present

## 2023-01-06 DIAGNOSIS — M545 Low back pain, unspecified: Secondary | ICD-10-CM

## 2023-01-06 NOTE — Therapy (Signed)
OUTPATIENT PHYSICAL THERAPY THORACOLUMBAR TREATMENT   Patient Name: Cassidy Olsen MRN: [id number] DOB:1961-02-20, 62 y.o., female Today's Date: 01/06/2023  END OF SESSION:  PT End of Session - 01/06/23 0830     Visit Number 2    Number of Visits 16    Date for PT Re-Evaluation 04/03/23    Authorization Type MCR    PT Start Time 0817    PT Stop Time 0855    PT Time Calculation (min) 38 min    Behavior During Therapy [facility] for tasks assessed/performed             Past Medical History:  Diagnosis Date   ADHD    Arthritis 2010   Asthma    Back pain    Chronic headaches    Chronic pain    Constipation    Edema of both lower extremities    Family history of breast cancer    Family history of prostate cancer    Gallstones 2018   GERD (gastroesophageal reflux disease)    High cholesterol    History of kidney stones    Hx: UTI (urinary tract infection)    Hypertension    IBS (irritable bowel syndrome)    Interstitial cystitis 2012   Joint pain    Kidney problem    Migraine    Occipital neuralgia    Osteoarthritis    PNA (pneumonia) 2018   Pre-diabetes    Spinal stenosis    Trigeminal neuralgia    Past Surgical History:  Procedure Laterality Date   ABDOMINAL HYSTERECTOMY     ANKLE SURGERY Left 05/2009   APPENDECTOMY     Benign Tumor Removal Right 01/26/2017   right upper arm by Dr. Benard Rink at [facility] [place] [place]   BRAIN SURGERY     CARPAL TUNNEL RELEASE Right 2014   CHOLECYSTECTOMY N/A 12/24/2020   Procedure: LAPAROSCOPIC CHOLECYSTECTOMY;  Surgeon: Abigail Miyamoto, MD;  Location: [facility] OR;  Service: General;  Laterality: N/A;   COLONOSCOPY     CYSTOSCOPY W/ URETERAL STENT PLACEMENT Left 07/25/2019   Procedure: CYSTOSCOPY WITH RETROGRADE PYELOGRAM/URETERAL STENT PLACEMENT;  Surgeon: Marcine Matar, MD;  Location: [facility] ORS;  Service: Urology;  Laterality: Left;   CYSTOSCOPY W/ URETERAL STENT REMOVAL Left 08/15/2019   Procedure: CYSTOSCOPY WITH STENT  REMOVAL;  Surgeon: Marcine Matar, MD;  Location: [facility];  Service: Urology;  Laterality: Left;   CYSTOSCOPY WITH RETROGRADE PYELOGRAM, URETEROSCOPY AND STENT PLACEMENT Left 08/15/2019   Procedure: CYSTOSCOPY WITH RETROGRADE PYELOGRAM, URETEROSCOPY;  Surgeon: Marcine Matar, MD;  Location: [facility];  Service: Urology;  Laterality: Left;  90 MINS   CYSTOSCOPY WITH RETROGRADE PYELOGRAM, URETEROSCOPY AND STENT PLACEMENT Right 09/12/2019   Procedure: CYSTOSCOPY WITH RIGHT RETROGRADE PYELOGRAM  URETEROSCOPY WITH HOLMIUM LASER STONE EXTRACTION AND STENT PLACEMENT;  Surgeon: Marcine Matar, MD;  Location: [facility];  Service: Urology;  Laterality: Right;   DILATION AND CURETTAGE OF UTERUS     HOLMIUM LASER APPLICATION Left 08/15/2019   Procedure: HOLMIUM LASER APPLICATION;  Surgeon: Marcine Matar, MD;  Location: [facility];  Service: Urology;  Laterality: Left;   JOINT REPLACEMENT     R knee   KIDNEY STONE SURGERY     neuro spine similator  01/2021   OVARIAN CYST REMOVAL  05/2010   right knee revision  2016   ROTATOR CUFF REPAIR Right 02/2019   SPINE SURGERY     x 3   TUBAL LIGATION     Patient Active Problem List   Diagnosis Date  Noted   Gastroesophageal reflux disease 12/18/2022   Spinal stenosis 12/18/2022   Obesity: Starting BMI 33.3 11/09/2022   BMI 30.0-30.9,adult Current BMI 30.9 11/09/2022   Hypertension, essential 07/26/2022   Class 1 obesity with serious comorbidity and body mass index (BMI) of 33.0 to 33.9 in adult 06/23/2022   Mood disorder (HCC)- with emotional eating 05/23/2022   Mixed hyperlipidemia 05/23/2022   Prediabetes 04/26/2022   Depression 04/26/2022   Allergic rhinitis 01/26/2022   Morbid obesity (HCC) 09/24/2021   Exertional chest pain 09/24/2021   S/P laparoscopic cholecystectomy 12/24/2020   RUQ abdominal pain 12/25/2019   Decreased range of knee movement 09/02/2019   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   Irritable bowel syndrome with  constipation 09/02/2019   Calculus, ureter 07/24/2019   Chronic migraine without aura, with intractable migraine, so stated, with status migrainosus 05/30/2019   Chronic asthmatic bronchitis 05/14/2019   Excessive daytime sleepiness 01/08/2019   Upper airway cough syndrome 11/12/2018   Acute bacterial sinusitis 07/17/2018   Abnormal chest CT 07/17/2018   Trigeminal neuralgia of right side of face 02/01/2018   Occipital neuralgia 02/01/2018   Chronic migraine without aura without status migrainosus, not intractable 02/01/2018   Decreased strength 05/10/2017   Family history of breast cancer    Family history of prostate cancer    Chronic left shoulder pain 09/02/2015   Carpal tunnel syndrome 08/25/2011   Asthma 04/14/2009   Essential hypertension 04/14/2009   Hyperlipidemia 04/14/2009    PCP: Woodfin Ganja, MD   REFERRING PROVIDER: Venita Lick, MD  REFERRING DIAG:  Diagnosis  M54.59 (ICD-10-CM) - Other low back pain    Rationale for Evaluation and Treatment: Rehabilitation  THERAPY DIAG:  Pain, lumbar region  Muscle weakness (generalized)  Difficulty walking  ONSET DATE: March 2024  SUBJECTIVE:                                                                                                                                                                                           SUBJECTIVE STATEMENT: Pt reports she is getting an facet injection on Monday after PT.  No other changes since eval.      FROM EVAL: Pt states the L sided back pain now is new is very limiting. Waking up, she has pain bilaterally. Standing for too long significantly increases her pain. In the last 60 days, the L side has gotten worse. Pt had an injection that did not give relief. Walking too long, sweeping, cooking/dishes, mopping, tying shoes, long periods of sitting all cause pain. She is not able to dress LE without pain. Pt does have pain that comes down  to the knee that has improved  since injection. Standing for too long will cause radiating pain. Pain stops at just below the knee. Pt will also have back pain that also goes up towards her kidneys- cleared by urologist. Pt only able to stroll for about 10 mins without significant pain. Pt unable to carries groceries upstairs due to the pain at the knee and back. Previously able to lift case of water but no longer able to due to pain- part time job doing [employer].  Exercise- only able to walk for exercise, has gym access.   Previously had PT for shoulders and back at [facility]. No real strengthening exercise.    Currently, trigeminal neuralgia and occipital neuralgia flare. Had botox injections this morning and was told by MD to inform PT.   PERTINENT HISTORY:  2002 lumar micro discectomy  2007 C6-7 Fusion 2014 L5-S1 fusion  2015-2016 R knee TKA and revision  2022 lumbar nerve stimulator  PAIN:  Are you having pain? Yes: NPRS scale: 7/10 Pain location: localized to L lumbar  Pain description: achy, sharp, "rod pushing through my back," stairs Aggravating factors: any bending, lifting, standing Relieving factors: No relief  PRECAUTIONS: nerve stimulator  WEIGHT BEARING RESTRICTIONS: No  FALLS:  Has patient fallen in last 6 months? Yes. 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 At L3-4 there is a broad-based disc bulge. Severe bilateral facet arthropathy. Moderate spinal stenosis. Bilateral subarticular  recess stenosis. Moderate left foraminal stenosis. Moderate-severe right foraminal stenosis. 2. At L4-5 there is a broad-based disc bulge. Moderate bilateral facet arthropathy with bilateral mild facet effusions. Severe spinal stenosis. Severe right foraminal stenosis. Mild-moderate left foraminal stenosis. 3. Posterior lumbar fusion at L5-S1 with a broad-based disc bulge. No spinal stenosis. Mild bilateral foraminal stenosis. 4. No acute osseous injury of the lumbar spine.     Fusion hardware noted at L5-S1 no abnormalities noted adjacent segment disease noted at L4-5 and L3-4.    PATIENT SURVEYS:  FOTO 32 40 @ DC 9 MCII  SCREENING FOR RED FLAGS: Bowel or bladder incontinence: No Spinal tumors: No Cauda equina syndrome: No Compression fracture: No Abdominal aneurysm: No  COGNITION: Overall cognitive status: Within functional limits for tasks assessed     SENSATION: Light touch: Impaired   POSTURE: rounded shoulders and decreased lumbar lordosis  PALPATION: TTP of L lumbar paraspinals and L QL; hypertonicity of bilat lumbar paraspinals and QL  LUMBAR ROM:   AROM eval  Flexion 60%  Extension R sided p!  Right lateral flexion 30%  Left lateral flexion 30%  Right rotation 50%  L p!  Left rotation 50%   (Blank rows = not tested)  LOWER EXTREMITY ROM:   unable to test hips due to increase in pain following lumbar AROM  Active  Right eval Left eval  Hip flexion    Hip extension    Hip abduction    Hip adduction    Hip internal rotation    Hip external rotation  Knee flexion 87 120  Knee extension -3 2  Ankle dorsiflexion    Ankle plantarflexion    Ankle inversion    Ankle eversion     (Blank rows = not tested)  LOWER EXTREMITY MMT:   Unable to test due to increase in pain following AROM  LUMBAR SPECIAL TESTS:  Straight leg raise test: Positive, FABER test: Positive, and Trendelenburg sign: Positive  FUNCTIONAL TESTS:  5 times sit to stand:  17.2s  GAIT: Distance walked: 63ft Assistive device utilized: None Level of assistance: Complete Independence Comments: L trunk lean, lack of TKE on R, decrease toe off on R and L  TODAY'S TREATMENT:                                                                                                                              DATE: 5/31 Pt seen for aquatic therapy today.  Treatment took place in water 3.5-4.75 ft in depth at the [facility] pool. Temp of water was 91.  Pt entered/exited the pool via stairs independently with bilat rail. * intro to aquatic therapy principles * unsupported submerged to chest deep water : walking forward, backward and side stepping * relaxed squats in shallow water with attempts at vertical trunk (limited Rt knee flexion, cues for neutral Rt foot vs turned out) * R/L/R hip flexor stretch x 15s each * TrA set with short blue noodle pull down to thighs x 5, repeated with solid noodle x 5 (painful) * side stepping with rainbow hand floats and arm addct * walking forward/ backward with rainbow hand floats at sides ( good tolerance) * L stretch with gentle wag tail; L stretch with knees slightly bent * straddling yellow noodle with yellow hand floats: cycling with cues for more even LE pedaling  Pt requires the buoyancy and hydrostatic pressure of water for support, and to offload joints by unweighting joint load by at least 50 % in navel deep water and by at least 75-80% in chest to neck deep water.  Viscosity of the water is needed for resistance of strengthening. Water current perturbations provides challenge to standing balance requiring increased core activation.   PATIENT EDUCATION:  Education details: reacquainting to aquatic therapy  Person educated: Patient Education method: Explanation, Demonstration, Tactile cues, Verbal cues,  Education comprehension: verbalized understanding, returned demonstration, verbal cues required, tactile cues  required, and needs further education  HOME EXERCISE PROGRAM:  Access Code: [facility] URL: [email or web address] Date: 01/03/2023 Prepared by: Zebedee Iba    ASSESSMENT:  CLINICAL IMPRESSION: Pt has previously completed aquatic therapy >7 yrs ago prior to moving to [facility]. Pt is confident in aquatic environment and able to take direction from therapist on deck.  She does not require any UE support when away from the wall.   She is observed with limited in Rt knee flexion ROM (from previous TKA revision), and has tightness in her Rt hip flexor - affecting gait and functional mobility. Began  discussing body mechanics with tasks like unloading washer with golfer's lift.  She reported some mild increase in Lt lower back pain with TrA set with solid noodle. Overall reduction in pain to 5/10 at end of session.  She does not currently belong to a pool, but may consider it if helpful.    FROM EVAL: Patient is a 62 y.o. female who was seen today for physical therapy evaluation and treatment for c/c of chronic LBP. Pt's s/s appear consistent with lumbar radiculopathy and complex presentation given long standing history of LBP, multiple surgeries, as well as several concomitant pain syndromes contributing to her chronic pain. Pt's pain is highly sensitive and irritable with movement. Pt's is more pain dominant at this time. Plan to continue with aquatic therapy and land therapy for lumbar mobility and general functional mobility/LE strength at future sessions. Plan for progressive strengthening as pt's previous PT episodes did not target resistance training. Pt would benefit from continued skilled therapy in order to reach goals and maximize functional lumbopelvic strength and ROM for prevention of further functional decline.    OBJECTIVE IMPAIRMENTS: decreased activity tolerance, decreased endurance, decreased mobility, decreased ROM, decreased strength, hypomobility, increased muscle spasms,  impaired flexibility, improper body mechanics, postural dysfunction, and pain.   ACTIVITY LIMITATIONS: carrying, lifting, sleeping, transfers, dressing, and bending, squatting  PARTICIPATION LIMITATIONS: cleaning, laundry, yard work, community activity, and exercise  PERSONAL FACTORS: Age, Fitness, Time since onset of injury/illness/exacerbation, and 3+ comorbidities:  are also affecting patient's functional outcome.   REHAB POTENTIAL: Fair  CLINICAL DECISION MAKING: unstable/complicated  EVALUATION COMPLEXITY: Moderate   SHORT TERM GOALS: Target date: 02/14/2023   Pt will become independent with HEP in order to demonstrate synthesis of PT education.   Goal status: INITIAL  2.  Pt will report at least 2 pt reduction on NPRS scale for pain in order to demonstrate functional improvement with household activity, self care, and ADL.   Goal status: INITIAL  3. Pt will score at least 9 pt increase on FOTO to demonstrate functional improvement in MCII and pt perceived function.      Goal status: INITIAL  LONG TERM GOALS: Target date: 03/28/2023    Pt  will become independent with final HEP in order to demonstrate synthesis of PT education.  Baseline:  Goal status: INITIAL  2. Pt will score >/= 40 on FOTO to demonstrate improvement in perceived lumbar function.  Baseline:  Goal status: INITIAL  3.  Pt will be able to perform 5XSTS in under 12s  in order to demonstrate functional improvement above the cut off score for adults.  Goal status: INITIAL  4.  Pt will be able to demonstrate/report ability to walk >15 mins without pain in order to demonstrate functional improvement and tolerance to exercise and community mobility.   Goal status: INITIAL PLAN:  PT FREQUENCY: 1x/week  PT DURATION: 12 weeks (likely to D/C by 8 with independent management)  PLANNED INTERVENTIONS: Therapeutic exercises, Therapeutic activity, Neuromuscular re-education, Balance training, Gait training,  Patient/Family education, Self Care, Joint mobilization, Joint manipulation, Orthotic/Fit training, DME instructions, Aquatic Therapy, Dry Needling, Electrical stimulation, Spinal manipulation, Spinal mobilization, Cryotherapy, Moist heat, scar mobilization, Splintting, Taping, Vasopneumatic device, Traction, Ultrasound, Ionotophoresis 4mg /ml Dexamethasone, Manual therapy, and Re-evaluation.  PLAN FOR NEXT SESSION: aquatic to start for lumbar mobility, pain reduction with movement, and general LE strength; progress to land based strengthening/lifting  Mayer Camel, PTA 01/06/23 9:30 AM [facility] [address]  [place], [place], [address]  Phone: [phone]   Fax:  [phone]

## 2023-01-09 ENCOUNTER — Encounter (HOSPITAL_BASED_OUTPATIENT_CLINIC_OR_DEPARTMENT_OTHER): Payer: Self-pay | Admitting: Physical Therapy

## 2023-01-09 ENCOUNTER — Ambulatory Visit (HOSPITAL_BASED_OUTPATIENT_CLINIC_OR_DEPARTMENT_OTHER): Payer: Medicare Other | Attending: Orthopedic Surgery | Admitting: Physical Therapy

## 2023-01-09 DIAGNOSIS — R262 Difficulty in walking, not elsewhere classified: Secondary | ICD-10-CM | POA: Insufficient documentation

## 2023-01-09 DIAGNOSIS — M545 Low back pain, unspecified: Secondary | ICD-10-CM | POA: Diagnosis not present

## 2023-01-09 DIAGNOSIS — M47816 Spondylosis without myelopathy or radiculopathy, lumbar region: Secondary | ICD-10-CM | POA: Diagnosis not present

## 2023-01-09 DIAGNOSIS — M6281 Muscle weakness (generalized): Secondary | ICD-10-CM | POA: Insufficient documentation

## 2023-01-09 NOTE — Therapy (Signed)
OUTPATIENT PHYSICAL THERAPY THORACOLUMBAR TREATMENT   Patient Name: Cassidy Olsen MRN: [id number] DOB:27-May-1961, 62 y.o., female Today's Date: 01/09/2023  END OF SESSION:  PT End of Session - 01/09/23 1035     Visit Number 3    Number of Visits 16    Date for PT Re-Evaluation 04/03/23    Authorization Type MCR    PT Start Time 1035    PT Stop Time 1115    PT Time Calculation (min) 40 min    Activity Tolerance Patient tolerated treatment well    Behavior During Therapy [facility] for tasks assessed/performed             Past Medical History:  Diagnosis Date   ADHD    Arthritis 2010   Asthma    Back pain    Chronic headaches    Chronic pain    Constipation    Edema of both lower extremities    Family history of breast cancer    Family history of prostate cancer    Gallstones 2018   GERD (gastroesophageal reflux disease)    High cholesterol    History of kidney stones    Hx: UTI (urinary tract infection)    Hypertension    IBS (irritable bowel syndrome)    Interstitial cystitis 2012   Joint pain    Kidney problem    Migraine    Occipital neuralgia    Osteoarthritis    PNA (pneumonia) 2018   Pre-diabetes    Spinal stenosis    Trigeminal neuralgia    Past Surgical History:  Procedure Laterality Date   ABDOMINAL HYSTERECTOMY     ANKLE SURGERY Left 05/2009   APPENDECTOMY     Benign Tumor Removal Right 01/26/2017   right upper arm by Dr. Benard Rink at [facility] [place]   BRAIN SURGERY     CARPAL TUNNEL RELEASE Right 2014   CHOLECYSTECTOMY N/A 12/24/2020   Procedure: LAPAROSCOPIC CHOLECYSTECTOMY;  Surgeon: Abigail Miyamoto, MD;  Location: [facility] OR;  Service: General;  Laterality: N/A;   COLONOSCOPY     CYSTOSCOPY W/ URETERAL STENT PLACEMENT Left 07/25/2019   Procedure: CYSTOSCOPY WITH RETROGRADE PYELOGRAM/URETERAL STENT PLACEMENT;  Surgeon: Marcine Matar, MD;  Location: [facility] ORS;  Service: Urology;  Laterality: Left;   CYSTOSCOPY W/ URETERAL STENT  REMOVAL Left 08/15/2019   Procedure: CYSTOSCOPY WITH STENT REMOVAL;  Surgeon: Marcine Matar, MD;  Location: [facility];  Service: Urology;  Laterality: Left;   CYSTOSCOPY WITH RETROGRADE PYELOGRAM, URETEROSCOPY AND STENT PLACEMENT Left 08/15/2019   Procedure: CYSTOSCOPY WITH RETROGRADE PYELOGRAM, URETEROSCOPY;  Surgeon: Marcine Matar, MD;  Location: [facility];  Service: Urology;  Laterality: Left;  90 MINS   CYSTOSCOPY WITH RETROGRADE PYELOGRAM, URETEROSCOPY AND STENT PLACEMENT Right 09/12/2019   Procedure: CYSTOSCOPY WITH RIGHT RETROGRADE PYELOGRAM  URETEROSCOPY WITH HOLMIUM LASER STONE EXTRACTION AND STENT PLACEMENT;  Surgeon: Marcine Matar, MD;  Location: [facility];  Service: Urology;  Laterality: Right;   DILATION AND CURETTAGE OF UTERUS     HOLMIUM LASER APPLICATION Left 08/15/2019   Procedure: HOLMIUM LASER APPLICATION;  Surgeon: Marcine Matar, MD;  Location: [facility];  Service: Urology;  Laterality: Left;   JOINT REPLACEMENT     R knee   KIDNEY STONE SURGERY     neuro spine similator  01/2021   OVARIAN CYST REMOVAL  05/2010   right knee revision  2016   ROTATOR CUFF REPAIR Right 02/2019   SPINE SURGERY     x 3   TUBAL LIGATION  Patient Active Problem List   Diagnosis Date Noted   Gastroesophageal reflux disease 12/18/2022   Spinal stenosis 12/18/2022   Obesity: Starting BMI 33.3 11/09/2022   BMI 30.0-30.9,adult Current BMI 30.9 11/09/2022   Hypertension, essential 07/26/2022   Class 1 obesity with serious comorbidity and body mass index (BMI) of 33.0 to 33.9 in adult 06/23/2022   Mood disorder (HCC)- with emotional eating 05/23/2022   Mixed hyperlipidemia 05/23/2022   Prediabetes 04/26/2022   Depression 04/26/2022   Allergic rhinitis 01/26/2022   Morbid obesity (HCC) 09/24/2021   Exertional chest pain 09/24/2021   S/P laparoscopic cholecystectomy 12/24/2020   RUQ abdominal pain 12/25/2019   Decreased range of knee movement 09/02/2019   Congenital cystic kidney  disease 09/02/2019   Irritable bowel syndrome with constipation 09/02/2019   Calculus, ureter 07/24/2019   Chronic migraine without aura, with intractable migraine, so stated, with status migrainosus 05/30/2019   Chronic asthmatic bronchitis 05/14/2019   Excessive daytime sleepiness 01/08/2019   Upper airway cough syndrome 11/12/2018   Acute bacterial sinusitis 07/17/2018   Abnormal chest CT 07/17/2018   Trigeminal neuralgia of right side of face 02/01/2018   Occipital neuralgia 02/01/2018   Chronic migraine without aura without status migrainosus, not intractable 02/01/2018   Decreased strength 05/10/2017   Family history of breast cancer    Family history of prostate cancer    Chronic left shoulder pain 09/02/2015   Carpal tunnel syndrome 08/25/2011   Asthma 04/14/2009   Essential hypertension 04/14/2009   Hyperlipidemia 04/14/2009    PCP: Woodfin Ganja, MD   REFERRING PROVIDER: Venita Lick, MD  REFERRING DIAG:  Diagnosis  M54.59 (ICD-10-CM) - Other low back pain    Rationale for Evaluation and Treatment: Rehabilitation  THERAPY DIAG:  Pain, lumbar region  Muscle weakness (generalized)  Difficulty walking  ONSET DATE: March 2024  SUBJECTIVE:                                                                                                                                                                                           SUBJECTIVE STATEMENT: Pt reports minimal reduction in pain after last session. Did a lot yesterday and pain is in bilat hips/back.  Have changed the settings on my stimulator which has only helped a little.  Facet injection today     FROM EVAL: Pt states the L sided back pain now is new is very limiting. Waking up, she has pain bilaterally. Standing for too long significantly increases her pain. In the last 60 days, the L side has gotten worse. Pt had an injection that did not give relief. Walking too long, sweeping, cooking/dishes,  mopping, tying shoes, long periods of sitting all cause pain. She is not able to dress LE without pain. Pt does have pain that comes down to the knee that has improved since injection. Standing for too long will cause radiating pain. Pain stops at just below the knee. Pt will also have back pain that also goes up towards her kidneys- cleared by urologist. Pt only able to stroll for about 10 mins without significant pain. Pt unable to carries groceries upstairs due to the pain at the knee and back. Previously able to lift case of water but no longer able to due to pain- part time job doing [employer].  Exercise- only able to walk for exercise, has gym access.   Previously had PT for shoulders and back at [facility]. No real strengthening exercise.    Currently, trigeminal neuralgia and occipital neuralgia flare. Had botox injections this morning and was told by MD to inform PT.   PERTINENT HISTORY:  2002 lumar micro discectomy  2007 C6-7 Fusion 2014 L5-S1 fusion  2015-2016 R knee TKA and revision  2022 lumbar nerve stimulator  PAIN:  Are you having pain? Yes: NPRS scale: 6/10 Pain location: localized to L lumbar  Pain description: achy, sharp, "rod pushing through my back," stairs Aggravating factors: any bending, lifting, standing Relieving factors: No relief  PRECAUTIONS: nerve stimulator  WEIGHT BEARING RESTRICTIONS: No  FALLS:  Has patient fallen in last 6 months? Yes. 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 At L3-4 there is a broad-based disc bulge. Severe bilateral facet arthropathy. Moderate spinal stenosis. Bilateral subarticular recess stenosis. Moderate left foraminal stenosis. Moderate-severe right foraminal stenosis. 2. At L4-5 there is a broad-based disc bulge. Moderate bilateral facet arthropathy with bilateral mild facet effusions. Severe spinal stenosis. Severe right foraminal stenosis. Mild-moderate left foraminal stenosis. 3. Posterior lumbar fusion at L5-S1 with a broad-based disc bulge. No spinal stenosis. Mild bilateral foraminal stenosis. 4. No acute osseous injury of the lumbar spine.     Fusion hardware noted at L5-S1 no abnormalities noted adjacent segment disease noted at L4-5 and L3-4.    PATIENT SURVEYS:  FOTO 32 40 @ DC 9 MCII  SCREENING FOR RED FLAGS: Bowel or bladder incontinence: No Spinal tumors: No Cauda equina syndrome: No Compression fracture: No Abdominal aneurysm: No  COGNITION: Overall cognitive status: Within functional limits for tasks assessed     SENSATION: Light touch: Impaired   POSTURE: rounded shoulders and decreased lumbar lordosis  PALPATION: TTP of L lumbar paraspinals and L QL; hypertonicity of bilat lumbar paraspinals and QL  LUMBAR ROM:   AROM eval  Flexion 60%  Extension R sided p!  Right lateral flexion 30%  Left lateral flexion 30%  Right rotation 50%  L p!  Left rotation 50%   (Blank rows = not tested)  LOWER EXTREMITY ROM:   unable to test hips due to increase in pain following lumbar AROM  Active  Right eval Left eval  Hip flexion  Hip extension    Hip abduction    Hip adduction    Hip internal rotation    Hip external rotation    Knee flexion 87 120  Knee extension -3 2  Ankle dorsiflexion    Ankle plantarflexion    Ankle inversion    Ankle eversion     (Blank rows = not tested)  LOWER EXTREMITY MMT:   Unable to test due to increase in pain following  AROM  LUMBAR SPECIAL TESTS:  Straight leg raise test: Positive, FABER test: Positive, and Trendelenburg sign: Positive  FUNCTIONAL TESTS:  5 times sit to stand: 17.2s  GAIT: Distance walked: 53ft Assistive device utilized: None Level of assistance: Complete Independence Comments: L trunk lean, lack of TKE on R, decrease toe off on R and L  TODAY'S TREATMENT:                                                                                                                              DATE: 5/31 Pt seen for aquatic therapy today.  Treatment took place in water 3.5-4.75 ft in depth at the [facility] pool. Temp of water was 91.  Pt entered/exited the pool via stairs independently with bilat rail.  * unsupported submerged to chest deep water : walking forward, backward and side stepping * L stretch with knees slightly bent *pelvic tilting seated on noodle; hip hiking; rotation * walking forward/ backward with rainbow hand floats at sides ( some pain left LB); then left side only * relaxed squats in 4.0 ue support yellow noodle; gentle lumber rotation * straddling yellow noodle with yellow hand floats: cycling ; hip add/abd; skiing  - relaxed squats; toe rasies, heel raises; hip add/abd 2x5; hip flex lle 2x5 *cycling on noodle end of session for further pain relief  Pt requires the buoyancy and hydrostatic pressure of water for support, and to offload joints by unweighting joint load by at least 50 % in navel deep water and by at least 75-80% in chest to neck deep water.  Viscosity of the water is needed for resistance of strengthening. Water current perturbations provides challenge to standing balance requiring increased core activation.   PATIENT EDUCATION:  Education details: reacquainting to aquatic therapy  Person educated: Patient Education method: Explanation, Demonstration, Tactile cues, Verbal cues,  Education comprehension: verbalized understanding, returned  demonstration, verbal cues required, tactile cues required, and needs further education  HOME EXERCISE PROGRAM:  Access Code: [facility] URL: [email or web address] Date: 01/03/2023 Prepared by: Zebedee Iba    ASSESSMENT:  CLINICAL IMPRESSION: Pt reports good stretch and "feeling relief" with ant/post pelvic tilting on noodle. She does report left side LBP throughout most of session. Modified positioning and posture when able. Straddling noodle vertically suspended most comfortable position. She was able to tolerate LE exercises with improvement while straddling noodle.  She did have slight reduction in pain upon completion to 4/10.  Has appt for facet injection at noon.  Goals  ongoing Check appts next session should be 1xweek    FROM EVAL: Patient is a 62 y.o. female who was seen today for physical therapy evaluation and treatment for c/c of chronic LBP. Pt's s/s appear consistent with lumbar radiculopathy and complex presentation given long standing history of LBP, multiple surgeries, as well as several concomitant pain syndromes contributing to her chronic pain. Pt's pain is highly sensitive and irritable with movement. Pt's is more pain dominant at this time. Plan to continue with aquatic therapy and land therapy for lumbar mobility and general functional mobility/LE strength at future sessions. Plan for progressive strengthening as pt's previous PT episodes did not target resistance training. Pt would benefit from continued skilled therapy in order to reach goals and maximize functional lumbopelvic strength and ROM for prevention of further functional decline.    OBJECTIVE IMPAIRMENTS: decreased activity tolerance, decreased endurance, decreased mobility, decreased ROM, decreased strength, hypomobility, increased muscle spasms, impaired flexibility, improper body mechanics, postural dysfunction, and pain.   ACTIVITY LIMITATIONS: carrying, lifting, sleeping, transfers, dressing,  and bending, squatting  PARTICIPATION LIMITATIONS: cleaning, laundry, yard work, community activity, and exercise  PERSONAL FACTORS: Age, Fitness, Time since onset of injury/illness/exacerbation, and 3+ comorbidities:  are also affecting patient's functional outcome.   REHAB POTENTIAL: Fair  CLINICAL DECISION MAKING: unstable/complicated  EVALUATION COMPLEXITY: Moderate   SHORT TERM GOALS: Target date: 02/14/2023   Pt will become independent with HEP in order to demonstrate synthesis of PT education.   Goal status: INITIAL  2.  Pt will report at least 2 pt reduction on NPRS scale for pain in order to demonstrate functional improvement with household activity, self care, and ADL.   Goal status: INITIAL  3. Pt will score at least 9 pt increase on FOTO to demonstrate functional improvement in MCII and pt perceived function.      Goal status: INITIAL  LONG TERM GOALS: Target date: 03/28/2023    Pt  will become independent with final HEP in order to demonstrate synthesis of PT education.  Baseline:  Goal status: INITIAL  2. Pt will score >/= 40 on FOTO to demonstrate improvement in perceived lumbar function.  Baseline:  Goal status: INITIAL  3.  Pt will be able to perform 5XSTS in under 12s  in order to demonstrate functional improvement above the cut off score for adults.  Goal status: INITIAL  4.  Pt will be able to demonstrate/report ability to walk >15 mins without pain in order to demonstrate functional improvement and tolerance to exercise and community mobility.   Goal status: INITIAL PLAN:  PT FREQUENCY: 1x/week  PT DURATION: 12 weeks (likely to D/C by 8 with independent management)  PLANNED INTERVENTIONS: Therapeutic exercises, Therapeutic activity, Neuromuscular re-education, Balance training, Gait training, Patient/Family education, Self Care, Joint mobilization, Joint manipulation, Orthotic/Fit training, DME instructions, Aquatic Therapy, Dry Needling,  Electrical stimulation, Spinal manipulation, Spinal mobilization, Cryotherapy, Moist heat, scar mobilization, Splintting, Taping, Vasopneumatic device, Traction, Ultrasound, Ionotophoresis 4mg /ml Dexamethasone, Manual therapy, and Re-evaluation.  PLAN FOR NEXT SESSION: aquatic to start for lumbar mobility, pain reduction with movement, and general LE strength; progress to land based strengthening/lifting  Corrie Dandy Tomma Lightning) Cambria Osten MPT 01/09/23 11:46 AM [facility] [address] [place], [place], [address] Phone: [phone]   Fax:  [phone]

## 2023-01-09 NOTE — Addendum Note (Signed)
Addended by: Zebedee Iba on: 01/09/2023 12:14 PM   Modules accepted: Orders

## 2023-01-10 ENCOUNTER — Ambulatory Visit (HOSPITAL_COMMUNITY): Admit: 2023-01-10 | Payer: Medicare Other

## 2023-01-10 ENCOUNTER — Ambulatory Visit (HOSPITAL_COMMUNITY): Admission: RE | Admit: 2023-01-10 | Payer: Medicare Other | Source: Ambulatory Visit

## 2023-01-10 ENCOUNTER — Encounter (HOSPITAL_COMMUNITY): Payer: Self-pay

## 2023-01-10 ENCOUNTER — Ambulatory Visit (HOSPITAL_COMMUNITY): Payer: Medicare Other

## 2023-01-11 ENCOUNTER — Ambulatory Visit (HOSPITAL_COMMUNITY): Admit: 2023-01-11 | Payer: Medicare Other

## 2023-01-11 ENCOUNTER — Other Ambulatory Visit: Payer: Self-pay | Admitting: Cardiovascular Disease

## 2023-01-11 ENCOUNTER — Encounter (HOSPITAL_BASED_OUTPATIENT_CLINIC_OR_DEPARTMENT_OTHER): Payer: Self-pay | Admitting: Physical Therapy

## 2023-01-11 ENCOUNTER — Ambulatory Visit (HOSPITAL_BASED_OUTPATIENT_CLINIC_OR_DEPARTMENT_OTHER): Payer: Medicare Other | Admitting: Physical Therapy

## 2023-01-11 ENCOUNTER — Ambulatory Visit (HOSPITAL_COMMUNITY): Payer: Medicare Other

## 2023-01-11 ENCOUNTER — Other Ambulatory Visit (HOSPITAL_COMMUNITY): Payer: Medicare Other

## 2023-01-11 ENCOUNTER — Encounter (HOSPITAL_COMMUNITY): Payer: Self-pay

## 2023-01-11 DIAGNOSIS — M6281 Muscle weakness (generalized): Secondary | ICD-10-CM | POA: Diagnosis not present

## 2023-01-11 DIAGNOSIS — M545 Low back pain, unspecified: Secondary | ICD-10-CM

## 2023-01-11 DIAGNOSIS — R262 Difficulty in walking, not elsewhere classified: Secondary | ICD-10-CM | POA: Diagnosis not present

## 2023-01-11 NOTE — Therapy (Signed)
OUTPATIENT PHYSICAL THERAPY THORACOLUMBAR TREATMENT   Patient Name: Cassidy Olsen MRN: [id number] DOB:07/25/1961, 62 y.o., female Today's Date: 01/11/2023  END OF SESSION:  PT End of Session - 01/11/23 0738     Visit Number 4    Number of Visits 16    Date for PT Re-Evaluation 04/03/23    Authorization Type MCR    PT Start Time 0730    PT Stop Time 0810    PT Time Calculation (min) 40 min    Behavior During Therapy [facility] for tasks assessed/performed             Past Medical History:  Diagnosis Date   ADHD    Arthritis 2010   Asthma    Back pain    Chronic headaches    Chronic pain    Constipation    Edema of both lower extremities    Family history of breast cancer    Family history of prostate cancer    Gallstones 2018   GERD (gastroesophageal reflux disease)    High cholesterol    History of kidney stones    Hx: UTI (urinary tract infection)    Hypertension    IBS (irritable bowel syndrome)    Interstitial cystitis 2012   Joint pain    Kidney problem    Migraine    Occipital neuralgia    Osteoarthritis    PNA (pneumonia) 2018   Pre-diabetes    Spinal stenosis    Trigeminal neuralgia    Past Surgical History:  Procedure Laterality Date   ABDOMINAL HYSTERECTOMY     ANKLE SURGERY Left 05/2009   APPENDECTOMY     Benign Tumor Removal Right 01/26/2017   right upper arm by Dr. Benard Rink at [facility] [place]   BRAIN SURGERY     CARPAL TUNNEL RELEASE Right 2014   CHOLECYSTECTOMY N/A 12/24/2020   Procedure: LAPAROSCOPIC CHOLECYSTECTOMY;  Surgeon: Abigail Miyamoto, MD;  Location: [facility] OR;  Service: General;  Laterality: N/A;   COLONOSCOPY     CYSTOSCOPY W/ URETERAL STENT PLACEMENT Left 07/25/2019   Procedure: CYSTOSCOPY WITH RETROGRADE PYELOGRAM/URETERAL STENT PLACEMENT;  Surgeon: Marcine Matar, MD;  Location: [facility] ORS;  Service: Urology;  Laterality: Left;   CYSTOSCOPY W/ URETERAL STENT REMOVAL Left 08/15/2019   Procedure: CYSTOSCOPY WITH STENT  REMOVAL;  Surgeon: Marcine Matar, MD;  Location: [facility] ORS;  Service: Urology;  Laterality: Left;   CYSTOSCOPY WITH RETROGRADE PYELOGRAM, URETEROSCOPY AND STENT PLACEMENT Left 08/15/2019   Procedure: CYSTOSCOPY WITH RETROGRADE PYELOGRAM, URETEROSCOPY;  Surgeon: Marcine Matar, MD;  Location: [facility] ORS;  Service: Urology;  Laterality: Left;  90 MINS   CYSTOSCOPY WITH RETROGRADE PYELOGRAM, URETEROSCOPY AND STENT PLACEMENT Right 09/12/2019   Procedure: CYSTOSCOPY WITH RIGHT RETROGRADE PYELOGRAM  URETEROSCOPY WITH HOLMIUM LASER STONE EXTRACTION AND STENT PLACEMENT;  Surgeon: Marcine Matar, MD;  Location: [facility] ORS;  Service: Urology;  Laterality: Right;   DILATION AND CURETTAGE OF UTERUS     HOLMIUM LASER APPLICATION Left 08/15/2019   Procedure: HOLMIUM LASER APPLICATION;  Surgeon: Marcine Matar, MD;  Location: [facility] OR[facility];  Service: Urology;  Laterality: Left;   JOINT REPLACEMENT     R knee   KIDNEY STONE SURGERY     neuro spine similator  01/2021   OVARIAN CYST REMOVAL  05/2010   right knee revision  2016   ROTATOR CUFF REPAIR Right 02/2019   SPINE SURGERY     x 3   TUBAL LIGATION     Patient Active Problem List   Diagnosis Date  Noted   Gastroesophageal reflux disease 12/18/2022   Spinal stenosis 12/18/2022   Obesity: Starting BMI 33.3 11/09/2022   BMI 30.0-30.9,adult Current BMI 30.9 11/09/2022   Hypertension, essential 07/26/2022   Class 1 obesity with serious comorbidity and body mass index (BMI) of 33.0 to 33.9 in adult 06/23/2022   Mood disorder (HCC)- with emotional eating 05/23/2022   Mixed hyperlipidemia 05/23/2022   Prediabetes 04/26/2022   Depression 04/26/2022   Allergic rhinitis 01/26/2022   Morbid obesity (HCC) 09/24/2021   Exertional chest pain 09/24/2021   S/P laparoscopic cholecystectomy 12/24/2020   RUQ abdominal pain 12/25/2019   Decreased range of knee movement 09/02/2019   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   Irritable bowel syndrome with  constipation 09/02/2019   Calculus, ureter 07/24/2019   Chronic migraine without aura, with intractable migraine, so stated, with status migrainosus 05/30/2019   Chronic asthmatic bronchitis 05/14/2019   Excessive daytime sleepiness 01/08/2019   Upper airway cough syndrome 11/12/2018   Acute bacterial sinusitis 07/17/2018   Abnormal chest CT 07/17/2018   Trigeminal neuralgia of right side of face 02/01/2018   Occipital neuralgia 02/01/2018   Chronic migraine without aura without status migrainosus, not intractable 02/01/2018   Decreased strength 05/10/2017   Family history of breast cancer    Family history of prostate cancer    Chronic left shoulder pain 09/02/2015   Carpal tunnel syndrome 08/25/2011   Asthma 04/14/2009   Essential hypertension 04/14/2009   Hyperlipidemia 04/14/2009    PCP: Woodfin Ganja, MD   REFERRING PROVIDER: Venita Lick, MD  REFERRING DIAG:  Diagnosis  M54.59 (ICD-10-CM) - Other low back pain    Rationale for Evaluation and Treatment: Rehabilitation  THERAPY DIAG:  Pain, lumbar region  Muscle weakness (generalized)  Difficulty walking  ONSET DATE: March 2024  SUBJECTIVE:                                                                                                                                                                                           SUBJECTIVE STATEMENT: Pt reports she had injection Monday after aquatic therapy.  Yesterday pain was a lot less.  This morning, pain returned, "achy".   She reports that she has been mindful of how she bends at work.  "I'm better than the first day I came in".      FROM EVAL: Pt states the L sided back pain now is new is very limiting. Waking up, she has pain bilaterally. Standing for too long significantly increases her pain. In the last 60 days, the L side has gotten worse. Pt had an injection that did not give relief. Walking too  long, sweeping, cooking/dishes, mopping, tying shoes,  long periods of sitting all cause pain. She is not able to dress LE without pain. Pt does have pain that comes down to the knee that has improved since injection. Standing for too long will cause radiating pain. Pain stops at just below the knee. Pt will also have back pain that also goes up towards her kidneys- cleared by urologist. Pt only able to stroll for about 10 mins without significant pain. Pt unable to carries groceries upstairs due to the pain at the knee and back. Previously able to lift case of water but no longer able to due to pain- part time job doing [employer].  Exercise- only able to walk for exercise, has gym access.   Previously had PT for shoulders and back at [facility]. No real strengthening exercise.    Currently, trigeminal neuralgia and occipital neuralgia flare. Had botox injections this morning and was told by MD to inform PT.   PERTINENT HISTORY:  2002 lumar micro discectomy  2007 C6-7 Fusion 2014 L5-S1 fusion  2015-2016 R knee TKA and revision  2022 lumbar nerve stimulator  PAIN:  Are you having pain? Yes: NPRS scale: 6/10 Pain location: localized to L lumbar  Pain description: achy, sharp, "rod pushing through my back," stairs Aggravating factors: any bending, lifting, standing Relieving factors: No relief  PRECAUTIONS: nerve stimulator  WEIGHT BEARING RESTRICTIONS: No  FALLS:  Has patient fallen in last 6 months? Yes. 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 At L3-4 there is a broad-based disc bulge. Severe bilateral facet arthropathy. Moderate spinal stenosis. Bilateral subarticular recess stenosis. Moderate left foraminal stenosis. Moderate-severe right foraminal stenosis. 2. At L4-5 there is a broad-based disc bulge. Moderate bilateral facet arthropathy with bilateral mild facet effusions. Severe spinal stenosis. Severe right foraminal stenosis. Mild-moderate left foraminal stenosis. 3. Posterior lumbar fusion at L5-S1 with a broad-based disc bulge. No spinal stenosis. Mild bilateral foraminal stenosis. 4. No acute osseous injury of the lumbar spine.     Fusion hardware noted at L5-S1 no abnormalities noted adjacent segment disease noted at L4-5 and L3-4.    PATIENT SURVEYS:  FOTO 32 40 @ DC 9 MCII  SCREENING FOR RED FLAGS: Bowel or bladder incontinence: No Spinal tumors: No Cauda equina syndrome: No Compression fracture: No Abdominal aneurysm: No  COGNITION: Overall cognitive status: Within functional limits for tasks assessed     SENSATION: Light touch: Impaired   POSTURE: rounded shoulders and decreased lumbar lordosis  PALPATION: TTP of L lumbar paraspinals and L QL; hypertonicity of bilat lumbar paraspinals and QL  LUMBAR ROM:   AROM eval  Flexion 60%  Extension R sided p!  Right lateral flexion 30%  Left lateral flexion 30%  Right rotation 50%  L p!  Left rotation 50%   (Blank rows = not tested)  LOWER EXTREMITY ROM:   unable to test hips due to increase in pain following lumbar AROM  Active  Right eval Left eval  Hip  flexion    Hip extension    Hip abduction    Hip adduction    Hip internal rotation    Hip external rotation    Knee flexion 87 120  Knee extension -3 2  Ankle dorsiflexion    Ankle plantarflexion    Ankle inversion    Ankle eversion     (Blank rows = not tested)  LOWER EXTREMITY MMT:   Unable to test due to increase in pain following AROM  LUMBAR SPECIAL  TESTS:  Straight leg raise test: Positive, FABER test: Positive, and Trendelenburg sign: Positive  FUNCTIONAL TESTS:  5 times sit to stand: 17.2s  GAIT: Distance walked: 20ft Assistive device utilized: None Level of assistance: Complete Independence Comments: L trunk lean, lack of TKE on R, decrease toe off on R and L  TODAY'S TREATMENT:                                                                                                                              DATE: 6/5 Pt seen for aquatic therapy today.  Treatment took place in water 3.5-4.75 ft in depth at the [facility] pool. Temp of water was 91.  Pt entered/exited the pool via stairs independently with bilat rail.  * straddling yellow noodle with yellow hand floats: cycling ; hip add/abd * unsupported submerged to chest deep water : walking forward/backward  * side stepping without/with rainbow hand floats and arm addct * L stretch with knees slightly bent * holding yellow noodle: hip abdct/addct 3 x5; Leg swings into hip flex/ext 2 x 5; heel raises 2 x10; relaxed squats x 8 * staggered stance with kick board row 2 x 10  ( 2nd set with increased speed)  * 3 way LE stretch with back against wall, ankle supported by blue noodle (with hole)   Pt requires the buoyancy and hydrostatic pressure of water for support, and to offload joints by unweighting joint load by at least 50 % in navel deep water and by at least 75-80% in chest to neck deep water.  Viscosity of the water is needed for resistance of strengthening. Water current perturbations provides challenge to standing balance requiring increased core activation.   PATIENT EDUCATION:  Education details: reacquainting to aquatic therapy  Person educated: Patient Education method: Explanation, Demonstration, Tactile cues, Verbal cues,  Education comprehension: verbalized understanding, returned demonstration, verbal cues required, tactile cues required, and needs further  education  HOME EXERCISE PROGRAM:  Access Code: [facility] URL: [email or web address] Date: 01/03/2023 Prepared by: Zebedee Iba    ASSESSMENT:  CLINICAL IMPRESSION: Started pt in deeper water to assist with decompression and relief of pain.  Cues given for even LEs with cycling revolutions, needed to straighten R knee more during down motion. Added LE stretches with good relief.   She did have slight reduction in pain upon completion to 4/10.  Progressing well towards goals.     FROM EVAL: Patient is a 62  y.o. female who was seen today for physical therapy evaluation and treatment for c/c of chronic LBP. Pt's s/s appear consistent with lumbar radiculopathy and complex presentation given long standing history of LBP, multiple surgeries, as well as several concomitant pain syndromes contributing to her chronic pain. Pt's pain is highly sensitive and irritable with movement. Pt's is more pain dominant at this time. Plan to continue with aquatic therapy and land therapy for lumbar mobility and general functional mobility/LE strength at future sessions. Plan for progressive strengthening as pt's previous PT episodes did not target resistance training. Pt would benefit from continued skilled therapy in order to reach goals and maximize functional lumbopelvic strength and ROM for prevention of further functional decline.    OBJECTIVE IMPAIRMENTS: decreased activity tolerance, decreased endurance, decreased mobility, decreased ROM, decreased strength, hypomobility, increased muscle spasms, impaired flexibility, improper body mechanics, postural dysfunction, and pain.   ACTIVITY LIMITATIONS: carrying, lifting, sleeping, transfers, dressing, and bending, squatting  PARTICIPATION LIMITATIONS: cleaning, laundry, yard work, community activity, and exercise  PERSONAL FACTORS: Age, Fitness, Time since onset of injury/illness/exacerbation, and 3+ comorbidities:  are also affecting patient's  functional outcome.   REHAB POTENTIAL: Fair  CLINICAL DECISION MAKING: unstable/complicated  EVALUATION COMPLEXITY: Moderate   SHORT TERM GOALS: Target date: 02/14/2023   Pt will become independent with HEP in order to demonstrate synthesis of PT education.   Goal status: INITIAL  2.  Pt will report at least 2 pt reduction on NPRS scale for pain in order to demonstrate functional improvement with household activity, self care, and ADL.   Goal status: INITIAL  3. Pt will score at least 9 pt increase on FOTO to demonstrate functional improvement in MCII and pt perceived function.      Goal status: INITIAL  LONG TERM GOALS: Target date: 03/28/2023    Pt  will become independent with final HEP in order to demonstrate synthesis of PT education.  Baseline:  Goal status: INITIAL  2. Pt will score >/= 40 on FOTO to demonstrate improvement in perceived lumbar function.  Baseline:  Goal status: INITIAL  3.  Pt will be able to perform 5XSTS in under 12s  in order to demonstrate functional improvement above the cut off score for adults.  Goal status: INITIAL  4.  Pt will be able to demonstrate/report ability to walk >15 mins without pain in order to demonstrate functional improvement and tolerance to exercise and community mobility.   Goal status: INITIAL PLAN:  PT FREQUENCY: 1x/week  PT DURATION: 12 weeks (likely to D/C by 8 with independent management)  PLANNED INTERVENTIONS: Therapeutic exercises, Therapeutic activity, Neuromuscular re-education, Balance training, Gait training, Patient/Family education, Self Care, Joint mobilization, Joint manipulation, Orthotic/Fit training, DME instructions, Aquatic Therapy, Dry Needling, Electrical stimulation, Spinal manipulation, Spinal mobilization, Cryotherapy, Moist heat, scar mobilization, Splintting, Taping, Vasopneumatic device, Traction, Ultrasound, Ionotophoresis 4mg /ml Dexamethasone, Manual therapy, and Re-evaluation.  PLAN FOR  NEXT SESSION: aquatic to start for lumbar mobility, pain reduction with movement, and general LE strength; progress to land based strengthening/lifting  Olsen Camel, PTA 01/11/23 8:25 AM [facility] [address] [place], [place], [address] Phone: [phone]   Fax:  [phone]

## 2023-01-12 ENCOUNTER — Ambulatory Visit (HOSPITAL_COMMUNITY): Payer: Medicare Other

## 2023-01-12 ENCOUNTER — Encounter (HOSPITAL_COMMUNITY): Payer: Self-pay

## 2023-01-12 ENCOUNTER — Ambulatory Visit (HOSPITAL_BASED_OUTPATIENT_CLINIC_OR_DEPARTMENT_OTHER): Payer: Medicare Other | Admitting: Physical Therapy

## 2023-01-12 DIAGNOSIS — M25571 Pain in right ankle and joints of right foot: Secondary | ICD-10-CM | POA: Diagnosis not present

## 2023-01-16 ENCOUNTER — Ambulatory Visit (HOSPITAL_BASED_OUTPATIENT_CLINIC_OR_DEPARTMENT_OTHER): Payer: Medicare Other | Admitting: Physical Therapy

## 2023-01-17 ENCOUNTER — Other Ambulatory Visit: Payer: Self-pay

## 2023-01-17 ENCOUNTER — Emergency Department (HOSPITAL_BASED_OUTPATIENT_CLINIC_OR_DEPARTMENT_OTHER)
Admission: EM | Admit: 2023-01-17 | Discharge: 2023-01-17 | Disposition: A | Discharge status: Home / Self Care | Payer: No Typology Code available for payment source | Attending: Emergency Medicine | Admitting: Emergency Medicine

## 2023-01-17 ENCOUNTER — Encounter (HOSPITAL_BASED_OUTPATIENT_CLINIC_OR_DEPARTMENT_OTHER): Payer: Self-pay

## 2023-01-17 ENCOUNTER — Telehealth: Payer: Self-pay | Admitting: Family Medicine

## 2023-01-17 DIAGNOSIS — M542 Cervicalgia: Secondary | ICD-10-CM | POA: Insufficient documentation

## 2023-01-17 DIAGNOSIS — Z91040 Latex allergy status: Secondary | ICD-10-CM | POA: Insufficient documentation

## 2023-01-17 DIAGNOSIS — M7918 Myalgia, other site: Secondary | ICD-10-CM

## 2023-01-17 DIAGNOSIS — M791 Myalgia, unspecified site: Secondary | ICD-10-CM | POA: Diagnosis not present

## 2023-01-17 DIAGNOSIS — Z79899 Other long term (current) drug therapy: Secondary | ICD-10-CM | POA: Insufficient documentation

## 2023-01-17 DIAGNOSIS — Y92410 Unspecified street and highway as the place of occurrence of the external cause: Secondary | ICD-10-CM | POA: Diagnosis not present

## 2023-01-17 DIAGNOSIS — Z041 Encounter for examination and observation following transport accident: Secondary | ICD-10-CM | POA: Diagnosis present

## 2023-01-17 DIAGNOSIS — M25511 Pain in right shoulder: Secondary | ICD-10-CM | POA: Diagnosis not present

## 2023-01-17 DIAGNOSIS — R519 Headache, unspecified: Secondary | ICD-10-CM | POA: Insufficient documentation

## 2023-01-17 MED ORDER — LIDOCAINE 5 % EX PTCH
1.0000 | MEDICATED_PATCH | CUTANEOUS | Status: DC
  Administered 2023-01-17: 1 via TRANSDERMAL
  Filled 2023-01-17: qty 1

## 2023-01-17 MED ORDER — METHYLPREDNISOLONE ACETATE 80 MG/ML IJ SUSP
40.0000 mg | Freq: Once | INTRAMUSCULAR | Status: AC
  Administered 2023-01-17: 40 mg via INTRAMUSCULAR
  Filled 2023-01-17: qty 1
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
## 2023-01-17 NOTE — Telephone Encounter (Signed)
Noted  

## 2023-01-17 NOTE — Discharge Instructions (Signed)
Apply warm compresses to sore muscles, gentle range of motion exercises as tolerated.  Follow-up with your care team as needed.
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
## 2023-01-17 NOTE — Telephone Encounter (Signed)
Pt said she is on her way to Urgent Care now.

## 2023-01-18 DIAGNOSIS — M47816 Spondylosis without myelopathy or radiculopathy, lumbar region: Secondary | ICD-10-CM | POA: Diagnosis not present

## 2023-01-18 DIAGNOSIS — G500 Trigeminal neuralgia: Secondary | ICD-10-CM | POA: Diagnosis not present

## 2023-01-18 DIAGNOSIS — M961 Postlaminectomy syndrome, not elsewhere classified: Secondary | ICD-10-CM | POA: Diagnosis not present

## 2023-01-18 DIAGNOSIS — G894 Chronic pain syndrome: Secondary | ICD-10-CM | POA: Diagnosis not present

## 2023-01-19 ENCOUNTER — Encounter: Payer: Self-pay | Admitting: Podiatrist

## 2023-01-19 ENCOUNTER — Ambulatory Visit (INDEPENDENT_AMBULATORY_CARE_PROVIDER_SITE_OTHER): Payer: Medicare Other | Admitting: Podiatrist

## 2023-01-19 ENCOUNTER — Ambulatory Visit (INDEPENDENT_AMBULATORY_CARE_PROVIDER_SITE_OTHER): Payer: Medicare Other

## 2023-01-19 DIAGNOSIS — M79672 Pain in left foot: Secondary | ICD-10-CM

## 2023-01-19 DIAGNOSIS — S96912A Strain of unspecified muscle and tendon at ankle and foot level, left foot, initial encounter: Secondary | ICD-10-CM

## 2023-01-19 DIAGNOSIS — S93491A Sprain of other ligament of right ankle, initial encounter: Secondary | ICD-10-CM | POA: Diagnosis not present

## 2023-01-19 DIAGNOSIS — S93499A Sprain of other ligament of unspecified ankle, initial encounter: Secondary | ICD-10-CM | POA: Diagnosis not present

## 2023-01-19 DIAGNOSIS — M79671 Pain in right foot: Secondary | ICD-10-CM | POA: Diagnosis not present

## 2023-01-19 MED ORDER — TRIAMCINOLONE ACETONIDE 10 MG/ML IJ SUSP
10.0000 mg | Freq: Once | INTRAMUSCULAR | Status: AC
  Administered 2023-01-19: 10 mg

## 2023-01-19 NOTE — Progress Notes (Signed)
Chief Complaint  Patient presents with   Foot Pain    Bilateral ankle pain     HPI: Patient is 62 y.o. female who presents today for pain on the outside of the left ankle.  She states she sustained a fall where she twisted her left ankle about 2 weeks ago she relates it improved with ice and rest however she recently had another fall where she twisted her right ankle and now her left ankle hurts again and her right ankle feels fine.  She describes the pain as across the foot and up the lateral side of the ankle and into the left leg.  She also relates the discomfort keeps her up at night.  She saw someone at [facility] who recommended lace up ankle braces which she states have been helping somewhat.   Allergies Reviewed   Review of systems is negative except as noted in the HPI.  Denies nausea/ vomiting/ fevers/ chills or night sweats.   Denies difficulty breathing, denies calf pain or tenderness  Physical Exam  Patient is awake, alert, and oriented x 3.  In no acute distress.    Vascular status is intact with palpable pedal pulses DP and PT bilateral and capillary refill time less than 3 seconds bilateral.  No edema or erythema noted.   Neurological exam reveals epicritic and protective sensation grossly intact bilateral.   Dermatological exam reveals skin is supple and dry to bilateral feet.  No open lesions present.    Musculoskeletal exam: Pain with dorsiflexion, plantarflexion, eversion and especially inversion on the left ankle is noted.  Pain along the course of the peroneus brevis tendon is noted at its insertion point on the fifth metatarsal base as well as coursing proximally into the lower leg.  Some pain along the sinus tarsi region is also identified.  Generalized discomfort along the lateral ankle is noted.  Negative anterior drawer sign is noted.  Pain with manual inversion is noted.  Right ankle some discomfort on the anterior lateral aspect of the ankle joint is noted/  ATFL.  Negative anterior drawer sign is noted.  Minimal pain with inversion is noted.  No pain medially  X-ray evaluation 3 views left ankle and AP left foot is reviewed.  No sign of fracture or dislocation is noted.  Ankle joint is in good alignment.  No increased medial clear space noted.  Normal overlap between the tibia and fibula noted.  Fifth metatarsal base is normal.  No sign of fracture or dislocation is noted.  X-ray evaluation 3 views of the right ankle are obtained.  No sign of fracture or dislocation is noted.  Good alignment and position of the ankle mortise is noted.  Tib-fib overlap is normal.  Slight decrease joint space at the anterior tibia/talus seen on lateral view.  Assessment:   ICD-10-CM   1. Sprain of anterior talofibular ligament, unspecified laterality, initial encounter  S93.499A DG Ankle Complete Left    DG Ankle Complete Right    triamcinolone acetonide (KENALOG) 10 MG/ML injection 10 mg    2. Strain of tendon of foot and ankle, left, initial encounter  S96.912A     3. Sprain of anterior talofibular ligament of right ankle, initial encounter  S93.491A         Plan: Discussed exam and x-ray findings with Henretter and recommended an injection of steroid to help calm down the pain and inflammation as well as boot immobilization for the left ankle.  Patient agreed and an injection  was performed as stated below.  A short air fracture walker was also dispensed for her use and she was given instructions on use.  She will be seen back in 1 month for follow-up with Dr. Charlsie Merles if any concerns arise prior to that visit she instructed to call.  Procedure: Injection left Discussed alternatives, risks, complications and verbal consent was obtained.  Location: left sinus tarsi  Skin Prep: Alcohol. Injectate: 1cc 0.5% marcaine plain, 1 cc 10 mg Kenalog Disposition: Patient tolerated procedure well. Injection site dressed with a band-aid.  Post-injection care was discussed and  return precautions discussed.    Left injection into sinus tarsi and boot immobilization. Return in 4 weeks for recheck

## 2023-01-19 NOTE — Patient Instructions (Signed)
Ankle Sprain, Phase I Rehab An ankle sprain is an injury to the tissues that connect bone to bone (ligaments) in your ankle. Ankle sprains can cause stiffness, loss of motion, and loss of strength. Ask your health care provider which exercises are safe for you. Do exercises exactly as told by your provider and adjust them as directed. It is normal to feel mild stretching, pulling, tightness, or discomfort as you do these exercises. Stop right away if you feel sudden pain or your pain gets worse. Do not begin these exercises until told by your provider. Stretching and range-of-motion exercises These exercises warm up your muscles and joints. They can improve the movement and flexibility of your lower leg and ankle. They also help to relieve pain and stiffness. Gastroc and soleus stretch This exercise is also called a calf stretch. It stretches the muscles in the back of the lower leg. These muscles are the gastrocnemius, or gastroc, and the soleus. Sit on the floor with your left / right leg extended. Loop a belt or towel around the ball of your left / right foot. The ball of your foot is on the walking surface, right under your toes. Keep your left / right ankle and foot relaxed and keep your knee straight. Use the belt or towel to pull your foot toward you. You should feel a gentle stretch behind your calf or knee in your gastroc muscle. Hold this position for __________ seconds, then release to the starting position. Repeat the exercise with your knee bent. You can put a pillow or a rolled bath towel under your knee to support it. You should feel a stretch deep in your calf in the soleus muscle or at your Achilles tendon. Repeat __________ times. Complete this exercise __________ times a day. Ankle alphabet  Sit with your left / right leg supported at the lower leg. Do not rest your foot on anything. Make sure your foot has room to move freely. Think of your left / right foot as a  paintbrush. Move your foot to trace each letter of the alphabet in the air. Keep your hip and knee still while you trace. Make the letters as large as you can without feeling discomfort. Trace every letter from A to Z. Repeat __________ times. Complete this exercise __________ times a day. Strengthening exercises These exercises build strength and endurance in your ankle and lower leg. Endurance is the ability to use your muscles for a long time, even after they get tired. Ankle dorsiflexion  Secure a rubber exercise band or tube to an object, such as a table leg, that will stay still when the band is pulled. Secure the other end around your left / right foot. Sit on the floor facing the object, with your left / right leg extended. The band or tube should be slightly tense when your foot is relaxed. Slowly bring your foot toward you, bringing the top of your foot toward your shin (dorsiflexion), and pulling the band tighter. Hold this position for __________ seconds. Slowly return your foot to the starting position. Repeat __________ times. Complete this exercise __________ times a day. Ankle plantar flexion  Sit on the floor with your left / right leg extended. Loop a rubber exercise tube or band around the ball of your left / right foot. The ball of your foot is on the walking surface, right under your toes. Hold the ends of the band or tube in your hands. The band or tube should be   slightly tense when your foot is relaxed. Slowly point your foot and toes downward to tilt the top of your foot away from your shin (plantar flexion). Hold this position for __________ seconds. Slowly return your foot to the starting position. Repeat __________ times. Complete this exercise __________ times a day. Ankle eversion  Sit on the floor with your legs straight out in front of you. Loop a rubber exercise band or tube around the ball of your left / right foot. The ball of your foot is on the walking  surface, right under your toes. Hold the ends of the band in your hands or secure the band to a stable object. The band or tube should be slightly tense when your foot is relaxed. Slowly push your foot outward, away from your other leg (eversion). Hold this position for __________ seconds. Slowly return your foot to the starting position. Repeat __________ times. Complete this exercise __________ times a day. This information is not intended to replace advice given to you by your health care provider. Make sure you discuss any questions you have with your health care provider. Document Revised: 05/18/2022 Document Reviewed: 05/18/2022 [employer] Patient Education  2024 [employer]  

## 2023-01-23 DIAGNOSIS — R0982 Postnasal drip: Secondary | ICD-10-CM | POA: Diagnosis not present

## 2023-01-23 DIAGNOSIS — M542 Cervicalgia: Secondary | ICD-10-CM | POA: Diagnosis not present

## 2023-01-23 DIAGNOSIS — I10 Essential (primary) hypertension: Secondary | ICD-10-CM | POA: Diagnosis not present

## 2023-01-24 ENCOUNTER — Encounter (HOSPITAL_BASED_OUTPATIENT_CLINIC_OR_DEPARTMENT_OTHER): Payer: Self-pay | Admitting: Physical Therapy

## 2023-01-24 ENCOUNTER — Ambulatory Visit (HOSPITAL_BASED_OUTPATIENT_CLINIC_OR_DEPARTMENT_OTHER): Payer: Medicare Other | Admitting: Physical Therapy

## 2023-01-24 DIAGNOSIS — M6281 Muscle weakness (generalized): Secondary | ICD-10-CM

## 2023-01-24 DIAGNOSIS — M545 Low back pain, unspecified: Secondary | ICD-10-CM

## 2023-01-24 DIAGNOSIS — R262 Difficulty in walking, not elsewhere classified: Secondary | ICD-10-CM | POA: Diagnosis not present
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
## 2023-01-26 ENCOUNTER — Ambulatory Visit (HOSPITAL_BASED_OUTPATIENT_CLINIC_OR_DEPARTMENT_OTHER): Payer: Medicare Other | Admitting: Physical Therapy

## 2023-01-26 ENCOUNTER — Encounter (HOSPITAL_BASED_OUTPATIENT_CLINIC_OR_DEPARTMENT_OTHER): Payer: Self-pay | Admitting: Physical Therapy

## 2023-01-26 DIAGNOSIS — M545 Low back pain, unspecified: Secondary | ICD-10-CM

## 2023-01-26 DIAGNOSIS — M6281 Muscle weakness (generalized): Secondary | ICD-10-CM | POA: Diagnosis not present

## 2023-01-26 DIAGNOSIS — R262 Difficulty in walking, not elsewhere classified: Secondary | ICD-10-CM | POA: Diagnosis not present
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
## 2023-01-27 ENCOUNTER — Ambulatory Visit (HOSPITAL_BASED_OUTPATIENT_CLINIC_OR_DEPARTMENT_OTHER): Payer: Self-pay | Admitting: Physical Therapy

## 2023-01-30 ENCOUNTER — Ambulatory Visit (HOSPITAL_BASED_OUTPATIENT_CLINIC_OR_DEPARTMENT_OTHER): Payer: Medicare Other | Admitting: Physical Therapy

## 2023-01-31 ENCOUNTER — Ambulatory Visit (HOSPITAL_BASED_OUTPATIENT_CLINIC_OR_DEPARTMENT_OTHER): Payer: Self-pay | Admitting: Physical Therapy

## 2023-01-31 DIAGNOSIS — E7800 Pure hypercholesterolemia, unspecified: Secondary | ICD-10-CM | POA: Diagnosis not present

## 2023-01-31 DIAGNOSIS — Z885 Allergy status to narcotic agent status: Secondary | ICD-10-CM | POA: Diagnosis not present

## 2023-01-31 DIAGNOSIS — G501 Atypical facial pain: Secondary | ICD-10-CM | POA: Diagnosis not present

## 2023-01-31 DIAGNOSIS — Z886 Allergy status to analgesic agent status: Secondary | ICD-10-CM | POA: Diagnosis not present

## 2023-01-31 DIAGNOSIS — Z888 Allergy status to other drugs, medicaments and biological substances status: Secondary | ICD-10-CM | POA: Diagnosis not present

## 2023-01-31 DIAGNOSIS — Z880 Allergy status to penicillin: Secondary | ICD-10-CM | POA: Diagnosis not present

## 2023-01-31 DIAGNOSIS — K219 Gastro-esophageal reflux disease without esophagitis: Secondary | ICD-10-CM | POA: Diagnosis not present

## 2023-01-31 DIAGNOSIS — Z9682 Presence of neurostimulator: Secondary | ICD-10-CM | POA: Diagnosis not present

## 2023-01-31 DIAGNOSIS — G500 Trigeminal neuralgia: Secondary | ICD-10-CM | POA: Diagnosis not present

## 2023-01-31 DIAGNOSIS — G894 Chronic pain syndrome: Secondary | ICD-10-CM | POA: Diagnosis not present

## 2023-01-31 DIAGNOSIS — Z91040 Latex allergy status: Secondary | ICD-10-CM | POA: Diagnosis not present

## 2023-01-31 DIAGNOSIS — J45909 Unspecified asthma, uncomplicated: Secondary | ICD-10-CM | POA: Diagnosis not present

## 2023-01-31 DIAGNOSIS — J069 Acute upper respiratory infection, unspecified: Secondary | ICD-10-CM | POA: Diagnosis not present

## 2023-02-01 ENCOUNTER — Ambulatory Visit (HOSPITAL_BASED_OUTPATIENT_CLINIC_OR_DEPARTMENT_OTHER): Payer: Medicare Other | Admitting: Physical Therapy

## 2023-02-03 ENCOUNTER — Encounter (HOSPITAL_BASED_OUTPATIENT_CLINIC_OR_DEPARTMENT_OTHER): Payer: Self-pay | Admitting: Physical Therapy

## 2023-02-03 ENCOUNTER — Ambulatory Visit (HOSPITAL_BASED_OUTPATIENT_CLINIC_OR_DEPARTMENT_OTHER): Payer: Medicare Other | Admitting: Physical Therapy

## 2023-02-03 DIAGNOSIS — M545 Low back pain, unspecified: Secondary | ICD-10-CM

## 2023-02-03 DIAGNOSIS — M6281 Muscle weakness (generalized): Secondary | ICD-10-CM | POA: Diagnosis not present

## 2023-02-03 DIAGNOSIS — R262 Difficulty in walking, not elsewhere classified: Secondary | ICD-10-CM | POA: Diagnosis not present

## 2023-02-03 DIAGNOSIS — M47816 Spondylosis without myelopathy or radiculopathy, lumbar region: Secondary | ICD-10-CM | POA: Diagnosis not present

## 2023-02-03 NOTE — Therapy (Signed)
OUTPATIENT PHYSICAL THERAPY THORACOLUMBAR TREATMENT   Patient Name: Cassidy Olsen MRN: [id number] DOB:04/02/1961, 62 y.o., female Today's Date: 02/03/2023  END OF SESSION:  PT End of Session - 02/03/23 1058     Visit Number 7    Number of Visits 16    Date for PT Re-Evaluation 04/03/23    Authorization Type MCR    PT Start Time 0830   late arrival   PT Stop Time 0901    PT Time Calculation (min) 31 min    Activity Tolerance Patient tolerated treatment well;Other (comment)   adjsuted activity due to fall coming to session   Behavior During Therapy [facility] for tasks assessed/performed             Past Medical History:  Diagnosis Date   ADHD    Arthritis 2010   Asthma    Back pain    Chronic headaches    Chronic pain    Constipation    Edema of both lower extremities    Family history of breast cancer    Family history of prostate cancer    Gallstones 2018   GERD (gastroesophageal reflux disease)    High cholesterol    History of kidney stones    Hx: UTI (urinary tract infection)    Hypertension    IBS (irritable bowel syndrome)    Interstitial cystitis 2012   Joint pain    Kidney problem    Migraine    Occipital neuralgia    Osteoarthritis    PNA (pneumonia) 2018   Pre-diabetes    Spinal stenosis    Trigeminal neuralgia    Past Surgical History:  Procedure Laterality Date   ABDOMINAL HYSTERECTOMY     ANKLE SURGERY Left 05/2009   APPENDECTOMY     Benign Tumor Removal Right 01/26/2017   right upper arm by Dr. Benard Rink at [facility] [place] [place]   BRAIN SURGERY     CARPAL TUNNEL RELEASE Right 2014   CHOLECYSTECTOMY N/A 12/24/2020   Procedure: LAPAROSCOPIC CHOLECYSTECTOMY;  Surgeon: Abigail Miyamoto, MD;  Location: [facility] OR;  Service: General;  Laterality: N/A;   COLONOSCOPY     CYSTOSCOPY W/ URETERAL STENT PLACEMENT Left 07/25/2019   Procedure: CYSTOSCOPY WITH RETROGRADE PYELOGRAM/URETERAL STENT PLACEMENT;  Surgeon: Marcine Matar, MD;  Location: [facility]  ORS;  Service: Urology;  Laterality: Left;   CYSTOSCOPY W/ URETERAL STENT REMOVAL Left 08/15/2019   Procedure: CYSTOSCOPY WITH STENT REMOVAL;  Surgeon: Marcine Matar, MD;  Location: [facility] ORS;  Service: Urology;  Laterality: Left;   CYSTOSCOPY WITH RETROGRADE PYELOGRAM, URETEROSCOPY AND STENT PLACEMENT Left 08/15/2019   Procedure: CYSTOSCOPY WITH RETROGRADE PYELOGRAM, URETEROSCOPY;  Surgeon: Marcine Matar, MD;  Location: [facility] ORS;  Service: Urology;  Laterality: Left;  90 MINS   CYSTOSCOPY WITH RETROGRADE PYELOGRAM, URETEROSCOPY AND STENT PLACEMENT Right 09/12/2019   Procedure: CYSTOSCOPY WITH RIGHT RETROGRADE PYELOGRAM  URETEROSCOPY WITH HOLMIUM LASER STONE EXTRACTION AND STENT PLACEMENT;  Surgeon: Marcine Matar, MD;  Location: [facility] ORS;  Service: Urology;  Laterality: Right;   DILATION AND CURETTAGE OF UTERUS     HOLMIUM LASER APPLICATION Left 08/15/2019   Procedure: HOLMIUM LASER APPLICATION;  Surgeon: Marcine Matar, MD;  Location: [facility] ORS;  Service: Urology;  Laterality: Left;   JOINT REPLACEMENT     R knee   KIDNEY STONE SURGERY     neuro spine similator  01/2021   OVARIAN CYST REMOVAL  05/2010   right knee revision  2016   ROTATOR CUFF REPAIR Right 02/2019   SPINE SURGERY  x 3   TUBAL LIGATION     Patient Active Problem List   Diagnosis Date Noted   Gastroesophageal reflux disease 12/18/2022   Spinal stenosis 12/18/2022   Obesity: Starting BMI 33.3 11/09/2022   BMI 30.0-30.9,adult Current BMI 30.9 11/09/2022   Hypertension, essential 07/26/2022   Class 1 obesity with serious comorbidity and body mass index (BMI) of 33.0 to 33.9 in adult 06/23/2022   Mood disorder (HCC)- with emotional eating 05/23/2022   Mixed hyperlipidemia 05/23/2022   Prediabetes 04/26/2022   Depression 04/26/2022   Allergic rhinitis 01/26/2022   Morbid obesity (HCC) 09/24/2021   Exertional chest pain 09/24/2021   S/P laparoscopic cholecystectomy 12/24/2020   RUQ abdominal pain 12/25/2019    Decreased range of knee movement 09/02/2019   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   Irritable bowel syndrome with constipation 09/02/2019   Calculus, ureter 07/24/2019   Chronic migraine without aura, with intractable migraine, so stated, with status migrainosus 05/30/2019   Chronic asthmatic bronchitis 05/14/2019   Excessive daytime sleepiness 01/08/2019   Upper airway cough syndrome 11/12/2018   Acute bacterial sinusitis 07/17/2018   Abnormal chest CT 07/17/2018   Trigeminal neuralgia of right side of face 02/01/2018   Occipital neuralgia 02/01/2018   Chronic migraine without aura without status migrainosus, not intractable 02/01/2018   Decreased strength 05/10/2017   Family history of breast cancer    Family history of prostate cancer    Chronic left shoulder pain 09/02/2015   Carpal tunnel syndrome 08/25/2011   Asthma 04/14/2009   Essential hypertension 04/14/2009   Hyperlipidemia 04/14/2009    PCP: Woodfin Ganja, MD   REFERRING PROVIDER: Venita Lick, MD  REFERRING DIAG:  Diagnosis  M54.59 (ICD-10-CM) - Other low back pain    Rationale for Evaluation and Treatment: Rehabilitation  THERAPY DIAG:  Pain, lumbar region  Muscle weakness (generalized)  Difficulty walking  ONSET DATE: March 2024  SUBJECTIVE:                                                                                                                                                                                           SUBJECTIVE STATEMENT: [facility].  She was assisted up from passerby. She reports braking her fall with the heel of her hands and on her right knee.  Reports no increase of pain at this time.  She is transported to setting in [facility].  PERTINENT HISTORY:  2002 lumar micro discectomy  2007 C6-7 Fusion 2014 L5-S1 fusion  2015-2016 R knee TKA and revision  2022 lumbar nerve stimulator  PAIN:  Are you having pain? Yes:  NPRS  scale: 5/10 Pain location: localized to L lumbar  Pain description: achy, sharp, "rod pushing through my back," stairs Aggravating factors: any bending, lifting, standing Relieving factors: No relief  PRECAUTIONS: nerve stimulator  WEIGHT BEARING RESTRICTIONS: No  FALLS:  Has patient fallen in last 6 months? Yes. 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 At L3-4 there is a broad-based disc bulge. Severe bilateral facet arthropathy. Moderate spinal stenosis. Bilateral subarticular recess stenosis. Moderate left foraminal stenosis. Moderate-severe right foraminal stenosis. 2. At L4-5 there is a broad-based disc bulge. Moderate bilateral facet arthropathy with bilateral mild facet effusions. Severe spinal stenosis. Severe right foraminal stenosis. Mild-moderate left foraminal stenosis. 3. Posterior lumbar fusion at L5-S1 with a broad-based disc bulge. No spinal stenosis. Mild bilateral foraminal stenosis. 4. No acute osseous injury of the lumbar spine.     Fusion hardware noted at L5-S1 no abnormalities noted adjacent segment disease noted at L4-5 and L3-4.    PATIENT SURVEYS:  FOTO 32 40 @ DC 9 MCII  01/26/23:  FOTO 47  SCREENING FOR RED FLAGS: Bowel or bladder incontinence: No Spinal tumors: No Cauda equina syndrome: No Compression fracture: No Abdominal aneurysm: No  COGNITION: Overall cognitive status: Within functional limits for tasks assessed     SENSATION: Light touch:  Impaired   POSTURE: rounded shoulders and decreased lumbar lordosis  PALPATION: TTP of L lumbar paraspinals and L QL; hypertonicity of bilat lumbar paraspinals and QL  LUMBAR ROM:   AROM eval  Flexion 60%  Extension R sided p!  Right lateral flexion 30%  Left lateral flexion 30%  Right rotation 50%  L p!  Left rotation 50%   (Blank rows = not tested)  LOWER EXTREMITY ROM:   unable to test hips due to increase in pain following lumbar AROM  Active  Right eval Left eval  Hip flexion    Hip extension    Hip abduction    Hip adduction    Hip internal rotation    Hip external rotation    Knee flexion 87 120  Knee extension -3 2  Ankle dorsiflexion    Ankle plantarflexion    Ankle inversion    Ankle eversion     (Blank rows = not tested)  LOWER EXTREMITY MMT:   Unable to test due to increase in pain following AROM  LUMBAR SPECIAL TESTS:  Straight leg raise test: Positive, FABER test: Positive, and Trendelenburg sign: Positive  FUNCTIONAL TESTS:  5 times sit to stand: 17.2s 01/26/23: 5x STS at bench  13.9s   GAIT: Distance walked: 43ft Assistive device utilized: None Level of assistance: Complete Independence Comments: L trunk lean, lack of TKE on R, decrease toe off on R and L  TODAY'S TREATMENT:  DATE: 6/20 Pt seen for aquatic therapy today.  Treatment took place in water 3.5-4.75 ft in depth at the [facility] pool. Temp of water was 91.  Pt entered/exited the pool via stairs independently with bilat rail.   * unsupported submerged to chest deep water : walking forward/backward with reciprocal arm swing and cues to allow Rt knee to flex during swing through * straddling yellow noodle with and without yellow hand floats (for breast strike arms): cycling ; hip add/abd; cross country ski LEs  *Bow and arrow R/L x10 alternating,  improved execution * side stepping arm addct x 2 widths (limited tolerance in RUE). Cues for abdominal bracing *TrA sets: solid noodle pull down wide stance and staggered, cues for core engagement. *Walking forward and back between exercises for recovery  Pt requires the buoyancy and hydrostatic pressure of water for support, and to offload joints by unweighting joint load by at least 50 % in navel deep water and by at least 75-80% in chest to neck deep water.  Viscosity of the water is needed for resistance of strengthening. Water current perturbations provides challenge to standing balance requiring increased core activation.   PATIENT EDUCATION:  Education details: aquatic therapy progressions and modifications   Person educated: Patient Education method: Explanation, Demonstration, Tactile cues, Verbal cues,  Education comprehension: verbalized understanding, returned demonstration, verbal cues required, tactile cues required, and needs further education  HOME EXERCISE PROGRAM:  Access Code: [facility] URL: [email or web address] Date: 01/03/2023 Prepared by: Zebedee Iba    ASSESSMENT:  CLINICAL IMPRESSION: See subjective. Initial screen after fall she appears unhurt.  Soreness slightly surface of right knee with slight scuff mark, left palm without evident injury.  She wants to get into pool for gentle movement. Therapist agrees stay off potential edema knee (hydrostatic pressure) and encourage movement.  She tolerates reduced intensity session well. After session pt reports some discomfort in right wrist at styloid process area and will have xrayed later if she feel necessary.   Pt will be out of town from 6/29-7/13.  She will not have access to pool when she is away. Pt interested in receiving HEP for land to complete while away for 2 wks.      FROM EVAL: Patient is a 62 y.o. female who was seen today for physical therapy evaluation and treatment for c/c of  chronic LBP. Pt's s/s appear consistent with lumbar radiculopathy and complex presentation given long standing history of LBP, multiple surgeries, as well as several concomitant pain syndromes contributing to her chronic pain. Pt's pain is highly sensitive and irritable with movement. Pt's is more pain dominant at this time. Plan to continue with aquatic therapy and land therapy for lumbar mobility and general functional mobility/LE strength at future sessions. Plan for progressive strengthening as pt's previous PT episodes did not target resistance training. Pt would benefit from continued skilled therapy in order to reach goals and maximize functional lumbopelvic strength and ROM for prevention of further functional decline.    OBJECTIVE IMPAIRMENTS: decreased activity tolerance, decreased endurance, decreased mobility, decreased ROM, decreased strength, hypomobility, increased muscle spasms, impaired flexibility, improper body mechanics, postural dysfunction, and pain.   ACTIVITY LIMITATIONS: carrying, lifting, sleeping, transfers, dressing, and bending, squatting  PARTICIPATION LIMITATIONS: cleaning, laundry, yard work, community activity, and exercise  PERSONAL FACTORS: Age, Fitness, Time since onset of injury/illness/exacerbation, and 3+ comorbidities:  are also affecting patient's functional outcome.   REHAB POTENTIAL: Fair  CLINICAL DECISION MAKING: unstable/complicated  EVALUATION COMPLEXITY: Moderate   SHORT  TERM GOALS: Target date: 02/14/2023   Pt will become independent with HEP in order to demonstrate synthesis of PT education.   Goal status: INITIAL  2.  Pt will report at least 2 pt reduction on NPRS scale for pain in order to demonstrate functional improvement with household activity, self care, and ADL.   Goal status: INITIAL  3. Pt will score at least 9 pt increase on FOTO to demonstrate functional improvement in MCII and pt perceived function.      Goal status:MET -  01/26/23  LONG TERM GOALS: Target date: 03/28/2023    Pt  will become independent with final HEP in order to demonstrate synthesis of PT education.  Baseline:  Goal status: INITIAL  2. Pt will score >/= 40 on FOTO to demonstrate improvement in perceived lumbar function.  Baseline:  Goal status: MET- 01/26/23  3.  Pt will be able to perform 5XSTS in under 12s  in order to demonstrate functional improvement above the cut off score for adults.  Goal status: IN PROGRESS  -01/26/23  4.  Pt will be able to demonstrate/report ability to walk >15 mins without pain in order to demonstrate functional improvement and tolerance to exercise and community mobility.   Goal status: INITIAL PLAN:  PT FREQUENCY: 1x/week  PT DURATION: 12 weeks (likely to D/C by 8 with independent management)  PLANNED INTERVENTIONS: Therapeutic exercises, Therapeutic activity, Neuromuscular re-education, Balance training, Gait training, Patient/Family education, Self Care, Joint mobilization, Joint manipulation, Orthotic/Fit training, DME instructions, Aquatic Therapy, Dry Needling, Electrical stimulation, Spinal manipulation, Spinal mobilization, Cryotherapy, Moist heat, scar mobilization, Splintting, Taping, Vasopneumatic device, Traction, Ultrasound, Ionotophoresis 4mg /ml Dexamethasone, Manual therapy, and Re-evaluation.  PLAN FOR NEXT SESSION: aquatic to start for lumbar mobility, pain reduction with movement, and general LE strength; progress to land based strengthening/lifting  Corrie Dandy Tomma Lightning) Zyrus Hetland MPT 02/03/23 10:59 AM [facility] [address] [place], [place], [address] Phone: [phone]   Fax:  [phone]

## 2023-02-08 ENCOUNTER — Encounter: Payer: Self-pay | Admitting: Family Medicine

## 2023-02-08 ENCOUNTER — Encounter (HOSPITAL_BASED_OUTPATIENT_CLINIC_OR_DEPARTMENT_OTHER): Payer: Self-pay | Admitting: Physical Therapy

## 2023-02-08 NOTE — Telephone Encounter (Signed)
Noted  

## 2023-02-15 ENCOUNTER — Ambulatory Visit (HOSPITAL_BASED_OUTPATIENT_CLINIC_OR_DEPARTMENT_OTHER): Payer: Medicare Other | Admitting: Physical Therapy

## 2023-02-15 ENCOUNTER — Ambulatory Visit: Payer: BC Managed Care – PPO | Admitting: Family Medicine

## 2023-02-17 ENCOUNTER — Ambulatory Visit (HOSPITAL_BASED_OUTPATIENT_CLINIC_OR_DEPARTMENT_OTHER): Payer: Medicare Other | Admitting: Physical Therapy

## 2023-02-20 DIAGNOSIS — G500 Trigeminal neuralgia: Secondary | ICD-10-CM | POA: Diagnosis not present

## 2023-02-20 DIAGNOSIS — G894 Chronic pain syndrome: Secondary | ICD-10-CM | POA: Diagnosis not present

## 2023-02-20 DIAGNOSIS — M47816 Spondylosis without myelopathy or radiculopathy, lumbar region: Secondary | ICD-10-CM | POA: Diagnosis not present

## 2023-02-21 ENCOUNTER — Encounter (HOSPITAL_BASED_OUTPATIENT_CLINIC_OR_DEPARTMENT_OTHER): Payer: Self-pay

## 2023-02-21 ENCOUNTER — Ambulatory Visit (HOSPITAL_BASED_OUTPATIENT_CLINIC_OR_DEPARTMENT_OTHER): Payer: Medicare Other | Attending: Orthopedic Surgery | Admitting: Physical Therapy

## 2023-02-21 DIAGNOSIS — M6281 Muscle weakness (generalized): Secondary | ICD-10-CM | POA: Insufficient documentation

## 2023-02-21 DIAGNOSIS — M545 Low back pain, unspecified: Secondary | ICD-10-CM | POA: Insufficient documentation

## 2023-02-21 DIAGNOSIS — R262 Difficulty in walking, not elsewhere classified: Secondary | ICD-10-CM | POA: Insufficient documentation

## 2023-02-22 ENCOUNTER — Ambulatory Visit: Payer: Medicare Other | Admitting: Podiatry

## 2023-02-22 ENCOUNTER — Encounter: Payer: Self-pay | Admitting: Podiatry

## 2023-02-22 DIAGNOSIS — M7672 Peroneal tendinitis, left leg: Secondary | ICD-10-CM | POA: Diagnosis not present

## 2023-02-22 NOTE — Progress Notes (Signed)
Subjective:   Patient ID: Cassidy Olsen, female   DOB: 62 y.o.   MRN: [id number]   HPI Patient states still having pain in her left ankle but states the ankle is not as bad it is now more on the side   ROS      Objective:  Physical Exam  Neurovascular status intact inflammation of the peroneal group lateral side left     Assessment:  Peroneal tendinitis left with inflammation     Plan:  H&P reviewed careful steroid injection administered to the sheath of peroneal explaining risk and if symptoms persist may require MRI continue boot usage for the next several weeks

## 2023-02-23 ENCOUNTER — Encounter (HOSPITAL_BASED_OUTPATIENT_CLINIC_OR_DEPARTMENT_OTHER): Payer: Self-pay | Admitting: Physical Therapy

## 2023-02-23 ENCOUNTER — Ambulatory Visit (HOSPITAL_BASED_OUTPATIENT_CLINIC_OR_DEPARTMENT_OTHER): Payer: Medicare Other | Admitting: Physical Therapy

## 2023-02-23 DIAGNOSIS — R262 Difficulty in walking, not elsewhere classified: Secondary | ICD-10-CM

## 2023-02-23 DIAGNOSIS — M545 Low back pain, unspecified: Secondary | ICD-10-CM

## 2023-02-23 DIAGNOSIS — M6281 Muscle weakness (generalized): Secondary | ICD-10-CM | POA: Diagnosis not present

## 2023-02-23 NOTE — Therapy (Addendum)
OUTPATIENT PHYSICAL THERAPY THORACOLUMBAR TREATMENT  Progress Note Reporting Period 01/03/23 to 02/23/23  See note below for Objective Data and Assessment of Progress/Goals.         Patient Name: Cassidy Olsen MRN: [id number] DOB:02/19/1961, 62 y.o., female Today's Date: 02/23/2023  END OF SESSION:  PT End of Session - 02/23/23 1026     Visit Number 8    Number of Visits 16    Date for PT Re-Evaluation 04/03/23    Authorization Type MCR    PT Start Time 1015    PT Stop Time 1055    PT Time Calculation (min) 40 min    Activity Tolerance Patient tolerated treatment well;Other (comment)   adjsuted activity due to fall coming to session   Behavior During Therapy [facility] for tasks assessed/performed              Past Medical History:  Diagnosis Date   ADHD    Arthritis 2010   Asthma    Back pain    Chronic headaches    Chronic pain    Constipation    Edema of both lower extremities    Family history of breast cancer    Family history of prostate cancer    Gallstones 2018   GERD (gastroesophageal reflux disease)    High cholesterol    History of kidney stones    Hx: UTI (urinary tract infection)    Hypertension    IBS (irritable bowel syndrome)    Interstitial cystitis 2012   Joint pain    Kidney problem    Migraine    Occipital neuralgia    Osteoarthritis    PNA (pneumonia) 2018   Pre-diabetes    Spinal stenosis    Trigeminal neuralgia    Past Surgical History:  Procedure Laterality Date   ABDOMINAL HYSTERECTOMY     ANKLE SURGERY Left 05/2009   APPENDECTOMY     Benign Tumor Removal Right 01/26/2017   right upper arm by Dr. Benard Rink at [facility] [place]   BRAIN SURGERY     CARPAL TUNNEL RELEASE Right 2014   CHOLECYSTECTOMY N/A 12/24/2020   Procedure: LAPAROSCOPIC CHOLECYSTECTOMY;  Surgeon: Abigail Miyamoto, MD;  Location: [facility] OR;  Service: General;  Laterality: N/A;   COLONOSCOPY     CYSTOSCOPY W/ URETERAL STENT PLACEMENT Left 07/25/2019    Procedure: CYSTOSCOPY WITH RETROGRADE PYELOGRAM/URETERAL STENT PLACEMENT;  Surgeon: Marcine Matar, MD;  Location: [facility] ORS;  Service: Urology;  Laterality: Left;   CYSTOSCOPY W/ URETERAL STENT REMOVAL Left 08/15/2019   Procedure: CYSTOSCOPY WITH STENT REMOVAL;  Surgeon: Marcine Matar, MD;  Location: [facility];  Service: Urology;  Laterality: Left;   CYSTOSCOPY WITH RETROGRADE PYELOGRAM, URETEROSCOPY AND STENT PLACEMENT Left 08/15/2019   Procedure: CYSTOSCOPY WITH RETROGRADE PYELOGRAM, URETEROSCOPY;  Surgeon: Marcine Matar, MD;  Location: [facility];  Service: Urology;  Laterality: Left;  90 MINS   CYSTOSCOPY WITH RETROGRADE PYELOGRAM, URETEROSCOPY AND STENT PLACEMENT Right 09/12/2019   Procedure: CYSTOSCOPY WITH RIGHT RETROGRADE PYELOGRAM  URETEROSCOPY WITH HOLMIUM LASER STONE EXTRACTION AND STENT PLACEMENT;  Surgeon: Marcine Matar, MD;  Location: [facility];  Service: Urology;  Laterality: Right;   DILATION AND CURETTAGE OF UTERUS     HOLMIUM LASER APPLICATION Left 08/15/2019   Procedure: HOLMIUM LASER APPLICATION;  Surgeon: Marcine Matar, MD;  Location: [facility];  Service: Urology;  Laterality: Left;   JOINT REPLACEMENT     R knee   KIDNEY STONE SURGERY     neuro spine similator  01/2021  OVARIAN CYST REMOVAL  05/2010   right knee revision  2016   ROTATOR CUFF REPAIR Right 02/2019   SPINE SURGERY     x 3   TUBAL LIGATION     Patient Active Problem List   Diagnosis Date Noted   Gastroesophageal reflux disease 12/18/2022   Spinal stenosis 12/18/2022   Obesity: Starting BMI 33.3 11/09/2022   BMI 30.0-30.9,adult Current BMI 30.9 11/09/2022   Hypertension, essential 07/26/2022   Class 1 obesity with serious comorbidity and body mass index (BMI) of 33.0 to 33.9 in adult 06/23/2022   Mood disorder (HCC)- with emotional eating 05/23/2022   Mixed hyperlipidemia 05/23/2022   Prediabetes 04/26/2022   Depression 04/26/2022   Allergic rhinitis 01/26/2022   Morbid obesity (HCC)  09/24/2021   Exertional chest pain 09/24/2021   S/P laparoscopic cholecystectomy 12/24/2020   RUQ abdominal pain 12/25/2019   Decreased range of knee movement 09/02/2019   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   Irritable bowel syndrome with constipation 09/02/2019   Calculus, ureter 07/24/2019   Chronic migraine without aura, with intractable migraine, so stated, with status migrainosus 05/30/2019   Chronic asthmatic bronchitis 05/14/2019   Excessive daytime sleepiness 01/08/2019   Upper airway cough syndrome 11/12/2018   Acute bacterial sinusitis 07/17/2018   Abnormal chest CT 07/17/2018   Trigeminal neuralgia of right side of face 02/01/2018   Occipital neuralgia 02/01/2018   Chronic migraine without aura without status migrainosus, not intractable 02/01/2018   Decreased strength 05/10/2017   Family history of breast cancer    Family history of prostate cancer    Chronic left shoulder pain 09/02/2015   Carpal tunnel syndrome 08/25/2011   Asthma 04/14/2009   Essential hypertension 04/14/2009   Hyperlipidemia 04/14/2009    PCP: Woodfin Ganja, MD   REFERRING PROVIDER: Venita Lick, MD  REFERRING DIAG:  Diagnosis  M54.59 (ICD-10-CM) - Other low back pain    Rationale for Evaluation and Treatment: Rehabilitation  THERAPY DIAG:  Pain, lumbar region  Muscle weakness (generalized)  Difficulty walking  ONSET DATE: March 2024  SUBJECTIVE:                                                                                                                                                                                           SUBJECTIVE STATEMENT: Pt states the back feels better but does continue to have pain and difficulty with lifting. Pt had RFA recently and ablation scheduled for 8/14 for L4. Pt is in the CAM boot for the L foot. She has seen podiatrist and is in boot for the next 7 wks. Pt believes that aquatics has helped a lot.  Pt is seeing chiro for her neck  and is interested in PT for the neck at some point. 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   PERTINENT HISTORY:  2002 lumar micro discectomy  2007 C6-7 Fusion 2014 L5-S1 fusion  2015-2016 R knee TKA and revision  2022 lumbar nerve stimulator  PAIN:  Are you having pain? Yes: NPRS scale: 4/10 Pain location: localized to L lumbar  Pain description: achy, sharp, "rod pushing through my back," stairs Aggravating factors: any bending, lifting, standing Relieving factors: No relief  PRECAUTIONS: nerve stimulator  WEIGHT BEARING RESTRICTIONS: No  FALLS:  Has patient fallen in last 6 months? Yes. 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 At L3-4 there is a broad-based disc bulge. Severe bilateral facet arthropathy. Moderate spinal stenosis. Bilateral subarticular recess stenosis. Moderate left foraminal stenosis. Moderate-severe right foraminal stenosis. 2. At L4-5 there is a broad-based disc bulge. Moderate bilateral facet arthropathy with bilateral mild facet effusions. Severe spinal stenosis. Severe right foraminal stenosis. Mild-moderate left foraminal stenosis. 3. Posterior lumbar fusion at L5-S1 with a broad-based disc bulge. No spinal stenosis. Mild bilateral foraminal stenosis. 4. No acute osseous injury of the lumbar spine.   Fusion hardware noted at L5-S1 no abnormalities noted adjacent segment disease noted at L4-5 and L3-4.    PATIENT SURVEYS:  FOTO 32 40 @ DC 9  MCII  01/26/23:  FOTO 47 7/18 52 FOTO GOAL MET  SCREENING FOR RED FLAGS: Bowel or bladder incontinence: No Spinal tumors: No Cauda equina syndrome: No Compression fracture: No Abdominal aneurysm: No  COGNITION: Overall cognitive status: Within functional limits for tasks assessed     SENSATION: Light touch: Impaired   POSTURE: rounded shoulders and decreased lumbar lordosis  PALPATION: TTP of L lumbar paraspinals and L QL; hypertonicity of bilat lumbar paraspinals and QL  LUMBAR ROM:   AROM 7/18  Flexion 60% p! On L   Extension 50% R sided p!  Right lateral flexion 50% p!  Left lateral flexion 50%  Right rotation 75%  L p!  Left rotation 60%   (Blank rows = not tested)   LOWER EXTREMITY MMT:  4/5 throughout with mild pain into low back   LUMBAR SPECIAL TESTS:  Straight leg raise test: Positive, FABER test: Positive, and Trendelenburg sign: Positive  FUNCTIONAL TESTS:  5 times sit to stand: 17.2s 01/26/23: 5x STS at bench  13.9s  02/23/23: 5x STS at bench  12.5s    GAIT: Distance walked: 32ft Assistive device utilized: None Level of assistance: Complete Independence Comments: L antalgic gait due to booka  TODAY'S TREATMENT:  DATE:   7/18  Supine piriformis stretch 30s 3x   Bridge with RTB at knees 4x8  Standing SB 2x10 small ROM YTB paloff press 2x10 each way  Edu about exam findings, progression of exercise, POC, HEP   6/20 Pt seen for aquatic therapy today.  Treatment took place in water 3.5-4.75 ft in depth at the [facility] pool. Temp of water was 91.  Pt entered/exited the pool via stairs independently with bilat rail.   * unsupported submerged to chest deep water : walking forward/backward with reciprocal arm swing and cues to allow Rt knee to flex during swing through * straddling yellow noodle with and  without yellow hand floats (for breast strike arms): cycling ; hip add/abd; cross country ski LEs  *Bow and arrow R/L x10 alternating, improved execution * side stepping arm addct x 2 widths (limited tolerance in RUE). Cues for abdominal bracing *TrA sets: solid noodle pull down wide stance and staggered, cues for core engagement. *Walking forward and back between exercises for recovery  Pt requires the buoyancy and hydrostatic pressure of water for support, and to offload joints by unweighting joint load by at least 50 % in navel deep water and by at least 75-80% in chest to neck deep water.  Viscosity of the water is needed for resistance of strengthening. Water current perturbations provides challenge to standing balance requiring increased core activation.   PATIENT EDUCATION:  Education details: aquatic therapy progressions and modifications   Person educated: Patient Education method: Explanation, Demonstration, Tactile cues, Verbal cues,  Education comprehension: verbalized understanding, returned demonstration, verbal cues required, tactile cues required, and needs further education  HOME EXERCISE PROGRAM:  Access Code: [facility] URL: [email or web address] Date: 01/03/2023 Prepared by: Zebedee Iba    ASSESSMENT:  CLINICAL IMPRESSION: Patient has improved with objective measures as well as subjective as demonstrated by range of motion, strength and Foto outcome measure.  Patient has exceeded expectations for Foto outcome measure at this time for the lumbar spine.  However, patient is still greatly limited with functional mobility and still continues to have severe back pain with ADL such as mopping and sweeping.  Patient is able to stand longer for cooking tasks but is still limited to walking for 15 minutes or less.  Given patient's continued deficits and high sensitivity with lumbar pain, plan to continue with alternating aquatic and land-based sessions.  Plan for  patient's next land-based session to be reevaluation with ankle as she will be out of the boot at that time and we will progress her left ankle strengthening. Pt would benefit from continued skilled therapy in order to reach goals and maximize functional lumbopelvic strength and ROM for prevention of further functional decline.    FROM EVAL: Patient is a 62 y.o. female who was seen today for physical therapy evaluation and treatment for c/c of chronic LBP. Pt's s/s appear consistent with lumbar radiculopathy and complex presentation given long standing history of LBP, multiple surgeries, as well as several concomitant pain syndromes contributing to her chronic pain. Pt's pain is highly sensitive and irritable with movement. Pt's is more pain dominant at this time. Plan to continue with aquatic therapy and land therapy for lumbar mobility and general functional mobility/LE strength at future sessions. Plan for progressive strengthening as pt's previous PT episodes did not target resistance training. Pt would benefit from continued skilled therapy in order to reach goals and maximize functional lumbopelvic strength and ROM for prevention of further functional decline.  OBJECTIVE IMPAIRMENTS: decreased activity tolerance, decreased endurance, decreased mobility, decreased ROM, decreased strength, hypomobility, increased muscle spasms, impaired flexibility, improper body mechanics, postural dysfunction, and pain.   ACTIVITY LIMITATIONS: carrying, lifting, sleeping, transfers, dressing, and bending, squatting  PARTICIPATION LIMITATIONS: cleaning, laundry, yard work, community activity, and exercise  PERSONAL FACTORS: Age, Fitness, Time since onset of injury/illness/exacerbation, and 3+ comorbidities:  are also affecting patient's functional outcome.   REHAB POTENTIAL: Fair  CLINICAL DECISION MAKING: unstable/complicated  EVALUATION COMPLEXITY: Moderate   SHORT TERM GOALS: Target date:  02/14/2023   Pt will become independent with HEP in order to demonstrate synthesis of PT education.   Goal status: INITIAL  2.  Pt will report at least 2 pt reduction on NPRS scale for pain in order to demonstrate functional improvement with household activity, self care, and ADL.   Goal status: INITIAL  3. Pt will score at least 9 pt increase on FOTO to demonstrate functional improvement in MCII and pt perceived function.      Goal status:MET - 01/26/23  LONG TERM GOALS: Target date: 03/28/2023    Pt  will become independent with final HEP in order to demonstrate synthesis of PT education.  Baseline:  Goal status: INITIAL  2. Pt will score >/= 40 on FOTO to demonstrate improvement in perceived lumbar function.  Baseline:  Goal status: MET- 01/26/23  3.  Pt will be able to perform 5XSTS in under 12s  in order to demonstrate functional improvement above the cut off score for adults.  Goal status: IN PROGRESS  -01/26/23  4.  Pt will be able to demonstrate/report ability to walk >15 mins without pain in order to demonstrate functional improvement and tolerance to exercise and community mobility.   Goal status: Ongoing PLAN:  PT FREQUENCY: 1-2x/week  PT DURATION: 12 weeks (likely to D/C by 8 with independent management)  PLANNED INTERVENTIONS: Therapeutic exercises, Therapeutic activity, Neuromuscular re-education, Balance training, Gait training, Patient/Family education, Self Care, Joint mobilization, Joint manipulation, Orthotic/Fit training, DME instructions, Aquatic Therapy, Dry Needling, Electrical stimulation, Spinal manipulation, Spinal mobilization, Cryotherapy, Moist heat, scar mobilization, Splintting, Taping, Vasopneumatic device, Traction, Ultrasound, Ionotophoresis 4mg /ml Dexamethasone, Manual therapy, and Re-evaluation.  PLAN FOR NEXT SESSION: aquatic to start for lumbar mobility, pain reduction with movement, and general LE strength; progress to land based  strengthening/lifting  Zebedee Iba PT, DPT 02/23/23 11:01 AM

## 2023-02-24 ENCOUNTER — Ambulatory Visit (HOSPITAL_BASED_OUTPATIENT_CLINIC_OR_DEPARTMENT_OTHER): Payer: Medicare Other | Admitting: Physical Therapy

## 2023-02-24 ENCOUNTER — Encounter (HOSPITAL_BASED_OUTPATIENT_CLINIC_OR_DEPARTMENT_OTHER): Payer: Self-pay | Admitting: Physical Therapy

## 2023-02-24 DIAGNOSIS — M6281 Muscle weakness (generalized): Secondary | ICD-10-CM | POA: Diagnosis not present

## 2023-02-24 DIAGNOSIS — M545 Low back pain, unspecified: Secondary | ICD-10-CM | POA: Diagnosis not present

## 2023-02-24 DIAGNOSIS — R262 Difficulty in walking, not elsewhere classified: Secondary | ICD-10-CM

## 2023-02-24 NOTE — Therapy (Signed)
OUTPATIENT PHYSICAL THERAPY THORACOLUMBAR TREATMENT   Patient Name: Cassidy Olsen MRN: [id number] DOB:January 10, 1961, 62 y.o., female Today's Date: 02/24/2023  END OF SESSION:  PT End of Session - 02/24/23 1037     Visit Number 9    Number of Visits 16    Date for PT Re-Evaluation 04/03/23    Authorization Type MCR    PT Start Time 1035    PT Stop Time 1115    PT Time Calculation (min) 40 min    Activity Tolerance Patient tolerated treatment well;Other (comment)   adjsuted activity due to fall coming to session   Behavior During Therapy [facility] for tasks assessed/performed              Past Medical History:  Diagnosis Date   ADHD    Arthritis 2010   Asthma    Back pain    Chronic headaches    Chronic pain    Constipation    Edema of both lower extremities    Family history of breast cancer    Family history of prostate cancer    Gallstones 2018   GERD (gastroesophageal reflux disease)    High cholesterol    History of kidney stones    Hx: UTI (urinary tract infection)    Hypertension    IBS (irritable bowel syndrome)    Interstitial cystitis 2012   Joint pain    Kidney problem    Migraine    Occipital neuralgia    Osteoarthritis    PNA (pneumonia) 2018   Pre-diabetes    Spinal stenosis    Trigeminal neuralgia    Past Surgical History:  Procedure Laterality Date   ABDOMINAL HYSTERECTOMY     ANKLE SURGERY Left 05/2009   APPENDECTOMY     Benign Tumor Removal Right 01/26/2017   right upper arm by Dr. Benard Rink at [facility] [place]   BRAIN SURGERY     CARPAL TUNNEL RELEASE Right 2014   CHOLECYSTECTOMY N/A 12/24/2020   Procedure: LAPAROSCOPIC CHOLECYSTECTOMY;  Surgeon: Abigail Miyamoto, MD;  Location: [facility] OR;  Service: General;  Laterality: N/A;   COLONOSCOPY     CYSTOSCOPY W/ URETERAL STENT PLACEMENT Left 07/25/2019   Procedure: CYSTOSCOPY WITH RETROGRADE PYELOGRAM/URETERAL STENT PLACEMENT;  Surgeon: Marcine Matar, MD;  Location: [facility] ORS;   Service: Urology;  Laterality: Left;   CYSTOSCOPY W/ URETERAL STENT REMOVAL Left 08/15/2019   Procedure: CYSTOSCOPY WITH STENT REMOVAL;  Surgeon: Marcine Matar, MD;  Location: [facility];  Service: Urology;  Laterality: Left;   CYSTOSCOPY WITH RETROGRADE PYELOGRAM, URETEROSCOPY AND STENT PLACEMENT Left 08/15/2019   Procedure: CYSTOSCOPY WITH RETROGRADE PYELOGRAM, URETEROSCOPY;  Surgeon: Marcine Matar, MD;  Location: [facility];  Service: Urology;  Laterality: Left;  90 MINS   CYSTOSCOPY WITH RETROGRADE PYELOGRAM, URETEROSCOPY AND STENT PLACEMENT Right 09/12/2019   Procedure: CYSTOSCOPY WITH RIGHT RETROGRADE PYELOGRAM  URETEROSCOPY WITH HOLMIUM LASER STONE EXTRACTION AND STENT PLACEMENT;  Surgeon: Marcine Matar, MD;  Location: [facility];  Service: Urology;  Laterality: Right;   DILATION AND CURETTAGE OF UTERUS     HOLMIUM LASER APPLICATION Left 08/15/2019   Procedure: HOLMIUM LASER APPLICATION;  Surgeon: Marcine Matar, MD;  Location: [facility];  Service: Urology;  Laterality: Left;   JOINT REPLACEMENT     R knee   KIDNEY STONE SURGERY     neuro spine similator  01/2021   OVARIAN CYST REMOVAL  05/2010   right knee revision  2016   ROTATOR CUFF REPAIR Right 02/2019   SPINE SURGERY  x 3   TUBAL LIGATION     Patient Active Problem List   Diagnosis Date Noted   Gastroesophageal reflux disease 12/18/2022   Spinal stenosis 12/18/2022   Obesity: Starting BMI 33.3 11/09/2022   BMI 30.0-30.9,adult Current BMI 30.9 11/09/2022   Hypertension, essential 07/26/2022   Class 1 obesity with serious comorbidity and body mass index (BMI) of 33.0 to 33.9 in adult 06/23/2022   Mood disorder (HCC)- with emotional eating 05/23/2022   Mixed hyperlipidemia 05/23/2022   Prediabetes 04/26/2022   Depression 04/26/2022   Allergic rhinitis 01/26/2022   Morbid obesity (HCC) 09/24/2021   Exertional chest pain 09/24/2021   S/P laparoscopic cholecystectomy 12/24/2020   RUQ abdominal pain 12/25/2019    Decreased range of knee movement 09/02/2019   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   Irritable bowel syndrome with constipation 09/02/2019   Calculus, ureter 07/24/2019   Chronic migraine without aura, with intractable migraine, so stated, with status migrainosus 05/30/2019   Chronic asthmatic bronchitis 05/14/2019   Excessive daytime sleepiness 01/08/2019   Upper airway cough syndrome 11/12/2018   Acute bacterial sinusitis 07/17/2018   Abnormal chest CT 07/17/2018   Trigeminal neuralgia of right side of face 02/01/2018   Occipital neuralgia 02/01/2018   Chronic migraine without aura without status migrainosus, not intractable 02/01/2018   Decreased strength 05/10/2017   Family history of breast cancer    Family history of prostate cancer    Chronic left shoulder pain 09/02/2015   Carpal tunnel syndrome 08/25/2011   Asthma 04/14/2009   Essential hypertension 04/14/2009   Hyperlipidemia 04/14/2009    PCP: Woodfin Ganja, MD   REFERRING PROVIDER: Venita Lick, MD  REFERRING DIAG:  Diagnosis  M54.59 (ICD-10-CM) - Other low back pain    Rationale for Evaluation and Treatment: Rehabilitation  THERAPY DIAG:  Pain, lumbar region  Muscle weakness (generalized)  Difficulty walking  ONSET DATE: March 2024  SUBJECTIVE:                                                                                                                                                                                           SUBJECTIVE STATEMENT: Some residual discomfort in LB after land treatment yesterday. Overall pain sx reduced.  Pt states the back feels better but does continue to have pain and difficulty with lifting. Pt had RFA recently and ablation scheduled for 8/14 for L4. Pt is in the CAM boot for the L foot. She has seen podiatrist and is in boot for the next 7 wks. Pt believes that aquatics has helped a lot. Pt is seeing chiro for her neck and is interested in PT for  the neck at  some point. 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   PERTINENT HISTORY:  2002 lumar micro discectomy  2007 C6-7 Fusion 2014 L5-S1 fusion  2015-2016 R knee TKA and revision  2022 lumbar nerve stimulator  PAIN:  Are you having pain? Yes: NPRS scale: 4-5/10 Pain location: localized to L lumbar  Pain description: achy, sharp, "rod pushing through my back," stairs Aggravating factors: any bending, lifting, standing Relieving factors: No relief  PRECAUTIONS: nerve stimulator  WEIGHT BEARING RESTRICTIONS: No  FALLS:  Has patient fallen in last 6 months? Yes. 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 At L3-4 there is a broad-based disc bulge. Severe bilateral facet arthropathy. Moderate spinal stenosis. Bilateral subarticular recess stenosis. Moderate left foraminal stenosis. Moderate-severe right foraminal stenosis. 2. At L4-5 there is a broad-based disc bulge. Moderate bilateral facet arthropathy with bilateral mild facet effusions. Severe spinal stenosis. Severe right foraminal stenosis. Mild-moderate left foraminal stenosis. 3. Posterior lumbar fusion at L5-S1 with a broad-based disc bulge. No spinal stenosis. Mild bilateral foraminal stenosis. 4. No acute osseous injury of the lumbar spine.   Fusion hardware noted at L5-S1 no abnormalities noted adjacent segment disease noted at L4-5 and L3-4.    PATIENT SURVEYS:  FOTO 32 40 @ DC 9 MCII  01/26/23:  FOTO 47 7/18 52  FOTO GOAL MET  SCREENING FOR RED FLAGS: Bowel or bladder incontinence: No Spinal tumors: No Cauda equina syndrome: No Compression fracture: No Abdominal aneurysm: No  COGNITION: Overall cognitive status: Within functional limits for tasks assessed     SENSATION: Light touch: Impaired   POSTURE: rounded shoulders and decreased lumbar lordosis  PALPATION: TTP of L lumbar paraspinals and L QL; hypertonicity of bilat lumbar paraspinals and QL  LUMBAR ROM:   AROM 7/18  Flexion 60% p! On L   Extension 50% R sided p!  Right lateral flexion 50% p!  Left lateral flexion 50%  Right rotation 75%  L p!  Left rotation 60%   (Blank rows = not tested)   LOWER EXTREMITY MMT:  4/5 throughout with mild pain into low back   LUMBAR SPECIAL TESTS:  Straight leg raise test: Positive, FABER test: Positive, and Trendelenburg sign: Positive  FUNCTIONAL TESTS:  5 times sit to stand: 17.2s 01/26/23: 5x STS at bench  13.9s  02/23/23: 5x STS at bench  12.5s    GAIT: Distance walked: 68ft Assistive device utilized: None Level of assistance: Complete Independence Comments: L antalgic gait due to booka  TODAY'S TREATMENT:  DATE:   02/24/23  Pt seen for aquatic therapy today.  Treatment took place in water 3.5-4.75 ft in depth at the [facility] pool. Temp of water was 91.  Pt entered/exited the pool via stairs independently with bilat rail.   * unsupported submerged to mid thoracic areawater : walking forward/backward with reciprocal arm swing and cues to allow Rt knee to flex during swing through STS from 3rd step unsupported x 6 vc for positioning and execution *hip hinges x 8 *STS from 3rd step x 5 then 4th step. Proper execution with good hip hinging *Bow and arrow R/L x10 alternating, improved execution *Green HB ue oscillations in sagittal plane wide  stance then left foot forward staggered (leading right foot not tolerated due to increased right shoulder discomfort. * straddling yellow noodle with and without yellow hand floats (for breast strike arms): cycling ; hip add/abd; cross country ski LEs  *Walking forward and back between exercises for recovery  Pt requires the buoyancy and hydrostatic pressure of water for support, and to offload joints by unweighting joint load by at least 50 % in navel deep water and by at least 75-80% in chest to neck deep water.  Viscosity of the water is needed for resistance of strengthening. Water current perturbations provides challenge to standing balance requiring increased core activation.   7/18  Supine piriformis stretch 30s 3x   Bridge with RTB at knees 4x8  Standing SB 2x10 small ROM YTB paloff press 2x10 each way  Edu about exam findings, progression of exercise, POC, HEP   6/20 Pt seen for aquatic therapy today.  Treatment took place in water 3.5-4.75 ft in depth at the [facility] pool. Temp of water was 91.  Pt entered/exited the pool via stairs independently with bilat rail.   * unsupported submerged to mid thoracic area : walking forward/backward with reciprocal arm swing  * straddling yellow noodle with and without yellow hand floats (for breast strike arms): cycling ; hip add/abd; cross country ski LEs  *Bow and arrow R/L x10 alternating, improved execution * side stepping arm addct x 4 widths lue yellow HB; right rainbow Cues for abdominal bracing and proper squat *TrA sets: solid noodle pull down wide stance and staggered, cues for core engagement. *Walking forward and back between exercises for recovery  Pt requires the buoyancy and hydrostatic pressure of water for support, and to offload joints by unweighting joint load by at least 50 % in navel deep water and by at least 75-80% in chest to neck deep water.  Viscosity of the water is needed for resistance of  strengthening. Water current perturbations provides challenge to standing balance requiring increased core activation.   PATIENT EDUCATION:  Education details: aquatic therapy progressions and modifications   Person educated: Patient Education method: Explanation, Demonstration, Tactile cues, Verbal cues,  Education comprehension: verbalized understanding, returned demonstration, verbal cues required, tactile cues required, and needs further education  HOME EXERCISE PROGRAM:  Access Code: [facility] URL: [email or web address] Date: 01/03/2023 Prepared by: Zebedee Iba    ASSESSMENT:  CLINICAL IMPRESSION: Pt with good execution of hip hinging. Able to translate into improved STS from low surface.  Pt tolerates progression of core strengthening using HB.  Was cognoscente of right shoulder dysfunction and avoided increase stress on joint.  Did have to modify positioning and eliminate completion in frontal plane. She reports feeling good core activation/strengthening with session today with reduction in LBP 3/10.     FROM EVAL: Patient is a 62  y.o. female who was seen today for physical therapy evaluation and treatment for c/c of chronic LBP. Pt's s/s appear consistent with lumbar radiculopathy and complex presentation given long standing history of LBP, multiple surgeries, as well as several concomitant pain syndromes contributing to her chronic pain. Pt's pain is highly sensitive and irritable with movement. Pt's is more pain dominant at this time. Plan to continue with aquatic therapy and land therapy for lumbar mobility and general functional mobility/LE strength at future sessions. Plan for progressive strengthening as pt's previous PT episodes did not target resistance training. Pt would benefit from continued skilled therapy in order to reach goals and maximize functional lumbopelvic strength and ROM for prevention of further functional decline.    OBJECTIVE IMPAIRMENTS:  decreased activity tolerance, decreased endurance, decreased mobility, decreased ROM, decreased strength, hypomobility, increased muscle spasms, impaired flexibility, improper body mechanics, postural dysfunction, and pain.   ACTIVITY LIMITATIONS: carrying, lifting, sleeping, transfers, dressing, and bending, squatting  PARTICIPATION LIMITATIONS: cleaning, laundry, yard work, community activity, and exercise  PERSONAL FACTORS: Age, Fitness, Time since onset of injury/illness/exacerbation, and 3+ comorbidities:  are also affecting patient's functional outcome.   REHAB POTENTIAL: Fair  CLINICAL DECISION MAKING: unstable/complicated  EVALUATION COMPLEXITY: Moderate   SHORT TERM GOALS: Target date: 02/14/2023   Pt will become independent with HEP in order to demonstrate synthesis of PT education.   Goal status: INITIAL  2.  Pt will report at least 2 pt reduction on NPRS scale for pain in order to demonstrate functional improvement with household activity, self care, and ADL.   Goal status: INITIAL  3. Pt will score at least 9 pt increase on FOTO to demonstrate functional improvement in MCII and pt perceived function.      Goal status:MET - 01/26/23  LONG TERM GOALS: Target date: 03/28/2023    Pt  will become independent with final HEP in order to demonstrate synthesis of PT education.  Baseline:  Goal status: INITIAL  2. Pt will score >/= 40 on FOTO to demonstrate improvement in perceived lumbar function.  Baseline:  Goal status: MET- 01/26/23  3.  Pt will be able to perform 5XSTS in under 12s  in order to demonstrate functional improvement above the cut off score for adults.  Goal status: IN PROGRESS  -01/26/23  4.  Pt will be able to demonstrate/report ability to walk >15 mins without pain in order to demonstrate functional improvement and tolerance to exercise and community mobility.   Goal status: Ongoing PLAN:  PT FREQUENCY: 1-2x/week  PT DURATION: 12 weeks (likely to  D/C by 8 with independent management)  PLANNED INTERVENTIONS: Therapeutic exercises, Therapeutic activity, Neuromuscular re-education, Balance training, Gait training, Patient/Family education, Self Care, Joint mobilization, Joint manipulation, Orthotic/Fit training, DME instructions, Aquatic Therapy, Dry Needling, Electrical stimulation, Spinal manipulation, Spinal mobilization, Cryotherapy, Moist heat, scar mobilization, Splintting, Taping, Vasopneumatic device, Traction, Ultrasound, Ionotophoresis 4mg /ml Dexamethasone, Manual therapy, and Re-evaluation.  PLAN FOR NEXT SESSION: aquatic to start for lumbar mobility, pain reduction with movement, and general LE strength; progress to land based strengthening/lifting  Corrie Dandy Tomma Lightning) Takyia Sindt MPT 02/24/23 10:39 AM

## 2023-03-01 ENCOUNTER — Ambulatory Visit (HOSPITAL_BASED_OUTPATIENT_CLINIC_OR_DEPARTMENT_OTHER): Payer: Medicare Other | Admitting: Physical Therapy

## 2023-03-02 ENCOUNTER — Ambulatory Visit (HOSPITAL_BASED_OUTPATIENT_CLINIC_OR_DEPARTMENT_OTHER): Payer: Medicare Other | Admitting: Physical Therapy

## 2023-03-02 ENCOUNTER — Encounter: Payer: Self-pay | Admitting: Family Medicine

## 2023-03-02 MED ORDER — AMITRIPTYLINE HCL 50 MG PO TABS: 50.0000 mg | ORAL_TABLET | Freq: Every day | ORAL | 0 refills | Status: DC

## 2023-03-02 NOTE — Telephone Encounter (Signed)
Botox scheduled on 03/29/23

## 2023-03-07 ENCOUNTER — Ambulatory Visit (HOSPITAL_BASED_OUTPATIENT_CLINIC_OR_DEPARTMENT_OTHER): Payer: Medicare Other | Admitting: Physical Therapy

## 2023-03-09 ENCOUNTER — Ambulatory Visit (HOSPITAL_BASED_OUTPATIENT_CLINIC_OR_DEPARTMENT_OTHER): Payer: Medicare Other | Admitting: Physical Therapy

## 2023-03-15 ENCOUNTER — Ambulatory Visit (HOSPITAL_BASED_OUTPATIENT_CLINIC_OR_DEPARTMENT_OTHER): Payer: Medicare Other | Attending: Orthopedic Surgery | Admitting: Physical Therapy

## 2023-03-15 DIAGNOSIS — R262 Difficulty in walking, not elsewhere classified: Secondary | ICD-10-CM | POA: Insufficient documentation

## 2023-03-15 DIAGNOSIS — M6281 Muscle weakness (generalized): Secondary | ICD-10-CM | POA: Insufficient documentation

## 2023-03-15 DIAGNOSIS — M545 Low back pain, unspecified: Secondary | ICD-10-CM | POA: Insufficient documentation

## 2023-03-17 ENCOUNTER — Other Ambulatory Visit (HOSPITAL_COMMUNITY): Payer: Self-pay

## 2023-03-17 ENCOUNTER — Encounter (HOSPITAL_BASED_OUTPATIENT_CLINIC_OR_DEPARTMENT_OTHER): Payer: Medicare Other | Admitting: Physical Therapy

## 2023-03-20 ENCOUNTER — Other Ambulatory Visit (HOSPITAL_COMMUNITY): Payer: Self-pay

## 2023-03-21 ENCOUNTER — Ambulatory Visit (HOSPITAL_BASED_OUTPATIENT_CLINIC_OR_DEPARTMENT_OTHER): Payer: Medicare Other | Admitting: Physical Therapy

## 2023-03-21 ENCOUNTER — Encounter (HOSPITAL_BASED_OUTPATIENT_CLINIC_OR_DEPARTMENT_OTHER): Payer: Self-pay | Admitting: Physical Therapy

## 2023-03-21 ENCOUNTER — Telehealth (HOSPITAL_BASED_OUTPATIENT_CLINIC_OR_DEPARTMENT_OTHER): Payer: Self-pay | Admitting: Physical Therapy

## 2023-03-21 DIAGNOSIS — M6281 Muscle weakness (generalized): Secondary | ICD-10-CM | POA: Diagnosis not present

## 2023-03-21 DIAGNOSIS — M545 Low back pain, unspecified: Secondary | ICD-10-CM | POA: Diagnosis not present

## 2023-03-21 DIAGNOSIS — R262 Difficulty in walking, not elsewhere classified: Secondary | ICD-10-CM

## 2023-03-21 NOTE — Telephone Encounter (Signed)
Patient did not show for aquatic therapy appointment.  Called and left message regarding missed appointment and cancellation/ no show policy.  Requested patient return phone call confirming next upcoming appointment and informed patient that if she doesn't show for next scheduled PT appointment she will be discharged from physical therapy.   [phone].  Mayer Camel, PTA 03/21/23 9:41 AM [facility] [address] [place], [place], [address] Phone: [phone]   Fax:  [phone]

## 2023-03-21 NOTE — Therapy (Signed)
OUTPATIENT PHYSICAL THERAPY THORACOLUMBAR TREATMENT   Patient Name: Cassidy Olsen MRN: [id number] DOB:Jan 12, 1961, 62 y.o., female Today's Date: 03/21/2023  END OF SESSION:  PT End of Session - 03/21/23 1705     Visit Number 10    Number of Visits 16    Date for PT Re-Evaluation 04/03/23    Authorization Type MCR    Progress Note Due on Visit 18    PT Start Time 1701    PT Stop Time 1745    PT Time Calculation (min) 44 min    Activity Tolerance Patient tolerated treatment well;Other (comment)   adjsuted activity due to fall coming to session   Behavior During Therapy [facility] for tasks assessed/performed              Past Medical History:  Diagnosis Date   ADHD    Arthritis 2010   Asthma    Back pain    Chronic headaches    Chronic pain    Constipation    Edema of both lower extremities    Family history of breast cancer    Family history of prostate cancer    Gallstones 2018   GERD (gastroesophageal reflux disease)    High cholesterol    History of kidney stones    Hx: UTI (urinary tract infection)    Hypertension    IBS (irritable bowel syndrome)    Interstitial cystitis 2012   Joint pain    Kidney problem    Migraine    Occipital neuralgia    Osteoarthritis    PNA (pneumonia) 2018   Pre-diabetes    Spinal stenosis    Trigeminal neuralgia    Past Surgical History:  Procedure Laterality Date   ABDOMINAL HYSTERECTOMY     ANKLE SURGERY Left 05/2009   APPENDECTOMY     Benign Tumor Removal Right 01/26/2017   right upper arm by Dr. Benard Rink at [facility] [place]   BRAIN SURGERY     CARPAL TUNNEL RELEASE Right 2014   CHOLECYSTECTOMY N/A 12/24/2020   Procedure: LAPAROSCOPIC CHOLECYSTECTOMY;  Surgeon: Abigail Miyamoto, MD;  Location[facility] OR;  Service: General;  Laterality: N/A;   COLONOSCOPY     CYSTOSCOPY W/ URETERAL STENT PLACEMENT Left 07/25/2019   Procedure: CYSTOSCOPY WITH RETROGRADE PYELOGRAM/URETERAL STENT PLACEMENT;  Surgeon: Marcine Matar, MD;  Location: [facility] ORS;  Service: Urology;  Laterality: Left;   CYSTOSCOPY W/ URETERAL STENT REMOVAL Left 08/15/2019   Procedure: CYSTOSCOPY WITH STENT REMOVAL;  Surgeon: Marcine Matar, MD;  Location: [facility] OR[facility];  Service: Urology;  Laterality: Left;   CYSTOSCOPY WITH RETROGRADE PYELOGRAM, URETEROSCOPY AND STENT PLACEMENT Left 08/15/2019   Procedure: CYSTOSCOPY WITH RETROGRADE PYELOGRAM, URETEROSCOPY;  Surgeon: Marcine Matar, MD;  Location: [facility];  Service: Urology;  Laterality: Left;  90 MINS   CYSTOSCOPY WITH RETROGRADE PYELOGRAM, URETEROSCOPY AND STENT PLACEMENT Right 09/12/2019   Procedure: CYSTOSCOPY WITH RIGHT RETROGRADE PYELOGRAM  URETEROSCOPY WITH HOLMIUM LASER STONE EXTRACTION AND STENT PLACEMENT;  Surgeon: Marcine Matar, MD;  Location: [facility] OR[facility];  Service: Urology;  Laterality: Right;   DILATION AND CURETTAGE OF UTERUS     HOLMIUM LASER APPLICATION Left 08/15/2019   Procedure: HOLMIUM LASER APPLICATION;  Surgeon: Marcine Matar, MD;  Location: [facility];  Service: Urology;  Laterality: Left;   JOINT REPLACEMENT     R knee   KIDNEY STONE SURGERY     neuro spine similator  01/2021   OVARIAN CYST REMOVAL  05/2010   right knee revision  2016   ROTATOR CUFF  REPAIR Right 02/2019   SPINE SURGERY     x 3   TUBAL LIGATION     Patient Active Problem List   Diagnosis Date Noted   Gastroesophageal reflux disease 12/18/2022   Spinal stenosis 12/18/2022   Obesity: Starting BMI 33.3 11/09/2022   BMI 30.0-30.9,adult Current BMI 30.9 11/09/2022   Hypertension, essential 07/26/2022   Class 1 obesity with serious comorbidity and body mass index (BMI) of 33.0 to 33.9 in adult 06/23/2022   Mood disorder (HCC)- with emotional eating 05/23/2022   Mixed hyperlipidemia 05/23/2022   Prediabetes 04/26/2022   Depression 04/26/2022   Allergic rhinitis 01/26/2022   Morbid obesity (HCC) 09/24/2021   Exertional chest pain 09/24/2021   S/P laparoscopic cholecystectomy 12/24/2020   RUQ  abdominal pain 12/25/2019   Decreased range of knee movement 09/02/2019   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   Irritable bowel syndrome with constipation 09/02/2019   Calculus, ureter 07/24/2019   Chronic migraine without aura, with intractable migraine, so stated, with status migrainosus 05/30/2019   Chronic asthmatic bronchitis 05/14/2019   Excessive daytime sleepiness 01/08/2019   Upper airway cough syndrome 11/12/2018   Acute bacterial sinusitis 07/17/2018   Abnormal chest CT 07/17/2018   Trigeminal neuralgia of right side of face 02/01/2018   Occipital neuralgia 02/01/2018   Chronic migraine without aura without status migrainosus, not intractable 02/01/2018   Decreased strength 05/10/2017   Family history of breast cancer    Family history of prostate cancer    Chronic left shoulder pain 09/02/2015   Carpal tunnel syndrome 08/25/2011   Asthma 04/14/2009   Essential hypertension 04/14/2009   Hyperlipidemia 04/14/2009    PCP: Woodfin Ganja, MD   REFERRING PROVIDER: Venita Lick, MD  REFERRING DIAG:  Diagnosis  M54.59 (ICD-10-CM) - Other low back pain    Rationale for Evaluation and Treatment: Rehabilitation  THERAPY DIAG:  Pain, lumbar region  Muscle weakness (generalized)  Difficulty walking  ONSET DATE: March 2024  SUBJECTIVE:                                                                                                                                                                                           SUBJECTIVE STATEMENT: Ablation scheduled for tomorrow. Keep my nerve stimulator on at all times  Pt states the back feels better but does continue to have pain and difficulty with lifting. Pt had RFA recently and ablation scheduled for 8/14 for L4. Pt is in the CAM boot for the L foot. She has seen podiatrist and is in boot for the next 7 wks. Pt believes that aquatics has helped a lot. Pt is seeing  chiro for her neck and is interested in PT  for the neck at some point. 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   PERTINENT HISTORY:  2002 lumar micro discectomy  2007 C6-7 Fusion 2014 L5-S1 fusion  2015-2016 R knee TKA and revision  2022 lumbar nerve stimulator  PAIN:  Are you having pain? Yes: NPRS scale: 7/10 Pain location: localized to L lumbar  Pain description: achy, sharp, "rod pushing through my back," stairs Aggravating factors: any bending, lifting, standing Relieving factors: No relief  PRECAUTIONS: nerve stimulator  WEIGHT BEARING RESTRICTIONS: No  FALLS:  Has patient fallen in last 6 months? Yes. 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 At L3-4 there is a broad-based disc bulge. Severe bilateral facet arthropathy. Moderate spinal stenosis. Bilateral subarticular recess stenosis. Moderate left foraminal stenosis. Moderate-severe right foraminal stenosis. 2. At L4-5 there is a broad-based disc bulge. Moderate bilateral facet arthropathy with bilateral mild facet effusions. Severe spinal stenosis. Severe right foraminal stenosis. Mild-moderate left foraminal stenosis. 3. Posterior lumbar fusion at L5-S1 with a broad-based disc bulge. No spinal stenosis. Mild bilateral foraminal stenosis. 4. No acute osseous injury of the lumbar spine.   Fusion hardware noted at L5-S1 no abnormalities noted adjacent segment disease noted at L4-5 and L3-4.    PATIENT SURVEYS:  FOTO 32 40 @ DC 9 MCII  01/26/23:  FOTO  47 7/18 52 FOTO GOAL MET  SCREENING FOR RED FLAGS: Bowel or bladder incontinence: No Spinal tumors: No Cauda equina syndrome: No Compression fracture: No Abdominal aneurysm: No  COGNITION: Overall cognitive status: Within functional limits for tasks assessed     SENSATION: Light touch: Impaired   POSTURE: rounded shoulders and decreased lumbar lordosis  PALPATION: TTP of L lumbar paraspinals and L QL; hypertonicity of bilat lumbar paraspinals and QL  LUMBAR ROM:   AROM 7/18  Flexion 60% p! On L   Extension 50% R sided p!  Right lateral flexion 50% p!  Left lateral flexion 50%  Right rotation 75%  L p!  Left rotation 60%   (Blank rows = not tested)   LOWER EXTREMITY MMT:  4/5 throughout with mild pain into low back   LUMBAR SPECIAL TESTS:  Straight leg raise test: Positive, FABER test: Positive, and Trendelenburg sign: Positive  FUNCTIONAL TESTS:  5 times sit to stand: 17.2s 01/26/23: 5x STS at bench  13.9s  02/23/23: 5x STS at bench  12.5s    GAIT: Distance walked: 48ft Assistive device utilized: None Level of assistance: Complete Independence Comments: L antalgic gait due to booka  TODAY'S TREATMENT:  DATE:  03/21/23  Pt seen for aquatic therapy today.  Treatment took place in water 3.5-4.75 ft in depth at the [facility] pool. Temp of water was 91.  Pt entered/exited the pool via stairs independently with bilat rail.   * unsupported walking forward, backward and side stepping * straddling yellow noodle with and without yellow hand floats (for breast strike arms): cycling ; hip add/abd; cross country ski LEs  *L stretch x 3; L stretch with tail wagging *Lumbo pelvic movement seated on noodle: ant/post pelvic tilts; hip hiking; rotation *"Old man stretch" x 3 *hip hinges x 8-10 *Bow and arrow R/L x10 alternating, improved  execution *Rainbow hb carry for core engagement forward and back bilat ue HB x 2 widths each; unilateral HB carry *Seated on lift: LAQ; hip add/abd 3 x 10 quick reps *TrA sets: solid noodle pull down staggered stance then wide stance x 8 reps ea.  Good execution maintains abd bracing throughout *Plank on 3rd step with hip extension, glute squeeze end range. *Walking forward and back between exercises for recovery  Pt requires the buoyancy and hydrostatic pressure of water for support, and to offload joints by unweighting joint load by at least 50 % in navel deep water and by at least 75-80% in chest to neck deep water.  Viscosity of the water is needed for resistance of strengthening. Water current perturbations provides challenge to standing balance requiring increased core activation.  02/24/23  Pt seen for aquatic therapy today.  Treatment took place in water 3.5-4.75 ft in depth at the [facility] pool. Temp of water was 91.  Pt entered/exited the pool via stairs independently with bilat rail.   * unsupported submerged to mid thoracic areawater : walking forward/backward with reciprocal arm swing and cues to allow Rt knee to flex during swing through STS from 3rd step unsupported x 6 vc for positioning and execution *hip hinges x 8 *STS from 3rd step x 5 then 4th step. Proper execution with good hip hinging *Bow and arrow R/L x10 alternating, improved execution *Green HB ue oscillations in sagittal plane wide stance then left foot forward staggered (leading right foot not tolerated due to increased right shoulder discomfort. * straddling yellow noodle with and without yellow hand floats (for breast strike arms): cycling ; hip add/abd; cross country ski LEs  *Walking forward and back between exercises for recovery  Pt requires the buoyancy and hydrostatic pressure of water for support, and to offload joints by unweighting joint load by at least 50 % in navel deep water and by at  least 75-80% in chest to neck deep water.  Viscosity of the water is needed for resistance of strengthening. Water current perturbations provides challenge to standing balance requiring increased core activation.   7/18  Supine piriformis stretch 30s 3x   Bridge with RTB at knees 4x8  Standing SB 2x10 small ROM YTB paloff press 2x10 each way  Edu about exam findings, progression of exercise, POC, HEP    PATIENT EDUCATION:  Education details: aquatic therapy progressions and modifications   Person educated: Patient Education method: Explanation, Demonstration, Tactile cues, Verbal cues,  Education comprehension: verbalized understanding, returned demonstration, verbal cues required, tactile cues required, and needs further education  HOME EXERCISE PROGRAM:  Access Code: [facility] URL: [email or web address] Date: 01/03/2023 Prepared by: Zebedee Iba    ASSESSMENT:  CLINICAL IMPRESSION: Pt reports consistent LBP with radicular symptoms into right leg. Abd bracing decreases LBP with exercises. Initially she has some guarding today  with movmeent but decreases as session progresses.She has had a month long hiatus from therapy due to being OOT then sick. Reports high back pain which reduces by 2-3 NPRS by end of session. Scheduled ablation tomorrow morning lb.  Anticipate decrease in pain with intervention.       FROM EVAL: Patient is a 62 y.o. female who was seen today for physical therapy evaluation and treatment for c/c of chronic LBP. Pt's s/s appear consistent with lumbar radiculopathy and complex presentation given long standing history of LBP, multiple surgeries, as well as several concomitant pain syndromes contributing to her chronic pain. Pt's pain is highly sensitive and irritable with movement. Pt's is more pain dominant at this time. Plan to continue with aquatic therapy and land therapy for lumbar mobility and general functional mobility/LE strength at  future sessions. Plan for progressive strengthening as pt's previous PT episodes did not target resistance training. Pt would benefit from continued skilled therapy in order to reach goals and maximize functional lumbopelvic strength and ROM for prevention of further functional decline.    OBJECTIVE IMPAIRMENTS: decreased activity tolerance, decreased endurance, decreased mobility, decreased ROM, decreased strength, hypomobility, increased muscle spasms, impaired flexibility, improper body mechanics, postural dysfunction, and pain.   ACTIVITY LIMITATIONS: carrying, lifting, sleeping, transfers, dressing, and bending, squatting  PARTICIPATION LIMITATIONS: cleaning, laundry, yard work, community activity, and exercise  PERSONAL FACTORS: Age, Fitness, Time since onset of injury/illness/exacerbation, and 3+ comorbidities:  are also affecting patient's functional outcome.   REHAB POTENTIAL: Fair  CLINICAL DECISION MAKING: unstable/complicated  EVALUATION COMPLEXITY: Moderate   SHORT TERM GOALS: Target date: 02/14/2023   Pt will become independent with HEP in order to demonstrate synthesis of PT education.   Goal status: INITIAL  2.  Pt will report at least 2 pt reduction on NPRS scale for pain in order to demonstrate functional improvement with household activity, self care, and ADL.   Goal status: INITIAL  3. Pt will score at least 9 pt increase on FOTO to demonstrate functional improvement in MCII and pt perceived function.      Goal status:MET - 01/26/23  LONG TERM GOALS: Target date: 03/28/2023    Pt  will become independent with final HEP in order to demonstrate synthesis of PT education.  Baseline:  Goal status: INITIAL  2. Pt will score >/= 40 on FOTO to demonstrate improvement in perceived lumbar function.  Baseline:  Goal status: MET- 01/26/23  3.  Pt will be able to perform 5XSTS in under 12s  in order to demonstrate functional improvement above the cut off score for  adults.  Goal status: IN PROGRESS  -01/26/23  4.  Pt will be able to demonstrate/report ability to walk >15 mins without pain in order to demonstrate functional improvement and tolerance to exercise and community mobility.   Goal status: Ongoing PLAN:  PT FREQUENCY: 1-2x/week  PT DURATION: 12 weeks (likely to D/C by 8 with independent management)  PLANNED INTERVENTIONS: Therapeutic exercises, Therapeutic activity, Neuromuscular re-education, Balance training, Gait training, Patient/Family education, Self Care, Joint mobilization, Joint manipulation, Orthotic/Fit training, DME instructions, Aquatic Therapy, Dry Needling, Electrical stimulation, Spinal manipulation, Spinal mobilization, Cryotherapy, Moist heat, scar mobilization, Splintting, Taping, Vasopneumatic device, Traction, Ultrasound, Ionotophoresis 4mg /ml Dexamethasone, Manual therapy, and Re-evaluation.  PLAN FOR NEXT SESSION: aquatic to start for lumbar mobility, pain reduction with movement, and general LE strength; progress to land based strengthening/lifting  Corrie Dandy Tomma Lightning)  MPT 03/21/23 5:10 PM

## 2023-03-22 ENCOUNTER — Other Ambulatory Visit (HOSPITAL_COMMUNITY): Payer: Self-pay

## 2023-03-22 DIAGNOSIS — M47816 Spondylosis without myelopathy or radiculopathy, lumbar region: Secondary | ICD-10-CM | POA: Diagnosis not present

## 2023-03-22 DIAGNOSIS — M47817 Spondylosis without myelopathy or radiculopathy, lumbosacral region: Secondary | ICD-10-CM | POA: Diagnosis not present

## 2023-03-24 ENCOUNTER — Other Ambulatory Visit (HOSPITAL_COMMUNITY): Payer: Self-pay | Admitting: Chiropractic Medicine

## 2023-03-24 DIAGNOSIS — M5412 Radiculopathy, cervical region: Secondary | ICD-10-CM

## 2023-03-27 ENCOUNTER — Ambulatory Visit (HOSPITAL_COMMUNITY)
Admission: RE | Admit: 2023-03-27 | Discharge: 2023-03-27 | Disposition: A | Discharge status: Home / Self Care | Payer: Medicare Other | Source: Ambulatory Visit | Attending: Chiropractic Medicine | Admitting: Chiropractic Medicine

## 2023-03-27 ENCOUNTER — Other Ambulatory Visit (HOSPITAL_COMMUNITY): Payer: Self-pay

## 2023-03-27 DIAGNOSIS — M5011 Cervical disc disorder with radiculopathy,  high cervical region: Secondary | ICD-10-CM | POA: Diagnosis not present

## 2023-03-27 DIAGNOSIS — M4802 Spinal stenosis, cervical region: Secondary | ICD-10-CM | POA: Diagnosis not present

## 2023-03-27 DIAGNOSIS — M4722 Other spondylosis with radiculopathy, cervical region: Secondary | ICD-10-CM | POA: Diagnosis not present

## 2023-03-27 DIAGNOSIS — M5412 Radiculopathy, cervical region: Secondary | ICD-10-CM | POA: Diagnosis not present

## 2023-03-27 DIAGNOSIS — Z981 Arthrodesis status: Secondary | ICD-10-CM | POA: Diagnosis not present

## 2023-03-28 ENCOUNTER — Ambulatory Visit (HOSPITAL_BASED_OUTPATIENT_CLINIC_OR_DEPARTMENT_OTHER): Payer: Medicare Other | Admitting: Physical Therapy

## 2023-03-28 ENCOUNTER — Other Ambulatory Visit (HOSPITAL_COMMUNITY): Payer: Self-pay

## 2023-03-28 ENCOUNTER — Other Ambulatory Visit: Payer: Self-pay

## 2023-03-28 ENCOUNTER — Encounter (HOSPITAL_BASED_OUTPATIENT_CLINIC_OR_DEPARTMENT_OTHER): Payer: Self-pay | Admitting: Physical Therapy

## 2023-03-28 DIAGNOSIS — M545 Low back pain, unspecified: Secondary | ICD-10-CM | POA: Diagnosis not present

## 2023-03-28 DIAGNOSIS — R262 Difficulty in walking, not elsewhere classified: Secondary | ICD-10-CM

## 2023-03-28 DIAGNOSIS — M6281 Muscle weakness (generalized): Secondary | ICD-10-CM
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
## 2023-03-28 NOTE — Therapy (Signed)
OUTPATIENT PHYSICAL THERAPY THORACOLUMBAR TREATMENT   Patient Name: Cassidy Olsen MRN: [id number] DOB:1961-02-09, 62 y.o., female Today's Date: 03/28/2023  END OF SESSION:  PT End of Session - 03/28/23 0915     Visit Number 11    Number of Visits 16    Date for PT Re-Evaluation 04/03/23    Authorization Type MCR    Progress Note Due on Visit 18    PT Start Time 0905    PT Stop Time 0945    PT Time Calculation (min) 40 min    Activity Tolerance Patient tolerated treatment well;Other (comment)   adjsuted activity due to fall coming to session   Behavior During Therapy [facility] for tasks assessed/performed              Past Medical History:  Diagnosis Date   ADHD    Arthritis 2010   Asthma    Back pain    Chronic headaches    Chronic pain    Constipation    Edema of both lower extremities    Family history of breast cancer    Family history of prostate cancer    Gallstones 2018   GERD (gastroesophageal reflux disease)    High cholesterol    History of kidney stones    Hx: UTI (urinary tract infection)    Hypertension    IBS (irritable bowel syndrome)    Interstitial cystitis 2012   Joint pain    Kidney problem    Migraine    Occipital neuralgia    Osteoarthritis    PNA (pneumonia) 2018   Pre-diabetes    Spinal stenosis    Trigeminal neuralgia    Past Surgical History:  Procedure Laterality Date   ABDOMINAL HYSTERECTOMY     ANKLE SURGERY Left 05/2009   APPENDECTOMY     Benign Tumor Removal Right 01/26/2017   right upper arm by Dr. Benard Rink at [facility] [place]   BRAIN SURGERY     CARPAL TUNNEL RELEASE Right 2014   CHOLECYSTECTOMY N/A 12/24/2020   Procedure: LAPAROSCOPIC CHOLECYSTECTOMY;  Surgeon: Abigail Miyamoto, MD;  Location: [facility];  Service: General;  Laterality: N/A;   COLONOSCOPY     CYSTOSCOPY W/ URETERAL STENT PLACEMENT Left 07/25/2019   Procedure: CYSTOSCOPY WITH RETROGRADE PYELOGRAM/URETERAL STENT PLACEMENT;  Surgeon: Marcine Matar, MD;  Location: [facility];  Service: Urology;  Laterality: Left;   CYSTOSCOPY W/ URETERAL STENT REMOVAL Left 08/15/2019   Procedure: CYSTOSCOPY WITH STENT REMOVAL;  Surgeon: Marcine Matar, MD;  Location: [facility];  Service: Urology;  Laterality: Left;   CYSTOSCOPY WITH RETROGRADE PYELOGRAM, URETEROSCOPY AND STENT PLACEMENT Left 08/15/2019   Procedure: CYSTOSCOPY WITH RETROGRADE PYELOGRAM, URETEROSCOPY;  Surgeon: Marcine Matar, MD;  Location: [facility];  Service: Urology;  Laterality: Left;  90 MINS   CYSTOSCOPY WITH RETROGRADE PYELOGRAM, URETEROSCOPY AND STENT PLACEMENT Right 09/12/2019   Procedure: CYSTOSCOPY WITH RIGHT RETROGRADE PYELOGRAM  URETEROSCOPY WITH HOLMIUM LASER STONE EXTRACTION AND STENT PLACEMENT;  Surgeon: Marcine Matar, MD;  Location: [facility];  Service: Urology;  Laterality: Right;   DILATION AND CURETTAGE OF UTERUS     HOLMIUM LASER APPLICATION Left 08/15/2019   Procedure: HOLMIUM LASER APPLICATION;  Surgeon: Marcine Matar, MD;  Location: [facility];  Service: Urology;  Laterality: Left;   JOINT REPLACEMENT     R knee   KIDNEY STONE SURGERY     neuro spine similator  01/2021   OVARIAN CYST REMOVAL  05/2010   right knee revision  2016   ROTATOR CUFF  REPAIR Right 02/2019   SPINE SURGERY     x 3   TUBAL LIGATION     Patient Active Problem List   Diagnosis Date Noted   Gastroesophageal reflux disease 12/18/2022   Spinal stenosis 12/18/2022   Obesity: Starting BMI 33.3 11/09/2022   BMI 30.0-30.9,adult Current BMI 30.9 11/09/2022   Hypertension, essential 07/26/2022   Class 1 obesity with serious comorbidity and body mass index (BMI) of 33.0 to 33.9 in adult 06/23/2022   Mood disorder (HCC)- with emotional eating 05/23/2022   Mixed hyperlipidemia 05/23/2022   Prediabetes 04/26/2022   Depression 04/26/2022   Allergic rhinitis 01/26/2022   Morbid obesity (HCC) 09/24/2021   Exertional chest pain 09/24/2021   S/P laparoscopic cholecystectomy 12/24/2020   RUQ  abdominal pain 12/25/2019   Decreased range of knee movement 09/02/2019   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   Irritable bowel syndrome with constipation 09/02/2019   Calculus, ureter 07/24/2019   Chronic migraine without aura, with intractable migraine, so stated, with status migrainosus 05/30/2019   Chronic asthmatic bronchitis 05/14/2019   Excessive daytime sleepiness 01/08/2019   Upper airway cough syndrome 11/12/2018   Acute bacterial sinusitis 07/17/2018   Abnormal chest CT 07/17/2018   Trigeminal neuralgia of right side of face 02/01/2018   Occipital neuralgia 02/01/2018   Chronic migraine without aura without status migrainosus, not intractable 02/01/2018   Decreased strength 05/10/2017   Family history of breast cancer    Family history of prostate cancer    Chronic left shoulder pain 09/02/2015   Carpal tunnel syndrome 08/25/2011   Asthma 04/14/2009   Essential hypertension 04/14/2009   Hyperlipidemia 04/14/2009    PCP: Woodfin Ganja, MD   REFERRING PROVIDER: Venita Lick, MD  REFERRING DIAG:  Diagnosis  M54.59 (ICD-10-CM) - Other low back pain    Rationale for Evaluation and Treatment: Rehabilitation  THERAPY DIAG:  Pain, lumbar region  Muscle weakness (generalized)  Difficulty walking  ONSET DATE: March 2024  SUBJECTIVE:                                                                                                                                                                                           SUBJECTIVE STATEMENT: Ablation helped a little so far.  I did test it yesterday and worked which increased the pain some  Pt states the back feels better but does continue to have pain and difficulty with lifting. Pt had RFA recently and ablation scheduled for 8/14 for L4. Pt is in the CAM boot for the L foot. She has seen podiatrist and is in boot for the next 7 wks. Pt believes that [employer]  has helped a lot. Pt is seeing chiro for her neck  and is interested in PT for the neck at some point. 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   PERTINENT HISTORY:  2002 lumar micro discectomy  2007 C6-7 Fusion 2014 L5-S1 fusion  2015-2016 R knee TKA and revision  2022 lumbar nerve stimulator  PAIN:  Are you having pain? Yes: NPRS scale: 6/10 Pain location: localized to L lumbar  Pain description: achy, sharp, "rod pushing through my back," stairs Aggravating factors: any bending, lifting, standing Relieving factors: No relief  PRECAUTIONS: nerve stimulator  WEIGHT BEARING RESTRICTIONS: No  FALLS:  Has patient fallen in last 6 months? Yes. 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 At L3-4 there is a broad-based disc bulge. Severe bilateral facet arthropathy. Moderate spinal stenosis. Bilateral subarticular recess stenosis. Moderate left foraminal stenosis. Moderate-severe right foraminal stenosis. 2. At L4-5 there is a broad-based disc bulge. Moderate bilateral facet arthropathy with bilateral mild facet effusions. Severe spinal stenosis. Severe right foraminal stenosis. Mild-moderate left foraminal stenosis. 3. Posterior lumbar fusion at L5-S1 with a broad-based disc bulge. No spinal stenosis. Mild bilateral foraminal stenosis. 4. No acute osseous injury of the lumbar spine.   Fusion hardware noted at L5-S1 no abnormalities noted adjacent segment disease noted at L4-5 and L3-4.    PATIENT SURVEYS:  FOTO 32 40 @ DC 9  MCII  01/26/23:  FOTO 47 7/18 52 FOTO GOAL MET  SCREENING FOR RED FLAGS: Bowel or bladder incontinence: No Spinal tumors: No Cauda equina syndrome: No Compression fracture: No Abdominal aneurysm: No  COGNITION: Overall cognitive status: Within functional limits for tasks assessed     SENSATION: Light touch: Impaired   POSTURE: rounded shoulders and decreased lumbar lordosis  PALPATION: TTP of L lumbar paraspinals and L QL; hypertonicity of bilat lumbar paraspinals and QL  LUMBAR ROM:   AROM 7/18  Flexion 60% p! On L   Extension 50% R sided p!  Right lateral flexion 50% p!  Left lateral flexion 50%  Right rotation 75%  L p!  Left rotation 60%   (Blank rows = not tested)   LOWER EXTREMITY MMT:  4/5 throughout with mild pain into low back   LUMBAR SPECIAL TESTS:  Straight leg raise test: Positive, FABER test: Positive, and Trendelenburg sign: Positive  FUNCTIONAL TESTS:  5 times sit to stand: 17.2s 01/26/23: 5x STS at bench  13.9s  02/23/23: 5x STS at bench  12.5s    GAIT: Distance walked: 76ft Assistive device utilized: None Level of assistance: Complete Independence Comments: L antalgic gait due to booka  TODAY'S TREATMENT:  DATE:  03/21/23  Pt seen for aquatic therapy today.  Treatment took place in water 3.5-4.75 ft in depth at the [facility] pool. Temp of water was 91.  Pt entered/exited the pool via stairs independently with bilat rail.   * unsupported walking forward, backward and side stepping *L stretch x 3; L stretch with tail wagging *"Old man stretch" x 3 *hip hinges 2 x 8 *Rainbow hb carry for core engagement forward and back bilat ue HB x 2 widths each; unilateral HB carry *Bow and arrow R/L x10 alternating. *TrA sets: solid noodle pull down staggered stance then wide stance x 8 reps ea.  Good execution  maintains abd bracing throughout. Instruction and trials of thicker noodles and depths * straddling yellow noodle with and without yellow hand floats (for breast strike arms): cycling ; hip add/abd; cross country ski LEs  *Walking forward and back between exercises for recovery  Pt requires the buoyancy and hydrostatic pressure of water for support, and to offload joints by unweighting joint load by at least 50 % in navel deep water and by at least 75-80% in chest to neck deep water.  Viscosity of the water is needed for resistance of strengthening. Water current perturbations provides challenge to standing balance requiring increased core activation.  02/24/23  Pt seen for aquatic therapy today.  Treatment took place in water 3.5-4.75 ft in depth at the [facility] pool. Temp of water was 91.  Pt entered/exited the pool via stairs independently with bilat rail.   * unsupported submerged to mid thoracic areawater : walking forward/backward with reciprocal arm swing and cues to allow Rt knee to flex during swing through STS from 3rd step unsupported x 6 vc for positioning and execution *hip hinges x 8 *STS from 3rd step x 5 then 4th step. Proper execution with good hip hinging *Bow and arrow R/L x10 alternating, improved execution *Green HB ue oscillations in sagittal plane wide stance then left foot forward staggered (leading right foot not tolerated due to increased right shoulder discomfort. * straddling yellow noodle with and without yellow hand floats (for breast strike arms): cycling ; hip add/abd; cross country ski LEs  *Walking forward and back between exercises for recovery  Pt requires the buoyancy and hydrostatic pressure of water for support, and to offload joints by unweighting joint load by at least 50 % in navel deep water and by at least 75-80% in chest to neck deep water.  Viscosity of the water is needed for resistance of strengthening. Water current perturbations  provides challenge to standing balance requiring increased core activation.   7/18  Supine piriformis stretch 30s 3x   Bridge with RTB at knees 4x8  Standing SB 2x10 small ROM YTB paloff press 2x10 each way  Edu about exam findings, progression of exercise, POC, HEP    PATIENT EDUCATION:  Education details: aquatic therapy progressions and modifications   Person educated: Patient Education method: Explanation, Demonstration, Tactile cues, Verbal cues,  Education comprehension: verbalized understanding, returned demonstration, verbal cues required, tactile cues required, and needs further education  HOME EXERCISE PROGRAM:  Access Code: [facility] URL: [email or web address] Date: 01/03/2023 Prepared by: Zebedee Iba  Access Code: [email or web address] URL: [email or web address] Date: 03/28/2023 Prepared by: Geni Bers  Exercises - Hand Buoy Carry  - 1 x daily - 7 x weekly - 3 sets - 10 reps - Side lunge with hand buoys  - 1 x daily - 7 x weekly - 3 sets - 10 reps - Standing '  L' Stretch at [employer]  - 1 x daily - 7 x weekly - 3 sets - 10 reps - Standing Hip Hinge  - 1 x daily - 7 x weekly - 3 sets - 10 reps - Drawing Bow  - 1 x daily - 7 x weekly - 3 sets - 10 reps - Noodle press  - 1 x daily - 7 x weekly - 3 sets - 10 reps - Seated Straddle on Flotation Forward Breast Stroke Arms and Bicycle Legs  - 1 x daily - 7 x weekly - 3 sets - 10 reps - Plank on Long Hand Float  - 1 x daily - 7 x weekly - 3 sets - 10 reps - Plank on Long [employer] with Leg Lift  - 1 x daily - 7 x weekly - 3 sets - 10 reps - Plank on Long [employer] with Arm Lifts  - 1 x daily - 7 x weekly - 3 sets - 10 reps    ASSESSMENT:  CLINICAL IMPRESSION: Discussed with pt return to land based intervention mainly if not exclusively as she will benefit from instruction on exercises and stretching techniques with normal loading (added as tolerated).  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  She reports "bow and arrow" relieves some LBP today.  Pt instructed on exercises placed on HEP that will be laminated and issued next visit. Pain reduction after session to 4/10. Goals ongoing        FROM EVAL: Patient is a 62 y.o. female who was seen today for physical therapy evaluation and treatment for c/c of chronic LBP. Pt's s/s appear consistent with lumbar radiculopathy and complex presentation given long standing history of LBP, multiple surgeries, as well as several concomitant pain syndromes contributing to her chronic pain. Pt's pain is highly sensitive and irritable with movement. Pt's is more pain dominant at this time. Plan to continue with aquatic therapy and land therapy for lumbar mobility and general functional mobility/LE strength at future sessions. Plan for progressive strengthening as pt's previous PT episodes did not target resistance training. Pt would benefit from continued skilled therapy in order to reach goals and maximize functional lumbopelvic strength and ROM for prevention of further functional decline.    OBJECTIVE IMPAIRMENTS: decreased activity tolerance, decreased endurance, decreased mobility, decreased ROM, decreased strength, hypomobility, increased muscle spasms, impaired flexibility, improper body mechanics, postural dysfunction, and pain.   ACTIVITY LIMITATIONS: carrying, lifting, sleeping, transfers, dressing, and bending, squatting  PARTICIPATION LIMITATIONS: cleaning, laundry, yard work, community activity, and exercise  PERSONAL FACTORS: Age, Fitness, Time since onset of injury/illness/exacerbation, and 3+ comorbidities:  are also affecting patient's functional outcome.   REHAB POTENTIAL: Fair  CLINICAL DECISION MAKING: unstable/complicated  EVALUATION COMPLEXITY: Moderate   SHORT TERM GOALS: Target date: 02/14/2023   Pt will become independent with HEP in order  to demonstrate synthesis of PT education.   Goal status: INITIAL  2.  Pt will report at least 2 pt reduction on NPRS scale for pain in order to demonstrate functional improvement with household activity, self care, and ADL.   Goal status: INITIAL  3. Pt will score at least 9 pt increase on FOTO to demonstrate functional improvement in MCII and pt perceived function.      Goal status:MET - 01/26/23  LONG TERM GOALS: Target date: 03/28/2023    Pt  will become independent with final HEP in order to demonstrate  synthesis of PT education.  Baseline:  Goal status: INITIAL  2. Pt will score >/= 40 on FOTO to demonstrate improvement in perceived lumbar function.  Baseline:  Goal status: MET- 01/26/23  3.  Pt will be able to perform 5XSTS in under 12s  in order to demonstrate functional improvement above the cut off score for adults.  Goal status: IN PROGRESS  -01/26/23  4.  Pt will be able to demonstrate/report ability to walk >15 mins without pain in order to demonstrate functional improvement and tolerance to exercise and community mobility.   Goal status: Ongoing PLAN:  PT FREQUENCY: 1-2x/week  PT DURATION: 12 weeks (likely to D/C by 8 with independent management)  PLANNED INTERVENTIONS: Therapeutic exercises, Therapeutic activity, Neuromuscular re-education, Balance training, Gait training, Patient/Family education, Self Care, Joint mobilization, Joint manipulation, Orthotic/Fit training, DME instructions, Aquatic Therapy, Dry Needling, Electrical stimulation, Spinal manipulation, Spinal mobilization, Cryotherapy, Moist heat, scar mobilization, Splintting, Taping, Vasopneumatic device, Traction, Ultrasound, Ionotophoresis 4mg /ml Dexamethasone, Manual therapy, and Re-evaluation.  PLAN FOR NEXT SESSION: aquatic to start for lumbar mobility, pain reduction with movement, and general LE strength; progress to land based strengthening/lifting  Corrie Dandy Tomma Lightning) Jackeline Gutknecht MPT 03/28/23 9:21  AM

## 2023-03-29 ENCOUNTER — Ambulatory Visit: Payer: Medicare Other | Admitting: Family Medicine

## 2023-03-29 ENCOUNTER — Encounter: Payer: Self-pay | Admitting: Family Medicine

## 2023-03-29 DIAGNOSIS — G43709 Chronic migraine without aura, not intractable, without status migrainosus: Secondary | ICD-10-CM | POA: Diagnosis not present

## 2023-03-29 MED ORDER — ONABOTULINUMTOXINA 200 UNITS IJ SOLR
155.0000 [IU] | Freq: Once | INTRAMUSCULAR | Status: AC
  Administered 2023-03-29: 155 [IU] via INTRAMUSCULAR

## 2023-03-29 NOTE — Progress Notes (Signed)
Botox- 200 units x 1 vial Lot: [id number] Expiration: 12/206 NDC: 4098-1191-47  Bacteriostatic 0.9% Sodium Chloride- * mL  Lot: [id number] Expiration:11/07/2023 ZHY:8657-8469-62  Dx: G43.709 S/P Witnessed by April Jones, RN

## 2023-03-30 ENCOUNTER — Encounter (HOSPITAL_BASED_OUTPATIENT_CLINIC_OR_DEPARTMENT_OTHER): Payer: Self-pay | Admitting: Physical Therapy

## 2023-03-30 ENCOUNTER — Ambulatory Visit (HOSPITAL_BASED_OUTPATIENT_CLINIC_OR_DEPARTMENT_OTHER): Payer: Medicare Other | Admitting: Physical Therapy

## 2023-03-30 DIAGNOSIS — I10 Essential (primary) hypertension: Secondary | ICD-10-CM | POA: Diagnosis not present

## 2023-03-30 DIAGNOSIS — E669 Obesity, unspecified: Secondary | ICD-10-CM | POA: Diagnosis not present

## 2023-03-30 DIAGNOSIS — M545 Low back pain, unspecified: Secondary | ICD-10-CM | POA: Diagnosis not present

## 2023-03-30 DIAGNOSIS — Z6833 Body mass index (BMI) 33.0-33.9, adult: Secondary | ICD-10-CM | POA: Diagnosis not present

## 2023-03-30 DIAGNOSIS — Z0000 Encounter for general adult medical examination without abnormal findings: Secondary | ICD-10-CM | POA: Diagnosis not present

## 2023-03-30 DIAGNOSIS — R262 Difficulty in walking, not elsewhere classified: Secondary | ICD-10-CM

## 2023-03-30 DIAGNOSIS — M6281 Muscle weakness (generalized): Secondary | ICD-10-CM

## 2023-03-30 DIAGNOSIS — E6609 Other obesity due to excess calories: Secondary | ICD-10-CM | POA: Diagnosis not present

## 2023-03-30 DIAGNOSIS — R1011 Right upper quadrant pain: Secondary | ICD-10-CM | POA: Diagnosis not present

## 2023-03-30 NOTE — Therapy (Signed)
OUTPATIENT PHYSICAL THERAPY THORACOLUMBAR TREATMENT   Patient Name: Meryam Cradle MRN: [id number] DOB:[birth year], 62 y.o., female Today's Date: 03/30/2023  END OF SESSION:  PT End of Session - 03/30/23 0938     Visit Number 12    Number of Visits 16    Date for PT Re-Evaluation 04/03/23    Authorization Type MCR    PT Start Time 0934    PT Stop Time 1015    PT Time Calculation (min) 41 min    Activity Tolerance Patient tolerated treatment well;Other (comment)    Behavior During Therapy WFL for tasks assessed/performed              Past Medical History:  Diagnosis Date   ADHD    Arthritis 2010   Asthma    Back pain    Chronic headaches    Chronic pain    Constipation    Edema of both lower extremities    Family history of breast cancer    Family history of prostate cancer    Gallstones 2018   GERD (gastroesophageal reflux disease)    High cholesterol    History of kidney stones    Hx: UTI (urinary tract infection)    Hypertension    IBS (irritable bowel syndrome)    Interstitial cystitis 2012   Joint pain    Kidney problem    Migraine    Occipital neuralgia    Osteoarthritis    PNA (pneumonia) 2018   Pre-diabetes    Spinal stenosis    Trigeminal neuralgia    Past Surgical History:  Procedure Laterality Date   ABDOMINAL HYSTERECTOMY     ANKLE SURGERY Left 05/2009   APPENDECTOMY     Benign Tumor Removal Right 01/26/2017   right upper arm by Dr. Benard Rink at [facility] [place]   BRAIN SURGERY     CARPAL TUNNEL RELEASE Right 2014   CHOLECYSTECTOMY N/A 12/24/2020   Procedure: LAPAROSCOPIC CHOLECYSTECTOMY;  Surgeon: Abigail Miyamoto, MD;  Location: [facility] OR;  Service: General;  Laterality: N/A;   COLONOSCOPY     CYSTOSCOPY W/ URETERAL STENT PLACEMENT Left 07/25/2019   Procedure: CYSTOSCOPY WITH RETROGRADE PYELOGRAM/URETERAL STENT PLACEMENT;  Surgeon: Marcine Matar, MD;  Location: [facility] ORS;  Service: Urology;  Laterality: Left;   CYSTOSCOPY W/  URETERAL STENT REMOVAL Left 08/15/2019   Procedure: CYSTOSCOPY WITH STENT REMOVAL;  Surgeon: Marcine Matar, MD;  Location: [facility];  Service: Urology;  Laterality: Left;   CYSTOSCOPY WITH RETROGRADE PYELOGRAM, URETEROSCOPY AND STENT PLACEMENT Left 08/15/2019   Procedure: CYSTOSCOPY WITH RETROGRADE PYELOGRAM, URETEROSCOPY;  Surgeon: Marcine Matar, MD;  Location: [facility];  Service: Urology;  Laterality: Left;  90 MINS   CYSTOSCOPY WITH RETROGRADE PYELOGRAM, URETEROSCOPY AND STENT PLACEMENT Right 09/12/2019   Procedure: CYSTOSCOPY WITH RIGHT RETROGRADE PYELOGRAM  URETEROSCOPY WITH HOLMIUM LASER STONE EXTRACTION AND STENT PLACEMENT;  Surgeon: Marcine Matar, MD;  Location: [facility];  Service: Urology;  Laterality: Right;   DILATION AND CURETTAGE OF UTERUS     HOLMIUM LASER APPLICATION Left 08/15/2019   Procedure: HOLMIUM LASER APPLICATION;  Surgeon: Marcine Matar, MD;  Location: [facility];  Service: Urology;  Laterality: Left;   JOINT REPLACEMENT     R knee   KIDNEY STONE SURGERY     neuro spine similator  01/2021   OVARIAN CYST REMOVAL  05/2010   right knee revision  2016   ROTATOR CUFF REPAIR Right 02/2019   SPINE SURGERY     x 3   TUBAL LIGATION  Patient Active Problem List   Diagnosis Date Noted   Gastroesophageal reflux disease 12/18/2022   Spinal stenosis 12/18/2022   Obesity: Starting BMI 33.3 11/09/2022   BMI 30.0-30.9,adult Current BMI 30.9 11/09/2022   Hypertension, essential 07/26/2022   Class 1 obesity with serious comorbidity and body mass index (BMI) of 33.0 to 33.9 in adult 06/23/2022   Mood disorder (HCC)- with emotional eating 05/23/2022   Mixed hyperlipidemia 05/23/2022   Prediabetes 04/26/2022   Depression 04/26/2022   Allergic rhinitis 01/26/2022   Morbid obesity (HCC) 09/24/2021   Exertional chest pain 09/24/2021   S/P laparoscopic cholecystectomy 12/24/2020   RUQ abdominal pain 12/25/2019   Decreased range of knee movement 09/02/2019   Congenital  cystic kidney disease 09/02/2019   Irritable bowel syndrome with constipation 09/02/2019   Calculus, ureter 07/24/2019   Chronic migraine without aura, with intractable migraine, so stated, with status migrainosus 05/30/2019   Chronic asthmatic bronchitis 05/14/2019   Excessive daytime sleepiness 01/08/2019   Upper airway cough syndrome 11/12/2018   Acute bacterial sinusitis 07/17/2018   Abnormal chest CT 07/17/2018   Trigeminal neuralgia of right side of face 02/01/2018   Occipital neuralgia 02/01/2018   Chronic migraine without aura without status migrainosus, not intractable 02/01/2018   Decreased strength 05/10/2017   Family history of breast cancer    Family history of prostate cancer    Chronic left shoulder pain 09/02/2015   Carpal tunnel syndrome 08/25/2011   Asthma 04/14/2009   Essential hypertension 04/14/2009   Hyperlipidemia 04/14/2009    PCP: Woodfin Ganja, MD   REFERRING PROVIDER: Venita Lick, MD  REFERRING DIAG:  Diagnosis  M54.59 (ICD-10-CM) - Other low back pain    Rationale for Evaluation and Treatment: Rehabilitation  THERAPY DIAG:  Pain, lumbar region  Muscle weakness (generalized)  Difficulty walking  ONSET DATE: March 2024  SUBJECTIVE:                                                                                                                                                                                           SUBJECTIVE STATEMENT: Ablation helped a little so far.  I did test it yesterday and worked which increased the pain some  Pt states the back feels better but does continue to have pain and difficulty with lifting. Pt had RFA recently and ablation scheduled for 8/14 for L4. Pt is in the CAM boot for the L foot. She has seen podiatrist and is in boot for the next 7 wks. Pt believes that aquatics has helped a lot. Pt is seeing chiro for her neck and is interested in PT for the neck at some  point. 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   PERTINENT HISTORY:  2002 lumar micro discectomy  2007 C6-7 Fusion 2014 L5-S1 fusion  2015-2016 R knee TKA and revision  2022 lumbar nerve stimulator  PAIN:  Are you having pain? Yes: NPRS scale: 6/10 Pain location: localized to L lumbar  Pain description: achy, sharp, "rod pushing through my back," stairs Aggravating factors: any bending, lifting, standing Relieving factors: No relief  PRECAUTIONS: nerve stimulator  WEIGHT BEARING RESTRICTIONS: No  FALLS:  Has patient fallen in last 6 months? Yes. 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 At L3-4 there is a broad-based disc bulge. Severe bilateral facet arthropathy. Moderate spinal stenosis. Bilateral subarticular recess stenosis. Moderate left foraminal stenosis. Moderate-severe right foraminal stenosis. 2. At L4-5 there is a broad-based disc bulge. Moderate bilateral facet arthropathy with bilateral mild facet effusions. Severe spinal stenosis. Severe right foraminal stenosis. Mild-moderate left foraminal stenosis. 3. Posterior lumbar fusion at L5-S1 with a broad-based disc bulge. No spinal stenosis. Mild bilateral foraminal stenosis. 4. No acute osseous injury of the lumbar spine.   Fusion hardware noted at L5-S1 no abnormalities noted adjacent segment disease noted at L4-5 and L3-4.    PATIENT SURVEYS:  FOTO 32 40 @ DC 9 MCII  01/26/23:  FOTO 47 7/18 52 FOTO GOAL MET  SCREENING FOR RED FLAGS: Bowel or  bladder incontinence: No Spinal tumors: No Cauda equina syndrome: No Compression fracture: No Abdominal aneurysm: No  COGNITION: Overall cognitive status: Within functional limits for tasks assessed     SENSATION: Light touch: Impaired   POSTURE: rounded shoulders and decreased lumbar lordosis  PALPATION: TTP of L lumbar paraspinals and L QL; hypertonicity of bilat lumbar paraspinals and QL  LUMBAR ROM:   AROM 7/18  Flexion 60% p! On L   Extension 50% R sided p!  Right lateral flexion 50% p!  Left lateral flexion 50%  Right rotation 75%  L p!  Left rotation 60%   (Blank rows = not tested)   LOWER EXTREMITY MMT:  4/5 throughout with mild pain into low back   LUMBAR SPECIAL TESTS:  Straight leg raise test: Positive, FABER test: Positive, and Trendelenburg sign: Positive  FUNCTIONAL TESTS:  5 times sit to stand: 17.2s 01/26/23: 5x STS at bench  13.9s  02/23/23: 5x STS at bench  12.5s    GAIT: Distance walked: 71ft Assistive device utilized: None Level of assistance: Complete Independence Comments: L antalgic gait due to booka  TODAY'S TREATMENT:  DATE:  8/22  Trigger Point Dry-Needling  Treatment instructions: Expect mild to moderate muscle soreness. S/S of pneumothorax if dry needled over a lung field, and to seek immediate medical attention should they occur. Patient verbalized understanding of these instructions and education.  Patient Consent Given: Yes Education handout provided: Yes Muscles treated: bilateral gluteal 1x on the right 2x on the left using a .30x75 needle Electrical stimulation performed: No Parameters: N/A Treatment response/outcome: great twitch care taken on the right to stay away from stimulator.   Manual: trigger point release to gluteal bilateral; skilled palpation of trigger points   Seated clamshell 2x15 red  with cuing for sets /reps/RPE   Reviewed self soft tissue mobilization using the thera-can and the tennis ball    03/21/23  Pt seen for aquatic therapy today.  Treatment took place in water 3.5-4.75 ft in depth at the [facility] pool. Temp of water was 91.  Pt entered/exited the pool via stairs independently with bilat rail.   * unsupported walking forward, backward and side stepping *L stretch x 3; L stretch with tail wagging *"Old man stretch" x 3 *hip hinges 2 x 8 *Rainbow hb carry for core engagement forward and back bilat ue HB x 2 widths each; unilateral HB carry *Bow and arrow R/L x10 alternating. *TrA sets: solid noodle pull down staggered stance then wide stance x 8 reps ea.  Good execution maintains abd bracing throughout. Instruction and trials of thicker noodles and depths * straddling yellow noodle with and without yellow hand floats (for breast strike arms): cycling ; hip add/abd; cross country ski LEs  *Walking forward and back between exercises for recovery  Pt requires the buoyancy and hydrostatic pressure of water for support, and to offload joints by unweighting joint load by at least 50 % in navel deep water and by at least 75-80% in chest to neck deep water.  Viscosity of the water is needed for resistance of strengthening. Water current perturbations provides challenge to standing balance requiring increased core activation.     PATIENT EDUCATION:  Education details: aquatic therapy progressions and modifications   Person educated: Patient Education method: Explanation, Demonstration, Tactile cues, Verbal cues,  Education comprehension: verbalized understanding, returned demonstration, verbal cues required, tactile cues required, and needs further education  HOME EXERCISE PROGRAM:  Access Code: [facility] URL: [email or web address] Date: 01/03/2023 Prepared by: Zebedee Iba  Access Code: [email or web address] URL:  [email or web address] Date: 03/28/2023 Prepared by: Geni Bers  Exercises - Hand Buoy Carry  - 1 x daily - 7 x weekly - 3 sets - 10 reps - Side lunge with hand buoys  - 1 x daily - 7 x weekly - 3 sets - 10 reps - Standing 'L' Stretch at [employer]  - 1 x daily - 7 x weekly - 3 sets - 10 reps - Standing Hip Hinge  - 1 x daily - 7 x weekly - 3 sets - 10 reps - Drawing Bow  - 1 x daily - 7 x weekly - 3 sets - 10 reps - Noodle press  - 1 x daily - 7 x weekly - 3 sets - 10 reps - Seated Straddle on Flotation Forward Breast Stroke Arms and Bicycle Legs  - 1 x daily - 7 x weekly - 3 sets - 10 reps - Plank on Long Hand Float  - 1 x daily - 7 x weekly - 3 sets - 10 reps - Plank on Long [employer] with Leg Lift  - 1  x daily - 7 x weekly - 3 sets - 10 reps - Plank on Long [employer] with Arm Lifts  - 1 x daily - 7 x weekly - 3 sets - 10 reps    ASSESSMENT:  CLINICAL IMPRESSION: The patient had a great twitch with needling. We were careful to avoid her stimulator on the right. We worked on trigger point release as well as self trigger point release. She will purchase a thera-can. She reported it helped her neck significantly as well. She reported less pain then when she came in. She was shown a seated clamshell to do at home. She reports she can not do the supine exercises on her bed and has increased pain getting up and down off the floor. She was given a seated exercise instead. Therapy will continue to progress        FROM EVAL: Patient is a 62 y.o. female who was seen today for physical therapy evaluation and treatment for c/c of chronic LBP. Pt's s/s appear consistent with lumbar radiculopathy and complex presentation given long standing history of LBP, multiple surgeries, as well as several concomitant pain syndromes contributing to her chronic pain. Pt's pain is highly sensitive and irritable with movement. Pt's is more pain dominant at this time. Plan to continue with  aquatic therapy and land therapy for lumbar mobility and general functional mobility/LE strength at future sessions. Plan for progressive strengthening as pt's previous PT episodes did not target resistance training. Pt would benefit from continued skilled therapy in order to reach goals and maximize functional lumbopelvic strength and ROM for prevention of further functional decline.    OBJECTIVE IMPAIRMENTS: decreased activity tolerance, decreased endurance, decreased mobility, decreased ROM, decreased strength, hypomobility, increased muscle spasms, impaired flexibility, improper body mechanics, postural dysfunction, and pain.   ACTIVITY LIMITATIONS: carrying, lifting, sleeping, transfers, dressing, and bending, squatting  PARTICIPATION LIMITATIONS: cleaning, laundry, yard work, community activity, and exercise  PERSONAL FACTORS: Age, Fitness, Time since onset of injury/illness/exacerbation, and 3+ comorbidities:  are also affecting patient's functional outcome.   REHAB POTENTIAL: Fair  CLINICAL DECISION MAKING: unstable/complicated  EVALUATION COMPLEXITY: Moderate   SHORT TERM GOALS: Target date: 02/14/2023   Pt will become independent with HEP in order to demonstrate synthesis of PT education.   Goal status: INITIAL  2.  Pt will report at least 2 pt reduction on NPRS scale for pain in order to demonstrate functional improvement with household activity, self care, and ADL.   Goal status: INITIAL  3. Pt will score at least 9 pt increase on FOTO to demonstrate functional improvement in MCII and pt perceived function.      Goal status:MET - 01/26/23  LONG TERM GOALS: Target date: 03/28/2023    Pt  will become independent with final HEP in order to demonstrate synthesis of PT education.  Baseline:  Goal status: INITIAL  2. Pt will score >/= 40 on FOTO to demonstrate improvement in perceived lumbar function.  Baseline:  Goal status: MET- 01/26/23  3.  Pt will be able to perform  5XSTS in under 12s  in order to demonstrate functional improvement above the cut off score for adults.  Goal status: IN PROGRESS  -01/26/23  4.  Pt will be able to demonstrate/report ability to walk >15 mins without pain in order to demonstrate functional improvement and tolerance to exercise and community mobility.   Goal status: Ongoing PLAN:  PT FREQUENCY: 1-2x/week  PT DURATION: 12 weeks (likely to D/C by 8 with independent  management)  PLANNED INTERVENTIONS: Therapeutic exercises, Therapeutic activity, Neuromuscular re-education, Balance training, Gait training, Patient/Family education, Self Care, Joint mobilization, Joint manipulation, Orthotic/Fit training, DME instructions, Aquatic Therapy, Dry Needling, Electrical stimulation, Spinal manipulation, Spinal mobilization, Cryotherapy, Moist heat, scar mobilization, Splintting, Taping, Vasopneumatic device, Traction, Ultrasound, Ionotophoresis 4mg /ml Dexamethasone, Manual therapy, and Re-evaluation.  PLAN FOR NEXT SESSION: aquatic to start for lumbar mobility, pain reduction with movement, and general LE strength; progress to land based strengthening/lifting  Corrie Dandy Tomma Lightning) Ziemba MPT 03/30/23 11:47 AM

## 2023-03-31 ENCOUNTER — Emergency Department (HOSPITAL_BASED_OUTPATIENT_CLINIC_OR_DEPARTMENT_OTHER)
Admission: EM | Admit: 2023-03-31 | Discharge: 2023-04-01 | Disposition: A | Discharge status: Home / Self Care | Payer: Medicare Other | Attending: Emergency Medicine | Admitting: Emergency Medicine

## 2023-03-31 ENCOUNTER — Encounter (HOSPITAL_BASED_OUTPATIENT_CLINIC_OR_DEPARTMENT_OTHER): Payer: Self-pay

## 2023-03-31 ENCOUNTER — Emergency Department (HOSPITAL_BASED_OUTPATIENT_CLINIC_OR_DEPARTMENT_OTHER): Payer: Medicare Other

## 2023-03-31 ENCOUNTER — Other Ambulatory Visit: Payer: Self-pay

## 2023-03-31 DIAGNOSIS — Z9049 Acquired absence of other specified parts of digestive tract: Secondary | ICD-10-CM | POA: Diagnosis not present

## 2023-03-31 DIAGNOSIS — Z91040 Latex allergy status: Secondary | ICD-10-CM | POA: Diagnosis not present

## 2023-03-31 DIAGNOSIS — Z79899 Other long term (current) drug therapy: Secondary | ICD-10-CM | POA: Insufficient documentation

## 2023-03-31 DIAGNOSIS — I10 Essential (primary) hypertension: Secondary | ICD-10-CM | POA: Insufficient documentation

## 2023-03-31 DIAGNOSIS — R1031 Right lower quadrant pain: Secondary | ICD-10-CM | POA: Diagnosis not present

## 2023-03-31 DIAGNOSIS — N200 Calculus of kidney: Secondary | ICD-10-CM | POA: Diagnosis not present

## 2023-03-31 DIAGNOSIS — R109 Unspecified abdominal pain: Secondary | ICD-10-CM | POA: Diagnosis not present

## 2023-03-31 DIAGNOSIS — M546 Pain in thoracic spine: Secondary | ICD-10-CM | POA: Insufficient documentation

## 2023-03-31 DIAGNOSIS — M549 Dorsalgia, unspecified: Secondary | ICD-10-CM | POA: Diagnosis present

## 2023-03-31 LAB — CBC WITH DIFFERENTIAL/PLATELET
Abs Immature Granulocytes: 0.01 10*3/uL (ref 0.00–0.07)
Basophils Absolute: 0.1 10*3/uL (ref 0.0–0.1)
Basophils Relative: 1 %
Eosinophils Absolute: 0.2 10*3/uL (ref 0.0–0.5)
Eosinophils Relative: 3 %
HCT: 40.3 % (ref 36.0–46.0)
Hemoglobin: 13.9 g/dL (ref 12.0–15.0)
Immature Granulocytes: 0 %
Lymphocytes Relative: 41 %
Lymphs Abs: 3.1 10*3/uL (ref 0.7–4.0)
MCH: 33.1 pg (ref 26.0–34.0)
MCHC: 34.5 g/dL (ref 30.0–36.0)
MCV: 96 fL (ref 80.0–100.0)
Monocytes Absolute: 0.6 10*3/uL (ref 0.1–1.0)
Monocytes Relative: 7 %
Neutro Abs: 3.6 10*3/uL (ref 1.7–7.7)
Neutrophils Relative %: 48 %
Platelets: 342 10*3/uL (ref 150–400)
RBC: 4.2 MIL/uL (ref 3.87–5.11)
RDW: 12.6 % (ref 11.5–15.5)
WBC: 7.5 10*3/uL (ref 4.0–10.5)
nRBC: 0 % (ref 0.0–0.2)

## 2023-03-31 LAB — COMPREHENSIVE METABOLIC PANEL
ALT: 26 U/L (ref 0–44)
AST: 28 U/L (ref 15–41)
Albumin: 4.7 g/dL (ref 3.5–5.0)
Alkaline Phosphatase: 54 U/L (ref 38–126)
Anion gap: 10 (ref 5–15)
BUN: 15 mg/dL (ref 8–23)
CO2: 28 mmol/L (ref 22–32)
Calcium: 10.4 mg/dL — ABNORMAL HIGH (ref 8.9–10.3)
Chloride: 102 mmol/L (ref 98–111)
Creatinine, Ser: 1 mg/dL (ref 0.44–1.00)
GFR, Estimated: >60 mL/min (ref >60)
Glucose, Bld: 86 mg/dL (ref 70–99)
Potassium: 4.2 mmol/L (ref 3.5–5.1)
Sodium: 140 mmol/L (ref 135–145)
Total Bilirubin: 0.4 mg/dL (ref 0.3–1.2)
Total Protein: 7.3 g/dL (ref 6.5–8.1)

## 2023-03-31 LAB — URINALYSIS, ROUTINE W REFLEX MICROSCOPIC
Bilirubin Urine: NEGATIVE
Glucose, UA: NEGATIVE mg/dL
Hgb urine dipstick: NEGATIVE
Ketones, ur: NEGATIVE mg/dL
Leukocytes,Ua: NEGATIVE
Nitrite: NEGATIVE
Protein, ur: NEGATIVE mg/dL
Specific Gravity, Urine: <1.005 — ABNORMAL LOW (ref 1.005–1.030)
pH: 7 (ref 5.0–8.0)

## 2023-03-31 LAB — LIPASE, BLOOD: Lipase: 65 U/L — ABNORMAL HIGH (ref 11–51)

## 2023-03-31 MED ORDER — HYDROMORPHONE HCL 1 MG/ML IJ SOLN
1.0000 mg | Freq: Once | INTRAMUSCULAR | Status: AC
  Administered 2023-03-31: 1 mg via INTRAMUSCULAR
  Filled 2023-03-31: qty 1

## 2023-03-31 MED ORDER — ONDANSETRON 4 MG PO TBDP
4.0000 mg | ORAL_TABLET | Freq: Once | ORAL | Status: AC
  Administered 2023-03-31: 4 mg via ORAL
  Filled 2023-03-31: qty 1

## 2023-03-31 MED ORDER — OXYCODONE-ACETAMINOPHEN 5-325 MG PO TABS: 1.0000 | ORAL_TABLET | Freq: Four times a day (QID) | ORAL | 0 refills | Status: DC | PRN

## 2023-03-31 MED ORDER — MORPHINE SULFATE (PF) 4 MG/ML IV SOLN
6.0000 mg | Freq: Once | INTRAVENOUS | Status: AC
  Administered 2023-04-01: 6 mg via INTRAVENOUS
  Filled 2023-03-31: qty 2

## 2023-03-31 MED ORDER — ALUM & MAG HYDROXIDE-SIMETH 200-200-20 MG/5ML PO SUSP
30.0000 mL | Freq: Once | ORAL | Status: AC
  Administered 2023-03-31: 30 mL via ORAL
  Filled 2023-03-31: qty 30

## 2023-03-31 NOTE — ED Triage Notes (Signed)
Pt c/o abd RUQ and RLQ for the past 6 days. Now pain is spreading to right flank. Pt pain is worse with urination. Denies blood in urine. Pain is 7/10. Denies NVD. LBM 2 days ago and was small and hard

## 2023-03-31 NOTE — Discharge Instructions (Addendum)
The scans today did not show any signs of kidney stone or bowel issues.  Might be coming from your back.  Take the pain medicine as needed but you can continue Tylenol.  You may want to try a lidocaine patch or muscle rubs.  If you start having fever, shortness of breath, vomiting

## 2023-03-31 NOTE — ED Provider Notes (Signed)
[place] EMERGENCY DEPARTMENT AT [facility] Provider Note   CSN: [id number] Arrival date & time: 03/31/23  2018     History  Chief Complaint  Patient presents with   Abdominal Pain    Cassidy Olsen is a 62 y.o. female.  Patient is a 62 year old female with a history of spinal stenosis with chronic back pain, hypertension, prior kidney stones requiring stents, status post cholecystectomy who is presenting today with complaint of right-sided pain.  She reports this pain has been present for the last 6 days and has been gradually worsening.  It is in her back and wraps around to her side.  It is worse with any movements, she feels like it is a little bit worse with eating and urinating as well.  She has had bowel movements but they have been small and hard recently.  She does not have any dysuria, frequency or urgency.  She denies any hematuria.  She has had no fever nausea or vomiting.  She has had no injury that she is aware about.  She has been taking some Tylenol at home but and even took 1 ibuprofen but it did not help with the pain.  She reports now any bumps she goes over in the road, cough, deep breath will cause the pain.  She has not been able to get comfortable.  The history is provided by the patient and medical records.  Abdominal Pain      Home Medications Prior to Admission medications   Medication Sig Start Date End Date Taking? Authorizing Provider  oxyCODONE-acetaminophen (PERCOCET/ROXICET) 5-325 MG tablet Take 1 tablet by mouth every 6 (six) hours as needed for severe pain. 03/31/23  Yes Taitum Alms, Alphonzo Lemmings, MD  ACETAMINOPHEN PO Take 1,300 mg by mouth 2 (two) times daily.    [provider]  albuterol (VENTOLIN HFA) 108 (90 Base) MCG/ACT inhaler Inhale 2 puffs into the lungs every 6 (six) hours as needed for wheezing or shortness of breath. 06/02/22   Icard, Rachel Bo, DO  AMBULATORY NON FORMULARY MEDICATION 1 tablet 2 (two) times daily. Medication  Name: Migrelief    [provider]  amitriptyline (ELAVIL) 50 MG tablet Take 1 tablet (50 mg total) by mouth at bedtime. 03/02/23   Lomax, Amy, NP  atorvastatin (LIPITOR) 20 MG tablet TAKE 1 TABLET BY MOUTH EVERY DAY 01/11/23   Runell Gess, MD  botulinum toxin Type A (BOTOX) 200 units injection INJECT 155 UNITS INTRAMUSCULARLY EVERY 12 WEEKS (GIVEN AT MD  OFFICE, DISCARD UNUSED) 12/27/22   Lomax, Amy, NP  budesonide-formoterol (SYMBICORT) 80-4.5 MCG/ACT inhaler Inhale 2 puffs into the lungs 2 (two) times daily as needed (asthma). 09/15/21   Josephine Igo, DO  celecoxib (CELEBREX) 200 MG capsule Take 200 mg by mouth 2 (two) times daily.    [provider]  dexlansoprazole (DEXILANT) 60 MG capsule Take 1 capsule by mouth daily. 09/19/22   [provider]  dicyclomine (BENTYL) 10 MG capsule Take 1 by mouth twice daily. Patient taking differently: Take 20 mg by mouth in the morning and at bedtime. PRN 08/31/22   Unk Lightning, PA  fluticasone (FLONASE) 50 MCG/ACT nasal spray Place 2 sprays into both nostrils daily. 06/08/20   Icard, Rachel Bo, DO  Fremanezumab-vfrm (AJOVY) 225 MG/1.5ML SOAJ Inject 225 mg into the skin every 30 (thirty) days. 05/03/22   Lomax, Amy, NP  gabapentin (NEURONTIN) 300 MG capsule Take 3 capsules (900 mg total) by mouth 3 (three) times daily. 05/24/22  Lomax, Amy, NP  lisinopril (ZESTRIL) 10 MG tablet Take 10 mg by mouth daily.    [provider]  lubiprostone (AMITIZA) 24 MCG capsule Take 1 capsule (24 mcg total) by mouth 2 (two) times daily with a meal. 08/31/22   Lemmon, Violet Baldy, PA  montelukast (SINGULAIR) 10 MG tablet TAKE 1 TABLET BY MOUTH EVERYDAY AT BEDTIME 09/19/22   Icard, Rachel Bo, DO  Omega-3 Fatty Acids (OMEGA III EPA+DHA) 1000 MG CAPS Take by mouth. 09/22/22   [provider]  Spacer/Aero-Holding Chambers (AEROCHAMBER MV) inhaler Use as instructed 04/18/18   Icard, Rachel Bo, DO  valACYclovir (VALTREX) 500  MG tablet Take 500 mg by mouth 2 (two) times daily as needed (outbreaks).     [provider]      Allergies    Aspirin, Ketorolac, Ketorolac tromethamine, Penicillins, Codeine, Ibuprofen, Latex, Omeprazole magnesium, Pantoprazole sodium, Penicillamine, Pregabalin, and Pyridium [phenazopyridine hcl]    Review of Systems   Review of Systems  Gastrointestinal:  Positive for abdominal pain.    Physical Exam Updated Vital Signs BP (!) 119/98   Pulse 94   Temp 98.2 F (36.8 C)   Resp 20   Ht 5\' 2"  (1.575 m)   Wt 81.6 kg   SpO2 98%   BMI 32.92 kg/m  Physical Exam Vitals and nursing note reviewed.  Constitutional:      General: She is not in acute distress.    Appearance: She is well-developed.  HENT:     Head: Normocephalic and atraumatic.  Eyes:     Pupils: Pupils are equal, round, and reactive to light.  Cardiovascular:     Rate and Rhythm: Normal rate and regular rhythm.     Heart sounds: Normal heart sounds. No murmur heard.    No friction rub.  Pulmonary:     Effort: Pulmonary effort is normal.     Breath sounds: Normal breath sounds. No wheezing or rales.  Abdominal:     General: Bowel sounds are normal. There is no distension.     Palpations: Abdomen is soft.     Tenderness: There is abdominal tenderness in the right lower quadrant. There is right CVA tenderness and guarding. There is no rebound.  Musculoskeletal:        General: No tenderness. Normal range of motion.     Comments: No edema  Skin:    General: Skin is warm and dry.     Findings: No rash.  Neurological:     Mental Status: She is alert and oriented to person, place, and time.     Cranial Nerves: No cranial nerve deficit.  Psychiatric:        Behavior: Behavior normal.     ED Results / Procedures / Treatments   Labs (all labs ordered are listed, but only abnormal results are displayed) Labs Reviewed  COMPREHENSIVE METABOLIC PANEL - Abnormal; Notable for the following components:       Result Value   Calcium 10.4 (*)    All other components within normal limits  LIPASE, BLOOD - Abnormal; Notable for the following components:   Lipase 65 (*)    All other components within normal limits  URINALYSIS, ROUTINE W REFLEX MICROSCOPIC - Abnormal; Notable for the following components:   Specific Gravity, Urine <1.005 (*)    All other components within normal limits  CBC WITH DIFFERENTIAL/PLATELET    EKG None  Radiology CT Renal Stone Study  Result Date: 03/31/2023 CLINICAL DATA:  Right abdominal/flank pain EXAM:  CT ABDOMEN AND PELVIS WITHOUT CONTRAST TECHNIQUE: Multidetector CT imaging of the abdomen and pelvis was performed following the standard protocol without IV contrast. RADIATION DOSE REDUCTION: This exam was performed according to the departmental dose-optimization program which includes automated exposure control, adjustment of the mA and/or kV according to patient size and/or use of iterative reconstruction technique. COMPARISON:  01/07/2020 FINDINGS: Lower chest: No acute abnormality Hepatobiliary: No focal liver abnormality is seen. Status post cholecystectomy. No biliary dilatation. Pancreas: No focal abnormality or ductal dilatation. Spleen: No focal abnormality.  Normal size. Adrenals/Urinary Tract: 3 mm nonobstructing stone in the midpole of the right kidney. No ureteral stones or hydronephrosis. Adrenal glands and urinary bladder unremarkable. Stomach/Bowel: Stomach, large and small bowel grossly unremarkable. Vascular/Lymphatic: No evidence of aneurysm or adenopathy. Reproductive: Prior hysterectomy.  No adnexal masses. Other: No free fluid or free air. Musculoskeletal: No acute bony abnormality. Postoperative changes in the lumbar spine. IMPRESSION: Small right midpole nonobstructing renal stone. No ureteral stones or hydronephrosis. No acute findings. Electronically Signed   By: Charlett Nose M.D.   On: 03/31/2023 23:20    Procedures Procedures    Medications  Ordered in ED Medications  morphine (PF) 4 MG/ML injection 6 mg (has no administration in time range)  HYDROmorphone (DILAUDID) injection 1 mg (1 mg Intramuscular Given 03/31/23 2257)  ondansetron (ZOFRAN-ODT) disintegrating tablet 4 mg (4 mg Oral Given 03/31/23 2257)  alum & mag hydroxide-simeth (MAALOX/MYLANTA) 200-200-20 MG/5ML suspension 30 mL (30 mLs Oral Given 03/31/23 2354)    ED Course/ Medical Decision Making/ A&P                                 Medical Decision Making Amount and/or Complexity of Data Reviewed Labs: ordered. Decision-making details documented in ED Course. Radiology: ordered and independent interpretation performed. Decision-making details documented in ED Course.  Risk OTC drugs. Prescription drug management.   Pt with multiple medical problems and comorbidities and presenting today with a complaint that caries a high risk for morbidity and mortality.  Here today with right sided pain.  Concern for renal stone versus appendicitis versus hepatitis versus atypical diverticulitis versus musculoskeletal.  No rashes to suggest zoster and patient does not have any respiratory symptoms to suggest pneumonia.  I independently interpreted patient's labs CMP, CBC, UA without acute findings.  Lipase is minimally elevated at 65.  CT to further evaluate is pending.  12:01 AM I have independently visualized and interpreted pt's images today.  Ct does not show acute issues today.  Concern that this may be MSK.   Discussed with the pt and currently will get to achieve some pain control.        Final Clinical Impression(s) / ED Diagnoses Final diagnoses:  Acute right-sided thoracic back pain    Rx / DC Orders ED Discharge Orders          Ordered    oxyCODONE-acetaminophen (PERCOCET/ROXICET) 5-325 MG tablet  Every 6 hours PRN        03/31/23 2354              Gwyneth Sprout, MD 04/01/23 0003

## 2023-03-31 NOTE — ED Notes (Signed)
Pt to CT

## 2023-04-01 DIAGNOSIS — M546 Pain in thoracic spine: Secondary | ICD-10-CM | POA: Diagnosis not present

## 2023-04-04 ENCOUNTER — Encounter (HOSPITAL_BASED_OUTPATIENT_CLINIC_OR_DEPARTMENT_OTHER): Payer: Self-pay | Admitting: Physical Therapy

## 2023-04-04 ENCOUNTER — Ambulatory Visit (HOSPITAL_BASED_OUTPATIENT_CLINIC_OR_DEPARTMENT_OTHER): Payer: Medicare Other | Admitting: Physical Therapy

## 2023-04-04 DIAGNOSIS — M545 Low back pain, unspecified: Secondary | ICD-10-CM

## 2023-04-04 DIAGNOSIS — M6281 Muscle weakness (generalized): Secondary | ICD-10-CM | POA: Diagnosis not present

## 2023-04-04 DIAGNOSIS — R262 Difficulty in walking, not elsewhere classified: Secondary | ICD-10-CM | POA: Diagnosis not present

## 2023-04-04 NOTE — Therapy (Signed)
OUTPATIENT PHYSICAL THERAPY THORACOLUMBAR TREATMENT/re-cert Progress Note Reporting Period 01/03/23 to 04/04/23  See note below for Objective Data and Assessment of Progress/Goals.       Patient Name: Cassidy Olsen MRN: [id number] DOB:12-Jan-1961, 62 y.o., female Today's Date: 04/04/2023  END OF SESSION:  PT End of Session - 04/04/23 1117     Visit Number 13    Number of Visits 17    Date for PT Re-Evaluation 04/21/23    Authorization Type MCR    PT Start Time 0947    PT Stop Time 1030    PT Time Calculation (min) 43 min    Activity Tolerance Patient tolerated treatment well;Other (comment)    Behavior During Therapy WFL for tasks assessed/performed               Past Medical History:  Diagnosis Date   ADHD    Arthritis 2010   Asthma    Back pain    Chronic headaches    Chronic pain    Constipation    Edema of both lower extremities    Family history of breast cancer    Family history of prostate cancer    Gallstones 2018   GERD (gastroesophageal reflux disease)    High cholesterol    History of kidney stones    Hx: UTI (urinary tract infection)    Hypertension    IBS (irritable bowel syndrome)    Interstitial cystitis 2012   Joint pain    Kidney problem    Migraine    Occipital neuralgia    Osteoarthritis    PNA (pneumonia) 2018   Pre-diabetes    Spinal stenosis    Trigeminal neuralgia    Past Surgical History:  Procedure Laterality Date   ABDOMINAL HYSTERECTOMY     ANKLE SURGERY Left 05/2009   APPENDECTOMY     Benign Tumor Removal Right 01/26/2017   right upper arm by Dr. Benard Rink at [facility] [place]   BRAIN SURGERY     CARPAL TUNNEL RELEASE Right 2014   CHOLECYSTECTOMY N/A 12/24/2020   Procedure: LAPAROSCOPIC CHOLECYSTECTOMY;  Surgeon: Abigail Miyamoto, MD;  Location: [facility] OR;  Service: General;  Laterality: N/A;   COLONOSCOPY     CYSTOSCOPY W/ URETERAL STENT PLACEMENT Left 07/25/2019   Procedure: CYSTOSCOPY WITH RETROGRADE  PYELOGRAM/URETERAL STENT PLACEMENT;  Surgeon: Marcine Matar, MD;  Location: [facility] ORS;  Service: Urology;  Laterality: Left;   CYSTOSCOPY W/ URETERAL STENT REMOVAL Left 08/15/2019   Procedure: CYSTOSCOPY WITH STENT REMOVAL;  Surgeon: Marcine Matar, MD;  Location: [facility];  Service: Urology;  Laterality: Left;   CYSTOSCOPY WITH RETROGRADE PYELOGRAM, URETEROSCOPY AND STENT PLACEMENT Left 08/15/2019   Procedure: CYSTOSCOPY WITH RETROGRADE PYELOGRAM, URETEROSCOPY;  Surgeon: Marcine Matar, MD;  Location: [facility];  Service: Urology;  Laterality: Left;  90 MINS   CYSTOSCOPY WITH RETROGRADE PYELOGRAM, URETEROSCOPY AND STENT PLACEMENT Right 09/12/2019   Procedure: CYSTOSCOPY WITH RIGHT RETROGRADE PYELOGRAM  URETEROSCOPY WITH HOLMIUM LASER STONE EXTRACTION AND STENT PLACEMENT;  Surgeon: Marcine Matar, MD;  Location: [facility];  Service: Urology;  Laterality: Right;   DILATION AND CURETTAGE OF UTERUS     HOLMIUM LASER APPLICATION Left 08/15/2019   Procedure: HOLMIUM LASER APPLICATION;  Surgeon: Marcine Matar, MD;  Location: [facility];  Service: Urology;  Laterality: Left;   JOINT REPLACEMENT     R knee   KIDNEY STONE SURGERY     neuro spine similator  01/2021   OVARIAN CYST REMOVAL  05/2010   right knee revision  2016   ROTATOR CUFF REPAIR Right 02/2019   SPINE SURGERY     x 3   TUBAL LIGATION     Patient Active Problem List   Diagnosis Date Noted   Gastroesophageal reflux disease 12/18/2022   Spinal stenosis 12/18/2022   Obesity: Starting BMI 33.3 11/09/2022   BMI 30.0-30.9,adult Current BMI 30.9 11/09/2022   Hypertension, essential 07/26/2022   Class 1 obesity with serious comorbidity and body mass index (BMI) of 33.0 to 33.9 in adult 06/23/2022   Mood disorder (HCC)- with emotional eating 05/23/2022   Mixed hyperlipidemia 05/23/2022   Prediabetes 04/26/2022   Depression 04/26/2022   Allergic rhinitis 01/26/2022   Morbid obesity (HCC) 09/24/2021   Exertional chest pain  09/24/2021   S/P laparoscopic cholecystectomy 12/24/2020   RUQ abdominal pain 12/25/2019   Decreased range of knee movement 09/02/2019   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   Irritable bowel syndrome with constipation 09/02/2019   Calculus, ureter 07/24/2019   Chronic migraine without aura, with intractable migraine, so stated, with status migrainosus 05/30/2019   Chronic asthmatic bronchitis 05/14/2019   Excessive daytime sleepiness 01/08/2019   Upper airway cough syndrome 11/12/2018   Acute bacterial sinusitis 07/17/2018   Abnormal chest CT 07/17/2018   Trigeminal neuralgia of right side of face 02/01/2018   Occipital neuralgia 02/01/2018   Chronic migraine without aura without status migrainosus, not intractable 02/01/2018   Decreased strength 05/10/2017   Family history of breast cancer    Family history of prostate cancer    Chronic left shoulder pain 09/02/2015   Carpal tunnel syndrome 08/25/2011   Asthma 04/14/2009   Essential hypertension 04/14/2009   Hyperlipidemia 04/14/2009    PCP: Woodfin Ganja, MD   REFERRING PROVIDER: Venita Lick, MD  REFERRING DIAG:  Diagnosis  M54.59 (ICD-10-CM) - Other low back pain    Rationale for Evaluation and Treatment: Rehabilitation  THERAPY DIAG:  Pain, lumbar region  Muscle weakness (generalized)  Difficulty walking  ONSET DATE: March 2024  SUBJECTIVE:                                                                                                                                                                                           SUBJECTIVE STATEMENT: Right side is hurting me today unusual. Ablation and DN have decreased muscle spasm. DN helped a lot.  ER other day pain across right side though maybe I had a repeat of kidney stones.  Pt states the back feels better but does continue to have pain and difficulty with lifting. Pt had RFA recently and ablation scheduled for 8/14 for L4. Pt is in the CAM boot  for the L foot. She has seen podiatrist and is in boot for the next 7 wks. Pt believes that aquatics has helped a lot. Pt is seeing chiro for her neck and is interested in PT for the neck at some point. 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   PERTINENT HISTORY:  2002 lumar micro discectomy  2007 C6-7 Fusion 2014 L5-S1 fusion  2015-2016 R knee TKA and revision  2022 lumbar nerve stimulator  PAIN:  Are you having pain? Yes: NPRS scale: 5/10 Pain location: localized to L lumbar  Pain description: achy, sharp, "rod pushing through my back," stairs Aggravating factors: any bending, lifting, standing Relieving factors: No relief  PRECAUTIONS: nerve stimulator  WEIGHT BEARING RESTRICTIONS: No  FALLS:  Has patient fallen in last 6 months? Yes. 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 At L3-4 there is a broad-based disc bulge. Severe bilateral facet arthropathy. Moderate spinal stenosis. Bilateral subarticular recess stenosis. Moderate left foraminal stenosis. Moderate-severe right foraminal stenosis. 2. At L4-5 there is a broad-based disc bulge. Moderate bilateral facet arthropathy with bilateral mild facet effusions. Severe spinal stenosis. Severe right foraminal stenosis. Mild-moderate left foraminal stenosis. 3. Posterior lumbar fusion at L5-S1 with a broad-based disc bulge. No spinal stenosis. Mild bilateral foraminal stenosis. 4. No acute osseous injury of the lumbar  spine.   Fusion hardware noted at L5-S1 no abnormalities noted adjacent segment disease noted at L4-5 and L3-4.    PATIENT SURVEYS:  FOTO 32 40 @ DC 9 MCII  01/26/23:  FOTO 47 7/18 52 FOTO GOAL MET  SCREENING FOR RED FLAGS: Bowel or bladder incontinence: No Spinal tumors: No Cauda equina syndrome: No Compression fracture: No Abdominal aneurysm: No  COGNITION: Overall cognitive status: Within functional limits for tasks assessed     SENSATION: Light touch: Impaired   POSTURE: rounded shoulders and decreased lumbar lordosis  PALPATION: TTP of L lumbar paraspinals and L QL; hypertonicity of bilat lumbar paraspinals and QL  LUMBAR ROM:   AROM 7/18  Flexion 60% p! On L   Extension 50% R sided p!  Right lateral flexion 50% p!  Left lateral flexion 50%  Right rotation 75%  L p!  Left rotation 60%   (Blank rows = not tested)   LOWER EXTREMITY MMT:  4/5 throughout with mild pain into low back   LUMBAR SPECIAL TESTS:  Straight leg raise test: Positive, FABER test: Positive, and Trendelenburg sign: Positive  FUNCTIONAL TESTS:  5 times sit to stand: 17.2s 01/26/23: 5x STS at bench  13.9s  02/23/23: 5x STS at bench  12.5s 04/04/23: 5x STS at bench   11.8s    GAIT: Distance walked: 67ft Assistive device utilized: None Level of assistance: Complete Independence Comments: L antalgic gait due to booka  TODAY'S TREATMENT:  DATE:  04/04/23  Pt seen for aquatic therapy today.  Treatment took place in water 3.5-4.75 ft in depth at the [facility] pool. Temp of water was 91.  Pt entered/exited the pool via stairs independently with bilat rail.   Exercises - Hand Buoy Carry   - Side lunge with hand buoys  - Standing 'L' Stretch at [employer]   - Standing Hip Hinge  - Drawing Bow  - Noodle press  - Seated Straddle on [employer]  Breast Stroke Arms and Bicycle Legs   - Plank on Long Hand Float   - Plank on Long [employer]ologist, sport and exercise with Leg Lift   - Plank on Long Psychologist, sport and exercise with Arm Lifts x 5  Pt requires the buoyancy and hydrostatic pressure of water for support, and to offload joints by unweighting joint load by at least 50 % in navel deep water and by at least 75-80% in chest to neck deep water.  Viscosity of the water is needed for resistance of strengthening. Water current perturbations provides challenge to standing balance requiring increased core activation.   8/22  Trigger Point Dry-Needling  Treatment instructions: Expect mild to moderate muscle soreness. S/S of pneumothorax if dry needled over a lung field, and to seek immediate medical attention should they occur. Patient verbalized understanding of these instructions and education.  Patient Consent Given: Yes Education handout provided: Yes Muscles treated: bilateral gluteal 1x on the right 2x on the left using a .30x75 needle Electrical stimulation performed: No Parameters: N/A Treatment response/outcome: great twitch care taken on the right to stay away from stimulator.   Manual: trigger point release to gluteal bilateral; skilled palpation of trigger points   Seated clamshell 2x15 red with cuing for sets /reps/RPE   Reviewed self soft tissue mobilization using the thera-can and the tennis ball       PATIENT EDUCATION:  Education details: aquatic therapy progressions and modifications   Person educated: Patient Education method: Explanation, Demonstration, Tactile cues, Verbal cues,  Education comprehension: verbalized understanding, returned demonstration, verbal cues required, tactile cues required, and needs further education  HOME EXERCISE PROGRAM:  Access Code: [facility] URL: [email or web address] Date: 01/03/2023 Prepared by: Zebedee Iba  Access Code: [email or web address] URL: [email or web address] Date:  03/28/2023 Prepared by: Geni Bers  Exercises - Hand Buoy Carry  - 1 x daily - 7 x weekly - 3 sets - 10 reps - Side lunge with hand buoys  - 1 x daily - 7 x weekly - 3 sets - 10 reps - Standing 'L' Stretch at [employer]  - 1 x daily - 7 x weekly - 3 sets - 10 reps - Standing Hip Hinge  - 1 x daily - 7 x weekly - 3 sets - 10 reps - Drawing Bow  - 1 x daily - 7 x weekly - 3 sets - 10 reps - Noodle press  - 1 x daily - 7 x weekly - 3 sets - 10 reps - Seated Straddle on Flotation Forward Breast Stroke Arms and Bicycle Legs  - 1 x daily - 7 x weekly - 3 sets - 10 reps - Plank on Long Hand Float  - 1 x daily - 7 x weekly - 3 sets - 10 reps - Plank on Long [employer] with Leg Lift  - 1 x daily - 7 x weekly - 3 sets - 10 reps - Plank on Long [employer] with Arm Lifts  - 1 x daily - 7 x weekly - 3  sets - 10 reps    ASSESSMENT:  CLINICAL IMPRESSION: PN:Pt reports baseline pain is down ~ 3-4 NPRS. She is compliant with land based exercises as instructed and is issued aquatic final HEP today. She completes with vc and some demonstration but no increase in pain. She has met all STG's and is progressing toward all LTGs.  She continues to manage her chronic pain daily which does wax and wane. She continues to be limited in amb due to pain. DN provided good relief of ms spasms and aquatic intervention has allowed for overall pain sensitivity to decline. She will continue to benefit from skilled physical therapy intervention for a few more visits, 1 in aquatics where she will have met her max potential then x 3 land based to further reach all goals set.          FROM EVAL: Patient is a 62 y.o. female who was seen today for physical therapy evaluation and treatment for c/c of chronic LBP. Pt's s/s appear consistent with lumbar radiculopathy and complex presentation given long standing history of LBP, multiple surgeries, as well as several concomitant pain syndromes contributing to her chronic pain.  Pt's pain is highly sensitive and irritable with movement. Pt's is more pain dominant at this time. Plan to continue with aquatic therapy and land therapy for lumbar mobility and general functional mobility/LE strength at future sessions. Plan for progressive strengthening as pt's previous PT episodes did not target resistance training. Pt would benefit from continued skilled therapy in order to reach goals and maximize functional lumbopelvic strength and ROM for prevention of further functional decline.    OBJECTIVE IMPAIRMENTS: decreased activity tolerance, decreased endurance, decreased mobility, decreased ROM, decreased strength, hypomobility, increased muscle spasms, impaired flexibility, improper body mechanics, postural dysfunction, and pain.   ACTIVITY LIMITATIONS: carrying, lifting, sleeping, transfers, dressing, and bending, squatting  PARTICIPATION LIMITATIONS: cleaning, laundry, yard work, community activity, and exercise  PERSONAL FACTORS: Age, Fitness, Time since onset of injury/illness/exacerbation, and 3+ comorbidities:  are also affecting patient's functional outcome.   REHAB POTENTIAL: Fair  CLINICAL DECISION MAKING: unstable/complicated  EVALUATION COMPLEXITY: Moderate   SHORT TERM GOALS: Target date: 02/14/2023   Pt will become independent with HEP in order to demonstrate synthesis of PT education.   Goal status: Met 04/04/23  2.  Pt will report at least 2 pt reduction on NPRS scale for pain in order to demonstrate functional improvement with household activity, self care, and ADL.   Goal status: Met 04/04/23  3. Pt will score at least 9 pt increase on FOTO to demonstrate functional improvement in MCII and pt perceived function.      Goal status:MET - 01/26/23  LONG TERM GOALS: Target date: 04/21/23    Pt  will become independent with final HEP in order to demonstrate synthesis of PT education.  Baseline:  Goal status: In process. 04/04/23  2. Pt will score >/=  40 on FOTO to demonstrate improvement in perceived lumbar function.  Baseline:  Goal status: MET- 01/26/23  3.  Pt will be able to perform 5XSTS in under 12s  in order to demonstrate functional improvement above the cut off score for adults.  Goal status: IN PROGRESS  -01/26/23; MET 04/04/23  4.  Pt will be able to demonstrate/report ability to walk >15 mins without pain in order to demonstrate functional improvement and tolerance to exercise and community mobility.   Goal status: Ongoing PLAN:  PT FREQUENCY: 1-2x/week  PT DURATION: 12 weeks (likely to D/C  by 8 with independent management)  PLANNED INTERVENTIONS: Therapeutic exercises, Therapeutic activity, Neuromuscular re-education, Balance training, Gait training, Patient/Family education, Self Care, Joint mobilization, Joint manipulation, Orthotic/Fit training, DME instructions, Aquatic Therapy, Dry Needling, Electrical stimulation, Spinal manipulation, Spinal mobilization, Cryotherapy, Moist heat, scar mobilization, Splintting, Taping, Vasopneumatic device, Traction, Ultrasound, Ionotophoresis 4mg /ml Dexamethasone, Manual therapy, and Re-evaluation.  PLAN FOR NEXT SESSION: Aquatic: 1 more session for final instruction on HEP.  3 session land based for DN and further instruction on HEP.  Corrie Dandy Tomma Lightning) Kaled Allende MPT 04/04/23 11:19 AM

## 2023-04-06 ENCOUNTER — Telehealth: Payer: Self-pay | Admitting: Neurology

## 2023-04-06 ENCOUNTER — Ambulatory Visit (HOSPITAL_BASED_OUTPATIENT_CLINIC_OR_DEPARTMENT_OTHER): Payer: Medicare Other | Admitting: Physical Therapy

## 2023-04-06 ENCOUNTER — Encounter (HOSPITAL_BASED_OUTPATIENT_CLINIC_OR_DEPARTMENT_OTHER): Payer: Self-pay | Admitting: Physical Therapy

## 2023-04-06 DIAGNOSIS — M545 Low back pain, unspecified: Secondary | ICD-10-CM

## 2023-04-06 DIAGNOSIS — R262 Difficulty in walking, not elsewhere classified: Secondary | ICD-10-CM

## 2023-04-06 DIAGNOSIS — M6281 Muscle weakness (generalized): Secondary | ICD-10-CM | POA: Diagnosis not present

## 2023-04-06 NOTE — Telephone Encounter (Addendum)
Error

## 2023-04-06 NOTE — Therapy (Signed)
OUTPATIENT PHYSICAL THERAPY THORACOLUMBAR TREATMENT     Patient Name: Cassidy Olsen MRN: [id number] DOB:[birth year], 62 y.o., female Today's Date: 04/06/2023  END OF SESSION:  PT End of Session - 04/06/23 0923     Visit Number 14    Number of Visits 17    Date for PT Re-Evaluation 04/21/23    Authorization Type MCR    PT Start Time 0901    PT Stop Time 0945    PT Time Calculation (min) 44 min    Activity Tolerance Patient tolerated treatment well;Other (comment)    Behavior During Therapy WFL for tasks assessed/performed               Past Medical History:  Diagnosis Date   ADHD    Arthritis 2010   Asthma    Back pain    Chronic headaches    Chronic pain    Constipation    Edema of both lower extremities    Family history of breast cancer    Family history of prostate cancer    Gallstones 2018   GERD (gastroesophageal reflux disease)    High cholesterol    History of kidney stones    Hx: UTI (urinary tract infection)    Hypertension    IBS (irritable bowel syndrome)    Interstitial cystitis 2012   Joint pain    Kidney problem    Migraine    Occipital neuralgia    Osteoarthritis    PNA (pneumonia) 2018   Pre-diabetes    Spinal stenosis    Trigeminal neuralgia    Past Surgical History:  Procedure Laterality Date   ABDOMINAL HYSTERECTOMY     ANKLE SURGERY Left 05/2009   APPENDECTOMY     Benign Tumor Removal Right 01/26/2017   right upper arm by Dr. Benard Rink at [facility] [place]   BRAIN SURGERY     CARPAL TUNNEL RELEASE Right 2014   CHOLECYSTECTOMY N/A 12/24/2020   Procedure: LAPAROSCOPIC CHOLECYSTECTOMY;  Surgeon: Abigail Miyamoto, MD;  Location: [facility];  Service: General;  Laterality: N/A;   COLONOSCOPY     CYSTOSCOPY W/ URETERAL STENT PLACEMENT Left 07/25/2019   Procedure: CYSTOSCOPY WITH RETROGRADE PYELOGRAM/URETERAL STENT PLACEMENT;  Surgeon: Marcine Matar, MD;  Location: [facility];  Service: Urology;  Laterality: Left;    CYSTOSCOPY W/ URETERAL STENT REMOVAL Left 08/15/2019   Procedure: CYSTOSCOPY WITH STENT REMOVAL;  Surgeon: Marcine Matar, MD;  Location: [facility];  Service: Urology;  Laterality: Left;   CYSTOSCOPY WITH RETROGRADE PYELOGRAM, URETEROSCOPY AND STENT PLACEMENT Left 08/15/2019   Procedure: CYSTOSCOPY WITH RETROGRADE PYELOGRAM, URETEROSCOPY;  Surgeon: Marcine Matar, MD;  Location: [facility];  Service: Urology;  Laterality: Left;  90 MINS   CYSTOSCOPY WITH RETROGRADE PYELOGRAM, URETEROSCOPY AND STENT PLACEMENT Right 09/12/2019   Procedure: CYSTOSCOPY WITH RIGHT RETROGRADE PYELOGRAM  URETEROSCOPY WITH HOLMIUM LASER STONE EXTRACTION AND STENT PLACEMENT;  Surgeon: Marcine Matar, MD;  Location: [facility];  Service: Urology;  Laterality: Right;   DILATION AND CURETTAGE OF UTERUS     HOLMIUM LASER APPLICATION Left 08/15/2019   Procedure: HOLMIUM LASER APPLICATION;  Surgeon: Marcine Matar, MD;  Location: [facility];  Service: Urology;  Laterality: Left;   JOINT REPLACEMENT     R knee   KIDNEY STONE SURGERY     neuro spine similator  01/2021   OVARIAN CYST REMOVAL  05/2010   right knee revision  2016   ROTATOR CUFF REPAIR Right 02/2019   SPINE SURGERY     x 3  TUBAL LIGATION     Patient Active Problem List   Diagnosis Date Noted   Gastroesophageal reflux disease 12/18/2022   Spinal stenosis 12/18/2022   Obesity: Starting BMI 33.3 11/09/2022   BMI 30.0-30.9,adult Current BMI 30.9 11/09/2022   Hypertension, essential 07/26/2022   Class 1 obesity with serious comorbidity and body mass index (BMI) of 33.0 to 33.9 in adult 06/23/2022   Mood disorder (HCC)- with emotional eating 05/23/2022   Mixed hyperlipidemia 05/23/2022   Prediabetes 04/26/2022   Depression 04/26/2022   Allergic rhinitis 01/26/2022   Morbid obesity (HCC) 09/24/2021   Exertional chest pain 09/24/2021   S/P laparoscopic cholecystectomy 12/24/2020   RUQ abdominal pain 12/25/2019   Decreased range of knee movement 09/02/2019    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   Irritable bowel syndrome with constipation 09/02/2019   Calculus, ureter 07/24/2019   Chronic migraine without aura, with intractable migraine, so stated, with status migrainosus 05/30/2019   Chronic asthmatic bronchitis 05/14/2019   Excessive daytime sleepiness 01/08/2019   Upper airway cough syndrome 11/12/2018   Acute bacterial sinusitis 07/17/2018   Abnormal chest CT 07/17/2018   Trigeminal neuralgia of right side of face 02/01/2018   Occipital neuralgia 02/01/2018   Chronic migraine without aura without status migrainosus, not intractable 02/01/2018   Decreased strength 05/10/2017   Family history of breast cancer    Family history of prostate cancer    Chronic left shoulder pain 09/02/2015   Carpal tunnel syndrome 08/25/2011   Asthma 04/14/2009   Essential hypertension 04/14/2009   Hyperlipidemia 04/14/2009    PCP: Woodfin Ganja, MD   REFERRING PROVIDER: Venita Lick, MD  REFERRING DIAG:  Diagnosis  M54.59 (ICD-10-CM) - Other low back pain    Rationale for Evaluation and Treatment: Rehabilitation  THERAPY DIAG:  Pain, lumbar region  Muscle weakness (generalized)  Difficulty walking  ONSET DATE: March 2024  SUBJECTIVE:                                                                                                                                                                                           SUBJECTIVE STATEMENT: "Bad night pain has been high"  Pt states the back feels better but does continue to have pain and difficulty with lifting. Pt had RFA recently and ablation scheduled for 8/14 for L4. Pt is in the CAM boot for the L foot. She has seen podiatrist and is in boot for the next 7 wks. Pt believes that aquatics has helped a lot. Pt is seeing chiro for her neck and is interested in PT for the neck at some point. 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   PERTINENT HISTORY:  2002  lumar micro discectomy  2007 C6-7 Fusion 2014 L5-S1 fusion  2015-2016 R knee TKA and revision  2022 lumbar nerve stimulator  PAIN:  Are you having pain? Yes: NPRS scale: 7/10 Pain location: localized to L lumbar  Pain description: achy, sharp, "rod pushing through my back," stairs Aggravating factors: any bending, lifting, standing Relieving factors: No relief  PRECAUTIONS: nerve stimulator  WEIGHT BEARING RESTRICTIONS: No  FALLS:  Has patient fallen in last 6 months? Yes. 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 At L3-4 there is a broad-based disc bulge. Severe bilateral facet arthropathy. Moderate spinal stenosis. Bilateral subarticular recess stenosis. Moderate left foraminal stenosis. Moderate-severe right foraminal stenosis. 2. At L4-5 there is a broad-based disc bulge. Moderate bilateral facet arthropathy with bilateral mild facet effusions. Severe spinal stenosis. Severe right foraminal stenosis. Mild-moderate left foraminal stenosis. 3. Posterior lumbar fusion at L5-S1 with a broad-based disc bulge. No spinal stenosis. Mild bilateral foraminal stenosis. 4. No acute osseous injury of the lumbar spine.   Fusion hardware noted at L5-S1 no abnormalities noted adjacent segment disease noted at L4-5 and L3-4.    PATIENT SURVEYS:  FOTO 32 40 @ DC 9 MCII  01/26/23:  FOTO 47 7/18 52 FOTO GOAL MET  SCREENING FOR RED FLAGS: Bowel or bladder incontinence: No Spinal tumors:  No Cauda equina syndrome: No Compression fracture: No Abdominal aneurysm: No  COGNITION: Overall cognitive status: Within functional limits for tasks assessed     SENSATION: Light touch: Impaired   POSTURE: rounded shoulders and decreased lumbar lordosis  PALPATION: TTP of L lumbar paraspinals and L QL; hypertonicity of bilat lumbar paraspinals and QL  LUMBAR ROM:   AROM 7/18  Flexion 60% p! On L   Extension 50% R sided p!  Right lateral flexion 50% p!  Left lateral flexion 50%  Right rotation 75%  L p!  Left rotation 60%   (Blank rows = not tested)   LOWER EXTREMITY MMT:  4/5 throughout with mild pain into low back   LUMBAR SPECIAL TESTS:  Straight leg raise test: Positive, FABER test: Positive, and Trendelenburg sign: Positive  FUNCTIONAL TESTS:  5 times sit to stand: 17.2s 01/26/23: 5x STS at bench  13.9s  02/23/23: 5x STS at bench  12.5s 04/04/23: 5x STS at bench   11.8s    GAIT: Distance walked: 72ft Assistive device utilized: None Level of assistance: Complete Independence Comments: L antalgic gait due to booka  TODAY'S TREATMENT:  DATE:  04/06/23  Pt seen for aquatic therapy today.  Treatment took place in water 3.5-4.75 ft in depth at the [facility] pool. Temp of water was 91.  Pt entered/exited the pool via stairs independently with bilat rail.   Exercises - Hand Buoy Carry   - Side lunge with hand buoys  - Standing 'L' Stretch at [employer]   - Standing Hip Hinge  - Drawing Bow  - Noodle press  - Seated Straddle on [employer] Breast Stroke Arms and Bicycle Legs   - Plank on Long Hand Float   - Plank on Long Psychologist, sport and exercise with Leg Lift   - Plank on [employer] with Arm Lifts x 5  Reps varied according to toleration  Pt requires the buoyancy and hydrostatic pressure of water for support, and to offload  joints by unweighting joint load by at least 50 % in navel deep water and by at least 75-80% in chest to neck deep water.  Viscosity of the water is needed for resistance of strengthening. Water current perturbations provides challenge to standing balance requiring increased core activation.   8/22  Trigger Point Dry-Needling  Treatment instructions: Expect mild to moderate muscle soreness. S/S of pneumothorax if dry needled over a lung field, and to seek immediate medical attention should they occur. Patient verbalized understanding of these instructions and education.  Patient Consent Given: Yes Education handout provided: Yes Muscles treated: bilateral gluteal 1x on the right 2x on the left using a .30x75 needle Electrical stimulation performed: No Parameters: N/A Treatment response/outcome: great twitch care taken on the right to stay away from stimulator.   Manual: trigger point release to gluteal bilateral; skilled palpation of trigger points   Seated clamshell 2x15 red with cuing for sets /reps/RPE   Reviewed self soft tissue mobilization using the thera-can and the tennis ball       PATIENT EDUCATION:  Education details: aquatic therapy progressions and modifications   Person educated: Patient Education method: Explanation, Demonstration, Tactile cues, Verbal cues,  Education comprehension: verbalized understanding, returned demonstration, verbal cues required, tactile cues required, and needs further education  HOME EXERCISE PROGRAM:  Access Code: [facility] URL: [email or web address] Date: 01/03/2023 Prepared by: Zebedee Iba  Access Code: [email or web address] URL: [email or web address] Date: 03/28/2023 Prepared by: Geni Bers  Exercises - Hand Buoy Carry  - 1 x daily - 7 x weekly - 3 sets - 10 reps - Side lunge with hand buoys  - 1 x daily - 7 x weekly - 3 sets - 10 reps - Standing 'L' Stretch at [employer]  - 1 x daily - 7 x weekly - 3 sets -  10 reps - Standing Hip Hinge  - 1 x daily - 7 x weekly - 3 sets - 10 reps - Drawing Bow  - 1 x daily - 7 x weekly - 3 sets - 10 reps - Noodle press  - 1 x daily - 7 x weekly - 3 sets - 10 reps - Seated Straddle on Flotation Forward Breast Stroke Arms and Bicycle Legs  - 1 x daily - 7 x weekly - 3 sets - 10 reps - Plank on Long Hand Float  - 1 x daily - 7 x weekly - 3 sets - 10 reps - Plank on Long [employer] with Leg Lift  - 1 x daily - 7 x weekly - 3 sets - 10 reps - Plank on Long [employer] with Arm Lifts  - 1 x daily -  7 x weekly - 3 sets - 10 reps    ASSESSMENT:  CLINICAL IMPRESSION: Pt arrives with increased pain sensitivity throughout night.  Pain limits participation in session.  We are able to get through entire final aquatic HEP with pt demonstrating good knowledge and understanding as well as execution of program . Techniques to increase and decrease challenge given dependent on her tolerance level day to day. Minor vc for clarification of execution given as well as written on laminated program.  She is planning on becoming member at [employer] beginning in Oct to have access to pool.  She has reached her max potential in aquatic setting.    PN:Pt reports baseline pain is down ~ 3-4 NPRS. She is compliant with land based exercises as instructed and is issued aquatic final HEP today. She completes with vc and some demonstration but no increase in pain. She has met all STG's and is progressing toward all LTGs.  She continues to manage her chronic pain daily which does wax and wane. She continues to be limited in amb due to pain. DN provided good relief of ms spasms and aquatic intervention has allowed for overall pain sensitivity to decline. She will continue to benefit from skilled physical therapy intervention for a few more visits, 1 in aquatics where she will have met her max potential then x 3 land based to further reach all goals set.     FROM EVAL: Patient is a 62 y.o. female who  was seen today for physical therapy evaluation and treatment for c/c of chronic LBP. Pt's s/s appear consistent with lumbar radiculopathy and complex presentation given long standing history of LBP, multiple surgeries, as well as several concomitant pain syndromes contributing to her chronic pain. Pt's pain is highly sensitive and irritable with movement. Pt's is more pain dominant at this time. Plan to continue with aquatic therapy and land therapy for lumbar mobility and general functional mobility/LE strength at future sessions. Plan for progressive strengthening as pt's previous PT episodes did not target resistance training. Pt would benefit from continued skilled therapy in order to reach goals and maximize functional lumbopelvic strength and ROM for prevention of further functional decline.    OBJECTIVE IMPAIRMENTS: decreased activity tolerance, decreased endurance, decreased mobility, decreased ROM, decreased strength, hypomobility, increased muscle spasms, impaired flexibility, improper body mechanics, postural dysfunction, and pain.   ACTIVITY LIMITATIONS: carrying, lifting, sleeping, transfers, dressing, and bending, squatting  PARTICIPATION LIMITATIONS: cleaning, laundry, yard work, community activity, and exercise  PERSONAL FACTORS: Age, Fitness, Time since onset of injury/illness/exacerbation, and 3+ comorbidities:  are also affecting patient's functional outcome.   REHAB POTENTIAL: Fair  CLINICAL DECISION MAKING: unstable/complicated  EVALUATION COMPLEXITY: Moderate   SHORT TERM GOALS: Target date: 02/14/2023   Pt will become independent with HEP in order to demonstrate synthesis of PT education.   Goal status: Met 04/04/23  2.  Pt will report at least 2 pt reduction on NPRS scale for pain in order to demonstrate functional improvement with household activity, self care, and ADL.   Goal status: Met 04/04/23  3. Pt will score at least 9 pt increase on FOTO to demonstrate  functional improvement in MCII and pt perceived function.      Goal status:MET - 01/26/23  LONG TERM GOALS: Target date: 04/21/23    Pt  will become independent with final HEP in order to demonstrate synthesis of PT education.  Baseline:  Goal status: In process. 04/04/23  2. Pt will score >/= 40 on  FOTO to demonstrate improvement in perceived lumbar function.  Baseline:  Goal status: MET- 01/26/23  3.  Pt will be able to perform 5XSTS in under 12s  in order to demonstrate functional improvement above the cut off score for adults.  Goal status: IN PROGRESS  -01/26/23; MET 04/04/23  4.  Pt will be able to demonstrate/report ability to walk >15 mins without pain in order to demonstrate functional improvement and tolerance to exercise and community mobility.   Goal status: Ongoing PLAN:  PT FREQUENCY: 1-2x/week  PT DURATION: 3 weeks  PLANNED INTERVENTIONS: Therapeutic exercises, Therapeutic activity, Neuromuscular re-education, Balance training, Gait training, Patient/Family education, Self Care, Joint mobilization, Joint manipulation, Orthotic/Fit training, DME instructions, Aquatic Therapy, Dry Needling, Electrical stimulation, Spinal manipulation, Spinal mobilization, Cryotherapy, Moist heat, scar mobilization, Splintting, Taping, Vasopneumatic device, Traction, Ultrasound, Ionotophoresis 4mg /ml Dexamethasone, Manual therapy, and Re-evaluation.  PLAN FOR NEXT SESSION: Aquatic: 1 more session for final instruction on HEP.  3 session land based for DN and further instruction on HEP.  Corrie Dandy Tomma Lightning) Brittin Belnap MPT 04/06/23 9:24 AM

## 2023-04-11 ENCOUNTER — Other Ambulatory Visit: Payer: Self-pay | Admitting: General Practice

## 2023-04-11 DIAGNOSIS — R1011 Right upper quadrant pain: Secondary | ICD-10-CM | POA: Diagnosis not present

## 2023-04-12 ENCOUNTER — Encounter (HOSPITAL_BASED_OUTPATIENT_CLINIC_OR_DEPARTMENT_OTHER): Payer: Self-pay

## 2023-04-12 ENCOUNTER — Ambulatory Visit (HOSPITAL_BASED_OUTPATIENT_CLINIC_OR_DEPARTMENT_OTHER): Payer: Medicare Other | Admitting: Physical Therapy

## 2023-04-12 ENCOUNTER — Encounter (HOSPITAL_BASED_OUTPATIENT_CLINIC_OR_DEPARTMENT_OTHER): Payer: Self-pay | Admitting: Physical Therapy

## 2023-04-12 DIAGNOSIS — M961 Postlaminectomy syndrome, not elsewhere classified: Secondary | ICD-10-CM | POA: Diagnosis not present

## 2023-04-12 DIAGNOSIS — G894 Chronic pain syndrome: Secondary | ICD-10-CM | POA: Diagnosis not present

## 2023-04-12 DIAGNOSIS — G500 Trigeminal neuralgia: Secondary | ICD-10-CM | POA: Diagnosis not present

## 2023-04-12 DIAGNOSIS — M545 Low back pain, unspecified: Secondary | ICD-10-CM

## 2023-04-12 DIAGNOSIS — R262 Difficulty in walking, not elsewhere classified: Secondary | ICD-10-CM

## 2023-04-12 DIAGNOSIS — M6281 Muscle weakness (generalized): Secondary | ICD-10-CM

## 2023-04-12 DIAGNOSIS — Z9689 Presence of other specified functional implants: Secondary | ICD-10-CM | POA: Diagnosis not present

## 2023-04-12 DIAGNOSIS — M47816 Spondylosis without myelopathy or radiculopathy, lumbar region: Secondary | ICD-10-CM | POA: Diagnosis not present

## 2023-04-12 NOTE — Therapy (Signed)
OUTPATIENT PHYSICAL THERAPY THORACOLUMBAR TREATMENT     Patient Name: Cassidy Olsen MRN: [id number] DOB:04-Nov-1960, 62 y.o., female Today's Date: 04/12/2023  END OF SESSION:  PT End of Session - 04/12/23 1027     Visit Number 15    Number of Visits 17    Date for PT Re-Evaluation 04/21/23    Authorization Type MCR    PT Start Time 1027    PT Stop Time 1047    PT Time Calculation (min) 20 min    Activity Tolerance Patient tolerated treatment well;Other (comment)    Behavior During Therapy WFL for tasks assessed/performed               Past Medical History:  Diagnosis Date   ADHD    Arthritis 2010   Asthma    Back pain    Chronic headaches    Chronic pain    Constipation    Edema of both lower extremities    Family history of breast cancer    Family history of prostate cancer    Gallstones 2018   GERD (gastroesophageal reflux disease)    High cholesterol    History of kidney stones    Hx: UTI (urinary tract infection)    Hypertension    IBS (irritable bowel syndrome)    Interstitial cystitis 2012   Joint pain    Kidney problem    Migraine    Occipital neuralgia    Osteoarthritis    PNA (pneumonia) 2018   Pre-diabetes    Spinal stenosis    Trigeminal neuralgia    Past Surgical History:  Procedure Laterality Date   ABDOMINAL HYSTERECTOMY     ANKLE SURGERY Left 05/2009   APPENDECTOMY     Benign Tumor Removal Right 01/26/2017   right upper arm by Dr. Benard Rink at [facility] [place]   BRAIN SURGERY     CARPAL TUNNEL RELEASE Right 2014   CHOLECYSTECTOMY N/A 12/24/2020   Procedure: LAPAROSCOPIC CHOLECYSTECTOMY;  Surgeon: Abigail Miyamoto, MD;  Location: [facility] OR;  Service: General;  Laterality: N/A;   COLONOSCOPY     CYSTOSCOPY W/ URETERAL STENT PLACEMENT Left 07/25/2019   Procedure: CYSTOSCOPY WITH RETROGRADE PYELOGRAM/URETERAL STENT PLACEMENT;  Surgeon: Marcine Matar, MD;  Location: [facility] ORS;  Service: Urology;  Laterality: Left;    CYSTOSCOPY W/ URETERAL STENT REMOVAL Left 08/15/2019   Procedure: CYSTOSCOPY WITH STENT REMOVAL;  Surgeon: Marcine Matar, MD;  Location: [facility];  Service: Urology;  Laterality: Left;   CYSTOSCOPY WITH RETROGRADE PYELOGRAM, URETEROSCOPY AND STENT PLACEMENT Left 08/15/2019   Procedure: CYSTOSCOPY WITH RETROGRADE PYELOGRAM, URETEROSCOPY;  Surgeon: Marcine Matar, MD;  Location: [facility];  Service: Urology;  Laterality: Left;  90 MINS   CYSTOSCOPY WITH RETROGRADE PYELOGRAM, URETEROSCOPY AND STENT PLACEMENT Right 09/12/2019   Procedure: CYSTOSCOPY WITH RIGHT RETROGRADE PYELOGRAM  URETEROSCOPY WITH HOLMIUM LASER STONE EXTRACTION AND STENT PLACEMENT;  Surgeon: Marcine Matar, MD;  Location: [facility];  Service: Urology;  Laterality: Right;   DILATION AND CURETTAGE OF UTERUS     HOLMIUM LASER APPLICATION Left 08/15/2019   Procedure: HOLMIUM LASER APPLICATION;  Surgeon: Marcine Matar, MD;  Location: [facility];  Service: Urology;  Laterality: Left;   JOINT REPLACEMENT     R knee   KIDNEY STONE SURGERY     neuro spine similator  01/2021   OVARIAN CYST REMOVAL  05/2010   right knee revision  2016   ROTATOR CUFF REPAIR Right 02/2019   SPINE SURGERY     x 3  TUBAL LIGATION     Patient Active Problem List   Diagnosis Date Noted   Gastroesophageal reflux disease 12/18/2022   Spinal stenosis 12/18/2022   Obesity: Starting BMI 33.3 11/09/2022   BMI 30.0-30.9,adult Current BMI 30.9 11/09/2022   Hypertension, essential 07/26/2022   Class 1 obesity with serious comorbidity and body mass index (BMI) of 33.0 to 33.9 in adult 06/23/2022   Mood disorder (HCC)- with emotional eating 05/23/2022   Mixed hyperlipidemia 05/23/2022   Prediabetes 04/26/2022   Depression 04/26/2022   Allergic rhinitis 01/26/2022   Morbid obesity (HCC) 09/24/2021   Exertional chest pain 09/24/2021   S/P laparoscopic cholecystectomy 12/24/2020   RUQ abdominal pain 12/25/2019   Decreased range of knee movement 09/02/2019    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   Irritable bowel syndrome with constipation 09/02/2019   Calculus, ureter 07/24/2019   Chronic migraine without aura, with intractable migraine, so stated, with status migrainosus 05/30/2019   Chronic asthmatic bronchitis 05/14/2019   Excessive daytime sleepiness 01/08/2019   Upper airway cough syndrome 11/12/2018   Acute bacterial sinusitis 07/17/2018   Abnormal chest CT 07/17/2018   Trigeminal neuralgia of right side of face 02/01/2018   Occipital neuralgia 02/01/2018   Chronic migraine without aura without status migrainosus, not intractable 02/01/2018   Decreased strength 05/10/2017   Family history of breast cancer    Family history of prostate cancer    Chronic left shoulder pain 09/02/2015   Carpal tunnel syndrome 08/25/2011   Asthma 04/14/2009   Essential hypertension 04/14/2009   Hyperlipidemia 04/14/2009    PCP: Woodfin Ganja, MD   REFERRING PROVIDER: Venita Lick, MD  REFERRING DIAG:  Diagnosis  M54.59 (ICD-10-CM) - Other low back pain    Rationale for Evaluation and Treatment: Rehabilitation  THERAPY DIAG:  Pain, lumbar region  Muscle weakness (generalized)  Difficulty walking  ONSET DATE: March 2024  SUBJECTIVE:                                                                                                                                                                                           SUBJECTIVE STATEMENT: Pt states her pain is very bad today. It is R sided and hurts to bear down to move her bowels. Her stretches continue to make the pain worse. Pt is going back to her pain mgmt MD after this pain. Pt has abdominal pain with pressure to the L side. Bending over the sink hurts and causes pain. Pt did have some improvement with needling from last session. Gave her relief for the rest of the day. Pt has had IBS and ovarian cysts (removed) in the past.  Pt has [place] planned for kidney and urologists  tomorrow. Pt has had regular bowel movements the last 2-3 days.   Pt states the back feels better but does continue to have pain and difficulty with lifting. Pt had RFA recently and ablation scheduled for 8/14 for L4. Pt is in the CAM boot for the L foot. She has seen podiatrist and is in boot for the next 7 wks. Pt believes that aquatics has helped a lot. Pt is seeing chiro for her neck and is interested in PT for the neck at some point. 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   PERTINENT HISTORY:  2002 lumar micro discectomy  2007 C6-7 Fusion 2014 L5-S1 fusion  2015-2016 R knee TKA and revision  2022 lumbar nerve stimulator  PAIN:  Are you having pain? Yes: NPRS scale: 7/10 Pain location: localized to L lumbar  Pain description: achy, sharp, "rod pushing through my back," stairs Aggravating factors: any bending, lifting, standing Relieving factors: No relief  PRECAUTIONS: nerve stimulator  WEIGHT BEARING RESTRICTIONS: No  FALLS:  Has patient fallen in last 6 months? Yes. 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 Pt's s/s do not appear to be MSK in nature and HEP recreates pain. Pt's subjective report suggests GI and potentially kidney related pain. Pt has pain management visit directly after this session as well as scheduled ultra sounds and urologist visits. Plan for pt to get further diagnostic testing before return to PT, if indicated.    Zebedee Iba PT,  DPT 04/12/23 10:50 AM

## 2023-04-13 ENCOUNTER — Other Ambulatory Visit: Payer: Self-pay | Admitting: Cardiovascular Disease

## 2023-04-13 ENCOUNTER — Ambulatory Visit
Admission: RE | Admit: 2023-04-13 | Discharge: 2023-04-13 | Disposition: A | Discharge status: Home / Self Care | Payer: Medicare Other | Source: Ambulatory Visit | Attending: Family | Admitting: Family

## 2023-04-13 DIAGNOSIS — R1011 Right upper quadrant pain: Secondary | ICD-10-CM

## 2023-04-13 DIAGNOSIS — R1031 Right lower quadrant pain: Secondary | ICD-10-CM | POA: Diagnosis not present

## 2023-04-17 ENCOUNTER — Ambulatory Visit (HOSPITAL_BASED_OUTPATIENT_CLINIC_OR_DEPARTMENT_OTHER): Payer: Medicare Other | Attending: Orthopedic Surgery | Admitting: Physical Therapy

## 2023-04-17 ENCOUNTER — Encounter: Payer: Self-pay | Admitting: Nurse Practitioner

## 2023-04-17 ENCOUNTER — Ambulatory Visit (INDEPENDENT_AMBULATORY_CARE_PROVIDER_SITE_OTHER): Payer: Medicare Other | Admitting: Nurse Practitioner

## 2023-04-17 VITALS — BP 122/88 | HR 94 | Ht 63.0 in | Wt 184.0 lb

## 2023-04-17 DIAGNOSIS — R1011 Right upper quadrant pain: Secondary | ICD-10-CM | POA: Diagnosis not present

## 2023-04-17 DIAGNOSIS — R262 Difficulty in walking, not elsewhere classified: Secondary | ICD-10-CM

## 2023-04-17 DIAGNOSIS — M545 Low back pain, unspecified: Secondary | ICD-10-CM | POA: Diagnosis not present

## 2023-04-17 DIAGNOSIS — K589 Irritable bowel syndrome without diarrhea: Secondary | ICD-10-CM

## 2023-04-17 DIAGNOSIS — M6281 Muscle weakness (generalized): Secondary | ICD-10-CM

## 2023-04-17 DIAGNOSIS — K5909 Other constipation: Secondary | ICD-10-CM | POA: Diagnosis not present

## 2023-04-17 MED ORDER — TRULANCE 3 MG PO TABS: 3.0000 mg | ORAL_TABLET | Freq: Every morning | ORAL | 5 refills | Status: DC

## 2023-04-17 NOTE — Patient Instructions (Addendum)
Your provider recommends that you complete a bowel purge (to clean out your bowels).   You will need to purchase the following over the counter: 238 gram bottle of Miralax (polyethylene glycol)  Dulcolax 5 mg tablets (four tablets) Optional: 32 ounce bottle of Gatorade (Gatorade Zero for diabetic patients)  Mix 6-8 capfuls of Miralax with 32 ounces of desired liquid (water/juice/Gatorade) and place in the refrigerator. Take 4 dulcolax tablets with water. Wait 1 hour. You should then drink 8 ounces of Miralax/liquid solution every 15 minutes over the next 1-3 hours until the solution is gone. You should expect results within 1 to 6 hours after completing the bowel purge. Please try the muscle relaxer that Urology recommended. Suggest taking at bedtime as it may cause drowsiness.  Continue topical Lidocaine ointment to right upper abdomen  Miralax bowel purge   After bowel purge start Trulance. Take once daiy  Stop Amitiza   Take Bentyl 10 mg twice daily as needed to abdominal pain.  Due to recent changes in healthcare laws, you may see the results of your imaging and laboratory studies on MyChart before your provider has had a chance to review them.  We understand that in some cases there may be results that are confusing or concerning to you. Not all laboratory results come back in the same time frame and the provider may be waiting for multiple results in order to interpret others.  Please give [place] 48 hours in order for your provider to thoroughly review all the results before contacting the office for clarification of your results.   It was a pleasure to see you today!  Thank you for trusting me with your gastrointestinal care!

## 2023-04-17 NOTE — Therapy (Signed)
OUTPATIENT PHYSICAL THERAPY THORACOLUMBAR TREATMENT     Patient Name: Cassidy Olsen MRN: [id number] DOB:Sep 08, 1960, 62 y.o., female Today's Date: 04/17/2023  END OF SESSION:      Past Medical History:  Diagnosis Date   ADHD    Arthritis 2010   Asthma    Back pain    Chronic headaches    Chronic pain    Constipation    Edema of both lower extremities    Family history of breast cancer    Family history of prostate cancer    Gallstones 2018   GERD (gastroesophageal reflux disease)    High cholesterol    History of kidney stones    Hx: UTI (urinary tract infection)    Hypertension    IBS (irritable bowel syndrome)    Interstitial cystitis 2012   Joint pain    Kidney problem    Migraine    Occipital neuralgia    Osteoarthritis    PNA (pneumonia) 2018   Posttraumatic muscle contracture (HCC)    Pre-diabetes    Spinal stenosis    Trigeminal neuralgia    Past Surgical History:  Procedure Laterality Date   ABDOMINAL HYSTERECTOMY     ANKLE SURGERY Left 05/2009   APPENDECTOMY     Benign Tumor Removal Right 01/26/2017   right upper arm by Dr. Benard Rink at [facility] [place]   BRAIN SURGERY     CARPAL TUNNEL RELEASE Right 2014   CHOLECYSTECTOMY N/A 12/24/2020   Procedure: LAPAROSCOPIC CHOLECYSTECTOMY;  Surgeon: Abigail Miyamoto, MD;  Location: [facility] OR;  Service: General;  Laterality: N/A;   COLONOSCOPY     CYSTOSCOPY W/ URETERAL STENT PLACEMENT Left 07/25/2019   Procedure: CYSTOSCOPY WITH RETROGRADE PYELOGRAM/URETERAL STENT PLACEMENT;  Surgeon: Marcine Matar, MD;  Location: [facility] ORS;  Service: Urology;  Laterality: Left;   CYSTOSCOPY W/ URETERAL STENT REMOVAL Left 08/15/2019   Procedure: CYSTOSCOPY WITH STENT REMOVAL;  Surgeon: Marcine Matar, MD;  Location: [facility];  Service: Urology;  Laterality: Left;   CYSTOSCOPY WITH RETROGRADE PYELOGRAM, URETEROSCOPY AND STENT PLACEMENT Left 08/15/2019   Procedure: CYSTOSCOPY WITH RETROGRADE PYELOGRAM,  URETEROSCOPY;  Surgeon: Marcine Matar, MD;  Location: [facility];  Service: Urology;  Laterality: Left;  90 MINS   CYSTOSCOPY WITH RETROGRADE PYELOGRAM, URETEROSCOPY AND STENT PLACEMENT Right 09/12/2019   Procedure: CYSTOSCOPY WITH RIGHT RETROGRADE PYELOGRAM  URETEROSCOPY WITH HOLMIUM LASER STONE EXTRACTION AND STENT PLACEMENT;  Surgeon: Marcine Matar, MD;  Location: [facility];  Service: Urology;  Laterality: Right;   DILATION AND CURETTAGE OF UTERUS     HOLMIUM LASER APPLICATION Left 08/15/2019   Procedure: HOLMIUM LASER APPLICATION;  Surgeon: Marcine Matar, MD;  Location: [facility];  Service: Urology;  Laterality: Left;   JOINT REPLACEMENT     R knee   KIDNEY STONE SURGERY     neuro spine similator  01/2021   OVARIAN CYST REMOVAL  05/2010   right knee revision  2016   ROTATOR CUFF REPAIR Right 02/2019   SPINE SURGERY     x 3   TUBAL LIGATION     Patient Active Problem List   Diagnosis Date Noted   Gastroesophageal reflux disease 12/18/2022   Spinal stenosis 12/18/2022   Obesity: Starting BMI 33.3 11/09/2022   BMI 30.0-30.9,adult Current BMI 30.9 11/09/2022   Hypertension, essential 07/26/2022   Class 1 obesity with serious comorbidity and body mass index (BMI) of 33.0 to 33.9 in adult 06/23/2022   Mood disorder (HCC)- with emotional eating 05/23/2022   Mixed hyperlipidemia 05/23/2022  Prediabetes 04/26/2022   Depression 04/26/2022   Allergic rhinitis 01/26/2022   Morbid obesity (HCC) 09/24/2021   Exertional chest pain 09/24/2021   S/P laparoscopic cholecystectomy 12/24/2020   RUQ abdominal pain 12/25/2019   Decreased range of knee movement 09/02/2019   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   Irritable bowel syndrome with constipation 09/02/2019   Calculus, ureter 07/24/2019   Chronic migraine without aura, with intractable migraine, so stated, with status migrainosus 05/30/2019   Chronic asthmatic bronchitis 05/14/2019   Excessive daytime sleepiness 01/08/2019    Upper airway cough syndrome 11/12/2018   Acute bacterial sinusitis 07/17/2018   Abnormal chest CT 07/17/2018   Trigeminal neuralgia of right side of face 02/01/2018   Occipital neuralgia 02/01/2018   Chronic migraine without aura without status migrainosus, not intractable 02/01/2018   Decreased strength 05/10/2017   Family history of breast cancer    Family history of prostate cancer    Chronic left shoulder pain 09/02/2015   Carpal tunnel syndrome 08/25/2011   Asthma 04/14/2009   Essential hypertension 04/14/2009   Hyperlipidemia 04/14/2009    PCP: Woodfin Ganja, MD   REFERRING PROVIDER: Venita Lick, MD  REFERRING DIAG:  Diagnosis  M54.59 (ICD-10-CM) - Other low back pain    Rationale for Evaluation and Treatment: Rehabilitation  THERAPY DIAG:  No diagnosis found.  ONSET DATE: March 2024  SUBJECTIVE:                                                                                                                                                                                           SUBJECTIVE STATEMENT: The patient has had a mild improvement in pain. Her urologist suggested it could be a psoas issue. She gave her some stretches hich have reduced the intensity. She sees her GI doc today.   Pt states the back feels better but does continue to have pain and difficulty with lifting. Pt had RFA recently and ablation scheduled for 8/14 for L4. Pt is in the CAM boot for the L foot. She has seen podiatrist and is in boot for the next 7 wks. Pt believes that aquatics has helped a lot. Pt is seeing chiro for her neck and is interested in PT for the neck at some point. 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   PERTINENT HISTORY:  2002 lumar micro discectomy  2007 C6-7 Fusion 2014 L5-S1 fusion  2015-2016 R knee TKA and revision  2022 lumbar nerve stimulator  PAIN:  Are you having pain? Yes: NPRS scale: 7/10 Pain location: localized to L lumbar  Pain  description: achy, sharp, "rod  pushing through my back," stairs Aggravating factors: any bending, lifting, standing Relieving factors: No relief  PRECAUTIONS: nerve stimulator  WEIGHT BEARING RESTRICTIONS: No  FALLS:  Has patient fallen in last 6 months? Yes. 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 At L3-4 there is a broad-based disc bulge. Severe bilateral facet arthropathy. Moderate spinal stenosis. Bilateral subarticular recess stenosis. Moderate left foraminal stenosis. Moderate-severe right foraminal stenosis. 2. At L4-5 there is a broad-based disc bulge. Moderate bilateral facet arthropathy with bilateral mild facet effusions. Severe spinal stenosis. Severe right foraminal stenosis. Mild-moderate left foraminal stenosis. 3. Posterior lumbar fusion at L5-S1 with a broad-based disc bulge. No spinal stenosis. Mild bilateral foraminal stenosis. 4. No acute osseous injury of the lumbar spine.   Fusion hardware noted at L5-S1 no abnormalities noted adjacent segment disease noted at L4-5 and L3-4.    PATIENT SURVEYS:  FOTO 32 40 @ DC 9 MCII  01/26/23:  FOTO 47 7/18 52 FOTO GOAL MET  SCREENING FOR RED FLAGS: Bowel or bladder incontinence: No Spinal tumors: No Cauda equina syndrome: No Compression fracture: No Abdominal aneurysm: No  COGNITION: Overall cognitive status: Within functional limits for tasks assessed     SENSATION: Light touch: Impaired   POSTURE: rounded shoulders  and decreased lumbar lordosis  PALPATION: TTP of L lumbar paraspinals and L QL; hypertonicity of bilat lumbar paraspinals and QL  LUMBAR ROM:   AROM 7/18  Flexion 60% p! On L   Extension 50% R sided p!  Right lateral flexion 50% p!  Left lateral flexion 50%  Right rotation 75%  L p!  Left rotation 60%   (Blank rows = not tested)   LOWER EXTREMITY MMT:  4/5 throughout with mild pain into low back   LUMBAR SPECIAL TESTS:  Straight leg raise test: Positive, FABER test: Positive, and Trendelenburg sign: Positive  FUNCTIONAL TESTS:  5 times sit to stand: 17.2s 01/26/23: 5x STS at bench  13.9s  02/23/23: 5x STS at bench  12.5s 04/04/23: 5x STS at bench   11.8s    GAIT: Distance walked: 63ft Assistive device utilized: None Level of assistance: Complete Independence Comments: L antalgic gait due to booka  TODAY'S TREATMENT:                                                                                                                              DATE:  04/06/23  Pt seen for aquatic therapy today.  Treatment took place  in water 3.5-4.75 ft in depth at the [facility] pool. Temp of water was 91.  Pt entered/exited the pool via stairs independently with bilat rail.   Exercises - Hand Buoy Carry   - Side lunge with hand buoys  - Standing 'L' Stretch at [employer]   - Standing Hip Hinge  - Drawing Bow  - Noodle press  - Seated Straddle on [employer] Breast Stroke Arms and Bicycle Legs   - Plank on Long Hand Float   - Plank on Long Psychologist, sport and exercise with Leg Lift   - Plank on [employer] with Arm Lifts x 5  Reps varied according to toleration  Pt requires the buoyancy and hydrostatic pressure of water for support, and to offload joints by unweighting joint load by at least 50 % in navel deep water and by at least 75-80% in chest to neck deep water.  Viscosity of the water is needed for resistance of strengthening. Water current perturbations provides challenge to  standing balance requiring increased core activation.   8/22  Trigger Point Dry-Needling  Treatment instructions: Expect mild to moderate muscle soreness. S/S of pneumothorax if dry needled over a lung field, and to seek immediate medical attention should they occur. Patient verbalized understanding of these instructions and education.  Patient Consent Given: Yes Education handout provided: Yes Muscles treated: bilateral gluteal 1x on the right 2x on the left using a .30x75 needle Electrical stimulation performed: No Parameters: N/A Treatment response/outcome: great twitch care taken on the right to stay away from stimulator.   Manual: trigger point release to gluteal bilateral; skilled palpation of trigger points   Seated clamshell 2x15 red with cuing for sets /reps/RPE   Reviewed self soft tissue mobilization using the thera-can and the tennis ball       PATIENT EDUCATION:  Education details: aquatic therapy progressions and modifications   Person educated: Patient Education method: Explanation, Demonstration, Tactile cues, Verbal cues,  Education comprehension: verbalized understanding, returned demonstration, verbal cues required, tactile cues required, and needs further education  HOME EXERCISE PROGRAM:  Access Code: [facility] URL: [email or web address] Date: 01/03/2023 Prepared by: Zebedee Iba  Access Code: [email or web address] URL: [email or web address] Date: 03/28/2023 Prepared by: Geni Bers  Exercises - Hand Buoy Carry  - 1 x daily - 7 x weekly - 3 sets - 10 reps - Side lunge with hand buoys  - 1 x daily - 7 x weekly - 3 sets - 10 reps - Standing 'L' Stretch at [employer]  - 1 x daily - 7 x weekly - 3 sets - 10 reps - Standing Hip Hinge  - 1 x daily - 7 x weekly - 3 sets - 10 reps - Drawing Bow  - 1 x daily - 7 x weekly - 3 sets - 10 reps - Noodle press  - 1 x daily - 7 x weekly - 3 sets - 10 reps - Seated Straddle on Flotation Forward  Breast Stroke Arms and Bicycle Legs  - 1 x daily - 7 x weekly - 3 sets - 10 reps - Plank on Long Hand Float  - 1 x daily - 7 x weekly - 3 sets - 10 reps - Plank on Long [employer] with Leg Lift  - 1 x daily - 7 x weekly - 3 sets - 10 reps - Plank on Long [employer] with Arm Lifts  - 1 x daily - 7 x weekly - 3 sets - 10 reps    ASSESSMENT:  CLINICAL IMPRESSION: Pt arrives with increased pain sensitivity throughout night.  Pain limits participation in session.  We are able to get through entire final aquatic HEP with pt demonstrating good knowledge and understanding as well as execution of program . Techniques to increase and decrease challenge given dependent on her tolerance level day to day. Minor vc for clarification of execution given as well as written on laminated program.  She is planning on becoming member at [place] beginning in Oct to have access to pool.  She has reached her max potential in aquatic setting.    PN:Pt reports baseline pain is down ~ 3-4 NPRS. She is compliant with land based exercises as instructed and is issued aquatic final HEP today. She completes with vc and some demonstration but no increase in pain. She has met all STG's and is progressing toward all LTGs.  She continues to manage her chronic pain daily which does wax and wane. She continues to be limited in amb due to pain. DN provided good relief of ms spasms and aquatic intervention has allowed for overall pain sensitivity to decline. She will continue to benefit from skilled physical therapy intervention for a few more visits, 1 in aquatics where she will have met her max potential then x 3 land based to further reach all goals set.     FROM EVAL: Patient is a 62 y.o. female who was seen today for physical therapy evaluation and treatment for c/c of chronic LBP. Pt's s/s appear consistent with lumbar radiculopathy and complex presentation given long standing history of LBP, multiple surgeries, as well as  several concomitant pain syndromes contributing to her chronic pain. Pt's pain is highly sensitive and irritable with movement. Pt's is more pain dominant at this time. Plan to continue with aquatic therapy and land therapy for lumbar mobility and general functional mobility/LE strength at future sessions. Plan for progressive strengthening as pt's previous PT episodes did not target resistance training. Pt would benefit from continued skilled therapy in order to reach goals and maximize functional lumbopelvic strength and ROM for prevention of further functional decline.    OBJECTIVE IMPAIRMENTS: decreased activity tolerance, decreased endurance, decreased mobility, decreased ROM, decreased strength, hypomobility, increased muscle spasms, impaired flexibility, improper body mechanics, postural dysfunction, and pain.   ACTIVITY LIMITATIONS: carrying, lifting, sleeping, transfers, dressing, and bending, squatting  PARTICIPATION LIMITATIONS: cleaning, laundry, yard work, community activity, and exercise  PERSONAL FACTORS: Age, Fitness, Time since onset of injury/illness/exacerbation, and 3+ comorbidities:  are also affecting patient's functional outcome.   REHAB POTENTIAL: Fair  CLINICAL DECISION MAKING: unstable/complicated  EVALUATION COMPLEXITY: Moderate   SHORT TERM GOALS: Target date: 02/14/2023   Pt will become independent with HEP in order to demonstrate synthesis of PT education.   Goal status: Met 04/04/23  2.  Pt will report at least 2 pt reduction on NPRS scale for pain in order to demonstrate functional improvement with household activity, self care, and ADL.   Goal status: Met 04/04/23  3. Pt will score at least 9 pt increase on FOTO to demonstrate functional improvement in MCII and pt perceived function.      Goal status:MET - 01/26/23  LONG TERM GOALS: Target date: 04/21/23    Pt  will become independent with final HEP in order to demonstrate synthesis of PT education.   Baseline:  Goal status: In process. 04/04/23  2. Pt will score >/= 40 on FOTO to demonstrate improvement in perceived lumbar function.  Baseline:  Goal status: MET-  01/26/23  3.  Pt will be able to perform 5XSTS in under 12s  in order to demonstrate functional improvement above the cut off score for adults.  Goal status: IN PROGRESS  -01/26/23; MET 04/04/23  4.  Pt will be able to demonstrate/report ability to walk >15 mins without pain in order to demonstrate functional improvement and tolerance to exercise and community mobility.   Goal status: Ongoing PLAN:  PT FREQUENCY: 1-2x/week  PT DURATION: 3 weeks  PLANNED INTERVENTIONS: Therapeutic exercises, Therapeutic activity, Neuromuscular re-education, Balance training, Gait training, Patient/Family education, Self Care, Joint mobilization, Joint manipulation, Orthotic/Fit training, DME instructions, Aquatic Therapy, Dry Needling, Electrical stimulation, Spinal manipulation, Spinal mobilization, Cryotherapy, Moist heat, scar mobilization, Splintting, Taping, Vasopneumatic device, Traction, Ultrasound, Ionotophoresis 4mg /ml Dexamethasone, Manual therapy, and Re-evaluation.  PLAN FOR NEXT SESSION: Aquatic: 1 more session for final instruction on HEP.  3 session land based for DN and further instruction on HEP.  Corrie Dandy Tomma Lightning) Ziemba MPT 04/17/23 1:55 PM

## 2023-04-17 NOTE — Progress Notes (Unsigned)
ASSESSMENT & PLAN   62 y.o.  female known to Dr. Lavon Paganini.   Miralax bowel purge today.  Stop Amitiza Trial of Trulance 3 mg daily  Hold daily Miralax for now  Contiue Dicylclomine. At 10 mg BID as needed.  Try the Robaxin prescribed by Urology. Would take at bedtime to avoid drowsiness.  Follow up as needed.    GERD   History of kidney stones, cholelithiasis status post cholecystectomy,    See PMH below for additional history  HPI   Brief GI history  Natalia's GI history includes IBS, chronic abdominal pain , GERD, Cholelithiasis, and colon polyps.  She has been seen several times for evaluation abdominal pain.  Her last visit was 11/14/2022 for evaluation of upper abdominal pain and diarrhea    Dicyclomine was no longer working pain but was helping the diarrhea.  Abdominal discomfort was felt to be secondary to exacerbation of constipation with overflow diarrhea.  She was advised to do a MiraLAX bowel purge followed by MiraLAX half a capful daily.  For GERD she was continued on Dexilant.  For abdominal cramping she was continued on dicyclomine 10 mg every 8 hours as needed.  She was due for polyp surveillance colonoscopy and that was scheduled   Chief complaint :         Procedure risk assessment:  No history of CHF.  No supplemental 02 use at home.  Not a known difficult airway Anticoagulant:     Previous GI Endoscopies / Labs / Imaging   **May not include all endoscopic evaluations   Colonoscopy February 2018, small adenomatous colon polyp removed and medium-sized hemorrhoids   EGD February 2018 showed hiatal hernia and gastritis.  Biopsies negative for H. pylori.  Duodenal biopsies negative for celiac.   Patient had work-up in [place], HIDA scan and small bowel series insert negative for any significant pathology  CT abdomen and pelvis with contrast December 2020 1. Obstructing 8 x 6 mm calculus at the left ureteropelvic junction resulting in  mild-to-moderate left hydronephrosis with small volume perinephric fluid and stranding. Extensive left-sided urothelial enhancement and stranding raises suspicion for a superimposed infectious or inflammatory process. 2. Thickened appearance of the urinary bladder, particularly along its superior margin. Recommend correlation with urinalysis. 3. Multiple nonobstructing right renal calculi, largest measuring up to 9 mm. 4. Cholelithiasis without evidence for cholecystitis. 5. Previously seen left adnexal cyst has slightly decreased in size.   Abdominal ultrasound Jan 02, 2020: 1. Gallstones without sonographic evidence for acute cholecystitis or biliary dilatation 2. Bilateral kidney stones without hydronephrosis 3. Complicated cyst upper pole left kidney measuring 1.3 cm, could be more thoroughly characterized by MRI.           Surveillance colonoscopy 12/02/2022/  Good bowel prep.  Nonbleeding internal hemorrhoids.  Exam otherwise normal       Latest Ref Rng & Units 03/31/2023    8:57 PM 11/09/2022   12:22 PM 07/27/2022    2:10 PM  Hepatic Function  Total Protein 6.5 - 8.1 g/dL 7.3  7.1  7.4   Albumin 3.5 - 5.0 g/dL 4.7  4.5  4.1   AST 15 - 41 U/L 28  23  21    ALT 0 - 44 U/L 26  29  28    Alk Phosphatase 38 - 126 U/L 54  74  61   Total Bilirubin 0.3 - 1.2 mg/dL 0.4  0.4  0.8        Latest Ref Rng &  Units 03/31/2023    8:57 PM 07/27/2022    2:10 PM 04/26/2022   10:50 AM  CBC  WBC 4.0 - 10.5 K/uL 7.5  12.5  6.0   Hemoglobin 12.0 - 15.0 g/dL 16.1  09.6  04.5   Hematocrit 36.0 - 46.0 % 40.3  41.7  40.4   Platelets 150 - 400 K/uL 342  368  306      Past Medical History:  Diagnosis Date   ADHD    Arthritis 2010   Asthma    Back pain    Chronic headaches    Chronic pain    Constipation    Edema of both lower extremities    Family history of breast cancer    Family history of prostate cancer    Gallstones 2018   GERD (gastroesophageal reflux disease)    High  cholesterol    History of kidney stones    Hx: UTI (urinary tract infection)    Hypertension    IBS (irritable bowel syndrome)    Interstitial cystitis 2012   Joint pain    Kidney problem    Migraine    Occipital neuralgia    Osteoarthritis    PNA (pneumonia) 2018   Pre-diabetes    Spinal stenosis    Trigeminal neuralgia     Past Surgical History:  Procedure Laterality Date   ABDOMINAL HYSTERECTOMY     ANKLE SURGERY Left 05/2009   APPENDECTOMY     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 Benard Rink at [facility]- [place]   BRAIN SURGERY     CARPAL TUNNEL RELEASE Right 2014   CHOLECYSTECTOMY N/A 12/24/2020   Procedure: LAPAROSCOPIC CHOLECYSTECTOMY;  Surgeon: Abigail Miyamoto, MD;  [facility] OR;  Service: General;  Laterality: N/A;   COLONOSCOPY     CYSTOSCOPY W/ URETERAL STENT PLACEMENT Left 07/25/2019   Procedure: CYSTOSCOPY WITH RETROGRADE PYELOGRAM/URETERAL STENT PLACEMENT;  Surgeon: Marcine Matar, MD;  Location: [facility] ORS;  Service: Urology;  Laterality: Left;   CYSTOSCOPY W/ URETERAL STENT REMOVAL Left 08/15/2019   Procedure: CYSTOSCOPY WITH STENT REMOVAL;  Surgeon: Marcine Matar, MD;  Location: [facility];  Service: Urology;  Laterality: Left;   CYSTOSCOPY WITH RETROGRADE PYELOGRAM, URETEROSCOPY AND STENT PLACEMENT Left 08/15/2019   Procedure: CYSTOSCOPY WITH RETROGRADE PYELOGRAM, URETEROSCOPY;  Surgeon: Marcine Matar, MD;  Location: [facility];  Service: Urology;  Laterality: Left;  90 MINS   CYSTOSCOPY WITH RETROGRADE PYELOGRAM, URETEROSCOPY AND STENT PLACEMENT Right 09/12/2019   Procedure: CYSTOSCOPY WITH RIGHT RETROGRADE PYELOGRAM  URETEROSCOPY WITH HOLMIUM LASER STONE EXTRACTION AND STENT PLACEMENT;  Surgeon: Marcine Matar, MD;  Location: [facility];  Service: Urology;  Laterality: Right;   DILATION AND CURETTAGE OF UTERUS     HOLMIUM LASER APPLICATION Left 08/15/2019   Procedure: HOLMIUM LASER APPLICATION;  Surgeon: Marcine Matar, MD;  Location: [facility];  Service: Urology;  Laterality: Left;   JOINT REPLACEMENT     R knee   KIDNEY STONE SURGERY     neuro spine similator  01/2021   OVARIAN CYST REMOVAL  05/2010   right knee revision  2016   ROTATOR CUFF REPAIR Right 02/2019   SPINE SURGERY     x 3   TUBAL LIGATION      Family History  Problem Relation Age of Onset   Ulcers Mother 30       peptic ulcer   Irritable bowel syndrome Mother    High blood pressure Mother    Sudden  death Mother    Depression Mother    Prostate cancer Father 21   Heart disease Father    Irritable bowel syndrome Father    High blood pressure Father    Breast cancer Sister 29       ER+/PR+/Her2- breast cancer   Alzheimer's disease Sister    Hypertension Sister    Kidney disease Sister    Celiac disease Sister    Thyroid disease Sister    Breast cancer Paternal Grandmother    Diabetes Paternal Grandmother    Cancer Paternal Uncle        unknown cancers   Liver disease Neg Hx    Esophageal cancer Neg Hx    Colon cancer Neg Hx     Current Medications, Allergies, Family History and Social History were reviewed in [employer] electronic medical record.     Current Outpatient Medications  Medication Sig Dispense Refill   ACETAMINOPHEN PO Take 1,300 mg by mouth 2 (two) times daily.     albuterol (VENTOLIN HFA) 108 (90 Base) MCG/ACT inhaler Inhale 2 puffs into the lungs every 6 (six) hours as needed for wheezing or shortness of breath. 8 g 6   AMBULATORY NON FORMULARY MEDICATION 1 tablet 2 (two) times daily. Medication Name: Migrelief     amitriptyline (ELAVIL) 50 MG tablet Take 1 tablet (50 mg total) by mouth at bedtime. 90 tablet 0   atorvastatin (LIPITOR) 20 MG tablet TAKE 1 TABLET BY MOUTH EVERY DAY 90 tablet 0   botulinum toxin Type A (BOTOX) 200 units injection INJECT 155 UNITS INTRAMUSCULARLY EVERY 12 WEEKS (GIVEN AT MD  OFFICE, DISCARD UNUSED) 1 each 1   budesonide-formoterol (SYMBICORT) 80-4.5 MCG/ACT  inhaler Inhale 2 puffs into the lungs 2 (two) times daily as needed (asthma). 1 each 11   celecoxib (CELEBREX) 200 MG capsule Take 200 mg by mouth 2 (two) times daily.     dexlansoprazole (DEXILANT) 60 MG capsule Take 1 capsule by mouth daily.     dicyclomine (BENTYL) 10 MG capsule Take 1 by mouth twice daily. (Patient taking differently: Take 20 mg by mouth in the morning and at bedtime. PRN) 60 capsule 11   fluticasone (FLONASE) 50 MCG/ACT nasal spray Place 2 sprays into both nostrils daily. 16 g 11   Fremanezumab-vfrm (AJOVY) 225 MG/1.5ML SOAJ Inject 225 mg into the skin every 30 (thirty) days. 4.5 mL 3   gabapentin (NEURONTIN) 300 MG capsule Take 3 capsules (900 mg total) by mouth 3 (three) times daily. 270 capsule 0   lisinopril (ZESTRIL) 10 MG tablet Take 10 mg by mouth daily.     lubiprostone (AMITIZA) 24 MCG capsule Take 1 capsule (24 mcg total) by mouth 2 (two) times daily with a meal. 180 capsule 3   montelukast (SINGULAIR) 10 MG tablet TAKE 1 TABLET BY MOUTH EVERYDAY AT BEDTIME 90 tablet 3   Omega-3 Fatty Acids (OMEGA III EPA+DHA) 1000 MG CAPS Take by mouth.     oxyCODONE-acetaminophen (PERCOCET/ROXICET) 5-325 MG tablet Take 1 tablet by mouth every 6 (six) hours as needed for severe pain. 10 tablet 0   Spacer/Aero-Holding Chambers (AEROCHAMBER MV) inhaler Use as instructed 1 each 0   valACYclovir (VALTREX) 500 MG tablet Take 500 mg by mouth 2 (two) times daily as needed (outbreaks).      No current facility-administered medications for this visit.    Review of Systems: No chest pain. No shortness of breath. No urinary complaints.    Physical Exam  Wt Readings from Last  3 Encounters:  03/31/23 180 lb (81.6 kg)  01/17/23 175 lb (79.4 kg)  12/05/22 175 lb 12.8 oz (79.7 kg)    There were no vitals taken for this visit. Constitutional:  Pleasant, generally well appearing ***female in no acute distress. Psychiatric: Normal mood and affect. Behavior is normal. EENT: Pupils normal.   Conjunctivae are normal. No scleral icterus. Neck supple.  Cardiovascular: Normal rate, regular rhythm.  Pulmonary/chest: Effort normal and breath sounds normal. No wheezing, rales or rhonchi. Abdominal: Soft, nondistended, nontender. Bowel sounds active throughout. There are no masses palpable. No hepatomegaly. Neurological: Alert and oriented to person place and time.  Extremities: *** edema  Willette Cluster, NP  04/17/2023, 1:29 PM  Cc:  Woodfin Ganja, MD

## 2023-04-18 ENCOUNTER — Encounter (HOSPITAL_BASED_OUTPATIENT_CLINIC_OR_DEPARTMENT_OTHER): Payer: Self-pay | Admitting: Physical Therapy

## 2023-04-18 ENCOUNTER — Encounter: Payer: Self-pay | Admitting: Nurse Practitioner

## 2023-04-19 ENCOUNTER — Other Ambulatory Visit: Payer: Self-pay

## 2023-04-19 ENCOUNTER — Telehealth: Payer: Self-pay | Admitting: Internal Medicine

## 2023-04-19 MED ORDER — LINACLOTIDE 290 MCG PO CAPS
290.0000 ug | ORAL_CAPSULE | Freq: Every day | ORAL | 0 refills | Status: DC
Start: 2023-04-19 — End: 2023-09-26

## 2023-04-19 MED ORDER — GLYCOPYRROLATE 2 MG PO TABS: 2.0000 mg | ORAL_TABLET | Freq: Two times a day (BID) | ORAL | 3 refills | Status: DC

## 2023-04-19 NOTE — Telephone Encounter (Signed)
GI ON CALL PHONE NOTE  The patient called complaining of ongoing abdominal pain. She wants to know the etiology and solution. She contacted the office today regarding the same, and was upset that she did not get a call back.  Chart reviewed, including recent ER evaluation, prior endoscopic evaluations and OV just 2 days ago. Plans reviewed.  As it turns out, she WAS left a My Chart message today, but did not check it...  I referred her to the message and the updated recommendations. She was grateful.   Wilhemina Bonito. Eda Keys., M.D. [facility] Division of Gastroenterology

## 2023-04-19 NOTE — Addendum Note (Signed)
Addended by: Sunday Spillers on: 04/19/2023 03:41 PM   Modules accepted: Orders

## 2023-04-20 ENCOUNTER — Ambulatory Visit: Payer: 59 | Admitting: Physician Assistant

## 2023-04-20 NOTE — Telephone Encounter (Signed)
Noted. Thanks for your help Irving Burton.

## 2023-04-21 ENCOUNTER — Ambulatory Visit (HOSPITAL_BASED_OUTPATIENT_CLINIC_OR_DEPARTMENT_OTHER): Payer: Medicare Other | Admitting: Physical Therapy

## 2023-04-21 ENCOUNTER — Encounter (HOSPITAL_BASED_OUTPATIENT_CLINIC_OR_DEPARTMENT_OTHER): Payer: Self-pay | Admitting: Physical Therapy

## 2023-04-21 DIAGNOSIS — M545 Low back pain, unspecified: Secondary | ICD-10-CM

## 2023-04-21 DIAGNOSIS — R262 Difficulty in walking, not elsewhere classified: Secondary | ICD-10-CM

## 2023-04-21 DIAGNOSIS — M6281 Muscle weakness (generalized): Secondary | ICD-10-CM | POA: Diagnosis not present

## 2023-04-21 NOTE — Therapy (Signed)
OUTPATIENT PHYSICAL THERAPY THORACOLUMBAR TREATMENT     Patient Name: Cassidy Olsen MRN: [id number] DOB:11-28-1960, 62 y.o., female Today's Date: 04/23/2023  END OF SESSION:  PT End of Session - 04/23/23 0842     Visit Number 17    Number of Visits 23    Date for PT Re-Evaluation 06/02/23    PT Start Time 0855   Patient 10 min late   PT Stop Time 0930    PT Time Calculation (min) 35 min    Activity Tolerance Patient limited by pain    Behavior During Therapy [employer] for tasks assessed/performed                 Past Medical History:  Diagnosis Date   ADHD    Arthritis 2010   Asthma    Back pain    Chronic headaches    Chronic pain    Constipation    Edema of both lower extremities    Family history of breast cancer    Family history of prostate cancer    Gallstones 2018   GERD (gastroesophageal reflux disease)    High cholesterol    History of kidney stones    Hx: UTI (urinary tract infection)    Hypertension    IBS (irritable bowel syndrome)    Interstitial cystitis 2012   Joint pain    Kidney problem    Migraine    Occipital neuralgia    Osteoarthritis    PNA (pneumonia) 2018   Posttraumatic muscle contracture (HCC)    Pre-diabetes    Spinal stenosis    Trigeminal neuralgia    Past Surgical History:  Procedure Laterality Date   ABDOMINAL HYSTERECTOMY     ANKLE SURGERY Left 05/2009   APPENDECTOMY     Benign Tumor Removal Right 01/26/2017   right upper arm by Dr. Benard Rink at [facility] [place]   BRAIN SURGERY     CARPAL TUNNEL RELEASE Right 2014   CHOLECYSTECTOMY N/A 12/24/2020   Procedure: LAPAROSCOPIC CHOLECYSTECTOMY;  Surgeon: Abigail Miyamoto, MD;  Location: [facility] OR;  Service: General;  Laterality: N/A;   COLONOSCOPY     CYSTOSCOPY W/ URETERAL STENT PLACEMENT Left 07/25/2019   Procedure: CYSTOSCOPY WITH RETROGRADE PYELOGRAM/URETERAL STENT PLACEMENT;  Surgeon: Marcine Matar, MD;  Location: [facility];  Service: Urology;  Laterality:  Left;   CYSTOSCOPY W/ URETERAL STENT REMOVAL Left 08/15/2019   Procedure: CYSTOSCOPY WITH STENT REMOVAL;  Surgeon: Marcine Matar, MD;  Location: [facility];  Service: Urology;  Laterality: Left;   CYSTOSCOPY WITH RETROGRADE PYELOGRAM, URETEROSCOPY AND STENT PLACEMENT Left 08/15/2019   Procedure: CYSTOSCOPY WITH RETROGRADE PYELOGRAM, URETEROSCOPY;  Surgeon: Marcine Matar, MD;  Location: [facility];  Service: Urology;  Laterality: Left;  90 MINS   CYSTOSCOPY WITH RETROGRADE PYELOGRAM, URETEROSCOPY AND STENT PLACEMENT Right 09/12/2019   Procedure: CYSTOSCOPY WITH RIGHT RETROGRADE PYELOGRAM  URETEROSCOPY WITH HOLMIUM LASER STONE EXTRACTION AND STENT PLACEMENT;  Surgeon: Marcine Matar, MD;  Location: [facility];  Service: Urology;  Laterality: Right;   DILATION AND CURETTAGE OF UTERUS     HOLMIUM LASER APPLICATION Left 08/15/2019   Procedure: HOLMIUM LASER APPLICATION;  Surgeon: Marcine Matar, MD;  Location: [facility];  Service: Urology;  Laterality: Left;   JOINT REPLACEMENT     R knee   KIDNEY STONE SURGERY     neuro spine similator  01/2021   OVARIAN CYST REMOVAL  05/2010   right knee revision  2016   ROTATOR CUFF REPAIR Right 02/2019   SPINE SURGERY  x 3   TUBAL LIGATION     Patient Active Problem List   Diagnosis Date Noted   Gastroesophageal reflux disease 12/18/2022   Spinal stenosis 12/18/2022   Obesity: Starting BMI 33.3 11/09/2022   BMI 30.0-30.9,adult Current BMI 30.9 11/09/2022   Hypertension, essential 07/26/2022   Class 1 obesity with serious comorbidity and body mass index (BMI) of 33.0 to 33.9 in adult 06/23/2022   Mood disorder (HCC)- with emotional eating 05/23/2022   Mixed hyperlipidemia 05/23/2022   Prediabetes 04/26/2022   Depression 04/26/2022   Allergic rhinitis 01/26/2022   Morbid obesity (HCC) 09/24/2021   Exertional chest pain 09/24/2021   S/P laparoscopic cholecystectomy 12/24/2020   RUQ abdominal pain 12/25/2019   Decreased range of knee movement  09/02/2019   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   Irritable bowel syndrome with constipation 09/02/2019   Calculus, ureter 07/24/2019   Chronic migraine without aura, with intractable migraine, so stated, with status migrainosus 05/30/2019   Chronic asthmatic bronchitis 05/14/2019   Excessive daytime sleepiness 01/08/2019   Upper airway cough syndrome 11/12/2018   Acute bacterial sinusitis 07/17/2018   Abnormal chest CT 07/17/2018   Trigeminal neuralgia of right side of face 02/01/2018   Occipital neuralgia 02/01/2018   Chronic migraine without aura without status migrainosus, not intractable 02/01/2018   Decreased strength 05/10/2017   Family history of breast cancer    Family history of prostate cancer    Chronic left shoulder pain 09/02/2015   Carpal tunnel syndrome 08/25/2011   Asthma 04/14/2009   Essential hypertension 04/14/2009   Hyperlipidemia 04/14/2009    PCP: Woodfin Ganja, MD   REFERRING PROVIDER: Venita Lick, MD  REFERRING DIAG:  Diagnosis  M54.59 (ICD-10-CM) - Other low back pain    Rationale for Evaluation and Treatment: Rehabilitation  THERAPY DIAG:  Pain, lumbar region  Muscle weakness (generalized)  Difficulty walking  ONSET DATE: March 2024  SUBJECTIVE:                                                                                                                                                                                           SUBJECTIVE STATEMENT: The patient reports that needling last session was effective in her lower back.  She had the pain after but the pain resolved after a day and was improved.  She continues to have pain in her abdomen.  She has been to her GI doctor who also feels like it may have been musculoskeletal origin.  Of note, she reports increased pain on her left side when she eats.  She has been avoiding eating certain foods.  She has been put on a  different medication.  She will try that for 2 weeks and  then follow-up with her GI doctor.  She is going on vacation for 3 weeks.  She would like to review gym activity today.  When she returns from her trip she will follow-up from 1 more visit before joining joining the gym.   Pt states the back feels better but does continue to have pain and difficulty with lifting. Pt had RFA recently and ablation scheduled for 8/14 for L4. Pt is in the CAM boot for the L foot. She has seen podiatrist and is in boot for the next 7 wks. Pt believes that aquatics has helped a lot. Pt is seeing chiro for her neck and is interested in PT for the neck at some point. 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   PERTINENT HISTORY:  2002 lumar micro discectomy  2007 C6-7 Fusion 2014 L5-S1 fusion  2015-2016 R knee TKA and revision  2022 lumbar nerve stimulator  PAIN:  Are you having pain? Yes: NPRS scale: 5/10 Pain location: localized right side of abdomen Pain description: achy, sharp Aggravating factors: Comes and goes Relieving factors: Pressure provides brief relief  PRECAUTIONS: nerve stimulator  WEIGHT BEARING RESTRICTIONS: No  FALLS:  Has patient fallen in last 6 months? Yes. 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 At L3-4 there is a broad-based disc bulge. Severe bilateral facet arthropathy. Moderate spinal stenosis. Bilateral subarticular recess stenosis. Moderate left foraminal stenosis. Moderate-severe right foraminal stenosis. 2. At L4-5 there  is a broad-based disc bulge. Moderate bilateral facet arthropathy with bilateral mild facet effusions. Severe spinal stenosis. Severe right foraminal stenosis. Mild-moderate left foraminal stenosis. 3. Posterior lumbar fusion at L5-S1 with a broad-based disc bulge. No spinal stenosis. Mild bilateral foraminal stenosis. 4. No acute osseous injury of the lumbar spine.   Fusion hardware noted at L5-S1 no abnormalities noted adjacent segment disease noted at L4-5 and L3-4.    PATIENT SURVEYS:  FOTO 32 40 @ DC 9 MCII  01/26/23:  FOTO 47 7/18 52 FOTO GOAL MET  SCREENING FOR RED FLAGS: Bowel or bladder incontinence: No Spinal tumors: No Cauda equina syndrome: No Compression fracture: No Abdominal aneurysm: No  COGNITION: Overall cognitive status: Within functional limits for tasks assessed     SENSATION: Light touch: Impaired   POSTURE: rounded shoulders and decreased lumbar lordosis  PALPATION: TTP of L lumbar paraspinals and L QL; hypertonicity of bilat lumbar paraspinals and QL  LUMBAR ROM:   AROM 7/18  Flexion 60% p! On L   Extension 50% R sided p!  Right lateral flexion 50% p!  Left lateral flexion 50%  Right rotation 75%  L p!  Left rotation 60%   (Blank rows = not tested)   LOWER EXTREMITY MMT:  4/5 throughout with mild pain into low back   LUMBAR SPECIAL TESTS:  Straight leg raise test: Positive, FABER test: Positive, and Trendelenburg sign: Positive  FUNCTIONAL TESTS:  5  times sit to stand: 17.2s 01/26/23: 5x STS at bench  13.9s  02/23/23: 5x STS at bench  12.5s 04/04/23: 5x STS at bench   11.8s    GAIT: Distance walked: 55ft Assistive device utilized: None Level of assistance: Complete Independence Comments: L antalgic gait due to booka  TODAY'S TREATMENT:                                                                                                                              DATE:  9/13  Manual:Psoas release in supine.  Reviewed self psoas  release.  Reviewed release with self stretching.  Skilled palpation of trigger points.    Reviewed set up of gym equipment.  Reviewed how to grade weights properly.  Patient had increased abdominal pain with pulling activities  LF Row 15 lbs x10 LF hip abduction 40 lbs  LF Leg Press 25 lbs x12  Cable extension 15 lbs   Reviewed HEP going forward  9/10 Manual: Psoas release in supine.  Reviewed self psoas release.  Reviewed release with self stretching.  Skilled palpation of trigger points.  Trigger point release to bilateral gluteals.  Trigger Point Dry-Needling  Treatment instructions: Expect mild to moderate muscle soreness. S/S of pneumothorax if dry needled over a lung field, and to seek immediate medical attention should they occur. Patient verbalized understanding of these instructions and education.  Patient Consent Given: Yes Education handout provided: Yes Muscles treated: bilateral gluteal 1x on the right 2x on the left using a .30x75 needle Electrical stimulation performed: No Parameters: N/A Treatment response/outcome: great twitch care taken on the right to stay away from stimulator.   Reviewed HEP. Thomas stretch 3 x 20-second hold Standing anterior hip stretch 2 x 20-second hold   04/06/23  Pt seen for aquatic therapy today.  Treatment took place in water 3.5-4.75 ft in depth at the [facility] pool. Temp of water was 91.  Pt entered/exited the pool via stairs independently with bilat rail.   Exercises - Hand Buoy Carry   - Side lunge with hand buoys  - Standing 'L' Stretch at [employer]   - Standing Hip Hinge  - Drawing Bow  - Noodle press  - Seated Straddle on [employer] Breast Stroke Arms and Bicycle Legs   - Plank on Long Hand Float   - Plank on Long Psychologist, sport and exercise with Leg Lift   - Plank on [employer] with Arm Lifts x 5  Reps varied according to toleration  Pt requires the buoyancy and hydrostatic pressure of water for support, and to  offload joints by unweighting joint load by at least 50 % in navel deep water and by at least 75-80% in chest to neck deep water.  Viscosity of the water is needed for resistance of strengthening. Water current perturbations provides challenge to standing balance requiring increased core activation.   8/22  Trigger Point Dry-Needling  Treatment instructions: Expect mild to moderate  muscle soreness. S/S of pneumothorax if dry needled over a lung field, and to seek immediate medical attention should they occur. Patient verbalized understanding of these instructions and education.  Patient Consent Given: Yes Education handout provided: Yes Muscles treated: bilateral gluteal 1x on the right 2x on the left using a .30x75 needle Electrical stimulation performed: No Parameters: N/A Treatment response/outcome: great twitch care taken on the right to stay away from stimulator.   Manual: trigger point release to gluteal bilateral; skilled palpation of trigger points   Seated clamshell 2x15 red with cuing for sets /reps/RPE   Reviewed self soft tissue mobilization using the thera-can and the tennis ball       PATIENT EDUCATION:  Education details: aquatic therapy progressions and modifications   Person educated: Patient Education method: Explanation, Demonstration, Tactile cues, Verbal cues,  Education comprehension: verbalized understanding, returned demonstration, verbal cues required, tactile cues required, and needs further education  HOME EXERCISE PROGRAM:  Access Code: [facility] URL: [email or web address] Date: 01/03/2023 Prepared by: Zebedee Iba  Access Code: [email or web address] URL: [email or web address] Date: 03/28/2023 Prepared by: Geni Bers  Exercises - Hand Buoy Carry  - 1 x daily - 7 x weekly - 3 sets - 10 reps - Side lunge with hand buoys  - 1 x daily - 7 x weekly - 3 sets - 10 reps - Standing 'L' Stretch at [employer]  - 1 x daily - 7 x weekly - 3  sets - 10 reps - Standing Hip Hinge  - 1 x daily - 7 x weekly - 3 sets - 10 reps - Drawing Bow  - 1 x daily - 7 x weekly - 3 sets - 10 reps - Noodle press  - 1 x daily - 7 x weekly - 3 sets - 10 reps - Seated Straddle on Flotation Forward Breast Stroke Arms and Bicycle Legs  - 1 x daily - 7 x weekly - 3 sets - 10 reps - Plank on Long Hand Float  - 1 x daily - 7 x weekly - 3 sets - 10 reps - Plank on Long [employer] with Leg Lift  - 1 x daily - 7 x weekly - 3 sets - 10 reps - Plank on Long [employer] with Arm Lifts  - 1 x daily - 7 x weekly - 3 sets - 10 reps    ASSESSMENT:  CLINICAL IMPRESSION: Overall the patient has made progress.  Her low back is hurting her less than when she came in.  She does more activity around the home.  Over the past 3 weeks she has developed significant onset of abdominal pain.  She has been told by her GI doctor and urologist that this seems to be a psoas pain.  She does have tenderness to palpation of the psoas but is difficult to differentiate between potential GI cause.  She will continue with medication given by the GI for 2 weeks and then follow-up. she has transient relief with trigger point release to that area.  She would like to join the gym.  We reviewed gym activities today.  She did well with the leg activities but had some increased pain in her abdomen with anything that while pulling even at a light weight.  She will be going to [place] and [place] for 3 weeks.  She will return for a follow-up visit when she returns.  At that time we will continue to work on developing a gym program 4.  She is advised to  seek medical treatment if her abdominal pain continues to increase.  PN:Pt reports baseline pain is down ~ 3-4 NPRS. She is compliant with land based exercises as instructed and is issued aquatic final HEP today. She completes with vc and some demonstration but no increase in pain. She has met all STG's and is progressing toward all LTGs.  She continues to  manage her chronic pain daily which does wax and wane. She continues to be limited in amb due to pain. DN provided good relief of ms spasms and aquatic intervention has allowed for overall pain sensitivity to decline. She will continue to benefit from skilled physical therapy intervention for a few more visits, 1 in aquatics where she will have met her max potential then x 3 land based to further reach all goals set.     FROM EVAL: Patient is a 62 y.o. female who was seen today for physical therapy evaluation and treatment for c/c of chronic LBP. Pt's s/s appear consistent with lumbar radiculopathy and complex presentation given long standing history of LBP, multiple surgeries, as well as several concomitant pain syndromes contributing to her chronic pain. Pt's pain is highly sensitive and irritable with movement. Pt's is more pain dominant at this time. Plan to continue with aquatic therapy and land therapy for lumbar mobility and general functional mobility/LE strength at future sessions. Plan for progressive strengthening as pt's previous PT episodes did not target resistance training. Pt would benefit from continued skilled therapy in order to reach goals and maximize functional lumbopelvic strength and ROM for prevention of further functional decline.    OBJECTIVE IMPAIRMENTS: decreased activity tolerance, decreased endurance, decreased mobility, decreased ROM, decreased strength, hypomobility, increased muscle spasms, impaired flexibility, improper body mechanics, postural dysfunction, and pain.   ACTIVITY LIMITATIONS: carrying, lifting, sleeping, transfers, dressing, and bending, squatting  PARTICIPATION LIMITATIONS: cleaning, laundry, yard work, community activity, and exercise  PERSONAL FACTORS: Age, Fitness, Time since onset of injury/illness/exacerbation, and 3+ comorbidities:  are also affecting patient's functional outcome.   REHAB POTENTIAL: Fair  CLINICAL DECISION MAKING:  unstable/complicated  EVALUATION COMPLEXITY: Moderate   SHORT TERM GOALS: Target date: 02/14/2023   Pt will become independent with HEP in order to demonstrate synthesis of PT education.   Goal status: Met 04/04/23  2.  Pt will report at least 2 pt reduction on NPRS scale for pain in order to demonstrate functional improvement with household activity, self care, and ADL.   Goal status: Met 04/04/23  3. Pt will score at least 9 pt increase on FOTO to demonstrate functional improvement in MCII and pt perceived function.      Goal status:MET - 01/26/23  LONG TERM GOALS: Target date: 04/21/23    Pt  will become independent with final HEP in order to demonstrate synthesis of PT education.  Baseline:  Goal status: we will continue to progress gym program 09/13  2. Pt will score >/= 40 on FOTO to demonstrate improvement in perceived lumbar function.  Baseline:  Goal status: MET- 01/26/23  3.  Pt will be able to perform 5XSTS in under 12s  in order to demonstrate functional improvement above the cut off score for adults.  Goal status: IN PROGRESS  -01/26/23; MET 04/04/23  4.  Pt will be able to demonstrate/report ability to walk >15 mins without pain in order to demonstrate functional improvement and tolerance to exercise and community mobility.   Goal status: Ongoing PLAN:  PT FREQUENCY: 1-2x/week  PT DURATION: 3 weeks  PLANNED INTERVENTIONS:  Therapeutic exercises, Therapeutic activity, Neuromuscular re-education, Balance training, Gait training, Patient/Family education, Self Care, Joint mobilization, Joint manipulation, Orthotic/Fit training, DME instructions, Aquatic Therapy, Dry Needling, Electrical stimulation, Spinal manipulation, Spinal mobilization, Cryotherapy, Moist heat, scar mobilization, Splintting, Taping, Vasopneumatic device, Traction, Ultrasound, Ionotophoresis 4mg /ml Dexamethasone, Manual therapy, and Re-evaluation.  PLAN FOR NEXT SESSION: Aquatic: 1 more session for  final instruction on HEP.  3 session land based for DN and further instruction on HEP. Finalize gym program when the patient comes back.   Lorayne Bender PT DPT 04/23/23 8:44 AM

## 2023-04-23 ENCOUNTER — Encounter (HOSPITAL_BASED_OUTPATIENT_CLINIC_OR_DEPARTMENT_OTHER): Payer: Self-pay | Admitting: Physical Therapy

## 2023-04-25 ENCOUNTER — Ambulatory Visit: Payer: Medicare Other | Admitting: Neurology

## 2023-05-02 ENCOUNTER — Ambulatory Visit: Payer: Medicare Other | Admitting: Gastroenterology

## 2023-05-15 ENCOUNTER — Encounter (HOSPITAL_BASED_OUTPATIENT_CLINIC_OR_DEPARTMENT_OTHER): Payer: Self-pay | Admitting: Physical Therapy

## 2023-05-15 ENCOUNTER — Ambulatory Visit (HOSPITAL_BASED_OUTPATIENT_CLINIC_OR_DEPARTMENT_OTHER): Payer: Medicare Other | Attending: Orthopedic Surgery | Admitting: Physical Therapy

## 2023-05-15 DIAGNOSIS — Z79899 Other long term (current) drug therapy: Secondary | ICD-10-CM | POA: Diagnosis not present

## 2023-05-15 DIAGNOSIS — G894 Chronic pain syndrome: Secondary | ICD-10-CM | POA: Diagnosis not present

## 2023-05-15 DIAGNOSIS — Z5181 Encounter for therapeutic drug level monitoring: Secondary | ICD-10-CM | POA: Diagnosis not present

## 2023-05-15 DIAGNOSIS — M5416 Radiculopathy, lumbar region: Secondary | ICD-10-CM | POA: Diagnosis not present

## 2023-05-15 DIAGNOSIS — M47812 Spondylosis without myelopathy or radiculopathy, cervical region: Secondary | ICD-10-CM | POA: Diagnosis not present

## 2023-05-15 DIAGNOSIS — M961 Postlaminectomy syndrome, not elsewhere classified: Secondary | ICD-10-CM | POA: Diagnosis not present

## 2023-05-16 DIAGNOSIS — M545 Low back pain, unspecified: Secondary | ICD-10-CM | POA: Diagnosis not present

## 2023-05-18 DIAGNOSIS — E66811 Obesity, class 1: Secondary | ICD-10-CM | POA: Diagnosis not present

## 2023-05-18 DIAGNOSIS — R7303 Prediabetes: Secondary | ICD-10-CM | POA: Diagnosis not present

## 2023-05-18 DIAGNOSIS — K589 Irritable bowel syndrome without diarrhea: Secondary | ICD-10-CM | POA: Diagnosis not present

## 2023-05-18 DIAGNOSIS — I10 Essential (primary) hypertension: Secondary | ICD-10-CM | POA: Diagnosis not present

## 2023-05-18 DIAGNOSIS — Z6832 Body mass index (BMI) 32.0-32.9, adult: Secondary | ICD-10-CM | POA: Diagnosis not present

## 2023-05-29 ENCOUNTER — Other Ambulatory Visit: Payer: Self-pay | Admitting: *Deleted

## 2023-05-29 DIAGNOSIS — M5416 Radiculopathy, lumbar region: Secondary | ICD-10-CM | POA: Diagnosis not present

## 2023-05-29 MED ORDER — AMITRIPTYLINE HCL 50 MG PO TABS: 50.0000 mg | ORAL_TABLET | Freq: Every day | ORAL | 0 refills | Status: DC

## 2023-05-29 NOTE — Telephone Encounter (Signed)
Last seen on 03/29/23 Follow up scheduled on 06/15/23

## 2023-06-13 ENCOUNTER — Other Ambulatory Visit: Payer: Self-pay

## 2023-06-13 DIAGNOSIS — M47812 Spondylosis without myelopathy or radiculopathy, cervical region: Secondary | ICD-10-CM | POA: Diagnosis not present

## 2023-06-14 ENCOUNTER — Telehealth: Payer: Self-pay | Admitting: Family Medicine

## 2023-06-14 ENCOUNTER — Telehealth: Payer: Self-pay | Admitting: Neurology

## 2023-06-14 MED ORDER — AJOVY 225 MG/1.5ML ~~LOC~~ SOAJ: 225.0000 mg | SUBCUTANEOUS | 3 refills | Status: DC

## 2023-06-14 NOTE — Telephone Encounter (Signed)
Rescheduling appt due to having a cough, sore throat, headache, and ear ache

## 2023-06-14 NOTE — Telephone Encounter (Signed)
E-scribed refill 

## 2023-06-14 NOTE — Telephone Encounter (Signed)
Pt request refill for Fremanezumab-vfrm (AJOVY) 225 MG/1.5ML SOAJ send to  [facility] [phone]

## 2023-06-15 ENCOUNTER — Ambulatory Visit: Payer: Medicare Other | Admitting: Neurology

## 2023-06-16 ENCOUNTER — Other Ambulatory Visit: Payer: Self-pay

## 2023-06-19 ENCOUNTER — Other Ambulatory Visit (HOSPITAL_COMMUNITY): Payer: Self-pay | Admitting: Pharmacy Technician

## 2023-06-19 ENCOUNTER — Other Ambulatory Visit: Payer: Self-pay

## 2023-06-19 ENCOUNTER — Other Ambulatory Visit (HOSPITAL_COMMUNITY): Payer: Self-pay

## 2023-06-19 ENCOUNTER — Other Ambulatory Visit: Payer: Self-pay | Admitting: Family Medicine

## 2023-06-19 DIAGNOSIS — Z471 Aftercare following joint replacement surgery: Secondary | ICD-10-CM | POA: Diagnosis not present

## 2023-06-19 DIAGNOSIS — G43709 Chronic migraine without aura, not intractable, without status migrainosus: Secondary | ICD-10-CM

## 2023-06-19 DIAGNOSIS — Z96651 Presence of right artificial knee joint: Secondary | ICD-10-CM | POA: Diagnosis not present

## 2023-06-19 MED ORDER — BOTOX 200 UNITS IJ SOLR
INTRAMUSCULAR | 1 refills | Status: DC
Start: 2023-06-19 — End: 2023-12-27
  Filled 2023-06-19: qty 1, 84d supply, fill #0
  Filled 2023-09-25 (×2): qty 1, 84d supply, fill #1

## 2023-06-19 NOTE — Telephone Encounter (Signed)
Last seen on 03/29/23 Follow up scheduled on 06/26/23

## 2023-06-19 NOTE — Progress Notes (Signed)
Specialty Pharmacy Refill Coordination Note  Nadira Ambrosino is a 62 y.o. female contacted today regarding refills of specialty medication(s) Onabotulinumtoxina   Patient requested Courier to Provider Office   Delivery date: 06/21/23   Verified address: [facility] [address]   Medication will be filled on 06/20/23.  Refill Request sent to MD.

## 2023-06-21 DIAGNOSIS — J302 Other seasonal allergic rhinitis: Secondary | ICD-10-CM | POA: Diagnosis not present
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
## 2023-06-22 ENCOUNTER — Other Ambulatory Visit (HOSPITAL_COMMUNITY): Payer: Self-pay | Admitting: Orthopedic Surgery

## 2023-06-22 DIAGNOSIS — M533 Sacrococcygeal disorders, not elsewhere classified: Secondary | ICD-10-CM | POA: Diagnosis not present

## 2023-06-22 DIAGNOSIS — Z96651 Presence of right artificial knee joint: Secondary | ICD-10-CM

## 2023-06-22 DIAGNOSIS — M47812 Spondylosis without myelopathy or radiculopathy, cervical region: Secondary | ICD-10-CM | POA: Diagnosis not present

## 2023-06-22 DIAGNOSIS — G8929 Other chronic pain: Secondary | ICD-10-CM | POA: Diagnosis not present

## 2023-06-26 ENCOUNTER — Telehealth: Payer: Self-pay | Admitting: Pulmonary Disease

## 2023-06-26 ENCOUNTER — Ambulatory Visit (INDEPENDENT_AMBULATORY_CARE_PROVIDER_SITE_OTHER): Payer: Medicare Other | Admitting: Family Medicine

## 2023-06-26 DIAGNOSIS — G43709 Chronic migraine without aura, not intractable, without status migrainosus: Secondary | ICD-10-CM | POA: Diagnosis not present

## 2023-06-26 MED ORDER — ONABOTULINUMTOXINA 200 UNITS IJ SOLR
155.0000 [IU] | Freq: Once | INTRAMUSCULAR | Status: AC
Start: 2023-06-26 — End: 2023-06-26
  Administered 2023-06-26: 155 [IU] via INTRAMUSCULAR

## 2023-06-26 NOTE — Telephone Encounter (Signed)
PT needs Symbicort refill. Has made FU appt. Last seen 10/23   Pharm is [facility] [address]  Her CB # is [phone]

## 2023-06-26 NOTE — Progress Notes (Signed)
 Botox- 200 units x 1 vial Lot: [id number] Expiration: 09/2025 NDC: 7829-5621-30  Bacteriostatic 0.9% Sodium Chloride- * mL  Lot: [id number] Expiration: 11/07/2023 NDC: 6962-9528-41  Dx: L24.401 S/P  Witnessed by Ted Mcalpine, RMA

## 2023-06-28 ENCOUNTER — Encounter (HOSPITAL_COMMUNITY)
Admission: RE | Admit: 2023-06-28 | Discharge: 2023-06-28 | Disposition: A | Discharge status: Home / Self Care | Payer: Medicare Other | Source: Ambulatory Visit | Attending: Orthopedic Surgery | Admitting: Orthopedic Surgery

## 2023-06-28 DIAGNOSIS — Z01419 Encounter for gynecological examination (general) (routine) without abnormal findings: Secondary | ICD-10-CM | POA: Diagnosis not present

## 2023-06-28 DIAGNOSIS — Z96651 Presence of right artificial knee joint: Secondary | ICD-10-CM | POA: Diagnosis not present

## 2023-06-28 DIAGNOSIS — A6000 Herpesviral infection of urogenital system, unspecified: Secondary | ICD-10-CM | POA: Diagnosis not present

## 2023-06-28 DIAGNOSIS — Z23 Encounter for immunization: Secondary | ICD-10-CM | POA: Diagnosis not present

## 2023-06-28 DIAGNOSIS — M25561 Pain in right knee: Secondary | ICD-10-CM | POA: Diagnosis not present

## 2023-06-28 MED ORDER — TECHNETIUM TC 99M MEDRONATE IV KIT
20.0000 | PACK | Freq: Once | INTRAVENOUS | Status: AC | PRN
  Administered 2023-06-28: 20 via INTRAVENOUS

## 2023-06-28 NOTE — Telephone Encounter (Signed)
Patient is completely out of Symbicort.  [facility] [address]

## 2023-07-03 ENCOUNTER — Other Ambulatory Visit: Payer: Self-pay | Admitting: Pulmonary Disease

## 2023-07-03 ENCOUNTER — Telehealth: Payer: Self-pay | Admitting: Pulmonary Disease

## 2023-07-03 MED ORDER — FLUTICASONE FUROATE-VILANTEROL 100-25 MCG/ACT IN AEPB: 1.0000 | INHALATION_SPRAY | Freq: Every day | RESPIRATORY_TRACT | 5 refills | Status: DC

## 2023-07-03 MED ORDER — BUDESONIDE-FORMOTEROL FUMARATE 80-4.5 MCG/ACT IN AERO: 2.0000 | INHALATION_SPRAY | Freq: Two times a day (BID) | RESPIRATORY_TRACT | 5 refills | Status: DC | PRN

## 2023-07-03 NOTE — Telephone Encounter (Signed)
Rx sent to pharmacy. Patient aware.

## 2023-07-03 NOTE — Telephone Encounter (Signed)
See last signed encounter.  PT tastes she needs an alternative medicine called in right away.Ins will not cover. Pharm has sent in a request. [place] is leaving the [place] tomorrow and needs it today. Her # is [phone]   Called twice last week so this is not a last minute call in.

## 2023-07-03 NOTE — Telephone Encounter (Signed)
Per pharmacy insurance will not cover Symbicort at all. Preferred is Breo.   Per Dr. Sherene Sires (DOD) we can try Breyna 80. Spoke with pharmacy and Jerral Ralph is not covered either.   Per Dr. Sherene Sires ok to change to Breo 100. Rx sent to pharmacy.

## 2023-07-03 NOTE — Telephone Encounter (Signed)
PT calling back again. Here are two meds the ins co. will cover:  Cassidy Olsen

## 2023-07-07 DIAGNOSIS — N390 Urinary tract infection, site not specified: Secondary | ICD-10-CM | POA: Diagnosis not present

## 2023-07-07 DIAGNOSIS — I10 Essential (primary) hypertension: Secondary | ICD-10-CM | POA: Diagnosis not present

## 2023-07-12 ENCOUNTER — Other Ambulatory Visit: Payer: Self-pay | Admitting: Cardiovascular Disease

## 2023-07-17 DIAGNOSIS — M533 Sacrococcygeal disorders, not elsewhere classified: Secondary | ICD-10-CM | POA: Diagnosis not present

## 2023-07-24 DIAGNOSIS — M47812 Spondylosis without myelopathy or radiculopathy, cervical region: Secondary | ICD-10-CM | POA: Diagnosis not present

## 2023-08-16 ENCOUNTER — Ambulatory Visit: Payer: Medicare Other | Admitting: Neurology

## 2023-08-21 ENCOUNTER — Other Ambulatory Visit: Payer: Self-pay | Admitting: Family Medicine

## 2023-08-21 ENCOUNTER — Ambulatory Visit (INDEPENDENT_AMBULATORY_CARE_PROVIDER_SITE_OTHER): Payer: Medicare Other | Admitting: Gastroenterology

## 2023-08-21 ENCOUNTER — Encounter: Payer: Self-pay | Admitting: Gastroenterology

## 2023-08-21 ENCOUNTER — Telehealth: Payer: Self-pay | Admitting: Family Medicine

## 2023-08-21 VITALS — BP 150/105 | HR 88 | Ht 62.0 in | Wt 178.4 lb

## 2023-08-21 DIAGNOSIS — R140 Abdominal distension (gaseous): Secondary | ICD-10-CM

## 2023-08-21 DIAGNOSIS — M47812 Spondylosis without myelopathy or radiculopathy, cervical region: Secondary | ICD-10-CM | POA: Diagnosis not present

## 2023-08-21 DIAGNOSIS — M961 Postlaminectomy syndrome, not elsewhere classified: Secondary | ICD-10-CM | POA: Diagnosis not present

## 2023-08-21 DIAGNOSIS — K760 Fatty (change of) liver, not elsewhere classified: Secondary | ICD-10-CM | POA: Diagnosis not present

## 2023-08-21 DIAGNOSIS — K5909 Other constipation: Secondary | ICD-10-CM | POA: Diagnosis not present

## 2023-08-21 DIAGNOSIS — G894 Chronic pain syndrome: Secondary | ICD-10-CM | POA: Diagnosis not present

## 2023-08-21 DIAGNOSIS — N200 Calculus of kidney: Secondary | ICD-10-CM | POA: Diagnosis not present

## 2023-08-21 DIAGNOSIS — R109 Unspecified abdominal pain: Secondary | ICD-10-CM

## 2023-08-21 DIAGNOSIS — M5416 Radiculopathy, lumbar region: Secondary | ICD-10-CM | POA: Diagnosis not present

## 2023-08-21 DIAGNOSIS — R1011 Right upper quadrant pain: Secondary | ICD-10-CM

## 2023-08-21 DIAGNOSIS — K581 Irritable bowel syndrome with constipation: Secondary | ICD-10-CM

## 2023-08-21 DIAGNOSIS — M533 Sacrococcygeal disorders, not elsewhere classified: Secondary | ICD-10-CM | POA: Diagnosis not present

## 2023-08-21 MED ORDER — NALOXEGOL OXALATE 25 MG PO TABS: 25.0000 mg | ORAL_TABLET | Freq: Every day | ORAL | 3 refills | Status: DC

## 2023-08-21 NOTE — Telephone Encounter (Signed)
 Pt called to confirm appointment date and time with Dr Lucia Gaskins

## 2023-08-21 NOTE — Patient Instructions (Addendum)
 VISIT SUMMARY:  During today's visit, we discussed your ongoing issues with constipation, abdominal pain, fatty liver, and kidney stones. We reviewed your current medications and dietary modifications, and made some adjustments to help manage your symptoms more effectively.  We are sending in Movantik  25 mg. Start for 2 weeks using only 12.5 mg then increase to the 25 mg.   YOUR PLAN:  -CHRONIC CONSTIPATION: Chronic constipation means having infrequent or difficult bowel movements. Your constipation has worsened due to Tramadol . We will start you on Movantik  at bedtime to help with opioid-induced constipation and continue Linzess  in the morning. Use Dicyclomine  and Glycopyrrolate  sparingly for pain, as they can worsen constipation.  -FATTY LIVER: Fatty liver is a condition where fat builds up in the liver. Your liver enzymes are stable, and you have made good dietary changes. Continue with your current diet, daily exercise, and increase your water  intake.  -KIDNEY STONES: Kidney stones are hard deposits made of minerals and salts that form in your kidneys. You have small stones on the right side causing intermittent pain. Continue to drink plenty of water  to help pass the stones.  INSTRUCTIONS:  Please follow up in 6 months or sooner if there are any changes in your condition.  I appreciate the  opportunity to care for you  Thank You   Kavitha Nandigam , MD

## 2023-08-21 NOTE — Progress Notes (Signed)
 [name]    [id number]    11/05/1960  Primary Care Physician:Samal, Zada, MD  Referring Physician: Florie Zada, MD [address] [place],  [place] [address]   Chief complaint: Abdominal pain, constipation  Discussed the use of AI scribe software for clinical note transcription with the patient, who gave verbal consent to proceed.  History of Present Illness   The patient, Cassidy Olsen, 63 year old very pleasant female presents with a chief complaint of constipation, which has been exacerbated by the recent addition of Tramadol  to their pain management regimen. Prior to starting Tramadol , the patient was experiencing relief from constipation with Linzess , but now reports feeling as though they do not fully empty their bowels. They have attempted to manage this by taking Lubiprostone  at night and Linzess  in the morning, which has provided some relief. However, the patient still only experiences bowel movements once a day, and sometimes not at all, with instances of constipation.  The patient also reports abdominal pain, which they manage with Glycopyrrolate  and Dicyclomine  as needed. They have noticed that the pain is often triggered by greasy food, stress, and anxiety. The patient has also identified that Glycopyrrolate  causes dry mouth and decreases their motility, which exacerbates their constipation. They have been mindful to use these medications sparingly, especially considering their history of kidney stones.  The patient has been diagnosed with fatty liver, but their liver enzymes are currently stable. They have made significant dietary changes to manage this, including eliminating red meat, greasy food, and sugary items from their diet. They have also identified certain foods that exacerbate their gastrointestinal symptoms, such as fresh garlic and whole grain wheat bread, and have made modifications to their diet accordingly.  The patient has a history of  gallbladder surgery and continues to experience pain in the area where their gallbladder was located. They believe this pain may be due to scar tissue or bowel attachment in that area. The patient also has a history of kidney stones and continues to produce them. They manage this by drinking lots of water  to flush their kidneys.  The patient experienced a fall recently, which has caused some additional pain. They are also working on weight loss, which they believe will improve their overall health.       GI and other relevant Hx:   Echo 07-11-22 1.Left ventricular ejection fraction, by estimation, is 70 to 75%. The left ventricle has hyperdynamic function. The left ventricle has no regional wall motion abnormalities. There is mild concentric left ventricular hypertrophy. Left ventricular diastolic parameters are consistent with Grade I diastolic dysfunction (impaired relaxation).  2. Right ventricular systolic function is normal.  3.The right ventricular size is normal. The mitral valve is normal in structure. Trivial mitral valve regurgitation. No evidence of mitral stenosis.  4. The aortic valve is tricuspid. There is mild thickening of the aortic valve. Aortic valve regurgitation is not visualized. Aortic valve sclerosis is present, with no evidence of aortic valve stenosis.  5. The inferior vena cava is normal in size with greater than 50% respiratory variability, suggesting right atrial pressure of 3 mmHg.  US  abd 04/13/2023 1. Prior cholecystectomy. 2. Increased hepatic parenchymal echogenicity suggestive of steatosis.  CT renal  Colonoscopy 12/02/2022 - Non- bleeding internal hemorrhoids.  - The examination was otherwise normal.   Colonoscopy February 2018, small adenomatous colon polyp removed and medium-sized hemorrhoids   EGD February 2018 showed hiatal hernia and gastritis.  Biopsies negative for  H. pylori.  Duodenal biopsies negative for celiac.   Patient had work-up in [place] ,  HIDA scan and small bowel series insert negative for any significant pathology   Sister has celiac disease and her daughter has gluten sensitivity.  She had negative celiac testing, TTG IgA antibody was undetectable   S/p kidney stone removal 09/2019, has bilateral multiple small kidney stones was told it is residual fragments and she will eventually passed them     Abdominal ultrasound Jan 02, 2020: 1. Gallstones without sonographic evidence for acute cholecystitis or biliary dilatation 2. Bilateral kidney stones without hydronephrosis 3. Complicated cyst upper pole left kidney measuring 1.3 cm, could be more thoroughly characterized by MRI.   CT abdomen and pelvis with contrast December 2020 1. Obstructing 8 x 6 mm calculus at the left ureteropelvic junction resulting in mild-to-moderate left hydronephrosis with small volume perinephric fluid and stranding. Extensive left-sided urothelial enhancement and stranding raises suspicion for a superimposed infectious or inflammatory process. 2. Thickened appearance of the urinary bladder, particularly along its superior margin. Recommend correlation with urinalysis. 3. Multiple nonobstructing right renal calculi, largest measuring up to 9 mm. 4. Cholelithiasis without evidence for cholecystitis. 5. Previously seen left adnexal cyst has slightly decreased in size.     Outpatient Encounter Medications as of 08/21/2023  Medication Sig   ACETAMINOPHEN  PO Take 1,300 mg by mouth 2 (two) times daily.   albuterol  (VENTOLIN  HFA) 108 (90 Base) MCG/ACT inhaler Inhale 2 puffs into the lungs every 6 (six) hours as needed for wheezing or shortness of breath.   AMBULATORY NON FORMULARY MEDICATION 1 tablet 2 (two) times daily. Medication Name: Migrelief   AMITIZA  24 MCG capsule Take 24 mcg by mouth at bedtime.   Aspirin-Acetaminophen -Caffeine (MIGRAINE RELIEF PO) Take 1 tablet by mouth as needed.   atorvastatin  (LIPITOR) 20 MG tablet TAKE 1 TABLET BY  MOUTH EVERY DAY   botulinum toxin Type A  (BOTOX ) 200 units injection INJECT 155 UNITS INTRAMUSCULARLY EVERY 12 WEEKS (GIVEN AT MD  OFFICE, DISCARD UNUSED)   celecoxib  (CELEBREX ) 200 MG capsule Take 200 mg by mouth 2 (two) times daily.   cetirizine (ZYRTEC) 10 MG tablet Take 10 mg by mouth daily.   dexlansoprazole  (DEXILANT ) 60 MG capsule Take 1 capsule by mouth daily.   dicyclomine  (BENTYL ) 10 MG capsule Take 1 by mouth twice daily. (Patient taking differently: Take 20 mg by mouth in the morning and at bedtime. PRN)   fluticasone  (FLONASE ) 50 MCG/ACT nasal spray Place 2 sprays into both nostrils daily.   fluticasone  furoate-vilanterol (BREO ELLIPTA ) 100-25 MCG/ACT AEPB Inhale 1 puff into the lungs daily.   Fremanezumab -vfrm (AJOVY ) 225 MG/1.5ML SOAJ Inject 225 mg into the skin every 30 (thirty) days.   gabapentin  (NEURONTIN ) 300 MG capsule Take 3 capsules (900 mg total) by mouth 3 (three) times daily.   glycopyrrolate  (ROBINUL ) 2 MG tablet Take 1 tablet (2 mg total) by mouth 2 (two) times daily.   lidocaine  4 % Place 1 patch onto the skin as needed.   linaclotide  (LINZESS ) 290 MCG CAPS capsule Take 1 capsule (290 mcg total) by mouth daily before breakfast.   lisinopril (ZESTRIL) 10 MG tablet Take 10 mg by mouth daily.   montelukast  (SINGULAIR ) 10 MG tablet TAKE 1 TABLET BY MOUTH EVERYDAY AT BEDTIME   naloxegol  oxalate (MOVANTIK ) 25 MG TABS tablet Take 1 tablet (25 mg total) by mouth daily.   Omega-3 Fatty Acids (OMEGA III EPA+DHA) 1000 MG CAPS Take by mouth.   Spacer/Aero-Holding Chambers (AEROCHAMBER  MV) inhaler Use as instructed   traMADol  (ULTRAM ) 50 MG tablet Take 100 mg by mouth 3 (three) times daily. 2x daily   valACYclovir  (VALTREX ) 500 MG tablet Take 500 mg by mouth 2 (two) times daily as needed (outbreaks).    Vitamin D, Cholecalciferol, 25 MCG (1000 UT) CAPS Take 1 capsule by mouth daily.   [DISCONTINUED] amitriptyline  (ELAVIL ) 50 MG tablet Take 1 tablet (50 mg total) by mouth at  bedtime.   [DISCONTINUED] fluticasone -salmeterol (ADVAIR HFA) 45-21 MCG/ACT inhaler Inhale 2 puffs into the lungs 2 (two) times daily.   No facility-administered encounter medications on file as of 08/21/2023.    Allergies as of 08/21/2023 - Review Complete 08/21/2023  Allergen Reaction Noted   Aspirin Other (See Comments), Nausea And Vomiting, and Nausea Only 04/18/2013   Ketorolac  Hives 08/31/2022   Ketorolac  tromethamine  Shortness Of Breath, Itching, and Other (See Comments) 08/14/2019   Penicillins Hives, Itching, Rash, and Other (See Comments) 03/19/1994   Codeine Other (See Comments) 07/17/2018   Ibuprofen Other (See Comments) and Nausea And Vomiting 09/03/2016   Latex Rash and Other (See Comments) 03/07/2011   Omeprazole magnesium Other (See Comments) 12/25/2019   Pantoprazole  sodium Other (See Comments) 12/25/2019   Penicillamine Rash 12/26/2019   Pregabalin Other (See Comments) 12/25/2019   Phenazopyridine   08/21/2023   Pyridium  [phenazopyridine  hcl] Swelling 06/02/2022    Past Medical History:  Diagnosis Date   ADHD    Arthritis 2010   Asthma    Back pain    Chronic headaches    Chronic pain    Constipation    Edema of both lower extremities    Family history of breast cancer    Family history of prostate cancer    Gallstones 2018   GERD (gastroesophageal reflux disease)    High cholesterol    History of kidney stones    Hx: UTI (urinary tract infection)    Hypertension    IBS (irritable bowel syndrome)    Interstitial cystitis 2012   Joint pain    Kidney problem    Migraine    Occipital neuralgia    Osteoarthritis    PNA (pneumonia) 2018   Posttraumatic muscle contracture (HCC)    Pre-diabetes    Spinal stenosis    Trigeminal neuralgia     Past Surgical History:  Procedure Laterality Date   ABDOMINAL HYSTERECTOMY     ANKLE SURGERY Left 05/2009   APPENDECTOMY     Benign Tumor Removal Right 01/26/2017   right upper arm by Dr. Dale Melia at [facility] [place]    BRAIN SURGERY     CARPAL TUNNEL RELEASE Right 2014   CHOLECYSTECTOMY N/A 12/24/2020   Procedure: LAPAROSCOPIC CHOLECYSTECTOMY;  Surgeon: Vernetta Berg, MD;  Location: [facility];  Service: General;  Laterality: N/A;   COLONOSCOPY     CYSTOSCOPY W/ URETERAL STENT PLACEMENT Left 07/25/2019   Procedure: CYSTOSCOPY WITH RETROGRADE PYELOGRAM/URETERAL STENT PLACEMENT;  Surgeon: Matilda Senior, MD;  Location: [facility] ORS;  Service: Urology;  Laterality: Left;   CYSTOSCOPY W/ URETERAL STENT REMOVAL Left 08/15/2019   Procedure: CYSTOSCOPY WITH STENT REMOVAL;  Surgeon: Matilda Senior, MD;  Location: [facility];  Service: Urology;  Laterality: Left;   CYSTOSCOPY WITH RETROGRADE PYELOGRAM, URETEROSCOPY AND STENT PLACEMENT Left 08/15/2019   Procedure: CYSTOSCOPY WITH RETROGRADE PYELOGRAM, URETEROSCOPY;  Surgeon: Matilda Senior, MD;  Location: [facility];  Service: Urology;  Laterality: Left;  90 MINS   CYSTOSCOPY WITH RETROGRADE PYELOGRAM, URETEROSCOPY AND STENT PLACEMENT Right 09/12/2019   Procedure:  CYSTOSCOPY WITH RIGHT RETROGRADE PYELOGRAM  URETEROSCOPY WITH HOLMIUM LASER STONE EXTRACTION AND STENT PLACEMENT;  Surgeon: Matilda Senior, MD;  Location: [facility];  Service: Urology;  Laterality: Right;   DILATION AND CURETTAGE OF UTERUS     HOLMIUM LASER APPLICATION Left 08/15/2019   Procedure: HOLMIUM LASER APPLICATION;  Surgeon: Matilda Senior, MD;  Location: [facility];  Service: Urology;  Laterality: Left;   JOINT REPLACEMENT     R knee   KIDNEY STONE SURGERY     neuro spine similator  01/2021   OVARIAN CYST REMOVAL  05/2010   right knee revision  2016   ROTATOR CUFF REPAIR Right 02/2019   SPINE SURGERY     x 3   TUBAL LIGATION      Family History  Problem Relation Age of Onset   Ulcers Mother 60       peptic ulcer   Irritable bowel syndrome Mother    High blood pressure Mother    Sudden death Mother    Depression Mother    Prostate cancer Father 4   Heart disease  Father    Irritable bowel syndrome Father    High blood pressure Father    Breast cancer Sister 39       ER+/PR+/Her2- breast cancer   Alzheimer's disease Sister    Hypertension Sister    Kidney disease Sister    Celiac disease Sister    Thyroid  disease Sister    Breast cancer Paternal Grandmother    Diabetes Paternal Grandmother    Cancer Paternal Uncle        unknown cancers   Liver disease Neg Hx    Esophageal cancer Neg Hx    Colon cancer Neg Hx     Social History   Socioeconomic History   Marital status: Single    Spouse name: Not on file   Number of children: 1   Years of education: Not on file   Highest education level: Not on file  Occupational History   Occupation: retired   Occupation: consulting civil engineer  Tobacco Use   Smoking status: Never   Smokeless tobacco: Never  Vaping Use   Vaping status: Never Used  Substance and Sexual Activity   Alcohol use: Yes    Comment: socially   Drug use: No   Sexual activity: Yes    Birth control/protection: Surgical  Other Topics Concern   Not on file  Social History Narrative   Not on file   Social Drivers of Health   Financial Resource Strain: Low Risk  (04/12/2023)   Received from [facility]   Overall Financial Resource Strain (CARDIA)    Difficulty of Paying Living Expenses: Not very hard  Food Insecurity: Not on File (05/04/2023)   Received from [facility] Insecurity    Food: 0  Transportation Needs: No Transportation Needs (04/12/2023)   Received from [facility] - Transportation    Lack of Transportation (Medical): No    Lack of Transportation (Non-Medical): No  Physical Activity: Insufficiently Active (04/12/2023)   Received from [facility]   Exercise Vital Sign    Days of Exercise per Week: 2 days    Minutes of Exercise per Session: 20 min  Stress: No Stress Concern Present (04/12/2023)   Received from [facility] - Occupational Stress Questionnaire     Feeling of Stress : Only a little  Social Connections: Socially Integrated (04/12/2023)   Received from [facility]  Social Network    How would you rate your social network (family, work, friends)?: Good participation with social networks  Intimate Partner Violence: Not At Risk (04/12/2023)   Received from [facility]   HITS    Over the last 12 months how often did your partner physically hurt you?: Never    Over the last 12 months how often did your partner insult you or talk down to you?: Never    Over the last 12 months how often did your partner threaten you with physical harm?: Never    Over the last 12 months how often did your partner scream or curse at you?: Never      Review of systems: All other review of systems negative except as mentioned in the HPI.   Physical Exam: Vitals:   08/21/23 1107 08/21/23 1149  BP: (!) 154/98 (!) 150/105  Pulse: 88    Body mass index is 32.63 kg/m. Gen:      No acute distress HEENT:  sclera anicteric CV: s1s2 rrr, no murmur Lungs: B/l clear. Abd:      soft, non-tender; no palpable masses, no distension Ext:    No edema Neuro: alert and oriented x 3 Psych: normal mood and affect  Data Reviewed:  Reviewed labs, radiology imaging, old records and pertinent past GI work up     Assessment and Plan    Chronic Constipation Worsened by Tramadol  use. Currently on Linzess  290mcg daily  and Lubiprostone  24mcg at bedtime with partial relief. -Start Movantik  at bedtime to counteract opioid-induced constipation. Stop Lubiprostone  -Continue Linzess  290mcg daily in the morning. -Use Dicyclomine  and Glycopyrrolate  sparingly as needed for pain, mindful of their constipating side effects.  Fatty Liver Patient has made dietary modifications to reduce intake of red meat, greasy food, and sugary items. Liver enzymes are within normal limits. -Continue current dietary modifications. -Encourage daily exercise and increased water   intake.  Kidney Stones Small stones present on the right side. Patient experiences intermittent pain likely related to stone passage. -Encourage high fluid intake to promote stone passage.  Follow-up in 6 months or sooner if any changes occur.     The patient was provided an opportunity to ask questions and all were answered. The patient agreed with the plan and demonstrated an understanding of the instructions.  LOIS Wilkie Mcgee , MD    CC: Florie Cohen, MD

## 2023-08-22 ENCOUNTER — Other Ambulatory Visit: Payer: Self-pay

## 2023-08-22 MED ORDER — AMITRIPTYLINE HCL 50 MG PO TABS: 50.0000 mg | ORAL_TABLET | Freq: Every day | ORAL | 0 refills | Status: DC

## 2023-08-23 ENCOUNTER — Telehealth: Payer: Self-pay | Admitting: Pulmonary Disease

## 2023-08-23 ENCOUNTER — Ambulatory Visit: Payer: Medicare Other | Admitting: Neurology

## 2023-08-23 NOTE — Telephone Encounter (Signed)
 Patient needs refill of Montelukast . Has been rescheduled with new provider (patient of [facility])so will not be seen until March.   Pharmacy: [facility] [address]

## 2023-08-24 ENCOUNTER — Encounter: Payer: Self-pay | Admitting: Gastroenterology

## 2023-08-29 MED ORDER — MONTELUKAST SODIUM 10 MG PO TABS: 10.0000 mg | ORAL_TABLET | Freq: Every day | ORAL | 0 refills | Status: DC

## 2023-08-29 NOTE — Telephone Encounter (Signed)
Rx sent no refills until seen in office.

## 2023-08-31 ENCOUNTER — Ambulatory Visit: Payer: Medicare Other | Admitting: Pulmonary Disease

## 2023-09-07 ENCOUNTER — Other Ambulatory Visit (HOSPITAL_COMMUNITY): Payer: Self-pay

## 2023-09-07 ENCOUNTER — Telehealth: Payer: Self-pay | Admitting: Family Medicine

## 2023-09-07 DIAGNOSIS — N390 Urinary tract infection, site not specified: Secondary | ICD-10-CM | POA: Diagnosis not present

## 2023-09-07 DIAGNOSIS — G43709 Chronic migraine without aura, not intractable, without status migrainosus: Secondary | ICD-10-CM

## 2023-09-07 NOTE — Telephone Encounter (Signed)
Submitted PA request to pt's secondary [employer] per CMM, status is pending. Key: [id number]

## 2023-09-08 NOTE — Telephone Encounter (Signed)
Received approval, pt will continue to fill through [facility].  Auth#: [id number] (09/07/23-09/06/24)

## 2023-09-11 ENCOUNTER — Other Ambulatory Visit: Payer: Self-pay

## 2023-09-13 ENCOUNTER — Other Ambulatory Visit: Payer: Self-pay

## 2023-09-21 ENCOUNTER — Other Ambulatory Visit: Payer: Self-pay

## 2023-09-21 DIAGNOSIS — B349 Viral infection, unspecified: Secondary | ICD-10-CM | POA: Diagnosis not present

## 2023-09-21 DIAGNOSIS — J029 Acute pharyngitis, unspecified: Secondary | ICD-10-CM | POA: Diagnosis not present

## 2023-09-25 ENCOUNTER — Other Ambulatory Visit: Payer: Self-pay

## 2023-09-25 NOTE — Telephone Encounter (Signed)
 LVM asking pt to call [facility] @ [phone] for Botox consent.

## 2023-09-25 NOTE — Progress Notes (Signed)
 Specialty Pharmacy Refill Coordination Note  Cassidy Olsen is a 63 y.o. female contacted today regarding refills of specialty medication(s) OnabotulinumtoxinA (Botox)   Patient requested Courier to Provider Office   Delivery date: 09/26/23   Verified address: [facility] [address]   Medication will be filled on 09/25/23.

## 2023-09-26 ENCOUNTER — Encounter: Payer: Self-pay | Admitting: Family Medicine

## 2023-09-26 ENCOUNTER — Ambulatory Visit (INDEPENDENT_AMBULATORY_CARE_PROVIDER_SITE_OTHER): Payer: Medicare Other | Admitting: Family Medicine

## 2023-09-26 DIAGNOSIS — R102 Pelvic and perineal pain: Secondary | ICD-10-CM | POA: Diagnosis not present

## 2023-09-26 DIAGNOSIS — G43709 Chronic migraine without aura, not intractable, without status migrainosus: Secondary | ICD-10-CM | POA: Diagnosis not present

## 2023-09-26 MED ORDER — ONABOTULINUMTOXINA 200 UNITS IJ SOLR
155.0000 [IU] | Freq: Once | INTRAMUSCULAR | Status: AC
Start: 2023-09-26 — End: 2023-09-26
  Administered 2023-09-26: 155 [IU] via INTRAMUSCULAR

## 2023-09-26 NOTE — Progress Notes (Signed)
 Botox- 200 units x 1 vial Lot: [id number] Expiration: 11/2025 NDC: 7846-9629-52  Bacteriostatic 0.9% Sodium Chloride- 4 mL  Lot: [id number] Expiration: 06/2024 NDC: 4010-2725-36  Dx: U44.034 S/P  Witnessed by Marlis Edelson
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
## 2023-09-27 ENCOUNTER — Ambulatory Visit: Payer: Medicare Other | Admitting: Family Medicine

## 2023-10-02 DIAGNOSIS — M5417 Radiculopathy, lumbosacral region: Secondary | ICD-10-CM | POA: Diagnosis not present

## 2023-10-04 ENCOUNTER — Other Ambulatory Visit: Payer: Self-pay | Admitting: Nurse Practitioner

## 2023-10-11 DIAGNOSIS — J3489 Other specified disorders of nose and nasal sinuses: Secondary | ICD-10-CM | POA: Diagnosis not present

## 2023-10-11 DIAGNOSIS — J302 Other seasonal allergic rhinitis: Secondary | ICD-10-CM | POA: Diagnosis not present

## 2023-10-11 DIAGNOSIS — I10 Essential (primary) hypertension: Secondary | ICD-10-CM | POA: Diagnosis not present

## 2023-10-16 ENCOUNTER — Other Ambulatory Visit: Payer: Self-pay

## 2023-10-16 ENCOUNTER — Emergency Department (HOSPITAL_COMMUNITY)

## 2023-10-16 ENCOUNTER — Encounter (HOSPITAL_COMMUNITY): Payer: Self-pay

## 2023-10-16 ENCOUNTER — Emergency Department (HOSPITAL_COMMUNITY)
Admission: EM | Admit: 2023-10-16 | Discharge: 2023-10-16 | Disposition: A | Discharge status: Home / Self Care | Attending: Emergency Medicine | Admitting: Emergency Medicine

## 2023-10-16 DIAGNOSIS — I10 Essential (primary) hypertension: Secondary | ICD-10-CM | POA: Diagnosis not present

## 2023-10-16 DIAGNOSIS — Z79899 Other long term (current) drug therapy: Secondary | ICD-10-CM | POA: Insufficient documentation

## 2023-10-16 DIAGNOSIS — Z5329 Procedure and treatment not carried out because of patient's decision for other reasons: Secondary | ICD-10-CM | POA: Insufficient documentation

## 2023-10-16 DIAGNOSIS — Z91040 Latex allergy status: Secondary | ICD-10-CM | POA: Insufficient documentation

## 2023-10-16 DIAGNOSIS — R0789 Other chest pain: Secondary | ICD-10-CM | POA: Diagnosis not present

## 2023-10-16 DIAGNOSIS — I161 Hypertensive emergency: Secondary | ICD-10-CM | POA: Diagnosis not present

## 2023-10-16 DIAGNOSIS — J45909 Unspecified asthma, uncomplicated: Secondary | ICD-10-CM | POA: Diagnosis not present

## 2023-10-16 DIAGNOSIS — Z7982 Long term (current) use of aspirin: Secondary | ICD-10-CM | POA: Insufficient documentation

## 2023-10-16 DIAGNOSIS — R079 Chest pain, unspecified: Secondary | ICD-10-CM | POA: Diagnosis not present

## 2023-10-16 DIAGNOSIS — Z5321 Procedure and treatment not carried out due to patient leaving prior to being seen by health care provider: Secondary | ICD-10-CM

## 2023-10-16 LAB — LIPASE, BLOOD: Lipase: 40 U/L (ref 11–51)

## 2023-10-16 LAB — COMPREHENSIVE METABOLIC PANEL
ALT: 36 U/L (ref 0–44)
AST: 42 U/L — ABNORMAL HIGH (ref 15–41)
Albumin: 3.5 g/dL (ref 3.5–5.0)
Alkaline Phosphatase: 46 U/L (ref 38–126)
Anion gap: 13 (ref 5–15)
BUN: 15 mg/dL (ref 8–23)
CO2: 25 mmol/L (ref 22–32)
Calcium: 9.4 mg/dL (ref 8.9–10.3)
Chloride: 100 mmol/L (ref 98–111)
Creatinine, Ser: 0.81 mg/dL (ref 0.44–1.00)
GFR, Estimated: >60 mL/min (ref >60)
Glucose, Bld: 127 mg/dL — ABNORMAL HIGH (ref 70–99)
Potassium: 4.1 mmol/L (ref 3.5–5.1)
Sodium: 138 mmol/L (ref 135–145)
Total Bilirubin: 0.7 mg/dL (ref 0.0–1.2)
Total Protein: 6.8 g/dL (ref 6.5–8.1)

## 2023-10-16 LAB — CBC
HCT: 42 % (ref 36.0–46.0)
Hemoglobin: 14 g/dL (ref 12.0–15.0)
MCH: 32.6 pg (ref 26.0–34.0)
MCHC: 33.3 g/dL (ref 30.0–36.0)
MCV: 97.7 fL (ref 80.0–100.0)
Platelets: 340 10*3/uL (ref 150–400)
RBC: 4.3 MIL/uL (ref 3.87–5.11)
RDW: 13 % (ref 11.5–15.5)
WBC: 11.1 10*3/uL — ABNORMAL HIGH (ref 4.0–10.5)
nRBC: 0 % (ref 0.0–0.2)

## 2023-10-16 LAB — TROPONIN I (HIGH SENSITIVITY): Troponin I (High Sensitivity): <2 ng/L (ref <18)

## 2023-10-16 MED ORDER — ACETAMINOPHEN 500 MG PO TABS
1000.0000 mg | ORAL_TABLET | Freq: Once | ORAL | Status: DC
  Filled 2023-10-16: qty 2

## 2023-10-16 NOTE — ED Provider Triage Note (Signed)
 Emergency Medicine Provider Triage Evaluation Note  Cassidy Olsen , a 63 y.o. female  was evaluated in triage.  Pt complains of central chest pain that felt like a gas pain.  Patient reports that she was having chest discomfort with some radiation to her back this morning.  She was on the way to her doctor's office to get evaluated for chest pain.  In the office she had hypertension up to 184/110.  They were concerned with the pain and called EMS for transport.  Patient was given nitroglycerin by EMS.  Blood pressure has been improving.  Patient reports that she has been on a course of prednisone recently and suspects this is what is increased her blood pressure recently.  Patient reports compliance with her medications.  Patient reports that she had an episode of chest pain somewhat over a year ago and got a fairly extensive workup including coronary artery calcium score which was 0.  Review of Systems  Positive: Chest pain Negative: Nausea vomiting  Physical Exam  BP (!) 152/100   Pulse 87   Temp 98.2 F (36.8 C)   Resp 18   Ht 5\' 2"  (1.575 m)   Wt 81.6 kg   SpO2 100%   BMI 32.92 kg/m  Gen:   Awake, no distress well appearance Resp:  Normal effort clear to auscultation Cardiac: Regular no rub murmur gallop MSK:   Moves extremities without difficulty no lower extremity edema or calf pain  Medical Decision Making  Medically screening exam initiated at 2:26 PM.  Appropriate orders placed.  Cassidy Olsen was informed that the remainder of the evaluation will be completed by another provider, this initial triage assessment does not replace that evaluation, and the importance of remaining in the ED until their evaluation is complete.  I have personally reviewed the patient's EKG.  Normal appearance with no ischemic changes.   Arby Barrette, MD 10/16/23 1430

## 2023-10-16 NOTE — ED Provider Notes (Signed)
 [place] [facility] EMERGENCY DEPARTMENT AT [facility] Provider Note   CSN: [id number] Arrival date & time: 10/16/23  1337     History  Chief Complaint  Patient presents with   Chest Pain    Cassidy Olsen is a 63 y.o. female with PMH as listed below who presents with central chest pain that felt like a gas pain.  Patient reports that she was having chest discomfort with some radiation to her back this morning.  She was on the way to her doctor's office to get evaluated for chest pain.  In the office she had hypertension up to 184/110.  They were concerned with the pain and called EMS for transport.  Patient was given nitroglycerin by EMS.  Blood pressure has been improving.  Patient reports that she has been on a course of prednisone recently and suspects this is what is increased her blood pressure recently.  Patient reports compliance with her medications.  Patient reports that she had an episode of chest pain somewhat over a year ago and got a fairly extensive workup including coronary artery calcium score which was 0, and it was ultimately thought to be due to costochondritis. She states this feels similar. She denies nausea/vomiting, diaphoresis, flu-like sxs, cough, SOB, leg swelling, h/o DVT/PE.    Past Medical History:  Diagnosis Date   ADHD    Arthritis 2010   Asthma    Back pain    Chronic headaches    Chronic pain    Constipation    Edema of both lower extremities    Family history of breast cancer    Family history of prostate cancer    Gallstones 2018   GERD (gastroesophageal reflux disease)    High cholesterol    History of kidney stones    Hx: UTI (urinary tract infection)    Hypertension    IBS (irritable bowel syndrome)    Interstitial cystitis 2012   Joint pain    Kidney problem    Migraine    Occipital neuralgia    Osteoarthritis    PNA (pneumonia) 2018   Posttraumatic muscle contracture (HCC)    Pre-diabetes    Spinal stenosis    Trigeminal  neuralgia        Home Medications Prior to Admission medications   Medication Sig Start Date End Date Taking? Authorizing Provider  ACETAMINOPHEN PO Take 1,300 mg by mouth 2 (two) times daily.    [provider]  albuterol (VENTOLIN HFA) 108 (90 Base) MCG/ACT inhaler Inhale 2 puffs into the lungs every 6 (six) hours as needed for wheezing or shortness of breath. 06/02/22   Icard, Rachel Bo, DO  AMBULATORY NON FORMULARY MEDICATION 1 tablet 2 (two) times daily. Medication Name: Migrelief    [provider]  amitriptyline (ELAVIL) 50 MG tablet Take 1 tablet (50 mg total) by mouth at bedtime. 08/22/23   Lomax, Amy, NP  Aspirin-Acetaminophen-Caffeine (MIGRAINE RELIEF PO) Take 1 tablet by mouth as needed.    [provider]  atorvastatin (LIPITOR) 20 MG tablet TAKE 1 TABLET BY MOUTH EVERY DAY 07/12/23   Runell Gess, MD  botulinum toxin Type A (BOTOX) 200 units injection INJECT 155 UNITS INTRAMUSCULARLY EVERY 12 WEEKS (GIVEN AT MD  OFFICE, DISCARD UNUSED) 06/19/23   Lomax, Amy, NP  celecoxib (CELEBREX) 200 MG capsule Take 200 mg by mouth 2 (two) times daily.    [provider]  cetirizine (ZYRTEC) 10 MG tablet Take 10 mg by mouth daily.  [provider]  dexlansoprazole (DEXILANT) 60 MG capsule Take 1 capsule by mouth daily. 09/19/22   [provider]  dicyclomine (BENTYL) 10 MG capsule Take 1 by mouth twice daily. Patient taking differently: Take 20 mg by mouth in the morning and at bedtime. PRN 08/31/22   Unk Lightning, PA  fluticasone (FLONASE) 50 MCG/ACT nasal spray Place 2 sprays into both nostrils daily. 06/08/20   Icard, Rachel Bo, DO  fluticasone furoate-vilanterol (BREO ELLIPTA) 100-25 MCG/ACT AEPB Inhale 1 puff into the lungs daily. 07/03/23   Nyoka Cowden, MD  Fremanezumab-vfrm (AJOVY) 225 MG/1.5ML SOAJ Inject 225 mg into the skin every 30 (thirty) days. 06/14/23   Lomax, Amy, NP  gabapentin (NEURONTIN) 300 MG capsule Take  3 capsules (900 mg total) by mouth 3 (three) times daily. 05/24/22   Lomax, Amy, NP  glycopyrrolate (ROBINUL) 2 MG tablet Take 1 tablet (2 mg total) by mouth 2 (two) times daily. 04/19/23   Meredith Pel, NP  lidocaine 4 % Place 1 patch onto the skin as needed.    [provider]  linaCLOtide (LINZESS PO) Take by mouth.    [provider]  LINZESS 290 MCG CAPS capsule TAKE 1 CAPSULE BY MOUTH DAILY BEFORE BREAKFAST. 10/04/23   Nandigam, Eleonore Chiquito, MD  lisinopril (ZESTRIL) 10 MG tablet Take 10 mg by mouth daily.    [provider]  montelukast (SINGULAIR) 10 MG tablet Take 1 tablet (10 mg total) by mouth at bedtime. 08/29/23   Cobb, Ruby Cola, NP  naloxegol oxalate (MOVANTIK) 25 MG TABS tablet Take 1 tablet (25 mg total) by mouth daily. 08/21/23   Napoleon Form, MD  Omega-3 Fatty Acids (OMEGA III EPA+DHA) 1000 MG CAPS Take by mouth. 09/22/22   [provider]  Spacer/Aero-Holding Chambers (AEROCHAMBER MV) inhaler Use as instructed 04/18/18   Icard, Rachel Bo, DO  traMADol (ULTRAM) 50 MG tablet Take 100 mg by mouth 3 (three) times daily. 2x daily    [provider]  valACYclovir (VALTREX) 500 MG tablet Take 500 mg by mouth 2 (two) times daily as needed (outbreaks).     [provider]  Vitamin D, Cholecalciferol, 25 MCG (1000 UT) CAPS Take 1 capsule by mouth daily.    [provider]      Allergies    Aspirin, Ketorolac, Ketorolac tromethamine, Penicillins, Codeine, Ibuprofen, Latex, Omeprazole magnesium, Pantoprazole sodium, Penicillamine, Pregabalin, Phenazopyridine, and Pyridium [phenazopyridine hcl]    Review of Systems   Review of Systems A 10 point review of systems was performed and is negative unless otherwise reported in HPI.  Physical Exam Updated Vital Signs BP (!) 175/113   Pulse 89   Temp 99.5 F (37.5 C)   Resp 16   Ht 5\' 2"  (1.575 m)   Wt 81.6 kg   SpO2 100%   BMI 32.92 kg/m  Physical Exam General:  Normal appearing female, lying in bed.  HEENT: Sclera anicteric, MMM, trachea midline.  Cardiology: RRR, no murmurs/rubs/gallops. Mild anterior left parasternal chest wall TTP. No gross deformities or signs of trauma. Resp: Normal respiratory rate and effort. CTAB, no wheezes, rhonchi, crackles.  Abd: Soft, non-tender, non-distended. No rebound tenderness or guarding.  GU: Deferred. MSK: No peripheral edema or signs of trauma.  Skin: warm, dry.  Neuro: A&Ox4, CNs II-XII grossly intact. MAEs. Sensation grossly intact.  Psych: Normal mood and affect.   ED Results / Procedures / Treatments   Labs (all labs ordered are listed, but only abnormal  results are displayed) Labs Reviewed  COMPREHENSIVE METABOLIC PANEL - Abnormal; Notable for the following components:      Result Value   Glucose, Bld 127 (*)    AST 42 (*)    All other components within normal limits  CBC - Abnormal; Notable for the following components:   WBC 11.1 (*)    All other components within normal limits  LIPASE, BLOOD  TROPONIN I (HIGH SENSITIVITY)  TROPONIN I (HIGH SENSITIVITY)    EKG EKG Interpretation Date/Time:  Monday October 16 2023 14:07:28 EDT Ventricular Rate:  84 PR Interval:  136 QRS Duration:  72 QT Interval:  348 QTC Calculation: 411 R Axis:   1  Text Interpretation: Normal sinus rhythm Cannot rule out Anterior infarct , age undetermined Confirmed by Vivi Barrack [phone]) on 10/16/2023 5:02:38 PM  Radiology DG Chest 2 View Result Date: 10/16/2023 CLINICAL DATA:  Chest pain EXAM: CHEST - 2 VIEW COMPARISON:  07/27/2022 FINDINGS: Ascending thoracic stimulator leads. Linear atelectasis or scarring at the lower lungs. Normal cardiac size. No focal airspace disease, pleural effusion or pneumothorax IMPRESSION: No active cardiopulmonary disease. Electronically Signed   By: Jasmine Pang M.D.   On: 10/16/2023 17:57    Procedures Procedures    Medications Ordered in ED Medications  acetaminophen  (TYLENOL) tablet 1,000 mg (1,000 mg Oral Patient Refused/Not Given 10/16/23 1758)    ED Course/ Medical Decision Making/ A&P                          Medical Decision Making Risk OTC drugs.    This patient presents to the ED for concern of CP, this involves an extensive number of treatment options, and is a complaint that carries with it a high risk of complications and morbidity.  I considered the following differential and admission for this acute, potentially life threatening condition.   MDM:    DDX for chest pain includes but is not limited to:  Consider costochondritis or most likely MSK chest wall pain given known history and chest wall TTP.  EKG doesn' show signs of acute ischemia and will obtain troponin. Her pain started yesterday so can get single troponin. Patient is requesting to leave ED but discussed with her about importance of troponin which is still pending. EKG also doesn't show signs of pericarditis/myocarditis. She cannot PERC out d/t age but has no risk factors for PE and no signs/sxs of DVT. Did consider dissection given report that her pain radiated to the back, but she has no widened mediastinum on CXR and no pulse deficit, no N/T. No abdominal pain and no c/f biliary disease.    Prior to troponin returning, which did reassuringly result negative, patient eloped from the ED.    Labs: I Ordered, and personally interpreted labs.  The pertinent results include:  those listed above  Imaging Studies ordered: CXR was ordered by offgoing EDP I independently visualized and interpreted imaging. I agree with the radiologist interpretation  Additional history obtained from chart review.    Reevaluation: After the interventions noted above, I reevaluated the patient and found that they have :stayed the same  Social Determinants of Health: Lives independently  Disposition:  Eloped from ED  Co morbidities that complicate the patient evaluation  Past Medical  History:  Diagnosis Date   ADHD    Arthritis 2010   Asthma    Back pain    Chronic headaches    Chronic pain  Constipation    Edema of both lower extremities    Family history of breast cancer    Family history of prostate cancer    Gallstones 2018   GERD (gastroesophageal reflux disease)    High cholesterol    History of kidney stones    Hx: UTI (urinary tract infection)    Hypertension    IBS (irritable bowel syndrome)    Interstitial cystitis 2012   Joint pain    Kidney problem    Migraine    Occipital neuralgia    Osteoarthritis    PNA (pneumonia) 2018   Posttraumatic muscle contracture (HCC)    Pre-diabetes    Spinal stenosis    Trigeminal neuralgia      Medicines Meds ordered this encounter  Medications   acetaminophen (TYLENOL) tablet 1,000 mg    I have reviewed the patients home medicines and have made adjustments as needed  Problem List / ED Course: Problem List Items Addressed This Visit   None Visit Diagnoses       Chest pain, unspecified type    -  Primary     Eloped from emergency department                       This note was created using dictation software, which may contain spelling or grammatical errors.    Loetta Rough, MD 10/16/23 [phone]

## 2023-10-16 NOTE — ED Triage Notes (Signed)
 Patient BIB [facility] from drs office for chest pain starting on her way to the drs office which she reports is pressure across her chest and into her back. Patient went to drs office for hypertension, reported to be 184/110, 168/110 with EMS. Patient reports she took her meds this morning and no relief.

## 2023-10-16 NOTE — ED Notes (Signed)
 Call placed to lab to add troponin onto previously collected lab work.

## 2023-10-19 DIAGNOSIS — R070 Pain in throat: Secondary | ICD-10-CM | POA: Diagnosis not present

## 2023-10-19 DIAGNOSIS — R0982 Postnasal drip: Secondary | ICD-10-CM | POA: Diagnosis not present

## 2023-10-19 DIAGNOSIS — J309 Allergic rhinitis, unspecified: Secondary | ICD-10-CM | POA: Diagnosis not present

## 2023-10-19 DIAGNOSIS — K219 Gastro-esophageal reflux disease without esophagitis: Secondary | ICD-10-CM | POA: Diagnosis not present

## 2023-10-24 ENCOUNTER — Ambulatory Visit: Payer: Medicare Other | Admitting: Pulmonary Disease

## 2023-10-24 ENCOUNTER — Encounter: Payer: Self-pay | Admitting: Pulmonary Disease

## 2023-10-24 VITALS — BP 134/82 | HR 107 | Ht 62.5 in | Wt 185.4 lb

## 2023-10-24 DIAGNOSIS — B9689 Other specified bacterial agents as the cause of diseases classified elsewhere: Secondary | ICD-10-CM | POA: Diagnosis not present

## 2023-10-24 DIAGNOSIS — J4520 Mild intermittent asthma, uncomplicated: Secondary | ICD-10-CM

## 2023-10-24 DIAGNOSIS — J019 Acute sinusitis, unspecified: Secondary | ICD-10-CM

## 2023-10-24 DIAGNOSIS — J4540 Moderate persistent asthma, uncomplicated: Secondary | ICD-10-CM | POA: Diagnosis not present

## 2023-10-24 MED ORDER — MOXIFLOXACIN HCL 400 MG PO TABS: 400.0000 mg | ORAL_TABLET | Freq: Every day | ORAL | 0 refills | Status: DC

## 2023-10-24 NOTE — Patient Instructions (Signed)
 It is nice to meet you  Continue Breo  Continue nasal sprays you are on  I would add sinus rinse twice a day for 1 to 2 weeks then decrease to once a day if improving  I prescribed moxifloxacin, 1 tablet once a day for 10 days to treat possible sinus infection given the discolored sputum you cough up that is likely dripping from the sinuses via the back of your throat.  Return to clinic in 3 months or sooner as needed with Dr. Judeth Horn

## 2023-10-24 NOTE — Progress Notes (Signed)
 @Patient  ID: Cassidy Olsen, female    DOB: 1961/07/22, 63 y.o.   MRN: [id number]  Chief Complaint  Patient presents with   Follow-up    Pt states having a some seasonal sinus issues was sick back in 02/25 since then still having issues coughing up yellow sputum at night    Referring provider: Woodfin Ganja, MD  HPI:   63 y.o. history of asthma whom we are seeing for the same.  Multiple pulmonary notes from Dr. Tonia Brooms reviewed.  Historically well-controlled asthma.  Was only using Breo as needed.  Last seen 05/2022.  Got sick a few weeks ago.  Sounds like viral illness.  Multiple household members similar illness.  Had a lot of congestion wheeze.  This is slowly getting better.  Now has more sinus congestion.  Postnasal drip.  Sinus pressure etc.  Using Flonase.  Seen by ENT last week.  Added ipratropium nasal spray.  No real improvement.  We discussed strategies to help with this.  Continuing Breo.  Was using as needed.  Using daily now for about the last week or so.  Discussed need for possibly escalating inhaler therapies in the future.  Questionaires / Pulmonary Flowsheets:   ACT:  Asthma Control Test ACT Total Score  06/08/2020  3:15 PM 23    MMRC:     No data to display          Epworth:      No data to display          Tests:   FENO:  No results found for: "NITRICOXIDE"  PFT:    Latest Ref Rng & Units 05/03/2019   12:03 PM  PFT Results  FVC-Pre L 3.14   FVC-Predicted Pre % 119   FVC-Post L 3.14   FVC-Predicted Post % 119   Pre FEV1/FVC % % 76   Post FEV1/FCV % % 79   FEV1-Pre L 2.38   FEV1-Predicted Pre % 114   FEV1-Post L 2.49   DLCO uncorrected ml/min/mmHg 21.67   DLCO UNC% % 109   DLVA Predicted % 103   TLC L 6.05   TLC % Predicted % 123   RV % Predicted % 159   Spirometry normal no bronchodilator response lung practices with hyperinflation and air trapping, DLCO within normal limits on my review and interpretation  WALK:      No data  to display          Imaging: Personally reviewed and as per EMR and discussion of this note DG Chest 2 View Result Date: 10/16/2023 CLINICAL DATA:  Chest pain EXAM: CHEST - 2 VIEW COMPARISON:  07/27/2022 FINDINGS: Ascending thoracic stimulator leads. Linear atelectasis or scarring at the lower lungs. Normal cardiac size. No focal airspace disease, pleural effusion or pneumothorax IMPRESSION: No active cardiopulmonary disease. Electronically Signed   By: Jasmine Pang M.D.   On: 10/16/2023 17:57    Lab Results: Personally reviewed CBC    Component Value Date/Time   WBC 11.1 (H) 10/16/2023 1437   RBC 4.30 10/16/2023 1437   HGB 14.0 10/16/2023 1437   HGB 13.5 04/26/2022 1050   HCT 42.0 10/16/2023 1437   HCT 40.4 04/26/2022 1050   PLT 340 10/16/2023 1437   PLT 306 04/26/2022 1050   MCV 97.7 10/16/2023 1437   MCV 94 04/26/2022 1050   MCH 32.6 10/16/2023 1437   MCHC 33.3 10/16/2023 1437   RDW 13.0 10/16/2023 1437   RDW 14.3 04/26/2022 1050   LYMPHSABS 3.1  03/31/2023 2057   LYMPHSABS 2.2 04/26/2022 1050   MONOABS 0.6 03/31/2023 2057   EOSABS 0.2 03/31/2023 2057   EOSABS 0.2 04/26/2022 1050   BASOSABS 0.1 03/31/2023 2057   BASOSABS 0.1 04/26/2022 1050    BMET    Component Value Date/Time   NA 138 10/16/2023 1437   NA 138 11/09/2022 1222   K 4.1 10/16/2023 1437   CL 100 10/16/2023 1437   CO2 25 10/16/2023 1437   GLUCOSE 127 (H) 10/16/2023 1437   BUN 15 10/16/2023 1437   BUN 12 11/09/2022 1222   CREATININE 0.81 10/16/2023 1437   CALCIUM 9.4 10/16/2023 1437   GFRNONAA >60 10/16/2023 1437   GFRAA 59 (L) 12/09/2019 1616    BNP No results found for: "BNP"  ProBNP No results found for: "PROBNP"  Specialty Problems       Pulmonary Problems   Asthma   Hx asthma in her 40's while on acei Complains of wheezing and cough 04/18/18 IgE 47, neg rast on  singulair   07/27/18 FENO 13  - PFT's  05/03/2019   FEV1 2.49 (120 % ) ratio 0.79  p 4 % improvement from saba p  nothing  prior to study with DLCO  21.67 (109%) corrects to 4.42 (103%)  for alv volume and FV curve minimal concavity and ERV 53%     Formatting of this note might be different from the original. Asthma      Acute bacterial sinusitis   Upper airway cough syndrome   Onset of resp problems all started in pt's 40's while on ACEi > d/c around 2014  - Allergy profile No visit date found. >  Eos 211  /  IgE  47  RAST neg  - 11/12/2018 max rx for gerd/ cyclical cough  - Sinus CT 12/20/18 c/w rhinitis/ no sinusitis       Chronic asthmatic bronchitis (HCC)   Allergic rhinitis    Allergies  Allergen Reactions   Aspirin Other (See Comments), Nausea And Vomiting and Nausea Only    Stomach upset Avoids due to IBS Due to ibs Stomach upset Due to ibs Avoids due to IBS   Ketorolac Hives   Ketorolac Tromethamine Shortness Of Breath, Itching and Other (See Comments)    IM administration ONLY  REDNESS IM administration ONLY  REDNESS  SWELLING OF THE THROAT   ORAL TORADOL CAUSED EXTREME REDNESS, ITCHINESS, DIFFICULTY BREATHING, AND FACIAL SWELLING. PATIENT ALSO DIAGNOSED WITH BELL'S PALSY.   Penicillins Hives, Itching, Rash and Other (See Comments)    Did it involve swelling of the face/tongue/throat, SOB, or low BP? No Did it involve sudden or severe rash/hives, skin peeling, or any reaction on the inside of your mouth or nose? Yes Did you need to seek medical attention at a hospital or doctor's office? No When did it last happen?      2000 If all above answers are "NO", may proceed with cephalosporin use.  Vaginal rash Vaginal rash Did it involve swelling of the face/tongue/throat, SOB, or low BP? No Did it involve sudden or severe rash/hives, skin peeling, or any reaction on the inside of your mouth or nose? Yes Did you need to seek medical attention at a hospital or doctor's office? No When did it last happen?      2000 If all above answers are "NO", may proceed with cephalosporin  use. Vaginal rash Vaginal rash   Codeine Other (See Comments)    Cough meds with codeine-have withdraw symptoms when done  Cough meds with codeine-have withdraw symptoms when done Cough meds with codeine-have withdraw symptoms when done   Ibuprofen Other (See Comments) and Nausea And Vomiting    Stomach upset Avoids due to IBS Stomach upset Avoids due to IBS   Latex Rash and Other (See Comments)    Vaginal irritation Vaginal irritation Exacerbates genital herpes Exacerbates genital herpes   Omeprazole Magnesium Other (See Comments)    Lack of therapeutic effect Lack of therapeutic effect   Pantoprazole Sodium Other (See Comments)    Lack of therapeutic effect Lack of therapeutic effect   Penicillamine Rash   Pregabalin Other (See Comments)    Weight gain Weight gain   Phenazopyridine     Other Reaction(s): angioedema   Pyridium [Phenazopyridine Hcl] Swelling    Lip swelling and burning    Immunization History  Administered Date(s) Administered   Influenza Inj Mdck Quad Pf 05/24/2017, 05/09/2018, 06/17/2022   Influenza Split 05/24/2017, 05/09/2018   Influenza,inj,Quad PF,6+ Mos 05/10/2018, 04/27/2019   Influenza,inj,quad, With Preservative 08/08/2016   Moderna Sars-Covid-2 Vaccination 09/30/2019, 10/28/2019   PFIZER Comirnaty(Gray Top)Covid-19 Tri-Sucrose Vaccine 01/27/2021   PFIZER(Purple Top)SARS-COV-2 Vaccination 10/12/2019, 11/02/2019, 06/24/2020   PNEUMOCOCCAL CONJUGATE-20 12/27/2021   Pfizer(Comirnaty)Fall Seasonal Vaccine 12 years and older 06/17/2022, 06/28/2023   Pneumococcal Conjugate-13 05/23/2017   Tdap 01/11/2017   Unspecified SARS-COV-2 Vaccination 01/27/2021   Zoster Recombinant(Shingrix) 02/16/2021, 10/12/2021    Past Medical History:  Diagnosis Date   ADHD    Arthritis 2010   Asthma    Back pain    Chronic headaches    Chronic pain    Constipation    Edema of both lower extremities    Family history of breast cancer    Family history of  prostate cancer    Gallstones 2018   GERD (gastroesophageal reflux disease)    High cholesterol    History of kidney stones    Hx: UTI (urinary tract infection)    Hypertension    IBS (irritable bowel syndrome)    Interstitial cystitis 2012   Joint pain    Kidney problem    Migraine    Occipital neuralgia    Osteoarthritis    PNA (pneumonia) 2018   Posttraumatic muscle contracture (HCC)    Pre-diabetes    Spinal stenosis    Trigeminal neuralgia     Tobacco History: Social History   Tobacco Use  Smoking Status Never  Smokeless Tobacco Never   Counseling given: Not Answered   Continue to not smoke  Outpatient Encounter Medications as of 10/24/2023  Medication Sig   ACETAMINOPHEN PO Take 1,300 mg by mouth 2 (two) times daily.   albuterol (VENTOLIN HFA) 108 (90 Base) MCG/ACT inhaler Inhale 2 puffs into the lungs every 6 (six) hours as needed for wheezing or shortness of breath.   AMBULATORY NON FORMULARY MEDICATION 1 tablet 2 (two) times daily. Medication Name: Migrelief   amitriptyline (ELAVIL) 50 MG tablet Take 1 tablet (50 mg total) by mouth at bedtime.   atorvastatin (LIPITOR) 20 MG tablet TAKE 1 TABLET BY MOUTH EVERY DAY   botulinum toxin Type A (BOTOX) 200 units injection INJECT 155 UNITS INTRAMUSCULARLY EVERY 12 WEEKS (GIVEN AT MD  OFFICE, DISCARD UNUSED)   celecoxib (CELEBREX) 200 MG capsule Take 200 mg by mouth 2 (two) times daily.   cetirizine (ZYRTEC) 10 MG tablet Take 10 mg by mouth daily.   dexlansoprazole (DEXILANT) 60 MG capsule Take 1 capsule by mouth daily.   dicyclomine (BENTYL) 10 MG capsule Take 1  by mouth twice daily. (Patient taking differently: Take 20 mg by mouth in the morning and at bedtime. PRN)   fluticasone (FLONASE) 50 MCG/ACT nasal spray Place 2 sprays into both nostrils daily.   fluticasone furoate-vilanterol (BREO ELLIPTA) 100-25 MCG/ACT AEPB Inhale 1 puff into the lungs daily.   Fremanezumab-vfrm (AJOVY) 225 MG/1.5ML SOAJ Inject 225 mg into  the skin every 30 (thirty) days.   gabapentin (NEURONTIN) 300 MG capsule Take 3 capsules (900 mg total) by mouth 3 (three) times daily.   glycopyrrolate (ROBINUL) 2 MG tablet Take 1 tablet (2 mg total) by mouth 2 (two) times daily.   lidocaine 4 % Place 1 patch onto the skin as needed.   linaCLOtide (LINZESS PO) Take by mouth.   LINZESS 290 MCG CAPS capsule TAKE 1 CAPSULE BY MOUTH DAILY BEFORE BREAKFAST.   lisinopril (ZESTRIL) 10 MG tablet Take 10 mg by mouth daily.   montelukast (SINGULAIR) 10 MG tablet Take 1 tablet (10 mg total) by mouth at bedtime.   moxifloxacin (AVELOX) 400 MG tablet Take 1 tablet (400 mg total) by mouth daily.   naloxegol oxalate (MOVANTIK) 25 MG TABS tablet Take 1 tablet (25 mg total) by mouth daily.   Omega-3 Fatty Acids (OMEGA III EPA+DHA) 1000 MG CAPS Take by mouth.   Spacer/Aero-Holding Chambers (AEROCHAMBER MV) inhaler Use as instructed   traMADol (ULTRAM) 50 MG tablet Take 100 mg by mouth 3 (three) times daily. 2x daily   valACYclovir (VALTREX) 500 MG tablet Take 500 mg by mouth 2 (two) times daily as needed (outbreaks).    Vitamin D, Cholecalciferol, 25 MCG (1000 UT) CAPS Take 1 capsule by mouth daily.   Aspirin-Acetaminophen-Caffeine (MIGRAINE RELIEF PO) Take 1 tablet by mouth as needed.   No facility-administered encounter medications on file as of 10/24/2023.     Review of Systems  Review of Systems  No chest pain with exertion.  No orthopnea or PND.  Comprehensive review of systems otherwise negative. Physical Exam  BP 134/82 (BP Location: Left Arm, Cuff Size: Normal)   Pulse (!) 107   Ht 5' 2.5" (1.588 m)   Wt 185 lb 6.4 oz (84.1 kg)   SpO2 98%   BMI 33.37 kg/m   Wt Readings from Last 5 Encounters:  10/24/23 185 lb 6.4 oz (84.1 kg)  10/16/23 180 lb (81.6 kg)  08/21/23 178 lb 6 oz (80.9 kg)  04/17/23 184 lb (83.5 kg)  03/31/23 180 lb (81.6 kg)    BMI Readings from Last 5 Encounters:  10/24/23 33.37 kg/m  10/16/23 32.92 kg/m  08/21/23  32.63 kg/m  04/17/23 32.59 kg/m  03/31/23 32.92 kg/m     Physical Exam General: Sitting in chair, in no acute distress Eyes: EOMI, no icterus Pulmonary: Clear, no wheeze, normal work of breathing Cardiovascular: Warm, no edema Abdomen: Nondistended MSK: No synovitis, no joint effusions Neuro: Normal gait, no weakness Psych: Normal mood, full affect   Assessment & Plan:   Moderate persistent asthma: Currently on Breo.  Present Symbicort.  Seasonal fluctuations.  Continue Breo for now, recently resumed using daily..  Recent exacerbation be a viral illness it sounds like.  Some wheezing mildly clear on exam.  Consider escalation to Trelegy if ongoing symptoms despite good adherence with  Seasonal allergies/sinusitis: Encouraged to continue montelukast, Flonase.  Continue recently prescribed ipratropium.  Add sinus rinses.  Moxifloxacin for possible sinus infection given duration of symptoms.  Unclear if triggered by recent viral infection versus problems with emerging with bladder injuries etc.  Return in about 3 months (around 01/24/2024) for f/u Dr. Judeth Horn.   Karren Burly, MD 10/24/2023   This appointment required 40 minutes of patient care (this includes precharting, chart review, review of results, face-to-face care, etc.).

## 2023-10-25 ENCOUNTER — Other Ambulatory Visit: Payer: Self-pay | Admitting: Family Medicine

## 2023-10-25 ENCOUNTER — Other Ambulatory Visit: Payer: Self-pay

## 2023-10-25 DIAGNOSIS — M5416 Radiculopathy, lumbar region: Secondary | ICD-10-CM | POA: Diagnosis not present

## 2023-10-25 DIAGNOSIS — Z5181 Encounter for therapeutic drug level monitoring: Secondary | ICD-10-CM | POA: Diagnosis not present

## 2023-10-25 DIAGNOSIS — Z79899 Other long term (current) drug therapy: Secondary | ICD-10-CM | POA: Diagnosis not present

## 2023-10-25 DIAGNOSIS — G894 Chronic pain syndrome: Secondary | ICD-10-CM | POA: Diagnosis not present

## 2023-10-25 MED ORDER — AJOVY 225 MG/1.5ML ~~LOC~~ SOAJ
225.0000 mg | SUBCUTANEOUS | 3 refills | Status: AC
Start: 2023-10-25 — End: ?

## 2023-11-06 ENCOUNTER — Telehealth: Payer: Self-pay

## 2023-11-06 ENCOUNTER — Other Ambulatory Visit (HOSPITAL_COMMUNITY): Payer: Self-pay

## 2023-11-06 ENCOUNTER — Ambulatory Visit (INDEPENDENT_AMBULATORY_CARE_PROVIDER_SITE_OTHER): Payer: Medicare Other | Admitting: Neurology

## 2023-11-06 ENCOUNTER — Telehealth: Payer: Self-pay | Admitting: *Deleted

## 2023-11-06 ENCOUNTER — Encounter: Payer: Self-pay | Admitting: Neurology

## 2023-11-06 VITALS — BP 140/84 | HR 100 | Ht 62.0 in | Wt 181.4 lb

## 2023-11-06 DIAGNOSIS — M5481 Occipital neuralgia: Secondary | ICD-10-CM

## 2023-11-06 DIAGNOSIS — G500 Trigeminal neuralgia: Secondary | ICD-10-CM

## 2023-11-06 DIAGNOSIS — I10 Essential (primary) hypertension: Secondary | ICD-10-CM | POA: Diagnosis not present

## 2023-11-06 DIAGNOSIS — G43709 Chronic migraine without aura, not intractable, without status migrainosus: Secondary | ICD-10-CM

## 2023-11-06 DIAGNOSIS — J3089 Other allergic rhinitis: Secondary | ICD-10-CM | POA: Diagnosis not present

## 2023-11-06 MED ORDER — TIMOLOL MALEATE 0.5 % OP SOLN: OPHTHALMIC | 12 refills | Status: AC

## 2023-11-06 NOTE — Progress Notes (Signed)
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 Hives, Itching, Rash, and Other (See Comments) 03/19/1994   Codeine Other (See Comments) 07/17/2018   Ibuprofen Other (See Comments) and Nausea And Vomiting 09/03/2016   Latex Rash and Other (See Comments) 03/07/2011   Omeprazole magnesium Other (See Comments) 12/25/2019   Pantoprazole sodium Other (See Comments) 12/25/2019   Penicillamine Rash 12/26/2019   Pregabalin Other (See Comments) 12/25/2019   Phenazopyridine  08/21/2023   Pyridium [phenazopyridine hcl] Swelling 06/02/2022    Vitals: BP (!) 140/84 (BP Location: Right Arm, Patient Position: Sitting, Cuff Size: Normal)   Pulse 100   Ht 5\' 2"  (1.575 m)   Wt 181 lb 6.4 oz (82.3 kg)   SpO2 94%   BMI 33.18 kg/m  Last Weight:  Wt Readings from Last 1 Encounters:  11/06/23 181 lb 6.4 oz (82.3 kg)   Last Height:   Ht Readings from Last 1 Encounters:  11/06/23 5\' 2"  (1.575 m)   Physical exam: Exam: Gen: NAD, conversant      CV: No palpitations or chest pain or SOB. VS: Breathing at a normal rate. Not febrile. Eyes: Conjunctivae clear without exudates or hemorrhage  Neuro: Detailed Neurologic Exam  Speech:    Speech is  normal; fluent and spontaneous with normal comprehension.  Cognition:    The patient is oriented to person, place, and time;     recent and remote memory intact;     language fluent;     normal attention, concentration, fund of knowledge Cranial Nerves:    The pupils are equal, round, and reactive to light. Visual fields are full Extraocular movements are intact.  The face is symmetric with normal sensation. The palate elevates in the midline. Hearing intact. Voice is normal. Shoulder shrug is normal. The tongue has normal motion without fasciculations.   Coordination: normal  Gait:    No abnormalities noted or reported  Motor Observation:   no involuntary movements noted. Tone:    Appears normal  Posture:    Posture is normal. normal erect    Strength:    Strength is anti-gravity and symmetric in the upper and lower limbs.      Sensation: intact to LT, no reports of numbness or tingling or paresthesias          Assessment/Plan:  This is a patient with multiple chronic pain conditions and has been followed by multiple specialists and pain clinics for a plethora of conditions.  She is seeing Dr. Ronne Binning, Dr.'s [facility], but now follows with [facility] and feels comfortable there.  She has been treated here in our clinic for migraines, occipital neuralgia and trigeminal neuralgia.  She is tried an impressive amount of medications and procedures for her disorders including: Tylenol, amitriptyline, baclofen, ajovy,emgality,nurtec,ubrelvy,Celebrex, Decadron injections, Emgality, Benadryl injections, gabapentin, labetalol, magnesium oxide, melatonin, Robaxin, Solu-Medrol, Medrol Dosepak, Zofran, Botox, prednisone tablets, Nurtec, Flexeril, propranolol, Depakote, Celexa, Nucynta, Topamax, tramadol, Excedrin, Tegretol, Lyrica, she is followed by [facility], she had a nerve SCS placed 01/19/2021 in her back, she has had sphenopalatine ganglion blocks (last 01/2021  and 02/2021), occipital nerve block (last 8/1), she has been to Dr. Ronne Binning for c2/c3 medial branch blocks, she has been given nerve blocks at our office, she has had a sleep study here in our office which was negative and in fact she slept reasonably  well and achieved all stages of sleep, she has had dry needling.  She has had extensive testing including MRIs, CTs MRAs, she has had decompressive surgery for her trigeminal neuralgia for vascular compression, she has been seen at [facility] and [facility] [place].  Continue Ajovy for prevention and Botox: doing very well on both she only has 4 migraine days a month and < 8-10 total headache days a month.   Continue wit [facility], doin well with spg blocks for tgn and procedures for occipital neuralgia  Acute management try:  Lidocaine nasal spray 4% to be used on an as-needed basis for headache.  Take 1-2 sprays in the nostril on the affected headache side.  May repeat in 10 minutes if need be.    Another consideration could be eye drops timolol,  timolol maleate, 0.5%, 1-2 drops eyes at onset and 30 minutes after if needed. Can repeat in 2 hours for headache/migraine.  -Patient has been evaluated and treated by multiple institutions, neurologist and neurosurgeons including prestigious centers such as [place], [address], [place] [facility] [place].  She has had extensive imaging including MRIs with trigeminal protocol, MRAs, as well as decompressive surgery for vascular loop that was compressing the trigeminal nerve.  She has tried a multitude of medications as above.   Meds ordered this encounter  Medications   timolol (TIMOPTIC) 0.5 % ophthalmic solution    Sig: 1-2 drops eyes at onset and 30 minutes after if needed. Can repeat in 2 hours for headache/migraine.    Dispense:  10 mL    Refill:  12      Cc: Stallings, Zoe, Dr Cherrie Distance   Naomie Dean, MD  [facility] [address] [place],  [place] [address]  I spent over 30 minutes of face-to-face and non-face-to-face time with patient on the  1. Chronic migraine without aura without status migrainosus, not intractable   2. Trigeminal neuralgia of right side of face   3. Occipital neuralgia of right side    diagnosis.  This included previsit chart review, lab review, study review, order entry, electronic health record documentation, patient education on the different diagnostic and therapeutic options, counseling and coordination of care, risks and benefits of management, compliance, or risk factor reduction

## 2023-11-06 NOTE — Telephone Encounter (Signed)
 I called [facility] and gave v.o. per Dr Lucia Gaskins for 4% Lidocaine nasal spray, 1 spray in nostril on affected headache side prn headache. May repeat in 10 minutes if needed. They dispense 30 mL bottle. I did not authorize any further refills at this time. Medication has been added to pt's MAR and I sent her a mychart message with update.

## 2023-11-06 NOTE — Telephone Encounter (Signed)
 From Dr Lucia Gaskins:   for Ms. Trim can we call a compounding pharmacy and see if we can get lidocaine spray in a nasal spray thingy for HQI:[id number] nasal spray 4% to be used on an as-needed basis for headache.  Take 1 spray in the nostril on the affected headache side.  May repeat in 10 minutes if need be.     ask how many ml in each spray its reusable so we can try I spray bottle

## 2023-11-06 NOTE — Telephone Encounter (Signed)
 Pharmacy states PA Ajovy needed.

## 2023-11-06 NOTE — Telephone Encounter (Signed)
 Pharmacy Patient Advocate Encounter   Received notification from Physician's Office that prior authorization for Ajovy is required/requested.   Insurance verification completed.   The patient is insured through [facility] .   Per test claim: PA required; PA submitted to above mentioned insurance via CoverMyMeds Key/confirmation #/EOC [facility] Status is pending

## 2023-11-06 NOTE — Patient Instructions (Signed)
 Continue Ajovy for prevention and Botox  Acute management try:  Lidocaine nasal spray 4% to be used on an as-needed basis for headache.  Take 1-2 sprays in the nostril on the affected headache side.  May repeat in 10 minutes if need be.    Another consideration could be eye drops timolol,  timolol maleate, 0.5%, 1-2 drops eyes at onset and 30 minutes after if needed. Can repeat in 2 hours for headache/migraine.  Timolol Eye Solution What is this medication? TIMOLOL (TIM oh lol) treats conditions with increased pressure of the eye, such as glaucoma. It works by decreasing the amount of fluid in the eye, which helps lower eye pressure. It belongs to a group of medications called beta-blockers. This medicine may be used for other purposes; ask your health care provider or pharmacist if you have questions. COMMON BRAND NAME(S): Betimol, Istalol, Timoptic, Timoptic Ocudose, Timoptic Ocumeter What should I tell my care team before I take this medication? They need to know if you have any of these conditions: Diabetes Having surgery Heart or blood vessel conditions, such as slow heartbeat, heart failure, heart block Lung or breathing disease, such as asthma or COPD Myasthenia gravis Thyroid disease Wear contact lenses An unusual or allergic reaction to timolol, other medications, foods, dyes, or preservatives Pregnant or trying to get pregnant Breastfeeding How should I use this medication? This medication is only for use in the eye. Follow the directions on the prescription label. Wash hands before and after use. Tilt your head back slightly and pull your lower eyelid down with your index finger to form a pouch. Try not to touch the tip of the dropper or tube to your eye, fingertips, or other surfaces. Squeeze the prescribed number of drops into the pouch. Close the eye gently to spread the drops. Do not use your medication more often than directed. Do not stop using except on the advice of your  care team. Talk to your care team about the use of this medication in children. While it may be prescribed for children as young as 2 years for selected conditions, precautions do apply. Overdosage: If you think you have taken too much of this medicine contact a poison control center or emergency room at once. NOTE: This medicine is only for you. Do not share this medicine with others. What if I miss a dose? If you miss a dose, use it as soon as you can. If it is almost time for your next dose, use only that dose. Do not use double or extra doses. What may interact with this medication? Do not use other eye products without talking to your care team. This list may not describe all possible interactions. Give your health care provider a list of all the medicines, herbs, non-prescription drugs, or dietary supplements you use. Also tell them if you smoke, drink alcohol, or use illegal drugs. Some items may interact with your medicine. What should I watch for while using this medication? Visit your care team for regular checks on your progress. Tell your care team if your symptoms do not start to get better or if they get worse. If you are going to need surgery or a procedure, tell your care team that you are using this medication. Remove soft contact lenses before using these eye drops. You can put your contact lenses back in 15 minutes after you use the eye drops. What side effects may I notice from receiving this medication? Side effects that you should report  to your care team as soon as possible: Allergic reactions--skin rash, itching, hives, swelling of the face, lips, tongue, or throat New or worsening eye pain, redness, irritation, or discharge Side effects that usually do not require medical attention (report to your care team if they continue or are bothersome): Blurry vision Headache Irritation at application site This list may not describe all possible side effects. Call your doctor for  medical advice about side effects. You may report side effects to FDA at 1-800-FDA-1088. Where should I keep my medication? Keep out of the reach of children and pets. Store at room temperature. Protect from light. See product label for storage information. Each product may have different instructions. Get rid of any unused medication as instructed or after the expiration date, whichever is first. To get rid of medications that are no longer needed or have expired: Take the medication to a medication take-back program. Check with your pharmacy or law enforcement to find a location. If you cannot return the medication, check the label or package insert to see if the medication should be thrown out in the garbage or flushed down the toilet. If you are not sure, ask your care team. If it is safe to put it in the trash, empty the medication out of the container. Mix the medication with cat litter, dirt, coffee grounds, or other unwanted substance. Seal the mixture in a bag or container. Put it in the trash. NOTE: This sheet is a summary. It may not cover all possible information. If you have questions about this medicine, talk to your doctor, pharmacist, or health care provider.  2024 Elsevier/Gold Standard (2023-07-07 00:00:00)

## 2023-11-08 ENCOUNTER — Other Ambulatory Visit (HOSPITAL_COMMUNITY): Payer: Self-pay

## 2023-11-08 ENCOUNTER — Other Ambulatory Visit: Payer: Self-pay | Admitting: Internal Medicine

## 2023-11-08 ENCOUNTER — Other Ambulatory Visit: Payer: Self-pay | Admitting: Nurse Practitioner

## 2023-11-08 NOTE — Telephone Encounter (Signed)
**Note De-identified  [place] Obfuscation** Please advise 

## 2023-11-08 NOTE — Telephone Encounter (Signed)
 Received a faxed form requesting additional information-faxed completed forms along with notes to [facility] at [phone]

## 2023-11-09 ENCOUNTER — Other Ambulatory Visit (HOSPITAL_COMMUNITY): Payer: Self-pay

## 2023-11-09 NOTE — Telephone Encounter (Signed)
 Pharmacy Patient Advocate Encounter  Received notification from [facility] that Prior Authorization for AJOVY (fremanezumab-vfrm) injection 225MG /1.5ML auto-injectors has been APPROVED from 11/08/2023 to 11/07/2024. Ran test claim, Copay is $120.00 per 84DS. This test claim was processed through [facility]- copay amounts may vary at other pharmacies due to pharmacy/plan contracts, or as the patient moves through the different stages of their insurance plan.   PA #/Case ID/Reference #: PA Case ID #: [id number]

## 2023-11-09 NOTE — Telephone Encounter (Signed)
 Noted.

## 2023-11-16 NOTE — Telephone Encounter (Signed)
 Inbound call from patient, requesting refill for dexilant.

## 2023-11-17 ENCOUNTER — Other Ambulatory Visit: Payer: Self-pay | Admitting: Physician Assistant

## 2023-11-17 DIAGNOSIS — K582 Mixed irritable bowel syndrome: Secondary | ICD-10-CM

## 2023-11-20 ENCOUNTER — Other Ambulatory Visit: Payer: Self-pay | Admitting: *Deleted

## 2023-11-20 NOTE — Telephone Encounter (Signed)
 Patient called to follow up on thi refill request. She is highly concerned because she has not heard from anyone and needs her medication. Patient stated she no longer has anything available.

## 2023-11-21 ENCOUNTER — Telehealth: Payer: Self-pay | Admitting: Gastroenterology

## 2023-11-21 MED ORDER — DEXLANSOPRAZOLE 60 MG PO CPDR: 1.0000 | DELAYED_RELEASE_CAPSULE | Freq: Every day | ORAL | 3 refills | Status: DC

## 2023-11-21 MED ORDER — AMITRIPTYLINE HCL 50 MG PO TABS: 50.0000 mg | ORAL_TABLET | Freq: Every day | ORAL | 1 refills | Status: DC

## 2023-11-21 NOTE — Telephone Encounter (Signed)
 [facility]. I made patient an appointment in July for follow up.  She wanted me to let Dr Leonia Raman know that Movantik and Linzess is causing her to be bloated and abdominal discomfort. She takes tramadol for pain as needed. Her Pain Management doctor gave her Relistor 150 mg and she wanted us  to know for constipation. She wants to know if she could take Amitiza and Linzess together, I told her no but I would speak with Dr Leonia Raman when she gets back next week. I asked her to use Miralax and she said that does nothing for her.    She stated the pharmacy now has her prescription  FYI Dr Nandigam

## 2023-11-21 NOTE — Telephone Encounter (Signed)
 Requesting medication refill for dexillant. States pharmacy has been sending for prescription renewal. Please advise.   [facility] [address]   Also requesting to speak with a nurse in regards to plan of care. Please advise.

## 2023-12-04 DIAGNOSIS — G894 Chronic pain syndrome: Secondary | ICD-10-CM | POA: Diagnosis not present

## 2023-12-04 DIAGNOSIS — M533 Sacrococcygeal disorders, not elsewhere classified: Secondary | ICD-10-CM | POA: Diagnosis not present

## 2023-12-04 DIAGNOSIS — M961 Postlaminectomy syndrome, not elsewhere classified: Secondary | ICD-10-CM | POA: Diagnosis not present

## 2023-12-04 DIAGNOSIS — M5416 Radiculopathy, lumbar region: Secondary | ICD-10-CM | POA: Diagnosis not present

## 2023-12-04 DIAGNOSIS — M47816 Spondylosis without myelopathy or radiculopathy, lumbar region: Secondary | ICD-10-CM | POA: Diagnosis not present

## 2023-12-06 ENCOUNTER — Ambulatory Visit: Admitting: Allergy

## 2023-12-07 ENCOUNTER — Telehealth: Payer: Self-pay | Admitting: Gastroenterology

## 2023-12-07 MED ORDER — LINACLOTIDE 290 MCG PO CAPS: 290.0000 ug | ORAL_CAPSULE | Freq: Every day | ORAL | 3 refills | Status: DC

## 2023-12-07 MED ORDER — DEXLANSOPRAZOLE 60 MG PO CPDR: 1.0000 | DELAYED_RELEASE_CAPSULE | Freq: Every day | ORAL | 3 refills | Status: DC

## 2023-12-07 NOTE — Telephone Encounter (Signed)
 Patient called and stated that she Is needing a refill on her Linzess  and she is not sure why she was called in a refill for 1 month when it should have been for 3 months. Patient stated that she would like a call back from [place] Please advise.

## 2023-12-07 NOTE — Telephone Encounter (Signed)
 Well the first message that came to me asked for a Linzess  refill not Dexilant . But sending a 90 day  refill of dexilant  too

## 2023-12-07 NOTE — Telephone Encounter (Signed)
 Inbound call from patient stating that she did not mention which medication she needed a refill for and stated that she needs a refill for Dexilant  90 days instead of the Linzess . Requesting prescription be sent. Please advise.Aaron Aas

## 2023-12-07 NOTE — Telephone Encounter (Signed)
 Left message that I would send in a 90 day supply of Linzess  to [facility] with 3 refills

## 2023-12-07 NOTE — Addendum Note (Signed)
 Addended by: Chrisandra Counts on: 12/07/2023 12:54 PM   Modules accepted: Orders

## 2023-12-08 ENCOUNTER — Encounter: Payer: Self-pay | Admitting: Family Medicine

## 2023-12-08 ENCOUNTER — Other Ambulatory Visit (HOSPITAL_COMMUNITY): Payer: Self-pay

## 2023-12-08 ENCOUNTER — Telehealth: Payer: Self-pay | Admitting: Neurology

## 2023-12-08 DIAGNOSIS — M5481 Occipital neuralgia: Secondary | ICD-10-CM

## 2023-12-08 NOTE — Telephone Encounter (Signed)
 Dr. Tresia Fruit- do you agree to bring her in for this? If so, is there somewhere you would agree to work her in?

## 2023-12-08 NOTE — Telephone Encounter (Signed)
 Pt also called. Saw Dr. Tresia Fruit last. Call sent to MD to review.

## 2023-12-08 NOTE — Telephone Encounter (Signed)
 Pt is asking for a call to discuss her request for an occipital block.  Pt is also sending a request thru my chart as well

## 2023-12-08 NOTE — Telephone Encounter (Signed)
 I do not perform the occipital nerve blocks anymore.  She can have a follow-up over video with [name] if Amy has an opening or cancellation and Amy can discuss with their or if occipital nerve blocks worked very well in the past we can refer to Dr. Sandy Crumb as he performs these.  Thank you.

## 2023-12-11 ENCOUNTER — Telehealth: Payer: Self-pay | Admitting: Family Medicine

## 2023-12-11 DIAGNOSIS — I10 Essential (primary) hypertension: Secondary | ICD-10-CM | POA: Diagnosis not present

## 2023-12-11 DIAGNOSIS — K5900 Constipation, unspecified: Secondary | ICD-10-CM | POA: Diagnosis not present

## 2023-12-11 DIAGNOSIS — R7303 Prediabetes: Secondary | ICD-10-CM | POA: Diagnosis not present

## 2023-12-11 NOTE — Telephone Encounter (Signed)
 LVM for pt to call office

## 2023-12-11 NOTE — Telephone Encounter (Signed)
 Took call from [place] in phone room. Pt preferred to do mychart VV with [name],NP tomorrow and have referral placed. I scheduled and asked she login around 1015am. Referral placed.

## 2023-12-11 NOTE — Telephone Encounter (Signed)
Transferred call to Emma

## 2023-12-11 NOTE — Telephone Encounter (Signed)
 Mychart message sent.

## 2023-12-11 NOTE — Progress Notes (Deleted)
   PATIENT: Cassidy Olsen DOB: 08/06/1961  REASON FOR VISIT: follow up HISTORY FROM: patient  Virtual Visit via MyChart video  I connected with Cassidy Olsen on 12/11/23 at 10:30 AM EDT via MyChart video and verified that I am speaking with the correct person using two identifiers.   I discussed the limitations, risks, security and privacy concerns of performing an evaluation and management service by Mychart video and the availability of in person appointments. I also discussed with the patient that there may be a patient responsible charge related to this service. The patient expressed understanding and agreed to proceed.   History of Present Illness:  12/11/23 ALL: Cassidy Olsen is a 63 y.o. female here today for follow up. She is followed by me for migraines, trigeminal and occipital neuralgia and continues Botox , Ajovy , gabapentin  600mg  TID and amitriptyline  50mg  daily. She saw Dr Tresia Fruit 10/2023 and she added lidocaine  spray for TGN. She continues to see Dr Garald Jumbo with [facility] for TGN and ON s/p RFA. She called, yesterday, requesting Dr Tresia Fruit do occipital nerve block but these are no longer performed in our office. Referral was placed to Dr Sandy Crumb.    Observations/Objective:  Generalized: Well developed, in no acute distress  Mentation: Alert oriented to time, place, history taking. Follows all commands speech and language fluent   Assessment and Plan:  63 y.o. year old female  has a past medical history of ADHD, Arthritis (2010), Asthma, Back pain, Chronic headaches, Chronic pain, Constipation, Edema of both lower extremities, Family history of breast cancer, Family history of prostate cancer, Gallstones (2018), GERD (gastroesophageal reflux disease), High cholesterol, History of kidney stones, UTI (urinary tract infection), Hypertension, IBS (irritable bowel syndrome), Interstitial cystitis (2012), Joint pain, Kidney problem, Migraine, Occipital  neuralgia, Osteoarthritis, PNA (pneumonia) (2018), Posttraumatic muscle contracture (HCC), Pre-diabetes, Spinal stenosis, and Trigeminal neuralgia. here with  No diagnosis found.   No orders of the defined types were placed in this encounter.   No orders of the defined types were placed in this encounter.    Follow Up Instructions:  I discussed the assessment and treatment plan with the patient. The patient was provided an opportunity to ask questions and all were answered. The patient agreed with the plan and demonstrated an understanding of the instructions.   The patient was advised to call back or seek an in-person evaluation if the symptoms worsen or if the condition fails to improve as anticipated.  I provided *** minutes of face-to-face and non face-to-face time during this MyChart video encounter. Patient located at their place of residence. Provider is in the office.    Elijha Dedman, NP

## 2023-12-11 NOTE — Addendum Note (Signed)
 Addended by: Oneita Bihari on: 12/11/2023 02:34 PM   Modules accepted: Orders

## 2023-12-11 NOTE — Telephone Encounter (Signed)
 I message pt back in mychart.

## 2023-12-12 ENCOUNTER — Telehealth: Admitting: Family Medicine

## 2023-12-14 DIAGNOSIS — G894 Chronic pain syndrome: Secondary | ICD-10-CM | POA: Diagnosis not present

## 2023-12-14 DIAGNOSIS — M5416 Radiculopathy, lumbar region: Secondary | ICD-10-CM | POA: Diagnosis not present

## 2023-12-14 DIAGNOSIS — M2569 Stiffness of other specified joint, not elsewhere classified: Secondary | ICD-10-CM | POA: Diagnosis not present

## 2023-12-14 DIAGNOSIS — M6281 Muscle weakness (generalized): Secondary | ICD-10-CM | POA: Diagnosis not present

## 2023-12-14 NOTE — Telephone Encounter (Signed)
 Agree, do not recommend taking Amitiza  and Linzess  together.  She can use MiraLAX 1 capful daily at bedtime in addition to Linzess .

## 2023-12-15 ENCOUNTER — Other Ambulatory Visit (HOSPITAL_COMMUNITY): Payer: Self-pay

## 2023-12-27 ENCOUNTER — Other Ambulatory Visit: Payer: Self-pay

## 2023-12-27 DIAGNOSIS — M6281 Muscle weakness (generalized): Secondary | ICD-10-CM | POA: Diagnosis not present

## 2023-12-27 DIAGNOSIS — M2569 Stiffness of other specified joint, not elsewhere classified: Secondary | ICD-10-CM | POA: Diagnosis not present

## 2023-12-27 DIAGNOSIS — M5416 Radiculopathy, lumbar region: Secondary | ICD-10-CM | POA: Diagnosis not present

## 2023-12-27 DIAGNOSIS — G894 Chronic pain syndrome: Secondary | ICD-10-CM | POA: Diagnosis not present

## 2023-12-27 MED ORDER — BOTOX 200 UNITS IJ SOLR
INTRAMUSCULAR | 1 refills | Status: DC
  Filled 2023-12-27: qty 1, fill #0
  Filled 2023-12-27: qty 1, 84d supply, fill #0
  Filled 2024-03-27: qty 1, 84d supply, fill #1

## 2023-12-27 NOTE — Progress Notes (Signed)
 Specialty Pharmacy Refill Coordination Note  Cassidy Olsen is a 63 y.o. female contacted today regarding refills of specialty medication(s) OnabotulinumtoxinA  (Botox )   Patient requested Courier to Provider Office   Delivery date: 12/28/23   Verified address: [facility] [address]   Medication will be filled on 12/27/23.

## 2023-12-27 NOTE — Telephone Encounter (Signed)
 Please send Botox refills to [facility], thank you!

## 2023-12-27 NOTE — Telephone Encounter (Signed)
 REFILLED BOTOX  TO [facility]

## 2023-12-27 NOTE — Addendum Note (Signed)
 Addended by: Randi Buster on: 12/27/2023 12:08 PM   Modules accepted: Orders

## 2023-12-29 DIAGNOSIS — M5416 Radiculopathy, lumbar region: Secondary | ICD-10-CM | POA: Diagnosis not present

## 2023-12-29 DIAGNOSIS — M6281 Muscle weakness (generalized): Secondary | ICD-10-CM | POA: Diagnosis not present

## 2023-12-29 DIAGNOSIS — M2569 Stiffness of other specified joint, not elsewhere classified: Secondary | ICD-10-CM | POA: Diagnosis not present

## 2023-12-29 DIAGNOSIS — Z981 Arthrodesis status: Secondary | ICD-10-CM | POA: Diagnosis not present

## 2023-12-29 DIAGNOSIS — G894 Chronic pain syndrome: Secondary | ICD-10-CM | POA: Diagnosis not present

## 2023-12-29 DIAGNOSIS — M549 Dorsalgia, unspecified: Secondary | ICD-10-CM | POA: Diagnosis not present

## 2023-12-29 DIAGNOSIS — M5136 Other intervertebral disc degeneration, lumbar region with discogenic back pain only: Secondary | ICD-10-CM | POA: Diagnosis not present

## 2023-12-29 DIAGNOSIS — M545 Low back pain, unspecified: Secondary | ICD-10-CM | POA: Diagnosis not present
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
## 2024-01-02 ENCOUNTER — Ambulatory Visit (INDEPENDENT_AMBULATORY_CARE_PROVIDER_SITE_OTHER): Payer: Medicare Other | Admitting: Family Medicine

## 2024-01-02 ENCOUNTER — Encounter: Payer: Self-pay | Admitting: Family Medicine

## 2024-01-02 VITALS — BP 126/91 | HR 99

## 2024-01-02 DIAGNOSIS — G43709 Chronic migraine without aura, not intractable, without status migrainosus: Secondary | ICD-10-CM | POA: Diagnosis not present

## 2024-01-02 DIAGNOSIS — G894 Chronic pain syndrome: Secondary | ICD-10-CM | POA: Diagnosis not present

## 2024-01-02 DIAGNOSIS — M2569 Stiffness of other specified joint, not elsewhere classified: Secondary | ICD-10-CM | POA: Diagnosis not present

## 2024-01-02 DIAGNOSIS — M5416 Radiculopathy, lumbar region: Secondary | ICD-10-CM | POA: Diagnosis not present

## 2024-01-02 DIAGNOSIS — M6281 Muscle weakness (generalized): Secondary | ICD-10-CM | POA: Diagnosis not present

## 2024-01-02 MED ORDER — ONABOTULINUMTOXINA 200 UNITS IJ SOLR
155.0000 [IU] | Freq: Once | INTRAMUSCULAR | Status: AC
  Administered 2024-01-02: 155 [IU] via INTRAMUSCULAR

## 2024-01-02 NOTE — Progress Notes (Signed)
 Botox - 200 units x 1 vial Lot: [id number] Expiration: 05/2026 NDC: 4540-9811-91  Bacteriostatic 0.9% Sodium Chloride - 4 mL  Lot: [id number] Expiration: 02/04/2025 NDC: 6213-0865-78  Dx: I69.629 S/P  Witnessed by Mollie Anger, RMA

## 2024-01-05 DIAGNOSIS — M5481 Occipital neuralgia: Secondary | ICD-10-CM | POA: Diagnosis not present

## 2024-01-08 DIAGNOSIS — M2569 Stiffness of other specified joint, not elsewhere classified: Secondary | ICD-10-CM | POA: Diagnosis not present

## 2024-01-08 DIAGNOSIS — M5416 Radiculopathy, lumbar region: Secondary | ICD-10-CM | POA: Diagnosis not present

## 2024-01-08 DIAGNOSIS — G894 Chronic pain syndrome: Secondary | ICD-10-CM | POA: Diagnosis not present

## 2024-01-08 DIAGNOSIS — M6281 Muscle weakness (generalized): Secondary | ICD-10-CM | POA: Diagnosis not present

## 2024-01-11 DIAGNOSIS — H25813 Combined forms of age-related cataract, bilateral: Secondary | ICD-10-CM | POA: Diagnosis not present

## 2024-01-11 DIAGNOSIS — H43823 Vitreomacular adhesion, bilateral: Secondary | ICD-10-CM | POA: Diagnosis not present

## 2024-01-15 DIAGNOSIS — M533 Sacrococcygeal disorders, not elsewhere classified: Secondary | ICD-10-CM | POA: Diagnosis not present

## 2024-01-22 ENCOUNTER — Other Ambulatory Visit: Payer: Self-pay

## 2024-01-22 ENCOUNTER — Encounter: Payer: Self-pay | Admitting: Gastroenterology

## 2024-01-22 DIAGNOSIS — M6281 Muscle weakness (generalized): Secondary | ICD-10-CM | POA: Diagnosis not present

## 2024-01-22 DIAGNOSIS — M2569 Stiffness of other specified joint, not elsewhere classified: Secondary | ICD-10-CM | POA: Diagnosis not present

## 2024-01-22 DIAGNOSIS — G894 Chronic pain syndrome: Secondary | ICD-10-CM | POA: Diagnosis not present

## 2024-01-22 DIAGNOSIS — M5416 Radiculopathy, lumbar region: Secondary | ICD-10-CM | POA: Diagnosis not present

## 2024-01-22 MED ORDER — NALOXEGOL OXALATE 25 MG PO TABS: 25.0000 mg | ORAL_TABLET | Freq: Every day | ORAL | 3 refills | Status: DC

## 2024-01-22 NOTE — Telephone Encounter (Signed)
 Inbound call from patient stating pharmacy has sent over request for refill for Movantik . Please advise, thank you.

## 2024-01-31 ENCOUNTER — Ambulatory Visit: Payer: Self-pay | Admitting: Internal Medicine

## 2024-01-31 DIAGNOSIS — M533 Sacrococcygeal disorders, not elsewhere classified: Secondary | ICD-10-CM | POA: Diagnosis not present

## 2024-01-31 DIAGNOSIS — M51369 Other intervertebral disc degeneration, lumbar region without mention of lumbar back pain or lower extremity pain: Secondary | ICD-10-CM | POA: Diagnosis not present

## 2024-01-31 DIAGNOSIS — J301 Allergic rhinitis due to pollen: Secondary | ICD-10-CM

## 2024-01-31 DIAGNOSIS — Z981 Arthrodesis status: Secondary | ICD-10-CM | POA: Diagnosis not present

## 2024-01-31 NOTE — Telephone Encounter (Signed)
 FYI Only or Action Required?: Action required by provider: medication refill request.  Patient is followed in Pulmonology for asthma, last seen on 10/24/2023 by Hunsucker, Donnice SAUNDERS, MD. Called Nurse Triage reporting Medication Refill.   Triage Disposition: Call PCP Now  Patient/caregiver understands and will follow disposition?: Yes                            Copied from CRM [id number]. Topic: Clinical - Red Word Triage >> Jan 31, 2024  3:46 PM Cassidy Olsen wrote: Red Word that prompted transfer to Nurse Triage: Pt stated she used to see Dr. Brenna and had recently completed a transfer of care appt with Dr. Annella. Pt stated she typically saw Dr. Brenna 1 time per year unless she was symptomatic. Pt stated she completed her last appt with Dr. Annella on 10/24/2023 roughly 3 months ago, and went to get a refill of her medication for montelukast  (SINGULAIR ) 10 MG tablet but was denied due to not seeing her provider first. Pt is upset and would like to have her medication filled. Pt is requesting to speak with a nurse right now.   Preferred pharmacy - [facility] [phone] [address], [place] - [address] AT [facility] OF [address] [place] [address] Phone: [phone] Fax: [phone] Hours: Open 24 hours  Pt's phone number - [phone] Reason for Disposition  [1] Prescription refill request for ESSENTIAL medicine (i.e., likelihood of harm to patient if not taken) AND [2] triager unable to refill per department policy  Answer Assessment - Initial Assessment Questions Patient is calling requesting a refill of singulair . Patient states her pharmacy is stating she needs an appointment but patient states she was seen in March.  Protocols used: Medication Refill and Renewal Call-A-AH

## 2024-02-01 ENCOUNTER — Telehealth: Payer: Self-pay

## 2024-02-01 MED ORDER — MONTELUKAST SODIUM 10 MG PO TABS
10.0000 mg | ORAL_TABLET | Freq: Every day | ORAL | 0 refills | Status: DC
Start: 2024-02-01 — End: 2024-02-01

## 2024-02-01 MED ORDER — MONTELUKAST SODIUM 10 MG PO TABS: 10.0000 mg | ORAL_TABLET | Freq: Every day | ORAL | 0 refills | Status: DC

## 2024-02-01 MED ORDER — MONTELUKAST SODIUM 10 MG PO TABS: 10.0000 mg | ORAL_TABLET | Freq: Every day | ORAL | 1 refills | Status: DC

## 2024-02-01 NOTE — Telephone Encounter (Signed)
 Copied from CRM [id number]. Topic: Clinical - Prescription Issue >> Jan 31, 2024  3:36 PM Russell PARAS wrote: Reason for CRM:   Pt is contacting clinic due to having issues with her montelukast  refill request. She spoke with an agent earlier who assisted her in canceling her 07/01 appt bc she did not feel it was necessary to follow up when she is doing fine. She reports she was under the understanding that if she had an appt scheduled that she would be provided a bridge refill until her appt; however, it was not.   She is extremely frustrated and upset and has been without the medication for 1 week. She will be leaving to go out of town on 07/06 and will be without medication longer.   Advised I could try to reschedule in the same time/date slot she had before. Pt refused and said she would be working that day and would not be able to come and did not see the point in rescheduling.    Called patient to inform it looks like it was sent over,patient said it was not going through looked at the order saw the class was print and informed I would resend it as normal so it will go to the pharmacy.NFN CB#  [phone]

## 2024-02-01 NOTE — Telephone Encounter (Addendum)
 Spoke with patient regarding prior message . Printed out a script for patient to pick up . Patient stated she will call back in late August to make a 6 month f/u with Dr.Hunsucker. Patient's voice was understanding.Nothing else further needed

## 2024-02-05 DIAGNOSIS — Z5181 Encounter for therapeutic drug level monitoring: Secondary | ICD-10-CM | POA: Diagnosis not present

## 2024-02-05 DIAGNOSIS — M5416 Radiculopathy, lumbar region: Secondary | ICD-10-CM | POA: Diagnosis not present

## 2024-02-05 DIAGNOSIS — M5481 Occipital neuralgia: Secondary | ICD-10-CM | POA: Diagnosis not present

## 2024-02-05 DIAGNOSIS — G894 Chronic pain syndrome: Secondary | ICD-10-CM | POA: Diagnosis not present

## 2024-02-05 DIAGNOSIS — M533 Sacrococcygeal disorders, not elsewhere classified: Secondary | ICD-10-CM | POA: Diagnosis not present

## 2024-02-05 DIAGNOSIS — Z79899 Other long term (current) drug therapy: Secondary | ICD-10-CM | POA: Diagnosis not present

## 2024-02-05 DIAGNOSIS — M47816 Spondylosis without myelopathy or radiculopathy, lumbar region: Secondary | ICD-10-CM | POA: Diagnosis not present

## 2024-02-05 DIAGNOSIS — M961 Postlaminectomy syndrome, not elsewhere classified: Secondary | ICD-10-CM | POA: Diagnosis not present

## 2024-02-06 ENCOUNTER — Ambulatory Visit: Admitting: Pulmonary Disease

## 2024-02-21 ENCOUNTER — Encounter: Payer: Self-pay | Admitting: Gastroenterology

## 2024-02-21 ENCOUNTER — Ambulatory Visit: Admitting: Gastroenterology

## 2024-02-21 ENCOUNTER — Telehealth: Payer: Self-pay | Admitting: Gastroenterology

## 2024-02-21 ENCOUNTER — Other Ambulatory Visit: Payer: Self-pay | Admitting: Neurology

## 2024-02-21 VITALS — BP 118/86 | HR 72 | Ht 62.0 in | Wt 181.1 lb

## 2024-02-21 DIAGNOSIS — M961 Postlaminectomy syndrome, not elsewhere classified: Secondary | ICD-10-CM | POA: Diagnosis not present

## 2024-02-21 DIAGNOSIS — M7061 Trochanteric bursitis, right hip: Secondary | ICD-10-CM | POA: Diagnosis not present

## 2024-02-21 DIAGNOSIS — Z9049 Acquired absence of other specified parts of digestive tract: Secondary | ICD-10-CM | POA: Diagnosis not present

## 2024-02-21 DIAGNOSIS — M47816 Spondylosis without myelopathy or radiculopathy, lumbar region: Secondary | ICD-10-CM | POA: Diagnosis not present

## 2024-02-21 DIAGNOSIS — M25561 Pain in right knee: Secondary | ICD-10-CM | POA: Diagnosis not present

## 2024-02-21 DIAGNOSIS — K219 Gastro-esophageal reflux disease without esophagitis: Secondary | ICD-10-CM

## 2024-02-21 DIAGNOSIS — M7062 Trochanteric bursitis, left hip: Secondary | ICD-10-CM | POA: Diagnosis not present

## 2024-02-21 DIAGNOSIS — K581 Irritable bowel syndrome with constipation: Secondary | ICD-10-CM

## 2024-02-21 DIAGNOSIS — G894 Chronic pain syndrome: Secondary | ICD-10-CM | POA: Diagnosis not present

## 2024-02-21 MED ORDER — DEXLANSOPRAZOLE 60 MG PO CPDR: 1.0000 | DELAYED_RELEASE_CAPSULE | Freq: Every day | ORAL | 3 refills | Status: DC

## 2024-02-21 MED ORDER — DICYCLOMINE HCL 10 MG PO CAPS: 20.0000 mg | ORAL_CAPSULE | Freq: Two times a day (BID) | ORAL | 3 refills | Status: AC

## 2024-02-21 MED ORDER — PRUCALOPRIDE SUCCINATE 2 MG PO TABS: 2.0000 mg | ORAL_TABLET | Freq: Every day | ORAL | 3 refills | Status: DC

## 2024-02-21 NOTE — Telephone Encounter (Signed)
 Inbound call from patient stating Motegrity  is not covered by her insurance and has a high out of pocket cost. States lubiprostone  was suppose to be called into her pharmacy bit she has not received it. Requesting follow up call. Please advise, thank you.

## 2024-02-21 NOTE — Patient Instructions (Addendum)
 VISIT SUMMARY:  Today, we discussed your ongoing issues with irritable bowel syndrome, constipation, acid reflux, and chronic pain. We reviewed your current medications and made some adjustments to help manage your symptoms more effectively.  YOUR PLAN:  IRRITABLE BOWEL SYNDROME: You have irritable bowel syndrome with intermittent abdominal pain, likely worsened by certain foods and scar tissue from previous surgeries. -Continue using dicyclomine  as needed for pain relief. Refills have been provided.  CONSTIPATION: You have chronic constipation, likely worsened by opioid use for pain management. -Continue taking Movantik  once daily at bedtime. -Take lubiprostone  twice daily. -Try the provided samples of Motegrity  if your current regimen is not effective. Will send Rx for Motegrity  2mg  daily -Continue to eat 20-30 grams of fiber daily and drink plenty of water .  CHOLECYSTECTOMY: You have persistent sensitivity and scar tissue in the area from your cholecystectomy, which may contribute to abdominal discomfort. -Try gentle abdominal massage with diluted essential oils to help with bowel movements.  GASTROESOPHAGEAL REFLUX DISEASE: Your acid reflux is well-managed with your current treatment. -Continue taking Dexilant  as prescribed.  HYPERTENSION: Your blood pressure is well-controlled with lisinopril, even after stopping amlodipine. -Continue taking lisinopril twice daily.  CHRONIC PAIN: You have chronic pain in your hips and knees, possibly worsened by amlodipine. -Continue taking tramadol  100 mg twice daily for pain management.  I appreciate the  opportunity to care for you  Thank You   Kavitha Nandigam , MD                 Contains text generated by [employer]bridge.

## 2024-02-21 NOTE — Progress Notes (Signed)
 Cassidy Olsen    [id number]    1960/11/17  Primary Care Physician:Samal, Zada, MD  Referring Physician: Florie Zada, MD [address] [place],  [place] [address]   Chief complaint:  GERD, constipation  Discussed the use of AI scribe software for clinical note transcription with the patient, who gave verbal consent to proceed.  History of Present Illness Cassidy Olsen is a 63 year old female with irritable bowel syndrome and acid reflux who presents for a follow-up visit.  Gastroesophageal reflux symptoms - Acid reflux is stable with Dexilant , effectively preventing heartburn and dysphagia - Occasional symptoms triggered by certain foods - No vomiting, regurgitation, or significant pain related to acid reflux  Irritable bowel syndrome symptoms - Does not use dicyclomine  or glycolol regularly, she takes it as needed  - Dicyclomine  is effective for pain relief when taken as needed (two capsules provide almost immediate relief) - Symptoms exacerbated by fried or spicy foods  Bowel habits and constipation - Significant constipation occurred about 1.5 months ago - Currently takes lubiprostone  at night and Linzess  in the morning, which improves sensation of bowel emptying - Has not tried lubiprostone  alone; combination with Linzess  and dietary changes (including green beans) results in 2-3 good bowel movements daily - Excessive fiber intake causes constipation; targets 20-30 grams of fiber daily - Drinks 90 ounces of water  daily  Laxative and prokinetic medication use - Currently takes Movantik  once daily at bedtime, finds it helpful  - Has trialed multiple other medications in the past, including Linzess  290 mcg, Trulance , Relistor, Miralax, and Dulcolax, with varying degrees of success and currently on lubiprostone  and Linzess  because only one of them is not very effective  Chronic pain and analgesic use - Takes tramadol  100 mg twice daily for hip and  knee pain - Pain severity rated at 7/10, interferes with sleep - Attributes pain to possible side effects of amlodipine - Has not taken amlodipine for 10 days; blood pressure remains stable - Currently uses lisinopril twice daily - Belbuca previously trialed but caused nausea  GI and other relevant Hx:     US  abd 04/13/2023 1. Prior cholecystectomy. 2. Increased hepatic parenchymal echogenicity suggestive of steatosis.   CT renal   Colonoscopy 12/02/2022 - Non- bleeding internal hemorrhoids.  - The examination was otherwise normal.   Colonoscopy February 2018, small adenomatous colon polyp removed and medium-sized hemorrhoids   EGD February 2018 showed hiatal hernia and gastritis.  Biopsies negative for H. pylori.  Duodenal biopsies negative for celiac.   Patient had work-up in [place] , HIDA scan and small bowel series insert negative for any significant pathology   Sister has celiac disease and her daughter has gluten sensitivity.  She had negative celiac testing, TTG IgA antibody was undetectable   S/p kidney stone removal 09/2019, has bilateral multiple small kidney stones was told it is residual fragments and she will eventually passed them     Abdominal ultrasound Jan 02, 2020: 1. Gallstones without sonographic evidence for acute cholecystitis or biliary dilatation 2. Bilateral kidney stones without hydronephrosis 3. Complicated cyst upper pole left kidney measuring 1.3 cm, could be more thoroughly characterized by MRI.   CT abdomen and pelvis with contrast December 2020 1. Obstructing 8 x 6 mm calculus at the left ureteropelvic junction resulting in mild-to-moderate left hydronephrosis with small volume perinephric fluid and stranding. Extensive left-sided urothelial enhancement and stranding raises suspicion for a superimposed infectious or inflammatory process. 2. Thickened  appearance of the urinary bladder, particularly along its superior margin. Recommend  correlation with urinalysis. 3. Multiple nonobstructing right renal calculi, largest measuring up to 9 mm. 4. Cholelithiasis without evidence for cholecystitis. 5. Previously seen left adnexal cyst has slightly decreased in size.   Echo 07-11-22 1.Left ventricular ejection fraction, by estimation, is 70 to 75%. The left ventricle has hyperdynamic function. The left ventricle has no regional wall motion abnormalities. There is mild concentric left ventricular hypertrophy. Left ventricular diastolic parameters are consistent with Grade I diastolic dysfunction (impaired relaxation).  2. Right ventricular systolic function is normal.  3.The right ventricular size is normal. The mitral valve is normal in structure. Trivial mitral valve regurgitation. No evidence of mitral stenosis.  4. The aortic valve is tricuspid. There is mild thickening of the aortic valve. Aortic valve regurgitation is not visualized. Aortic valve sclerosis is present, with no evidence of aortic valve stenosis.  5. The inferior vena cava is normal in size with greater than 50% respiratory variability, suggesting right atrial pressure of 3 mmHg.   Outpatient Encounter Medications as of 02/21/2024  Medication Sig   ACETAMINOPHEN  PO Take 1,300 mg by mouth 2 (two) times daily.   AMBULATORY NON FORMULARY MEDICATION 1 tablet 2 (two) times daily. Medication Name: Migrelief   amitriptyline  (ELAVIL ) 50 MG tablet Take 1 tablet (50 mg total) by mouth at bedtime.   amLODipine (NORVASC) 2.5 MG tablet Take 1 tablet by mouth daily.   atorvastatin  (LIPITOR) 20 MG tablet TAKE 1 TABLET BY MOUTH EVERY DAY   botulinum toxin Type A  (BOTOX ) 200 units injection INJECT 155 UNITS INTRAMUSCULARLY EVERY 12 WEEKS (GIVEN AT MD  OFFICE, DISCARD UNUSED)   celecoxib  (CELEBREX ) 200 MG capsule Take 200 mg by mouth 2 (two) times daily.   cetirizine (ZYRTEC) 10 MG tablet Take 10 mg by mouth daily.   dexlansoprazole  (DEXILANT ) 60 MG capsule Take 1 capsule (60 mg  total) by mouth daily.   dicyclomine  (BENTYL ) 10 MG capsule Take 1 by mouth twice daily. (Patient taking differently: Take 20 mg by mouth in the morning and at bedtime. PRN)   fluticasone  (FLONASE ) 50 MCG/ACT nasal spray Place 2 sprays into both nostrils daily.   fluticasone -salmeterol (WIXELA INHUB) 250-50 MCG/ACT AEPB Inhale 1 puff into the lungs in the morning and at bedtime.   Fremanezumab -vfrm (AJOVY ) 225 MG/1.5ML SOAJ Inject 225 mg into the skin every 30 (thirty) days.   gabapentin  (NEURONTIN ) 300 MG capsule Take 3 capsules (900 mg total) by mouth 3 (three) times daily.   glycopyrrolate  (ROBINUL ) 2 MG tablet Take 1 tablet (2 mg total) by mouth 2 (two) times daily.   lidocaine  4 % Place 1 patch onto the skin as needed.   linaCLOtide  (LINZESS  PO) Take by mouth.   linaclotide  (LINZESS ) 290 MCG CAPS capsule Take 1 capsule (290 mcg total) by mouth daily before breakfast.   lisinopril (ZESTRIL) 10 MG tablet Take 10 mg by mouth daily.   montelukast  (SINGULAIR ) 10 MG tablet Take 1 tablet (10 mg total) by mouth at bedtime.   naloxegol  oxalate (MOVANTIK ) 25 MG TABS tablet Take 1 tablet (25 mg total) by mouth daily.   NONFORMULARY OR COMPOUNDED ITEM Place 1 spray into the nose as needed (headache). 4% Lidocaine  nasal spray.   1 spray into nostril on affected headache side if needed for headache. May repeat 1 spray 10 minutes later if needed.   Omega-3 Fatty Acids (OMEGA III EPA+DHA) 1000 MG CAPS Take by mouth.   traMADol  (ULTRAM ) 50  MG tablet Take 100 mg by mouth 3 (three) times daily. 2x daily   valACYclovir  (VALTREX ) 500 MG tablet Take 500 mg by mouth 2 (two) times daily as needed (outbreaks).    Vitamin D, Cholecalciferol, 25 MCG (1000 UT) CAPS Take 1 capsule by mouth daily.   albuterol  (VENTOLIN  HFA) 108 (90 Base) MCG/ACT inhaler Inhale 2 puffs into the lungs every 6 (six) hours as needed for wheezing or shortness of breath. (Patient not taking: Reported on 02/21/2024)   montelukast  (SINGULAIR ) 10  MG tablet Take 1 tablet (10 mg total) by mouth at bedtime.   moxifloxacin  (AVELOX ) 400 MG tablet Take 1 tablet (400 mg total) by mouth daily. (Patient not taking: Reported on 02/21/2024)   Spacer/Aero-Holding Chambers (AEROCHAMBER MV) inhaler Use as instructed (Patient not taking: Reported on 02/21/2024)   timolol  (TIMOPTIC ) 0.5 % ophthalmic solution 1-2 drops eyes at onset and 30 minutes after if needed. Can repeat in 2 hours for headache/migraine. (Patient not taking: Reported on 02/21/2024)   No facility-administered encounter medications on file as of 02/21/2024.    Allergies as of 02/21/2024 - Review Complete 02/21/2024  Allergen Reaction Noted   Aspirin Other (See Comments), Nausea And Vomiting, and Nausea Only 04/18/2013   Ketorolac  Hives 08/31/2022   Ketorolac  tromethamine  Shortness Of Breath, Itching, and Other (See Comments) 08/14/2019   Penicillins Hives, Itching, Rash, and Other (See Comments) 03/19/1994   Codeine Other (See Comments) 07/17/2018   Ibuprofen Other (See Comments) and Nausea And Vomiting 09/03/2016   Latex Rash and Other (See Comments) 03/07/2011   Omeprazole magnesium Other (See Comments) 12/25/2019   Pantoprazole  sodium Other (See Comments) 12/25/2019   Penicillamine Rash 12/26/2019   Pregabalin Other (See Comments) 12/25/2019   Phenazopyridine   08/21/2023   Pyridium  [phenazopyridine  hcl] Swelling 06/02/2022    Past Medical History:  Diagnosis Date   ADHD    Arthritis 2010   Asthma    Back pain    Chronic headaches    Chronic pain    Constipation    Edema of both lower extremities    Family history of breast cancer    Family history of prostate cancer    Gallstones 2018   GERD (gastroesophageal reflux disease)    High cholesterol    History of kidney stones    Hx: UTI (urinary tract infection)    Hypertension    IBS (irritable bowel syndrome)    Interstitial cystitis 2012   Joint pain    Kidney problem    Migraine    Occipital neuralgia     Osteoarthritis    PNA (pneumonia) 2018   Posttraumatic muscle contracture (HCC)    Pre-diabetes    Spinal stenosis    Trigeminal neuralgia     Past Surgical History:  Procedure Laterality Date   ABDOMINAL HYSTERECTOMY     ANKLE SURGERY Left 05/2009   APPENDECTOMY     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 Dale Melia at [facility] [place]    BRAIN SURGERY     CARPAL TUNNEL RELEASE Right 2014   CHOLECYSTECTOMY N/A 12/24/2020   Procedure: LAPAROSCOPIC CHOLECYSTECTOMY;  Surgeon: Vernetta Berg, MD;  [facility];  Service: General;  Laterality: N/A;   COLONOSCOPY     CYSTOSCOPY W/ URETERAL STENT PLACEMENT Left 07/25/2019   Procedure: CYSTOSCOPY WITH RETROGRADE PYELOGRAM/URETERAL STENT PLACEMENT;  Surgeon: Matilda Senior, MD;  Location: [facility];  Service: Urology;  Laterality: Left;   CYSTOSCOPY W/ URETERAL STENT REMOVAL Left 08/15/2019  Procedure: CYSTOSCOPY WITH STENT REMOVAL;  Surgeon: Matilda Senior, MD;  Location: [facility];  Service: Urology;  Laterality: Left;   CYSTOSCOPY WITH RETROGRADE PYELOGRAM, URETEROSCOPY AND STENT PLACEMENT Left 08/15/2019   Procedure: CYSTOSCOPY WITH RETROGRADE PYELOGRAM, URETEROSCOPY;  Surgeon: Matilda Senior, MD;  Location: [facility];  Service: Urology;  Laterality: Left;  90 MINS   CYSTOSCOPY WITH RETROGRADE PYELOGRAM, URETEROSCOPY AND STENT PLACEMENT Right 09/12/2019   Procedure: CYSTOSCOPY WITH RIGHT RETROGRADE PYELOGRAM  URETEROSCOPY WITH HOLMIUM LASER STONE EXTRACTION AND STENT PLACEMENT;  Surgeon: Matilda Senior, MD;  Location: [facility];  Service: Urology;  Laterality: Right;   DILATION AND CURETTAGE OF UTERUS     HOLMIUM LASER APPLICATION Left 08/15/2019   Procedure: HOLMIUM LASER APPLICATION;  Surgeon: Matilda Senior, MD;  Location: [facility];  Service: Urology;  Laterality: Left;   JOINT REPLACEMENT     R knee   KIDNEY STONE SURGERY     neuro spine similator  01/2021   OVARIAN CYST REMOVAL  05/2010    right knee revision  2016   ROTATOR CUFF REPAIR Right 02/2019   SPINE SURGERY     x 3   TUBAL LIGATION      Family History  Problem Relation Age of Onset   Ulcers Mother 58       peptic ulcer   Irritable bowel syndrome Mother    High blood pressure Mother    Sudden death Mother    Depression Mother    Prostate cancer Father 38   Heart disease Father    Irritable bowel syndrome Father    High blood pressure Father    Breast cancer Sister 49       ER+/PR+/Her2- breast cancer   Alzheimer's disease Sister    Hypertension Sister    Kidney disease Sister    Celiac disease Sister    Thyroid  disease Sister    Breast cancer Paternal Grandmother    Diabetes Paternal Grandmother    Cancer Paternal Uncle        unknown cancers   Liver disease Neg Hx    Esophageal cancer Neg Hx    Colon cancer Neg Hx     Social History   Socioeconomic History   Marital status: Single    Spouse name: Not on file   Number of children: 1   Years of education: Not on file   Highest education level: Not on file  Occupational History   Occupation: retired   Occupation: Consulting civil engineer  Tobacco Use   Smoking status: Never   Smokeless tobacco: Never  Vaping Use   Vaping status: Never Used  Substance and Sexual Activity   Alcohol use: Yes    Comment: socially   Drug use: No   Sexual activity: Yes    Birth control/protection: Surgical  Other Topics Concern   Not on file  Social History Narrative   Not on file   Social Drivers of Health   Financial Resource Strain: Low Risk  (04/12/2023)   Received from [facility]   Overall Financial Resource Strain (CARDIA)    Difficulty of Paying Living Expenses: Not very hard  Food Insecurity: Not on File (05/04/2023)   Received from [facility] Insecurity    Food: 0  Transportation Needs: No Transportation Needs (04/12/2023)   Received [facility] - Transportation    Lack of Transportation (Medical): No    Lack of Transportation  (Non-Medical): No  Physical Activity: Insufficiently Active (04/12/2023)   Received from  [facility]   Exercise Vital Sign    On average, how many days per week do you engage in moderate to strenuous exercise (like a brisk walk)?: 2 days    On average, how many minutes do you engage in exercise at this level?: 20 min  Stress: No Stress Concern Present (04/12/2023)   Received from [facility] - Occupational Stress Questionnaire    Feeling of Stress : Only a little  Social Connections: Socially Integrated (04/12/2023)   Received from [facility]   Social Network    How would you rate your social network (family, work, friends)?: Good participation with social networks  Intimate Partner Violence: Not At Risk (04/12/2023)   Received from [facility]   HITS    Over the last 12 months how often did your partner physically hurt you?: Never    Over the last 12 months how often did your partner insult you or talk down to you?: Never    Over the last 12 months how often did your partner threaten you with physical harm?: Never    Over the last 12 months how often did your partner scream or curse at you?: Never      Review of systems: All other review of systems negative except as mentioned in the HPI.   Physical Exam: Vitals:   02/21/24 1017  BP: 118/86  Pulse: 72  SpO2: 97%   Body mass index is 33.13 kg/m. Gen:      No acute distress HEENT:  sclera anicteric Abd:      soft, non-tender; no palpable masses, no distension Ext:    No edema Neuro: alert and oriented x 3 Psych: normal mood and affect  Data Reviewed:  Reviewed labs, radiology imaging, old records and pertinent past GI work up      Assessment & Plan Irritable bowel syndrome Irritable bowel syndrome with intermittent abdominal pain, likely exacerbated by dietary choices and scar tissue from previous surgeries. Dicyclomine  provides relief when taken as needed. - Provide  refills for dicyclomine  10 mg every 8 as needed for as-needed use.  Constipation Chronic constipation, likely exacerbated by opioid use for pain management. Current regimen includes Linzess , lubiprostone , and Movantik . Incorporating dietary changes, such as green beans, has improved bowel movements. Lubiprostone  is to be taken twice daily. - Continue Movantik  once daily at bedtime. - Take lubiprostone  24 mcg twice daily. - Provide samples of Motegrity  2mg  for trial if current regimen is ineffective.  She is currently taking both lubiprostone  and Linzess ., advised her to only take lubiprostone  24 mcg twice daily and assess effectiveness.  Linzess  was not working for her any longer.  If lubiprostone  not effective, will send prescription for Motegrity  - Encourage dietary fiber intake of 20-30 grams per day and adequate hydration.  Status post cholecystectomy Status post cholecystectomy with persistent sensitivity and scar tissue in the area. Scar tissue may contribute to abdominal discomfort. - Advise gentle abdominal massage with diluted essential oils to aid bowel movement.  Gastroesophageal reflux disease Gastroesophageal reflux disease is well-managed with current treatment. No heartburn, regurgitation, or vomiting reported. Dexilant  is effective in managing symptoms. Continue Dexilant  and antireflux measures    This visit required 30 minutes of patient care (this includes precharting, chart review, review of results, face-to-face time used for counseling as well as treatment plan and follow-up. The patient was provided an opportunity to ask questions and all were answered. The patient agreed with the plan and  demonstrated an understanding of the instructions.   LOIS Wilkie Mcgee , MD    CC: Florie Cohen, MD

## 2024-02-22 MED ORDER — LUBIPROSTONE 24 MCG PO CAPS: 24.0000 ug | ORAL_CAPSULE | Freq: Two times a day (BID) | ORAL | 3 refills | Status: DC

## 2024-02-22 NOTE — Telephone Encounter (Signed)
 Sent in Amitiza  24 mcg in for twice daily this morning to patients pharmacy as requested, I was out of the office yesterday afternoon   Spoke with patient she is needing samples of Motegrity  2 mg, I will reach out to [place]  The prescription for Motegrity  for a #30 day supply is $280  Sent in Amitiza  for the patient

## 2024-02-26 DIAGNOSIS — M25561 Pain in right knee: Secondary | ICD-10-CM | POA: Diagnosis not present

## 2024-03-27 ENCOUNTER — Other Ambulatory Visit: Payer: Self-pay | Admitting: Pharmacy Technician

## 2024-03-27 ENCOUNTER — Encounter (INDEPENDENT_AMBULATORY_CARE_PROVIDER_SITE_OTHER): Payer: Self-pay

## 2024-03-27 ENCOUNTER — Other Ambulatory Visit: Payer: Self-pay

## 2024-03-27 NOTE — Progress Notes (Signed)
 Specialty Pharmacy Refill Coordination Note  Cassidy Olsen is a 63 y.o. female assessed today regarding refills of clinic administered specialty medication(s) OnabotulinumtoxinA  (Botox )   Clinic requested Courier to Provider Office   Delivery date: 04/02/24   Verified address: [facility] [address], [place], [place] [address]   Medication will be filled on 04/01/24.  Appt on 8/28 @ 11:30 am - $0.00 Copay

## 2024-03-28 ENCOUNTER — Other Ambulatory Visit: Payer: Self-pay

## 2024-03-28 DIAGNOSIS — E785 Hyperlipidemia, unspecified: Secondary | ICD-10-CM | POA: Insufficient documentation

## 2024-03-28 DIAGNOSIS — K760 Fatty (change of) liver, not elsewhere classified: Secondary | ICD-10-CM | POA: Diagnosis not present

## 2024-03-28 DIAGNOSIS — E8770 Fluid overload, unspecified: Secondary | ICD-10-CM | POA: Diagnosis not present

## 2024-03-28 DIAGNOSIS — J4489 Other specified chronic obstructive pulmonary disease: Secondary | ICD-10-CM | POA: Diagnosis not present

## 2024-03-28 DIAGNOSIS — I10 Essential (primary) hypertension: Secondary | ICD-10-CM | POA: Diagnosis not present

## 2024-03-28 DIAGNOSIS — R7303 Prediabetes: Secondary | ICD-10-CM | POA: Diagnosis not present

## 2024-04-01 ENCOUNTER — Other Ambulatory Visit: Payer: Self-pay
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
## 2024-04-04 ENCOUNTER — Ambulatory Visit (HOSPITAL_BASED_OUTPATIENT_CLINIC_OR_DEPARTMENT_OTHER): Attending: Orthopedic Surgery | Admitting: Physical Therapy

## 2024-04-04 ENCOUNTER — Encounter (HOSPITAL_BASED_OUTPATIENT_CLINIC_OR_DEPARTMENT_OTHER): Payer: Self-pay | Admitting: Physical Therapy

## 2024-04-04 ENCOUNTER — Other Ambulatory Visit: Payer: Self-pay

## 2024-04-04 ENCOUNTER — Ambulatory Visit (INDEPENDENT_AMBULATORY_CARE_PROVIDER_SITE_OTHER): Admitting: Family Medicine

## 2024-04-04 DIAGNOSIS — M6281 Muscle weakness (generalized): Secondary | ICD-10-CM | POA: Insufficient documentation

## 2024-04-04 DIAGNOSIS — G43709 Chronic migraine without aura, not intractable, without status migrainosus: Secondary | ICD-10-CM

## 2024-04-04 DIAGNOSIS — M5459 Other low back pain: Secondary | ICD-10-CM | POA: Diagnosis not present

## 2024-04-04 MED ORDER — ONABOTULINUMTOXINA 200 UNITS IJ SOLR
155.0000 [IU] | Freq: Once | INTRAMUSCULAR | Status: AC
  Administered 2024-04-04: 155 [IU] via INTRAMUSCULAR

## 2024-04-04 NOTE — Therapy (Signed)
OUTPATIENT PHYSICAL THERAPY THORACOLUMBAR EVALUATION   Patient Name: Cassidy Olsen MRN: [id number] DOB:01/19/1961, 63 y.o., female Today's Date: 04/04/2024  END OF SESSION:  PT End of Session - 04/04/24 1348     Visit Number 1    Number of Visits 5    Date for PT Re-Evaluation 05/17/24    Authorization Type mcr    PT Start Time 1315    PT Stop Time 1345    PT Time Calculation (min) 30 min    Activity Tolerance Patient tolerated treatment well    Behavior During Therapy [facility] for tasks assessed/performed          Past Medical History:  Diagnosis Date   ADHD    Arthritis 2010   Asthma    Back pain    Chronic headaches    Chronic pain    Constipation    Edema of both lower extremities    Family history of breast cancer    Family history of prostate cancer    Gallstones 2018   GERD (gastroesophageal reflux disease)    High cholesterol    History of kidney stones    Hx: UTI (urinary tract infection)    Hypertension    IBS (irritable bowel syndrome)    Interstitial cystitis 2012   Joint pain    Kidney problem    Migraine    Occipital neuralgia    Osteoarthritis    PNA (pneumonia) 2018   Posttraumatic muscle contracture (HCC)    Pre-diabetes    Spinal stenosis    Trigeminal neuralgia    Past Surgical History:  Procedure Laterality Date   ABDOMINAL HYSTERECTOMY     ANKLE SURGERY Left 05/2009   APPENDECTOMY     Benign Tumor Removal Right 01/26/2017   right upper arm by Dr. Dale Melia at [facility] [place]    BRAIN SURGERY     CARPAL TUNNEL RELEASE Right 2014   CHOLECYSTECTOMY N/A 12/24/2020   Procedure: LAPAROSCOPIC CHOLECYSTECTOMY;  Surgeon: Vernetta Berg, MD;  Location: [facility] OR;  Service: General;  Laterality: N/A;   COLONOSCOPY     CYSTOSCOPY W/ URETERAL STENT PLACEMENT Left 07/25/2019   Procedure: CYSTOSCOPY WITH RETROGRADE PYELOGRAM/URETERAL STENT PLACEMENT;  Surgeon: Matilda Senior, MD;  Location: [facility] ORS;  Service: Urology;  Laterality:  Left;   CYSTOSCOPY W/ URETERAL STENT REMOVAL Left 08/15/2019   Procedure: CYSTOSCOPY WITH STENT REMOVAL;  Surgeon: Matilda Senior, MD;  Location: [facility] OR[facility];  Service: Urology;  Laterality: Left;   CYSTOSCOPY WITH RETROGRADE PYELOGRAM, URETEROSCOPY AND STENT PLACEMENT Left 08/15/2019   Procedure: CYSTOSCOPY WITH RETROGRADE PYELOGRAM, URETEROSCOPY;  Surgeon: Matilda Senior, MD;  Location: [facility];  Service: Urology;  Laterality: Left;  90 MINS   CYSTOSCOPY WITH RETROGRADE PYELOGRAM, URETEROSCOPY AND STENT PLACEMENT Right 09/12/2019   Procedure: CYSTOSCOPY WITH RIGHT RETROGRADE PYELOGRAM  URETEROSCOPY WITH HOLMIUM LASER STONE EXTRACTION AND STENT PLACEMENT;  Surgeon: Matilda Senior, MD;  Location: [facility];  Service: Urology;  Laterality: Right;   DILATION AND CURETTAGE OF UTERUS     HOLMIUM LASER APPLICATION Left 08/15/2019   Procedure: HOLMIUM LASER APPLICATION;  Surgeon: Matilda Senior, MD;  Location: [facility];  Service: Urology;  Laterality: Left;   JOINT REPLACEMENT     R knee   KIDNEY STONE SURGERY     neuro spine similator  01/2021   OVARIAN CYST REMOVAL  05/2010   right knee revision  2016   ROTATOR CUFF REPAIR Right 02/2019   SPINE SURGERY     x 3   TUBAL  LIGATION     Patient Active Problem List   Diagnosis Date Noted   Dyslipidemia 03/28/2024   Arthralgia of right ankle 01/12/2023   Gastroesophageal reflux disease 12/18/2022   Spinal stenosis 12/18/2022   Obesity: Starting BMI 33.3 11/09/2022   BMI 30.0-30.9,adult Current BMI 30.9 11/09/2022   Dysphagia 09/22/2022   Hypertension, essential 07/26/2022   Class 1 obesity with serious comorbidity and body mass index (BMI) of 33.0 to 33.9 in adult 06/23/2022   Mood disorder (HCC)- with emotional eating 05/23/2022   Mixed hyperlipidemia 05/23/2022   Prediabetes 04/26/2022   Depression 04/26/2022   Allergic rhinitis 01/26/2022   Morbid obesity (HCC) 09/24/2021   Exertional chest pain 09/24/2021   S/P laparoscopic  cholecystectomy 12/24/2020   RUQ abdominal pain 12/25/2019   Decreased range of knee movement 09/02/2019   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   Irritable bowel syndrome with constipation 09/02/2019   Calculus, ureter 07/24/2019   Chronic migraine without aura, with intractable migraine, so stated, with status migrainosus 05/30/2019   Chronic asthmatic bronchitis (HCC) 05/14/2019   Excessive daytime sleepiness 01/08/2019   Upper airway cough syndrome 11/12/2018   Acute bacterial sinusitis 07/17/2018   Abnormal chest CT 07/17/2018   Trigeminal neuralgia of right side of face 02/01/2018   Occipital neuralgia 02/01/2018   Chronic migraine without aura without status migrainosus, not intractable 02/01/2018   Decreased strength 05/10/2017   Family history of breast cancer    Family history of prostate cancer    Chronic left shoulder pain 09/02/2015   Carpal tunnel syndrome 08/25/2011   Asthma 04/14/2009   Essential hypertension 04/14/2009   Hyperlipidemia 04/14/2009   DDD (degenerative disc disease), cervical 08/09/1999    PCP: Zada Rankin MD  REFERRING PROVIDER: Donaciano Sprang  REFERRING DIAG: M54.50 (ICD-10-CM) - Low back pain, unspecified   Rationale for Evaluation and Treatment: Rehabilitation  THERAPY DIAG:  Other low back pain  Muscle weakness (generalized)  ONSET DATE: chronic  SUBJECTIVE:                                                                                                                                                                                           SUBJECTIVE STATEMENT: LBP 10/10 every morning.  Pain goes through bilat calf. I go to bed icing and wake up icing.   Never was able to get membership to a pool to continue with aquatic therapy after DC last year.  I have been keeping up with a land based exercises program instructed recently from another clinic. Will be getting access to [facility] in next week.  Want to be instructed again on  exercises to  make sure I am doing them right  PERTINENT HISTORY:  2002 lumar micro discectomy  2007 C6-7 Fusion 2014 L5-S1 fusion   2015-2016 R knee TKA and revision   2022 lumbar nerve stimulator   Dr  Burnetta: Since her last evaluation she had bilateral SI joint injections and has noted significant improvement. Her insurance denied the intradiscal injection. Her clinical exam demonstrates positive sacroiliac joint pain with confirmatory provocative testing. 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 Yes: NPRS scale: current 8/10; worst 10/10; least 6/10 Pain location: LB buttock and through calves Pain description: ache burning; stabbing Aggravating factors: walking, lying in bed; activity Relieving factors: lidocaine  patches; rest; stretching; ice/heat  PRECAUTIONS: None  RED FLAGS: None   WEIGHT BEARING RESTRICTIONS: No  FALLS:  Has patient fallen in last 6 months? Yes. 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  DIAGNOSTIC FINDINGS:  IMPRESSION: 1. At L3-4 there is a broad-based disc bulge. Severe bilateral facet arthropathy. Moderate spinal stenosis. Bilateral subarticular recess stenosis. Moderate left foraminal stenosis. Moderate-severe right foraminal stenosis. 2. At L4-5 there is a broad-based disc bulge. Moderate bilateral facet arthropathy with bilateral mild facet effusions. Severe spinal stenosis. Severe right  foraminal stenosis. Mild-moderate left foraminal stenosis. 3. Posterior lumbar fusion at L5-S1 with a broad-based disc bulge. No spinal stenosis. Mild bilateral foraminal stenosis. 4. No acute osseous injury of the lumbar spine.       Fusion hardware noted at L5-S1 no abnormalities noted adjacent segment disease noted at L4-5 and L3-4.   PATIENT SURVEYS:  ODI 25/50=50%  COGNITION: Overall cognitive status: Within functional limits for tasks assessed     SENSATION: Numbness and tingling through le into mid calves   POSTURE: rounded shoulders and decreased lumbar lordosis   PALPATION: TTP of L lumbar paraspinals and L QL; hypertonicity of bilat lumbar paraspinals and QL  LUMBAR ROM:   AROM eval  Flexion 75% limited P!  Extension Unable to get to  neutral P!  Right lateral flexion 75 % limited P!  Left lateral flexion 75 % limited P!  Right rotation   Left rotation    (Blank rows = not tested)  LOWER EXTREMITY ROM:     wfl  LOWER EXTREMITY MMT:    HD (lb) Right eval Left eval  Hip flexion 29.6 32.8  Hip extension    Hip abduction 21.1 25.1  Hip adduction    Hip internal rotation    Hip external rotation    Knee flexion    Knee extension    Ankle dorsiflexion    Ankle plantarflexion    Ankle inversion    Ankle eversion     (Blank rows = not tested)  FUNCTIONAL TESTS:  TUG:18.86   4 stage balance: Passed 1&2&3.  SLS: GAIT: Distance walked: 500 ft Assistive device utilized: None Level of assistance: Complete Independence Comments: guarded  TREATMENT  Eval Self care:Posture and body mechanic instruction; optimal exercise regimen; safety;  past HEP. Continued completion of recent Hep (land).                                                                                                                                PATIENT EDUCATION:  Education details: Discussed eval findings, rehab rationale, aquatic program progression/POC and pools in area.  Patient is in agreement  Person educated: Patient Education method: Explanation Education comprehension: verbalized understanding  HOME EXERCISE PROGRAM: [id number]   ASSESSMENT:  CLINICAL IMPRESSION: Patient is a 63 y.o. f who was seen today for physical therapy evaluation and treatment for low back.   Pt is well known to this clinic and to PT having completed several episodes of care.  She was seen x 1 year ago for lengthy episode for similar Dx.  She presents today with acute on chronic back pain with SIJ involvement and pending surgical intervention. She was unable to gain pool access to continue with aquatic HEP as assigned last episode  ACTIVITY LIMITATIONS: carrying, lifting, sleeping, transfers, dressing, and bending, squatting   PARTICIPATION LIMITATIONS: cleaning, laundry, yard work, community activity, and exercise   PERSONAL FACTORS: Age, Fitness, Time since onset of injury/illness/exacerbation, and 3+ comorbidities:  are also affecting patient's functional outcome.    REHAB POTENTIAL: Fair: Chronic   CLINICAL DECISION MAKING: unstable/complicated  EVALUATION COMPLEXITY: Low   GOALS: Goals reviewed with patient? Yes  SHORT TERM GOALS: Target date: 04/17/24  Pt will tolerate full aquatic sessions consistently without increase in pain and with improving function to demonstrate good toleration and effectiveness of intervention.  Baseline: Goal status: INITIAL    LONG TERM GOALS: Target date: 05/17/24  Pt to improve on ODI by 13 % to demonstrate statistically significant Improvement in function. (MCID 13-15%) Baseline: 25/50=50% Goal status: INITIAL  2.  Pt will be indep with updated and final Aquatic HEP for continued management of condition Baseline:  Goal status: INITIAL  3.  Pt will report decrease in pain by at least 50% while submerged for improved toleration to activity to improved management of pain. Baseline:  Goal status: INITIAL  4.  Pt to gain  access to pool for continued ability to complete HEP Baseline: none Goal status: INITIAL   PLAN:  PT FREQUENCY: 1-2x/week  PT DURATION: 6 weeks extended due to scheduling conflicts  PLANNED INTERVENTIONS: [id number]- PT Re-evaluation, 97750- Physical Performance Testing, 97110-Therapeutic exercises, 97530- Therapeutic activity, 97112- Neuromuscular re-education, 97535- Self Care, 02859- Manual therapy, [id number]- Gait training, [phone]- Aquatic Therapy, [phone]- Electrical stimulation (manual), [id number]- Ionotophoresis 4mg /ml Dexamethasone , 79439 (1-2 muscles), 20561 (3+ muscles)- Dry Needling, Patient/Family education, Balance training, Stair training, Taping, Joint mobilization, DME instructions, Cryotherapy, and Moist heat.  PLAN FOR NEXT SESSION: aquatic: re-instruction on aquatic HEP-additions/modification as needed; core and LE stretching and strengthening; pain management   Ronal Kem) Avira Tillison MPT 04/04/24 1:51 PM [facility] [address] [place], [place], [address] Phone: [phone]   Fax:  [phone]   

## 2024-04-04 NOTE — Progress Notes (Signed)
 Botox -200U x 1vial Lot: [id number] Expiration: 06/2026 NDC: 9976-6078-97  Bacteriostatic 0.9% Sodium Chloride - 4mL total Lot: [id number] Expiration: 06/07/2025 NDC: 9590-8033-97  Specialty pharmacy  Witnessed by: Delon ORN, CMA Dx: [phone]

## 2024-04-11 ENCOUNTER — Encounter (HOSPITAL_BASED_OUTPATIENT_CLINIC_OR_DEPARTMENT_OTHER): Payer: Self-pay | Admitting: Physical Therapy

## 2024-04-11 ENCOUNTER — Ambulatory Visit (HOSPITAL_BASED_OUTPATIENT_CLINIC_OR_DEPARTMENT_OTHER): Attending: Orthopedic Surgery | Admitting: Physical Therapy

## 2024-04-11 DIAGNOSIS — M6281 Muscle weakness (generalized): Secondary | ICD-10-CM | POA: Diagnosis not present

## 2024-04-11 DIAGNOSIS — M545 Low back pain, unspecified: Secondary | ICD-10-CM | POA: Insufficient documentation

## 2024-04-11 DIAGNOSIS — M5459 Other low back pain: Secondary | ICD-10-CM | POA: Insufficient documentation

## 2024-04-11 DIAGNOSIS — R262 Difficulty in walking, not elsewhere classified: Secondary | ICD-10-CM | POA: Diagnosis not present

## 2024-04-11 NOTE — Therapy (Signed)
 OUTPATIENT PHYSICAL THERAPY THORACOLUMBAR TREATMENT   Patient Name: Cassidy Olsen MRN: [id number] DOB:May 17, 1961, 63 y.o., female Today's Date: 04/11/2024  END OF SESSION:  PT End of Session - 04/11/24 0848     Visit Number 2    Number of Visits 5    Date for PT Re-Evaluation 05/17/24    Authorization Type mcr    PT Start Time 0848    PT Stop Time 0928    PT Time Calculation (min) 40 min    Activity Tolerance Patient tolerated treatment well    Behavior During Therapy [facility] for tasks assessed/performed          Past Medical History:  Diagnosis Date   ADHD    Arthritis 2010   Asthma    Back pain    Chronic headaches    Chronic pain    Constipation    Edema of both lower extremities    Family history of breast cancer    Family history of prostate cancer    Gallstones 2018   GERD (gastroesophageal reflux disease)    High cholesterol    History of kidney stones    Hx: UTI (urinary tract infection)    Hypertension    IBS (irritable bowel syndrome)    Interstitial cystitis 2012   Joint pain    Kidney problem    Migraine    Occipital neuralgia    Osteoarthritis    PNA (pneumonia) 2018   Posttraumatic muscle contracture (HCC)    Pre-diabetes    Spinal stenosis    Trigeminal neuralgia    Past Surgical History:  Procedure Laterality Date   ABDOMINAL HYSTERECTOMY     ANKLE SURGERY Left 05/2009   APPENDECTOMY     Benign Tumor Removal Right 01/26/2017   right upper arm by Dr. Dale Melia at [facility] [place]    BRAIN SURGERY     CARPAL TUNNEL RELEASE Right 2014   CHOLECYSTECTOMY N/A 12/24/2020   Procedure: LAPAROSCOPIC CHOLECYSTECTOMY;  Surgeon: Vernetta Berg, MD;  Location: [facility] OR;  Service: General;  Laterality: N/A;   COLONOSCOPY     CYSTOSCOPY W/ URETERAL STENT PLACEMENT Left 07/25/2019   Procedure: CYSTOSCOPY WITH RETROGRADE PYELOGRAM/URETERAL STENT PLACEMENT;  Surgeon: Matilda Senior, MD;  Location: [facility] ORS;  Service: Urology;  Laterality: Left;    CYSTOSCOPY W/ URETERAL STENT REMOVAL Left 08/15/2019   Procedure: CYSTOSCOPY WITH STENT REMOVAL;  Surgeon: Matilda Senior, MD;  Location: [facility];  Service: Urology;  Laterality: Left;   CYSTOSCOPY WITH RETROGRADE PYELOGRAM, URETEROSCOPY AND STENT PLACEMENT Left 08/15/2019   Procedure: CYSTOSCOPY WITH RETROGRADE PYELOGRAM, URETEROSCOPY;  Surgeon: Matilda Senior, MD;  Location: [facility];  Service: Urology;  Laterality: Left;  90 MINS   CYSTOSCOPY WITH RETROGRADE PYELOGRAM, URETEROSCOPY AND STENT PLACEMENT Right 09/12/2019   Procedure: CYSTOSCOPY WITH RIGHT RETROGRADE PYELOGRAM  URETEROSCOPY WITH HOLMIUM LASER STONE EXTRACTION AND STENT PLACEMENT;  Surgeon: Matilda Senior, MD;  Location: [facility];  Service: Urology;  Laterality: Right;   DILATION AND CURETTAGE OF UTERUS     HOLMIUM LASER APPLICATION Left 08/15/2019   Procedure: HOLMIUM LASER APPLICATION;  Surgeon: Matilda Senior, MD;  Location: [facility];  Service: Urology;  Laterality: Left;   JOINT REPLACEMENT     R knee   KIDNEY STONE SURGERY     neuro spine similator  01/2021   OVARIAN CYST REMOVAL  05/2010   right knee revision  2016   ROTATOR CUFF REPAIR Right 02/2019   SPINE SURGERY     x 3   TUBAL  LIGATION     Patient Active Problem List   Diagnosis Date Noted   Dyslipidemia 03/28/2024   Arthralgia of right ankle 01/12/2023   Gastroesophageal reflux disease 12/18/2022   Spinal stenosis 12/18/2022   Obesity: Starting BMI 33.3 11/09/2022   BMI 30.0-30.9,adult Current BMI 30.9 11/09/2022   Dysphagia 09/22/2022   Hypertension, essential 07/26/2022   Class 1 obesity with serious comorbidity and body mass index (BMI) of 33.0 to 33.9 in adult 06/23/2022   Mood disorder (HCC)- with emotional eating 05/23/2022   Mixed hyperlipidemia 05/23/2022   Prediabetes 04/26/2022   Depression 04/26/2022   Allergic rhinitis 01/26/2022   Morbid obesity (HCC) 09/24/2021   Exertional chest pain 09/24/2021   S/P laparoscopic  cholecystectomy 12/24/2020   RUQ abdominal pain 12/25/2019   Decreased range of knee movement 09/02/2019   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   Irritable bowel syndrome with constipation 09/02/2019   Calculus, ureter 07/24/2019   Chronic migraine without aura, with intractable migraine, so stated, with status migrainosus 05/30/2019   Chronic asthmatic bronchitis (HCC) 05/14/2019   Excessive daytime sleepiness 01/08/2019   Upper airway cough syndrome 11/12/2018   Acute bacterial sinusitis 07/17/2018   Abnormal chest CT 07/17/2018   Trigeminal neuralgia of right side of face 02/01/2018   Occipital neuralgia 02/01/2018   Chronic migraine without aura without status migrainosus, not intractable 02/01/2018   Decreased strength 05/10/2017   Family history of breast cancer    Family history of prostate cancer    Chronic left shoulder pain 09/02/2015   Carpal tunnel syndrome 08/25/2011   Asthma 04/14/2009   Essential hypertension 04/14/2009   Hyperlipidemia 04/14/2009   DDD (degenerative disc disease), cervical 08/09/1999    PCP: Zada Rankin MD  REFERRING PROVIDER: Donaciano Sprang  REFERRING DIAG: M54.50 (ICD-10-CM) - Low back pain, unspecified   Rationale for Evaluation and Treatment: Rehabilitation  THERAPY DIAG:  Other low back pain  Muscle weakness (generalized)  Pain, lumbar region  ONSET DATE: chronic  SUBJECTIVE:                                                                                                                                                                                           SUBJECTIVE STATEMENT: Woke up at about 8/10 now about a 7/10 moving helps   Initial Subjective LBP 10/10 every morning.  Pain goes through bilat calf. I go to bed icing and wake up icing.   Never was able to get membership to a pool to continue with aquatic therapy after DC last year.  I have been keeping up with a land based exercises program instructed recently  from another  clinic. Will be getting access to [facility] in next week.  Want to be instructed again on exercises to make sure I am doing them right  PERTINENT HISTORY:  2002 lumar micro discectomy  2007 C6-7 Fusion 2014 L5-S1 fusion   2015-2016 R knee TKA and revision   2022 lumbar nerve stimulator   Dr  Burnetta: Since her last evaluation she had bilateral SI joint injections and has noted significant improvement. Her insurance denied the intradiscal injection. Her clinical exam demonstrates positive sacroiliac joint pain with confirmatory provocative testing. 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 Yes: NPRS scale: current 8/10; worst 10/10; least 6/10 Pain location: LB buttock and through calves Pain description: ache burning; stabbing Aggravating factors: walking, lying in bed; activity Relieving factors: lidocaine  patches; rest; stretching; ice/heat  PRECAUTIONS: None  RED FLAGS: None   WEIGHT BEARING RESTRICTIONS: No  FALLS:  Has patient fallen in last 6 months? Yes. 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  DIAGNOSTIC FINDINGS:  IMPRESSION: 1. At L3-4 there is a broad-based disc bulge. Severe bilateral facet arthropathy. Moderate spinal stenosis. Bilateral subarticular recess stenosis. Moderate left foraminal stenosis. Moderate-severe right foraminal stenosis. 2. At L4-5 there is a broad-based disc bulge. Moderate  bilateral facet arthropathy with bilateral mild facet effusions. Severe spinal stenosis. Severe right foraminal stenosis. Mild-moderate left foraminal stenosis. 3. Posterior lumbar fusion at L5-S1 with a broad-based disc bulge. No spinal stenosis. Mild bilateral foraminal stenosis. 4. No acute osseous injury of the lumbar spine.       Fusion hardware noted at L5-S1 no abnormalities noted adjacent segment disease noted at L4-5 and L3-4.   PATIENT SURVEYS:  ODI 25/50=50%  COGNITION: Overall cognitive status: Within functional limits for tasks assessed     SENSATION: Numbness and tingling through le into mid calves   POSTURE: rounded shoulders and decreased lumbar lordosis   PALPATION: TTP of L lumbar paraspinals and L QL; hypertonicity of bilat lumbar paraspinals and QL  LUMBAR ROM:   AROM eval  Flexion 75% limited P!  Extension Unable to get to  neutral P!  Right lateral flexion 75 % limited P!  Left lateral flexion 75 % limited P!  Right rotation   Left rotation    (Blank rows = not tested)  LOWER EXTREMITY ROM:     wfl  LOWER EXTREMITY MMT:    HD (lb) Right eval Left eval  Hip flexion 29.6 32.8  Hip extension    Hip abduction 21.1 25.1  Hip adduction    Hip internal rotation    Hip external rotation    Knee flexion    Knee extension    Ankle dorsiflexion    Ankle plantarflexion    Ankle inversion    Ankle eversion     (Blank rows = not tested)  FUNCTIONAL TESTS:  TUG:18.86   4 stage balance: Passed 1&2&3.  SLS: GAIT: Distance walked: 500 ft Assistive device utilized: None Level of  assistance: Complete Independence Comments: guarded  TREATMENT  [facility] Adult PT Treatment:                                                DATE: 04/11/24 Pt seen for aquatic therapy today.  Treatment took place in water  3.5-4.75 ft in depth at the [facility] pool. Temp of water  was 91.  Pt entered/exited the pool via stairs using alternating pattern with hand  rail.    Exercises -Walking forward and back unsupported - Hand Buoy Carry   - Side stepping then lunge with yellow hand buoys ue add/abd - Standing 'L' Stretch at [employer]   - Seated Straddle on [employer] Breast Stroke Arms and Bicycle Legs ->ue support corner wall: hip add/abd; flex/ext -decompression position noodle wrapped posteriorly across chest -Hamstring stretch at steps   Pt requires the buoyancy and hydrostatic pressure of water  for support, and to offload joints by unweighting joint load by at least 50 % in navel deep water  and by at least 75-80% in chest to neck deep water .  Viscosity of the water  is needed for resistance of strengthening. Water  current perturbations provides challenge to standing balance requiring increased core activation.                                                                                                                                  PATIENT EDUCATION:  Education details: Discussed eval findings, rehab rationale, aquatic program progression/POC and pools in area. Patient is in agreement  Person educated: Patient Education method: Explanation Education comprehension: verbalized understanding  HOME EXERCISE PROGRAM: [id number]   ASSESSMENT:  CLINICAL IMPRESSION: Pt demonstrates safety and independence in aquatic setting with therapist instructing from deck. She is confident in setting, moving throughout all depths easily.  She arrives with original laminated program issued last episode. Instruction initiated through program with modification written where approp.  Her pain sensitivity fairly high initially so time spent on activities to reduce (gentle movement patterns in standing and sitting) with very good toleration. She does not tolerate but ~ 1/2 of program adding decompression and HS stretches. Small reduction in right sided SIJ pain upon conclusion of session by 1-2 NPRS. She is planning on surgery in late fall. Plan on  continuing instruction of HEP as tolerated adding/modifying as approp. Goals are ongoing.    Initial Impression Patient is a 63 y.o. f who was seen today for physical therapy evaluation and treatment for low back.   Pt is well known to this clinic and to PT having completed several episodes of care.  She was seen x 1 year ago for lengthy episode for similar Dx.  She presents today with acute on chronic back pain with SIJ involvement and pending surgical intervention. She was unable to gain pool access  to continue with aquatic HEP as assigned last episode  ACTIVITY LIMITATIONS: carrying, lifting, sleeping, transfers, dressing, and bending, squatting   PARTICIPATION LIMITATIONS: cleaning, laundry, yard work, community activity, and exercise   PERSONAL FACTORS: Age, Fitness, Time since onset of injury/illness/exacerbation, and 3+ comorbidities:  are also affecting patient's functional outcome.    REHAB POTENTIAL: Fair: Chronic   CLINICAL DECISION MAKING: unstable/complicated  EVALUATION COMPLEXITY: Low   GOALS: Goals reviewed with patient? Yes  SHORT TERM GOALS: Target date: 04/17/24  Pt will tolerate full aquatic sessions consistently without increase in pain and with improving function to demonstrate good toleration and effectiveness of intervention.  Baseline: Goal status: INITIAL    LONG TERM GOALS: Target date: 05/17/24  Pt to improve on ODI by 13 % to demonstrate statistically significant Improvement in function. (MCID 13-15%) Baseline: 25/50=50% Goal status: INITIAL  2.  Pt will be indep with updated and final Aquatic HEP for continued management of condition Baseline:  Goal status: INITIAL  3.  Pt will report decrease in pain by at least 50% while submerged for improved toleration to activity to improved management of pain. Baseline:  Goal status: INITIAL  4.  Pt to gain access to pool for continued ability to complete HEP Baseline: none Goal status:  INITIAL   PLAN:  PT FREQUENCY: 1-2x/week  PT DURATION: 6 weeks extended due to scheduling conflicts  PLANNED INTERVENTIONS: [id number]- PT Re-evaluation, 97750- Physical Performance Testing, 97110-Therapeutic exercises, 97530- Therapeutic activity, 97112- Neuromuscular re-education, 97535- Self Care, [id number]- Manual therapy, [id number]- Gait training, [phone]- Aquatic Therapy, [phone]- Electrical stimulation (manual), [id number]- Ionotophoresis 4mg /ml Dexamethasone , 79439 (1-2 muscles), 20561 (3+ muscles)- Dry Needling, Patient/Family education, Balance training, Stair training, Taping, Joint mobilization, DME instructions, Cryotherapy, and Moist heat.  PLAN FOR NEXT SESSION: aquatic: re-instruction on aquatic HEP-additions/modification as needed; core and LE stretching and strengthening; pain management   Ronal Foots) Roshaunda Starkey MPT 04/11/24 8:50 AM [facility] [address] [place], [place], [address] Phone: [phone]   Fax:  [phone]

## 2024-04-12 DIAGNOSIS — M47817 Spondylosis without myelopathy or radiculopathy, lumbosacral region: Secondary | ICD-10-CM | POA: Diagnosis not present

## 2024-04-17 ENCOUNTER — Ambulatory Visit (HOSPITAL_BASED_OUTPATIENT_CLINIC_OR_DEPARTMENT_OTHER): Admitting: Physical Therapy

## 2024-04-17 ENCOUNTER — Encounter (HOSPITAL_BASED_OUTPATIENT_CLINIC_OR_DEPARTMENT_OTHER): Payer: Self-pay | Admitting: Physical Therapy

## 2024-04-17 DIAGNOSIS — M5459 Other low back pain: Secondary | ICD-10-CM

## 2024-04-17 DIAGNOSIS — M545 Low back pain, unspecified: Secondary | ICD-10-CM | POA: Diagnosis not present

## 2024-04-17 DIAGNOSIS — R262 Difficulty in walking, not elsewhere classified: Secondary | ICD-10-CM

## 2024-04-17 DIAGNOSIS — M6281 Muscle weakness (generalized): Secondary | ICD-10-CM

## 2024-04-17 NOTE — Therapy (Signed)
 OUTPATIENT PHYSICAL THERAPY THORACOLUMBAR TREATMENT   Patient Name: Cassidy Olsen MRN: [id number] DOB:05/07/1961, 63 y.o., female Today's Date: 04/17/2024  END OF SESSION:  PT End of Session - 04/17/24 1644     Visit Number 3    Number of Visits 5    Date for PT Re-Evaluation 05/17/24    Authorization Type mcr    PT Start Time 1643    PT Stop Time 1722    PT Time Calculation (min) 39 min    Activity Tolerance Patient tolerated treatment well    Behavior During Therapy WFL for tasks assessed/performed          Past Medical History:  Diagnosis Date   ADHD    Arthritis 2010   Asthma    Back pain    Chronic headaches    Chronic pain    Constipation    Edema of both lower extremities    Family history of breast cancer    Family history of prostate cancer    Gallstones 2018   GERD (gastroesophageal reflux disease)    High cholesterol    History of kidney stones    Hx: UTI (urinary tract infection)    Hypertension    IBS (irritable bowel syndrome)    Interstitial cystitis 2012   Joint pain    Kidney problem    Migraine    Occipital neuralgia    Osteoarthritis    PNA (pneumonia) 2018   Posttraumatic muscle contracture (HCC)    Pre-diabetes    Spinal stenosis    Trigeminal neuralgia    Past Surgical History:  Procedure Laterality Date   ABDOMINAL HYSTERECTOMY     ANKLE SURGERY Left 05/2009   APPENDECTOMY     Benign Tumor Removal Right 01/26/2017   right upper arm by Dr. Dale Melia at [facility] [place]    BRAIN SURGERY     CARPAL TUNNEL RELEASE Right 2014   CHOLECYSTECTOMY N/A 12/24/2020   Procedure: LAPAROSCOPIC CHOLECYSTECTOMY;  Surgeon: Vernetta Berg, MD;  Location: [facility] OR;  Service: General;  Laterality: N/A;   COLONOSCOPY     CYSTOSCOPY W/ URETERAL STENT PLACEMENT Left 07/25/2019   Procedure: CYSTOSCOPY WITH RETROGRADE PYELOGRAM/URETERAL STENT PLACEMENT;  Surgeon: Matilda Senior, MD;  Location: [facility];  Service: Urology;  Laterality:  Left;   CYSTOSCOPY W/ URETERAL STENT REMOVAL Left 08/15/2019   Procedure: CYSTOSCOPY WITH STENT REMOVAL;  Surgeon: Matilda Senior, MD;  Location: [facility];  Service: Urology;  Laterality: Left;   CYSTOSCOPY WITH RETROGRADE PYELOGRAM, URETEROSCOPY AND STENT PLACEMENT Left 08/15/2019   Procedure: CYSTOSCOPY WITH RETROGRADE PYELOGRAM, URETEROSCOPY;  Surgeon: Matilda Senior, MD;  Location: [facility];  Service: Urology;  Laterality: Left;  90 MINS   CYSTOSCOPY WITH RETROGRADE PYELOGRAM, URETEROSCOPY AND STENT PLACEMENT Right 09/12/2019   Procedure: CYSTOSCOPY WITH RIGHT RETROGRADE PYELOGRAM  URETEROSCOPY WITH HOLMIUM LASER STONE EXTRACTION AND STENT PLACEMENT;  Surgeon: Matilda Senior, MD;  Location: [facility];  Service: Urology;  Laterality: Right;   DILATION AND CURETTAGE OF UTERUS     HOLMIUM LASER APPLICATION Left 08/15/2019   Procedure: HOLMIUM LASER APPLICATION;  Surgeon: Matilda Senior, MD;  Location: [facility];  Service: Urology;  Laterality: Left;   JOINT REPLACEMENT     R knee   KIDNEY STONE SURGERY     neuro spine similator  01/2021   OVARIAN CYST REMOVAL  05/2010   right knee revision  2016   ROTATOR CUFF REPAIR Right 02/2019   SPINE SURGERY     x 3   TUBAL  LIGATION     Patient Active Problem List   Diagnosis Date Noted   Dyslipidemia 03/28/2024   Arthralgia of right ankle 01/12/2023   Gastroesophageal reflux disease 12/18/2022   Spinal stenosis 12/18/2022   Obesity: Starting BMI 33.3 11/09/2022   BMI 30.0-30.9,adult Current BMI 30.9 11/09/2022   Dysphagia 09/22/2022   Hypertension, essential 07/26/2022   Class 1 obesity with serious comorbidity and body mass index (BMI) of 33.0 to 33.9 in adult 06/23/2022   Mood disorder (HCC)- with emotional eating 05/23/2022   Mixed hyperlipidemia 05/23/2022   Prediabetes 04/26/2022   Depression 04/26/2022   Allergic rhinitis 01/26/2022   Morbid obesity (HCC) 09/24/2021   Exertional chest pain 09/24/2021   S/P laparoscopic  cholecystectomy 12/24/2020   RUQ abdominal pain 12/25/2019   Decreased range of knee movement 09/02/2019   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   Irritable bowel syndrome with constipation 09/02/2019   Calculus, ureter 07/24/2019   Chronic migraine without aura, with intractable migraine, so stated, with status migrainosus 05/30/2019   Chronic asthmatic bronchitis (HCC) 05/14/2019   Excessive daytime sleepiness 01/08/2019   Upper airway cough syndrome 11/12/2018   Acute bacterial sinusitis 07/17/2018   Abnormal chest CT 07/17/2018   Trigeminal neuralgia of right side of face 02/01/2018   Occipital neuralgia 02/01/2018   Chronic migraine without aura without status migrainosus, not intractable 02/01/2018   Decreased strength 05/10/2017   Family history of breast cancer    Family history of prostate cancer    Chronic left shoulder pain 09/02/2015   Carpal tunnel syndrome 08/25/2011   Asthma 04/14/2009   Essential hypertension 04/14/2009   Hyperlipidemia 04/14/2009   DDD (degenerative disc disease), cervical 08/09/1999    PCP: Zada Rankin MD  REFERRING PROVIDER: Donaciano Sprang  REFERRING DIAG: M54.50 (ICD-10-CM) - Low back pain, unspecified   Rationale for Evaluation and Treatment: Rehabilitation  THERAPY DIAG:  Other low back pain  Muscle weakness (generalized)  Pain, lumbar region  Difficulty walking  ONSET DATE: chronic  SUBJECTIVE:                                                                                                                                                                                           SUBJECTIVE STATEMENT: Had L3-4 ablation without the usual relief I get.  Think most pain is coming from SIJ left SIJ 7/10; general pain 5/10   Initial Subjective LBP 10/10 every morning.  Pain goes through bilat calf. I go to bed icing and wake up icing.   Never was able to get membership to a pool to continue with aquatic therapy after DC  last year.  I have been keeping up with a land based exercises program instructed recently from another clinic. Will be getting access to [facility] in next week.  Want to be instructed again on exercises to make sure I am doing them right  PERTINENT HISTORY:  2002 lumar micro discectomy  2007 C6-7 Fusion 2014 L5-S1 fusion   2015-2016 R knee TKA and revision   2022 lumbar nerve stimulator   Dr  Burnetta: Since her last evaluation she had bilateral SI joint injections and has noted significant improvement. Her insurance denied the intradiscal injection. Her clinical exam demonstrates positive sacroiliac joint pain with confirmatory provocative testing. 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 Yes: NPRS scale: current 8/10; worst 10/10; least 6/10 Pain location: LB buttock and through calves Pain description: ache burning; stabbing Aggravating factors: walking, lying in bed; activity Relieving factors: lidocaine  patches; rest; stretching; ice/heat  PRECAUTIONS: None  RED FLAGS: None   WEIGHT BEARING RESTRICTIONS: No  FALLS:  Has patient fallen in last 6 months? Yes. 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  DIAGNOSTIC FINDINGS:  IMPRESSION: 1. At L3-4 there is a broad-based disc bulge. Severe bilateral facet arthropathy. Moderate spinal stenosis. Bilateral subarticular recess stenosis. Moderate left foraminal stenosis. Moderate-severe  right foraminal stenosis. 2. At L4-5 there is a broad-based disc bulge. Moderate bilateral facet arthropathy with bilateral mild facet effusions. Severe spinal stenosis. Severe right foraminal stenosis. Mild-moderate left foraminal stenosis. 3. Posterior lumbar fusion at L5-S1 with a broad-based disc bulge. No spinal stenosis. Mild bilateral foraminal stenosis. 4. No acute osseous injury of the lumbar spine.       Fusion hardware noted at L5-S1 no abnormalities noted adjacent segment disease noted at L4-5 and L3-4.   PATIENT SURVEYS:  ODI 25/50=50%  COGNITION: Overall cognitive status: Within functional limits for tasks assessed     SENSATION: Numbness and tingling through le into mid calves   POSTURE: rounded shoulders and decreased lumbar lordosis   PALPATION: TTP of L lumbar paraspinals and L QL; hypertonicity of bilat lumbar paraspinals and QL  LUMBAR ROM:   AROM eval  Flexion 75% limited P!  Extension Unable to get to  neutral P!  Right lateral flexion 75 % limited P!  Left lateral flexion 75 % limited P!  Right rotation   Left rotation    (Blank rows = not tested)  LOWER EXTREMITY ROM:     wfl  LOWER EXTREMITY MMT:    HD (lb) Right eval Left eval  Hip flexion 29.6 32.8  Hip extension    Hip abduction 21.1 25.1  Hip adduction    Hip internal rotation    Hip external rotation    Knee flexion    Knee extension    Ankle dorsiflexion    Ankle plantarflexion    Ankle inversion    Ankle eversion     (Blank rows = not tested)  FUNCTIONAL TESTS:  TUG:18.86   4 stage balance:  Passed 1&2&3.  SLS: GAIT: Distance walked: 500 ft Assistive device utilized: None Level of assistance: Complete Independence Comments: guarded  TREATMENT  [facility] Adult PT Treatment:                                                DATE: 04/17/24 Pt seen for aquatic therapy today.  Treatment took place in water  3.5-4.75 ft in depth at the [facility] pool. Temp of water   was 91.  Pt entered/exited the pool via stairs using alternating pattern with hand rail.    Exercises -Walking forward and back unsupported -seated swing like on noodle: PPT; hip hiking; rotation cw&ccw -Solid noodle pull down for TrA wide stance then staggered (not tolerated well stabilizing with left, reduced step length improved toleration) x 10 - Standing 'L' Stretch at [employer]   - Hand Buoy Carry using yellow HB carrying bilaterally then unilaterally forward and back -Plank on bench with leg extension x 5. (Slight discomfort right SI area) -Plank on yellow HB holding x 20 s -plank with arm raises -decompression position noodle wrapped posteriorly across chest; cycling   Pt requires the buoyancy and hydrostatic pressure of water  for support, and to offload joints by unweighting joint load by at least 50 % in navel deep water  and by at least 75-80% in chest to neck deep water .  Viscosity of the water  is needed for resistance of strengthening. Water  current perturbations provides challenge to standing balance requiring increased core activation.                                                                                                                                  PATIENT EDUCATION:  Education details: Discussed eval findings, rehab rationale, aquatic program progression/POC and pools in area. Patient is in agreement  Person educated: Patient Education method: Explanation Education comprehension: verbalized understanding  HOME EXERCISE PROGRAM: [id number]   ASSESSMENT:  CLINICAL IMPRESSION: Pt had bilateral L2, L3, L4 medial branch and dorsal ramus of L5 radiofrequency ablation x 5 days ago with about 25 % pain relief. Continued through program completing majority of 2nd half. Instructions provided for mindfulness of movements to avoid over stretching/engagement. She does report minor discomfort during 2 exercises which reduces with positional changes and stretching.   Re-introduced planks with good challenge.  Goals ongoing.    Initial Impression Patient is a 63 y.o. f who was seen today for physical therapy evaluation and treatment for low back.   Pt is well known to this clinic and to PT having completed several episodes of care.  She was seen x 1 year ago for lengthy episode for similar Dx.  She presents today with acute on chronic back pain with SIJ involvement and pending surgical intervention. She was unable to gain pool access to continue  with aquatic HEP as assigned last episode  ACTIVITY LIMITATIONS: carrying, lifting, sleeping, transfers, dressing, and bending, squatting   PARTICIPATION LIMITATIONS: cleaning, laundry, yard work, community activity, and exercise   PERSONAL FACTORS: Age, Fitness, Time since onset of injury/illness/exacerbation, and 3+ comorbidities:  are also affecting patient's functional outcome.    REHAB POTENTIAL: Fair: Chronic   CLINICAL DECISION MAKING: unstable/complicated  EVALUATION COMPLEXITY: Low   GOALS: Goals reviewed with patient? Yes  SHORT TERM GOALS: Target date: 04/17/24  Pt will tolerate full aquatic sessions consistently without increase in pain and with improving function to demonstrate good toleration and effectiveness of intervention.  Baseline: Goal status:Met 04/17/24    LONG TERM GOALS: Target date: 05/17/24  Pt to improve on ODI by 13 % to demonstrate statistically significant Improvement in function. (MCID 13-15%) Baseline: 25/50=50% Goal status: INITIAL  2.  Pt will be indep with updated and final Aquatic HEP for continued management of condition Baseline:  Goal status: INITIAL  3.  Pt will report decrease in pain by at least 50% while submerged for improved toleration to activity to improved management of pain. Baseline:  Goal status: INITIAL  4.  Pt to gain access to pool for continued ability to complete HEP Baseline: none Goal status: INITIAL   PLAN:  PT FREQUENCY:  1-2x/week  PT DURATION: 6 weeks extended due to scheduling conflicts  PLANNED INTERVENTIONS: 97164- PT Re-evaluation, 97750- Physical Performance Testing, 97110-Therapeutic exercises, 97530- Therapeutic activity, 97112- Neuromuscular re-education, 97535- Self Care, [id number]- Manual therapy, [id number]- Gait training, [phone]- Aquatic Therapy, [phone]- Electrical stimulation (manual), [id number]- Ionotophoresis 4mg /ml Dexamethasone , 79439 (1-2 muscles), 20561 (3+ muscles)- Dry Needling, Patient/Family education, Balance training, Stair training, Taping, Joint mobilization, DME instructions, Cryotherapy, and Moist heat.  PLAN FOR NEXT SESSION: aquatic: re-instruction on aquatic HEP-additions/modification as needed; core and LE stretching and strengthening; pain management   Ronal Foots) Yulonda Wheeling MPT 04/17/24 4:53 PM [facility] [address] [place], [place], [address] Phone: [phone]   Fax:  [phone]

## 2024-04-17 NOTE — Therapy (Unsigned)
 Item Test date: ***  Sitting to standing {berg sit to stand:32780}  2. Standing unsupported {berg stand unsupported:32781}  3. Sitting with back unsupported, feet supported {berg sit unsupported:32782}  4. Standing to sitting {berg stand to sit:32783}  5. Pivot transfer  {berg transfers:32784}  6. Standing unsupported with eyes closed {berg stand unsupported EC:32785}  7. Standing unsupported with feet together {berg stand unsupported feet together:32786}  8. Reaching forward with outstretched arms while standing {berg reaching:32787}  9. Pick up object from the floor from standing {berg object from floor:32788}  10. Turning to look behind over left and right shoulders while standing {bern turn to look:32789}  11. Turn 360 degrees {berg turn 360:32790}  12. Place alternate foot on step or stool while standing unsupported {berg alternating foot:32791}  13. Standing unsupported one foot in front {berg tandem:32792}  14. Standing on one leg {berg SLS:32793}   Total Score ***/56

## 2024-04-18 ENCOUNTER — Ambulatory Visit (HOSPITAL_BASED_OUTPATIENT_CLINIC_OR_DEPARTMENT_OTHER): Payer: Self-pay | Admitting: Physical Therapy

## 2024-04-19 ENCOUNTER — Encounter (HOSPITAL_BASED_OUTPATIENT_CLINIC_OR_DEPARTMENT_OTHER): Payer: Self-pay | Admitting: Physical Therapy

## 2024-04-19 ENCOUNTER — Ambulatory Visit (HOSPITAL_BASED_OUTPATIENT_CLINIC_OR_DEPARTMENT_OTHER): Admitting: Physical Therapy

## 2024-04-19 DIAGNOSIS — M545 Low back pain, unspecified: Secondary | ICD-10-CM

## 2024-04-19 DIAGNOSIS — M5459 Other low back pain: Secondary | ICD-10-CM

## 2024-04-19 DIAGNOSIS — R262 Difficulty in walking, not elsewhere classified: Secondary | ICD-10-CM | POA: Diagnosis not present

## 2024-04-19 DIAGNOSIS — M6281 Muscle weakness (generalized): Secondary | ICD-10-CM | POA: Diagnosis not present

## 2024-04-19 NOTE — Therapy (Signed)
 OUTPATIENT PHYSICAL THERAPY THORACOLUMBAR TREATMENT   Patient Name: Cassidy Olsen MRN: [id number] DOB:04/26/1961, 63 y.o., female Today's Date: 04/19/2024  END OF SESSION:  PT End of Session - 04/19/24 0825     Visit Number 4    Number of Visits 5    Date for PT Re-Evaluation 05/17/24    Authorization Type Medicare    PT Start Time 0800    PT Stop Time 0840    PT Time Calculation (min) 40 min    Activity Tolerance Patient tolerated treatment well    Behavior During Therapy [facility] for tasks assessed/performed          Past Medical History:  Diagnosis Date   ADHD    Arthritis 2010   Asthma    Back pain    Chronic headaches    Chronic pain    Constipation    Edema of both lower extremities    Family history of breast cancer    Family history of prostate cancer    Gallstones 2018   GERD (gastroesophageal reflux disease)    High cholesterol    History of kidney stones    Hx: UTI (urinary tract infection)    Hypertension    IBS (irritable bowel syndrome)    Interstitial cystitis 2012   Joint pain    Kidney problem    Migraine    Occipital neuralgia    Osteoarthritis    PNA (pneumonia) 2018   Posttraumatic muscle contracture (HCC)    Pre-diabetes    Spinal stenosis    Trigeminal neuralgia    Past Surgical History:  Procedure Laterality Date   ABDOMINAL HYSTERECTOMY     ANKLE SURGERY Left 05/2009   APPENDECTOMY     Benign Tumor Removal Right 01/26/2017   right upper arm by Dr. Dale Melia at [facility]    BRAIN SURGERY     CARPAL TUNNEL RELEASE Right 2014   CHOLECYSTECTOMY N/A 12/24/2020   Procedure: LAPAROSCOPIC CHOLECYSTECTOMY;  Surgeon: Vernetta Berg, MD;  Location: [facility] OR;  Service: General;  Laterality: N/A;   COLONOSCOPY     CYSTOSCOPY W/ URETERAL STENT PLACEMENT Left 07/25/2019   Procedure: CYSTOSCOPY WITH RETROGRADE PYELOGRAM/URETERAL STENT PLACEMENT;  Surgeon: Matilda Senior, MD;  Location: [facility] ORS;  Service: Urology;  Laterality:  Left;   CYSTOSCOPY W/ URETERAL STENT REMOVAL Left 08/15/2019   Procedure: CYSTOSCOPY WITH STENT REMOVAL;  Surgeon: Matilda Senior, MD;  Location: [facility] ORS;  Service: Urology;  Laterality: Left;   CYSTOSCOPY WITH RETROGRADE PYELOGRAM, URETEROSCOPY AND STENT PLACEMENT Left 08/15/2019   Procedure: CYSTOSCOPY WITH RETROGRADE PYELOGRAM, URETEROSCOPY;  Surgeon: Matilda Senior, MD;  Location: [facility] OR[facility];  Service: Urology;  Laterality: Left;  90 MINS   CYSTOSCOPY WITH RETROGRADE PYELOGRAM, URETEROSCOPY AND STENT PLACEMENT Right 09/12/2019   Procedure: CYSTOSCOPY WITH RIGHT RETROGRADE PYELOGRAM  URETEROSCOPY WITH HOLMIUM LASER STONE EXTRACTION AND STENT PLACEMENT;  Surgeon: Matilda Senior, MD;  Location: [facility] ORS;  Service: Urology;  Laterality: Right;   DILATION AND CURETTAGE OF UTERUS     HOLMIUM LASER APPLICATION Left 08/15/2019   Procedure: HOLMIUM LASER APPLICATION;  Surgeon: Matilda Senior, MD;  Location: [facility] OR[facility];  Service: Urology;  Laterality: Left;   JOINT REPLACEMENT     R knee   KIDNEY STONE SURGERY     neuro spine similator  01/2021   OVARIAN CYST REMOVAL  05/2010   right knee revision  2016   ROTATOR CUFF REPAIR Right 02/2019   SPINE SURGERY     x 3   TUBAL  LIGATION     Patient Active Problem List   Diagnosis Date Noted   Dyslipidemia 03/28/2024   Arthralgia of right ankle 01/12/2023   Gastroesophageal reflux disease 12/18/2022   Spinal stenosis 12/18/2022   Obesity: Starting BMI 33.3 11/09/2022   BMI 30.0-30.9,adult Current BMI 30.9 11/09/2022   Dysphagia 09/22/2022   Hypertension, essential 07/26/2022   Class 1 obesity with serious comorbidity and body mass index (BMI) of 33.0 to 33.9 in adult 06/23/2022   Mood disorder (HCC)- with emotional eating 05/23/2022   Mixed hyperlipidemia 05/23/2022   Prediabetes 04/26/2022   Depression 04/26/2022   Allergic rhinitis 01/26/2022   Morbid obesity (HCC) 09/24/2021   Exertional chest pain 09/24/2021   S/P laparoscopic  cholecystectomy 12/24/2020   RUQ abdominal pain 12/25/2019   Decreased range of knee movement 09/02/2019   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   Irritable bowel syndrome with constipation 09/02/2019   Calculus, ureter 07/24/2019   Chronic migraine without aura, with intractable migraine, so stated, with status migrainosus 05/30/2019   Chronic asthmatic bronchitis (HCC) 05/14/2019   Excessive daytime sleepiness 01/08/2019   Upper airway cough syndrome 11/12/2018   Acute bacterial sinusitis 07/17/2018   Abnormal chest CT 07/17/2018   Trigeminal neuralgia of right side of face 02/01/2018   Occipital neuralgia 02/01/2018   Chronic migraine without aura without status migrainosus, not intractable 02/01/2018   Decreased strength 05/10/2017   Family history of breast cancer    Family history of prostate cancer    Chronic left shoulder pain 09/02/2015   Carpal tunnel syndrome 08/25/2011   Asthma 04/14/2009   Essential hypertension 04/14/2009   Hyperlipidemia 04/14/2009   DDD (degenerative disc disease), cervical 08/09/1999    PCP: Zada Rankin MD  REFERRING PROVIDER: Donaciano Sprang  REFERRING DIAG: M54.50 (ICD-10-CM) - Low back pain, unspecified   Rationale for Evaluation and Treatment: Rehabilitation  THERAPY DIAG:  Other low back pain  Muscle weakness (generalized)  Pain, lumbar region  ONSET DATE: chronic  SUBJECTIVE:                                                                                                                                                                                           SUBJECTIVE STATEMENT: Pt reports that her pain in back elevated when redressing after last session.  She plans to visit with Drs on Mon/Tues.  Also plans to join [facility] at d/c.     Initial Subjective LBP 10/10 every morning.  Pain goes through bilat calf. I go to bed icing and wake up icing.   Never was able to get membership to a pool to continue with aquatic  therapy after DC last year.  I have been keeping up with a land based exercises program instructed recently from another clinic. Will be getting access to [facility] in next week.  Want to be instructed again on exercises to make sure I am doing them right  PERTINENT HISTORY:  2002 lumar micro discectomy  2007 C6-7 Fusion 2014 L5-S1 fusion   2015-2016 R knee TKA and revision   2022 lumbar nerve stimulator   Dr  Burnetta: Since her last evaluation she had bilateral SI joint injections and has noted significant improvement. Her insurance denied the intradiscal injection. Her clinical exam demonstrates positive sacroiliac joint pain with confirmatory provocative testing. 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 Yes: NPRS scale: current 8/10 Pain location: LB (L>R) buttock and through calves Pain description: ache burning; stabbing Aggravating factors: walking, lying in bed; activity Relieving factors: lidocaine  patches; rest; stretching; ice/heat  PRECAUTIONS: None  RED FLAGS: None   WEIGHT BEARING RESTRICTIONS: No  FALLS:  Has patient fallen in last 6 months? Yes. 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  DIAGNOSTIC FINDINGS:  IMPRESSION: 1. At L3-4 there is a broad-based disc bulge. Severe bilateral facet arthropathy. Moderate spinal stenosis. Bilateral subarticular recess stenosis. Moderate left foraminal stenosis. Moderate-severe  right foraminal stenosis. 2. At L4-5 there is a broad-based disc bulge. Moderate bilateral facet arthropathy with bilateral mild facet effusions. Severe spinal stenosis. Severe right foraminal stenosis. Mild-moderate left foraminal stenosis. 3. Posterior lumbar fusion at L5-S1 with a broad-based disc bulge. No spinal stenosis. Mild bilateral foraminal stenosis. 4. No acute osseous injury of the lumbar spine.       Fusion hardware noted at L5-S1 no abnormalities noted adjacent segment disease noted at L4-5 and L3-4.   PATIENT SURVEYS:  ODI 25/50=50%  COGNITION: Overall cognitive status: Within functional limits for tasks assessed     SENSATION: Numbness and tingling through le into mid calves   POSTURE: rounded shoulders and decreased lumbar lordosis   PALPATION: TTP of L lumbar paraspinals and L QL; hypertonicity of bilat lumbar paraspinals and QL  LUMBAR ROM:   AROM eval  Flexion 75% limited P!  Extension Unable to get to  neutral P!  Right lateral flexion 75 % limited P!  Left lateral flexion 75 % limited P!  Right rotation   Left rotation    (Blank rows = not tested)  LOWER EXTREMITY ROM:     wfl  LOWER EXTREMITY MMT:    HD (lb) Right eval Left eval  Hip flexion 29.6 32.8  Hip extension    Hip abduction 21.1 25.1  Hip adduction    Hip internal rotation    Hip external rotation    Knee flexion    Knee extension    Ankle dorsiflexion    Ankle plantarflexion    Ankle inversion    Ankle eversion     (Blank rows = not tested)  FUNCTIONAL TESTS:  TUG:18.86  4 stage balance: Passed 1&2&3.  SLS: GAIT: Distance walked: 500 ft Assistive device utilized: None Level of assistance: Complete Independence Comments: guarded  TREATMENT  [facility] Adult PT Treatment:                                                DATE: 04/19/24 Pt seen for aquatic therapy today.  Treatment took place in water  3.5-4.75 ft in depth at the [facility] pool. Temp of water   was 91.  Pt entered/exited the pool via stairs using alternating pattern with hand rail.  Exercises -Walking forward and back UE on barbell - plank with hands on bench (cues for neutral hips and head)-> alternating hip extension -> opp arm/leg lifts - Plank to/from superman position  (cues for neutral hips) with UE on yellow noodle-> yellow hand floats -> fly motion with arms - suitcase carry with bil and single yellow hand floats under water  at side, walking forward/backward - L stretch at rails with gentle tail wag  -seated swing like on yellow noodle at 4 ft: PPT; hip hiking; rotation cw&ccw - bow and arrow x 10 each side, with rainbow hand floats - cycling on yellow noodle                                                                                                                   PATIENT EDUCATION:  Education details: reacquainting with aquatic therapy   Person educated: Patient Education method: Programmer, multimedia, demo Education comprehension: verbalized understanding  HOME EXERCISE PROGRAM: [id number]   ASSESSMENT:  CLINICAL IMPRESSION: Pt reports reduction of pain to 5/10 during session. She required some cues for more neutral pelvis when in plank position; good tolerance when positioned correctly.  She reports some increase in Lt lower back pain when holding single yellow hand float under water  at side.  Added written cues to existing aquatic HEP for improved form.  Goals ongoing.    Initial Impression Patient is a 63 y.o. f who was seen today for physical therapy evaluation and treatment for low back.   Pt is well known to this clinic and to PT having completed several episodes of care.  She was seen x 1 year ago for lengthy episode for similar Dx.  She presents today with acute on chronic back pain with SIJ involvement and pending surgical intervention. She was unable to gain pool access to continue with aquatic HEP as assigned last episode  ACTIVITY LIMITATIONS: carrying,  lifting, sleeping, transfers, dressing, and bending, squatting   PARTICIPATION LIMITATIONS: cleaning, laundry, yard work, community activity, and exercise   PERSONAL FACTORS: Age, Fitness, Time since onset of injury/illness/exacerbation, and 3+ comorbidities:  are also affecting patient's functional outcome.    REHAB POTENTIAL: Fair: Chronic   CLINICAL DECISION MAKING: unstable/complicated  EVALUATION COMPLEXITY: Low   GOALS: Goals reviewed with patient? Yes  SHORT TERM GOALS: Target date: 04/17/24  Pt will tolerate full aquatic sessions consistently without increase in pain and with improving function to demonstrate good toleration and effectiveness of intervention.  Baseline: Goal status:Met 04/17/24    LONG TERM GOALS: Target date: 05/17/24  Pt to improve on ODI by 13 % to demonstrate statistically significant Improvement in function. (MCID 13-15%) Baseline: 25/50=50% Goal status: INITIAL  2.  Pt will be indep with updated and final Aquatic HEP for continued management of condition Baseline:  Goal status: INITIAL  3.  Pt will report decrease in pain by at least 50% while submerged for improved toleration to activity to improved management of pain. Baseline:  Goal status: INITIAL  4.  Pt to gain access to pool for continued ability to complete HEP Baseline: none Goal status: INITIAL   PLAN:  PT FREQUENCY: 1-2x/week  PT DURATION: 6 weeks extended due to scheduling conflicts  PLANNED INTERVENTIONS: [id number]- PT Re-evaluation, 97750- Physical Performance Testing, 97110-Therapeutic exercises, 97530- Therapeutic activity, 97112- Neuromuscular re-education, 97535- Self Care, [id number]- Manual therapy, [id number]- Gait training, [phone]- Aquatic Therapy, [phone]- Electrical stimulation (manual), [id number]- Ionotophoresis 4mg /ml Dexamethasone , [id number] (1-2 muscles), [id number] (3+ muscles)- Dry Needling, Patient/Family education, Balance training, Stair training, Taping, Joint mobilization, DME  instructions, Cryotherapy, and Moist heat.  PLAN FOR NEXT SESSION: aquatic: re-instruction on aquatic HEP-additions/modification as needed; core and LE stretching and strengthening; pain management  Delon Aquas, PTA 04/19/24 8:58 AM [facility] [address] [place], [place], [address] Phone: [phone]   Fax:  [phone]

## 2024-04-22 ENCOUNTER — Ambulatory Visit (HOSPITAL_BASED_OUTPATIENT_CLINIC_OR_DEPARTMENT_OTHER): Admitting: Physical Therapy

## 2024-04-22 DIAGNOSIS — M9904 Segmental and somatic dysfunction of sacral region: Secondary | ICD-10-CM | POA: Diagnosis not present

## 2024-04-23 DIAGNOSIS — G894 Chronic pain syndrome: Secondary | ICD-10-CM | POA: Diagnosis not present

## 2024-04-23 DIAGNOSIS — M25561 Pain in right knee: Secondary | ICD-10-CM | POA: Diagnosis not present

## 2024-04-23 DIAGNOSIS — M961 Postlaminectomy syndrome, not elsewhere classified: Secondary | ICD-10-CM | POA: Diagnosis not present

## 2024-04-23 DIAGNOSIS — M7918 Myalgia, other site: Secondary | ICD-10-CM | POA: Diagnosis not present

## 2024-04-23 DIAGNOSIS — M533 Sacrococcygeal disorders, not elsewhere classified: Secondary | ICD-10-CM | POA: Diagnosis not present

## 2024-04-24 ENCOUNTER — Ambulatory Visit (HOSPITAL_BASED_OUTPATIENT_CLINIC_OR_DEPARTMENT_OTHER): Admitting: Physical Therapy

## 2024-04-24 ENCOUNTER — Encounter (HOSPITAL_BASED_OUTPATIENT_CLINIC_OR_DEPARTMENT_OTHER): Payer: Self-pay

## 2024-04-29 ENCOUNTER — Ambulatory Visit (HOSPITAL_BASED_OUTPATIENT_CLINIC_OR_DEPARTMENT_OTHER): Admitting: Physical Therapy

## 2024-04-29 ENCOUNTER — Encounter (HOSPITAL_BASED_OUTPATIENT_CLINIC_OR_DEPARTMENT_OTHER): Payer: Self-pay | Admitting: Physical Therapy

## 2024-04-29 DIAGNOSIS — R262 Difficulty in walking, not elsewhere classified: Secondary | ICD-10-CM

## 2024-04-29 DIAGNOSIS — M5459 Other low back pain: Secondary | ICD-10-CM | POA: Diagnosis not present

## 2024-04-29 DIAGNOSIS — M545 Low back pain, unspecified: Secondary | ICD-10-CM

## 2024-04-29 DIAGNOSIS — M6281 Muscle weakness (generalized): Secondary | ICD-10-CM | POA: Diagnosis not present

## 2024-04-29 NOTE — Therapy (Signed)
 OUTPATIENT PHYSICAL THERAPY THORACOLUMBAR TREATMENT   Patient Name: Cassidy Olsen MRN: [id number] DOB:February 12, 1961, 63 y.o., female Today's Date: 04/29/2024  END OF SESSION:  PT End of Session - 04/29/24 0902     Visit Number 5    Date for Recertification  05/17/24    Authorization Type Medicare    PT Start Time 0845    PT Stop Time 0925    PT Time Calculation (min) 40 min    Activity Tolerance Patient tolerated treatment well    Behavior During Therapy [facility] for tasks assessed/performed          Past Medical History:  Diagnosis Date   ADHD    Arthritis 2010   Asthma    Back pain    Chronic headaches    Chronic pain    Constipation    Edema of both lower extremities    Family history of breast cancer    Family history of prostate cancer    Gallstones 2018   GERD (gastroesophageal reflux disease)    High cholesterol    History of kidney stones    Hx: UTI (urinary tract infection)    Hypertension    IBS (irritable bowel syndrome)    Interstitial cystitis 2012   Joint pain    Kidney problem    Migraine    Occipital neuralgia    Osteoarthritis    PNA (pneumonia) 2018   Posttraumatic muscle contracture (HCC)    Pre-diabetes    Spinal stenosis    Trigeminal neuralgia    Past Surgical History:  Procedure Laterality Date   ABDOMINAL HYSTERECTOMY     ANKLE SURGERY Left 05/2009   APPENDECTOMY     Benign Tumor Removal Right 01/26/2017   right upper arm by Dr. Dale Melia at [facility] [place]    BRAIN SURGERY     CARPAL TUNNEL RELEASE Right 2014   CHOLECYSTECTOMY N/A 12/24/2020   Procedure: LAPAROSCOPIC CHOLECYSTECTOMY;  Surgeon: Vernetta Berg, MD;  Location: [facility] OR;  Service: General;  Laterality: N/A;   COLONOSCOPY     CYSTOSCOPY W/ URETERAL STENT PLACEMENT Left 07/25/2019   Procedure: CYSTOSCOPY WITH RETROGRADE PYELOGRAM/URETERAL STENT PLACEMENT;  Surgeon: Matilda Senior, MD;  Location: [facility] ORS;  Service: Urology;  Laterality: Left;   CYSTOSCOPY W/  URETERAL STENT REMOVAL Left 08/15/2019   Procedure: CYSTOSCOPY WITH STENT REMOVAL;  Surgeon: Matilda Senior, MD;  Location: [facility] OR[facility];  Service: Urology;  Laterality: Left;   CYSTOSCOPY WITH RETROGRADE PYELOGRAM, URETEROSCOPY AND STENT PLACEMENT Left 08/15/2019   Procedure: CYSTOSCOPY WITH RETROGRADE PYELOGRAM, URETEROSCOPY;  Surgeon: Matilda Senior, MD;  Location: [facility];  Service: Urology;  Laterality: Left;  90 MINS   CYSTOSCOPY WITH RETROGRADE PYELOGRAM, URETEROSCOPY AND STENT PLACEMENT Right 09/12/2019   Procedure: CYSTOSCOPY WITH RIGHT RETROGRADE PYELOGRAM  URETEROSCOPY WITH HOLMIUM LASER STONE EXTRACTION AND STENT PLACEMENT;  Surgeon: Matilda Senior, MD;  Location: [facility];  Service: Urology;  Laterality: Right;   DILATION AND CURETTAGE OF UTERUS     HOLMIUM LASER APPLICATION Left 08/15/2019   Procedure: HOLMIUM LASER APPLICATION;  Surgeon: Matilda Senior, MD;  Location: [facility];  Service: Urology;  Laterality: Left;   JOINT REPLACEMENT     R knee   KIDNEY STONE SURGERY     neuro spine similator  01/2021   OVARIAN CYST REMOVAL  05/2010   right knee revision  2016   ROTATOR CUFF REPAIR Right 02/2019   SPINE SURGERY     x 3   TUBAL LIGATION     Patient Active  Problem List   Diagnosis Date Noted   Dyslipidemia 03/28/2024   Arthralgia of right ankle 01/12/2023   Gastroesophageal reflux disease 12/18/2022   Spinal stenosis 12/18/2022   Obesity: Starting BMI 33.3 11/09/2022   BMI 30.0-30.9,adult Current BMI 30.9 11/09/2022   Dysphagia 09/22/2022   Hypertension, essential 07/26/2022   Class 1 obesity with serious comorbidity and body mass index (BMI) of 33.0 to 33.9 in adult 06/23/2022   Mood disorder (HCC)- with emotional eating 05/23/2022   Mixed hyperlipidemia 05/23/2022   Prediabetes 04/26/2022   Depression 04/26/2022   Allergic rhinitis 01/26/2022   Morbid obesity (HCC) 09/24/2021   Exertional chest pain 09/24/2021   S/P laparoscopic cholecystectomy 12/24/2020    RUQ abdominal pain 12/25/2019   Decreased range of knee movement 09/02/2019   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   Irritable bowel syndrome with constipation 09/02/2019   Calculus, ureter 07/24/2019   Chronic migraine without aura, with intractable migraine, so stated, with status migrainosus 05/30/2019   Chronic asthmatic bronchitis (HCC) 05/14/2019   Excessive daytime sleepiness 01/08/2019   Upper airway cough syndrome 11/12/2018   Acute bacterial sinusitis 07/17/2018   Abnormal chest CT 07/17/2018   Trigeminal neuralgia of right side of face 02/01/2018   Occipital neuralgia 02/01/2018   Chronic migraine without aura without status migrainosus, not intractable 02/01/2018   Decreased strength 05/10/2017   Family history of breast cancer    Family history of prostate cancer    Chronic left shoulder pain 09/02/2015   Carpal tunnel syndrome 08/25/2011   Asthma 04/14/2009   Essential hypertension 04/14/2009   Hyperlipidemia 04/14/2009   DDD (degenerative disc disease), cervical 08/09/1999    PCP: Zada Rankin MD  REFERRING PROVIDER: Donaciano Sprang  REFERRING DIAG: M54.50 (ICD-10-CM) - Low back pain, unspecified   Rationale for Evaluation and Treatment: Rehabilitation  THERAPY DIAG:  Other low back pain  Muscle weakness (generalized)  Pain, lumbar region  Difficulty walking  ONSET DATE: chronic  SUBJECTIVE:                                                                                                                                                                                           SUBJECTIVE STATEMENT: Pt reports her dr suggests back surgery for SI. She reports that her Lt lower back is still tender and swollen from ablation.  She plans to join [facility] at d/c.     Initial Subjective LBP 10/10 every morning.  Pain goes through bilat calf. I go to bed icing and wake up icing.   Never was able to get membership to a pool to continue with aquatic  therapy after DC  last year.  I have been keeping up with a land based exercises program instructed recently from another clinic. Will be getting access to [facility] in next week.  Want to be instructed again on exercises to make sure I am doing them right  PERTINENT HISTORY:  2002 lumar micro discectomy  2007 C6-7 Fusion 2014 L5-S1 fusion   2015-2016 R knee TKA and revision   2022 lumbar nerve stimulator   Dr  Burnetta: Since her last evaluation she had bilateral SI joint injections and has noted significant improvement. Her insurance denied the intradiscal injection. Her clinical exam demonstrates positive sacroiliac joint pain with confirmatory provocative testing. 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 Yes: NPRS scale: current 6/10 Pain location: LB (L>R) buttock and through calves Pain description: ache burning; stabbing Aggravating factors: walking, lying in bed; activity Relieving factors: lidocaine  patches; rest; stretching; ice/heat  PRECAUTIONS: None  RED FLAGS: None   WEIGHT BEARING RESTRICTIONS: No  FALLS:  Has patient fallen in last 6 months? Yes. 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  DIAGNOSTIC FINDINGS:  IMPRESSION: 1. At L3-4 there is a broad-based disc bulge. Severe bilateral facet arthropathy. Moderate spinal stenosis. Bilateral subarticular recess stenosis. Moderate left foraminal stenosis. Moderate-severe  right foraminal stenosis. 2. At L4-5 there is a broad-based disc bulge. Moderate bilateral facet arthropathy with bilateral mild facet effusions. Severe spinal stenosis. Severe right foraminal stenosis. Mild-moderate left foraminal stenosis. 3. Posterior lumbar fusion at L5-S1 with a broad-based disc bulge. No spinal stenosis. Mild bilateral foraminal stenosis. 4. No acute osseous injury of the lumbar spine.       Fusion hardware noted at L5-S1 no abnormalities noted adjacent segment disease noted at L4-5 and L3-4.   PATIENT SURVEYS:  ODI 25/50=50%  COGNITION: Overall cognitive status: Within functional limits for tasks assessed     SENSATION: Numbness and tingling through le into mid calves   POSTURE: rounded shoulders and decreased lumbar lordosis   PALPATION: TTP of L lumbar paraspinals and L QL; hypertonicity of bilat lumbar paraspinals and QL  LUMBAR ROM:   AROM eval  Flexion 75% limited P!  Extension Unable to get to  neutral P!  Right lateral flexion 75 % limited P!  Left lateral flexion 75 % limited P!  Right rotation   Left rotation    (Blank rows = not tested)  LOWER EXTREMITY ROM:     wfl  LOWER EXTREMITY MMT:    HD (lb) Right eval Left eval  Hip flexion 29.6 32.8  Hip extension    Hip abduction 21.1 25.1  Hip adduction    Hip internal rotation    Hip external rotation    Knee flexion    Knee extension    Ankle dorsiflexion    Ankle plantarflexion    Ankle inversion    Ankle eversion     (Blank rows = not tested)  FUNCTIONAL TESTS:  TUG:18.86   4 stage balance:  Passed 1&2&3.  SLS: GAIT: Distance walked: 500 ft Assistive device utilized: None Level of assistance: Complete Independence Comments: guarded  TREATMENT  [facility] Adult PT Treatment:                                                DATE: 04/29/24 Pt seen for aquatic therapy today.  Treatment took place in water  3.5-4.75 ft in depth at the [facility] pool. Temp of water   was 91.  Pt entered/exited the pool via stairs using alternating pattern with hand rail.  Exercises -Walking forward and backward and side stepping, UE on hand floats - side step into wide squat with arm add/abdct with yellow hand floats - suitcase carry with bil and single rainbow hand floats under water  at side, walking forward/backward - L stretch at rails - hip hinge with UE on blue hollow noodle - TrA set with bil rainbow hand float pull down to thighs - bow and arrow x 10 each side, with rainbow hand floats - Plank to/from superman position  (cues for neutral hips) with UE on yellow hand floats -> alternating Leg lifts -seated swing like on yellow noodle at 4 ft: PPT; hip hiking; rotation cw&ccw - cycling on yellow noodle                                                                                                                   PATIENT EDUCATION:  Education details: reacquainting with aquatic therapy   Person educated: Patient Education method: Programmer, multimedia, demo Education comprehension: verbalized understanding  HOME EXERCISE PROGRAM: [id number]   ASSESSMENT:  CLINICAL IMPRESSION: Pt reported pain remained at 6/10 during session. Pt required very minor cues for posture and form with existing exercises.  Added written cues to aquatic HEP. Pt is making gradual progress towards remaining goals. Therapist to assess goals and d/c next visit.     Initial Impression Patient is a 63 y.o. f who was seen today for physical therapy evaluation and treatment for low back.   Pt is well known to this clinic and to PT having completed several episodes of care.  She was seen x 1 year ago for lengthy episode for similar Dx.  She presents today with acute on chronic back pain with SIJ involvement and pending surgical intervention. She was unable to gain pool access to continue with aquatic HEP as assigned last episode  ACTIVITY LIMITATIONS: carrying, lifting, sleeping, transfers,  dressing, and bending, squatting   PARTICIPATION LIMITATIONS: cleaning, laundry, yard work, community activity, and exercise   PERSONAL FACTORS: Age, Fitness, Time since onset of injury/illness/exacerbation, and 3+ comorbidities:  are also affecting patient's functional outcome.    REHAB POTENTIAL: Fair: Chronic   CLINICAL DECISION MAKING: unstable/complicated  EVALUATION COMPLEXITY: Low   GOALS: Goals reviewed with patient? Yes  SHORT TERM GOALS: Target date: 04/17/24  Pt will tolerate full aquatic sessions consistently  without increase in pain and with improving function to demonstrate good toleration and effectiveness of intervention.  Baseline: Goal status:Met 04/17/24    LONG TERM GOALS: Target date: 05/17/24  Pt to improve on ODI by 13 % to demonstrate statistically significant Improvement in function. (MCID 13-15%) Baseline: 25/50=50% Goal status: INITIAL  2.  Pt will be indep with updated and final Aquatic HEP for continued management of condition Baseline:  Goal status: in progress - 04/29/24  3.  Pt will report decrease in pain by at least 50% while submerged for improved toleration to activity to improved management of pain. Baseline: reduced by 10-20% Goal status: In progress - 04/29/2024  4.  Pt to gain access to pool for continued ability to complete HEP Baseline: none Goal status: INITIAL   PLAN:  PT FREQUENCY: 1-2x/week  PT DURATION: 6 weeks extended due to scheduling conflicts  PLANNED INTERVENTIONS: [id number]- PT Re-evaluation, 97750- Physical Performance Testing, 97110-Therapeutic exercises, 97530- Therapeutic activity, 97112- Neuromuscular re-education, 97535- Self Care, [id number]- Manual therapy, [id number]- Gait training, [phone]- Aquatic Therapy, [phone]- Electrical stimulation (manual), [id number]- Ionotophoresis 4mg /ml Dexamethasone , 79439 (1-2 muscles), 20561 (3+ muscles)- Dry Needling, Patient/Family education, Balance training, Stair training, Taping, Joint  mobilization, DME instructions, Cryotherapy, and Moist heat.  PLAN FOR NEXT SESSION: aquatic:  d/c.  Delon Aquas, PTA 04/29/24 9:25 AM [facility] [address] [place], [place], [address] Phone: [phone]   Fax:  [phone]

## 2024-05-06 ENCOUNTER — Ambulatory Visit (HOSPITAL_BASED_OUTPATIENT_CLINIC_OR_DEPARTMENT_OTHER): Payer: Self-pay | Admitting: Physical Therapy

## 2024-05-14 ENCOUNTER — Ambulatory Visit (HOSPITAL_BASED_OUTPATIENT_CLINIC_OR_DEPARTMENT_OTHER): Admitting: Physical Therapy

## 2024-05-14 DIAGNOSIS — M533 Sacrococcygeal disorders, not elsewhere classified: Secondary | ICD-10-CM | POA: Diagnosis not present

## 2024-05-17 ENCOUNTER — Ambulatory Visit (HOSPITAL_BASED_OUTPATIENT_CLINIC_OR_DEPARTMENT_OTHER): Admitting: Physical Therapy

## 2024-05-20 ENCOUNTER — Ambulatory Visit (HOSPITAL_BASED_OUTPATIENT_CLINIC_OR_DEPARTMENT_OTHER): Payer: Self-pay | Admitting: Physical Therapy

## 2024-05-21 ENCOUNTER — Ambulatory Visit (HOSPITAL_BASED_OUTPATIENT_CLINIC_OR_DEPARTMENT_OTHER): Admitting: Physical Therapy

## 2024-05-22 ENCOUNTER — Ambulatory Visit (HOSPITAL_BASED_OUTPATIENT_CLINIC_OR_DEPARTMENT_OTHER): Payer: Self-pay | Attending: Orthopedic Surgery | Admitting: Physical Therapy

## 2024-05-22 ENCOUNTER — Other Ambulatory Visit: Payer: Self-pay | Admitting: Gastroenterology

## 2024-05-22 DIAGNOSIS — J029 Acute pharyngitis, unspecified: Secondary | ICD-10-CM | POA: Diagnosis not present

## 2024-05-24 ENCOUNTER — Ambulatory Visit (HOSPITAL_BASED_OUTPATIENT_CLINIC_OR_DEPARTMENT_OTHER): Admitting: Physical Therapy

## 2024-05-28 ENCOUNTER — Ambulatory Visit (HOSPITAL_BASED_OUTPATIENT_CLINIC_OR_DEPARTMENT_OTHER): Admitting: Physical Therapy

## 2024-05-28 ENCOUNTER — Other Ambulatory Visit: Payer: Self-pay | Admitting: Gastroenterology

## 2024-05-28 DIAGNOSIS — J029 Acute pharyngitis, unspecified: Secondary | ICD-10-CM | POA: Diagnosis not present

## 2024-05-28 DIAGNOSIS — J069 Acute upper respiratory infection, unspecified: Secondary | ICD-10-CM | POA: Diagnosis not present

## 2024-05-29 ENCOUNTER — Ambulatory Visit (HOSPITAL_BASED_OUTPATIENT_CLINIC_OR_DEPARTMENT_OTHER): Payer: Self-pay | Attending: Orthopedic Surgery | Admitting: Physical Therapy

## 2024-05-31 ENCOUNTER — Ambulatory Visit (HOSPITAL_BASED_OUTPATIENT_CLINIC_OR_DEPARTMENT_OTHER): Admitting: Physical Therapy

## 2024-06-03 DIAGNOSIS — B370 Candidal stomatitis: Secondary | ICD-10-CM | POA: Diagnosis not present

## 2024-06-03 DIAGNOSIS — I10 Essential (primary) hypertension: Secondary | ICD-10-CM | POA: Diagnosis not present

## 2024-06-04 ENCOUNTER — Ambulatory Visit (HOSPITAL_BASED_OUTPATIENT_CLINIC_OR_DEPARTMENT_OTHER): Admitting: Physical Therapy

## 2024-06-07 ENCOUNTER — Encounter (HOSPITAL_COMMUNITY): Payer: Self-pay

## 2024-06-07 ENCOUNTER — Ambulatory Visit (HOSPITAL_BASED_OUTPATIENT_CLINIC_OR_DEPARTMENT_OTHER): Admitting: Physical Therapy

## 2024-06-07 ENCOUNTER — Ambulatory Visit (HOSPITAL_COMMUNITY)
Admission: RE | Admit: 2024-06-07 | Discharge: 2024-06-07 | Disposition: A | Discharge status: Home / Self Care | Source: Ambulatory Visit | Attending: Family Medicine | Admitting: Family Medicine

## 2024-06-07 ENCOUNTER — Ambulatory Visit (INDEPENDENT_AMBULATORY_CARE_PROVIDER_SITE_OTHER)

## 2024-06-07 VITALS — BP 113/74 | HR 116 | Temp 98.6°F | Resp 18 | Ht 62.0 in | Wt 181.2 lb

## 2024-06-07 DIAGNOSIS — R051 Acute cough: Secondary | ICD-10-CM

## 2024-06-07 DIAGNOSIS — J029 Acute pharyngitis, unspecified: Secondary | ICD-10-CM | POA: Diagnosis not present

## 2024-06-07 DIAGNOSIS — R0789 Other chest pain: Secondary | ICD-10-CM | POA: Diagnosis not present

## 2024-06-07 MED ORDER — HYDROCOD POLI-CHLORPHE POLI ER 10-8 MG/5ML PO SUER: 5.0000 mL | Freq: Two times a day (BID) | ORAL | 0 refills | Status: DC | PRN

## 2024-06-07 MED ORDER — LIDOCAINE VISCOUS HCL 2 % MT SOLN
15.0000 mL | Freq: Once | OROMUCOSAL | Status: AC
  Administered 2024-06-07: 15 mL via OROMUCOSAL

## 2024-06-07 MED ORDER — LIDOCAINE VISCOUS HCL 2 % MT SOLN
OROMUCOSAL | Status: AC
  Filled 2024-06-07: qty 15

## 2024-06-07 MED ORDER — ALUM & MAG HYDROXIDE-SIMETH 200-200-20 MG/5ML PO SUSP
ORAL | Status: AC
Start: 2024-06-07 — End: 2024-06-07
  Filled 2024-06-07: qty 30

## 2024-06-07 MED ORDER — ALUM & MAG HYDROXIDE-SIMETH 200-200-20 MG/5ML PO SUSP
30.0000 mL | Freq: Once | ORAL | Status: AC
  Administered 2024-06-07: 30 mL via ORAL

## 2024-06-07 NOTE — Discharge Instructions (Addendum)
 You were seen today for cough and sore throat.  Your xray was read as normal today.  I have sent out a medication to help with cough.  Please follow up with ENT and your primary care as needed.

## 2024-06-07 NOTE — ED Provider Notes (Signed)
 [facility]    CSN: [id number] Arrival date & time: 06/07/24  1151      History   Chief Complaint Chief Complaint  Patient presents with   Appointment    1200   Sore Throat    HPI Cassidy Olsen is a 63 y.o. female.    Sore Throat   Patient is here for continued sore throat.  She has had a sore throat for almost a month.  She did see her pcp 3 times in the last 3 weeks.  Strep x 2 negative, flu/covid x 2 negative.  She was given a zpack, zyrtec for symptoms.  Returned, and was dx with thrush.  She started this 10/27.  Yesterday she was using this, and felt that it got caught in her throat and has not cleared.  She is having a dry cough since then.  Cough is much better but having chest discomfort and sob from the coughing. .  She feels there is something stuck to the left side of the chest.   This has happened in the past, feeling like something gets stuck in the wrong spot.  She is usually able to cough and clear but this is not.         Past Medical History:  Diagnosis Date   ADHD    Arthritis 2010   Asthma    Back pain    Chronic headaches    Chronic pain    Constipation    Edema of both lower extremities    Family history of breast cancer    Family history of prostate cancer    Gallstones 2018   GERD (gastroesophageal reflux disease)    High cholesterol    History of kidney stones    Hx: UTI (urinary tract infection)    Hypertension    IBS (irritable bowel syndrome)    Interstitial cystitis 2012   Joint pain    Kidney problem    Migraine    Occipital neuralgia    Osteoarthritis    PNA (pneumonia) 2018   Posttraumatic muscle contracture    Pre-diabetes    Spinal stenosis    Trigeminal neuralgia     Patient Active Problem List   Diagnosis Date Noted   Dyslipidemia 03/28/2024   Arthralgia of right ankle 01/12/2023   Gastroesophageal reflux disease 12/18/2022   Spinal stenosis 12/18/2022   Obesity: Starting BMI 33.3  11/09/2022   BMI 30.0-30.9,adult Current BMI 30.9 11/09/2022   Dysphagia 09/22/2022   Hypertension, essential 07/26/2022   Class 1 obesity with serious comorbidity and body mass index (BMI) of 33.0 to 33.9 in adult 06/23/2022   Mood disorder (HCC)- with emotional eating 05/23/2022   Mixed hyperlipidemia 05/23/2022   Prediabetes 04/26/2022   Depression 04/26/2022   Allergic rhinitis 01/26/2022   Morbid obesity (HCC) 09/24/2021   Exertional chest pain 09/24/2021   S/P laparoscopic cholecystectomy 12/24/2020   RUQ abdominal pain 12/25/2019   Decreased range of knee movement 09/02/2019   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   Irritable bowel syndrome with constipation 09/02/2019   Calculus, ureter 07/24/2019   Chronic migraine without aura, with intractable migraine, so stated, with status migrainosus 05/30/2019   Chronic asthmatic bronchitis (HCC) 05/14/2019   Excessive daytime sleepiness 01/08/2019   Upper airway cough syndrome 11/12/2018   Acute bacterial sinusitis 07/17/2018   Abnormal chest CT 07/17/2018   Trigeminal neuralgia of right side of face 02/01/2018   Occipital neuralgia 02/01/2018   Chronic migraine without aura without status  migrainosus, not intractable 02/01/2018   Decreased strength 05/10/2017   Family history of breast cancer    Family history of prostate cancer    Chronic left shoulder pain 09/02/2015   Carpal tunnel syndrome 08/25/2011   Asthma 04/14/2009   Essential hypertension 04/14/2009   Hyperlipidemia 04/14/2009   DDD (degenerative disc disease), cervical 08/09/1999    Past Surgical History:  Procedure Laterality Date   ABDOMINAL HYSTERECTOMY     ANKLE SURGERY Left 05/2009   APPENDECTOMY     Benign Tumor Removal Right 01/26/2017   right upper arm by Dr. Dale Melia at [facility] [place]    BRAIN SURGERY     CARPAL TUNNEL RELEASE Right 2014   CHOLECYSTECTOMY N/A 12/24/2020   Procedure: LAPAROSCOPIC CHOLECYSTECTOMY;  Surgeon:  Vernetta Berg, MD;  Location: [facility] OR;  Service: General;  Laterality: N/A;   COLONOSCOPY     CYSTOSCOPY W/ URETERAL STENT PLACEMENT Left 07/25/2019   Procedure: CYSTOSCOPY WITH RETROGRADE PYELOGRAM/URETERAL STENT PLACEMENT;  Surgeon: Matilda Senior, MD;  Location: [facility] ORS;  Service: Urology;  Laterality: Left;   CYSTOSCOPY W/ URETERAL STENT REMOVAL Left 08/15/2019   Procedure: CYSTOSCOPY WITH STENT REMOVAL;  Surgeon: Matilda Senior, MD;  Location: [facility];  Service: Urology;  Laterality: Left;   CYSTOSCOPY WITH RETROGRADE PYELOGRAM, URETEROSCOPY AND STENT PLACEMENT Left 08/15/2019   Procedure: CYSTOSCOPY WITH RETROGRADE PYELOGRAM, URETEROSCOPY;  Surgeon: Matilda Senior, MD;  Location: [facility];  Service: Urology;  Laterality: Left;  90 MINS   CYSTOSCOPY WITH RETROGRADE PYELOGRAM, URETEROSCOPY AND STENT PLACEMENT Right 09/12/2019   Procedure: CYSTOSCOPY WITH RIGHT RETROGRADE PYELOGRAM  URETEROSCOPY WITH HOLMIUM LASER STONE EXTRACTION AND STENT PLACEMENT;  Surgeon: Matilda Senior, MD;  Location: [facility];  Service: Urology;  Laterality: Right;   DILATION AND CURETTAGE OF UTERUS     HOLMIUM LASER APPLICATION Left 08/15/2019   Procedure: HOLMIUM LASER APPLICATION;  Surgeon: Matilda Senior, MD;  Location: [facility];  Service: Urology;  Laterality: Left;   JOINT REPLACEMENT     R knee   KIDNEY STONE SURGERY     neuro spine similator  01/2021   OVARIAN CYST REMOVAL  05/2010   right knee revision  2016   ROTATOR CUFF REPAIR Right 02/2019   SPINE SURGERY     x 3   TUBAL LIGATION      OB History     Gravida  3   Para  1   Term      Preterm      AB  2   Living  1      SAB  1   IAB  1   Ectopic      Multiple      Live Births               Home Medications    Prior to Admission medications   Medication Sig Start Date End Date Taking? Authorizing Provider  ACETAMINOPHEN  PO Take 1,300 mg by mouth 2 (two) times daily.   Yes [provider]   albuterol  (VENTOLIN  HFA) 108 (90 Base) MCG/ACT inhaler Inhale 2 puffs into the lungs every 6 (six) hours as needed for wheezing or shortness of breath. 06/02/22  Yes Icard, Bradley L, DO  AMBULATORY NON FORMULARY MEDICATION 1 tablet 2 (two) times daily. Medication Name: Migrelief   Yes [provider]  amitriptyline  (ELAVIL ) 50 MG tablet TAKE 1 TABLET BY MOUTH EVERYDAY AT BEDTIME 02/23/24  Yes Ines Onetha NOVAK, MD  amLODipine (NORVASC) 2.5 MG tablet Take 1 tablet  by mouth daily. 12/11/23  Yes [provider]  atorvastatin  (LIPITOR) 20 MG tablet TAKE 1 TABLET BY MOUTH EVERY DAY 07/12/23  Yes Court Dorn PARAS, MD  botulinum toxin Type A  (BOTOX ) 200 units injection INJECT 155 UNITS INTRAMUSCULARLY EVERY 12 WEEKS (GIVEN AT MD  OFFICE, DISCARD UNUSED) 12/27/23  Yes Lomax, Amy, NP  celecoxib  (CELEBREX ) 200 MG capsule Take 200 mg by mouth 2 (two) times daily.   Yes [provider]  cetirizine (ZYRTEC) 10 MG tablet Take 10 mg by mouth daily.   Yes [provider]  dexlansoprazole  (DEXILANT ) 60 MG capsule Take 1 capsule (60 mg total) by mouth daily. 02/21/24  Yes Nandigam, Kavitha V, MD  dicyclomine  (BENTYL ) 10 MG capsule Take 2 capsules (20 mg total) by mouth in the morning and at bedtime. PRN 02/21/24  Yes Nandigam, Kavitha V, MD  ELDERBERRY PO ELDERBERRY IMMUNE SUPPORT   Yes [provider]  fluticasone  (FLONASE ) 50 MCG/ACT nasal spray Place 2 sprays into both nostrils daily. 06/08/20  Yes Icard, Adine CROME, DO  Fremanezumab -vfrm (AJOVY ) 225 MG/1.5ML SOAJ Inject 225 mg into the skin every 30 (thirty) days. 10/25/23  Yes Lomax, Amy, NP  gabapentin  (NEURONTIN ) 300 MG capsule Take 3 capsules (900 mg total) by mouth 3 (three) times daily. 05/24/22  Yes Lomax, Amy, NP  glycopyrrolate  (ROBINUL ) 2 MG tablet Take 1 tablet (2 mg total) by mouth 2 (two) times daily. 04/19/23  Yes Kerman Vina HERO, NP  hydrochlorothiazide (HYDRODIURIL) 25 MG tablet Take 25 mg by mouth. 03/28/24  06/26/24 Yes [provider]  lidocaine  4 % Place 1 patch onto the skin as needed.   Yes [provider]  lisinopril (ZESTRIL) 20 MG tablet Take 20 mg by mouth 2 (two) times daily. 06/03/24  Yes [provider]  lubiprostone  (AMITIZA ) 24 MCG capsule TAKE 1 CAPSULE (24 MCG TOTAL) BY MOUTH 2 (TWO) TIMES DAILY WITH A MEAL. 05/22/24  Yes Nandigam, Kavitha V, MD  montelukast  (SINGULAIR ) 10 MG tablet Take 1 tablet (10 mg total) by mouth at bedtime. 02/01/24  Yes Mannam, Praveen, MD  montelukast  (SINGULAIR ) 10 MG tablet Take 1 tablet (10 mg total) by mouth at bedtime. 02/01/24  Yes Mannam, Praveen, MD  MOVANTIK  25 MG TABS tablet TAKE 1 TABLET (25 MG TOTAL) BY MOUTH DAILY. 05/28/24  Yes Nandigam, Kavitha V, MD  NONFORMULARY OR COMPOUNDED ITEM Place 1 spray into the nose as needed (headache). 4% Lidocaine  nasal spray.   1 spray into nostril on affected headache side if needed for headache. May repeat 1 spray 10 minutes later if needed.   Yes Ines Onetha NOVAK, MD  nystatin  (MYCOSTATIN ) 100000 UNIT/ML suspension Take 500,000 Units by mouth. 06/03/24 06/17/24 Yes [provider]  Omega-3 Fatty Acids (OMEGA III EPA+DHA) 1000 MG CAPS Take by mouth. 09/22/22  Yes [provider]  Oregano Oil-Flaxseed Oil (OREGANO OIL IMMUNE SUPPORT) 50-25 MG CAPS OREGANO SUPPLEMENT   Yes [provider]  Spacer/Aero-Holding Chambers (AEROCHAMBER MV) inhaler Use as instructed 04/18/18  Yes Icard, Bradley L, DO  timolol  (TIMOPTIC ) 0.5 % ophthalmic solution 1-2 drops eyes at onset and 30 minutes after if needed. Can repeat in 2 hours for headache/migraine. 11/06/23  Yes Ines Onetha NOVAK, MD  traMADol  (ULTRAM ) 50 MG tablet Take 100 mg by mouth 3 (three) times daily. 2x daily   Yes [provider]  valACYclovir  (VALTREX ) 500 MG tablet Take 500 mg by mouth 2 (two) times daily as needed (outbreaks).    Yes [provider]  Vitamin D, Cholecalciferol, 25 MCG (1000 UT) CAPS  Take 1 capsule by mouth daily.   Yes [provider]    Family History Family History  Problem Relation Age of Onset   Ulcers Mother 5       peptic ulcer   Irritable bowel syndrome Mother    High blood pressure Mother    Sudden death Mother    Depression Mother    Prostate cancer Father 11   Heart disease Father    Irritable bowel syndrome Father    High blood pressure Father    Breast cancer Sister 6       ER+/PR+/Her2- breast cancer   Alzheimer's disease Sister    Hypertension Sister    Kidney disease Sister    Celiac disease Sister    Thyroid  disease Sister    Breast cancer Paternal Grandmother    Diabetes Paternal Grandmother    Cancer Paternal Uncle        unknown cancers   Liver disease Neg Hx    Esophageal cancer Neg Hx    Colon cancer Neg Hx     Social History Social History   Tobacco Use   Smoking status: Never   Smokeless tobacco: Never  Vaping Use   Vaping status: Never Used  Substance Use Topics   Alcohol use: Yes    Comment: socially   Drug use: No     Allergies   Aspirin, Ketorolac , Ketorolac  tromethamine , Penicillins, Codeine, Ibuprofen, Latex, Omeprazole magnesium, Pantoprazole  sodium, Penicillamine, Pregabalin, Phenazopyridine , and Pyridium  [phenazopyridine  hcl]   Review of Systems Review of Systems  Constitutional: Negative.   HENT:  Positive for sore throat.   Respiratory:  Positive for cough.   Cardiovascular: Negative.   Gastrointestinal: Negative.   Genitourinary: Negative.   Musculoskeletal: Negative.   Psychiatric/Behavioral: Negative.       Physical Exam Triage Vital Signs ED Triage Vitals  Encounter Vitals Group     BP 06/07/24 1231 113/74     Girls Systolic BP Percentile --      Girls Diastolic BP Percentile --      Boys Systolic BP Percentile --      Boys Diastolic BP Percentile --      Pulse Rate 06/07/24 1231 (!) 116     Resp 06/07/24 1231 18     Temp 06/07/24 1231 98.6 F (37 C)     Temp Source  06/07/24 1231 Oral     SpO2 06/07/24 1231 97 %     Weight 06/07/24 1231 181 lb 3.5 oz (82.2 kg)     Height 06/07/24 1231 5' 2 (1.575 m)     Head Circumference --      Peak Flow --      Pain Score 06/07/24 1225 7     Pain Loc --      Pain Education --      Exclude from Growth Chart --    No data found.  Updated Vital Signs BP 113/74 (BP Location: Left Arm)   Pulse (!) 116   Temp 98.6 F (37 C) (Oral)   Resp 18   Ht 5' 2 (1.575 m)   Wt 82.2 kg   SpO2 97%   BMI 33.15 kg/m   Visual Acuity Right Eye Distance:   Left Eye Distance:   Bilateral Distance:    Right Eye Near:   Left Eye Near:    Bilateral Near:     Physical Exam Constitutional:      General:  She is not in acute distress.    Appearance: She is well-developed and normal weight. She is not ill-appearing or toxic-appearing.  HENT:     Mouth/Throat:     Mouth: Mucous membranes are moist. No oral lesions.     Pharynx: No pharyngeal swelling, oropharyngeal exudate or posterior oropharyngeal erythema.  Cardiovascular:     Rate and Rhythm: Normal rate and regular rhythm.     Heart sounds: Normal heart sounds.  Pulmonary:     Effort: Pulmonary effort is normal.     Breath sounds: Normal breath sounds. No wheezing or rhonchi.  Musculoskeletal:     Cervical back: Normal range of motion and neck supple.  Lymphadenopathy:     Cervical: No cervical adenopathy.  Neurological:     General: No focal deficit present.     Mental Status: She is alert.  Psychiatric:        Mood and Affect: Mood normal.      UC Treatments / Results  Labs (all labs ordered are listed, but only abnormal results are displayed) Labs Reviewed - No data to display  EKG   Radiology DG Chest 2 View Result Date: 06/07/2024 EXAM: 2 VIEW(S) XRAY OF THE CHEST 06/07/2024 12:59:40 PM COMPARISON: Comparison 10/16/2023. CLINICAL HISTORY: cough, left sided chest discomfort FINDINGS: LUNGS AND PLEURA: No focal pulmonary opacity. No pulmonary  edema. No pleural effusion. No pneumothorax. HEART AND MEDIASTINUM: No acute abnormality of the cardiac and mediastinal silhouettes. BONES AND SOFT TISSUES: No acute osseous abnormality. IMPRESSION: 1. No acute cardiopulmonary process. Electronically signed by: Lynwood Seip MD 06/07/2024 01:38 PM EDT RP Workstation: [id number]    Procedures Procedures (including critical care time)  Medications Ordered in UC Medications  alum & mag hydroxide-simeth (MAALOX/MYLANTA) 200-200-20 MG/5ML suspension 30 mL (30 mLs Oral Given 06/07/24 1254)  lidocaine  (XYLOCAINE ) 2 % viscous mouth solution 15 mL (15 mLs Mouth/Throat Given 06/07/24 1254)    Initial Impression / Assessment and Plan / UC Course  I have reviewed the triage vital signs and the nursing notes.  Pertinent labs & imaging results that were available during my care of the patient were reviewed by me and considered in my medical decision making (see chart for details).   Final Clinical Impressions(s) / UC Diagnoses   Final diagnoses:  Acute cough  Sore throat     Discharge Instructions      You were seen today for cough and sore throat.  Your xray was read as normal today.  I have sent out a medication to help with cough.  Please follow up with ENT and your primary care as needed.     ED Prescriptions     Medication Sig Dispense Auth. Provider   chlorpheniramine-HYDROcodone  (TUSSIONEX) 10-8 MG/5ML Take 5 mLs by mouth every 12 (twelve) hours as needed for cough. 50 mL Darral Longs, MD      PDMP not reviewed this encounter.   Darral Longs, MD 06/07/24 [phone]

## 2024-06-07 NOTE — ED Triage Notes (Signed)
 Been having sore throat for 21 days. Seen the doctor 3 times. Latest visit I was put on Nyastin for thrush. Yesterday about 5:30 I was garling and the liquid caught in my throat and my airway is still not completely cleared. - Entered by patient.  Patient having a dry cough and states it feels as though she may have inhaled some of it. Cough has gotten better but continues to have Chest discomfort and SOB from the coughing.

## 2024-06-10 DIAGNOSIS — K143 Hypertrophy of tongue papillae: Secondary | ICD-10-CM | POA: Diagnosis not present

## 2024-06-10 DIAGNOSIS — J4489 Other specified chronic obstructive pulmonary disease: Secondary | ICD-10-CM | POA: Diagnosis not present

## 2024-06-10 DIAGNOSIS — J029 Acute pharyngitis, unspecified: Secondary | ICD-10-CM | POA: Diagnosis not present

## 2024-06-10 NOTE — Progress Notes (Signed)
 Subjective   Cassidy Olsen is a 63 year old female here for Cough (Pt in for persistent cough. She was seen at [facility] last Friday for this cough. Was prescribed hydrocodone  and had 2 xylocaine  while in UC. She states she has appt w/ ENT this week.) and Prescription Refill Request (Refill on albuterol .)  She continues to have sore throat and recent cough associated with swallowing liquids or gargling.  Seen at [facility].  Takes Dexilant  by GI.  Has seen ENT in past for sore throat.  She still has sore throat.  Tongue remains white despite course of Nystatin    Documentation Reviewed by Any User: Tobacco  Allergies  Meds  Med Hx   Surg Hx  Fam Hx  Soc Hx    Current Outpatient Medications  Medication Sig Dispense Refill  . albuterol  HFA 90 mcg/actuation inhaler Inhale 2 Puffs into the lungs every 6 (six) hours as needed for shortness of breath or wheezing for up to 30 days inhale 1 puff (90 mcg) by inhalation route every 6 hours as needed. 1 Each 1  . hydrocodone -chlorpheniramine (TUSSIONEX PENNKINETIC) 10-8 mg/5 mL suspension Take 5 mL by mouth.    SABRA amLODIPine (NORVASC) 2.5 mg tablet Take 1 Tablet by mouth once daily. 90 Tablet 1  . hydroCHLOROthiazide (HYDRODIURIL) 25 mg tablet Take 1 Tablet by mouth once daily. 30 Tablet 1  . lisinopriL 20 mg tablet Take 1 Tablet by mouth 2 (two) times daily. 180 Tablet 1  . nystatin  (MYCOSTATIN ) 100,000 unit/mL suspension Take 5 mL by mouth 4 (four) times daily for 14 days. 140 mL 1  . azithromycin  (ZITHROMAX ) 250 mg tablet Take 2 tablets by the mouth for the first dose, then 1 tablet by mouth for 4 days. 6 Tablet 0  . cetirizine (ZYRTEC) 10 mg tablet Take 1 Tablet by mouth once daily. 90 Tablet 3  . JOURNAVX 50 mg tablet TAKE 2 TABLETS BY MOUTH 30 MINS BEFORE BREAKFAST FOR 1 DAY, THEN 1 TABLET 2 TIMES DAILY FOR 29 DAYS.    SABRA naloxone (NARCAN) 4 mg/actuation nasal spray Place 1 Spray into the nostril(s).    . lubiprostone  (AMITIZA ) 24 mcg capsule Take 1 Capsule by mouth 2  (two) times daily with a meal 60 Capsule 0  . atorvastatin  (LIPITOR) 20 mg tablet Take 20 mg by mouth nightly at bedtime    . timolol  (TIMOPTIC ) 0.5 % ophthalmic solution 1-2 drops eyes at onset and 30 minutes after if needed. Can repeat in 2 hours for headache/migraine.    SABRA estradioL (ESTRACE) 0.01 % (0.1 mg/gram) vaginal cream Place vaginally nightly at bedtime    . fluticasone  furoate-vilanteroL (BREO ELIPTA) 100-25 mcg/dose dsdv Inhale 1 Puff into the lungs daily    . fremanezumab -vfrm (AJOVY  AUTOINJECTOR) 225 mg/1.5 mL atIn Inject 225 mg into the skin    . glycopyrrolate  2 mg tab Take 1 Tablet by mouth 2 (two) times daily    . lidocaine  4 % ptmd Place 1 Patch onto the skin once daily    . traMADoL  (ULTRAM ) 50 mg tablet Take 50 mg by mouth every 6 (six) hours as needed    . celecoxib  (CELEBREX ) 200 mg capsule Take 1 Capsule by mouth 2 (two) times daily 56 Capsule 6  . MOVANTIK  25 mg tab Take 25 mg by mouth    . aspirin/acetaminophen /caffeine (MIGRAINE RELIEF ORAL) Take by mouth 2 (two) times daily    . cholecalciferol, vitamin D3, 25 mcg (1,000 unit) capsule     . BOTOX   200 unit solr injection INJECT 155 UNITS INTRAMUSCULARLY EVERY 12 WEEKS (GIVEN AT MD  OFFICE, DISCARD UNUSED)    . fluticasone  (FLONASE ) 50 mcg/actuation nasal spray Place 2 Sprays in both nostrils once daily spray 2 sprays (100 mcg) in each nostril by intranasal route once daily as needed 16 g 3  . ELDERBERRY Take 1 Capsule by mouth once daily    . dicyclomine  (BENTYL ) 10 mg capsule TAKE 1 TO 2 CAPSULES BY MOUTH EVERY 8 HOURS AS NEEDED    . EPINEPHrine  (EPIPEN ) 0.3 mg/0.3 mL pen injector epinephrine  0.3 mg/0.3 mL injection, auto-injector  USE AS DIRECTED    . gabapentin  (NEURONTIN ) 300 mg capsule 3 (three) times a day    . inhalational spacing device As directed.    . montelukast  (SINGULAIR ) 10 mg tablet daily    . valACYclovir  (VALTREX ) 500 mg tablet Take 500 mg by mouth    . acetaminophen  (TYLENOL  8 HOUR) 650 mg CR  tablet take 2 tablets (1,300 mg) by oral route every 8 hours swallowing whole with water . Do not break, crush, dissolve and/or chew.    . amitriptyline  (ELAVIL ) 50 mg tablet Take 1 Tablet by mouth daily.    . dexlansoprazole  (DEXILANT ) 60 mg dr capsule       Objective   BP 123/85  Pulse (!) 107  Temp 98.5 F (36.9 C)  Ht 5' 2 (1.575 m)  Wt 182 lb (82.6 kg)  LMP  (LMP Unknown)  SpO2 97%  BMI 33.29 kg/m  OB Status Hysterectomy  Smoking Status Never  BSA 1.9 m  Physical Exam Lungs no wheezing Pharynx no erythema Mild right cervical region tenderness Tongue remains white  Assessment and Plan  1. Sore throat Comments: Uncertain etiology, but concerned about GERD affect She will see ENT on Nov 6  2. Chronic asthmatic bronchitis Comments: Refill inhaler - albuterol  HFA 90 mcg/actuation inhaler; Inhale 2 Puffs into the lungs every 6 (six) hours as needed for shortness of breath or wheezing for up to 30 days inhale 1 puff (90 mcg) by inhalation route every 6 hours as needed.  Dispense: 1 Each; Refill: 1  3. Tongue coating Comments: Not responsive to nystatin  ?GERD

## 2024-06-13 ENCOUNTER — Ambulatory Visit: Payer: Self-pay | Admitting: Internal Medicine

## 2024-06-13 DIAGNOSIS — R070 Pain in throat: Secondary | ICD-10-CM | POA: Diagnosis not present

## 2024-06-13 NOTE — Telephone Encounter (Signed)
 FYI Only or Action Required?: FYI only for provider: appointment scheduled on 11/10.  Patient is followed in Pulmonology for Asthma, last seen on 10/24/2023 by Hunsucker, Donnice SAUNDERS, MD.  Called Nurse Triage reporting Sore Throat.  Symptoms began several weeks ago.  Symptoms are: gradually worsening.  Triage Disposition: See PCP When Office is Open (Within 3 Days)  Patient/caregiver understands and will follow disposition?: Yes      Copied from CRM #[id number]. Topic: Clinical - Red Word Triage >> Jun 13, 2024  3:21 PM Abigail D wrote: Red Word that prompted transfer to Nurse Triage: Wheezing/Tightness in Chest: Sore throat for about 3 weeks now, dry cough that comes when she talks. Patient has experienced some wheezing recently. Using inhaler occasionally when needed. Tightness in chest as well.       Reason for Disposition  Cough has been present for > 3 weeks  Answer Assessment - Initial Assessment Questions Patient seen at urgent care for the same and was told to follow up with her pulmonologist.      1. ONSET: When did the throat start hurting? (Hours or days ago)      3 weeks ago  2. SEVERITY: How bad is the sore throat? (Scale 1-10; mild, moderate or severe)     Moderate to severe  3. STREP EXPOSURE: Has there been any exposure to strep within the past week? If Yes, ask: What type of contact occurred?      No 4.  VIRAL SYMPTOMS: Are there any symptoms of a cold, such as a runny nose, cough, hoarse voice or red eyes?      Cough 5. FEVER: Do you have a fever? If Yes, ask: What is your temperature, how was it measured, and when did it start?     No 6. PUS ON THE TONSILS: Is there pus on the tonsils in the back of your throat?     No 7. OTHER SYMPTOMS: Do you have any other symptoms? (e.g., difficulty breathing, headache, rash)     Some shortness of breath with wheezing, mild tightness in chest  Answer Assessment - Initial Assessment  Questions Patient evaluated at urgent care for the same and is seeing ENT today, patient was advised to follow up with pulmonology.     1. ONSET: When did the cough begin?      3 weeks 2. SEVERITY: How bad is the cough today?      Moderate  3. SPUTUM: Describe the color of your sputum (e.g., none, dry cough; clear, white, yellow, green)     No 4. HEMOPTYSIS: Are you coughing up any blood? If Yes, ask: How much? (e.g., flecks, streaks, tablespoons, etc.)     No 5. DIFFICULTY BREATHING: Are you having difficulty breathing? If Yes, ask: How bad is it? (e.g., mild, moderate, severe)      Mild wheezing 6. FEVER: Do you have a fever? If Yes, ask: What is your temperature, how was it measured, and when did it start?     No 7. CARDIAC HISTORY: Do you have any history of heart disease? (e.g., heart attack, congestive heart failure)      Hypertension 8. LUNG HISTORY: Do you have any history of lung disease?  (e.g., pulmonary embolus, asthma, emphysema)     Asthma 9. PE RISK FACTORS: Do you have a history of blood clots? (or: recent major surgery, recent prolonged travel, bedridden)     No 10. OTHER SYMPTOMS: Do you have any other symptoms? (e.g., runny nose,  wheezing, chest pain)       Sore throat, intermittent chest tightness  Protocols used: Sore Throat-A-AH, Cough - Acute Non-Productive-A-AH

## 2024-06-14 NOTE — Telephone Encounter (Signed)
 Noted.  Seeing ENT today (11/7) and Candis at [facility] on 11/10. Nothing further needed.

## 2024-06-17 ENCOUNTER — Encounter: Payer: Self-pay | Admitting: Pulmonary Disease

## 2024-06-17 ENCOUNTER — Other Ambulatory Visit: Payer: Self-pay

## 2024-06-17 ENCOUNTER — Other Ambulatory Visit: Payer: Self-pay | Admitting: Family Medicine

## 2024-06-17 ENCOUNTER — Ambulatory Visit (HOSPITAL_BASED_OUTPATIENT_CLINIC_OR_DEPARTMENT_OTHER)

## 2024-06-17 ENCOUNTER — Ambulatory Visit (INDEPENDENT_AMBULATORY_CARE_PROVIDER_SITE_OTHER)

## 2024-06-17 ENCOUNTER — Encounter (HOSPITAL_BASED_OUTPATIENT_CLINIC_OR_DEPARTMENT_OTHER): Payer: Self-pay

## 2024-06-17 VITALS — BP 114/78 | HR 122 | Temp 99.1°F | Ht 62.0 in | Wt 184.0 lb

## 2024-06-17 DIAGNOSIS — J4489 Other specified chronic obstructive pulmonary disease: Secondary | ICD-10-CM | POA: Diagnosis not present

## 2024-06-17 DIAGNOSIS — R251 Tremor, unspecified: Secondary | ICD-10-CM | POA: Diagnosis not present

## 2024-06-17 DIAGNOSIS — G43709 Chronic migraine without aura, not intractable, without status migrainosus: Secondary | ICD-10-CM

## 2024-06-17 DIAGNOSIS — J4521 Mild intermittent asthma with (acute) exacerbation: Secondary | ICD-10-CM | POA: Diagnosis not present

## 2024-06-17 DIAGNOSIS — M461 Sacroiliitis, not elsewhere classified: Secondary | ICD-10-CM | POA: Diagnosis not present

## 2024-06-17 MED ORDER — ALBUTEROL SULFATE (2.5 MG/3ML) 0.083% IN NEBU: 2.5000 mg | INHALATION_SOLUTION | Freq: Four times a day (QID) | RESPIRATORY_TRACT | 5 refills | Status: DC | PRN

## 2024-06-17 MED ORDER — PREDNISONE 10 MG PO TABS: ORAL_TABLET | ORAL | 0 refills | Status: AC

## 2024-06-17 MED ORDER — BENZONATATE 200 MG PO CAPS: 200.0000 mg | ORAL_CAPSULE | Freq: Three times a day (TID) | ORAL | 1 refills | Status: DC | PRN

## 2024-06-17 NOTE — Progress Notes (Signed)
 @Patient  ID: Cassidy Olsen, female    DOB: 01-08-1961, 63 y.o.   MRN: [id number]  Chief Complaint  Patient presents with   Cough    Acute     Referring provider: Florie Cohen, MD  HPI: Discussed the use of AI scribe software for clinical note transcription with the patient, who gave verbal consent to proceed.  History of Present Illness Cassidy Olsen is a 63 year old female with asthma who presents with a persistent cough and chest tightness.  She started with a sore throat about four weeks ago.   After two weeks without improvement, she was prescribed a Z-Pak, which led to thrush and severe coughing episodes after using Nystatin  liquid, resulting in an EMS visit and urgent care consultation. At urgent care, a chest x-ray was clear, and she was given a Maalox-lidocaine  cocktail for her sore throat. Hydrocodone  cough medicine was prescribed but caused disorientation, and she has been using guaifenesin  without mucus production.  Her cough continues to be nonproductive.  She continues to use her nasal spray and Singulair . She had not used Breo since June, but resumed its use about 5 days ago.  She has a history of asthma, never hospitalized for an asthma attack. She reports some shortness of breath but has not significantly altered her activity level, although she has been staying home due to her symptoms. 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 Her appetite is reduced, and she has been eating less than usual.  She reports low-grade fevers, with the highest being 99.86F, and experiences sweats but not drenching night sweats. She has tested negative for flu, COVID, and strep twice. Her cough started two weeks after the sore throat, described as a 'bad cough' that led her to urgent care. She uses a humidifier at night and notes that smoke exposure exacerbates her symptoms.  Her current medications include Breo,  Singulair , Flonase , Zyrtec, and albuterol , which she has not yet used since it was recently refilled. She has a history of using prednisone , which previously caused high blood pressure and withdrawal symptoms when not tapered. She is currently on a prednisone  taper and is on day five of the taper.  Last OV 10/2023:  63 y.o. history of asthma whom we are seeing for the same.  Multiple pulmonary notes from Dr. Brenna reviewed. Historically well-controlled asthma.  Was only using Breo as needed.  Last seen 05/2022.  Got sick a few weeks ago.  Sounds like viral illness.  Multiple household members similar illness.  Had a lot of congestion wheeze.  This is slowly getting better.  Now has more sinus congestion.  Postnasal drip.  Sinus pressure etc.  Using Flonase .  Seen by ENT last week.  Added ipratropium nasal spray.  No real improvement.  We discussed strategies to help with this.  Continuing Breo.  Was using as needed.  Using daily now for about the last week or so.  Discussed need for possibly escalating inhaler therapies in the future. ACT 23.  TEST/EVENTS :  Chest 2view 06/07/2024:  IMPRESSION: 1. No acute cardiopulmonary process.  Allergies  Allergen Reactions   Aspirin Other (See Comments), Nausea And Vomiting and Nausea Only    Stomach upset Avoids due to IBS Due to ibs Stomach upset Due to ibs Avoids due to IBS   Ketorolac  Hives   Ketorolac  Tromethamine  Shortness Of Breath, Itching and Other (See Comments)    IM administration  ONLY  REDNESS IM administration ONLY  REDNESS  SWELLING OF THE THROAT   ORAL TORADOL  CAUSED EXTREME REDNESS, ITCHINESS, DIFFICULTY BREATHING, AND FACIAL SWELLING. PATIENT ALSO DIAGNOSED WITH BELL'S PALSY.   Penicillins Hives, Itching, Rash and Other (See Comments)    Did it involve swelling of the face/tongue/throat, SOB, or low BP? No Did it involve sudden or severe rash/hives, skin peeling, or any reaction on the inside of your mouth or nose? Yes Did you  need to seek medical attention at a hospital or doctor's office? No When did it last happen?      2000 If all above answers are "NO", may proceed with cephalosporin use.  Vaginal rash Vaginal rash Did it involve swelling of the face/tongue/throat, SOB, or low BP? No Did it involve sudden or severe rash/hives, skin peeling, or any reaction on the inside of your mouth or nose? Yes Did you need to seek medical attention at a hospital or doctor's office? No When did it last happen?      2000 If all above answers are "NO", may proceed with cephalosporin use. Vaginal rash Vaginal rash   Codeine Other (See Comments)    Cough meds with codeine-have withdraw symptoms when done Cough meds with codeine-have withdraw symptoms when done Cough meds with codeine-have withdraw symptoms when done   Ibuprofen Other (See Comments) and Nausea And Vomiting    Stomach upset Avoids due to IBS Stomach upset Avoids due to IBS   Latex Rash and Other (See Comments)    Vaginal irritation Vaginal irritation Exacerbates genital herpes Exacerbates genital herpes   Omeprazole Magnesium Other (See Comments)    Lack of therapeutic effect Lack of therapeutic effect   Pantoprazole  Sodium Other (See Comments)    Lack of therapeutic effect Lack of therapeutic effect   Penicillamine Rash   Pregabalin Other (See Comments)    Weight gain Weight gain   Phenazopyridine      Other Reaction(s): angioedema   Pyridium  [Phenazopyridine  Hcl] Swelling    Lip swelling and burning    Immunization History  Administered Date(s) Administered   Influenza Inj Mdck Quad Pf 05/24/2017, 05/09/2018, 06/17/2022   Influenza Split 05/24/2017, 05/09/2018   Influenza,inj,Quad PF,6+ Mos 05/10/2018, 04/27/2019   Influenza,inj,quad, With Preservative 08/08/2016   Moderna Sars-Covid-2 Vaccination 09/30/2019, 10/28/2019   PFIZER Comirnaty(Gray Top)Covid-19 Tri-Sucrose Vaccine 01/27/2021   PFIZER(Purple Top)SARS-COV-2 Vaccination  10/12/2019, 11/02/2019, 06/24/2020   PNEUMOCOCCAL CONJUGATE-20 12/27/2021   Pfizer(Comirnaty)Fall Seasonal Vaccine 12 years and older 06/17/2022, 06/28/2023   Pneumococcal Conjugate-13 05/23/2017   Tdap 01/11/2017   Unspecified SARS-COV-2 Vaccination 01/27/2021   Zoster Recombinant(Shingrix) 02/16/2021, 10/12/2021    Past Medical History:  Diagnosis Date   ADHD    Arthritis 2010   Asthma    Back pain    Chronic headaches    Chronic pain    Constipation    Edema of both lower extremities    Family history of breast cancer    Family history of prostate cancer    Gallstones 2018   GERD (gastroesophageal reflux disease)    High cholesterol    History of kidney stones    Hx: UTI (urinary tract infection)    Hypertension    IBS (irritable bowel syndrome)    Interstitial cystitis 2012   Joint pain    Kidney problem    Migraine    Occipital neuralgia    Osteoarthritis    PNA (pneumonia) 2018   Posttraumatic muscle contracture    Pre-diabetes    Spinal stenosis  Trigeminal neuralgia     Tobacco History: Social History   Tobacco Use  Smoking Status Never  Smokeless Tobacco Never   Counseling given: Not Answered   Outpatient Medications Prior to Visit  Medication Sig Dispense Refill   ACETAMINOPHEN  PO Take 1,300 mg by mouth 2 (two) times daily.     albuterol  (VENTOLIN  HFA) 108 (90 Base) MCG/ACT inhaler Inhale 2 puffs into the lungs every 6 (six) hours as needed for wheezing or shortness of breath. 8 g 6   AMBULATORY NON FORMULARY MEDICATION 1 tablet 2 (two) times daily. Medication Name: Migrelief     amitriptyline  (ELAVIL ) 50 MG tablet TAKE 1 TABLET BY MOUTH EVERYDAY AT BEDTIME 90 tablet 1   amLODipine (NORVASC) 2.5 MG tablet Take 1 tablet by mouth daily.     atorvastatin  (LIPITOR) 20 MG tablet TAKE 1 TABLET BY MOUTH EVERY DAY 90 tablet 3   botulinum toxin Type A  (BOTOX ) 200 units injection INJECT 155 UNITS INTRAMUSCULARLY EVERY 12 WEEKS (GIVEN AT MD  OFFICE, DISCARD  UNUSED) 1 each 1   celecoxib  (CELEBREX ) 200 MG capsule Take 200 mg by mouth 2 (two) times daily.     cetirizine (ZYRTEC) 10 MG tablet Take 10 mg by mouth daily.     chlorpheniramine-HYDROcodone  (TUSSIONEX) 10-8 MG/5ML Take 5 mLs by mouth every 12 (twelve) hours as needed for cough. 50 mL 0   dexlansoprazole  (DEXILANT ) 60 MG capsule Take 1 capsule (60 mg total) by mouth daily. 90 capsule 3   dicyclomine  (BENTYL ) 10 MG capsule Take 2 capsules (20 mg total) by mouth in the morning and at bedtime. PRN 360 capsule 3   ELDERBERRY PO ELDERBERRY IMMUNE SUPPORT     fluticasone  (FLONASE ) 50 MCG/ACT nasal spray Place 2 sprays into both nostrils daily. 16 g 11   Fremanezumab -vfrm (AJOVY ) 225 MG/1.5ML SOAJ Inject 225 mg into the skin every 30 (thirty) days. 4.5 mL 3   gabapentin  (NEURONTIN ) 300 MG capsule Take 3 capsules (900 mg total) by mouth 3 (three) times daily. 270 capsule 0   glycopyrrolate  (ROBINUL ) 2 MG tablet Take 1 tablet (2 mg total) by mouth 2 (two) times daily. 180 tablet 3   hydrochlorothiazide (HYDRODIURIL) 25 MG tablet Take 25 mg by mouth.     lidocaine  4 % Place 1 patch onto the skin as needed.     lisinopril (ZESTRIL) 20 MG tablet Take 20 mg by mouth 2 (two) times daily.     lubiprostone  (AMITIZA ) 24 MCG capsule TAKE 1 CAPSULE (24 MCG TOTAL) BY MOUTH 2 (TWO) TIMES DAILY WITH A MEAL. 180 capsule 1   MOVANTIK  25 MG TABS tablet TAKE 1 TABLET (25 MG TOTAL) BY MOUTH DAILY. 30 tablet 3   NONFORMULARY OR COMPOUNDED ITEM Place 1 spray into the nose as needed (headache). 4% Lidocaine  nasal spray.   1 spray into nostril on affected headache side if needed for headache. May repeat 1 spray 10 minutes later if needed.     nystatin  (MYCOSTATIN ) 100000 UNIT/ML suspension Take 500,000 Units by mouth.     Omega-3 Fatty Acids (OMEGA III EPA+DHA) 1000 MG CAPS Take by mouth.     Oregano Oil-Flaxseed Oil (OREGANO OIL IMMUNE SUPPORT) 50-25 MG CAPS OREGANO SUPPLEMENT     Spacer/Aero-Holding Chambers  (AEROCHAMBER MV) inhaler Use as instructed 1 each 0   timolol  (TIMOPTIC ) 0.5 % ophthalmic solution 1-2 drops eyes at onset and 30 minutes after if needed. Can repeat in 2 hours for headache/migraine. 10 mL 12   traMADol  (ULTRAM ) 50  MG tablet Take 100 mg by mouth 3 (three) times daily. 2x daily     valACYclovir  (VALTREX ) 500 MG tablet Take 500 mg by mouth 2 (two) times daily as needed (outbreaks).      Vitamin D, Cholecalciferol, 25 MCG (1000 UT) CAPS Take 1 capsule by mouth daily.     montelukast  (SINGULAIR ) 10 MG tablet Take 1 tablet (10 mg total) by mouth at bedtime. 90 tablet 1   montelukast  (SINGULAIR ) 10 MG tablet Take 1 tablet (10 mg total) by mouth at bedtime. 90 tablet 1   No facility-administered medications prior to visit.     Review of Systems:   Constitutional:   No  weight loss, night sweats,  Fevers, chills, fatigue, or  lassitude.  HEENT:   No headaches,  Difficulty swallowing,  Tooth/dental problems, or  Sore throat,                No sneezing, itching, ear ache, nasal congestion, post nasal drip,   CV:  No chest pain,  Orthopnea, PND, swelling in lower extremities, anasarca, dizziness, palpitations, syncope.   GI  No heartburn, indigestion, abdominal pain, nausea, vomiting, diarrhea, change in bowel habits, loss of appetite, bloody stools.   Resp: No shortness of breath with exertion or at rest.  No excess mucus, no productive cough,  No non-productive cough,  No coughing up of blood.  No change in color of mucus.  No wheezing.  No chest wall deformity  Skin: no rash or lesions.  GU: no dysuria, change in color of urine, no urgency or frequency.  No flank pain, no hematuria   MS:  No joint pain or swelling.  No decreased range of motion.  No back pain.    Physical Exam  BP 114/78   Pulse (!) 122   Temp 99.1 F (37.3 C) (Oral)   Ht 5' 2 (1.575 m)   Wt 184 lb (83.5 kg)   SpO2 96%   BMI 33.65 kg/m   GEN: A/Ox3; pleasant , NAD, well nourished.  Voice is  hoarse.   HEENT:  Lynn/AT,  EACs-clear, TMs-wnl, NOSE-clear, THROAT-clear, no lesions, no postnasal drip or exudate noted.   NECK:  Supple w/ fair ROM; no JVD; normal carotid impulses w/o bruits; no thyromegaly or nodules palpated; no lymphadenopathy.    RESP  Clear  P & A; w/o, wheezes/ rales/ or rhonchi. no accessory muscle use, no dullness to percussion  CARD:  RRR, no m/r/g, no peripheral edema, pulses intact, no cyanosis or clubbing.  GI:   Soft & nt; nml bowel sounds; no organomegaly or masses detected.   Musco: Warm bil, no deformities or joint swelling noted.   Neuro: alert, no focal deficits noted.    Skin: Warm, no lesions or rashes    Lab Results:  CBC    Component Value Date/Time   WBC 11.1 (H) 10/16/2023 1437   RBC 4.30 10/16/2023 1437   HGB 14.0 10/16/2023 1437   HGB 13.5 04/26/2022 1050   HCT 42.0 10/16/2023 1437   HCT 40.4 04/26/2022 1050   PLT 340 10/16/2023 1437   PLT 306 04/26/2022 1050   MCV 97.7 10/16/2023 1437   MCV 94 04/26/2022 1050   MCH 32.6 10/16/2023 1437   MCHC 33.3 10/16/2023 1437   RDW 13.0 10/16/2023 1437   RDW 14.3 04/26/2022 1050   LYMPHSABS 3.1 03/31/2023 2057   LYMPHSABS 2.2 04/26/2022 1050   MONOABS 0.6 03/31/2023 2057   EOSABS 0.2 03/31/2023 2057   EOSABS  0.2 04/26/2022 1050   BASOSABS 0.1 03/31/2023 2057   BASOSABS 0.1 04/26/2022 1050    BMET    Component Value Date/Time   NA 138 10/16/2023 1437   NA 138 11/09/2022 1222   K 4.1 10/16/2023 1437   CL 100 10/16/2023 1437   CO2 25 10/16/2023 1437   GLUCOSE 127 (H) 10/16/2023 1437   BUN 15 10/16/2023 1437   BUN 12 11/09/2022 1222   CREATININE 0.81 10/16/2023 1437   CALCIUM  9.4 10/16/2023 1437   GFRNONAA >60 10/16/2023 1437   GFRAA 59 (L) 12/09/2019 1616    BNP No results found for: BNP  ProBNP No results found for: PROBNP  Imaging: DG Chest 2 View Result Date: 06/07/2024 EXAM: 2 VIEW(S) XRAY OF THE CHEST 06/07/2024 12:59:40 PM COMPARISON: Comparison  10/16/2023. CLINICAL HISTORY: cough, left sided chest discomfort FINDINGS: LUNGS AND PLEURA: No focal pulmonary opacity. No pulmonary edema. No pleural effusion. No pneumothorax. HEART AND MEDIASTINUM: No acute abnormality of the cardiac and mediastinal silhouettes. BONES AND SOFT TISSUES: No acute osseous abnormality. IMPRESSION: 1. No acute cardiopulmonary process. Electronically signed by: Lynwood Seip MD 06/07/2024 01:38 PM EDT RP Workstation: [id number]    Administration History     None          Latest Ref Rng & Units 05/03/2019   12:03 PM  PFT Results  FVC-Pre L 3.14   FVC-Predicted Pre % 119   FVC-Post L 3.14   FVC-Predicted Post % 119   Pre FEV1/FVC % % 76   Post FEV1/FCV % % 79   FEV1-Pre L 2.38   FEV1-Predicted Pre % 114   FEV1-Post L 2.49   DLCO uncorrected ml/min/mmHg 21.67   DLCO UNC% % 109   DLVA Predicted % 103   TLC L 6.05   TLC % Predicted % 123   RV % Predicted % 159     No results found for: NITRICOXIDE   Assessment & Plan:   Assessment & Plan Mild intermittent asthma with acute exacerbation  Assessment and Plan Assessment & Plan Asthma with acute exacerbation and post-infectious cough (suspected mycoplasma pneumonia- has already completed treatment with azithromycin ) Symptoms include sore throat, dry cough, chest tightness, and fatigue. Negative for COVID, flu, and strep. Chest x-ray clear. Current symptoms suggest post-infectious sequelae. Discussed Breo and albuterol  use. Nebulizer suggested for ease of use during coughing spells. Prednisone  taper considered. Discussed potential for prolonged cough due to mycoplasma pneumonia. - Continue Breo daily. - Use albuterol  inhaler as needed, especially during coughing spells. - Ordered albuterol  nebulizer solution and nebulizer machine. - Prescribed prednisone  taper starting at 40 mg for 8 days. - Ordered Tessalon  Perles for cough management. - Schedule pulmonary function tests (PFTs) in 6-8 weeks once  she is back at baseline. - Advised rest, fluids, and gradual increase in activity.  Oral candidiasis secondary to antibiotics Oral candidiasis secondary to Z-Pak use. Symptoms include sore throat and difficulty swallowing cold liquids. Improvement noted with current management. - Continue current management and monitor for resolution of symptoms.  Fatigue and decreased appetite Likely secondary to prolonged illness and post-infectious state. Symptoms include reduced energy levels and decreased interest in eating. Improvement noted with increased tolerance to cooler liquids. - Encouraged gradual increase in food intake as tolerated. - Advised rest and gradual increase in activity.     Return in about 8 weeks (around 08/12/2024) for PFT.  Candis Dandy, PA-C 06/17/2024

## 2024-06-17 NOTE — Patient Instructions (Signed)
 Complete Prednisone  taper as prescribed.  Continue Breo daily; continue Albuterol  every 4-6 hours as needed.  Complete PFTs in 8 weeks; follow up in clinic afterwards

## 2024-06-18 ENCOUNTER — Other Ambulatory Visit (HOSPITAL_COMMUNITY): Payer: Self-pay

## 2024-06-18 ENCOUNTER — Encounter (HOSPITAL_BASED_OUTPATIENT_CLINIC_OR_DEPARTMENT_OTHER): Payer: Self-pay

## 2024-06-18 ENCOUNTER — Other Ambulatory Visit: Payer: Self-pay

## 2024-06-18 MED ORDER — BOTOX 200 UNITS IJ SOLR
INTRAMUSCULAR | 1 refills | Status: AC
  Filled 2024-06-18 – 2024-07-01 (×2): qty 1, 84d supply, fill #0

## 2024-06-18 NOTE — Telephone Encounter (Signed)
 Last seen on 04/04/24 Follow up scheduled 07/11/24'

## 2024-06-21 NOTE — Telephone Encounter (Signed)
 Pt called to request which MD she can see to get referral placed she wasn't' sure if she was suppose to see MD Dr. Debby PARAS. Thekkekandam back in may when Dr. Ines was here . However  Pt would like to discuss with Nurse

## 2024-06-24 ENCOUNTER — Telehealth: Payer: Self-pay | Admitting: Neurology

## 2024-06-24 NOTE — Telephone Encounter (Signed)
 Phone room: please call pt back. Let her know nurse reviewed chart and we confirmed she has not been evaluated here for tremors in the past. This would be a new issue. She needs to see PCP first and if they feel she needs to see neurology for new evaluation of this, they should place referral to our office and then we can get her scheduled for this.

## 2024-06-24 NOTE — Telephone Encounter (Signed)
 She can see Dr. Onita- she is also established with Amy.

## 2024-06-24 NOTE — Telephone Encounter (Signed)
 Called pt at [phone]. LVM

## 2024-06-24 NOTE — Telephone Encounter (Signed)
 Call to patient to, she was very rude in the beginning and stated she didn't ask for a nurse call and didn't understand why she needed a new referral. I reviewed our office policy. By the end of call patient was calmer and apologized and verbalzied understanding that referral was needed and if possible to be seen before 11/05/24. She has complaints of tremors in her hands

## 2024-06-24 NOTE — Telephone Encounter (Signed)
 Called pt to schedule a reassignment appt with the new provider and she stated that in the last 3 months she has notice tremors developing. I informed her that if it is a new Dx then she would need a new referral. Pt stated that it should not require a new referral because it is taking place on her R side. Please advise.

## 2024-06-26 NOTE — Telephone Encounter (Signed)
 There was a message about returning call to her about tremors.  I returned call this am and she said someone already told her that she will need to see pcp for referral.

## 2024-06-27 ENCOUNTER — Telehealth: Payer: Self-pay | Admitting: Cardiovascular Disease

## 2024-06-28 MED ORDER — ATORVASTATIN CALCIUM 20 MG PO TABS: 20.0000 mg | ORAL_TABLET | Freq: Every day | ORAL | 0 refills | Status: DC

## 2024-07-01 ENCOUNTER — Other Ambulatory Visit: Payer: Self-pay

## 2024-07-01 NOTE — Progress Notes (Signed)
 Specialty Pharmacy Refill Coordination Note  Cassidy Olsen is a 63 y.o. female assessed today regarding refills of clinic administered specialty medication(s) OnabotulinumtoxinA  (Botox )   Clinic requested Courier to Provider Office   Delivery date: 07/08/24   Verified address: [facility] [phone]   Medication will be filled on: 07/05/24   Copay: $0.00 Appointment: 12.4.25

## 2024-07-02 ENCOUNTER — Telehealth (HOSPITAL_BASED_OUTPATIENT_CLINIC_OR_DEPARTMENT_OTHER): Payer: Self-pay | Admitting: *Deleted

## 2024-07-02 NOTE — Telephone Encounter (Signed)
 CMN received for nebulizer w/supplies signed by provider and faxed confirmation received [id number]

## 2024-07-08 DIAGNOSIS — J4489 Other specified chronic obstructive pulmonary disease: Secondary | ICD-10-CM | POA: Diagnosis not present

## 2024-07-08 DIAGNOSIS — R251 Tremor, unspecified: Secondary | ICD-10-CM | POA: Diagnosis not present

## 2024-07-08 DIAGNOSIS — E669 Obesity, unspecified: Secondary | ICD-10-CM | POA: Diagnosis not present

## 2024-07-08 DIAGNOSIS — M461 Sacroiliitis, not elsewhere classified: Secondary | ICD-10-CM | POA: Diagnosis not present

## 2024-07-08 DIAGNOSIS — J029 Acute pharyngitis, unspecified: Secondary | ICD-10-CM | POA: Diagnosis not present

## 2024-07-08 DIAGNOSIS — R7303 Prediabetes: Secondary | ICD-10-CM | POA: Diagnosis not present

## 2024-07-10 ENCOUNTER — Telehealth: Payer: Self-pay
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
## 2024-07-10 NOTE — Telephone Encounter (Signed)
 Fax received from Dr. Burnetta with [facility] to perform a RT SI JOINT FUSION on patient.  Patient needs surgery clearance. Surgery date is PENDING. Patient was seen on 06/17/24 . Office protocol is a risk assessment can be sent to surgeon if patient has been seen in 60 days or less.   Sending to [place] for risk assessment or recommendations if patient needs to be seen in office prior to surgical procedure.

## 2024-07-11 ENCOUNTER — Encounter: Payer: Self-pay | Admitting: Family Medicine

## 2024-07-11 ENCOUNTER — Ambulatory Visit: Admitting: Family Medicine

## 2024-07-11 DIAGNOSIS — G43709 Chronic migraine without aura, not intractable, without status migrainosus: Secondary | ICD-10-CM

## 2024-07-11 MED ORDER — ONABOTULINUMTOXINA 200 UNITS IJ SOLR
155.0000 [IU] | Freq: Once | INTRAMUSCULAR | Status: AC
  Administered 2024-07-11: 155 [IU] via INTRAMUSCULAR

## 2024-07-11 NOTE — Progress Notes (Signed)
 Botox - 200 units x 1 vial Lot: [id number] Expiration: 01/2026 NDC: 9976-6078-97  Bacteriostatic 0.9% Sodium Chloride - 4 mL  Lot: [id number] Expiration: 06/07/2025 NDC: 9590-8033-97  Dx: H56.290 S/P  Witnessed by Nevelyn Meissner, RMA

## 2024-07-16 NOTE — Telephone Encounter (Signed)
 She was seen last as an acute visit with fevers and exacerbation.  She has an appointment scheduled for later this month for PFTs and follow up.  Clearance can be completed at that appointment.

## 2024-07-17 DIAGNOSIS — M961 Postlaminectomy syndrome, not elsewhere classified: Secondary | ICD-10-CM | POA: Diagnosis not present

## 2024-07-17 DIAGNOSIS — G8929 Other chronic pain: Secondary | ICD-10-CM | POA: Diagnosis not present

## 2024-07-17 DIAGNOSIS — M533 Sacrococcygeal disorders, not elsewhere classified: Secondary | ICD-10-CM | POA: Diagnosis not present

## 2024-07-19 NOTE — Telephone Encounter (Signed)
 Pt is now scheduled for 07/10/24 to have PFT and then appt with [facility]. Will hold in clearance pool until this is complete.

## 2024-07-28 ENCOUNTER — Other Ambulatory Visit: Payer: Self-pay | Admitting: Pulmonary Disease

## 2024-07-28 DIAGNOSIS — J301 Allergic rhinitis due to pollen: Secondary | ICD-10-CM

## 2024-07-30 ENCOUNTER — Encounter: Payer: Self-pay | Admitting: Primary Care

## 2024-07-30 ENCOUNTER — Ambulatory Visit (INDEPENDENT_AMBULATORY_CARE_PROVIDER_SITE_OTHER)

## 2024-07-30 ENCOUNTER — Ambulatory Visit: Admitting: Primary Care

## 2024-07-30 VITALS — BP 126/72 | HR 114 | Temp 97.5°F | Ht 63.0 in | Wt 185.2 lb

## 2024-07-30 DIAGNOSIS — J4521 Mild intermittent asthma with (acute) exacerbation: Secondary | ICD-10-CM | POA: Diagnosis not present

## 2024-07-30 DIAGNOSIS — R609 Edema, unspecified: Secondary | ICD-10-CM | POA: Diagnosis not present

## 2024-07-30 DIAGNOSIS — J45909 Unspecified asthma, uncomplicated: Secondary | ICD-10-CM | POA: Diagnosis not present

## 2024-07-30 DIAGNOSIS — R0602 Shortness of breath: Secondary | ICD-10-CM

## 2024-07-30 LAB — PULMONARY FUNCTION TEST
DL/VA % pred: 101 %
DL/VA: 4.3 ml/min/mmHg/L
DLCO unc % pred: 101 %
DLCO unc: 19.73 ml/min/mmHg
FEF 25-75 Post: 1.46 L/s
FEF 25-75 Pre: 1.86 L/s
FEF2575-%Change-Post: -21 %
FEF2575-%Pred-Post: 66 %
FEF2575-%Pred-Pre: 84 %
FEV1-%Change-Post: -5 %
FEV1-%Pred-Post: 87 %
FEV1-%Pred-Pre: 92 %
FEV1-Post: 2.11 L
FEV1-Pre: 2.23 L
FEV1FVC-%Change-Post: -1 %
FEV1FVC-%Pred-Pre: 100 %
FEV6-%Change-Post: -3 %
FEV6-%Pred-Post: 90 %
FEV6-%Pred-Pre: 94 %
FEV6-Post: 2.74 L
FEV6-Pre: 2.85 L
FEV6FVC-%Change-Post: 0 %
FEV6FVC-%Pred-Post: 103 %
FEV6FVC-%Pred-Pre: 103 %
FVC-%Change-Post: -3 %
FVC-%Pred-Post: 88 %
FVC-%Pred-Pre: 91 %
FVC-Post: 2.76 L
FVC-Pre: 2.86 L
Post FEV1/FVC ratio: 76 %
Post FEV6/FVC ratio: 100 %
Pre FEV1/FVC ratio: 78 %
Pre FEV6/FVC Ratio: 100 %
RV % pred: 127 %
RV: 2.52 L
TLC % pred: 108 %
TLC: 5.32 L

## 2024-07-30 LAB — BRAIN NATRIURETIC PEPTIDE: Pro B Natriuretic peptide (BNP): 6 pg/mL (ref 1.0–100.0)

## 2024-07-30 MED ORDER — FLUTICASONE-SALMETEROL 250-50 MCG/ACT IN AEPB: 1.0000 | INHALATION_SPRAY | Freq: Two times a day (BID) | RESPIRATORY_TRACT | 5 refills | Status: AC

## 2024-07-30 MED ORDER — ATORVASTATIN CALCIUM 20 MG PO TABS: 20.0000 mg | ORAL_TABLET | Freq: Every day | ORAL | 0 refills | Status: AC

## 2024-07-30 NOTE — Patient Instructions (Addendum)
" ° °  VISIT SUMMARY: Today, you were seen for your asthma and fluid retention. We discussed your history of asthma, recent treatments, and current symptoms. You mentioned experiencing wheezing with exertion and fluid retention, particularly in your hands, face, and ankles. We reviewed your current medications and made some recommendations to help manage your symptoms.  YOUR PLAN: -ASTHMA: Asthma is a condition where your airways narrow and swell, making it difficult to breathe. You are experiencing wheezing and chest tightness with exertion. Your lung function is well-managed, and there is no need for further prednisone . Continue using Wixela 250 mcg, one puff twice a day, and use your nebulizer as needed. We encourage you to work on weight loss to improve your respiratory function.  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 We will check your BNP levels to see if your heart is contributing to the fluid retention. If the BNP is elevated, we may start you on a short course of low-dose Lasix (furosemide) 10-20 mg for 5 days. Please monitor your weight and symptoms of fluid retention, and follow up with your primary care doctor for ongoing management.  INSTRUCTIONS: Please follow up with your primary care doctor for ongoing management of fluid retention. Monitor your weight and symptoms of fluid retention. Continue using Wixela as prescribed and use your nebulizer as needed. Work on weight loss to help improve your respiratory function.  Orders: BNP  Follow-up 6 months with Dr. Annella or sooner if needed  "

## 2024-07-30 NOTE — Progress Notes (Signed)
 "  @Patient  ID: Cassidy Olsen, female    DOB: 1960-08-19, 63 y.o.   MRN: [id number]  Chief Complaint  Patient presents with   Asthma    pft    Referring provider: Florie Cohen, MD  HPI: 63 year old female, never smoker. PMH significant for HTN, asthma, allergic rhinitis, Gerd, IBS, degenerative disc disease, obesity, hyperlipidemia, prediabetes.    Previous [facility] pulmonary encounter: History of Present Illness Cassidy Olsen is a 63 year old female with asthma who presents with a persistent cough and chest tightness.  She started with a sore throat about four weeks ago.   After two weeks without improvement, she was prescribed a Z-Pak, which led to thrush and severe coughing episodes after using Nystatin  liquid, resulting in an EMS visit and urgent care consultation. At urgent care, a chest x-ray was clear, and she was given a Maalox-lidocaine  cocktail for her sore throat. Hydrocodone  cough medicine was prescribed but caused disorientation, and she has been using guaifenesin  without mucus production.  Her cough continues to be nonproductive.  She continues to use her nasal spray and Singulair . She had not used Breo since June, but resumed its use about 5 days ago.  She has a history of asthma, never hospitalized for an asthma attack. She reports some shortness of breath but has not significantly altered her activity level, although she has been staying home due to her symptoms. 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 Her appetite is reduced, and she has been eating less than usual.  She reports low-grade fevers, with the highest being 99.51F, and experiences sweats but not drenching night sweats. She has tested negative for flu, COVID, and strep twice. Her cough started two weeks after the sore throat, described as a 'bad cough' that led her to urgent care. She uses a humidifier at night and notes that smoke exposure  exacerbates her symptoms.  Her current medications include Breo, Singulair , Flonase , Zyrtec, and albuterol , which she has not yet used since it was recently refilled. She has a history of using prednisone , which previously caused high blood pressure and withdrawal symptoms when not tapered. She is currently on a prednisone  taper and is on day five of the taper.  Last OV 10/2023:  63 y.o. history of asthma whom we are seeing for the same.  Multiple pulmonary notes from Dr. Brenna reviewed. Historically well-controlled asthma.  Was only using Breo as needed.  Last seen 05/2022.  Got sick a few weeks ago.  Sounds like viral illness.  Multiple household members similar illness.  Had a lot of congestion wheeze.  This is slowly getting better.  Now has more sinus congestion.  Postnasal drip.  Sinus pressure etc.  Using Flonase .  Seen by ENT last week.  Added ipratropium nasal spray.  No real improvement.  We discussed strategies to help with this.  Continuing Breo.  Was using as needed.  Using daily now for about the last week or so.  Discussed need for possibly escalating inhaler therapies in the future. ACT 23.  TEST/EVENTS :  Chest 2view 06/07/2024:  IMPRESSION: 1. No acute cardiopulmonary process.   07/30/2024- Interim hx  Discussed the use of AI scribe software for clinical note transcription with the patient, who gave verbal consent to proceed.  History of Present Illness Cassidy Olsen is a 63 year old female with asthma who presents with exertional wheezing and fluid retention.  Patient of  Dr. Annella, last seen by Candis Dandy on 06/17/24 Followed by our office for moderate persistent asthma   She has a history of asthma and was treated for pneumonia in October. Covid, flu and strep were negative. CXR on 06/07/24 normal. Seen on 06/17/24 for asthma exacerbation along with post infectious cough, suspected mycoplasma pneumonia completed azithromycin  and was given a prednisone  taper used a  nebulizer, which improved her symptoms. However, she has not returned to her baseline due to weight gain from the steroids and experiences wheezing with exertion.  She is currently using Wixela, one puff twice a day, and montelukast  daily. She previously used Symbicort , but insurance changes led to a switch to Breo and then [place].  She reports fluid retention, particularly in her hands and face, and has experienced swelling in her ankles. She was previously on hydrochlorothiazide but discontinued it due to muscle spasms in her feet.   No history of smoking. Her weight has fluctuated due to prednisone  use.   Pulmonary function testing 07/30/2024 >> FVC 2.76 (88%), FEV1 2.11 (87%), ratio 79   Allergies[1]  Immunization History  Administered Date(s) Administered   Influenza Inj Mdck Quad Pf 05/24/2017, 05/09/2018, 06/17/2022   Influenza Split 05/24/2017, 05/09/2018   Influenza,inj,Quad PF,6+ Mos 05/10/2018, 04/27/2019   Influenza,inj,quad, With Preservative 08/08/2016   Moderna Sars-Covid-2 Vaccination 09/30/2019, 10/28/2019   PFIZER Comirnaty(Gray Top)Covid-19 Tri-Sucrose Vaccine 01/27/2021   PFIZER(Purple Top)SARS-COV-2 Vaccination 10/12/2019, 11/02/2019, 06/24/2020   PNEUMOCOCCAL CONJUGATE-20 12/27/2021   Pfizer(Comirnaty)Fall Seasonal Vaccine 12 years and older 06/17/2022, 06/28/2023   Pneumococcal Conjugate-13 05/23/2017   Tdap 01/11/2017   Unspecified SARS-COV-2 Vaccination 01/27/2021   Zoster Recombinant(Shingrix) 02/16/2021, 10/12/2021    Past Medical History:  Diagnosis Date   ADHD    Arthritis 2010   Asthma    Back pain    Chronic headaches    Chronic pain    Constipation    Edema of both lower extremities    Family history of breast cancer    Family history of prostate cancer    Gallstones 2018   GERD (gastroesophageal reflux disease)    High cholesterol    History of kidney stones    Hx: UTI (urinary tract infection)    Hypertension    IBS (irritable bowel  syndrome)    Interstitial cystitis 2012   Joint pain    Kidney problem    Migraine    Occipital neuralgia    Osteoarthritis    PNA (pneumonia) 2018   Posttraumatic muscle contracture    Pre-diabetes    Spinal stenosis    Trigeminal neuralgia     Tobacco History: Tobacco Use History[2] Counseling given: Not Answered   Outpatient Medications Prior to Visit  Medication Sig Dispense Refill   ACETAMINOPHEN  PO Take 1,300 mg by mouth 2 (two) times daily.     albuterol  (VENTOLIN  HFA) 108 (90 Base) MCG/ACT inhaler Inhale 2 puffs into the lungs every 6 (six) hours as needed for wheezing or shortness of breath. 8 g 6   amitriptyline  (ELAVIL ) 50 MG tablet TAKE 1 TABLET BY MOUTH EVERYDAY AT BEDTIME 90 tablet 1   amLODipine (NORVASC) 2.5 MG tablet Take 1 tablet by mouth daily.     atorvastatin  (LIPITOR) 20 MG tablet Take 1 tablet (20 mg total) by mouth daily. 30 tablet 0   botulinum toxin Type A  (BOTOX ) 200 units injection INJECT 155 UNITS INTRAMUSCULARLY EVERY 12 WEEKS (GIVEN AT MD  OFFICE, DISCARD UNUSED) 1 each 1   celecoxib  (CELEBREX ) 200 MG capsule Take  200 mg by mouth 2 (two) times daily.     cetirizine (ZYRTEC) 10 MG tablet Take 10 mg by mouth daily.     dexlansoprazole  (DEXILANT ) 60 MG capsule Take 1 capsule (60 mg total) by mouth daily. 90 capsule 3   dicyclomine  (BENTYL ) 10 MG capsule Take 2 capsules (20 mg total) by mouth in the morning and at bedtime. PRN 360 capsule 3   ELDERBERRY PO ELDERBERRY IMMUNE SUPPORT     fluticasone  (FLONASE ) 50 MCG/ACT nasal spray Place 2 sprays into both nostrils daily. 16 g 11   Fremanezumab -vfrm (AJOVY ) 225 MG/1.5ML SOAJ Inject 225 mg into the skin every 30 (thirty) days. 4.5 mL 3   gabapentin  (NEURONTIN ) 300 MG capsule Take 3 capsules (900 mg total) by mouth 3 (three) times daily. 270 capsule 0   glycopyrrolate  (ROBINUL ) 2 MG tablet Take 1 tablet (2 mg total) by mouth 2 (two) times daily. 180 tablet 3   lidocaine  4 % Place 1 patch onto the skin as  needed.     lisinopril (ZESTRIL) 20 MG tablet Take 20 mg by mouth 2 (two) times daily.     lubiprostone  (AMITIZA ) 24 MCG capsule TAKE 1 CAPSULE (24 MCG TOTAL) BY MOUTH 2 (TWO) TIMES DAILY WITH A MEAL. 180 capsule 1   montelukast  (SINGULAIR ) 10 MG tablet TAKE 1 TABLET BY MOUTH EVERYDAY AT BEDTIME 90 tablet 1   MOVANTIK  25 MG TABS tablet TAKE 1 TABLET (25 MG TOTAL) BY MOUTH DAILY. 30 tablet 3   Omega-3 Fatty Acids (OMEGA III EPA+DHA) 1000 MG CAPS Take by mouth.     Oregano Oil-Flaxseed Oil (OREGANO OIL IMMUNE SUPPORT) 50-25 MG CAPS OREGANO SUPPLEMENT     Spacer/Aero-Holding Chambers (AEROCHAMBER MV) inhaler Use as instructed 1 each 0   timolol  (TIMOPTIC ) 0.5 % ophthalmic solution 1-2 drops eyes at onset and 30 minutes after if needed. Can repeat in 2 hours for headache/migraine. 10 mL 12   traMADol  (ULTRAM ) 50 MG tablet Take 100 mg by mouth 3 (three) times daily. 2x daily     valACYclovir  (VALTREX ) 500 MG tablet Take 500 mg by mouth 2 (two) times daily as needed (outbreaks).      Vitamin D, Cholecalciferol, 25 MCG (1000 UT) CAPS Take 1 capsule by mouth daily.     No facility-administered medications prior to visit.    Review of Systems  Review of Systems   Physical Exam  BP 126/72   Pulse (!) 114   Temp (!) 97.5 F (36.4 C)   Ht 5' 3 (1.6 m)   Wt 185 lb 3.2 oz (84 kg)   SpO2 99% Comment: ra  BMI 32.81 kg/m  Physical Exam Constitutional:      General: She is not in acute distress.    Appearance: Normal appearance. She is well-developed. She is not ill-appearing.  HENT:     Head: Normocephalic and atraumatic.     Mouth/Throat:     Mouth: Mucous membranes are moist.     Pharynx: Oropharynx is clear.  Eyes:     Pupils: Pupils are equal, round, and reactive to light.     Comments: Orbital swelling   Cardiovascular:     Rate and Rhythm: Normal rate and regular rhythm.     Heart sounds: Normal heart sounds. No murmur heard. Pulmonary:     Effort: Pulmonary effort is normal. No  respiratory distress.     Breath sounds: Normal breath sounds. No wheezing or rhonchi.  Musculoskeletal:  General: Normal range of motion.     Cervical back: Normal range of motion and neck supple.     Comments: Swelling in hands  Skin:    General: Skin is warm and dry.     Findings: No erythema or rash.  Neurological:     General: No focal deficit present.     Mental Status: She is alert and oriented to person, place, and time. Mental status is at baseline.  Psychiatric:        Mood and Affect: Mood normal.        Behavior: Behavior normal.        Thought Content: Thought content normal.        Judgment: Judgment normal.     Lab Results:  CBC    Component Value Date/Time   WBC 11.1 (H) 10/16/2023 1437   RBC 4.30 10/16/2023 1437   HGB 14.0 10/16/2023 1437   HGB 13.5 04/26/2022 1050   HCT 42.0 10/16/2023 1437   HCT 40.4 04/26/2022 1050   PLT 340 10/16/2023 1437   PLT 306 04/26/2022 1050   MCV 97.7 10/16/2023 1437   MCV 94 04/26/2022 1050   MCH 32.6 10/16/2023 1437   MCHC 33.3 10/16/2023 1437   RDW 13.0 10/16/2023 1437   RDW 14.3 04/26/2022 1050   LYMPHSABS 3.1 03/31/2023 2057   LYMPHSABS 2.2 04/26/2022 1050   MONOABS 0.6 03/31/2023 2057   EOSABS 0.2 03/31/2023 2057   EOSABS 0.2 04/26/2022 1050   BASOSABS 0.1 03/31/2023 2057   BASOSABS 0.1 04/26/2022 1050    BMET    Component Value Date/Time   NA 138 10/16/2023 1437   NA 138 11/09/2022 1222   K 4.1 10/16/2023 1437   CL 100 10/16/2023 1437   CO2 25 10/16/2023 1437   GLUCOSE 127 (H) 10/16/2023 1437   BUN 15 10/16/2023 1437   BUN 12 11/09/2022 1222   CREATININE 0.81 10/16/2023 1437   CALCIUM  9.4 10/16/2023 1437   GFRNONAA >60 10/16/2023 1437   GFRAA 59 (L) 12/09/2019 1616    BNP No results found for: BNP  ProBNP No results found for: PROBNP  Imaging: No results found.   Assessment & Plan:   1. Asthma, unspecified asthma severity, unspecified whether complicated, unspecified whether  persistent (Primary)   Assessment and Plan Assessment & Plan Asthma Exertional wheezing and chest tightness. Mild obstructive lung disease noted by previous provider. Diffusion capacity is normal. Lungs clear on exam. No need for further prednisone . - Continue Wixela 250 mcg, one puff twice a day. - Use nebulizer as needed. - Encouraged weight loss to improve respiratory function.  Fluid retention  Fluid retention possibly related to mild diastolic dysfunction and recent corticosteroid use. Reports swelling in hands, face, and occasional ankle swelling. Previous echocardiogram showed normal left ventricular function with mild diastolic dysfunction. Recent prednisone  use may have contributed to fluid retention.  BNP will be checked to assess cardiac contribution to fluid retention. - Ordered BNP to assess cardiac contribution to fluid retention. - Will consider adding short course low-dose Lasix (furosemide) 20 mg to take as needed  - Monitor weight and fluid retention symptoms. - Checking BNP and CMET - Follow up with primary care for ongoing management of fluid retention.    Almarie LELON Ferrari, NP 07/30/2024      [1]  Allergies Allergen Reactions   Aspirin Other (See Comments), Nausea And Vomiting and Nausea Only    Stomach upset Avoids due to IBS Due to ibs Stomach upset Due  to ibs Avoids due to IBS   Ketorolac  Hives   Ketorolac  Tromethamine  Shortness Of Breath, Itching and Other (See Comments)    IM administration ONLY  REDNESS IM administration ONLY  REDNESS  SWELLING OF THE THROAT   ORAL TORADOL  CAUSED EXTREME REDNESS, ITCHINESS, DIFFICULTY BREATHING, AND FACIAL SWELLING. PATIENT ALSO DIAGNOSED WITH BELL'S PALSY.   Penicillins Hives, Itching, Rash and Other (See Comments)    Did it involve swelling of the face/tongue/throat, SOB, or low BP? No Did it involve sudden or severe rash/hives, skin peeling, or any reaction on the inside of your mouth or nose? Yes Did  you need to seek medical attention at a hospital or doctor's office? No When did it last happen?      2000 If all above answers are NO, may proceed with cephalosporin use.  Vaginal rash Vaginal rash Did it involve swelling of the face/tongue/throat, SOB, or low BP? No Did it involve sudden or severe rash/hives, skin peeling, or any reaction on the inside of your mouth or nose? Yes Did you need to seek medical attention at a hospital or doctor's office? No When did it last happen?      2000 If all above answers are NO, may proceed with cephalosporin use. Vaginal rash Vaginal rash   Codeine Other (See Comments)    Cough meds with codeine-have withdraw symptoms when done Cough meds with codeine-have withdraw symptoms when done Cough meds with codeine-have withdraw symptoms when done   Ibuprofen Other (See Comments) and Nausea And Vomiting    Stomach upset Avoids due to IBS Stomach upset Avoids due to IBS   Latex Rash and Other (See Comments)    Vaginal irritation Vaginal irritation Exacerbates genital herpes Exacerbates genital herpes   Omeprazole Magnesium Other (See Comments)    Lack of therapeutic effect Lack of therapeutic effect   Pantoprazole  Sodium Other (See Comments)    Lack of therapeutic effect Lack of therapeutic effect   Penicillamine Rash   Pregabalin Other (See Comments)    Weight gain Weight gain   Phenazopyridine      Other Reaction(s): angioedema   Pyridium  [Phenazopyridine  Hcl] Swelling    Lip swelling and burning  [2]  Social History Tobacco Use  Smoking Status Never  Smokeless Tobacco Never   "

## 2024-07-30 NOTE — Patient Instructions (Signed)
 Full pft performed today

## 2024-07-30 NOTE — Telephone Encounter (Signed)
" °*  STAT* If patient is at the pharmacy, call can be transferred to refill team.   1. Which medications need to be refilled? (please list name of each medication and dose if known)   atorvastatin  (LIPITOR) 20 MG tablet   2. Would you like to learn more about the convenience, safety, & potential cost savings by using the [facility]?   3. Are you open to using the [facility] (Type [facility]. ).  4. Which pharmacy/location (including street and city if local pharmacy) is medication to be sent to?  [id number] - [place], [place] - [address]   5. Do they need a 30 day or 90 day supply?   90 day  Patient stated she has 6 tablets left.  Patient has appointment scheduled with Dr. Court on 2/2. "

## 2024-07-30 NOTE — Addendum Note (Signed)
 Addended by: MEMORY DELON POUR on: 07/30/2024 02:47 PM   Modules accepted: Orders

## 2024-07-30 NOTE — Telephone Encounter (Signed)
 Pt scheduled 09/09/24, #50 Atorvastatin  has been sent in to get pt to her appointment.

## 2024-07-30 NOTE — Progress Notes (Signed)
 Full pft performed today

## 2024-08-02 ENCOUNTER — Ambulatory Visit: Payer: Self-pay | Admitting: Primary Care

## 2024-08-02 NOTE — Progress Notes (Signed)
 BNP and Kidney function were normal. If still having shortness of breath we can send in 20mg  to take as needed for leg swelling. Quantity #10

## 2024-08-05 ENCOUNTER — Telehealth: Payer: Self-pay | Admitting: Gastroenterology

## 2024-08-05 ENCOUNTER — Encounter: Payer: Self-pay | Admitting: Gastroenterology

## 2024-08-05 NOTE — Telephone Encounter (Signed)
 Spoke w pt about recent ED visit. Pt states she was informed to take Pepto-Bismol for stomach pain. Pt also stated she is currently experiencing stomach pain and black stool for three days. FU appt scheduled 09/03/2024. Pt requesting advice for care. Please advise thank you.

## 2024-08-05 NOTE — Telephone Encounter (Signed)
 Beth, are you able to add risk assessment to note 07/30/24?   Fax received from Dr. Burnetta with [facility] to perform a RT SI JOINT FUSION on patient.  Patient needs surgery clearance. Surgery date is PENDING. Patient was seen on 06/17/24 . Office protocol is a risk assessment can be sent to surgeon if patient has been seen in 60 days or less

## 2024-08-05 NOTE — Telephone Encounter (Signed)
 Patient has also sent a message through My Chart which has been addressed.

## 2024-08-06 ENCOUNTER — Ambulatory Visit: Admitting: Gastroenterology

## 2024-08-06 ENCOUNTER — Encounter: Payer: Self-pay | Admitting: Gastroenterology

## 2024-08-06 VITALS — BP 120/78 | HR 70 | Ht 63.0 in | Wt 183.2 lb

## 2024-08-06 DIAGNOSIS — K2950 Unspecified chronic gastritis without bleeding: Secondary | ICD-10-CM

## 2024-08-06 DIAGNOSIS — K5909 Other constipation: Secondary | ICD-10-CM | POA: Insufficient documentation

## 2024-08-06 DIAGNOSIS — K581 Irritable bowel syndrome with constipation: Secondary | ICD-10-CM

## 2024-08-06 DIAGNOSIS — K219 Gastro-esophageal reflux disease without esophagitis: Secondary | ICD-10-CM

## 2024-08-06 DIAGNOSIS — R1084 Generalized abdominal pain: Secondary | ICD-10-CM | POA: Insufficient documentation

## 2024-08-06 MED ORDER — SUCRALFATE 1 G PO TABS: 1.0000 g | ORAL_TABLET | Freq: Three times a day (TID) | ORAL | 0 refills | Status: AC

## 2024-08-06 NOTE — Patient Instructions (Signed)
 We have sent the following medications to your pharmacy for you to pick up at your convenience: Carafate  1 gram tablet four times daily 20-30 minutes before meals and at bedtime.   Call or send Mychart message in 1 week with an update.  _______________________________________________________  If your blood pressure at your visit was 140/90 or greater, please contact your primary care physician to follow up on this.  _______________________________________________________  If you are age 75 or older, your body mass index should be between 23-30. Your Body mass index is 32.46 kg/m. If this is out of the aforementioned range listed, please consider follow up with your Primary Care Provider.  If you are age 62 or younger, your body mass index should be between 19-25. Your Body mass index is 32.46 kg/m. If this is out of the aformentioned range listed, please consider follow up with your Primary Care Provider.   ________________________________________________________  The [facility] providers would like to encourage you to use MYCHART to communicate with providers for non-urgent requests or questions.  Due to long hold times on the telephone, sending your provider a message by [facility] may be a faster and more efficient way to get a response.  Please allow 48 business hours for a response.  Please remember that this is for non-urgent requests.  _______________________________________________________  [facility] is using a team-based approach to care.  Your team is made up of your doctor and two to three APPS. Our APPS (Nurse Practitioners and Physician Assistants) work with your physician to ensure care continuity for you. They are fully qualified to address your health concerns and develop a treatment plan. They communicate directly with your gastroenterologist to care for you. Seeing the Advanced Practice Practitioners on your physician's team can help you by facilitating care more  promptly, often allowing for earlier appointments, access to diagnostic testing, procedures, and other specialty referrals.

## 2024-08-06 NOTE — Progress Notes (Signed)
 "    08/06/2024 Cassidy Olsen [id number] 04/16/1961   Discussed the use of AI scribe software for clinical note transcription with the patient, who gave verbal consent to proceed.  History of Present Illness Cassidy Olsen is a 63 year old female with chronic gastritis and chronic constipation who presents with persistent abdominal pain.  She is a patient of Dr. Trenna.  Abdominal pain began on Christmas Eve, initially perceived as gas and unrelieved by Gaviscon. Described as cramping with a sensation of something creeping across the inside of her stomach and a brush burn quality. Discomfort is located from the belly button level and above, involving the whole middle of the stomach, rated 5/10 in severity. Pain worsens with eating and when lying down, and has persisted without improvement since onset.  On December 26th, she was evaluated at urgent care and prescribed Zofran , which she did not take due to absence of nausea. She was advised to take Pepto Bismol and follow a bland diet, which she has done. Stools have turned completely black, which she attributes to Pepto Bismol. Denies nausea, regurgitation, or heartburn. Bowel movements occur daily, consistent with her baseline as long as she sticks with her Movantik  and Amitiza .  History of longstanding stomach problems and gastritis, with similar but more severe episodes in the past. Previously treated for gastritis while living in [place] , though specific medication is not recalled.  Is on Dexilant  60 mg daily.  Has tried her dicyclomine  and glycopyrrolate  without improvement.  Current medications include Movantik  and Amitiza  for constipation, and Dexilant  60 mg daily for gastric protection. Previously tried glycopyrrolate  (Robinul ) without benefit, experiencing only dry mouth. Usual stomach medications, which typically provide relief, have not been effective during this episode.    Past Medical History:  Diagnosis Date   ADHD     Arthritis 2010   Asthma    Back pain    Chronic headaches    Chronic pain    Constipation    Edema of both lower extremities    Family history of breast cancer    Family history of prostate cancer    Gallstones 2018   GERD (gastroesophageal reflux disease)    High cholesterol    History of kidney stones    Hx: UTI (urinary tract infection)    Hypertension    IBS (irritable bowel syndrome)    Interstitial cystitis 2012   Joint pain    Kidney problem    Migraine    Occipital neuralgia    Osteoarthritis    PNA (pneumonia) 2018   Posttraumatic muscle contracture    Pre-diabetes    Spinal stenosis    Trigeminal neuralgia    Past Surgical History:  Procedure Laterality Date   ABDOMINAL HYSTERECTOMY     ANKLE SURGERY Left 05/2009   APPENDECTOMY     Benign Tumor Removal Right 01/26/2017   right upper arm by Dr. Dale Melia at [facility] [place]    BRAIN SURGERY     CARPAL TUNNEL RELEASE Right 2014   CHOLECYSTECTOMY N/A 12/24/2020   Procedure: LAPAROSCOPIC CHOLECYSTECTOMY;  Surgeon: Vernetta Berg, MD;  Location: [facility] OR;  Service: General;  Laterality: N/A;   COLONOSCOPY     CYSTOSCOPY W/ URETERAL STENT PLACEMENT Left 07/25/2019   Procedure: CYSTOSCOPY WITH RETROGRADE PYELOGRAM/URETERAL STENT PLACEMENT;  Surgeon: Matilda Senior, MD;  Location: [facility] ORS;  Service: Urology;  Laterality: Left;   CYSTOSCOPY W/ URETERAL STENT REMOVAL Left 08/15/2019   Procedure: CYSTOSCOPY WITH STENT REMOVAL;  Surgeon: Matilda Senior, MD;  Location: [facility];  Service: Urology;  Laterality: Left;   CYSTOSCOPY WITH RETROGRADE PYELOGRAM, URETEROSCOPY AND STENT PLACEMENT Left 08/15/2019   Procedure: CYSTOSCOPY WITH RETROGRADE PYELOGRAM, URETEROSCOPY;  Surgeon: Matilda Senior, MD;  Location: [facility];  Service: Urology;  Laterality: Left;  90 MINS   CYSTOSCOPY WITH RETROGRADE PYELOGRAM, URETEROSCOPY AND STENT PLACEMENT Right 09/12/2019   Procedure: CYSTOSCOPY WITH RIGHT RETROGRADE  PYELOGRAM  URETEROSCOPY WITH HOLMIUM LASER STONE EXTRACTION AND STENT PLACEMENT;  Surgeon: Matilda Senior, MD;  Location: [facility];  Service: Urology;  Laterality: Right;   DILATION AND CURETTAGE OF UTERUS     HOLMIUM LASER APPLICATION Left 08/15/2019   Procedure: HOLMIUM LASER APPLICATION;  Surgeon: Matilda Senior, MD;  Location: [facility];  Service: Urology;  Laterality: Left;   JOINT REPLACEMENT     R knee   KIDNEY STONE SURGERY     neuro spine similator  01/2021   OVARIAN CYST REMOVAL  05/2010   right knee revision  2016   ROTATOR CUFF REPAIR Right 02/2019   SPINE SURGERY     x 3   TUBAL LIGATION      reports that she has never smoked. She has never used smokeless tobacco. She reports current alcohol use. She reports that she does not use drugs. family history includes Alzheimer's disease in her sister; Breast cancer in her paternal grandmother; Breast cancer (age of onset: 35) in her sister; Cancer in her paternal uncle; Celiac disease in her sister; Depression in her mother; Diabetes in her paternal grandmother; Heart disease in her father; High blood pressure in her father and mother; Hypertension in her sister; Irritable bowel syndrome in her father and mother; Kidney disease in her sister; Prostate cancer (age of onset: 38) in her father; Sudden death in her mother; Thyroid  disease in her sister; Ulcers (age of onset: 32) in her mother. Allergies[1]    Outpatient Encounter Medications as of 08/06/2024  Medication Sig   ACETAMINOPHEN  PO Take 1,300 mg by mouth 2 (two) times daily.   albuterol  (VENTOLIN  HFA) 108 (90 Base) MCG/ACT inhaler Inhale 2 puffs into the lungs every 6 (six) hours as needed for wheezing or shortness of breath.   amitriptyline  (ELAVIL ) 50 MG tablet TAKE 1 TABLET BY MOUTH EVERYDAY AT BEDTIME   amLODipine (NORVASC) 2.5 MG tablet Take 1 tablet by mouth daily.   atorvastatin  (LIPITOR) 20 MG tablet Take 1 tablet (20 mg total) by mouth daily.   botulinum toxin Type  A (BOTOX ) 200 units injection INJECT 155 UNITS INTRAMUSCULARLY EVERY 12 WEEKS (GIVEN AT MD  OFFICE, DISCARD UNUSED)   celecoxib  (CELEBREX ) 200 MG capsule Take 200 mg by mouth 2 (two) times daily.   cetirizine (ZYRTEC) 10 MG tablet Take 10 mg by mouth daily.   dexlansoprazole  (DEXILANT ) 60 MG capsule Take 1 capsule (60 mg total) by mouth daily.   dicyclomine  (BENTYL ) 10 MG capsule Take 2 capsules (20 mg total) by mouth in the morning and at bedtime. PRN   ELDERBERRY PO ELDERBERRY IMMUNE SUPPORT   fluticasone  (FLONASE ) 50 MCG/ACT nasal spray Place 2 sprays into both nostrils daily.   fluticasone -salmeterol (WIXELA INHUB) 250-50 MCG/ACT AEPB Inhale 1 puff into the lungs in the morning and at bedtime.   Fremanezumab -vfrm (AJOVY ) 225 MG/1.5ML SOAJ Inject 225 mg into the skin every 30 (thirty) days.   gabapentin  (NEURONTIN ) 300 MG capsule Take 3 capsules (900 mg total) by mouth 3 (three) times daily.   glycopyrrolate  (ROBINUL ) 2 MG tablet Take 1 tablet (2 mg total) by  mouth 2 (two) times daily.   lidocaine  4 % Place 1 patch onto the skin as needed.   lisinopril (ZESTRIL) 20 MG tablet Take 20 mg by mouth 2 (two) times daily.   lubiprostone  (AMITIZA ) 24 MCG capsule TAKE 1 CAPSULE (24 MCG TOTAL) BY MOUTH 2 (TWO) TIMES DAILY WITH A MEAL.   montelukast  (SINGULAIR ) 10 MG tablet TAKE 1 TABLET BY MOUTH EVERYDAY AT BEDTIME   MOVANTIK  25 MG TABS tablet TAKE 1 TABLET (25 MG TOTAL) BY MOUTH DAILY.   Omega-3 Fatty Acids (OMEGA III EPA+DHA) 1000 MG CAPS Take by mouth.   timolol  (TIMOPTIC ) 0.5 % ophthalmic solution 1-2 drops eyes at onset and 30 minutes after if needed. Can repeat in 2 hours for headache/migraine.   traMADol  (ULTRAM ) 50 MG tablet Take 100 mg by mouth 3 (three) times daily. 2x daily   valACYclovir  (VALTREX ) 500 MG tablet Take 500 mg by mouth 2 (two) times daily as needed (outbreaks).    Vitamin D, Cholecalciferol, 25 MCG (1000 UT) CAPS Take 1 capsule by mouth daily.   Oregano Oil-Flaxseed Oil  (OREGANO OIL IMMUNE SUPPORT) 50-25 MG CAPS OREGANO SUPPLEMENT (Patient not taking: Reported on 08/06/2024)   Spacer/Aero-Holding Chambers (AEROCHAMBER MV) inhaler Use as instructed (Patient not taking: Reported on 08/06/2024)   No facility-administered encounter medications on file as of 08/06/2024.    REVIEW OF SYSTEMS  : All other systems reviewed and negative except where noted in the History of Present Illness.   PHYSICAL EXAM: BP 120/78   Pulse 70   Ht 5' 3 (1.6 m)   Wt 183 lb 4 oz (83.1 kg)   BMI 32.46 kg/m  General: Well developed female in no acute distress Head: Normocephalic and atraumatic Eyes:  Sclerae anicteric, conjunctiva pink. Ears: Normal auditory acuity Lungs: Clear throughout to auscultation; no W/R/R. Heart: Regular rate and rhythm; no M/R/G. Abdomen: Soft, non-distended.  BS present.  Mild diffuse mid-abdominal TTP. Musculoskeletal: Symmetrical with no gross deformities  Skin: No lesions on visible extremities Extremities: No edema  Neurological: Alert oriented x 4, grossly non-focal Psychological:  Alert and cooperative. Normal mood and affect  Assessment & Plan Chronic gastritis She is experiencing persistent mid to upper abdominal pain since lat week, similar to but less severe than prior episodes, unresponsive to her usual medications. She denies nausea or heartburn and has no acute alarming features. - Prescribed sucralfate  4 times daily for 1 week. - Instructed her to notify the office via MyChart if symptoms do not improve or worsen. - Advised that a CT scan may be considered if no improvement after 1 week to rule out other pathology. - Instructed her to continue current Dexilant  regimen.  Chronic constipation - Instructed her to continue Movantik  and Amitiza  as they are currently working well.   CC:  Florie Cohen, MD        [1]  Allergies Allergen Reactions   Aspirin Other (See Comments), Nausea And Vomiting and Nausea Only     Stomach upset Avoids due to IBS Due to ibs Stomach upset Due to ibs Avoids due to IBS   Ketorolac  Hives   Ketorolac  Tromethamine  Shortness Of Breath, Itching and Other (See Comments)    IM administration ONLY  REDNESS IM administration ONLY  REDNESS  SWELLING OF THE THROAT   ORAL TORADOL  CAUSED EXTREME REDNESS, ITCHINESS, DIFFICULTY BREATHING, AND FACIAL SWELLING. PATIENT ALSO DIAGNOSED WITH BELL'S PALSY.   Penicillins Hives, Itching, Rash and Other (See Comments)    Did it involve swelling of  the face/tongue/throat, SOB, or low BP? No Did it involve sudden or severe rash/hives, skin peeling, or any reaction on the inside of your mouth or nose? Yes Did you need to seek medical attention at a hospital or doctor's office? No When did it last happen?      2000 If all above answers are NO, may proceed with cephalosporin use.  Vaginal rash Vaginal rash Did it involve swelling of the face/tongue/throat, SOB, or low BP? No Did it involve sudden or severe rash/hives, skin peeling, or any reaction on the inside of your mouth or nose? Yes Did you need to seek medical attention at a hospital or doctor's office? No When did it last happen?      2000 If all above answers are NO, may proceed with cephalosporin use. Vaginal rash Vaginal rash   Codeine Other (See Comments)    Cough meds with codeine-have withdraw symptoms when done Cough meds with codeine-have withdraw symptoms when done Cough meds with codeine-have withdraw symptoms when done   Ibuprofen Other (See Comments) and Nausea And Vomiting    Stomach upset Avoids due to IBS Stomach upset Avoids due to IBS   Latex Rash and Other (See Comments)    Vaginal irritation Vaginal irritation Exacerbates genital herpes Exacerbates genital herpes   Omeprazole Magnesium Other (See Comments)    Lack of therapeutic effect Lack of therapeutic effect   Pantoprazole  Sodium Other (See Comments)    Lack of therapeutic effect Lack of  therapeutic effect   Penicillamine Rash   Pregabalin Other (See Comments)    Weight gain Weight gain   Phenazopyridine      Other Reaction(s): angioedema   Pyridium  [Phenazopyridine  Hcl] Swelling    Lip swelling and burning   "

## 2024-08-08 NOTE — Telephone Encounter (Signed)
 Can we call her and ask how she is doing. BNP and kidney function were normal. I had sent triage message to call and review lab results. If still having shortness of breath and swelling we could send in furosemide 20mg  every day x 5 days and advised she follow up with PCP for ongoing manage of fluid retention  She will be low to intermediate risk for prolonged mechanical ventilation or post op pulmonary complication due to asthma, she has mild obstruction on pulmonary function testing. Optimized for surgery from pulmonary standpoint.

## 2024-08-11 ENCOUNTER — Emergency Department (HOSPITAL_BASED_OUTPATIENT_CLINIC_OR_DEPARTMENT_OTHER): Admitting: Radiology

## 2024-08-11 ENCOUNTER — Other Ambulatory Visit: Payer: Self-pay

## 2024-08-11 ENCOUNTER — Inpatient Hospital Stay (HOSPITAL_BASED_OUTPATIENT_CLINIC_OR_DEPARTMENT_OTHER)
Admission: EM | Admit: 2024-08-11 | Discharge: 2024-08-16 | DRG: 872 | Disposition: A | Discharge status: Home / Self Care | Attending: Student | Admitting: Student

## 2024-08-11 ENCOUNTER — Encounter (HOSPITAL_BASED_OUTPATIENT_CLINIC_OR_DEPARTMENT_OTHER): Payer: Self-pay

## 2024-08-11 ENCOUNTER — Emergency Department (HOSPITAL_BASED_OUTPATIENT_CLINIC_OR_DEPARTMENT_OTHER)

## 2024-08-11 DIAGNOSIS — A419 Sepsis, unspecified organism: Principal | ICD-10-CM | POA: Diagnosis present

## 2024-08-11 DIAGNOSIS — M5416 Radiculopathy, lumbar region: Secondary | ICD-10-CM | POA: Diagnosis not present

## 2024-08-11 DIAGNOSIS — Z6832 Body mass index (BMI) 32.0-32.9, adult: Secondary | ICD-10-CM | POA: Diagnosis not present

## 2024-08-11 DIAGNOSIS — E669 Obesity, unspecified: Secondary | ICD-10-CM | POA: Diagnosis not present

## 2024-08-11 DIAGNOSIS — A6009 Herpesviral infection of other urogenital tract: Secondary | ICD-10-CM | POA: Diagnosis present

## 2024-08-11 DIAGNOSIS — M5116 Intervertebral disc disorders with radiculopathy, lumbar region: Secondary | ICD-10-CM | POA: Diagnosis present

## 2024-08-11 DIAGNOSIS — R6510 Systemic inflammatory response syndrome (SIRS) of non-infectious origin without acute organ dysfunction: Secondary | ICD-10-CM | POA: Diagnosis not present

## 2024-08-11 DIAGNOSIS — K581 Irritable bowel syndrome with constipation: Secondary | ICD-10-CM | POA: Diagnosis present

## 2024-08-11 DIAGNOSIS — M25561 Pain in right knee: Secondary | ICD-10-CM | POA: Diagnosis present

## 2024-08-11 DIAGNOSIS — E785 Hyperlipidemia, unspecified: Secondary | ICD-10-CM | POA: Diagnosis present

## 2024-08-11 DIAGNOSIS — Z1152 Encounter for screening for COVID-19: Secondary | ICD-10-CM

## 2024-08-11 DIAGNOSIS — Z8249 Family history of ischemic heart disease and other diseases of the circulatory system: Secondary | ICD-10-CM | POA: Diagnosis not present

## 2024-08-11 DIAGNOSIS — I10 Essential (primary) hypertension: Secondary | ICD-10-CM | POA: Diagnosis present

## 2024-08-11 DIAGNOSIS — M5481 Occipital neuralgia: Secondary | ICD-10-CM | POA: Diagnosis present

## 2024-08-11 DIAGNOSIS — Z79899 Other long term (current) drug therapy: Secondary | ICD-10-CM

## 2024-08-11 DIAGNOSIS — E7800 Pure hypercholesterolemia, unspecified: Secondary | ICD-10-CM | POA: Diagnosis present

## 2024-08-11 DIAGNOSIS — Z538 Procedure and treatment not carried out for other reasons: Secondary | ICD-10-CM | POA: Diagnosis present

## 2024-08-11 DIAGNOSIS — Z885 Allergy status to narcotic agent status: Secondary | ICD-10-CM

## 2024-08-11 DIAGNOSIS — K219 Gastro-esophageal reflux disease without esophagitis: Secondary | ICD-10-CM | POA: Diagnosis present

## 2024-08-11 DIAGNOSIS — Z9049 Acquired absence of other specified parts of digestive tract: Secondary | ICD-10-CM

## 2024-08-11 DIAGNOSIS — G8929 Other chronic pain: Secondary | ICD-10-CM | POA: Diagnosis present

## 2024-08-11 DIAGNOSIS — Z9851 Tubal ligation status: Secondary | ICD-10-CM

## 2024-08-11 DIAGNOSIS — L03115 Cellulitis of right lower limb: Secondary | ICD-10-CM | POA: Diagnosis present

## 2024-08-11 DIAGNOSIS — Z96651 Presence of right artificial knee joint: Secondary | ICD-10-CM | POA: Diagnosis present

## 2024-08-11 DIAGNOSIS — F32A Depression, unspecified: Secondary | ICD-10-CM | POA: Diagnosis present

## 2024-08-11 DIAGNOSIS — R509 Fever, unspecified: Principal | ICD-10-CM

## 2024-08-11 DIAGNOSIS — Z886 Allergy status to analgesic agent status: Secondary | ICD-10-CM | POA: Diagnosis not present

## 2024-08-11 DIAGNOSIS — G43909 Migraine, unspecified, not intractable, without status migrainosus: Secondary | ICD-10-CM | POA: Diagnosis present

## 2024-08-11 DIAGNOSIS — Z880 Allergy status to penicillin: Secondary | ICD-10-CM

## 2024-08-11 DIAGNOSIS — E66811 Obesity, class 1: Secondary | ICD-10-CM | POA: Diagnosis present

## 2024-08-11 DIAGNOSIS — M25469 Effusion, unspecified knee: Secondary | ICD-10-CM | POA: Diagnosis not present

## 2024-08-11 DIAGNOSIS — Z833 Family history of diabetes mellitus: Secondary | ICD-10-CM

## 2024-08-11 DIAGNOSIS — Z981 Arthrodesis status: Secondary | ICD-10-CM

## 2024-08-11 DIAGNOSIS — E782 Mixed hyperlipidemia: Secondary | ICD-10-CM | POA: Diagnosis not present

## 2024-08-11 DIAGNOSIS — G500 Trigeminal neuralgia: Secondary | ICD-10-CM | POA: Diagnosis present

## 2024-08-11 DIAGNOSIS — Z888 Allergy status to other drugs, medicaments and biological substances status: Secondary | ICD-10-CM

## 2024-08-11 DIAGNOSIS — Z91040 Latex allergy status: Secondary | ICD-10-CM

## 2024-08-11 DIAGNOSIS — M25552 Pain in left hip: Secondary | ICD-10-CM | POA: Diagnosis not present

## 2024-08-11 DIAGNOSIS — M25461 Effusion, right knee: Secondary | ICD-10-CM | POA: Diagnosis not present

## 2024-08-11 DIAGNOSIS — M79661 Pain in right lower leg: Secondary | ICD-10-CM | POA: Diagnosis not present

## 2024-08-11 DIAGNOSIS — R7401 Elevation of levels of liver transaminase levels: Secondary | ICD-10-CM | POA: Diagnosis not present

## 2024-08-11 LAB — CBC WITH DIFFERENTIAL/PLATELET
Abs Immature Granulocytes: 0.06 K/uL (ref 0.00–0.07)
Basophils Absolute: 0.1 K/uL (ref 0.0–0.1)
Basophils Relative: 1 %
Eosinophils Absolute: 0.1 K/uL (ref 0.0–0.5)
Eosinophils Relative: 1 %
HCT: 38.4 % (ref 36.0–46.0)
Hemoglobin: 13.2 g/dL (ref 12.0–15.0)
Immature Granulocytes: 1 %
Lymphocytes Relative: 17 %
Lymphs Abs: 2.3 K/uL (ref 0.7–4.0)
MCH: 32.6 pg (ref 26.0–34.0)
MCHC: 34.4 g/dL (ref 30.0–36.0)
MCV: 94.8 fL (ref 80.0–100.0)
Monocytes Absolute: 1.1 K/uL — ABNORMAL HIGH (ref 0.1–1.0)
Monocytes Relative: 8 %
Neutro Abs: 9.7 K/uL — ABNORMAL HIGH (ref 1.7–7.7)
Neutrophils Relative %: 72 %
Platelets: 349 K/uL (ref 150–400)
RBC: 4.05 MIL/uL (ref 3.87–5.11)
RDW: 13.4 % (ref 11.5–15.5)
WBC: 13.3 K/uL — ABNORMAL HIGH (ref 4.0–10.5)
nRBC: 0 % (ref 0.0–0.2)

## 2024-08-11 LAB — COMPREHENSIVE METABOLIC PANEL WITH GFR
ALT: 45 U/L — ABNORMAL HIGH (ref 0–44)
AST: 60 U/L — ABNORMAL HIGH (ref 15–41)
Albumin: 4.1 g/dL (ref 3.5–5.0)
Alkaline Phosphatase: 88 U/L (ref 38–126)
Anion gap: 12 (ref 5–15)
BUN: 9 mg/dL (ref 8–23)
CO2: 27 mmol/L (ref 22–32)
Calcium: 9.9 mg/dL (ref 8.9–10.3)
Chloride: 100 mmol/L (ref 98–111)
Creatinine, Ser: 0.83 mg/dL (ref 0.44–1.00)
GFR, Estimated: >60 mL/min
Glucose, Bld: 94 mg/dL (ref 70–99)
Potassium: 3.7 mmol/L (ref 3.5–5.1)
Sodium: 138 mmol/L (ref 135–145)
Total Bilirubin: 0.7 mg/dL (ref 0.0–1.2)
Total Protein: 7.3 g/dL (ref 6.5–8.1)

## 2024-08-11 LAB — URINALYSIS, ROUTINE W REFLEX MICROSCOPIC
Bilirubin Urine: NEGATIVE
Glucose, UA: NEGATIVE mg/dL
Hgb urine dipstick: NEGATIVE
Ketones, ur: NEGATIVE mg/dL
Leukocytes,Ua: NEGATIVE
Nitrite: NEGATIVE
Protein, ur: NEGATIVE mg/dL
Specific Gravity, Urine: 1.01 (ref 1.005–1.030)
pH: 8 (ref 5.0–8.0)

## 2024-08-11 LAB — LACTIC ACID, PLASMA: Lactic Acid, Venous: 0.9 mmol/L (ref 0.5–1.9)

## 2024-08-11 LAB — RESP PANEL BY RT-PCR (RSV, FLU A&B, COVID)  RVPGX2
Influenza A by PCR: NEGATIVE
Influenza B by PCR: NEGATIVE
Resp Syncytial Virus by PCR: NEGATIVE
SARS Coronavirus 2 by RT PCR: NEGATIVE

## 2024-08-11 LAB — SEDIMENTATION RATE: Sed Rate: 49 mm/h — ABNORMAL HIGH (ref 0–22)

## 2024-08-11 MED ORDER — HYDRALAZINE HCL 20 MG/ML IJ SOLN: 5.0000 mg | INTRAMUSCULAR | Status: DC | PRN

## 2024-08-11 MED ORDER — ACETAMINOPHEN 325 MG PO TABS
650.0000 mg | ORAL_TABLET | Freq: Four times a day (QID) | ORAL | Status: DC | PRN
  Administered 2024-08-12 – 2024-08-13 (×3): 650 mg via ORAL
  Filled 2024-08-11 (×3): qty 2

## 2024-08-11 MED ORDER — FLUTICASONE FUROATE-VILANTEROL 200-25 MCG/ACT IN AEPB
1.0000 | INHALATION_SPRAY | Freq: Every day | RESPIRATORY_TRACT | Status: DC
  Administered 2024-08-12 – 2024-08-16 (×5): 1 via RESPIRATORY_TRACT
  Filled 2024-08-11: qty 28

## 2024-08-11 MED ORDER — LISINOPRIL 10 MG PO TABS: 20.0000 mg | ORAL_TABLET | Freq: Two times a day (BID) | ORAL | Status: DC

## 2024-08-11 MED ORDER — GABAPENTIN 300 MG PO CAPS
900.0000 mg | ORAL_CAPSULE | Freq: Three times a day (TID) | ORAL | Status: DC
  Administered 2024-08-11 – 2024-08-16 (×15): 900 mg via ORAL
  Filled 2024-08-11 (×15): qty 3

## 2024-08-11 MED ORDER — CELECOXIB 200 MG PO CAPS
200.0000 mg | ORAL_CAPSULE | Freq: Two times a day (BID) | ORAL | Status: DC
  Administered 2024-08-13 – 2024-08-16 (×5): 200 mg via ORAL
  Filled 2024-08-11 (×10): qty 1

## 2024-08-11 MED ORDER — TRAMADOL HCL 50 MG PO TABS
100.0000 mg | ORAL_TABLET | Freq: Three times a day (TID) | ORAL | Status: DC
  Administered 2024-08-11 – 2024-08-16 (×15): 100 mg via ORAL
  Filled 2024-08-11 (×15): qty 2

## 2024-08-11 MED ORDER — LISINOPRIL 20 MG PO TABS: 20.0000 mg | ORAL_TABLET | Freq: Two times a day (BID) | ORAL | Status: DC

## 2024-08-11 MED ORDER — HEPARIN SODIUM (PORCINE) 5000 UNIT/ML IJ SOLN
5000.0000 [IU] | Freq: Three times a day (TID) | INTRAMUSCULAR | Status: DC
  Administered 2024-08-11 – 2024-08-16 (×14): 5000 [IU] via SUBCUTANEOUS
  Filled 2024-08-11 (×14): qty 1

## 2024-08-11 MED ORDER — VANCOMYCIN HCL 500 MG IV SOLR
500.0000 mg | Freq: Once | INTRAVENOUS | Status: AC
  Administered 2024-08-11: 500 mg via INTRAVENOUS

## 2024-08-11 MED ORDER — ACETAMINOPHEN 500 MG PO TABS
1000.0000 mg | ORAL_TABLET | Freq: Once | ORAL | Status: AC
  Administered 2024-08-11: 1000 mg via ORAL
  Filled 2024-08-11: qty 2

## 2024-08-11 MED ORDER — VANCOMYCIN HCL 1500 MG/300ML IV SOLN
1500.0000 mg | INTRAVENOUS | Status: DC
  Administered 2024-08-12 – 2024-08-15 (×4): 1500 mg via INTRAVENOUS
  Filled 2024-08-11 (×6): qty 300

## 2024-08-11 MED ORDER — LACTATED RINGERS IV BOLUS
500.0000 mL | Freq: Once | INTRAVENOUS | Status: AC
  Administered 2024-08-11: 500 mL via INTRAVENOUS

## 2024-08-11 MED ORDER — NALOXEGOL OXALATE 25 MG PO TABS
25.0000 mg | ORAL_TABLET | Freq: Every day | ORAL | Status: DC
  Administered 2024-08-11 – 2024-08-14 (×4): 25 mg via ORAL
  Filled 2024-08-11 (×4): qty 1

## 2024-08-11 MED ORDER — LACTATED RINGERS IV SOLN
150.0000 mL/h | INTRAVENOUS | Status: AC
  Administered 2024-08-11 – 2024-08-12 (×3): 150 mL/h via INTRAVENOUS

## 2024-08-11 MED ORDER — LUBIPROSTONE 24 MCG PO CAPS
24.0000 ug | ORAL_CAPSULE | Freq: Two times a day (BID) | ORAL | Status: DC
  Administered 2024-08-11 – 2024-08-16 (×10): 24 ug via ORAL
  Filled 2024-08-11 (×10): qty 1

## 2024-08-11 MED ORDER — AMLODIPINE BESYLATE 5 MG PO TABS: 2.5000 mg | ORAL_TABLET | Freq: Every day | ORAL | Status: DC

## 2024-08-11 MED ORDER — TIMOLOL MALEATE 0.5 % OP SOLN: 1.0000 [drp] | Freq: Every day | OPHTHALMIC | Status: DC | PRN

## 2024-08-11 MED ORDER — METRONIDAZOLE 500 MG/100ML IV SOLN
500.0000 mg | Freq: Once | INTRAVENOUS | Status: AC
  Administered 2024-08-11: 500 mg via INTRAVENOUS
  Filled 2024-08-11: qty 100

## 2024-08-11 MED ORDER — ONDANSETRON HCL 4 MG/2ML IJ SOLN
4.0000 mg | Freq: Once | INTRAMUSCULAR | Status: AC
  Administered 2024-08-11: 4 mg via INTRAVENOUS
  Filled 2024-08-11: qty 2

## 2024-08-11 MED ORDER — SUCRALFATE 1 G PO TABS: 1.0000 g | ORAL_TABLET | Freq: Three times a day (TID) | ORAL | Status: DC | PRN

## 2024-08-11 MED ORDER — DICYCLOMINE HCL 10 MG PO CAPS
20.0000 mg | ORAL_CAPSULE | Freq: Two times a day (BID) | ORAL | Status: DC | PRN
  Administered 2024-08-12 – 2024-08-14 (×3): 20 mg via ORAL
  Filled 2024-08-11 (×3): qty 2

## 2024-08-11 MED ORDER — VANCOMYCIN HCL IN DEXTROSE 1-5 GM/200ML-% IV SOLN
1000.0000 mg | Freq: Once | INTRAVENOUS | Status: AC
  Administered 2024-08-11: 1000 mg via INTRAVENOUS
  Filled 2024-08-11: qty 200

## 2024-08-11 MED ORDER — AMLODIPINE BESYLATE 5 MG PO TABS
2.5000 mg | ORAL_TABLET | Freq: Every day | ORAL | Status: DC
  Administered 2024-08-14 – 2024-08-16 (×3): 2.5 mg via ORAL
  Filled 2024-08-11 (×5): qty 1

## 2024-08-11 MED ORDER — ONDANSETRON HCL 4 MG PO TABS
4.0000 mg | ORAL_TABLET | Freq: Four times a day (QID) | ORAL | Status: DC | PRN
  Filled 2024-08-11: qty 1

## 2024-08-11 MED ORDER — METRONIDAZOLE 500 MG/100ML IV SOLN
500.0000 mg | Freq: Two times a day (BID) | INTRAVENOUS | Status: DC
  Administered 2024-08-11 – 2024-08-13 (×4): 500 mg via INTRAVENOUS
  Filled 2024-08-11 (×5): qty 100

## 2024-08-11 MED ORDER — ACETAMINOPHEN 650 MG RE SUPP: 650.0000 mg | Freq: Four times a day (QID) | RECTAL | Status: DC | PRN

## 2024-08-11 MED ORDER — SODIUM CHLORIDE 0.9 % IV SOLN
2.0000 g | Freq: Three times a day (TID) | INTRAVENOUS | Status: DC
  Administered 2024-08-11 – 2024-08-15 (×13): 2 g via INTRAVENOUS
  Filled 2024-08-11 (×13): qty 12.5

## 2024-08-11 MED ORDER — MORPHINE SULFATE (PF) 4 MG/ML IV SOLN
4.0000 mg | INTRAVENOUS | Status: AC | PRN
  Administered 2024-08-12 – 2024-08-13 (×4): 4 mg via INTRAVENOUS
  Filled 2024-08-11 (×4): qty 1

## 2024-08-11 MED ORDER — AMITRIPTYLINE HCL 50 MG PO TABS
50.0000 mg | ORAL_TABLET | Freq: Every day | ORAL | Status: DC
  Administered 2024-08-11 – 2024-08-15 (×5): 50 mg via ORAL
  Filled 2024-08-11 (×2): qty 1
  Filled 2024-08-11 (×2): qty 2
  Filled 2024-08-11 (×2): qty 1
  Filled 2024-08-11: qty 2
  Filled 2024-08-11: qty 1
  Filled 2024-08-11: qty 2

## 2024-08-11 MED ORDER — ALBUTEROL SULFATE (2.5 MG/3ML) 0.083% IN NEBU: 3.0000 mL | INHALATION_SOLUTION | Freq: Four times a day (QID) | RESPIRATORY_TRACT | Status: DC | PRN

## 2024-08-11 MED ORDER — LACTATED RINGERS IV BOLUS
1000.0000 mL | Freq: Once | INTRAVENOUS | Status: AC
  Administered 2024-08-11: 1000 mL via INTRAVENOUS

## 2024-08-11 MED ORDER — VITAMIN D 25 MCG (1000 UNIT) PO TABS
1000.0000 [IU] | ORAL_TABLET | Freq: Every day | ORAL | Status: DC
  Administered 2024-08-11 – 2024-08-15 (×5): 1000 [IU] via ORAL
  Filled 2024-08-11 (×5): qty 1

## 2024-08-11 MED ORDER — FLUTICASONE PROPIONATE 50 MCG/ACT NA SUSP
2.0000 | Freq: Every day | NASAL | Status: DC
  Administered 2024-08-12 – 2024-08-16 (×4): 2 via NASAL
  Filled 2024-08-11: qty 16

## 2024-08-11 MED ORDER — SUCRALFATE 1 G PO TABS
1.0000 g | ORAL_TABLET | Freq: Three times a day (TID) | ORAL | Status: DC
  Administered 2024-08-11 – 2024-08-16 (×19): 1 g via ORAL
  Filled 2024-08-11 (×19): qty 1

## 2024-08-11 MED ORDER — ONDANSETRON HCL 4 MG/2ML IJ SOLN
4.0000 mg | Freq: Four times a day (QID) | INTRAMUSCULAR | Status: DC | PRN
  Administered 2024-08-13 – 2024-08-16 (×3): 4 mg via INTRAVENOUS
  Filled 2024-08-11 (×3): qty 2

## 2024-08-11 MED ORDER — ATORVASTATIN CALCIUM 10 MG PO TABS
20.0000 mg | ORAL_TABLET | Freq: Every day | ORAL | Status: DC
  Administered 2024-08-11 – 2024-08-15 (×5): 20 mg via ORAL
  Filled 2024-08-11 (×5): qty 2

## 2024-08-11 MED ORDER — PANTOPRAZOLE SODIUM 40 MG PO TBEC
40.0000 mg | DELAYED_RELEASE_TABLET | Freq: Every day | ORAL | Status: DC
  Administered 2024-08-12 – 2024-08-13 (×2): 40 mg via ORAL
  Filled 2024-08-11 (×2): qty 1

## 2024-08-11 MED ORDER — LIDOCAINE-EPINEPHRINE (PF) 2 %-1:200000 IJ SOLN
10.0000 mL | Freq: Once | INTRAMUSCULAR | Status: AC
  Administered 2024-08-11: 10 mL via INTRADERMAL
  Filled 2024-08-11: qty 20

## 2024-08-11 MED ORDER — MONTELUKAST SODIUM 10 MG PO TABS
10.0000 mg | ORAL_TABLET | Freq: Every day | ORAL | Status: DC
  Administered 2024-08-11 – 2024-08-15 (×5): 10 mg via ORAL
  Filled 2024-08-11 (×5): qty 1

## 2024-08-11 MED ORDER — MORPHINE SULFATE (PF) 4 MG/ML IV SOLN
4.0000 mg | Freq: Once | INTRAVENOUS | Status: AC
  Administered 2024-08-11: 4 mg via INTRAVENOUS
  Filled 2024-08-11: qty 1

## 2024-08-11 NOTE — Assessment & Plan Note (Signed)
-   PPI equivalent resumed

## 2024-08-11 NOTE — ED Triage Notes (Addendum)
 Patient states right knee pain and swelling for 2 days. Denies injury but has been doing new knee exercises. Hx of knee replacement in same knee.

## 2024-08-11 NOTE — Plan of Care (Signed)

## 2024-08-11 NOTE — Consult Note (Signed)
 Reason for Consult: Right knee pain  Cassidy Olsen is an 64 y.o. female.  HPI: Patient presents to the emergency department earlier today with right knee pain, and a temperature.  Per the emergency room physician, an aspiration of the right knee joint was attempted twice, but no fluid was aspirated.  Per the emergency room physician, there was redness about the knee.  The patient has since been admitted to a medical team, and has had antibiotics.  Per the patient, the redness has improved dramatically.  The patient did show me a picture of her knee from last night, and clearly, she does currently appear improved compared to her picture.  She is able to slightly extend and flex the knee.  She does state that she does have chronic stiffness of the right knee.  She is status post a right total knee arthroplasty from 2015.  [place] state.  Of note, the patient states that her knee pain began shortly after doing some exercises involving her right knee.  Past Medical History:  Diagnosis Date   ADHD    Arthritis 2010   Asthma    Back pain    Chronic headaches    Chronic pain    Constipation    Edema of both lower extremities    Family history of breast cancer    Family history of prostate cancer    Gallstones 2018   GERD (gastroesophageal reflux disease)    High cholesterol    History of kidney stones    Hx: UTI (urinary tract infection)    Hypertension    IBS (irritable bowel syndrome)    Interstitial cystitis 2012   Joint pain    Kidney problem    Migraine    Occipital neuralgia    Osteoarthritis    PNA (pneumonia) 2018   Posttraumatic muscle contracture    Pre-diabetes    Spinal stenosis    Trigeminal neuralgia     Past Surgical History:  Procedure Laterality Date   ABDOMINAL HYSTERECTOMY     ANKLE SURGERY Left 05/2009   APPENDECTOMY     Benign Tumor Removal Right 01/26/2017   right upper arm by Dr. Dale Melia at [facility] [place]    BRAIN  SURGERY     CARPAL TUNNEL RELEASE Right 2014   CHOLECYSTECTOMY N/A 12/24/2020   Procedure: LAPAROSCOPIC CHOLECYSTECTOMY;  Surgeon: Vernetta Berg, MD;  Location: [facility] OR;  Service: General;  Laterality: N/A;   COLONOSCOPY     CYSTOSCOPY W/ URETERAL STENT PLACEMENT Left 07/25/2019   Procedure: CYSTOSCOPY WITH RETROGRADE PYELOGRAM/URETERAL STENT PLACEMENT;  Surgeon: Matilda Senior, MD;  Location: [facility] ORS;  Service: Urology;  Laterality: Left;   CYSTOSCOPY W/ URETERAL STENT REMOVAL Left 08/15/2019   Procedure: CYSTOSCOPY WITH STENT REMOVAL;  Surgeon: Matilda Senior, MD;  Location: [facility];  Service: Urology;  Laterality: Left;   CYSTOSCOPY WITH RETROGRADE PYELOGRAM, URETEROSCOPY AND STENT PLACEMENT Left 08/15/2019   Procedure: CYSTOSCOPY WITH RETROGRADE PYELOGRAM, URETEROSCOPY;  Surgeon: Matilda Senior, MD;  Location: [facility];  Service: Urology;  Laterality: Left;  90 MINS   CYSTOSCOPY WITH RETROGRADE PYELOGRAM, URETEROSCOPY AND STENT PLACEMENT Right 09/12/2019   Procedure: CYSTOSCOPY WITH RIGHT RETROGRADE PYELOGRAM  URETEROSCOPY WITH HOLMIUM LASER STONE EXTRACTION AND STENT PLACEMENT;  Surgeon: Matilda Senior, MD;  Location: [facility];  Service: Urology;  Laterality: Right;   DILATION AND CURETTAGE OF UTERUS     HOLMIUM LASER APPLICATION Left 08/15/2019   Procedure: HOLMIUM LASER APPLICATION;  Surgeon: Matilda Senior, MD;  Location: [facility];  Service: Urology;  Laterality: Left;   JOINT REPLACEMENT     R knee   KIDNEY STONE SURGERY     neuro spine similator  01/2021   OVARIAN CYST REMOVAL  05/2010   right knee revision  2016   ROTATOR CUFF REPAIR Right 02/2019   SPINE SURGERY     x 3   TUBAL LIGATION      Family History  Problem Relation Age of Onset   Ulcers Mother 24       peptic ulcer   Irritable bowel syndrome Mother    High blood pressure Mother    Sudden death Mother    Depression Mother    Prostate cancer Father 9   Heart disease Father    Irritable bowel  syndrome Father    High blood pressure Father    Breast cancer Sister 40       ER+/PR+/Her2- breast cancer   Alzheimer's disease Sister    Hypertension Sister    Kidney disease Sister    Celiac disease Sister    Thyroid  disease Sister    Breast cancer Paternal Grandmother    Diabetes Paternal Grandmother    Cancer Paternal Uncle        unknown cancers   Liver disease Neg Hx    Esophageal cancer Neg Hx    Colon cancer Neg Hx     Social History:  reports that she has never smoked. She has never used smokeless tobacco. She reports current alcohol use. She reports that she does not use drugs.  Allergies: Allergies[1]  Medications: I have reviewed the patient's current medications.  Results for orders placed or performed during the hospital encounter of 08/11/24 (from the past 48 hours)  CBC with Differential     Status: Abnormal   Collection Time: 08/11/24  8:55 AM  Result Value Ref Range   WBC 13.3 (H) 4.0 - 10.5 K/uL   RBC 4.05 3.87 - 5.11 MIL/uL   Hemoglobin 13.2 12.0 - 15.0 g/dL   HCT 61.5 63.9 - 53.9 %   MCV 94.8 80.0 - 100.0 fL   MCH 32.6 26.0 - 34.0 pg   MCHC 34.4 30.0 - 36.0 g/dL   RDW 86.5 88.4 - 84.4 %   Platelets 349 150 - 400 K/uL   nRBC 0.0 0.0 - 0.2 %   Neutrophils Relative % 72 %   Neutro Abs 9.7 (H) 1.7 - 7.7 K/uL   Lymphocytes Relative 17 %   Lymphs Abs 2.3 0.7 - 4.0 K/uL   Monocytes Relative 8 %   Monocytes Absolute 1.1 (H) 0.1 - 1.0 K/uL   Eosinophils Relative 1 %   Eosinophils Absolute 0.1 0.0 - 0.5 K/uL   Basophils Relative 1 %   Basophils Absolute 0.1 0.0 - 0.1 K/uL   Immature Granulocytes 1 %   Abs Immature Granulocytes 0.06 0.00 - 0.07 K/uL    Comment: Performed at [facility], [address], [place], [place] [address]  Comprehensive metabolic panel     Status: Abnormal   Collection Time: 08/11/24  8:55 AM  Result Value Ref Range   Sodium 138 135 - 145 mmol/L   Potassium 3.7 3.5 - 5.1 mmol/L   Chloride 100 98 - 111  mmol/L   CO2 27 22 - 32 mmol/L   Glucose, Bld 94 70 - 99 mg/dL    Comment: Glucose reference range applies only to samples taken after fasting for at least 8 hours.   BUN  9 8 - 23 mg/dL   Creatinine, Ser 9.16 0.44 - 1.00 mg/dL   Calcium  9.9 8.9 - 10.3 mg/dL   Total Protein 7.3 6.5 - 8.1 g/dL   Albumin 4.1 3.5 - 5.0 g/dL   AST 60 (H) 15 - 41 U/L   ALT 45 (H) 0 - 44 U/L   Alkaline Phosphatase 88 38 - 126 U/L   Total Bilirubin 0.7 0.0 - 1.2 mg/dL   GFR, Estimated >39 >39 mL/min    Comment: (NOTE) Calculated using the CKD-EPI Creatinine Equation (2021)    Anion gap 12 5 - 15    Comment: Performed at [facility], [address] [place], [place] [address]  Sedimentation rate     Status: Abnormal   Collection Time: 08/11/24  8:55 AM  Result Value Ref Range   Sed Rate 49 (H) 0 - 22 mm/hr    Comment: Performed at [facility], [address], [place], [place] [address]  Resp panel by RT-PCR (RSV, Flu A&B, Covid) Peripheral     Status: None   Collection Time: 08/11/24  8:55 AM   Specimen: Peripheral; Nasal Swab  Result Value Ref Range   SARS Coronavirus 2 by RT PCR NEGATIVE NEGATIVE    Comment: (NOTE) SARS-CoV-2 target nucleic acids are NOT DETECTED.  The SARS-CoV-2 RNA is generally detectable in upper respiratory specimens during the acute phase of infection. The lowest concentration of SARS-CoV-2 viral copies this assay can detect is 138 copies/mL. A negative result does not preclude SARS-Cov-2 infection and should not be used as the sole basis for treatment or other patient management decisions. A negative result may occur with  improper specimen collection/handling, submission of specimen other than nasopharyngeal swab, presence of viral mutation(s) within the areas targeted by this assay, and inadequate number of viral copies(<138 copies/mL). A negative result must be combined with clinical observations, patient history, and  epidemiological information. The expected result is Negative.  Fact Sheet for Patients:  bloggercourse.com  Fact Sheet for Healthcare Providers:  seriousbroker.it  This test is no t yet approved or cleared by the [place]  FDA and  has been authorized for detection and/or diagnosis of SARS-CoV-2 by FDA under an Emergency Use Authorization (EUA). This EUA will remain  in effect (meaning this test can be used) for the duration of the COVID-19 declaration under Section 564(b)(1) of the Act, 21 U.S.C.section 360bbb-3(b)(1), unless the authorization is terminated  or revoked sooner.       Influenza A by PCR NEGATIVE NEGATIVE   Influenza B by PCR NEGATIVE NEGATIVE    Comment: (NOTE) The Xpert Xpress SARS-CoV-2/FLU/RSV plus assay is intended as an aid in the diagnosis of influenza from Nasopharyngeal swab specimens and should not be used as a sole basis for treatment. Nasal washings and aspirates are unacceptable for Xpert Xpress SARS-CoV-2/FLU/RSV testing.  Fact Sheet for Patients: bloggercourse.com  Fact Sheet for Healthcare Providers: seriousbroker.it  This test is not yet approved or cleared by the [place]  FDA and has been authorized for detection and/or diagnosis of SARS-CoV-2 by FDA under an Emergency Use Authorization (EUA). This EUA will remain in effect (meaning this test can be used) for the duration of the COVID-19 declaration under Section 564(b)(1) of the Act, 21 U.S.C. section 360bbb-3(b)(1), unless the authorization is terminated or revoked.     Resp Syncytial Virus by PCR NEGATIVE NEGATIVE    Comment: (NOTE) Fact Sheet for Patients: bloggercourse.com  Fact Sheet for Healthcare Providers: seriousbroker.it  This test  is not yet approved or cleared by the [place]  FDA and has been authorized for  detection and/or diagnosis of SARS-CoV-2 by FDA under an Emergency Use Authorization (EUA). This EUA will remain in effect (meaning this test can be used) for the duration of the COVID-19 declaration under Section 564(b)(1) of the Act, 21 U.S.C. section 360bbb-3(b)(1), unless the authorization is terminated or revoked.  Performed at [facility], [address] [place], [place] [address]   Lactic acid, plasma     Status: None   Collection Time: 08/11/24 11:21 AM  Result Value Ref Range   Lactic Acid, Venous 0.9 0.5 - 1.9 mmol/L    Comment: Performed at [facility], [address], [place], [place] [address]  Urinalysis, Routine w reflex microscopic -Urine, Clean Catch     Status: None   Collection Time: 08/11/24 11:57 AM  Result Value Ref Range   Color, Urine YELLOW YELLOW   APPearance CLEAR CLEAR   Specific Gravity, Urine 1.010 1.005 - 1.030   pH 8.0 5.0 - 8.0   Glucose, UA NEGATIVE NEGATIVE mg/dL   Hgb urine dipstick NEGATIVE NEGATIVE   Bilirubin Urine NEGATIVE NEGATIVE   Ketones, ur NEGATIVE NEGATIVE mg/dL   Protein, ur NEGATIVE NEGATIVE mg/dL   Nitrite NEGATIVE NEGATIVE   Leukocytes,Ua NEGATIVE NEGATIVE    Comment: Microscopic not done on urines with negative protein, blood, leukocytes, nitrite, or glucose < 500 mg/dL. Performed at [facility], [address], [place], [place] [address]     DG Chest Portable 1 View Result Date: 08/11/2024 CLINICAL DATA:  Fever EXAM: PORTABLE CHEST 1 VIEW COMPARISON:  June 07, 2024 FINDINGS: The heart size and mediastinal contours are within normal limits. Both lungs are clear. The visualized skeletal structures are unremarkable. IMPRESSION: No active disease. Electronically Signed   By: Lynwood Landy Raddle M.D.   On: 08/11/2024 10:54   DG Knee Complete 4 Views Right Result Date: 08/11/2024 EXAM: 4 OR MORE VIEW(S) XRAY OF THE RIGHT KNEE 08/11/2024 08:43:40 AM COMPARISON: None  available. CLINICAL HISTORY: pain pain FINDINGS: BONES AND JOINTS: Status post right total knee arthroplasty. No acute fracture. No malalignment. There is a mild suprapatellar bursal effusion. SOFT TISSUES: The soft tissues are unremarkable. IMPRESSION: 1. Status post right total knee arthroplasty. 2. Mild suprapatellar bursal effusion. Electronically signed by: Evalene Coho MD 08/11/2024 08:48 AM EST RP Workstation: [id number]    Review of Systems Blood pressure 100/69, pulse 94, temperature 98.2 F (36.8 C), temperature source Oral, resp. rate 18, height 5' 3 (1.6 m), weight 83.1 kg, SpO2 100%. Physical Exam HENT:     Head: Normocephalic and atraumatic.     Mouth/Throat:     Mouth: Mucous membranes are dry.  Eyes:     Pupils: Pupils are equal, round, and reactive to light.  Pulmonary:     Effort: Pulmonary effort is normal.  Abdominal:     General: Abdomen is flat.     Palpations: Abdomen is soft.  Skin:    General: Skin is warm and dry.     Capillary Refill: Capillary refill takes less than 2 seconds.     Comments: The patient's right lower extremity appears quite benign.  There is no prominent swelling, erythema, or tenderness about the right knee.  Neurological:     Mental Status: She is alert.  Psychiatric:        Mood and Affect: Mood normal.     Assessment/Plan: Patient's examination at the present time is quite  benign.  She has no erythema or warmth about the right knee.  There is some pain with range of motion.  The patient does report that she did increase her activity about 4 days ago, doing exercises, and this is about when her knee pain increased.  In any event, I do feel that a septic right knee joint is highly unlikely, as there was no fluid aspirated after 2 attempts, and currently, there is no prominent effusion, erythema, or warmth.  I do recommend additional antibiotics, and physical therapy.  I do anticipate ongoing improvements.  If her knee pain does not  improve, and if a diagnosis continues to be unclear, feel free to contact me.  Prentice Sackrider L Cheyna Retana 08/11/2024, 5:26 PM         [1]  Allergies Allergen Reactions   Aspirin Other (See Comments), Nausea And Vomiting and Nausea Only    Stomach upset Avoids due to IBS Due to ibs Stomach upset Due to ibs Avoids due to IBS   Ketorolac  Hives   Ketorolac  Tromethamine  Shortness Of Breath, Itching and Other (See Comments)    IM administration ONLY  REDNESS IM administration ONLY  REDNESS  SWELLING OF THE THROAT   ORAL TORADOL  CAUSED EXTREME REDNESS, ITCHINESS, DIFFICULTY BREATHING, AND FACIAL SWELLING. PATIENT ALSO DIAGNOSED WITH BELL'S PALSY.   Penicillins Hives, Itching, Rash and Other (See Comments)    Did it involve swelling of the face/tongue/throat, SOB, or low BP? No Did it involve sudden or severe rash/hives, skin peeling, or any reaction on the inside of your mouth or nose? Yes Did you need to seek medical attention at a hospital or doctor's office? No When did it last happen?      2000 If all above answers are NO, may proceed with cephalosporin use.  Vaginal rash Vaginal rash Did it involve swelling of the face/tongue/throat, SOB, or low BP? No Did it involve sudden or severe rash/hives, skin peeling, or any reaction on the inside of your mouth or nose? Yes Did you need to seek medical attention at a hospital or doctor's office? No When did it last happen?      2000 If all above answers are NO, may proceed with cephalosporin use. Vaginal rash Vaginal rash   Codeine Other (See Comments)    Cough meds with codeine-have withdraw symptoms when done Cough meds with codeine-have withdraw symptoms when done Cough meds with codeine-have withdraw symptoms when done   Ibuprofen Other (See Comments) and Nausea And Vomiting    Stomach upset Avoids due to IBS Stomach upset Avoids due to IBS   Latex Rash and Other (See Comments)    Vaginal irritation Vaginal  irritation Exacerbates genital herpes Exacerbates genital herpes   Omeprazole Magnesium Other (See Comments)    Lack of therapeutic effect Lack of therapeutic effect   Pantoprazole  Sodium Other (See Comments)    Lack of therapeutic effect Lack of therapeutic effect   Penicillamine Rash   Pregabalin Other (See Comments)    Weight gain Weight gain   Phenazopyridine      Other Reaction(s): angioedema   Pyridium  [Phenazopyridine  Hcl] Swelling    Lip swelling and burning

## 2024-08-11 NOTE — Assessment & Plan Note (Signed)
 Home amlodipine  2.5 mg daily, lisinopril  20 mg p.o. twice daily will resume for 1 4/5 Hydralazine  5 mg IV every 4 hours as needed for SBP greater 170, 5 days ordered

## 2024-08-11 NOTE — Assessment & Plan Note (Signed)
 With migraines Amitriptyline  50 mg nightly, timolol  as needed for migraine headaches, gabapentin  900 mg 3 times daily Tramadol  100 mg 3 times daily

## 2024-08-11 NOTE — H&P (Signed)
 " History and Physical - Telemedicine  Makinna Andy FMW:[id number] DOB: [birth year] DOA: 08/11/2024  PCP: Perri Starleen BROCKS, MD Patient coming from: home  Referring provider: Dr. Dreama, EDP Telemedicine provider: Dr. Sherre Patient location: [facility] ED at [facility] Referring diagnosis: SIRS/Sepsis Patient name and DOB verified: Patient was able to verify her first and last name: Cassidy Olsen, date of birth: 1960-12-25. Patient consented to Telemedicine Evaluation: yes RN virtual assistant: Toribio Jobs, RN Video encounter time and date: 08/11/2024 and at approximately: 12:12p  Chief Concern: right knee pain  HPI: Cassidy Olsen is a 64 year old female with history of migraine headache, trigeminal and occipital neuralgia, interstitial cystitis, hypertension, hyperlipidemia, asthma, IBS, ADHD, history of right knee replacement, depression.  08/11/2024: Patient presented to the ED for chief concerns of right knee pain.  Vitals at the time of my evaluation showed t of 103.1, rr 18, hr 106, blood pressure 108/91, SpO2 100% on room air.  Serum sodium is 138, potassium 3.7, chloride 100, bicarb 27, BUN of 9, serum creatinine 0.83, eGFR greater than 60, nonfasting blood glucose 94, WBC 13.3, hemoglobin of 13.2, platelets of 349.  Sed rate was elevated at 49.  CRP is pending.  COVID/influenza A/influenza B/RSV PCR were negative.  Blood cultures x 2, urine culture are in process.  UA is negative for leukocytes and nitrates.  ED treatment: Morphine  4 mg IV one-time dose, ondansetron  4 mg IV one-time dose, acetaminophen  1000 mg p.o. one-time dose, lidocaine  10 mg IV one-time dose, LR 1 L bolus, metronidazole  500 mg IV one-time dose, cefepime  and vancomycin  per pharmacy.  08/12/2023: I evaluated and assessed patient virtually for admission. ------------------------------------- At bedside, via telemedicine encounter, patient was able to tell me her first last name, her date of  birth.  Patient reports that her right knee started hurting for unknown reasons.  She denies trauma to the knee.  She denies known sick contacts.  Patient thought that she had a fever yesterday however did not check a temperature.  She denies cough, dysphagia, dysuria, nausea, vomiting, hematuria, blood in her stool, loss of consciousness, syncope.  She endorses intermittent diarrhea and constipation consistent with her diagnosis of IBS.  She denies changes to her bowel movements.  Social history: She lives at home.  ROS: Constitutional: no weight change, + fever ENT/Mouth: no sore throat, no rhinorrhea Eyes: no eye pain, no vision changes Cardiovascular: no chest pain, no dyspnea,  no edema, no palpitations Respiratory: no cough, no sputum, no wheezing Gastrointestinal: no nausea, no vomiting, no diarrhea, no constipation Genitourinary: no urinary incontinence, no dysuria, no hematuria Musculoskeletal: + arthralgias (right knee), no myalgias Skin: no skin lesions, no pruritus, Neuro: + weakness, no loss of consciousness, no syncope Psych: no anxiety, no depression, no decrease appetite Heme/Lymph: no bruising, no bleeding  ED Course: Discussed with EDP, patient requiring hospitalization for chief concerns of SIRS/sepsis.  Assessment/Plan  Principal Problem:   SIRS (systemic inflammatory response syndrome) (HCC) Active Problems:   Trigeminal neuralgia of right side of face   Occipital neuralgia   Essential hypertension   Irritable bowel syndrome with constipation   Hyperlipidemia   Depression   Obesity (BMI 30-39.9)   Gastroesophageal reflux disease   Assessment and Plan:  * SIRS (systemic inflammatory response syndrome) (HCC) Patient met SIRS criteria with elevated fever of 103.1, increased heart rate, and leukocytosis of 13.3 blood cultures x 2, urine culture are in process. A source is unclear at this time however I do suspect it  is the right knee though there is no  recent trauma or IV injection or treatment Orthopedic provider recommends MRI of the knee. MRI right knee w wo CM ordered Lactic acid initial value is 0.9 Maintain MAP greater than 65 Will follow a second lactic acid Continue with broad-spectrum antibiotic, vancomycin , cefepime , metronidazole   Trigeminal neuralgia of right side of face With migraines Amitriptyline  50 mg nightly, timolol  as needed for migraine headaches, gabapentin  900 mg 3 times daily Tramadol  100 mg 3 times daily  Gastroesophageal reflux disease PPI equivalent resumed  Obesity (BMI 30-39.9) This complicates overall care and prognosis.   Depression Amitriptyline  50 mg nightly  Hyperlipidemia Home atorvastatin  20 mg nightly resumed  Irritable bowel syndrome with constipation No changes at baseline per patient  Essential hypertension Home amlodipine  2.5 mg daily, lisinopril  20 mg p.o. twice daily will resume for 1 4/5 Hydralazine  5 mg IV every 4 hours as needed for SBP greater 170, 5 days ordered  Chart reviewed.   DVT prophylaxis: Heparin  5000 units subcutaneous every 8 hours Code Status: Full code Diet: Heart healthy Family Communication: A phone call was offered, patient declined stating that patient already updated her daughter and niece.   Disposition Plan:  Pending clinical course Consults called: Pharmacy.  EDP discussed with orthopedic surgeon who states they will see the patient upon patient arrival to the medical floor Admission status: Inpatient, telemetry  Past Medical History:  Diagnosis Date   ADHD    Arthritis 2010   Asthma    Back pain    Chronic headaches    Chronic pain    Constipation    Edema of both lower extremities    Family history of breast cancer    Family history of prostate cancer    Gallstones 2018   GERD (gastroesophageal reflux disease)    High cholesterol    History of kidney stones    Hx: UTI (urinary tract infection)    Hypertension    IBS (irritable bowel  syndrome)    Interstitial cystitis 2012   Joint pain    Kidney problem    Migraine    Occipital neuralgia    Osteoarthritis    PNA (pneumonia) 2018   Posttraumatic muscle contracture    Pre-diabetes    Spinal stenosis    Trigeminal neuralgia    Past Surgical History:  Procedure Laterality Date   ABDOMINAL HYSTERECTOMY     ANKLE SURGERY Left 05/2009   APPENDECTOMY     Benign Tumor Removal Right 01/26/2017   right upper arm by Dr. Dale Melia at [facility] [place]    BRAIN SURGERY     CARPAL TUNNEL RELEASE Right 2014   CHOLECYSTECTOMY N/A 12/24/2020   Procedure: LAPAROSCOPIC CHOLECYSTECTOMY;  Surgeon: Vernetta Berg, MD;  Location: [facility] OR;  Service: General;  Laterality: N/A;   COLONOSCOPY     CYSTOSCOPY W/ URETERAL STENT PLACEMENT Left 07/25/2019   Procedure: CYSTOSCOPY WITH RETROGRADE PYELOGRAM/URETERAL STENT PLACEMENT;  Surgeon: Matilda Senior, MD;  Location: [facility] ORS;  Service: Urology;  Laterality: Left;   CYSTOSCOPY W/ URETERAL STENT REMOVAL Left 08/15/2019   Procedure: CYSTOSCOPY WITH STENT REMOVAL;  Surgeon: Matilda Senior, MD;  Location: [facility] OR[facility];  Service: Urology;  Laterality: Left;   CYSTOSCOPY WITH RETROGRADE PYELOGRAM, URETEROSCOPY AND STENT PLACEMENT Left 08/15/2019   Procedure: CYSTOSCOPY WITH RETROGRADE PYELOGRAM, URETEROSCOPY;  Surgeon: Matilda Senior, MD;  Location: [facility];  Service: Urology;  Laterality: Left;  90 MINS   CYSTOSCOPY WITH RETROGRADE PYELOGRAM, URETEROSCOPY AND STENT PLACEMENT Right 09/12/2019  Procedure: CYSTOSCOPY WITH RIGHT RETROGRADE PYELOGRAM  URETEROSCOPY WITH HOLMIUM LASER STONE EXTRACTION AND STENT PLACEMENT;  Surgeon: Matilda Senior, MD;  Location: [facility];  Service: Urology;  Laterality: Right;   DILATION AND CURETTAGE OF UTERUS     HOLMIUM LASER APPLICATION Left 08/15/2019   Procedure: HOLMIUM LASER APPLICATION;  Surgeon: Matilda Senior, MD;  Location: [facility];  Service: Urology;  Laterality: Left;   JOINT  REPLACEMENT     R knee   KIDNEY STONE SURGERY     neuro spine similator  01/2021   OVARIAN CYST REMOVAL  05/2010   right knee revision  2016   ROTATOR CUFF REPAIR Right 02/2019   SPINE SURGERY     x 3   TUBAL LIGATION     Social History:  reports that she has never smoked. She has never used smokeless tobacco. She reports current alcohol use. She reports that she does not use drugs.  Allergies[1] Family History  Problem Relation Age of Onset   Ulcers Mother 46       peptic ulcer   Irritable bowel syndrome Mother    High blood pressure Mother    Sudden death Mother    Depression Mother    Prostate cancer Father 41   Heart disease Father    Irritable bowel syndrome Father    High blood pressure Father    Breast cancer Sister 42       ER+/PR+/Her2- breast cancer   Alzheimer's disease Sister    Hypertension Sister    Kidney disease Sister    Celiac disease Sister    Thyroid  disease Sister    Breast cancer Paternal Grandmother    Diabetes Paternal Grandmother    Cancer Paternal Uncle        unknown cancers   Liver disease Neg Hx    Esophageal cancer Neg Hx    Colon cancer Neg Hx    Family history: Family history reviewed and not pertinent.  Prior to Admission medications  Medication Sig Start Date End Date Taking? Authorizing Provider  albuterol  (VENTOLIN  HFA) 108 (90 Base) MCG/ACT inhaler Inhale 2 puffs into the lungs every 6 (six) hours as needed for wheezing or shortness of breath. 06/02/22  Yes Icard, Bradley L, DO  amitriptyline  (ELAVIL ) 50 MG tablet TAKE 1 TABLET BY MOUTH EVERYDAY AT BEDTIME 02/23/24  Yes Ines Onetha NOVAK, MD  amLODipine  (NORVASC ) 2.5 MG tablet Take 1 tablet by mouth daily. 12/11/23  Yes [provider]  atorvastatin  (LIPITOR) 20 MG tablet Take 1 tablet (20 mg total) by mouth daily. 07/30/24  Yes Court Dorn PARAS, MD  botulinum toxin Type A  (BOTOX ) 200 units injection INJECT 155 UNITS INTRAMUSCULARLY EVERY 12 WEEKS (GIVEN AT MD  OFFICE,  DISCARD UNUSED) 06/18/24  Yes Lomax, Ghazi Rumpf, NP  cetirizine (ZYRTEC) 10 MG tablet Take 10 mg by mouth daily.   Yes [provider]  dexlansoprazole  (DEXILANT ) 60 MG capsule Take 1 capsule (60 mg total) by mouth daily. 02/21/24  Yes Nandigam, Kavitha V, MD  dicyclomine  (BENTYL ) 10 MG capsule Take 2 capsules (20 mg total) by mouth in the morning and at bedtime. PRN 02/21/24  Yes Nandigam, Kavitha V, MD  ELDERBERRY PO ELDERBERRY IMMUNE SUPPORT   Yes [provider]  fluticasone  (FLONASE ) 50 MCG/ACT nasal spray Place 2 sprays into both nostrils daily. 06/08/20  Yes Icard, Bradley L, DO  fluticasone -salmeterol (WIXELA INHUB) 250-50 MCG/ACT AEPB Inhale 1 puff into the lungs in the morning and at bedtime. 07/30/24  Yes Hope,  Almarie ORN, NP  gabapentin  (NEURONTIN ) 300 MG capsule Take 3 capsules (900 mg total) by mouth 3 (three) times daily. 05/24/22  Yes Lomax, Walaa Carel, NP  lidocaine  4 % Place 1 patch onto the skin as needed.   Yes [provider]  lisinopril  (ZESTRIL ) 20 MG tablet Take 20 mg by mouth 2 (two) times daily. 06/03/24  Yes [provider]  lubiprostone  (AMITIZA ) 24 MCG capsule TAKE 1 CAPSULE (24 MCG TOTAL) BY MOUTH 2 (TWO) TIMES DAILY WITH A MEAL. 05/22/24  Yes Nandigam, Kavitha V, MD  montelukast  (SINGULAIR ) 10 MG tablet TAKE 1 TABLET BY MOUTH EVERYDAY AT BEDTIME 07/29/24  Yes Hunsucker, Donnice SAUNDERS, MD  MOVANTIK  25 MG TABS tablet TAKE 1 TABLET (25 MG TOTAL) BY MOUTH DAILY. 05/28/24  Yes Nandigam, Kavitha V, MD  Omega-3 Fatty Acids (OMEGA III EPA+DHA) 1000 MG CAPS Take by mouth. 09/22/22  Yes [provider]  Oregano Oil-Flaxseed Oil (OREGANO OIL IMMUNE SUPPORT) 50-25 MG CAPS    Yes [provider]  Spacer/Aero-Holding Chambers (AEROCHAMBER MV) inhaler Use as instructed 04/18/18  Yes Icard, Bradley L, DO  sucralfate  (CARAFATE ) 1 g tablet Take 1 tablet (1 g total) by mouth 4 (four) times daily -  with meals and at bedtime. 08/06/24  Yes Zehr, Jessica D,  PA-C  timolol  (TIMOPTIC ) 0.5 % ophthalmic solution 1-2 drops eyes at onset and 30 minutes after if needed. Can repeat in 2 hours for headache/migraine. 11/06/23  Yes Ines Onetha NOVAK, MD  traMADol  (ULTRAM ) 50 MG tablet Take 100 mg by mouth 3 (three) times daily. 2x daily   Yes [provider]  valACYclovir  (VALTREX ) 500 MG tablet Take 500 mg by mouth 2 (two) times daily as needed (outbreaks).    Yes [provider]  Vitamin D , Cholecalciferol , 25 MCG (1000 UT) CAPS Take 1 capsule by mouth daily.   Yes [provider]  ACETAMINOPHEN  PO Take 1,300 mg by mouth 2 (two) times daily.    [provider]  celecoxib  (CELEBREX ) 200 MG capsule Take 200 mg by mouth 2 (two) times daily.    [provider]  Fremanezumab -vfrm (AJOVY ) 225 MG/1.5ML SOAJ Inject 225 mg into the skin every 30 (thirty) days. 10/25/23   Lomax, Fuad Forget, NP  glycopyrrolate  (ROBINUL ) 2 MG tablet Take 1 tablet (2 mg total) by mouth 2 (two) times daily. 04/19/23   Kerman Vina HERO, NP   Physical Exam completed with assistance of: Toribio Jobs, RN, who was at bedside during this portion of the virtual encounter:  Vitals:   08/11/24 1045 08/11/24 1100 08/11/24 1115 08/11/24 1231  BP: 108/72 113/74 94/67   Pulse:  (!) 110 (!) 108   Resp:  18    Temp:    98.5 F (36.9 C)  TempSrc:    Oral  SpO2:  98% 99%   Weight:      Height:       Constitutional: appears age-appropriate, NAD, calm Eyes: EOMI,  conjunctivae normal ENMT: Mucous membranes are moist. Hearing appropriate Neck: normal, supple, no masses, no thyromegaly Respiratory: clear to auscultation bilaterally, no wheezing. Normal respiratory effort. No accessory muscle use.  Cardiovascular: Regular rate and rhythm, no murmurs. No extremity edema. 2+ pedal pulses. Abdomen: no tenderness. Bowel sounds positive.  Musculoskeletal: Decreased range of motion of the right knee.  Right knee swelling and increased warmth is present.  No contractures  or atrophy.  Good range of motion in all other extremities. Skin: no rashes, ulcers on visible skin Neurologic: Strength  is appropriate upper extremities.  Psychiatric: Normal judgment and insight. Alert and oriented x 3. Normal mood.   EKG: Not indicated at this time  X-rays on Admission: I personally reviewed and I agree with radiologist reading as below.  DG Chest Portable 1 View Result Date: 08/11/2024 CLINICAL DATA:  Fever EXAM: PORTABLE CHEST 1 VIEW COMPARISON:  June 07, 2024 FINDINGS: The heart size and mediastinal contours are within normal limits. Both lungs are clear. The visualized skeletal structures are unremarkable. IMPRESSION: No active disease. Electronically Signed   By: Lynwood Landy Raddle M.D.   On: 08/11/2024 10:54   DG Knee Complete 4 Views Right Result Date: 08/11/2024 EXAM: 4 OR MORE VIEW(S) XRAY OF THE RIGHT KNEE 08/11/2024 08:43:40 AM COMPARISON: None available. CLINICAL HISTORY: pain pain FINDINGS: BONES AND JOINTS: Status post right total knee arthroplasty. No acute fracture. No malalignment. There is a mild suprapatellar bursal effusion. SOFT TISSUES: The soft tissues are unremarkable. IMPRESSION: 1. Status post right total knee arthroplasty. 2. Mild suprapatellar bursal effusion. Electronically signed by: Evalene Coho MD 08/11/2024 08:48 AM EST RP Workstation: [id number]   Labs on Admission: I have personally reviewed following labs  CBC: Recent Labs  Lab 08/11/24 0855  WBC 13.3*  NEUTROABS 9.7*  HGB 13.2  HCT 38.4  MCV 94.8  PLT 349   Basic Metabolic Panel: Recent Labs  Lab 08/11/24 0855  NA 138  K 3.7  CL 100  CO2 27  GLUCOSE 94  BUN 9  CREATININE 0.83  CALCIUM  9.9   GFR: Estimated Creatinine Clearance: 71.8 mL/min (by C-G formula based on SCr of 0.83 mg/dL).  Liver Function Tests: Recent Labs  Lab 08/11/24 0855  AST 60*  ALT 45*  ALKPHOS 88  BILITOT 0.7  PROT 7.3  ALBUMIN 4.1   BNP (last 3 results) Recent Labs    07/30/24 1416   PROBNP 6.0   Urine analysis:    Component Value Date/Time   COLORURINE YELLOW 08/11/2024 1157   APPEARANCEUR CLEAR 08/11/2024 1157   LABSPEC 1.010 08/11/2024 1157   PHURINE 8.0 08/11/2024 1157   GLUCOSEU NEGATIVE 08/11/2024 1157   HGBUR NEGATIVE 08/11/2024 1157   BILIRUBINUR NEGATIVE 08/11/2024 1157   BILIRUBINUR negative 07/22/2019 1416   KETONESUR NEGATIVE 08/11/2024 1157   PROTEINUR NEGATIVE 08/11/2024 1157   UROBILINOGEN 0.2 05/04/2022 2037   NITRITE NEGATIVE 08/11/2024 1157   LEUKOCYTESUR NEGATIVE 08/11/2024 1157   This document was prepared using Dragon Voice Recognition software and may include unintentional dictation errors.  Dr. Sherre [facility] Location: [place]  If 7PM-7AM, please contact overnight-coverage provider If 7AM-7PM, please contact day attending provider [email or web address]  08/11/2024, 1:40 PM     [1]  Allergies Allergen Reactions   Aspirin Other (See Comments), Nausea And Vomiting and Nausea Only    Stomach upset Avoids due to IBS Due to ibs Stomach upset Due to ibs Avoids due to IBS   Ketorolac  Hives   Ketorolac  Tromethamine  Shortness Of Breath, Itching and Other (See Comments)    IM administration ONLY  REDNESS IM administration ONLY  REDNESS  SWELLING OF THE THROAT   ORAL TORADOL  CAUSED EXTREME REDNESS, ITCHINESS, DIFFICULTY BREATHING, AND FACIAL SWELLING. PATIENT ALSO DIAGNOSED WITH BELL'S PALSY.   Penicillins Hives, Itching, Rash and Other (See Comments)    Did it involve swelling of the face/tongue/throat, SOB, or low BP? No Did it involve sudden or severe rash/hives, skin peeling, or any reaction on the inside of your mouth or nose? Yes Did you need to seek  medical attention at a hospital or doctor's office? No When did it last happen?      2000 If all above answers are NO, may proceed with cephalosporin use.  Vaginal rash Vaginal rash Did it involve swelling of the face/tongue/throat, SOB, or low BP? No Did it  involve sudden or severe rash/hives, skin peeling, or any reaction on the inside of your mouth or nose? Yes Did you need to seek medical attention at a hospital or doctor's office? No When did it last happen?      2000 If all above answers are NO, may proceed with cephalosporin use. Vaginal rash Vaginal rash   Codeine Other (See Comments)    Cough meds with codeine-have withdraw symptoms when done Cough meds with codeine-have withdraw symptoms when done Cough meds with codeine-have withdraw symptoms when done   Ibuprofen Other (See Comments) and Nausea And Vomiting    Stomach upset Avoids due to IBS Stomach upset Avoids due to IBS   Latex Rash and Other (See Comments)    Vaginal irritation Vaginal irritation Exacerbates genital herpes Exacerbates genital herpes   Omeprazole Magnesium Other (See Comments)    Lack of therapeutic effect Lack of therapeutic effect   Pantoprazole  Sodium Other (See Comments)    Lack of therapeutic effect Lack of therapeutic effect   Penicillamine Rash   Pregabalin Other (See Comments)    Weight gain Weight gain   Phenazopyridine      Other Reaction(s): angioedema   Pyridium  [Phenazopyridine  Hcl] Swelling    Lip swelling and burning   "

## 2024-08-11 NOTE — Assessment & Plan Note (Signed)
Home atorvastatin 20 mg nightly resumed

## 2024-08-11 NOTE — Assessment & Plan Note (Signed)
 -  This complicates overall care and prognosis.

## 2024-08-11 NOTE — Hospital Course (Addendum)
 Ms. Cassidy Olsen is a 64 year old female with history of migraine headache, trigeminal and occipital neuralgia, interstitial cystitis, hypertension, hyperlipidemia, asthma, IBS, ADHD, history of right knee replacement, depression.  08/11/2024: Patient presented to the ED for chief concerns of right knee pain.  Vitals at the time of my evaluation showed t of 103.1, rr 18, hr 106, blood pressure 108/91, SpO2 100% on room air.  Serum sodium is 138, potassium 3.7, chloride 100, bicarb 27, BUN of 9, serum creatinine 0.83, eGFR greater than 60, nonfasting blood glucose 94, WBC 13.3, hemoglobin of 13.2, platelets of 349.  Sed rate was elevated at 49.  CRP is pending.  COVID/influenza A/influenza B/RSV PCR were negative.  Blood cultures x 2, urine culture are in process.  UA is negative for leukocytes and nitrates.  ED treatment: Morphine  4 mg IV one-time dose, ondansetron  4 mg IV one-time dose, acetaminophen  1000 mg p.o. one-time dose, lidocaine  10 mg IV one-time dose, LR 1 L bolus, metronidazole  500 mg IV one-time dose, cefepime  and vancomycin  per pharmacy.  08/12/2023: I evaluated and assessed patient virtually for admission.

## 2024-08-11 NOTE — ED Notes (Signed)
 Called to give report - was told RN would call back

## 2024-08-11 NOTE — Progress Notes (Addendum)
 Patient has an MRI ordered for her right knee. MRI team states her stimulator needs to be charged to 100% for her test but patient states her charger and remote for it are at home. She states her family will bring it tomorrow morning. MD Cox made aware and states it is okay for it to be done tomorrow.

## 2024-08-11 NOTE — Progress Notes (Signed)
 Pharmacy Antibiotic Note  Cassidy Olsen is a 64 y.o. female for which pharmacy has been consulted for cefepime  and vancomycin  dosing for sepsis.  SCr 0.83 WBC 13.3; LA 0.9; T 98.5; HR 106; RR 18 COVID neg / flu neg  Plan: Metronidazole  per MD Cefepime  2g q8hr  Vancomycin  1500 mg q24hr (eAUC 476.2) unless change in renal function Monitor WBC, fever, renal function, cultures De-escalate when able Levels at steady state  Height: 5' 3 (160 cm) Weight: 83.1 kg (183 lb 4 oz) IBW/kg (Calculated) : 52.4  Temp (24hrs), Avg:100.8 F (38.2 C), Min:98.5 F (36.9 C), Max:103.1 F (39.5 C)  Recent Labs  Lab 08/11/24 0855 08/11/24 1121  WBC 13.3*  --   CREATININE 0.83  --   LATICACIDVEN  --  0.9    Estimated Creatinine Clearance: 71.8 mL/min (by C-G formula based on SCr of 0.83 mg/dL).    Allergies[1]  Microbiology results: Pending  Thank you for allowing pharmacy to be a part of this patients care.  Dorn Buttner, PharmD, BCPS 08/11/2024 12:55 PM ED Clinical Pharmacist -  [phone]       [1]  Allergies Allergen Reactions   Aspirin Other (See Comments), Nausea And Vomiting and Nausea Only    Stomach upset Avoids due to IBS Due to ibs Stomach upset Due to ibs Avoids due to IBS   Ketorolac  Hives   Ketorolac  Tromethamine  Shortness Of Breath, Itching and Other (See Comments)    IM administration ONLY  REDNESS IM administration ONLY  REDNESS  SWELLING OF THE THROAT   ORAL TORADOL  CAUSED EXTREME REDNESS, ITCHINESS, DIFFICULTY BREATHING, AND FACIAL SWELLING. PATIENT ALSO DIAGNOSED WITH BELL'S PALSY.   Penicillins Hives, Itching, Rash and Other (See Comments)    Did it involve swelling of the face/tongue/throat, SOB, or low BP? No Did it involve sudden or severe rash/hives, skin peeling, or any reaction on the inside of your mouth or nose? Yes Did you need to seek medical attention at a hospital or doctor's office? No When did it last happen?      2000 If all  above answers are NO, may proceed with cephalosporin use.  Vaginal rash Vaginal rash Did it involve swelling of the face/tongue/throat, SOB, or low BP? No Did it involve sudden or severe rash/hives, skin peeling, or any reaction on the inside of your mouth or nose? Yes Did you need to seek medical attention at a hospital or doctor's office? No When did it last happen?      2000 If all above answers are NO, may proceed with cephalosporin use. Vaginal rash Vaginal rash   Codeine Other (See Comments)    Cough meds with codeine-have withdraw symptoms when done Cough meds with codeine-have withdraw symptoms when done Cough meds with codeine-have withdraw symptoms when done   Ibuprofen Other (See Comments) and Nausea And Vomiting    Stomach upset Avoids due to IBS Stomach upset Avoids due to IBS   Latex Rash and Other (See Comments)    Vaginal irritation Vaginal irritation Exacerbates genital herpes Exacerbates genital herpes   Omeprazole Magnesium Other (See Comments)    Lack of therapeutic effect Lack of therapeutic effect   Pantoprazole  Sodium Other (See Comments)    Lack of therapeutic effect Lack of therapeutic effect   Penicillamine Rash   Pregabalin Other (See Comments)    Weight gain Weight gain   Phenazopyridine      Other Reaction(s): angioedema   Pyridium  [Phenazopyridine  Hcl] Swelling    Lip swelling and burning

## 2024-08-11 NOTE — Progress Notes (Signed)
 Please see H&P dictated by Dr. Sherre. I have seen and assessed patient and I agree with Dr. Sherre assessment and plan. Patient is a pleasant 65 year old female history of migraine headaches, trigeminal and occipital neuralgia, interstitial cystitis, hypertension, hyperlipidemia, IBS, ADHD, history of right knee replacement presenting to the ED with a 2-day history of worsening right knee pain, warmth, erythema, subjective fevers, inability to ambulate on right lower extremity x 1 day.  Workup concerning for SIRS with source likely right knee.  EDP spoke with orthopedics, Dr. Beuford who recommended MRI of the right knee and patient will be seen in formal consultation.  Patient placed on empiric IV antibiotics, IV fluids, pain medications.  No charge.

## 2024-08-11 NOTE — Assessment & Plan Note (Signed)
 No changes at baseline per patient

## 2024-08-11 NOTE — Assessment & Plan Note (Signed)
 Patient met SIRS criteria with elevated fever of 103.1, increased heart rate, and leukocytosis of 13.3 blood cultures x 2, urine culture are in process. A source is unclear at this time however I do suspect it is the right knee though there is no recent trauma or IV injection or treatment Orthopedic provider recommends MRI of the knee. MRI right knee w wo CM ordered Lactic acid initial value is 0.9 Maintain MAP greater than 65 Will follow a second lactic acid Continue with broad-spectrum antibiotic, vancomycin , cefepime , metronidazole 

## 2024-08-11 NOTE — Progress Notes (Signed)
 ED Pharmacy Antibiotic Sign Off An antibiotic consult was received from an ED provider for cefepime  and vancomycin  per pharmacy dosing for sepsis. A chart review was completed to assess appropriateness.  A single dose of flagyl  placed by the ED provider.   The following one time order(s) were placed per pharmacy consult:  cefepime  2000 mg  q8h vancomycin  1500 mg x 1 dose  Further antibiotic and/or antibiotic pharmacy consults should be ordered by the admitting provider if indicated.   Thank you for allowing pharmacy to be a part of this patient's care.   Dorn Buttner, PharmD, BCPS 08/11/2024 11:45 AM ED Clinical Pharmacist -  [phone]

## 2024-08-11 NOTE — ED Provider Notes (Signed)
 " [place] EMERGENCY DEPARTMENT AT [facility] Provider Note   CSN: [id number] Arrival date & time: 08/11/24  9195     Patient presents with: Knee Pain   Cassidy Olsen is a 64 y.o. female.   HPI    64 year old female with a history of hypertension, hyperlipidemia, interstitial cystitis, IBS, asthma, ADHD, arthritis, followed by [facility] for chronic knee pain, occipital neuralgia, failed back surgery syndrome, who presents with concern for right knee pain and fever.    Friday was doing exercises with right knee and felt sudden twinge of pain but developed worsening swelling Over weekend the pain and swelling increased, severe pain Fever started last night, felt hot. No cough, congestion, body aches other than shoulder pain which thinks from exercise, no sore throat No nausea, vomiting, urinary symptoms or rash, no diarrhea or abdominal pain  No falls or trauma No known sick contacts. No hx of prior joint infection, IVDU, bacteremia Has hx of prior knee surgery    She currently takes tramadol , Celebrex , Voltaren and lidocaine , gabapentin  and Robaxin   Past Medical History:  Diagnosis Date   ADHD    Arthritis 2010   Asthma    Back pain    Chronic headaches    Chronic pain    Constipation    Edema of both lower extremities    Family history of breast cancer    Family history of prostate cancer    Gallstones 2018   GERD (gastroesophageal reflux disease)    High cholesterol    History of kidney stones    Hx: UTI (urinary tract infection)    Hypertension    IBS (irritable bowel syndrome)    Interstitial cystitis 2012   Joint pain    Kidney problem    Migraine    Occipital neuralgia    Osteoarthritis    PNA (pneumonia) 2018   Posttraumatic muscle contracture    Pre-diabetes    Spinal stenosis    Trigeminal neuralgia      Prior to Admission medications  Medication Sig Start Date End Date Taking? Authorizing Provider  ACETAMINOPHEN  PO  Take 1,300 mg by mouth 2 (two) times daily.   Yes [provider]  albuterol  (VENTOLIN  HFA) 108 (90 Base) MCG/ACT inhaler Inhale 2 puffs into the lungs every 6 (six) hours as needed for wheezing or shortness of breath. 06/02/22  Yes Icard, Bradley L, DO  amitriptyline  (ELAVIL ) 50 MG tablet TAKE 1 TABLET BY MOUTH EVERYDAY AT BEDTIME 02/23/24  Yes Ines Onetha NOVAK, MD  amLODipine  (NORVASC ) 2.5 MG tablet Take 1 tablet by mouth daily. 12/11/23  Yes [provider]  atorvastatin  (LIPITOR) 20 MG tablet Take 1 tablet (20 mg total) by mouth daily. 07/30/24  Yes Court Dorn PARAS, MD  botulinum toxin Type A  (BOTOX ) 200 units injection INJECT 155 UNITS INTRAMUSCULARLY EVERY 12 WEEKS (GIVEN AT MD  OFFICE, DISCARD UNUSED) 06/18/24  Yes Lomax, Amy, NP  cetirizine (ZYRTEC) 10 MG tablet Take 10 mg by mouth daily.   Yes [provider]  dexlansoprazole  (DEXILANT ) 60 MG capsule Take 1 capsule (60 mg total) by mouth daily. 02/21/24  Yes Nandigam, Kavitha V, MD  dicyclomine  (BENTYL ) 10 MG capsule Take 2 capsules (20 mg total) by mouth in the morning and at bedtime. PRN 02/21/24  Yes Nandigam, Kavitha V, MD  ELDERBERRY PO ELDERBERRY IMMUNE SUPPORT   Yes [provider]  fluticasone  (FLONASE ) 50 MCG/ACT nasal spray Place 2 sprays into both nostrils daily. 06/08/20  Yes Icard,  Bradley L, DO  fluticasone -salmeterol (WIXELA INHUB) 250-50 MCG/ACT AEPB Inhale 1 puff into the lungs in the morning and at bedtime. 07/30/24  Yes Hope Almarie ORN, NP  gabapentin  (NEURONTIN ) 300 MG capsule Take 3 capsules (900 mg total) by mouth 3 (three) times daily. 05/24/22  Yes Lomax, Amy, NP  lidocaine  4 % Place 1 patch onto the skin as needed.   Yes [provider]  lisinopril  (ZESTRIL ) 20 MG tablet Take 20 mg by mouth 2 (two) times daily. 06/03/24  Yes [provider]  lubiprostone  (AMITIZA ) 24 MCG capsule TAKE 1 CAPSULE (24 MCG TOTAL) BY MOUTH 2 (TWO) TIMES DAILY WITH A MEAL. 05/22/24  Yes  Nandigam, Kavitha V, MD  montelukast  (SINGULAIR ) 10 MG tablet TAKE 1 TABLET BY MOUTH EVERYDAY AT BEDTIME 07/29/24  Yes Hunsucker, Donnice SAUNDERS, MD  MOVANTIK  25 MG TABS tablet TAKE 1 TABLET (25 MG TOTAL) BY MOUTH DAILY. 05/28/24  Yes Nandigam, Kavitha V, MD  Omega-3 Fatty Acids (OMEGA III EPA+DHA) 1000 MG CAPS Take by mouth. 09/22/22  Yes [provider]  ondansetron  (ZOFRAN ) 4 MG tablet Take 4 mg by mouth every 8 (eight) hours as needed. 08/02/24  Yes [provider]  Oregano Oil-Flaxseed Oil (OREGANO OIL IMMUNE SUPPORT) 50-25 MG CAPS    Yes [provider]  Spacer/Aero-Holding Chambers (AEROCHAMBER MV) inhaler Use as instructed 04/18/18  Yes Icard, Bradley L, DO  sucralfate  (CARAFATE ) 1 g tablet Take 1 tablet (1 g total) by mouth 4 (four) times daily -  with meals and at bedtime. 08/06/24  Yes Zehr, Jessica D, PA-C  Suzetrigine  50 MG TABS Take 50 mg by mouth in the morning and at bedtime. 08/06/24 09/05/24 Yes [provider]  timolol  (TIMOPTIC ) 0.5 % ophthalmic solution 1-2 drops eyes at onset and 30 minutes after if needed. Can repeat in 2 hours for headache/migraine. 11/06/23  Yes Ines Onetha NOVAK, MD  traMADol  (ULTRAM ) 50 MG tablet Take 100 mg by mouth 3 (three) times daily. 2x daily   Yes [provider]  valACYclovir  (VALTREX ) 500 MG tablet Take 500 mg by mouth 2 (two) times daily as needed (outbreaks).    Yes [provider]  Vitamin D , Cholecalciferol , 25 MCG (1000 UT) CAPS Take 1 capsule by mouth daily.   Yes [provider]  celecoxib  (CELEBREX ) 200 MG capsule Take 200 mg by mouth 2 (two) times daily. Patient not taking: Reported on 08/11/2024    [provider]  Fremanezumab -vfrm (AJOVY ) 225 MG/1.5ML SOAJ Inject 225 mg into the skin every 30 (thirty) days. 10/25/23   Lomax, Amy, NP  glycopyrrolate  (ROBINUL ) 2 MG tablet Take 1 tablet (2 mg total) by mouth 2 (two) times daily. 04/19/23   Kerman Vina HERO, NP    Allergies:  Aspirin, Ketorolac , Ketorolac  tromethamine , Penicillins, Codeine, Ibuprofen, Latex, Omeprazole magnesium, Pantoprazole  sodium, Penicillamine, Pregabalin, Phenazopyridine , and Pyridium  [phenazopyridine  hcl]    Review of Systems  Updated Vital Signs BP 126/82 (BP Location: Left Arm)   Pulse (!) 110   Temp 99.3 F (37.4 C) (Oral)   Resp 20   Ht 5' 3 (1.6 m)   Wt 83.1 kg   SpO2 100%   BMI 32.46 kg/m   Physical Exam Vitals and nursing note reviewed.  Constitutional:      General: She is not in acute distress.    Appearance: She is well-developed. She is not diaphoretic.  HENT:     Head: Normocephalic and atraumatic.  Eyes:     Conjunctiva/sclera: Conjunctivae normal.  Cardiovascular:     Rate and Rhythm: Regular rhythm. Tachycardia present.     Heart sounds: Normal heart sounds. No murmur heard.    No friction rub. No gallop.  Pulmonary:     Effort: Pulmonary effort is normal. No respiratory distress.     Breath sounds: Normal breath sounds. No wheezing or rales.  Abdominal:     General: There is no distension.     Palpations: Abdomen is soft.     Tenderness: There is no abdominal tenderness. There is no guarding.  Musculoskeletal:        General: Swelling (right knee) and tenderness present.     Cervical back: Normal range of motion.     Comments: Limited ROM right knee, severe pain with passive ROM  Skin:    General: Skin is warm and dry.     Findings: No erythema or rash.  Neurological:     Mental Status: She is alert and oriented to person, place, and time.     (all labs ordered are listed, but only abnormal results are displayed) Labs Reviewed  CBC WITH DIFFERENTIAL/PLATELET - Abnormal; Notable for the following components:      Result Value   WBC 13.3 (*)    Neutro Abs 9.7 (*)    Monocytes Absolute 1.1 (*)    All other components within normal limits  COMPREHENSIVE METABOLIC PANEL WITH GFR - Abnormal; Notable for the following components:   AST 60 (*)     ALT 45 (*)    All other components within normal limits  SEDIMENTATION RATE - Abnormal; Notable for the following components:   Sed Rate 49 (*)    All other components within normal limits  LACTIC ACID, PLASMA - Abnormal; Notable for the following components:   Lactic Acid, Venous 2.7 (*)    All other components within normal limits  RESP PANEL BY RT-PCR (RSV, FLU A&B, COVID)  [id number]  CULTURE, BLOOD (ROUTINE X 2)  CULTURE, BLOOD (ROUTINE X 2)  URINE CULTURE  BODY FLUID CULTURE W GRAM STAIN  URINALYSIS, ROUTINE W REFLEX MICROSCOPIC  LACTIC ACID, PLASMA  GLUCOSE, BODY FLUID OTHER            PROTEIN, BODY FLUID (OTHER)  SYNOVIAL CELL COUNT + DIFF, W/ CRYSTALS  C-REACTIVE PROTEIN  BASIC METABOLIC PANEL WITH GFR  CBC  LACTIC ACID, PLASMA    EKG: None  Radiology: DG Chest Portable 1 View Result Date: 08/11/2024 CLINICAL DATA:  Fever EXAM: PORTABLE CHEST 1 VIEW COMPARISON:  June 07, 2024 FINDINGS: The heart size and mediastinal contours are within normal limits. Both lungs are clear. The visualized skeletal structures are unremarkable. IMPRESSION: No active disease. Electronically Signed   By: Lynwood Landy Raddle M.D.   On: 08/11/2024 10:54   DG Knee Complete 4 Views Right Result Date: 08/11/2024 EXAM: 4 OR MORE VIEW(S) XRAY OF THE RIGHT KNEE 08/11/2024 08:43:40 AM COMPARISON: None available. CLINICAL HISTORY: pain pain FINDINGS: BONES AND JOINTS: Status post right total knee arthroplasty. No acute fracture. No malalignment. There is a mild suprapatellar bursal effusion. SOFT TISSUES: The soft tissues are unremarkable. IMPRESSION: 1. Status post right total knee arthroplasty. 2. Mild suprapatellar bursal effusion. Electronically signed by: Evalene Coho MD 08/11/2024 08:48 AM EST RP Workstation: [id number]     Procedures   Medications Ordered in the ED  ceFEPIme  (MAXIPIME ) 2 g in sodium chloride  0.9 % 100 mL IVPB (2 g Intravenous New Bag/Given 08/11/24 2032)  celecoxib  (CELEBREX )  capsule 200 mg (200  mg Oral Not Given 08/11/24 2102)  traMADol  (ULTRAM ) tablet 100 mg (100 mg Oral Given 08/11/24 2035)  atorvastatin  (LIPITOR) tablet 20 mg (20 mg Oral Given 08/11/24 2037)  amitriptyline  (ELAVIL ) tablet 50 mg (50 mg Oral Given 08/11/24 2038)  pantoprazole  (PROTONIX ) EC tablet 40 mg (has no administration in time range)  lubiprostone  (AMITIZA ) capsule 24 mcg (24 mcg Oral Given 08/11/24 1746)  naloxegol  oxalate (MOVANTIK ) tablet 25 mg (25 mg Oral Given 08/11/24 2035)  gabapentin  (NEURONTIN ) capsule 900 mg (900 mg Oral Given 08/11/24 2037)  cholecalciferol  (VITAMIN D3) 25 MCG (1000 UNIT) tablet 1,000 Units (1,000 Units Oral Given 08/11/24 2037)  albuterol  (PROVENTIL ) (2.5 MG/3ML) 0.083% nebulizer solution 3 mL (has no administration in time range)  fluticasone  (FLONASE ) 50 MCG/ACT nasal spray 2 spray (has no administration in time range)  fluticasone  furoate-vilanterol (BREO ELLIPTA ) 200-25 MCG/ACT 1 puff (has no administration in time range)  montelukast  (SINGULAIR ) tablet 10 mg (10 mg Oral Given 08/11/24 2037)  timolol  (TIMOPTIC ) 0.5 % ophthalmic solution 1 drop (has no administration in time range)  lactated ringers  infusion (150 mL/hr Intravenous New Bag/Given 08/11/24 2040)  acetaminophen  (TYLENOL ) tablet 650 mg (has no administration in time range)    Or  acetaminophen  (TYLENOL ) suppository 650 mg (has no administration in time range)  ondansetron  (ZOFRAN ) tablet 4 mg (has no administration in time range)    Or  ondansetron  (ZOFRAN ) injection 4 mg (has no administration in time range)  heparin  injection 5,000 Units (5,000 Units Subcutaneous Given 08/11/24 2037)  metroNIDAZOLE  (FLAGYL ) IVPB 500 mg (500 mg Intravenous New Bag/Given 08/11/24 2104)  hydrALAZINE  (APRESOLINE ) injection 5 mg (has no administration in time range)  morphine  (PF) 4 MG/ML injection 4 mg (has no administration in time range)  vancomycin  (VANCOREADY) IVPB 1500 mg/300 mL (has no administration in time range)  dicyclomine   (BENTYL ) capsule 20 mg (has no administration in time range)  sucralfate  (CARAFATE ) tablet 1 g (1 g Oral Given 08/11/24 2035)  amLODipine  (NORVASC ) tablet 2.5 mg (has no administration in time range)  acetaminophen  (TYLENOL ) tablet 1,000 mg (1,000 mg Oral Given 08/11/24 0911)  morphine  (PF) 4 MG/ML injection 4 mg (4 mg Intravenous Given 08/11/24 0906)  ondansetron  (ZOFRAN ) injection 4 mg (4 mg Intravenous Given 08/11/24 0906)  lactated ringers  bolus 1,000 mL (0 mLs Intravenous Stopped 08/11/24 1130)  lidocaine -EPINEPHrine  (XYLOCAINE  W/EPI) 2 %-1:200000 (PF) injection 10 mL (10 mLs Intradermal Given 08/11/24 0912)  metroNIDAZOLE  (FLAGYL ) IVPB 500 mg (0 mg Intravenous Stopped 08/11/24 1246)  vancomycin  (VANCOCIN ) IVPB 1000 mg/200 mL premix (1,000 mg Intravenous New Bag/Given 08/11/24 1317)    Followed by  vancomycin  (VANCOCIN ) 500 mg in sodium chloride  0.9 % 100 mL IVPB ( Intravenous Restarted 08/11/24 1439)  lactated ringers  bolus 500 mL (500 mLs Intravenous New Bag/Given 08/11/24 2008)                                     64 year old female with a history of hypertension, hyperlipidemia, interstitial cystitis, IBS, asthma, ADHD, arthritis, followed by [facility] for chronic knee pain, occipital neuralgia, failed back surgery syndrome, who presents with concern for right knee pain and fever.   DDx includes septic arthritis, or other infectious source of fever such as influenza, UTI with MSK knee pain.   Arrives febrile and tachycardic.  Will order labs including blood cultures, UA, and influenza/covid/rsv testing.  Labs completed and personally evaluated interpreted by me.  Her COVID, influenza and RSV testing are negative and not the source of her fever.  She has a mild transaminitis, but no significant abdominal pain to suggest intra-abdominal etiology of fever.  Her ESR is elevated.  CRP is pending.  White blood cell count is 13,300.  Her lactic acid is within normal limits.  Her urinalysis shows  no signs of infection.  Chest x-ray obtained and evaluated by me shows no evidence of pneumonia, pneumothorax or other abnormalities. She developed hypoxia while sleeping after pain medications and suspect sleep apnea rather than occult pneumonia but clinical hx.  X-ray of the right knee shows suprapatellar bursal effusion.  She has significant pain with active and passive range of motion of her knee and swelling.  She does not have erythema of her knee or no risk factors for septic arthritis, but in the setting of fever without a source and with her only symptom being knee pain, have concern for possible septic arthritis.  Attempted an arthrocentesis x 2, but unable to get any fluid.  I discussed with orthopedics on-call Dr. Beuford who will evaluate her after she is admitted to [facility].  Will admit to the hospital with concern for fever of unknown source, and given leukocytosis, initial tachycardia and fever with concern for possible sepsis.  Ordered blood cx/IV antibiotics.      Final diagnoses:  Fever, unspecified fever cause  Sepsis without acute organ dysfunction, due to unspecified organism [facility])  Acute pain of right knee    ED Discharge Orders     None          Dreama Longs, MD 08/11/24 2155  "

## 2024-08-11 NOTE — Assessment & Plan Note (Signed)
Amitriptyline 50mg  nightly

## 2024-08-12 ENCOUNTER — Encounter (HOSPITAL_BASED_OUTPATIENT_CLINIC_OR_DEPARTMENT_OTHER): Admitting: Pulmonary Disease

## 2024-08-12 ENCOUNTER — Encounter (HOSPITAL_BASED_OUTPATIENT_CLINIC_OR_DEPARTMENT_OTHER)

## 2024-08-12 DIAGNOSIS — K581 Irritable bowel syndrome with constipation: Secondary | ICD-10-CM | POA: Diagnosis not present

## 2024-08-12 DIAGNOSIS — R6510 Systemic inflammatory response syndrome (SIRS) of non-infectious origin without acute organ dysfunction: Secondary | ICD-10-CM

## 2024-08-12 DIAGNOSIS — M25561 Pain in right knee: Secondary | ICD-10-CM | POA: Diagnosis not present

## 2024-08-12 DIAGNOSIS — M5481 Occipital neuralgia: Secondary | ICD-10-CM

## 2024-08-12 DIAGNOSIS — E782 Mixed hyperlipidemia: Secondary | ICD-10-CM | POA: Diagnosis not present

## 2024-08-12 DIAGNOSIS — G500 Trigeminal neuralgia: Secondary | ICD-10-CM

## 2024-08-12 DIAGNOSIS — K219 Gastro-esophageal reflux disease without esophagitis: Secondary | ICD-10-CM

## 2024-08-12 DIAGNOSIS — E669 Obesity, unspecified: Secondary | ICD-10-CM | POA: Diagnosis not present

## 2024-08-12 DIAGNOSIS — I10 Essential (primary) hypertension: Secondary | ICD-10-CM

## 2024-08-12 DIAGNOSIS — F32A Depression, unspecified: Secondary | ICD-10-CM | POA: Diagnosis not present

## 2024-08-12 LAB — CBC
HCT: 33.3 % — ABNORMAL LOW (ref 36.0–46.0)
Hemoglobin: 11.3 g/dL — ABNORMAL LOW (ref 12.0–15.0)
MCH: 32.7 pg (ref 26.0–34.0)
MCHC: 33.9 g/dL (ref 30.0–36.0)
MCV: 96.2 fL (ref 80.0–100.0)
Platelets: 310 K/uL (ref 150–400)
RBC: 3.46 MIL/uL — ABNORMAL LOW (ref 3.87–5.11)
RDW: 13.6 % (ref 11.5–15.5)
WBC: 8.1 K/uL (ref 4.0–10.5)
nRBC: 0 % (ref 0.0–0.2)

## 2024-08-12 LAB — BASIC METABOLIC PANEL WITH GFR
Anion gap: 9 (ref 5–15)
BUN: 10 mg/dL (ref 8–23)
CO2: 24 mmol/L (ref 22–32)
Calcium: 8.9 mg/dL (ref 8.9–10.3)
Chloride: 105 mmol/L (ref 98–111)
Creatinine, Ser: 0.88 mg/dL (ref 0.44–1.00)
GFR, Estimated: >60 mL/min
Glucose, Bld: 132 mg/dL — ABNORMAL HIGH (ref 70–99)
Potassium: 3.7 mmol/L (ref 3.5–5.1)
Sodium: 137 mmol/L (ref 135–145)

## 2024-08-12 LAB — URINE CULTURE

## 2024-08-12 LAB — LACTIC ACID, PLASMA: Lactic Acid, Venous: 0.8 mmol/L (ref 0.5–1.9)

## 2024-08-12 MED ORDER — OXYCODONE HCL 5 MG PO TABS
5.0000 mg | ORAL_TABLET | Freq: Four times a day (QID) | ORAL | Status: DC | PRN
  Administered 2024-08-12 – 2024-08-14 (×4): 5 mg via ORAL
  Filled 2024-08-12 (×4): qty 1

## 2024-08-12 MED ORDER — SUZETRIGINE 50 MG PO TABS
50.0000 mg | ORAL_TABLET | Freq: Two times a day (BID) | ORAL | Status: DC
  Administered 2024-08-12 – 2024-08-16 (×8): 50 mg via ORAL
  Filled 2024-08-12 (×3): qty 50
  Filled 2024-08-12 (×2): qty 1
  Filled 2024-08-12 (×3): qty 50
  Filled 2024-08-12 (×2): qty 1
  Filled 2024-08-12: qty 50

## 2024-08-12 NOTE — Progress Notes (Addendum)
 " PROGRESS NOTE    Cassidy Olsen  FMW:[id number] DOB: 30-May-1961 DOA: 08/11/2024 PCP: Perri Starleen BROCKS, MD    Chief Complaint  Patient presents with   Knee Pain    Brief Narrative:  Patient 64 year old female history of migraine headaches, trigeminal and occipital neuralgia, interstitial cystitis, hypertension, hyperlipidemia, IBS, ADHD history of right knee replacement, depression presented to the ED with right knee pain, noted to have a fever with a temp of 103.1.  Noted to have a leukocytosis.  Patient admitted for SIRS with concern for right knee infection.  MRI right knee ordered and pending.  Orthopedics consulted.  Patient placed on empiric IV antibiotics and pancultured.   Assessment & Plan:   Principal Problem:   SIRS (systemic inflammatory response syndrome) (HCC) Active Problems:   Trigeminal neuralgia of right side of face   Occipital neuralgia   Essential hypertension   Irritable bowel syndrome with constipation   Hyperlipidemia   Depression   Obesity (BMI 30-39.9)   Gastroesophageal reflux disease   Acute pain of right knee  #1 SIRS/right knee pain -Patient on presentation met criteria for SIRS with fever of 103.1, tachycardia, leukocytosis of 13.3. - Source unclear. - Patient with complaints of recent right knee pain. - Patient pancultured with blood cultures pending. - Chest x-ray negative for any acute infiltrate, patient with no pulmonary symptoms. - Urinalysis unremarkable. - Patient seen in consultation by orthopedics who feels a septic knee joint is highly unlikely as no fluid was aspirated after 2 attempts in the ED and recommending continuation of antibiotics and PT. - MRI of the right knee pending. - Continue empiric IV antibiotics of vancomycin , Flagyl , cefepime . - Supportive care.  2.  Hypertension -BP noted to be soft on admission and as such antihypertensive medications held. - Resume Norvasc  2.5 mg daily starting tomorrow 08/13/2024. - Hold other  antihypertensive medications.  3.  Trigeminal neuralgia of the right side of the face with migraines -Continue amitriptyline  50 mg nightly, timolol  as needed for migraine headaches. - Continue gabapentin  900 mg 3 times daily. - Tramadol  100 mg 3 times daily.  4.  GERD -PPI. - Carafate .  5.  Depression -Continue amitriptyline  50 mg nightly.  6.  Hyperlipidemia -Continue statin.  7.  IBS with constipation -Stable. - Continue Amitiza , Movantik .  8.  Obesity -BMI 32.46 kg/m - Lifestyle modification. - Outpatient follow-up with PCP.   DVT prophylaxis: Heparin  Code Status: Full Family Communication: Updated patient.  No family at bedside. Disposition: TBD  Status is: Inpatient Remains inpatient appropriate because: Severity of illness   Consultants:  Orthopedics: Dr. Beuford 08/11/2024  Procedures:  MRI knee pending Chest x-ray 08/11/2024 Plan films of the right knee 08/11/2024  Antimicrobials:  Anti-infectives (From admission, onward)    Start     Dose/Rate Route Frequency Ordered Stop   08/12/24 1500  vancomycin  (VANCOREADY) IVPB 1500 mg/300 mL        1,500 mg 150 mL/hr over 120 Minutes Intravenous Every 24 hours 08/11/24 1417 08/18/24 1459   08/11/24 2200  metroNIDAZOLE  (FLAGYL ) IVPB 500 mg        500 mg 100 mL/hr over 60 Minutes Intravenous Every 12 hours 08/11/24 1240 08/18/24 2159   08/11/24 1315  vancomycin  (VANCOCIN ) 500 mg in sodium chloride  0.9 % 100 mL IVPB       Placed in Followed by Linked Group   500 mg 100 mL/hr over 60 Minutes Intravenous  Once 08/11/24 1145 08/11/24 1524   08/11/24 1200  ceFEPIme  (MAXIPIME )  2 g in sodium chloride  0.9 % 100 mL IVPB        2 g 200 mL/hr over 30 Minutes Intravenous Every 8 hours 08/11/24 1145 08/18/24 1159   08/11/24 1145  vancomycin  (VANCOCIN ) IVPB 1000 mg/200 mL premix       Placed in Followed by Linked Group   1,000 mg 200 mL/hr over 60 Minutes Intravenous  Once 08/11/24 1145 08/11/24 1417   08/11/24 1130   metroNIDAZOLE  (FLAGYL ) IVPB 500 mg        500 mg 100 mL/hr over 60 Minutes Intravenous  Once 08/11/24 1119 08/11/24 1246         Subjective: Patient laying in bed.  With complaints of right knee pain ongoing.  Denies any chest pain or shortness of breath.  No cough.  No dysuria.  Stated had a low-grade temp overnight.  States was seen by orthopedics who does not feel the patient has a right knee infection.  Awaiting MRI of the right knee to be done.  Tmax 99 4.  Objective: Vitals:   08/11/24 2130 08/12/24 0205 08/12/24 0414 08/12/24 0812  BP: 126/82 98/62 113/75 110/70  Pulse: (!) 110 (!) 112 (!) 105 96  Resp: 20 20 20    Temp: 99.3 F (37.4 C) 99 F (37.2 C) 99.1 F (37.3 C) 98.2 F (36.8 C)  TempSrc: Oral Oral Oral Oral  SpO2: 100% 91% 92%   Weight:      Height:        Intake/Output Summary (Last 24 hours) at 08/12/2024 1014 Last data filed at 08/12/2024 0517 Gross per 24 hour  Intake 3327.39 ml  Output 600 ml  Net 2727.39 ml   Filed Weights   08/11/24 0939  Weight: 83.1 kg    Examination:  General exam: Appears calm and comfortable  Respiratory system: Clear to auscultation. Respiratory effort normal. Cardiovascular system: S1 & S2 heard, RRR. No JVD, murmurs, rubs, gallops or clicks. No pedal edema. Gastrointestinal system: Abdomen is nondistended, soft and nontender. No organomegaly or masses felt. Normal bowel sounds heard. Central nervous system: Alert and oriented. No focal neurological deficits. Extremities: Right knee with some warmth, tender to palpation, no significant erythema noted.  Skin: No rashes, lesions or ulcers Psychiatry: Judgement and insight appear normal. Mood & affect appropriate.     Data Reviewed: I have personally reviewed following labs and imaging studies  CBC: Recent Labs  Lab 08/11/24 0855 08/12/24 0239  WBC 13.3* 8.1  NEUTROABS 9.7*  --   HGB 13.2 11.3*  HCT 38.4 33.3*  MCV 94.8 96.2  PLT 349 310    Basic Metabolic  Panel: Recent Labs  Lab 08/11/24 0855 08/12/24 0239  NA 138 137  K 3.7 3.7  CL 100 105  CO2 27 24  GLUCOSE 94 132*  BUN 9 10  CREATININE 0.83 0.88  CALCIUM  9.9 8.9    GFR: Estimated Creatinine Clearance: 67.7 mL/min (by C-G formula based on SCr of 0.88 mg/dL).  Liver Function Tests: Recent Labs  Lab 08/11/24 0855  AST 60*  ALT 45*  ALKPHOS 88  BILITOT 0.7  PROT 7.3  ALBUMIN 4.1    CBG: No results for input(s): GLUCAP in the last 168 hours.   Recent Results (from the past 240 hours)  Blood culture (routine x 2)     Status: None (Preliminary result)   Collection Time: 08/11/24  8:55 AM   Specimen: BLOOD LEFT HAND  Result Value Ref Range Status   Specimen Description  Final    BLOOD LEFT HAND Performed at [facility], [address], [place], [place] [address]    Special Requests   Final    BOTTLES DRAWN AEROBIC AND ANAEROBIC Blood Culture adequate volume Performed at [facility], [address], [place], [place] [address]    Culture   Final    NO GROWTH < 24 HOURS Performed at [facility], [address] [place], [place] [address]    Report Status PENDING  Incomplete  Blood culture (routine x 2)     Status: None (Preliminary result)   Collection Time: 08/11/24  8:55 AM   Specimen: BLOOD  Result Value Ref Range Status   Specimen Description   Final    BLOOD RIGHT ANTECUBITAL Performed at [facility], [address] [place], [place] [address]    Special Requests   Final    BOTTLES DRAWN AEROBIC AND ANAEROBIC Blood Culture adequate volume Performed at [facility], [address], [place], [place] [address]    Culture   Final    NO GROWTH < 24 HOURS Performed at [facility], [address] [place], [place] [address]    Report Status PENDING  Incomplete  Resp panel by RT-PCR (RSV, Flu A&B, Covid) Peripheral     Status: None   Collection Time: 08/11/24   8:55 AM   Specimen: Peripheral; Nasal Swab  Result Value Ref Range Status   SARS Coronavirus 2 by RT PCR NEGATIVE NEGATIVE Final    Comment: (NOTE) SARS-CoV-2 target nucleic acids are NOT DETECTED.  The SARS-CoV-2 RNA is generally detectable in upper respiratory specimens during the acute phase of infection. The lowest concentration of SARS-CoV-2 viral copies this assay can detect is 138 copies/mL. A negative result does not preclude SARS-Cov-2 infection and should not be used as the sole basis for treatment or other patient management decisions. A negative result may occur with  improper specimen collection/handling, submission of specimen other than nasopharyngeal swab, presence of viral mutation(s) within the areas targeted by this assay, and inadequate number of viral copies(<138 copies/mL). A negative result must be combined with clinical observations, patient history, and epidemiological information. The expected result is Negative.  Fact Sheet for Patients:  bloggercourse.com  Fact Sheet for Healthcare Providers:  seriousbroker.it  This test is no t yet approved or cleared by the [place]  FDA and  has been authorized for detection and/or diagnosis of SARS-CoV-2 by FDA under an Emergency Use Authorization (EUA). This EUA will remain  in effect (meaning this test can be used) for the duration of the COVID-19 declaration under Section 564(b)(1) of the Act, 21 U.S.C.section 360bbb-3(b)(1), unless the authorization is terminated  or revoked sooner.       Influenza A by PCR NEGATIVE NEGATIVE Final   Influenza B by PCR NEGATIVE NEGATIVE Final    Comment: (NOTE) The Xpert Xpress SARS-CoV-2/FLU/RSV plus assay is intended as an aid in the diagnosis of influenza from Nasopharyngeal swab specimens and should not be used as a sole basis for treatment. Nasal washings and aspirates are unacceptable for Xpert Xpress  SARS-CoV-2/FLU/RSV testing.  Fact Sheet for Patients: bloggercourse.com  Fact Sheet for Healthcare Providers: seriousbroker.it  This test is not yet approved or cleared by the [place]  FDA and has been authorized for detection and/or diagnosis of SARS-CoV-2 by FDA under an Emergency Use Authorization (EUA). This EUA will remain in effect (meaning this test can be used) for the duration of the  COVID-19 declaration under Section 564(b)(1) of the Act, 21 U.S.C. section 360bbb-3(b)(1), unless the authorization is terminated or revoked.     Resp Syncytial Virus by PCR NEGATIVE NEGATIVE Final    Comment: (NOTE) Fact Sheet for Patients: bloggercourse.com  Fact Sheet for Healthcare Providers: seriousbroker.it  This test is not yet approved or cleared by the [place]  FDA and has been authorized for detection and/or diagnosis of SARS-CoV-2 by FDA under an Emergency Use Authorization (EUA). This EUA will remain in effect (meaning this test can be used) for the duration of the COVID-19 declaration under Section 564(b)(1) of the Act, 21 U.S.C. section 360bbb-3(b)(1), unless the authorization is terminated or revoked.  Performed at [facility], [address] [place], [place] [address]          Radiology Studies: DG Chest Portable 1 View Result Date: 08/11/2024 CLINICAL DATA:  Fever EXAM: PORTABLE CHEST 1 VIEW COMPARISON:  June 07, 2024 FINDINGS: The heart size and mediastinal contours are within normal limits. Both lungs are clear. The visualized skeletal structures are unremarkable. IMPRESSION: No active disease. Electronically Signed   By: Lynwood Landy Raddle M.D.   On: 08/11/2024 10:54   DG Knee Complete 4 Views Right Result Date: 08/11/2024 EXAM: 4 OR MORE VIEW(S) XRAY OF THE RIGHT KNEE 08/11/2024 08:43:40 AM COMPARISON: None available. CLINICAL  HISTORY: pain pain FINDINGS: BONES AND JOINTS: Status post right total knee arthroplasty. No acute fracture. No malalignment. There is a mild suprapatellar bursal effusion. SOFT TISSUES: The soft tissues are unremarkable. IMPRESSION: 1. Status post right total knee arthroplasty. 2. Mild suprapatellar bursal effusion. Electronically signed by: Timothy Berrigan MD 08/11/2024 08:48 AM EST RP Workstation: [id number]        Scheduled Meds:  amitriptyline   50 mg Oral QHS   amLODipine   2.5 mg Oral Daily   atorvastatin   20 mg Oral QHS   celecoxib   200 mg Oral BID   cholecalciferol   1,000 Units Oral QHS   fluticasone   2 spray Each Nare Daily   fluticasone  furoate-vilanterol  1 puff Inhalation Daily   gabapentin   900 mg Oral TID   heparin   5,000 Units Subcutaneous Q8H   lubiprostone   24 mcg Oral BID WC   montelukast   10 mg Oral QHS   naloxegol  oxalate  25 mg Oral QHS   pantoprazole   40 mg Oral Daily   sucralfate   1 g Oral TID WC & HS   traMADol   100 mg Oral TID   Continuous Infusions:  ceFEPime  (MAXIPIME ) IV 200 mL/hr at 08/12/24 0517   metronidazole  Stopped (08/11/24 2205)   vancomycin        LOS: 1 day    Time spent: 40 minutes    Toribio Hummer, MD [facility]   To contact the attending provider between 7A-7P or the covering provider during after hours 7P-7A, please log into the web site [email or web address] and access using universal [place] password for that web site. If you do not have the password, please call the hospital operator.  08/12/2024, 10:14 AM    "

## 2024-08-12 NOTE — Telephone Encounter (Signed)
 Cassidy Olsen, when I went to call her and check on her, looks like she is currently admitted to the hospital with SIRS.

## 2024-08-12 NOTE — Evaluation (Addendum)
 Occupational Therapy Evaluation Patient Details Name: Cassidy Olsen MRN: [id number] DOB: 02/25/1961 Today's Date: 08/12/2024   History of Present Illness   64 year old female presenting to the ED with a 2-day history of worsening right knee pain, warmth, erythema, subjective fevers, inability to ambulate on right lower extremity x 1 day. history of migraine headaches, trigeminal and occipital neuralgia, interstitial cystitis, hypertension, hyperlipidemia, IBS, ADHD, history of right knee replacement 2015.     Clinical Impressions Pt admitted with the above concerns. Pt currently with functional limitations due to the deficits listed below (see OT Problem List). Prior to admit, pt was living alone and independent with all ADL's and functional mobility. Pt works part time in a food truck. Pt will benefit from acute skilled OT to increase their safety and independence with ADL and functional mobility for ADL to facilitate discharge. Provided some patient education on activity modifications and AE/DME to increase independence with ADL's while experiencing Right knee pain. Acute OT will plan to work with pt while admitted so do not foresee the need for any follow up OT services.       If plan is discharge home, recommend the following:   A little help with walking and/or transfers;A little help with bathing/dressing/bathroom;Help with stairs or ramp for entrance     Functional Status Assessment   Patient has had a recent decline in their functional status and demonstrates the ability to make significant improvements in function in a reasonable and predictable amount of time.     Equipment Recommendations   Other (comment) (reacher, sock aid, long handled sponge, and suction cup shower head holder. Pt informed of where to purchase items.)      Precautions/Restrictions   Precautions Precautions: Fall Recall of Precautions/Restrictions: Intact Restrictions Weight Bearing  Restrictions Per Provider Order: No     Mobility Bed Mobility    General bed mobility comments: up in recliner upon therapy arrival    Transfers Overall transfer level: Needs assistance Equipment used: Rolling walker (2 wheels) Transfers: Sit to/from Stand, Bed to chair/wheelchair/BSC Sit to Stand: Supervision     Step pivot transfers: Supervision     Balance Overall balance assessment: Mild deficits observed, not formally tested, History of Falls              ADL either performed or assessed with clinical judgement   ADL      Lower Body Bathing: Maximal assistance;Sitting/lateral leans       Lower Body Dressing: Maximal assistance;Sitting/lateral leans   Toilet Transfer: Supervision/safety;Ambulation;BSC/3in1;Rolling walker (2 wheels)   Toileting- Clothing Manipulation and Hygiene: Minimal assistance;Sitting/lateral lean  General ADL Comments: Pt educated on use of gait belt for leg lifter during bed mobility, shower transfers, etc. Education provided on use of reacher, sock aid to assist with LB ADL's, safe shower transfer with use of shower chair and how to complete without stepping into tub. Recommend use of suction cup shower head holder to allow pt to sit while bathing and access shower head.     Vision Baseline Vision/History: 0 No visual deficits Ability to See in Adequate Light: 0 Adequate Patient Visual Report: No change from baseline Vision Assessment?: No apparent visual deficits     Perception Perception: Within Functional Limits       Praxis Praxis: WFL       Pertinent Vitals/Pain Pain Assessment Pain Assessment: Faces Faces Pain Scale: Hurts whole lot Pain Location: R knee Pain Descriptors / Indicators: Grimacing, Discomfort, Sore Pain Intervention(s): Limited activity within patient's tolerance,  Monitored during session, Repositioned, Ice applied     Extremity/Trunk Assessment Upper Extremity Assessment Upper Extremity Assessment:  Overall WFL for tasks assessed   Lower Extremity Assessment Lower Extremity Assessment: Defer to PT evaluation RLE Deficits / Details: knee AAROM ~20-70* limited by pain, able to actively extend knee to -20* in sitting RLE Sensation: WNL RLE Coordination: WNL   Cervical / Trunk Assessment Cervical / Trunk Assessment: Normal   Communication Communication Communication: No apparent difficulties   Cognition Arousal: Alert Behavior During Therapy: WFL for tasks assessed/performed Cognition: No apparent impairments   Following commands: Intact       Cueing  General Comments   Cueing Techniques: Verbal cues  increased right knee swelling present           Home Living Family/patient expects to be discharged to:: Private residence Living Arrangements: Alone Available Help at Discharge: Family;Available PRN/intermittently Type of Home: House Home Access: Stairs to enter [employer] of Steps: 4 Entrance Stairs-Rails: Can reach both;Right;Left Home Layout: One level     Bathroom Shower/Tub: Chief Strategy Officer: Standard     Home Equipment: Agricultural Consultant (2 wheels);Toilet riser;Shower seat          Prior Functioning/Environment Prior Level of Function : Independent/Modified Independent;History of Falls (last six months)    Mobility Comments: walks without AD at baseline, works part time on a food truck ADLs Comments: independent    OT Problem List: Pain;Decreased knowledge of use of DME or AE;Impaired balance (sitting and/or standing)   OT Treatment/Interventions: Self-care/ADL training;Therapeutic exercise;Therapeutic activities;Patient/family education;Balance training;DME and/or AE instruction      OT Goals(Current goals can be found in the care plan section)   Acute Rehab OT Goals Patient Stated Goal: to be as independent as possible OT Goal Formulation: With patient Time For Goal Achievement: 08/26/24 Potential to Achieve  Goals: Good   OT Frequency:  Min 2X/week       AM-PAC OT 6 Clicks Daily Activity     Outcome Measure Help from another person eating meals?: None Help from another person taking care of personal grooming?: None Help from another person toileting, which includes using toliet, bedpan, or urinal?: A Little Help from another person bathing (including washing, rinsing, drying)?: A Little Help from another person to put on and taking off regular upper body clothing?: None Help from another person to put on and taking off regular lower body clothing?: A Lot 6 Click Score: 20   End of Session Equipment Utilized During Treatment: Rolling walker (2 wheels);Gait belt  Activity Tolerance: Patient tolerated treatment well Patient left: with call bell/phone within reach;Other (comment) (on BSC)  OT Visit Diagnosis: Muscle weakness (generalized) (M62.81);History of falling (Z91.81);Pain Pain - Right/Left: Right Pain - part of body: Knee                Time: 8562-8486 OT Time Calculation (min): 36 min Charges:  OT General Charges $OT Visit: 1 Visit OT Evaluation $OT Eval Low Complexity: 1 Low OT Treatments $Self Care/Home Management : 8-22 mins  [name], OTR/L,CBIS  Supplemental OT - [facility] and [facility] Secure Chat Preferred    Juanangel Soderholm, Leita BIRCH 08/12/2024, 4:53 PM

## 2024-08-12 NOTE — Evaluation (Signed)
 Physical Therapy Evaluation Patient Details Name: Cassidy Olsen MRN: [id number] DOB: [birth year] Today's Date: 08/12/2024  History of Present Illness  64 year old female presenting to the ED with a 2-day history of worsening right knee pain, warmth, erythema, subjective fevers, inability to ambulate on right lower extremity x 1 day. history of migraine headaches, trigeminal and occipital neuralgia, interstitial cystitis, hypertension, hyperlipidemia, IBS, ADHD, history of right knee replacement 2015.  Clinical Impression  Pt admitted with above diagnosis. Pt ambulated 44' with RW, distance limited by R knee pain. At baseline she ambulates without an assistive device. R knee AROM limited to ~20-70*.  Pt currently with functional limitations due to the deficits listed below (see PT Problem List). Pt will benefit from acute skilled PT to increase their independence and safety with mobility to allow discharge.           If plan is discharge home, recommend the following: Help with stairs or ramp for entrance   Can travel by private vehicle        Equipment Recommendations None recommended by PT  Recommendations for Other Services       Functional Status Assessment Patient has had a recent decline in their functional status and demonstrates the ability to make significant improvements in function in a reasonable and predictable amount of time.     Precautions / Restrictions Precautions Precautions: Fall Recall of Precautions/Restrictions: Intact Precaution/Restrictions Comments: reports 1 fall in past 6 months (occurred while getting up during the night) Restrictions Weight Bearing Restrictions Per Provider Order: No      Mobility  Bed Mobility Overal bed mobility: Modified Independent             General bed mobility comments: HOB up, used rail    Transfers Overall transfer level: Needs assistance Equipment used: Rolling walker (2 wheels) Transfers: Sit to/from  Stand Sit to Stand: Contact guard assist           General transfer comment: VCs hand placement    Ambulation/Gait Ambulation/Gait assistance: Contact guard assist Gait Distance (Feet): 38 Feet Assistive device: Rolling walker (2 wheels) Gait Pattern/deviations: Step-to pattern, Decreased step length - right, Decreased step length - left, Decreased weight shift to right, Knee flexed in stance - right Gait velocity: decr     General Gait Details: distance limited by 10/10 R knee pain  Stairs            Wheelchair Mobility     Tilt Bed    Modified Rankin (Stroke Patients Only)       Balance Overall balance assessment: Modified Independent                                           Pertinent Vitals/Pain Pain Assessment Pain Assessment: 0-10 Pain Score: 10-Worst pain ever Pain Location: R knee and hip with walking Pain Descriptors / Indicators: Grimacing, Guarding, Sore, Burning Pain Intervention(s): Limited activity within patient's tolerance, Monitored during session, Patient requesting pain meds-RN notified, Ice applied, Repositioned    Home Living Family/patient expects to be discharged to:: Private residence Living Arrangements: Alone Available Help at Discharge: Family;Available PRN/intermittently   Home Access: Stairs to enter Entrance Stairs-Rails: Can reach both;Right;Left Entrance Stairs-Number of Steps: 4   Home Layout: One level Home Equipment: Agricultural [employer]ant (2 wheels);Toilet riser      Prior Function Prior Level of Function : Independent/Modified Independent  Mobility Comments: walks without AD at baseline, 1 fall in past 6 months, works part time on a food truck ADLs Comments: independent     Extremity/Trunk Assessment   Upper Extremity Assessment Upper Extremity Assessment: Overall WFL for tasks assessed    Lower Extremity Assessment Lower Extremity Assessment: RLE deficits/detail RLE Deficits /  Details: knee AAROM ~20-70* limited by pain, able to actively extend knee to -20* in sitting RLE Sensation: WNL RLE Coordination: WNL    Cervical / Trunk Assessment Cervical / Trunk Assessment: Normal  Communication   Communication Communication: No apparent difficulties    Cognition Arousal: Alert Behavior During Therapy: WFL for tasks assessed/performed   PT - Cognitive impairments: No apparent impairments                         Following commands: Intact       Cueing Cueing Techniques: Verbal cues     General Comments      Exercises     Assessment/Plan    PT Assessment Patient needs continued PT services  PT Problem List Decreased activity tolerance;Decreased range of motion;Decreased mobility;Pain       PT Treatment Interventions Gait training;Therapeutic exercise;Patient/family education;Therapeutic activities;Functional mobility training;DME instruction    PT Goals (Current goals can be found in the Care Plan section)  Acute Rehab PT Goals 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 PT Goal Formulation: With patient Time For Goal Achievement: 08/26/24 Potential to Achieve Goals: Good    Frequency Min 3X/week     Co-evaluation               AM-PAC PT 6 Clicks Mobility  Outcome Measure Help needed turning from your back to your side while in a flat bed without using bedrails?: None Help needed moving from lying on your back to sitting on the side of a flat bed without using bedrails?: None Help needed moving to and from a bed to a chair (including a wheelchair)?: A Little Help needed standing up from a chair using your arms (e.g., wheelchair or bedside chair)?: A Little Help needed to walk in hospital room?: A Little Help needed climbing 3-5 steps with a railing? : A Little 6 Click Score: 20    End of Session Equipment Utilized During Treatment: Gait belt Activity Tolerance: Patient limited by pain Patient  left: in chair;with call bell/phone within reach;with nursing/sitter in room Nurse Communication: Mobility status PT Visit Diagnosis: Difficulty in walking, not elsewhere classified (R26.2)    Time: 8768-8698 PT Time Calculation (min) (ACUTE ONLY): 30 min   Charges:   PT Evaluation $PT Eval Moderate Complexity: 1 Mod PT Treatments $Gait Training: 8-22 mins PT General Charges $$ ACUTE PT VISIT: 1 Visit        Sylvan Delon Copp PT 08/12/2024  Acute Rehabilitation Services  Office [phone]

## 2024-08-12 NOTE — Telephone Encounter (Signed)
 Ok, thank you. She will need visit before surgery for clearance due to recent hospitalization

## 2024-08-13 ENCOUNTER — Inpatient Hospital Stay (HOSPITAL_COMMUNITY)

## 2024-08-13 ENCOUNTER — Encounter (HOSPITAL_COMMUNITY): Payer: Self-pay | Admitting: Internal Medicine

## 2024-08-13 DIAGNOSIS — M25561 Pain in right knee: Secondary | ICD-10-CM | POA: Diagnosis not present

## 2024-08-13 DIAGNOSIS — I10 Essential (primary) hypertension: Secondary | ICD-10-CM | POA: Diagnosis not present

## 2024-08-13 DIAGNOSIS — E782 Mixed hyperlipidemia: Secondary | ICD-10-CM | POA: Diagnosis not present

## 2024-08-13 DIAGNOSIS — F32A Depression, unspecified: Secondary | ICD-10-CM | POA: Diagnosis not present

## 2024-08-13 DIAGNOSIS — M5481 Occipital neuralgia: Secondary | ICD-10-CM | POA: Diagnosis not present

## 2024-08-13 DIAGNOSIS — K219 Gastro-esophageal reflux disease without esophagitis: Secondary | ICD-10-CM | POA: Diagnosis not present

## 2024-08-13 DIAGNOSIS — E669 Obesity, unspecified: Secondary | ICD-10-CM | POA: Diagnosis not present

## 2024-08-13 DIAGNOSIS — R6510 Systemic inflammatory response syndrome (SIRS) of non-infectious origin without acute organ dysfunction: Secondary | ICD-10-CM | POA: Diagnosis not present

## 2024-08-13 DIAGNOSIS — G500 Trigeminal neuralgia: Secondary | ICD-10-CM | POA: Diagnosis not present

## 2024-08-13 DIAGNOSIS — K581 Irritable bowel syndrome with constipation: Secondary | ICD-10-CM | POA: Diagnosis not present

## 2024-08-13 LAB — BASIC METABOLIC PANEL WITH GFR
Anion gap: 11 (ref 5–15)
BUN: 9 mg/dL (ref 8–23)
CO2: 23 mmol/L (ref 22–32)
Calcium: 9.7 mg/dL (ref 8.9–10.3)
Chloride: 105 mmol/L (ref 98–111)
Creatinine, Ser: 0.82 mg/dL (ref 0.44–1.00)
GFR, Estimated: >60 mL/min
Glucose, Bld: 90 mg/dL (ref 70–99)
Potassium: 3.6 mmol/L (ref 3.5–5.1)
Sodium: 139 mmol/L (ref 135–145)

## 2024-08-13 LAB — CBC WITH DIFFERENTIAL/PLATELET
Abs Immature Granulocytes: 0.03 K/uL (ref 0.00–0.07)
Basophils Absolute: 0 K/uL (ref 0.0–0.1)
Basophils Relative: 1 %
Eosinophils Absolute: 0.3 K/uL (ref 0.0–0.5)
Eosinophils Relative: 4 %
HCT: 34.7 % — ABNORMAL LOW (ref 36.0–46.0)
Hemoglobin: 11.4 g/dL — ABNORMAL LOW (ref 12.0–15.0)
Immature Granulocytes: 0 %
Lymphocytes Relative: 21 %
Lymphs Abs: 1.5 K/uL (ref 0.7–4.0)
MCH: 32.4 pg (ref 26.0–34.0)
MCHC: 32.9 g/dL (ref 30.0–36.0)
MCV: 98.6 fL (ref 80.0–100.0)
Monocytes Absolute: 0.7 K/uL (ref 0.1–1.0)
Monocytes Relative: 11 %
Neutro Abs: 4.4 K/uL (ref 1.7–7.7)
Neutrophils Relative %: 63 %
Platelets: 312 K/uL (ref 150–400)
RBC: 3.52 MIL/uL — ABNORMAL LOW (ref 3.87–5.11)
RDW: 13.5 % (ref 11.5–15.5)
WBC: 7 K/uL (ref 4.0–10.5)
nRBC: 0 % (ref 0.0–0.2)

## 2024-08-13 LAB — URIC ACID: Uric Acid, Serum: 5.8 mg/dL (ref 2.5–7.1)

## 2024-08-13 MED ORDER — VALACYCLOVIR HCL 500 MG PO TABS: 500.0000 mg | ORAL_TABLET | Freq: Every day | ORAL | Status: DC

## 2024-08-13 MED ORDER — SENNOSIDES-DOCUSATE SODIUM 8.6-50 MG PO TABS
1.0000 | ORAL_TABLET | Freq: Two times a day (BID) | ORAL | Status: DC
  Administered 2024-08-13 – 2024-08-15 (×4): 1 via ORAL
  Filled 2024-08-13 (×4): qty 1

## 2024-08-13 MED ORDER — FUROSEMIDE 20 MG PO TABS: 20.0000 mg | ORAL_TABLET | ORAL | 0 refills | Status: AC | PRN

## 2024-08-13 MED ORDER — METHOCARBAMOL 1000 MG/10ML IJ SOLN
500.0000 mg | Freq: Once | INTRAMUSCULAR | Status: AC
  Administered 2024-08-13: 500 mg via INTRAVENOUS
  Filled 2024-08-13: qty 10

## 2024-08-13 MED ORDER — FREMANEZUMAB-VFRM 225 MG/1.5ML ~~LOC~~ SOAJ
225.0000 mg | SUBCUTANEOUS | Status: DC
  Administered 2024-08-13: 225 mg via SUBCUTANEOUS
  Filled 2024-08-13: qty 225
  Filled 2024-08-13: qty 1

## 2024-08-13 MED ORDER — GADOBUTROL 1 MMOL/ML IV SOLN
8.0000 mL | Freq: Once | INTRAVENOUS | Status: AC | PRN
  Administered 2024-08-13: 8 mL via INTRAVENOUS

## 2024-08-13 MED ORDER — POLYETHYLENE GLYCOL 3350 17 G PO PACK
17.0000 g | PACK | Freq: Two times a day (BID) | ORAL | Status: DC
  Administered 2024-08-13 – 2024-08-15 (×4): 17 g via ORAL
  Filled 2024-08-13 (×4): qty 1

## 2024-08-13 MED ORDER — VALACYCLOVIR HCL 500 MG PO TABS
500.0000 mg | ORAL_TABLET | Freq: Two times a day (BID) | ORAL | Status: DC | PRN
  Administered 2024-08-13: 500 mg via ORAL
  Filled 2024-08-13: qty 1

## 2024-08-13 MED ORDER — DEXLANSOPRAZOLE 60 MG PO CPDR
60.0000 mg | DELAYED_RELEASE_CAPSULE | Freq: Every day | ORAL | Status: DC
  Administered 2024-08-14 – 2024-08-16 (×3): 60 mg via ORAL
  Filled 2024-08-13 (×4): qty 1

## 2024-08-13 MED ORDER — MORPHINE SULFATE (PF) 2 MG/ML IV SOLN
1.0000 mg | INTRAVENOUS | Status: DC | PRN
  Administered 2024-08-14 (×2): 1 mg via INTRAVENOUS
  Filled 2024-08-13 (×2): qty 1

## 2024-08-13 MED ORDER — VALACYCLOVIR HCL 500 MG PO TABS
1000.0000 mg | ORAL_TABLET | Freq: Every day | ORAL | Status: DC
  Administered 2024-08-14 – 2024-08-16 (×3): 1000 mg via ORAL
  Filled 2024-08-13 (×3): qty 2

## 2024-08-13 NOTE — Progress Notes (Signed)
 I called and spoke to pt. Pt informed of Beth's note and verbalized understanding. Pt stated that she would like for us  to go ahead and send in the Lasix . I did go ahead and confirm the pharmacy. Sending this is. NFN

## 2024-08-13 NOTE — Progress Notes (Signed)
 Occupational Therapy Treatment/Discharge Patient Details Name: Cassidy Olsen MRN: [id number] DOB: Feb 13, 1961 Today's Date: 08/13/2024   History of present illness 64 year old female presenting to the ED with a 2-day history of worsening right knee pain, warmth, erythema, subjective fevers, inability to ambulate on right lower extremity x 1 day. history of migraine headaches, trigeminal and occipital neuralgia, interstitial cystitis, hypertension, hyperlipidemia, IBS, ADHD, history of right knee replacement 2015.   OT comments  Pt participated in OT treatment session focusing on functional mobility and ADL re-training with education on use of AE and activity modifications to increase overall independence with self care tasks. Pt completed shower seated at overall Mod I level. Utilized sock aid to complete sock management. Overall has progressed well with acute OT and has met all goals.        If plan is discharge home, recommend the following:  A little help with walking and/or transfers;A little help with bathing/dressing/bathroom;Help with stairs or ramp for entrance   Equipment Recommendations  None recommended by OT       Precautions / Restrictions Precautions Precautions: Fall Recall of Precautions/Restrictions: Intact Precaution/Restrictions Comments: reports 1 fall in past 6 months (occurred while getting up during the night) Restrictions Weight Bearing Restrictions Per Provider Order: No       Mobility Bed Mobility Overal bed mobility: Modified Independent     Transfers Overall transfer level: Modified independent Equipment used: Rolling walker (2 wheels) Transfers: Sit to/from Stand, Bed to chair/wheelchair/BSC Sit to Stand: Modified independent (Device/Increase time)     Step pivot transfers: Supervision           Balance Overall balance assessment: Mild deficits observed, not formally tested, History of Falls           ADL either performed or assessed with  clinical judgement   ADL       Grooming: Independent;Sitting   Upper Body Bathing: Independent;Sitting   Lower Body Bathing: Sitting/lateral leans;Minimal assistance (to wash right lower leg and foot)   Upper Body Dressing : Independent;Sitting (with sock aid)   Lower Body Dressing: Modified independent;With adaptive equipment;Sitting/lateral leans;Sit to/from stand           Tub/ Shower Transfer: Walk-in shower;Supervision/safety;Ambulation;Rolling walker (2 wheels);Grab bars (built in shower bench)                    Communication Communication Communication: No apparent difficulties   Cognition Arousal: Alert Behavior During Therapy: WFL for tasks assessed/performed Cognition: No apparent impairments     Following commands: Intact        Cueing   Cueing Techniques: Verbal cues             Pertinent Vitals/ Pain       Pain Assessment Pain Assessment: Faces Faces Pain Scale: Hurts whole lot Pain Location: R knee during movement and weight bearing Pain Descriptors / Indicators: Grimacing, Discomfort, Sore, Guarding Pain Intervention(s): Limited activity within patient's tolerance, Monitored during session, Repositioned         Frequency  Min 2X/week        Progress Toward Goals  OT Goals(current goals can now be found in the care plan section)  Progress towards OT goals: Goals met/education completed, patient discharged from OT            AM-PAC OT 6 Clicks Daily Activity     Outcome Measure   Help from another person eating meals?: None Help from another person taking care of personal grooming?: None Help from another  person toileting, which includes using toliet, bedpan, or urinal?: None Help from another person bathing (including washing, rinsing, drying)?: None Help from another person to put on and taking off regular upper body clothing?: None Help from another person to put on and taking off regular lower body clothing?: None 6  Click Score: 24    End of Session Equipment Utilized During Treatment: Rolling walker (2 wheels);Gait belt;Other (comment) (sock aid)  OT Visit Diagnosis: Muscle weakness (generalized) (M62.81);History of falling (Z91.81);Pain Pain - Right/Left: Right Pain - part of body: Knee   Activity Tolerance Patient tolerated treatment well   Patient Left in chair;with call bell/phone within reach           Time: 1139-1218 OT Time Calculation (min): 39 min  Charges: OT General Charges $OT Visit: 1 Visit OT Treatments $Self Care/Home Management : 38-52 mins  [name], OTR/L,CBIS  Supplemental OT - [facility] and [facility] Secure Chat Preferred    [name], Leita BIRCH 08/13/2024, 2:16 PM

## 2024-08-13 NOTE — Progress Notes (Signed)
 " PROGRESS NOTE    Cassidy Olsen  FMW:[id number] DOB: 1961/03/07 DOA: 08/11/2024 PCP: Perri Starleen BROCKS, MD    Chief Complaint  Patient presents with   Knee Pain    Brief Narrative:  Patient 64 year old female history of migraine headaches, trigeminal and occipital neuralgia, interstitial cystitis, hypertension, hyperlipidemia, IBS, ADHD history of right knee replacement, depression presented to the ED with right knee pain, noted to have a fever with a temp of 103.1.  Noted to have a leukocytosis.  Patient admitted for SIRS with concern for right knee infection.  MRI right knee ordered and pending.  Orthopedics consulted.  Patient placed on empiric IV antibiotics and pancultured.   Assessment & Plan:   Principal Problem:   SIRS (systemic inflammatory response syndrome) (HCC) Active Problems:   Trigeminal neuralgia of right side of face   Occipital neuralgia   Essential hypertension   Irritable bowel syndrome with constipation   Hyperlipidemia   Depression   Obesity (BMI 30-39.9)   Gastroesophageal reflux disease   Acute pain of right knee  #1 SIRS/right knee pain -Patient on presentation met criteria for SIRS with fever of 103.1, tachycardia, leukocytosis of 13.3. - Source unclear. -Fever curve trending down. -Leukocytosis trending down. - Patient with complaints of recent right knee pain. -Patient with complaints of left hip pain today. - Patient pancultured with blood cultures pending. - Chest x-ray negative for any acute infiltrate, patient with no pulmonary symptoms. - Urinalysis unremarkable. - Patient seen in consultation by orthopedics who feels a septic knee joint is highly unlikely as no fluid was aspirated after 2 attempts in the ED and recommending continuation of antibiotics and PT. - MRI of the right knee with no specific indication of septic arthritis or active osteomyelitis.  Small suprapatellar joint effusion with mild marginal synovial enhancement.  Status post  total knee arthroplasty with surgically absent menisci and presumed cruciate ligaments.  Substantial metal artifact and fluid heterogenicity from the knee prosthesis limited evaluation.  -Plain films of the left hip and pelvis with no acute findings, mild bilateral hip osteoarthritis.  - Continue empiric IV antibiotics of vancomycin , cefepime . -Discontinue Flagyl . - Supportive care.  2.  Hypertension -BP noted to be soft on admission and as such antihypertensive medications held. - Continue Norvasc  2.5 mg daily.  - Hold other antihypertensive medications.  3.  Trigeminal neuralgia of the right side of the face with migraines -Continue amitriptyline  50 mg nightly, timolol  as needed for migraine headaches. - Continue gabapentin  900 mg 3 times daily. - Tramadol  100 mg 3 times daily.  4.  GERD - Continue PPI.   - Carafate .   5.  Depression -Continue amitriptyline  50 mg nightly.  6.  Hyperlipidemia - Statin.  7.  IBS with constipation -Stable. - Continue Movantik , Amitiza .   8.  Obesity -BMI 32.46 kg/m - Lifestyle modification. - Outpatient follow-up with PCP.  9.  History of HSV -Patient requesting her as needed Valtrex . - Place on Valtrex  1000 mg daily.   DVT prophylaxis: Heparin  Code Status: Full Family Communication: Updated patient and daughter at bedside.   Disposition: TBD  Status is: Inpatient Remains inpatient appropriate because: Severity of illness   Consultants:  Orthopedics: Dr. Beuford 08/11/2024  Procedures:  MRI knee 08/13/2024 Chest x-ray 08/11/2024 Plan films of the right knee 08/11/2024 Plain films of the left hip.  Antimicrobials:  Anti-infectives (From admission, onward)    Start     Dose/Rate Route Frequency Ordered Stop   08/14/24 1000  valACYclovir  (VALTREX )  tablet 1,000 mg        1,000 mg Oral Daily 08/13/24 1140     08/13/24 1145  valACYclovir  (VALTREX ) tablet 500 mg  Status:  Discontinued        500 mg Oral Daily 08/13/24 1058 08/13/24  1140   08/13/24 1033  valACYclovir  (VALTREX ) tablet 500 mg        500 mg Oral 2 times daily PRN 08/13/24 1033     08/12/24 1500  vancomycin  (VANCOREADY) IVPB 1500 mg/300 mL        1,500 mg 150 mL/hr over 120 Minutes Intravenous Every 24 hours 08/11/24 1417 08/18/24 1459   08/11/24 2200  metroNIDAZOLE  (FLAGYL ) IVPB 500 mg        500 mg 100 mL/hr over 60 Minutes Intravenous Every 12 hours 08/11/24 1240 08/18/24 2159   08/11/24 1315  vancomycin  (VANCOCIN ) 500 mg in sodium chloride  0.9 % 100 mL IVPB       Placed in Followed by Linked Group   500 mg 100 mL/hr over 60 Minutes Intravenous  Once 08/11/24 1145 08/11/24 1524   08/11/24 1200  ceFEPIme  (MAXIPIME ) 2 g in sodium chloride  0.9 % 100 mL IVPB        2 g 200 mL/hr over 30 Minutes Intravenous Every 8 hours 08/11/24 1145 08/18/24 1159   08/11/24 1145  vancomycin  (VANCOCIN ) IVPB 1000 mg/200 mL premix       Placed in Followed by Linked Group   1,000 mg 200 mL/hr over 60 Minutes Intravenous  Once 08/11/24 1145 08/11/24 1417   08/11/24 1130  metroNIDAZOLE  (FLAGYL ) IVPB 500 mg        500 mg 100 mL/hr over 60 Minutes Intravenous  Once 08/11/24 1119 08/11/24 1246         Subjective: Patient laying in bed, daughter at bedside.  Patient denies any chest pain or shortness of breath.  Patient complaining of left hip pain which she feels may be secondary to her trying to overcompensate for her right knee pain.  Patient with complaints of right knee pain with warmth.  Patient with some complaints of right posterior thigh pain with some tenderness to palpation.  Patient concerned why she is still having fevers.   Patient asking whether another arthrocentesis may be attempted.   Objective: Vitals:   08/12/24 1615 08/12/24 2036 08/12/24 2218 08/13/24 0442  BP: 121/73 107/73  115/77  Pulse: (!) 109 (!) 110  100  Resp:  20  18  Temp: 99.4 F (37.4 C) 100 F (37.8 C) 99.1 F (37.3 C) 98.6 F (37 C)  TempSrc: Oral Oral Oral Oral  SpO2: 94%  92%  99%  Weight:      Height:       No intake or output data in the 24 hours ending 08/13/24 2040  Filed Weights   08/11/24 0939  Weight: 83.1 kg    Examination:  General exam: NAD. Respiratory system: CTAB.  No wheezes, no crackles, no rhonchi.  Fair air movement.  Speaking in full sentences.   Cardiovascular system: Regular rate rhythm no murmurs rubs or gallops.  No JVD.  No lower extremity edema.   Gastrointestinal system: Abdomen is soft, nontender, nondistended, positive bowel sounds.  No rebound.  No guarding.  Central nervous system: Alert and oriented.  Moving EXTR spontaneously.  No focal neurological deficit. Extremities: Right knee with some warmth, TTP in the medial region, some suprapatellar swelling noted.  No significant erythema.  Skin: No rashes, lesions or ulcers Psychiatry: Judgement  and insight appear normal. Mood & affect appropriate.     Data Reviewed: I have personally reviewed following labs and imaging studies  CBC: Recent Labs  Lab 08/11/24 0855 08/12/24 0239 08/13/24 0534  WBC 13.3* 8.1 7.0  NEUTROABS 9.7*  --  4.4  HGB 13.2 11.3* 11.4*  HCT 38.4 33.3* 34.7*  MCV 94.8 96.2 98.6  PLT 349 310 312    Basic Metabolic Panel: Recent Labs  Lab 08/11/24 0855 08/12/24 0239 08/13/24 0534  NA 138 137 139  K 3.7 3.7 3.6  CL 100 105 105  CO2 27 24 23   GLUCOSE 94 132* 90  BUN 9 10 9   CREATININE 0.83 0.88 0.82  CALCIUM  9.9 8.9 9.7    GFR: Estimated Creatinine Clearance: 72.7 mL/min (by C-G formula based on SCr of 0.82 mg/dL).  Liver Function Tests: Recent Labs  Lab 08/11/24 0855  AST 60*  ALT 45*  ALKPHOS 88  BILITOT 0.7  PROT 7.3  ALBUMIN 4.1    CBG: No results for input(s): GLUCAP in the last 168 hours.   Recent Results (from the past 240 hours)  Blood culture (routine x 2)     Status: None (Preliminary result)   Collection Time: 08/11/24  8:55 AM   Specimen: BLOOD LEFT HAND  Result Value Ref Range Status   Specimen  Description   Final    BLOOD LEFT HAND Performed at [facility], [address], [place], [place] [address]    Special Requests   Final    BOTTLES DRAWN AEROBIC AND ANAEROBIC Blood Culture adequate volume Performed at [facility], [address] [place], [place] [address]    Culture   Final    NO GROWTH 2 DAYS Performed at [facility], [address] [place], [place] [address]    Report Status PENDING  Incomplete  Blood culture (routine x 2)     Status: None (Preliminary result)   Collection Time: 08/11/24  8:55 AM   Specimen: BLOOD  Result Value Ref Range Status   Specimen Description   Final    BLOOD RIGHT ANTECUBITAL Performed at [facility], [address] [place], [place] [address]    Special Requests   Final    BOTTLES DRAWN AEROBIC AND ANAEROBIC Blood Culture adequate volume Performed at [facility], [address], [place] , [place] [address]    Culture   Final    NO GROWTH 2 DAYS Performed at [facility], [address] [place], [place] [address]    Report Status PENDING  Incomplete  Resp panel by RT-PCR (RSV, Flu A&B, Covid) Peripheral     Status: None   Collection Time: 08/11/24  8:55 AM   Specimen: Peripheral; Nasal Swab  Result Value Ref Range Status   SARS Coronavirus 2 by RT PCR NEGATIVE NEGATIVE Final    Comment: (NOTE) SARS-CoV-2 target nucleic acids are NOT DETECTED.  The SARS-CoV-2 RNA is generally detectable in upper respiratory specimens during the acute phase of infection. The lowest concentration of SARS-CoV-2 viral copies this assay can detect is 138 copies/mL. A negative result does not preclude SARS-Cov-2 infection and should not be used as the sole basis for treatment or other patient management decisions. A negative result may occur with  improper specimen collection/handling, submission of specimen other than nasopharyngeal swab, presence of  viral mutation(s) within the areas targeted by this assay, and inadequate number of viral copies(<138 copies/mL). A negative result must be combined with  clinical observations, patient history, and epidemiological information. The expected result is Negative.  Fact Sheet for Patients:  bloggercourse.com  Fact Sheet for Healthcare Providers:  seriousbroker.it  This test is no t yet approved or cleared by the [place]  FDA and  has been authorized for detection and/or diagnosis of SARS-CoV-2 by FDA under an Emergency Use Authorization (EUA). This EUA will remain  in effect (meaning this test can be used) for the duration of the COVID-19 declaration under Section 564(b)(1) of the Act, 21 U.S.C.section 360bbb-3(b)(1), unless the authorization is terminated  or revoked sooner.       Influenza A by PCR NEGATIVE NEGATIVE Final   Influenza B by PCR NEGATIVE NEGATIVE Final    Comment: (NOTE) The Xpert Xpress SARS-CoV-2/FLU/RSV plus assay is intended as an aid in the diagnosis of influenza from Nasopharyngeal swab specimens and should not be used as a sole basis for treatment. Nasal washings and aspirates are unacceptable for Xpert Xpress SARS-CoV-2/FLU/RSV testing.  Fact Sheet for Patients: bloggercourse.com  Fact Sheet for Healthcare Providers: seriousbroker.it  This test is not yet approved or cleared by the [place]  FDA and has been authorized for detection and/or diagnosis of SARS-CoV-2 by FDA under an Emergency Use Authorization (EUA). This EUA will remain in effect (meaning this test can be used) for the duration of the COVID-19 declaration under Section 564(b)(1) of the Act, 21 U.S.C. section 360bbb-3(b)(1), unless the authorization is terminated or revoked.     Resp Syncytial Virus by PCR NEGATIVE NEGATIVE Final    Comment: (NOTE) Fact Sheet for  Patients: bloggercourse.com  Fact Sheet for Healthcare Providers: seriousbroker.it  This test is not yet approved or cleared by the [place]  FDA and has been authorized for detection and/or diagnosis of SARS-CoV-2 by FDA under an Emergency Use Authorization (EUA). This EUA will remain in effect (meaning this test can be used) for the duration of the COVID-19 declaration under Section 564(b)(1) of the Act, 21 U.S.C. section 360bbb-3(b)(1), unless the authorization is terminated or revoked.  Performed at [facility], [address] [place], [place] [address]   Urine Culture     Status: Abnormal   Collection Time: 08/11/24 11:58 AM   Specimen: Urine, Clean Catch  Result Value Ref Range Status   Specimen Description   Final    URINE, CLEAN CATCH Performed at [facility], [address] [place], [place] [address]    Special Requests   Final    NONE Performed at [facility], [address] [place], [place] [address]    Culture MULTIPLE SPECIES PRESENT, SUGGEST RECOLLECTION (A)  Final   Report Status 08/12/2024 FINAL  Final         Radiology Studies: DG HIP UNILAT WITH PELVIS 2-3 VIEWS LEFT Result Date: 08/13/2024 EXAM: 2 OR MORE VIEW(S) XRAY OF THE BILATERAL HIP 08/13/2024 01:10:00 PM COMPARISON: None available. CLINICAL HISTORY: Pain. FINDINGS: BONES AND JOINTS: No acute fracture. No malalignment. Mild bilateral hip osteoarthritis. SOFT TISSUES: Neurostimulator with generator in right lower quadrant noted. LUMBAR SPINE: Lower lumbar fusion noted. IMPRESSION: 1. No acute findings. 2. Mild bilateral hip osteoarthritis. Electronically signed by: Greig Pique MD 08/13/2024 08:36 PM EST RP Workstation: [id number]   MR KNEE RIGHT W WO CONTRAST Result Date: 08/13/2024 EXAM: MRI of the right Knee With and Without Contrast. 08/13/2024 09:16:07 AM TECHNIQUE: Multiplanar  multisequence MRI of the right knee was performed without and with intravenous contrast. 8 mL of gadobutrol  (GADAVIST ) 1 MMOL/ML injection was administered.  Metal artifact related to the knee prosthesis obscures surrounding findings. Metal artifact reduction sequences were employed. There is substantial field heterogeneity despite the use of metal artifact reduction sequencing, which lowers diagnostic sensitivity and specificity. COMPARISON: Radiographs 08/11/2024. CLINICAL HISTORY: Chronic right knee pain, total knee prosthesis, clinical concern is raised for septic arthritis. FINDINGS: MEDIAL MENISCUS: Surgically absent status post total knee arthroplasty, with much of the knee joint obscured by metal artifact. LATERAL MENISCUS: Surgically absent status post total knee arthroplasty, with much of the knee joint obscured by metal artifact. ANTERIOR CRUCIATE LIGAMENT: Presumably surgically absent status post total knee arthroplasty, with much of the knee joint obscured by metal artifact. POSTERIOR CRUCIATE LIGAMENT: Presumably surgically absent status post total knee arthroplasty, with much of the knee joint obscured by metal artifact. EXTENSOR MECHANISM: Intact quadriceps and patellar tendons with speckled metal artifact anteriorly. Intact patellar retinacula. LATERAL COLLATERAL LIGAMENT COMPLEX: Intact IT band and biceps femoris tendon. Popliteal region grossly unremarkable. MEDIAL COLLATERAL LIGAMENT COMPLEX: The visualized portion of the medial collateral ligament appears intact. KNEE JOINT: A small knee joint effusion is thought to be present in the suprapatellar bursa with mild marginal synovial enhancement. No specific indicator of infection although arthrocentesis would be warranted if there is a high clinical index of suspicion. Much of the knee joint is obscured by metal artifact. Speckled metal artifact also noted in Hoffa's fat pad without compelling evidence of gas in the joint. BONE MARROW: No acute  fracture or aggressive marrow replacing lesion. No specific or compelling indicators of active osteomyelitis. SOFT TISSUE: No focal abnormality. Mild vastus medialis atrophy. IMPRESSION: 1. No specific MRI indicator of septic arthritis or active osteomyelitis; small suprapatellar joint effusion with mild marginal synovial enhancement, and arthrocentesis is warranted if there is a high clinical index of suspicion. 2. 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 3. Substantial metal artifact and field heterogeneity from the knee prosthesis limit evaluation. Electronically signed by: Ryan Salvage MD 08/13/2024 09:32 AM EST RP Workstation: [id number]        Scheduled Meds:  amitriptyline   50 mg Oral QHS   amLODipine   2.5 mg Oral Daily   atorvastatin   20 mg Oral QHS   celecoxib   200 mg Oral BID   cholecalciferol   1,000 Units Oral QHS   [START ON 08/14/2024] dexlansoprazole   60 mg Oral Daily   fluticasone   2 spray Each Nare Daily   fluticasone  furoate-vilanterol  1 puff Inhalation Daily   Fremanezumab -vfrm  225 mg Subcutaneous Q30 days   gabapentin   900 mg Oral TID   heparin   5,000 Units Subcutaneous Q8H   lubiprostone   24 mcg Oral BID WC   methocarbamol  (ROBAXIN ) injection  500 mg Intravenous Once   montelukast   10 mg Oral QHS   naloxegol  oxalate  25 mg Oral QHS   polyethylene glycol  17 g Oral BID   senna-docusate  1 tablet Oral BID   sucralfate   1 g Oral TID WC & HS   Suzetrigine   50 mg Oral BID   traMADol   100 mg Oral TID   [START ON 08/14/2024] valACYclovir   1,000 mg Oral Daily   Continuous Infusions:  ceFEPime  (MAXIPIME ) IV 2 g (08/13/24 1312)   metronidazole  500 mg (08/13/24 0957)   vancomycin  1,500 mg (08/13/24 1503)     LOS: 2 days    Time spent: 40 minutes    Toribio Hummer, MD [facility]   To contact the attending provider between 7A-7P or the  covering provider during after hours 7P-7A, please log  into the web site [email or web address] and access using universal [place] password for that web site. If you do not have the password, please call the hospital operator.  08/13/2024, 8:40 PM    "

## 2024-08-13 NOTE — Plan of Care (Signed)

## 2024-08-13 NOTE — TOC Initial Note (Signed)
 Transition of Care [facility]) - Initial/Assessment Note    Patient Details  Name: Cassidy Olsen MRN: [id number] Date of Birth: April 06, 1961  [facility]) CM/SW Contact:    Heather DELENA Saltness, LCSW Phone Number: 08/13/2024, 12:03 PM  Clinical Narrative:                 CSW met with pt at bedside to discuss PT's recommendation for OPPT upon discharge. Pt agreeable to OPPT, with preferred office being [facility]. Referral made to PCP, Starleen Monte, by attending provider. TOC will continue to follow.    Expected Discharge Plan: Home/Self Care Barriers to Discharge: Continued Medical Work up   Patient Goals and CMS Choice Patient states their goals for this hospitalization and ongoing recovery are:: To return home   Choice offered to / list presented to : NA [facility] ownership interest in [facility].provided to:: Parent NA    Expected Discharge Plan and Services In-house Referral: NA Discharge Planning Services: NA Post Acute Care Choice: NA Living arrangements for the past 2 months: Single Family Home                 DME Arranged: N/A DME Agency: NA       HH Arranged: NA HH Agency: NA        Prior Living Arrangements/Services Living arrangements for the past 2 months: Single Family Home Lives with:: Self Patient language and need for interpreter reviewed:: Yes Do you feel safe going back to the place where you live?: Yes      Need for Family Participation in Patient Care: No (Comment) Care giver support system in place?: Yes (comment)   Criminal Activity/Legal Involvement Pertinent to Current Situation/Hospitalization: No - Comment as needed  Activities of Daily Living   ADL Screening (condition at time of admission) Independently performs ADLs?: Yes (appropriate for developmental age) Is the patient deaf or have difficulty hearing?: No Does the patient have difficulty seeing, even when wearing glasses/contacts?: Yes Does the patient have  difficulty concentrating, remembering, or making decisions?: No  Permission Sought/Granted Permission sought to share information with : PCP Permission granted to share information with : Yes, Verbal Permission Granted  Share Information with NAME: Starleen Monte  Permission granted to share info w AGENCY: [facility]  Permission granted to share info w Relationship: PCP  Permission granted to share info w Contact Information: [phone]  Emotional Assessment Appearance:: Appears stated age Attitude/Demeanor/Rapport: Engaged Affect (typically observed): Stable, Appropriate Orientation: : Oriented to Self, Oriented to Place, Oriented to  Time, Oriented to Situation Alcohol / Substance Use: Not Applicable Psych Involvement: No (comment)  Admission diagnosis:  SIRS (systemic inflammatory response syndrome) (HCC) [R65.10] Patient Active Problem List   Diagnosis Date Noted   SIRS (systemic inflammatory response syndrome) (HCC) 08/11/2024   Acute pain of right knee 08/11/2024   Chronic constipation 08/06/2024   Generalized abdominal pain 08/06/2024   Tremor 07/08/2024   Sacroiliitis 06/17/2024   Tongue coating 06/10/2024   Oral thrush 06/03/2024   Dyslipidemia 03/28/2024   Arthralgia of right ankle 01/12/2023   Gastroesophageal reflux disease 12/18/2022   Spinal stenosis 12/18/2022   Obesity: Starting BMI 33.3 11/09/2022   Obesity (BMI 30-39.9) 11/09/2022   Dysphagia 09/22/2022   Hypertension, essential 07/26/2022   Class 1 obesity with serious comorbidity and body mass index (BMI) of 33.0 to 33.9 in adult 06/23/2022   Mood disorder (HCC)- with emotional eating 05/23/2022   Mixed hyperlipidemia 05/23/2022   Prediabetes 04/26/2022  Depression 04/26/2022   Allergic rhinitis 01/26/2022   Morbid obesity (HCC) 09/24/2021   Exertional chest pain 09/24/2021   S/P laparoscopic cholecystectomy 12/24/2020   RUQ abdominal pain 12/25/2019   Decreased range of knee movement 09/02/2019    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   Irritable bowel syndrome with constipation 09/02/2019   Calculus, ureter 07/24/2019   Chronic migraine without aura, with intractable migraine, so stated, with status migrainosus 05/30/2019   Chronic asthmatic bronchitis (HCC) 05/14/2019   Excessive daytime sleepiness 01/08/2019   Upper airway cough syndrome 11/12/2018   Acute bacterial sinusitis 07/17/2018   Abnormal chest CT 07/17/2018   Trigeminal neuralgia of right side of face 02/01/2018   Occipital neuralgia 02/01/2018   Chronic migraine without aura without status migrainosus, not intractable 02/01/2018   Decreased strength 05/10/2017   Family history of breast cancer    Family history of prostate cancer    Chronic left shoulder pain 09/02/2015   Carpal tunnel syndrome 08/25/2011   Asthma 04/14/2009   Essential hypertension 04/14/2009   Hyperlipidemia 04/14/2009   DDD (degenerative disc disease), cervical 08/09/1999   PCP:  Perri Starleen BROCKS, MD Pharmacy:   [facility] [phone] - [place], [place] - [address] AT [facility] OF [address] [place] [address] Phone: [phone] Fax: [phone]  [place] [facility] [address] [place] [address] Phone: [phone] Fax: [phone]   Social Drivers of Health (SDOH) Social History: SDOH Screenings   Food Insecurity: No Food Insecurity (08/11/2024)  Housing: Low Risk (08/11/2024)  Transportation Needs: No Transportation Needs (08/11/2024)  Utilities: Not At Risk (08/11/2024)  Alcohol Screen: Low Risk (12/05/2022)  Depression (PHQ2-9): Low Risk (12/05/2022)  Financial Resource Strain: Low Risk (04/12/2023)   Received from [facility]  Physical Activity: Insufficiently Active (04/12/2023)   Received from [facility]  Social Connections: Unknown (08/11/2024)  Stress: No Stress Concern Present (04/12/2023)   Received from [facility]  Tobacco Use: Low Risk  (08/11/2024)   SDOH Interventions: None     Readmission Risk Interventions    08/13/2024   12:01 PM  Readmission Risk Prevention Plan  Post Dischage Appt Complete  Medication Screening Complete  Transportation Screening Complete    Signed: Heather Saltness, MSW, LCSW Clinical Social Worker Inpatient Care Management 08/13/2024 12:05 PM

## 2024-08-13 NOTE — Progress Notes (Signed)
 Mobility Specialist - Progress Note   08/13/24 1420  Mobility  Activity Ambulated with assistance  Level of Assistance Contact guard assist, steadying assist  Assistive Device Front wheel walker  Distance Ambulated (ft) 60 ft  Range of Motion/Exercises Active  Activity Response Tolerated well  Mobility Referral Yes  Mobility visit 1 Mobility  Mobility Specialist Start Time (ACUTE ONLY) 1358  Mobility Specialist Stop Time (ACUTE ONLY) 1418  Mobility Specialist Time Calculation (min) (ACUTE ONLY) 20 min   Received in bed and agreed to mobility, endorsed significant pain throughout RLE.  Initially only wanted to transfer to [facility], upon returning to bed pt felt that she could maybe walk.  Stood and marched in place, pain endorsed but insisted on continuing.  Returned to bed with all needs met.   Cyndee Ada Mobility Specialist

## 2024-08-14 ENCOUNTER — Inpatient Hospital Stay (HOSPITAL_COMMUNITY)

## 2024-08-14 DIAGNOSIS — R6510 Systemic inflammatory response syndrome (SIRS) of non-infectious origin without acute organ dysfunction: Secondary | ICD-10-CM | POA: Diagnosis not present

## 2024-08-14 DIAGNOSIS — M25561 Pain in right knee: Secondary | ICD-10-CM | POA: Diagnosis not present

## 2024-08-14 DIAGNOSIS — M79661 Pain in right lower leg: Secondary | ICD-10-CM | POA: Diagnosis not present

## 2024-08-14 DIAGNOSIS — K581 Irritable bowel syndrome with constipation: Secondary | ICD-10-CM | POA: Diagnosis not present

## 2024-08-14 DIAGNOSIS — G500 Trigeminal neuralgia: Secondary | ICD-10-CM | POA: Diagnosis not present

## 2024-08-14 DIAGNOSIS — K219 Gastro-esophageal reflux disease without esophagitis: Secondary | ICD-10-CM | POA: Diagnosis not present

## 2024-08-14 DIAGNOSIS — E782 Mixed hyperlipidemia: Secondary | ICD-10-CM | POA: Diagnosis not present

## 2024-08-14 DIAGNOSIS — E669 Obesity, unspecified: Secondary | ICD-10-CM | POA: Diagnosis not present

## 2024-08-14 DIAGNOSIS — I10 Essential (primary) hypertension: Secondary | ICD-10-CM | POA: Diagnosis not present

## 2024-08-14 DIAGNOSIS — M5481 Occipital neuralgia: Secondary | ICD-10-CM | POA: Diagnosis not present

## 2024-08-14 LAB — BASIC METABOLIC PANEL WITH GFR
Anion gap: 11 (ref 5–15)
BUN: 7 mg/dL — ABNORMAL LOW (ref 8–23)
CO2: 23 mmol/L (ref 22–32)
Calcium: 9.3 mg/dL (ref 8.9–10.3)
Chloride: 104 mmol/L (ref 98–111)
Creatinine, Ser: 0.81 mg/dL (ref 0.44–1.00)
GFR, Estimated: >60 mL/min
Glucose, Bld: 117 mg/dL — ABNORMAL HIGH (ref 70–99)
Potassium: 3.5 mmol/L (ref 3.5–5.1)
Sodium: 139 mmol/L (ref 135–145)

## 2024-08-14 LAB — CBC
HCT: 32.2 % — ABNORMAL LOW (ref 36.0–46.0)
Hemoglobin: 10.8 g/dL — ABNORMAL LOW (ref 12.0–15.0)
MCH: 32.3 pg (ref 26.0–34.0)
MCHC: 33.5 g/dL (ref 30.0–36.0)
MCV: 96.4 fL (ref 80.0–100.0)
Platelets: 353 K/uL (ref 150–400)
RBC: 3.34 MIL/uL — ABNORMAL LOW (ref 3.87–5.11)
RDW: 13.2 % (ref 11.5–15.5)
WBC: 6.5 K/uL (ref 4.0–10.5)
nRBC: 0 % (ref 0.0–0.2)

## 2024-08-14 MED ORDER — MORPHINE SULFATE (PF) 2 MG/ML IV SOLN
2.0000 mg | INTRAVENOUS | Status: DC | PRN
  Administered 2024-08-14 – 2024-08-16 (×7): 2 mg via INTRAVENOUS
  Filled 2024-08-14 (×7): qty 1

## 2024-08-14 MED ORDER — HYDROMORPHONE HCL 1 MG/ML IJ SOLN
0.5000 mg | INTRAMUSCULAR | Status: DC | PRN
  Administered 2024-08-14: 0.5 mg via INTRAVENOUS
  Filled 2024-08-14: qty 0.5

## 2024-08-14 MED ORDER — ACETAMINOPHEN 325 MG PO TABS: 975.0000 mg | ORAL_TABLET | Freq: Four times a day (QID) | ORAL | Status: DC | PRN

## 2024-08-14 MED ORDER — OXYCODONE HCL 5 MG PO TABS: 10.0000 mg | ORAL_TABLET | Freq: Four times a day (QID) | ORAL | Status: DC | PRN

## 2024-08-14 MED ORDER — ACETAMINOPHEN 650 MG RE SUPP: 975.0000 mg | Freq: Four times a day (QID) | RECTAL | Status: DC | PRN

## 2024-08-14 MED ORDER — METHOCARBAMOL 500 MG PO TABS
500.0000 mg | ORAL_TABLET | Freq: Four times a day (QID) | ORAL | Status: DC | PRN
  Administered 2024-08-14: 500 mg via ORAL
  Filled 2024-08-14: qty 1

## 2024-08-14 MED ORDER — METHOCARBAMOL 1000 MG/10ML IJ SOLN
500.0000 mg | Freq: Three times a day (TID) | INTRAMUSCULAR | Status: DC | PRN
  Administered 2024-08-14 – 2024-08-16 (×6): 500 mg via INTRAVENOUS
  Filled 2024-08-14 (×6): qty 10

## 2024-08-14 NOTE — Plan of Care (Signed)

## 2024-08-14 NOTE — Progress Notes (Signed)
 Bilateral lower extremity venous duplex has been completed. Preliminary results can be found in CV Proc through chart review.   08/14/2024 2:48 PM Cathlyn Collet RVT

## 2024-08-14 NOTE — Progress Notes (Signed)
 " PROGRESS NOTE  Cassidy Olsen FMW:[id number] DOB: Mar 06, 1961   PCP: Perri Starleen BROCKS, MD  Patient is from: Home  DOA: 08/11/2024 LOS: 3  Chief complaints Chief Complaint  Patient presents with   Knee Pain     Brief Narrative / Interim history: 64 year old F with PMH of spinal stenosis, lumbar spine fusion, chronic back pain, right TKA in 2015, migraine, TGN, interstitial cystitis, IBS, anxiety, ADHD, depression and hyperlipidemia presented to the ED with right knee pain.  Reportedly pain started after doing some exercise involving her right knee.  In ED, febrile to 103.1 F.  WBC 13.3 with left shift.  Lactic acid negative. Patient admitted for SIRS with concern for right knee infection.  UA negative.  Cultures obtained.  MRI right knee ordered.  Orthopedics consulted. Patient placed on empiric IV antibiotics and admitted.  Patient was evaluated by orthopedic surgery.  Septic arthritis felt to be highly unlikely and there was no fluid aspirated after 2 attempts.  Blood cultures NGTD.  Leukocytosis resolved.  Continues to endorse severe right leg pain.  MRI lumbar spine and lower extremity venous Doppler ordered.  Subjective: Seen and examined earlier this morning.  No major events overnight or this morning.  Complains of severe right leg pain radiating from her hip to the ankle posteriorly.  Also reports pain around her knee.  She denies numbness or tingling.  Denies new bowel or bladder habit change.   Assessment and plan: Acute right knee pain/right leg pain-initially presented with right knee pain.  She was febrile with mild leukocytosis.  History of right TKA in 2015.  MRI of right knee showed small suprapatellar joint effusion with mild marginal synovial enhancement. Evaluated by orthopedic surgery.  Septic arthritis felt to be less likely.  Blood cultures NGTD.  Spiked another fever to 101.3 last night.  Now with what looks like radiculopathic pain.  She has tenderness out of  proportion.  Neurovascular intact. -Adjust pain meds-increase Tylenol , oxycodone  and morphine .  Add Robaxin . -Continue Celebrex , gabapentin , amitriptyline  and tramadol  -Continue vancomycin  and cefepime  -MRI lumbar spine and lower extremity venous Doppler to rule out DVT -Notified Dr. Beuford about patient's ongoing pain by secure chat -Continue PT/OT.  Essential hypertension: Normotensive. -Continue low-dose amlodipine  -Hold other antihypertensive medications.   Trigeminal neuralgia of the right side of the face with migraines -Continue home amitriptyline , gabapentin  and tramadol    GERD -Continue PPI and Carafate .     Anxiety and depression -Continue amitriptyline  50 mg nightly.   Hyperlipidemia -Continue statin.    IBS with constipation: Stable - Continue Movantik , Amitiza .   History of HSV -Place on Valtrex  1000 mg daily.  Class I obesity Body mass index is 32.46 kg/m.         DVT prophylaxis:  heparin  injection 5,000 Units Start: 08/11/24 2200 Place TED hose Start: 08/11/24 1236  Code Status: Full code Family Communication: Updated patient's daughter over the phone Level of care: Telemetry Status is: Inpatient Remains inpatient appropriate because: Right leg/knee pain   Final disposition: To be determined   55 minutes with more than 50% spent in reviewing records, counseling patient/family and coordinating care.  Consultants:  Orthopedic surgery  Procedures: None  Microbiology summarized: Blood cultures NGTD Urine culture with multiple species COVID-19, influenza and RSV PCR nonreactive  Objective: Vitals:   08/13/24 2040 08/13/24 2233 08/14/24 0652 08/14/24 0942  BP: 121/89 121/79 117/76   Pulse: (!) 118 (!) 103 (!) 102   Resp: 18 18 18    Temp: ROLLEN)  101.3 F (38.5 C) 99 F (37.2 C) 99 F (37.2 C)   TempSrc: Oral Oral Oral   SpO2: 99% 94% 97% 96%  Weight:      Height:        Examination:  GENERAL: No apparent distress.   Nontoxic. HEENT: MMM.  Vision and hearing grossly intact.  NECK: Supple.  No apparent JVD.  RESP:  No IWOB.  Fair aeration bilaterally. CVS:  RRR. Heart sounds normal.  ABD/GI/GU: BS+. Abd soft, NTND.  MSK/EXT:  Moves extremities but limited by pain in right leg.  Notable swelling around right knee.  Tenderness out of proportion. NEURO: AA.  Oriented appropriately.  PSYCH: Calm. Normal affect.   Sch Meds:  Scheduled Meds:  amitriptyline   50 mg Oral QHS   amLODipine   2.5 mg Oral Daily   atorvastatin   20 mg Oral QHS   celecoxib   200 mg Oral BID   cholecalciferol   1,000 Units Oral QHS   dexlansoprazole   60 mg Oral Daily   fluticasone   2 spray Each Nare Daily   fluticasone  furoate-vilanterol  1 puff Inhalation Daily   Fremanezumab -vfrm  225 mg Subcutaneous Q30 days   gabapentin   900 mg Oral TID   heparin   5,000 Units Subcutaneous Q8H   lubiprostone   24 mcg Oral BID WC   montelukast   10 mg Oral QHS   naloxegol  oxalate  25 mg Oral QHS   polyethylene glycol  17 g Oral BID   senna-docusate  1 tablet Oral BID   sucralfate   1 g Oral TID WC & HS   Suzetrigine   50 mg Oral BID   traMADol   100 mg Oral TID   valACYclovir   1,000 mg Oral Daily   Continuous Infusions:  ceFEPime  (MAXIPIME ) IV 2 g (08/14/24 1250)   vancomycin  1,500 mg (08/14/24 1449)   PRN Meds:.acetaminophen  **OR** acetaminophen , albuterol , dicyclomine , hydrALAZINE , methocarbamol  (ROBAXIN ) injection, morphine  injection, ondansetron  **OR** ondansetron  (ZOFRAN ) IV, oxyCODONE , timolol , valACYclovir   Antimicrobials: Anti-infectives (From admission, onward)    Start     Dose/Rate Route Frequency Ordered Stop   08/14/24 1000  valACYclovir  (VALTREX ) tablet 1,000 mg        1,000 mg Oral Daily 08/13/24 1140     08/13/24 1145  valACYclovir  (VALTREX ) tablet 500 mg  Status:  Discontinued        500 mg Oral Daily 08/13/24 1058 08/13/24 1140   08/13/24 1033  valACYclovir  (VALTREX ) tablet 500 mg        500 mg Oral 2 times daily PRN  08/13/24 1033     08/12/24 1500  vancomycin  (VANCOREADY) IVPB 1500 mg/300 mL        1,500 mg 150 mL/hr over 120 Minutes Intravenous Every 24 hours 08/11/24 1417 08/18/24 1459   08/11/24 2200  metroNIDAZOLE  (FLAGYL ) IVPB 500 mg  Status:  Discontinued        500 mg 100 mL/hr over 60 Minutes Intravenous Every 12 hours 08/11/24 1240 08/13/24 2119   08/11/24 1315  vancomycin  (VANCOCIN ) 500 mg in sodium chloride  0.9 % 100 mL IVPB       Placed in Followed by Linked Group   500 mg 100 mL/hr over 60 Minutes Intravenous  Once 08/11/24 1145 08/11/24 1524   08/11/24 1200  ceFEPIme  (MAXIPIME ) 2 g in sodium chloride  0.9 % 100 mL IVPB        2 g 200 mL/hr over 30 Minutes Intravenous Every 8 hours 08/11/24 1145 08/18/24 1159   08/11/24 1145  vancomycin  (VANCOCIN ) IVPB 1000 mg/200 mL  premix       Placed in Followed by Linked Group   1,000 mg 200 mL/hr over 60 Minutes Intravenous  Once 08/11/24 1145 08/11/24 1417   08/11/24 1130  metroNIDAZOLE  (FLAGYL ) IVPB 500 mg        500 mg 100 mL/hr over 60 Minutes Intravenous  Once 08/11/24 1119 08/11/24 1246        I have personally reviewed the following labs and images: CBC: Recent Labs  Lab 08/11/24 0855 08/12/24 0239 08/13/24 0534 08/14/24 0600  WBC 13.3* 8.1 7.0 6.5  NEUTROABS 9.7*  --  4.4  --   HGB 13.2 11.3* 11.4* 10.8*  HCT 38.4 33.3* 34.7* 32.2*  MCV 94.8 96.2 98.6 96.4  PLT 349 310 312 353   BMP &GFR Recent Labs  Lab 08/11/24 0855 08/12/24 0239 08/13/24 0534 08/14/24 0600  NA 138 137 139 139  K 3.7 3.7 3.6 3.5  CL 100 105 105 104  CO2 27 24 23 23   GLUCOSE 94 132* 90 117*  BUN 9 10 9  7*  CREATININE 0.83 0.88 0.82 0.81  CALCIUM  9.9 8.9 9.7 9.3   Estimated Creatinine Clearance: 73.6 mL/min (by C-G formula based on SCr of 0.81 mg/dL). Liver & Pancreas: Recent Labs  Lab 08/11/24 0855  AST 60*  ALT 45*  ALKPHOS 88  BILITOT 0.7  PROT 7.3  ALBUMIN 4.1   No results for input(s): LIPASE, AMYLASE in the last 168  hours. No results for input(s): AMMONIA in the last 168 hours. Diabetic: No results for input(s): HGBA1C in the last 72 hours. No results for input(s): GLUCAP in the last 168 hours. Cardiac Enzymes: No results for input(s): CKTOTAL, CKMB, CKMBINDEX, TROPONINI in the last 168 hours. Recent Labs    07/30/24 1416  PROBNP 6.0   Coagulation Profile: No results for input(s): INR, PROTIME in the last 168 hours. Thyroid  Function Tests: No results for input(s): TSH, T4TOTAL, FREET4, T3FREE, THYROIDAB in the last 72 hours. Lipid Profile: No results for input(s): CHOL, HDL, LDLCALC, TRIG, CHOLHDL, LDLDIRECT in the last 72 hours. Anemia Panel: No results for input(s): VITAMINB12, FOLATE, FERRITIN, TIBC, IRON, RETICCTPCT in the last 72 hours. Urine analysis:    Component Value Date/Time   COLORURINE YELLOW 08/11/2024 1157   APPEARANCEUR CLEAR 08/11/2024 1157   LABSPEC 1.010 08/11/2024 1157   PHURINE 8.0 08/11/2024 1157   GLUCOSEU NEGATIVE 08/11/2024 1157   HGBUR NEGATIVE 08/11/2024 1157   BILIRUBINUR NEGATIVE 08/11/2024 1157   BILIRUBINUR negative 07/22/2019 1416   KETONESUR NEGATIVE 08/11/2024 1157   PROTEINUR NEGATIVE 08/11/2024 1157   UROBILINOGEN 0.2 05/04/2022 2037   NITRITE NEGATIVE 08/11/2024 1157   LEUKOCYTESUR NEGATIVE 08/11/2024 1157   Sepsis Labs: Invalid input(s): PROCALCITONIN, LACTICIDVEN  Microbiology: Recent Results (from the past 240 hours)  Blood culture (routine x 2)     Status: None (Preliminary result)   Collection Time: 08/11/24  8:55 AM   Specimen: BLOOD LEFT HAND  Result Value Ref Range Status   Specimen Description   Final    BLOOD LEFT HAND Performed at [facility], [address], [place], [place] [address]    Special Requests   Final    BOTTLES DRAWN AEROBIC AND ANAEROBIC Blood Culture adequate volume Performed at [facility], [address]  [place], [place] [address]    Culture   Final    NO GROWTH 3 DAYS Performed at [facility], [address], [place], [place] [address]    Report Status PENDING  Incomplete  Blood culture (routine x 2)     Status: None (Preliminary result)   Collection Time: 08/11/24  8:55 AM   Specimen: BLOOD  Result Value Ref Range Status   Specimen Description   Final    BLOOD RIGHT ANTECUBITAL Performed at [facility], [address] [place], [place] [address]    Special Requests   Final    BOTTLES DRAWN AEROBIC AND ANAEROBIC Blood Culture adequate volume Performed at [facility], [address], [place], [place] [address]    Culture   Final    NO GROWTH 3 DAYS Performed at [facility], [address], [place], [place] [address]    Report Status PENDING  Incomplete  Resp panel by RT-PCR (RSV, Flu A&B, Covid) Peripheral     Status: None   Collection Time: 08/11/24  8:55 AM   Specimen: Peripheral; Nasal Swab  Result Value Ref Range Status   SARS Coronavirus 2 by RT PCR NEGATIVE NEGATIVE Final    Comment: (NOTE) SARS-CoV-2 target nucleic acids are NOT DETECTED.  The SARS-CoV-2 RNA is generally detectable in upper respiratory specimens during the acute phase of infection. The lowest concentration of SARS-CoV-2 viral copies this assay can detect is 138 copies/mL. A negative result does not preclude SARS-Cov-2 infection and should not be used as the sole basis for treatment or other patient management decisions. A negative result may occur with  improper specimen collection/handling, submission of specimen other than nasopharyngeal swab, presence of viral mutation(s) within the areas targeted by this assay, and inadequate number of viral copies(<138 copies/mL). A negative result must be combined with clinical observations, patient history, and epidemiological information. The expected result is Negative.  Fact Sheet for Patients:   bloggercourse.com  Fact Sheet for Healthcare Providers:  seriousbroker.it  This test is no t yet approved or cleared by the [place]  FDA and  has been authorized for detection and/or diagnosis of SARS-CoV-2 by FDA under an Emergency Use Authorization (EUA). This EUA will remain  in effect (meaning this test can be used) for the duration of the COVID-19 declaration under Section 564(b)(1) of the Act, 21 U.S.C.section 360bbb-3(b)(1), unless the authorization is terminated  or revoked sooner.       Influenza A by PCR NEGATIVE NEGATIVE Final   Influenza B by PCR NEGATIVE NEGATIVE Final    Comment: (NOTE) The Xpert Xpress SARS-CoV-2/FLU/RSV plus assay is intended as an aid in the diagnosis of influenza from Nasopharyngeal swab specimens and should not be used as a sole basis for treatment. Nasal washings and aspirates are unacceptable for Xpert Xpress SARS-CoV-2/FLU/RSV testing.  Fact Sheet for Patients: bloggercourse.com  Fact Sheet for Healthcare Providers: seriousbroker.it  This test is not yet approved or cleared by the [place]  FDA and has been authorized for detection and/or diagnosis of SARS-CoV-2 by FDA under an Emergency Use Authorization (EUA). This EUA will remain in effect (meaning this test can be used) for the duration of the COVID-19 declaration under Section 564(b)(1) of the Act, 21 U.S.C. section 360bbb-3(b)(1), unless the authorization is terminated or revoked.     Resp Syncytial Virus by PCR NEGATIVE NEGATIVE Final    Comment: (NOTE) Fact Sheet for Patients: bloggercourse.com  Fact Sheet for Healthcare Providers: seriousbroker.it  This test is not yet approved or cleared by the [place]  FDA and has been authorized for detection and/or diagnosis of SARS-CoV-2 by FDA under an Emergency Use  Authorization (EUA). This EUA will remain in effect (meaning  this test can be used) for the duration of the COVID-19 declaration under Section 564(b)(1) of the Act, 21 U.S.C. section 360bbb-3(b)(1), unless the authorization is terminated or revoked.  Performed at [facility], [address] [place], [place] [address]   Urine Culture     Status: Abnormal   Collection Time: 08/11/24 11:58 AM   Specimen: Urine, Clean Catch  Result Value Ref Range Status   Specimen Description   Final    URINE, CLEAN CATCH Performed at [facility], [address] [place], [place] [address]    Special Requests   Final    NONE Performed at [facility], [address], [place], [place] [address]    Culture MULTIPLE SPECIES PRESENT, SUGGEST RECOLLECTION (A)  Final   Report Status 08/12/2024 FINAL  Final    Radiology Studies: No results found.    Yarianna Varble T. [facility]  If 7PM-7AM, please contact night-coverage [email or web address] 08/14/2024, 2:55 PM   "

## 2024-08-14 NOTE — Progress Notes (Signed)
 Physical Therapy Treatment Patient Details Name: Cassidy Olsen MRN: [id number] DOB: 1961-04-22 Today's Date: 08/14/2024   History of Present Illness 64 year old female presenting to the ED with a 2-day history of worsening right knee pain, warmth, erythema, subjective fevers, inability to ambulate on right lower extremity x 1 day. history of migraine headaches, trigeminal and occipital neuralgia, interstitial cystitis, hypertension, hyperlipidemia, IBS, ADHD, history of right knee replacement 2015.    PT Comments  Pt is progressing toward acute PT goals this session with progression of ambulation distance and decreased assist for mobility this date. Pt ambulated ~35ft with close supervision, no overt LOB observed. Further distance limited by increased pain levels in R LE and pt request return to room. Pt will benefit from continued skilled PT to increase their independence and maximize safety with mobility.      If plan is discharge home, recommend the following: Help with stairs or ramp for entrance   Can travel by private vehicle        Equipment Recommendations  Rolling walker (2 wheels)    Recommendations for Other Services       Precautions / Restrictions Precautions Precautions: Fall Recall of Precautions/Restrictions: Intact Precaution/Restrictions Comments: reports 1 fall in past 6 months (occurred while getting up during the night) Restrictions Weight Bearing Restrictions Per Provider Order: No     Mobility  Bed Mobility Overal bed mobility: Modified Independent             General bed mobility comments: increased time due to pain, pt reports meds she received earlier helped with pain levels    Transfers Overall transfer level: Modified independent Equipment used: Rolling walker (2 wheels) Transfers: Sit to/from Stand Sit to Stand: Supervision           General transfer comment: x2; increased time due to pain. Supervision  provided for safety due to  increased pain levels, but no physical asisst required.    Ambulation/Gait Ambulation/Gait assistance: Supervision Gait Distance (Feet): 60 Feet Assistive device: Rolling walker (2 wheels) Gait Pattern/deviations: Step-to pattern, Decreased step length - right, Decreased step length - left, Decreased weight shift to right, Knee flexed in stance - right, Antalgic Gait velocity: decr     General Gait Details: ambulated to bathroom in room and then additional 71ft following toileting. Increased time due to pain, pt reports slight improvement in pain in R hamstring, but increased discomfort in R knee with increased distance.   Stairs             Wheelchair Mobility     Tilt Bed    Modified Rankin (Stroke Patients Only)       Balance                                            Communication Communication Communication: No apparent difficulties  Cognition Arousal: Alert Behavior During Therapy: WFL for tasks assessed/performed   PT - Cognitive impairments: No apparent impairments                         Following commands: Intact      Cueing    Exercises      General Comments General comments (skin integrity, edema, etc.): Pt reports no position is comfortable for extended periods of time. Performed knee to chest x2 on R LE - pt reports some improvement- encouraged to  perform 2-5 reps at a time if tolerated. Use of AAROM by self, daughter (who was present during session) or wiht use of gait belt to assist as pt reports she does with in/out of bed when needed. educated on use of log roll technique for comfort with bed mobility.      Pertinent Vitals/Pain Pain Assessment Pain Assessment: 0-10 Pain Score: 10-Worst pain ever Pain Location: R knee and R hamstring radiating down R LE during movement and weight bearing (pain was 8 increased to 10/10 after mobility per pt report) Pain Descriptors / Indicators: Burning, Heaviness, Tender,  Tightness, Shooting, Radiating Pain Intervention(s): Limited activity within patient's tolerance, Monitored during session, Repositioned, Other (comment), Ice applied (RN present to adminster meds at end of session)    Home Living                          Prior Function            PT Goals (current goals can now be found in the care plan section) Acute Rehab PT Goals 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 PT Goal Formulation: With patient Time For Goal Achievement: 08/26/24 Potential to Achieve Goals: Good Progress towards PT goals: Progressing toward goals    Frequency    Min 3X/week      PT Plan      Co-evaluation              AM-PAC PT 6 Clicks Mobility   Outcome Measure  Help needed turning from your back to your side while in a flat bed without using bedrails?: None Help needed moving from lying on your back to sitting on the side of a flat bed without using bedrails?: None Help needed moving to and from a bed to a chair (including a wheelchair)?: A Little Help needed standing up from a chair using your arms (e.g., wheelchair or bedside chair)?: A Little Help needed to walk in hospital room?: A Little Help needed climbing 3-5 steps with a railing? : A Lot 6 Click Score: 19    End of Session   Activity Tolerance: Patient tolerated treatment well;Patient limited by pain Patient left: in bed;with call bell/phone within reach;with nursing/sitter in room;with family/visitor present Nurse Communication: Mobility status PT Visit Diagnosis: Difficulty in walking, not elsewhere classified (R26.2);Pain Pain - Right/Left: Right Pain - part of body: Leg;Knee     Time: 8795-8756 PT Time Calculation (min) (ACUTE ONLY): 39 min  Charges:    $Therapeutic Activity: 38-52 mins PT General Charges $$ ACUTE PT VISIT: 1 Visit                     Tinnie BERRY PT, DPT  Acute Rehabilitation Services  Office [phone]  08/14/2024,  4:58 PM

## 2024-08-15 ENCOUNTER — Inpatient Hospital Stay (HOSPITAL_COMMUNITY)

## 2024-08-15 DIAGNOSIS — G500 Trigeminal neuralgia: Secondary | ICD-10-CM | POA: Diagnosis not present

## 2024-08-15 DIAGNOSIS — E669 Obesity, unspecified: Secondary | ICD-10-CM | POA: Diagnosis not present

## 2024-08-15 DIAGNOSIS — E782 Mixed hyperlipidemia: Secondary | ICD-10-CM | POA: Diagnosis not present

## 2024-08-15 DIAGNOSIS — I10 Essential (primary) hypertension: Secondary | ICD-10-CM | POA: Diagnosis not present

## 2024-08-15 DIAGNOSIS — R6510 Systemic inflammatory response syndrome (SIRS) of non-infectious origin without acute organ dysfunction: Secondary | ICD-10-CM | POA: Diagnosis not present

## 2024-08-15 DIAGNOSIS — K219 Gastro-esophageal reflux disease without esophagitis: Secondary | ICD-10-CM | POA: Diagnosis not present

## 2024-08-15 DIAGNOSIS — R7401 Elevation of levels of liver transaminase levels: Secondary | ICD-10-CM

## 2024-08-15 DIAGNOSIS — M25561 Pain in right knee: Secondary | ICD-10-CM | POA: Diagnosis not present

## 2024-08-15 DIAGNOSIS — M25461 Effusion, right knee: Secondary | ICD-10-CM | POA: Diagnosis not present

## 2024-08-15 DIAGNOSIS — R509 Fever, unspecified: Secondary | ICD-10-CM | POA: Diagnosis not present

## 2024-08-15 DIAGNOSIS — M5416 Radiculopathy, lumbar region: Secondary | ICD-10-CM | POA: Diagnosis not present

## 2024-08-15 DIAGNOSIS — K581 Irritable bowel syndrome with constipation: Secondary | ICD-10-CM | POA: Diagnosis not present

## 2024-08-15 DIAGNOSIS — M5481 Occipital neuralgia: Secondary | ICD-10-CM | POA: Diagnosis not present

## 2024-08-15 LAB — COMPREHENSIVE METABOLIC PANEL WITH GFR
ALT: 75 U/L — ABNORMAL HIGH (ref 0–44)
AST: 32 U/L (ref 15–41)
Albumin: 3.3 g/dL — ABNORMAL LOW (ref 3.5–5.0)
Alkaline Phosphatase: 108 U/L (ref 38–126)
Anion gap: 9 (ref 5–15)
BUN: 8 mg/dL (ref 8–23)
CO2: 25 mmol/L (ref 22–32)
Calcium: 9.3 mg/dL (ref 8.9–10.3)
Chloride: 107 mmol/L (ref 98–111)
Creatinine, Ser: 0.76 mg/dL (ref 0.44–1.00)
GFR, Estimated: >60 mL/min
Glucose, Bld: 106 mg/dL — ABNORMAL HIGH (ref 70–99)
Potassium: 4 mmol/L (ref 3.5–5.1)
Sodium: 141 mmol/L (ref 135–145)
Total Bilirubin: 0.3 mg/dL (ref 0.0–1.2)
Total Protein: 6.2 g/dL — ABNORMAL LOW (ref 6.5–8.1)

## 2024-08-15 LAB — CBC
HCT: 31.6 % — ABNORMAL LOW (ref 36.0–46.0)
Hemoglobin: 10.3 g/dL — ABNORMAL LOW (ref 12.0–15.0)
MCH: 32 pg (ref 26.0–34.0)
MCHC: 32.6 g/dL (ref 30.0–36.0)
MCV: 98.1 fL (ref 80.0–100.0)
Platelets: 374 K/uL (ref 150–400)
RBC: 3.22 MIL/uL — ABNORMAL LOW (ref 3.87–5.11)
RDW: 13.3 % (ref 11.5–15.5)
WBC: 5.8 K/uL (ref 4.0–10.5)
nRBC: 0 % (ref 0.0–0.2)

## 2024-08-15 LAB — MAGNESIUM: Magnesium: 2 mg/dL (ref 1.7–2.4)

## 2024-08-15 LAB — C-REACTIVE PROTEIN: CRP: 12 mg/dL — ABNORMAL HIGH

## 2024-08-15 LAB — CK: Total CK: 141 U/L (ref 38–234)

## 2024-08-15 MED ORDER — DOCUSATE SODIUM 100 MG PO CAPS
100.0000 mg | ORAL_CAPSULE | Freq: Every day | ORAL | Status: DC | PRN
  Administered 2024-08-15: 100 mg via ORAL
  Filled 2024-08-15: qty 1

## 2024-08-15 MED ORDER — SODIUM CHLORIDE 0.9 % IV SOLN
2.0000 g | INTRAVENOUS | Status: DC
  Administered 2024-08-15: 2 g via INTRAVENOUS
  Filled 2024-08-15: qty 20

## 2024-08-15 MED ORDER — DICYCLOMINE HCL 10 MG PO CAPS: 20.0000 mg | ORAL_CAPSULE | Freq: Two times a day (BID) | ORAL | Status: DC | PRN

## 2024-08-15 MED ORDER — POLYETHYLENE GLYCOL 3350 17 G PO PACK
34.0000 g | PACK | Freq: Two times a day (BID) | ORAL | Status: DC
  Administered 2024-08-15 – 2024-08-16 (×3): 34 g via ORAL
  Filled 2024-08-15 (×3): qty 2

## 2024-08-15 MED ORDER — SENNOSIDES-DOCUSATE SODIUM 8.6-50 MG PO TABS
2.0000 | ORAL_TABLET | Freq: Two times a day (BID) | ORAL | Status: DC
  Administered 2024-08-15 – 2024-08-16 (×2): 2 via ORAL
  Filled 2024-08-15 (×2): qty 2

## 2024-08-15 MED ORDER — GADOBUTROL 1 MMOL/ML IV SOLN
8.0000 mL | Freq: Once | INTRAVENOUS | Status: AC | PRN
  Administered 2024-08-15: 8 mL via INTRAVENOUS

## 2024-08-15 NOTE — Progress Notes (Signed)
 Mobility Specialist Progress Note:   08/15/24 1045  Mobility  Activity Ambulated with assistance  Level of Assistance Modified independent, requires aide device or extra time  Assistive Device Front wheel walker  Distance Ambulated (ft) 100 ft  Activity Response Tolerated fair  Mobility Referral Yes  Mobility visit 1 Mobility  Mobility Specialist Start Time (ACUTE ONLY) 1005  Mobility Specialist Stop Time (ACUTE ONLY) 1019  Mobility Specialist Time Calculation (min) (ACUTE ONLY) 14 min   Pt was received in recliner and agreed to mobility. Pt stated pain in lower extremity but continued ambulation. Returned to recliner with all needs met. Left in room with doctor.  [employer] - Mobility Specialist

## 2024-08-15 NOTE — Progress Notes (Signed)
 " PROGRESS NOTE  Cassidy Olsen FMW:[id number] DOB: 1960-12-17   PCP: Perri Starleen BROCKS, MD  Patient is from: Home  DOA: 08/11/2024 LOS: 4  Chief complaints Chief Complaint  Patient presents with   Knee Pain     Brief Narrative / Interim history: 64 year old F with PMH of spinal stenosis, lumbar spine fusion, chronic back pain, right TKA in 2015, migraine, TGN, interstitial cystitis, IBS, anxiety, ADHD, depression and hyperlipidemia presented to the ED with right knee pain.  Reportedly pain started after doing some exercise involving her right knee.  In ED, febrile to 103.1 F.  WBC 13.3 with left shift.  Lactic acid negative. Patient admitted for SIRS with concern for right knee infection.  UA negative.  Cultures obtained.  MRI right knee ordered.  Orthopedics consulted. Patient placed on empiric IV antibiotics and admitted.  Patient was evaluated by orthopedic surgery.  Septic arthritis felt to be highly unlikely and there was no fluid aspirated after 2 attempts.  Blood cultures NGTD.  Leukocytosis resolved.  Continues to endorse severe right leg pain.  MRI lumbar spine and lower extremity venous Doppler ordered.  Orthopedic surgery reengaged.  ID consulted as well.  Subjective: Seen and examined earlier this morning.  No major events overnight or this morning.  Reports improvement in her posterior right leg pain.  She says her knee still hurts but less swollen.  Has not a bowel movement in 3 days.  Assessment and plan: Acute right knee pain/right leg pain-initially presented with right knee pain.  She was febrile with mild leukocytosis.  History of right TKA in 2015.  Initially concerned about septic arthritis but no fluid returned with EDP attempted to tap twice. MRI of right knee showed small suprapatellar joint effusion with mild marginal synovial enhancement. Evaluated by orthopedic surgery twice.  Septic arthritis felt to be less likely.  Blood cultures NGTD.  Lower extremity venous  Doppler negative for DVT.  Last fever 101.3 on 1/6.  She also have radiculopathic pain in right leg.  Overall, pain improved after adjusting pain medications. -Continue Tylenol , Celebrex , gabapentin , tramadol , oxycodone , Robaxin  and morphine   -Continue vancomycin  and cefepime  -Follow MRI lumbar spine -ID consulted. -Continue PT/OT.  SIRS: Significant fever to 103.1 with mild leukocytosis on presentation.  Still with no clear source of infection - Antibiotics as above. - Follow MRI lumbar spine - ID consulted.  Essential hypertension: Normotensive. -Continue low-dose amlodipine  - Continue holding lisinopril  and Lasix .   Trigeminal neuralgia of the right side of the face with migraines -Continue home amitriptyline , gabapentin  and tramadol    GERD -Continue PPI and Carafate .     Anxiety and depression -Continue amitriptyline  50 mg nightly.   Hyperlipidemia -Continue statin.    IBS with constipation: Stable - Continue Movantik , Amitiza .   History of HSV -Place on Valtrex  1000 mg daily.  Class I obesity Body mass index is 32.46 kg/m.         DVT prophylaxis:  heparin  injection 5,000 Units Start: 08/11/24 2200 Place TED hose Start: 08/11/24 1236  Code Status: Full code Family Communication: None at bedside. Level of care: Telemetry Status is: Inpatient Remains inpatient appropriate because: Right leg/knee pain   Final disposition: Home once medically cleared.   55 minutes with more than 50% spent in reviewing records, counseling patient/family and coordinating care.  Consultants:  Orthopedic surgery Infectious disease  Procedures: 1/4-unsuccessful right arthrocentesis x 2 in ED.  Microbiology summarized: Blood cultures NGTD Urine culture with multiple species COVID-19, influenza and RSV PCR  nonreactive  Objective: Vitals:   08/14/24 1613 08/14/24 2113 08/15/24 0510 08/15/24 0904  BP: 119/84 125/82 106/83 127/88  Pulse: (!) 103 96 93 (!) 103  Resp: 18   18 18   Temp: 98.9 F (37.2 C) 98.9 F (37.2 C) 98.5 F (36.9 C) 98.5 F (36.9 C)  TempSrc:  Oral Oral Oral  SpO2: 94% 98% 97% 100%  Weight:      Height:        Examination:  GENERAL: No apparent distress.  Nontoxic. HEENT: MMM.  Vision and hearing grossly intact.  NECK: Supple.  No apparent JVD.  RESP:  No IWOB.  Fair aeration bilaterally. CVS:  RRR. Heart sounds normal.  ABD/GI/GU: BS+. Abd soft, NTND.  MSK/EXT:  Moves extremities but limited by pain in right leg/knee.  Relatively increased sensation in right leg.  No erythema or swelling. NEURO: AA.  Oriented appropriately.  PSYCH: Calm. Normal affect.   Sch Meds:  Scheduled Meds:  amitriptyline   50 mg Oral QHS   amLODipine   2.5 mg Oral Daily   atorvastatin   20 mg Oral QHS   celecoxib   200 mg Oral BID   cholecalciferol   1,000 Units Oral QHS   dexlansoprazole   60 mg Oral Daily   fluticasone   2 spray Each Nare Daily   fluticasone  furoate-vilanterol  1 puff Inhalation Daily   Fremanezumab -vfrm  225 mg Subcutaneous Q30 days   gabapentin   900 mg Oral TID   heparin   5,000 Units Subcutaneous Q8H   lubiprostone   24 mcg Oral BID WC   montelukast   10 mg Oral QHS   polyethylene glycol  34 g Oral BID   senna-docusate  2 tablet Oral BID   sucralfate   1 g Oral TID WC & HS   Suzetrigine   50 mg Oral BID   traMADol   100 mg Oral TID   valACYclovir   1,000 mg Oral Daily   Continuous Infusions:  ceFEPime  (MAXIPIME ) IV 2 g (08/15/24 1402)   vancomycin  1,500 mg (08/14/24 1449)   PRN Meds:.acetaminophen  **OR** acetaminophen , albuterol , hydrALAZINE , methocarbamol  (ROBAXIN ) injection, morphine  injection, ondansetron  **OR** ondansetron  (ZOFRAN ) IV, oxyCODONE , timolol , valACYclovir   Antimicrobials: Anti-infectives (From admission, onward)    Start     Dose/Rate Route Frequency Ordered Stop   08/14/24 1000  valACYclovir  (VALTREX ) tablet 1,000 mg        1,000 mg Oral Daily 08/13/24 1140     08/13/24 1145  valACYclovir  (VALTREX ) tablet  500 mg  Status:  Discontinued        500 mg Oral Daily 08/13/24 1058 08/13/24 1140   08/13/24 1033  valACYclovir  (VALTREX ) tablet 500 mg        500 mg Oral 2 times daily PRN 08/13/24 1033     08/12/24 1500  vancomycin  (VANCOREADY) IVPB 1500 mg/300 mL        1,500 mg 150 mL/hr over 120 Minutes Intravenous Every 24 hours 08/11/24 1417 08/18/24 1459   08/11/24 2200  metroNIDAZOLE  (FLAGYL ) IVPB 500 mg  Status:  Discontinued        500 mg 100 mL/hr over 60 Minutes Intravenous Every 12 hours 08/11/24 1240 08/13/24 2119   08/11/24 1315  vancomycin  (VANCOCIN ) 500 mg in sodium chloride  0.9 % 100 mL IVPB       Placed in Followed by Linked Group   500 mg 100 mL/hr over 60 Minutes Intravenous  Once 08/11/24 1145 08/11/24 1524   08/11/24 1200  ceFEPIme  (MAXIPIME ) 2 g in sodium chloride  0.9 % 100 mL IVPB  2 g 200 mL/hr over 30 Minutes Intravenous Every 8 hours 08/11/24 1145 08/18/24 1159   08/11/24 1145  vancomycin  (VANCOCIN ) IVPB 1000 mg/200 mL premix       Placed in Followed by Linked Group   1,000 mg 200 mL/hr over 60 Minutes Intravenous  Once 08/11/24 1145 08/11/24 1417   08/11/24 1130  metroNIDAZOLE  (FLAGYL ) IVPB 500 mg        500 mg 100 mL/hr over 60 Minutes Intravenous  Once 08/11/24 1119 08/11/24 1246        I have personally reviewed the following labs and images: CBC: Recent Labs  Lab 08/11/24 0855 08/12/24 0239 08/13/24 0534 08/14/24 0600 08/15/24 0603  WBC 13.3* 8.1 7.0 6.5 5.8  NEUTROABS 9.7*  --  4.4  --   --   HGB 13.2 11.3* 11.4* 10.8* 10.3*  HCT 38.4 33.3* 34.7* 32.2* 31.6*  MCV 94.8 96.2 98.6 96.4 98.1  PLT 349 310 312 353 374   BMP &GFR Recent Labs  Lab 08/11/24 0855 08/12/24 0239 08/13/24 0534 08/14/24 0600 08/15/24 0603  NA 138 137 139 139 141  K 3.7 3.7 3.6 3.5 4.0  CL 100 105 105 104 107  CO2 27 24 23 23 25   GLUCOSE 94 132* 90 117* 106*  BUN 9 10 9  7* 8  CREATININE 0.83 0.88 0.82 0.81 0.76  CALCIUM  9.9 8.9 9.7 9.3 9.3  MG  --   --   --    --  2.0   Estimated Creatinine Clearance: 74.5 mL/min (by C-G formula based on SCr of 0.76 mg/dL). Liver & Pancreas: Recent Labs  Lab 08/11/24 0855 08/15/24 0603  AST 60* 32  ALT 45* 75*  ALKPHOS 88 108  BILITOT 0.7 0.3  PROT 7.3 6.2*  ALBUMIN 4.1 3.3*   No results for input(s): LIPASE, AMYLASE in the last 168 hours. No results for input(s): AMMONIA in the last 168 hours. Diabetic: No results for input(s): HGBA1C in the last 72 hours. No results for input(s): GLUCAP in the last 168 hours. Cardiac Enzymes: Recent Labs  Lab 08/15/24 0603  CKTOTAL 141   Recent Labs    07/30/24 1416  PROBNP 6.0   Coagulation Profile: No results for input(s): INR, PROTIME in the last 168 hours. Thyroid  Function Tests: No results for input(s): TSH, T4TOTAL, FREET4, T3FREE, THYROIDAB in the last 72 hours. Lipid Profile: No results for input(s): CHOL, HDL, LDLCALC, TRIG, CHOLHDL, LDLDIRECT in the last 72 hours. Anemia Panel: No results for input(s): VITAMINB12, FOLATE, FERRITIN, TIBC, IRON, RETICCTPCT in the last 72 hours. Urine analysis:    Component Value Date/Time   COLORURINE YELLOW 08/11/2024 1157   APPEARANCEUR CLEAR 08/11/2024 1157   LABSPEC 1.010 08/11/2024 1157   PHURINE 8.0 08/11/2024 1157   GLUCOSEU NEGATIVE 08/11/2024 1157   HGBUR NEGATIVE 08/11/2024 1157   BILIRUBINUR NEGATIVE 08/11/2024 1157   BILIRUBINUR negative 07/22/2019 1416   KETONESUR NEGATIVE 08/11/2024 1157   PROTEINUR NEGATIVE 08/11/2024 1157   UROBILINOGEN 0.2 05/04/2022 2037   NITRITE NEGATIVE 08/11/2024 1157   LEUKOCYTESUR NEGATIVE 08/11/2024 1157   Sepsis Labs: Invalid input(s): PROCALCITONIN, LACTICIDVEN  Microbiology: Recent Results (from the past 240 hours)  Blood culture (routine x 2)     Status: None (Preliminary result)   Collection Time: 08/11/24  8:55 AM   Specimen: BLOOD LEFT HAND  Result Value Ref Range Status   Specimen Description    Final    BLOOD LEFT HAND Performed at [facility], [address], [place],  [place] [address]    Special Requests   Final    BOTTLES DRAWN AEROBIC AND ANAEROBIC Blood Culture adequate volume Performed at [facility], [address] [place], [place] [address]    Culture   Final    NO GROWTH 4 DAYS Performed at [facility], [address] [place], [place] [address]    Report Status PENDING  Incomplete  Blood culture (routine x 2)     Status: None (Preliminary result)   Collection Time: 08/11/24  8:55 AM   Specimen: BLOOD  Result Value Ref Range Status   Specimen Description   Final    BLOOD RIGHT ANTECUBITAL Performed at [facility], [address] [place], [place] [address]    Special Requests   Final    BOTTLES DRAWN AEROBIC AND ANAEROBIC Blood Culture adequate volume Performed at [facility], [address] [place], [place] [address]    Culture   Final    NO GROWTH 4 DAYS Performed at [facility], [address], [place], [place] [address]    Report Status PENDING  Incomplete  Resp panel by RT-PCR (RSV, Flu A&B, Covid) Peripheral     Status: None   Collection Time: 08/11/24  8:55 AM   Specimen: Peripheral; Nasal Swab  Result Value Ref Range Status   SARS Coronavirus 2 by RT PCR NEGATIVE NEGATIVE Final    Comment: (NOTE) SARS-CoV-2 target nucleic acids are NOT DETECTED.  The SARS-CoV-2 RNA is generally detectable in upper respiratory specimens during the acute phase of infection. The lowest concentration of SARS-CoV-2 viral copies this assay can detect is 138 copies/mL. A negative result does not preclude SARS-Cov-2 infection and should not be used as the sole basis for treatment or other patient management decisions. A negative result may occur with  improper specimen collection/handling, submission of specimen other than nasopharyngeal swab, presence of viral  mutation(s) within the areas targeted by this assay, and inadequate number of viral copies(<138 copies/mL). A negative result must be combined with clinical observations, patient history, and epidemiological information. The expected result is Negative.  Fact Sheet for Patients:  bloggercourse.com  Fact Sheet for Healthcare Providers:  seriousbroker.it  This test is no t yet approved or cleared by the [place]  FDA and  has been authorized for detection and/or diagnosis of SARS-CoV-2 by FDA under an Emergency Use Authorization (EUA). This EUA will remain  in effect (meaning this test can be used) for the duration of the COVID-19 declaration under Section 564(b)(1) of the Act, 21 U.S.C.section 360bbb-3(b)(1), unless the authorization is terminated  or revoked sooner.       Influenza A by PCR NEGATIVE NEGATIVE Final   Influenza B by PCR NEGATIVE NEGATIVE Final    Comment: (NOTE) The Xpert Xpress SARS-CoV-2/FLU/RSV plus assay is intended as an aid in the diagnosis of influenza from Nasopharyngeal swab specimens and should not be used as a sole basis for treatment. Nasal washings and aspirates are unacceptable for Xpert Xpress SARS-CoV-2/FLU/RSV testing.  Fact Sheet for Patients: bloggercourse.com  Fact Sheet for Healthcare Providers: seriousbroker.it  This test is not yet approved or cleared by the [place]  FDA and has been authorized for detection and/or diagnosis of SARS-CoV-2 by FDA under an Emergency Use Authorization (EUA). This EUA will remain in effect (meaning this test can be used) for the duration of the COVID-19 declaration under Section 564(b)(1) of the Act, 21 U.S.C. section 360bbb-3(b)(1), unless the authorization is terminated or revoked.  Resp Syncytial Virus by PCR NEGATIVE NEGATIVE Final    Comment: (NOTE) Fact Sheet for  Patients: bloggercourse.com  Fact Sheet for Healthcare Providers: seriousbroker.it  This test is not yet approved or cleared by the [place]  FDA and has been authorized for detection and/or diagnosis of SARS-CoV-2 by FDA under an Emergency Use Authorization (EUA). This EUA will remain in effect (meaning this test can be used) for the duration of the COVID-19 declaration under Section 564(b)(1) of the Act, 21 U.S.C. section 360bbb-3(b)(1), unless the authorization is terminated or revoked.  Performed at [facility], [address] [place], [place] [address]   Urine Culture     Status: Abnormal   Collection Time: 08/11/24 11:58 AM   Specimen: Urine, Clean Catch  Result Value Ref Range Status   Specimen Description   Final    URINE, CLEAN CATCH Performed at [facility], [address] [place], [place] [address]    Special Requests   Final    NONE Performed at [facility], [address], [place], [place] [address]    Culture MULTIPLE SPECIES PRESENT, SUGGEST RECOLLECTION (A)  Final   Report Status 08/12/2024 FINAL  Final    Radiology Studies: VAS US  LOWER EXTREMITY VENOUS (DVT) Result Date: 08/14/2024  Lower Venous DVT Study Patient Name:  DHANI IMEL  Date of Exam:   08/14/2024 Medical Rec #: [id number]        Accession #:    [id number] Date of Birth: 1960/10/13        Patient Gender: F Patient Age:   60 years Exam Location:  [facility] Procedure:      VAS US  LOWER EXTREMITY VENOUS (DVT) Referring Phys: MIGNON Alinda Egolf --------------------------------------------------------------------------------  Indications: Pain.  Risk Factors: None identified. Comparison Study: No prior studies. Performing Technologist: Cordella Collet RVT  Examination Guidelines: A complete evaluation includes B-mode imaging, spectral Doppler, color Doppler, and power Doppler as needed of  all accessible portions of each vessel. Bilateral testing is considered an integral part of a complete examination. Limited examinations for reoccurring indications may be performed as noted. The reflux portion of the exam is performed with the patient in reverse Trendelenburg.  +---------+---------------+---------+-----------+----------+--------------+ RIGHT    CompressibilityPhasicitySpontaneityPropertiesThrombus Aging +---------+---------------+---------+-----------+----------+--------------+ CFV      Full           Yes      Yes                                 +---------+---------------+---------+-----------+----------+--------------+ SFJ      Full                                                        +---------+---------------+---------+-----------+----------+--------------+ FV Prox  Full                                                        +---------+---------------+---------+-----------+----------+--------------+ FV Mid   Full                                                        +---------+---------------+---------+-----------+----------+--------------+  FV DistalFull                                                        +---------+---------------+---------+-----------+----------+--------------+ PFV      Full                                                        +---------+---------------+---------+-----------+----------+--------------+ POP      Full           Yes      Yes                                 +---------+---------------+---------+-----------+----------+--------------+ PTV      Full                                                        +---------+---------------+---------+-----------+----------+--------------+ PERO     Full                                                        +---------+---------------+---------+-----------+----------+--------------+    +---------+---------------+---------+-----------+----------+--------------+ LEFT     CompressibilityPhasicitySpontaneityPropertiesThrombus Aging +---------+---------------+---------+-----------+----------+--------------+ CFV      Full           Yes      Yes                                 +---------+---------------+---------+-----------+----------+--------------+ SFJ      Full                                                        +---------+---------------+---------+-----------+----------+--------------+ FV Prox  Full                                                        +---------+---------------+---------+-----------+----------+--------------+ FV Mid   Full                                                        +---------+---------------+---------+-----------+----------+--------------+ FV DistalFull                                                        +---------+---------------+---------+-----------+----------+--------------+  PFV      Full                                                        +---------+---------------+---------+-----------+----------+--------------+ POP      Full           Yes      Yes                                 +---------+---------------+---------+-----------+----------+--------------+ PTV      Full                                                        +---------+---------------+---------+-----------+----------+--------------+ PERO     Full                                                        +---------+---------------+---------+-----------+----------+--------------+     Summary: RIGHT: - There is no evidence of deep vein thrombosis in the lower extremity.  - No cystic structure found in the popliteal fossa.  LEFT: - There is no evidence of deep vein thrombosis in the lower extremity.  - No cystic structure found in the popliteal fossa.  *See table(s) above for measurements and observations. Electronically signed  by Gaile New MD on 08/14/2024 at 7:07:20 PM.    Final       Kaeson Kleinert T. Deloria Brassfield [facility]  If 7PM-7AM, please contact night-coverage [email or web address] 08/15/2024, 2:10 PM   "

## 2024-08-15 NOTE — Consult Note (Signed)
 "   ORTHOPAEDIC CONSULTATION  REQUESTING PHYSICIAN: Kathrin Mignon DASEN, MD  PCP:  Perri Starleen BROCKS, MD  Chief Complaint: evaluate right knee  HPI: Cassidy Olsen is a 64 y.o. female who presented to the emergency department on 08/11/2024 for less than 1 week of right knee pain and swelling.  In the ED, she was febrile to 103.1 degrees.  White blood cell count normal.  EDP attempted to aspirate her right knee twice, and no fluid was able to be obtained.  She was admitted by [facility] and started on IV antibiotics.  Dr. Beuford was consulted for her knee.  She subsequently had an MRI of the right knee which did not show a large effusion or other evidence of infection.  Patient has a longstanding history of low back pain and radicular leg pain, worse on the right side.  She sees pain management in [place].  Last week, she saw pain management for worsening right lower extremity radiculopathy.  She has been increasing her home exercise program to help with her back per their recommendation.  After 1 of these home exercise sessions, she had right knee pain and swelling.  She reports that her knee is now doing much better, but her low back and radicular right leg pain persists.  She has [place]  She has had chronic pain and stiffness in the knee.  I saw her in the office in 2024, and workup was negative for infection and loosening.  Past Medical History:  Diagnosis Date   ADHD    Arthritis 2010   Asthma    Back pain    Chronic headaches    Chronic pain    Constipation    Edema of both lower extremities    Family history of breast cancer    Family history of prostate cancer    Gallstones 2018   GERD (gastroesophageal reflux disease)    High cholesterol    History of kidney stones    Hx: UTI (urinary tract infection)    Hypertension    IBS (irritable bowel syndrome)    Interstitial cystitis 2012   Joint pain    Kidney problem     Migraine    Occipital neuralgia    Osteoarthritis    PNA (pneumonia) 2018   Posttraumatic muscle contracture    Pre-diabetes    Spinal stenosis    Trigeminal neuralgia    Past Surgical History:  Procedure Laterality Date   ABDOMINAL HYSTERECTOMY     ANKLE SURGERY Left 05/2009   APPENDECTOMY     Benign Tumor Removal Right 01/26/2017   right upper arm by Dr. Dale Melia at [facility] [place]    BRAIN SURGERY     CARPAL TUNNEL RELEASE Right 2014   CHOLECYSTECTOMY N/A 12/24/2020   Procedure: LAPAROSCOPIC CHOLECYSTECTOMY;  Surgeon: Vernetta Berg, MD;  Location: [facility] OR;  Service: General;  Laterality: N/A;   COLONOSCOPY     CYSTOSCOPY W/ URETERAL STENT PLACEMENT Left 07/25/2019   Procedure: CYSTOSCOPY WITH RETROGRADE PYELOGRAM/URETERAL STENT PLACEMENT;  Surgeon: Matilda Senior, MD;  Location: [facility] ORS;  Service: Urology;  Laterality: Left;   CYSTOSCOPY W/ URETERAL STENT REMOVAL Left 08/15/2019   Procedure: CYSTOSCOPY WITH STENT REMOVAL;  Surgeon: Matilda Senior, MD;  Location: [facility];  Service: Urology;  Laterality: Left;   CYSTOSCOPY WITH RETROGRADE PYELOGRAM, URETEROSCOPY AND STENT PLACEMENT Left 08/15/2019   Procedure: CYSTOSCOPY WITH RETROGRADE PYELOGRAM, URETEROSCOPY;  Surgeon: Matilda Senior, MD;  Location: [facility];  Service: Urology;  Laterality: Left;  90 MINS   CYSTOSCOPY WITH RETROGRADE PYELOGRAM, URETEROSCOPY AND STENT PLACEMENT Right 09/12/2019   Procedure: CYSTOSCOPY WITH RIGHT RETROGRADE PYELOGRAM  URETEROSCOPY WITH HOLMIUM LASER STONE EXTRACTION AND STENT PLACEMENT;  Surgeon: Matilda Senior, MD;  Location: [facility];  Service: Urology;  Laterality: Right;   DILATION AND CURETTAGE OF UTERUS     HOLMIUM LASER APPLICATION Left 08/15/2019   Procedure: HOLMIUM LASER APPLICATION;  Surgeon: Matilda Senior, MD;  Location: [facility];  Service: Urology;  Laterality: Left;   JOINT REPLACEMENT     R knee   KIDNEY STONE SURGERY     neuro spine similator  01/2021    OVARIAN CYST REMOVAL  05/2010   right knee revision  2016   ROTATOR CUFF REPAIR Right 02/2019   SPINE SURGERY     x 3   TUBAL LIGATION     Social History   Socioeconomic History   Marital status: Single    Spouse name: Not on file   Number of children: 1   Years of education: Not on file   Highest education level: Not on file  Occupational History   Occupation: retired   Occupation: consulting civil engineer  Tobacco Use   Smoking status: Never   Smokeless tobacco: Never  Vaping Use   Vaping status: Never Used  Substance and Sexual Activity   Alcohol use: Yes    Comment: socially   Drug use: No   Sexual activity: Yes    Birth control/protection: Surgical  Other Topics Concern   Not on file  Social History Narrative   Not on file   Social Drivers of Health   Tobacco Use: Low Risk (08/11/2024)   Patient History    Smoking Tobacco Use: Never    Smokeless Tobacco Use: Never    Passive Exposure: Not on file  Financial Resource Strain: Low Risk (04/12/2023)   Received from [facility]   Overall Financial Resource Strain (CARDIA)    Difficulty of Paying Living Expenses: Not very hard  Food Insecurity: No Food Insecurity (08/11/2024)   Epic    Worried About Radiation Protection Practitioner of Food in the Last Year: Never true    Ran Out of Food in the Last Year: Never true  Transportation Needs: No Transportation Needs (08/11/2024)   Epic    Lack of Transportation (Medical): No    Lack of Transportation (Non-Medical): No  Physical Activity: Insufficiently Active (04/12/2023)   Received from [facility]   Exercise Vital Sign    On average, how many days per week do you engage in moderate to strenuous exercise (like a brisk walk)?: 2 days    On average, how many minutes do you engage in exercise at this level?: 20 min  Stress: No Stress Concern Present (04/12/2023)   Received from [facility] - Occupational Stress Questionnaire    Feeling of Stress : Only a little   Social Connections: Unknown (08/11/2024)   Social Connection and Isolation Panel    Frequency of Communication with Friends and Family: More than three times a week    Frequency of Social Gatherings with Friends and Family: Not on file    Attends Religious Services: Not on file    Active Member of Clubs or Organizations: Not on file    Attends Banker Meetings: Not on file    Marital Status: Not on file  Depression (PHQ2-9): Low Risk (12/05/2022)   Depression (PHQ2-9)  PHQ-2 Score: 2  Alcohol Screen: Low Risk (12/05/2022)   Alcohol Screen    Last Alcohol Screening Score (AUDIT): 1  Housing: Low Risk (08/11/2024)   Epic    Unable to Pay for Housing in the Last Year: No    Number of Times Moved in the Last Year: 0    Homeless in the Last Year: No  Utilities: Not At Risk (08/11/2024)   Epic    Threatened with loss of utilities: No  Health Literacy: Not on file   Family History  Problem Relation Age of Onset   Ulcers Mother 49       peptic ulcer   Irritable bowel syndrome Mother    High blood pressure Mother    Sudden death Mother    Depression Mother    Prostate cancer Father 47   Heart disease Father    Irritable bowel syndrome Father    High blood pressure Father    Breast cancer Sister 25       ER+/PR+/Her2- breast cancer   Alzheimer's disease Sister    Hypertension Sister    Kidney disease Sister    Celiac disease Sister    Thyroid  disease Sister    Breast cancer Paternal Grandmother    Diabetes Paternal Grandmother    Cancer Paternal Uncle        unknown cancers   Liver disease Neg Hx    Esophageal cancer Neg Hx    Colon cancer Neg Hx    Allergies[1] Prior to Admission medications  Medication Sig Start Date End Date Taking? Authorizing Provider  ACETAMINOPHEN  PO Take 1,300 mg by mouth 2 (two) times daily.   Yes [provider]  albuterol  (VENTOLIN  HFA) 108 (90 Base) MCG/ACT inhaler Inhale 2 puffs into the lungs every 6 (six) hours as needed  for wheezing or shortness of breath. 06/02/22  Yes Icard, Adine CROME, DO  amitriptyline  (ELAVIL ) 50 MG tablet TAKE 1 TABLET BY MOUTH EVERYDAY AT BEDTIME 02/23/24  Yes Ines Onetha NOVAK, MD  amLODipine  (NORVASC ) 2.5 MG tablet Take 1 tablet by mouth daily. 12/11/23  Yes [provider]  atorvastatin  (LIPITOR) 20 MG tablet Take 1 tablet (20 mg total) by mouth daily. 07/30/24  Yes Court Dorn PARAS, MD  botulinum toxin Type A  (BOTOX ) 200 units injection INJECT 155 UNITS INTRAMUSCULARLY EVERY 12 WEEKS (GIVEN AT MD  OFFICE, DISCARD UNUSED) 06/18/24  Yes Lomax, Amy, NP  cetirizine (ZYRTEC) 10 MG tablet Take 10 mg by mouth daily.   Yes [provider]  dexlansoprazole  (DEXILANT ) 60 MG capsule Take 1 capsule (60 mg total) by mouth daily. 02/21/24  Yes Nandigam, Kavitha V, MD  dicyclomine  (BENTYL ) 10 MG capsule Take 2 capsules (20 mg total) by mouth in the morning and at bedtime. PRN 02/21/24  Yes Nandigam, Kavitha V, MD  ELDERBERRY PO ELDERBERRY IMMUNE SUPPORT   Yes [provider]  fluticasone  (FLONASE ) 50 MCG/ACT nasal spray Place 2 sprays into both nostrils daily. 06/08/20  Yes Icard, Bradley L, DO  fluticasone -salmeterol (WIXELA INHUB) 250-50 MCG/ACT AEPB Inhale 1 puff into the lungs in the morning and at bedtime. 07/30/24  Yes Hope Almarie ORN, NP  gabapentin  (NEURONTIN ) 300 MG capsule Take 3 capsules (900 mg total) by mouth 3 (three) times daily. 05/24/22  Yes Lomax, Amy, NP  lidocaine  4 % Place 1 patch onto the skin as needed.   Yes [provider]  lisinopril  (ZESTRIL ) 20 MG tablet Take 20 mg by mouth 2 (two) times daily. 06/03/24  Yes [provider]  lubiprostone  (AMITIZA ) 24 MCG capsule TAKE 1 CAPSULE (24 MCG TOTAL) BY MOUTH 2 (TWO) TIMES DAILY WITH A MEAL. 05/22/24  Yes Nandigam, Kavitha V, MD  montelukast  (SINGULAIR ) 10 MG tablet TAKE 1 TABLET BY MOUTH EVERYDAY AT BEDTIME 07/29/24  Yes Hunsucker, Donnice SAUNDERS, MD  MOVANTIK  25 MG TABS tablet TAKE 1 TABLET (25 MG  TOTAL) BY MOUTH DAILY. 05/28/24  Yes Nandigam, Kavitha V, MD  Omega-3 Fatty Acids (OMEGA III EPA+DHA) 1000 MG CAPS Take by mouth. 09/22/22  Yes [provider]  ondansetron  (ZOFRAN ) 4 MG tablet Take 4 mg by mouth every 8 (eight) hours as needed. 08/02/24  Yes [provider]  Oregano Oil-Flaxseed Oil (OREGANO OIL IMMUNE SUPPORT) 50-25 MG CAPS    Yes [provider]  Spacer/Aero-Holding Chambers (AEROCHAMBER MV) inhaler Use as instructed 04/18/18  Yes Icard, Bradley L, DO  sucralfate  (CARAFATE ) 1 g tablet Take 1 tablet (1 g total) by mouth 4 (four) times daily -  with meals and at bedtime. 08/06/24  Yes Zehr, Jessica D, PA-C  Suzetrigine  50 MG TABS Take 50 mg by mouth in the morning and at bedtime. 08/06/24 09/05/24 Yes [provider]  timolol  (TIMOPTIC ) 0.5 % ophthalmic solution 1-2 drops eyes at onset and 30 minutes after if needed. Can repeat in 2 hours for headache/migraine. 11/06/23  Yes Ines Onetha NOVAK, MD  traMADol  (ULTRAM ) 50 MG tablet Take 100 mg by mouth 3 (three) times daily. 2x daily   Yes [provider]  valACYclovir  (VALTREX ) 500 MG tablet Take 500 mg by mouth 2 (two) times daily as needed (outbreaks).    Yes [provider]  Vitamin D , Cholecalciferol , 25 MCG (1000 UT) CAPS Take 1 capsule by mouth daily.   Yes [provider]  celecoxib  (CELEBREX ) 200 MG capsule Take 200 mg by mouth 2 (two) times daily. Patient not taking: Reported on 08/11/2024    [provider]  Fremanezumab -vfrm (AJOVY ) 225 MG/1.5ML SOAJ Inject 225 mg into the skin every 30 (thirty) days. 10/25/23   Lomax, Amy, NP  furosemide  (LASIX ) 20 MG tablet Take 1 tablet (20 mg total) by mouth as needed. 08/13/24   Hope Almarie ORN, NP  glycopyrrolate  (ROBINUL ) 2 MG tablet Take 1 tablet (2 mg total) by mouth 2 (two) times daily. 04/19/23   Kerman Vina HERO, NP   VAS US  LOWER EXTREMITY VENOUS (DVT) Result Date: 08/14/2024  Lower Venous DVT Study Patient Name:   Cassidy Olsen  Date of Exam:   08/14/2024 Medical Rec #: [id number]        Accession #:    [id number] Date of Birth: Feb 28, 1961        Patient Gender: F Patient Age:   62 years Exam Location[facility] Procedure:      VAS US  LOWER EXTREMITY VENOUS (DVT) Referring Phys: MIGNON GONFA --------------------------------------------------------------------------------  Indications: Pain.  Risk Factors: None identified. Comparison Study: No prior studies. Performing Technologist: Cordella Collet RVT  Examination Guidelines: A complete evaluation includes B-mode imaging, spectral Doppler, color Doppler, and power Doppler as needed of all accessible portions of each vessel. Bilateral testing is considered an integral part of a complete examination. Limited examinations for reoccurring indications may be performed as noted. The reflux portion of the exam is performed with the patient in reverse Trendelenburg.  +---------+---------------+---------+-----------+----------+--------------+ RIGHT    CompressibilityPhasicitySpontaneityPropertiesThrombus Aging +---------+---------------+---------+-----------+----------+--------------+ CFV      Full           Yes  Yes                                 +---------+---------------+---------+-----------+----------+--------------+ SFJ      Full                                                        +---------+---------------+---------+-----------+----------+--------------+ FV Prox  Full                                                        +---------+---------------+---------+-----------+----------+--------------+ FV Mid   Full                                                        +---------+---------------+---------+-----------+----------+--------------+ FV DistalFull                                                        +---------+---------------+---------+-----------+----------+--------------+ PFV      Full                                                         +---------+---------------+---------+-----------+----------+--------------+ POP      Full           Yes      Yes                                 +---------+---------------+---------+-----------+----------+--------------+ PTV      Full                                                        +---------+---------------+---------+-----------+----------+--------------+ PERO     Full                                                        +---------+---------------+---------+-----------+----------+--------------+   +---------+---------------+---------+-----------+----------+--------------+ LEFT     CompressibilityPhasicitySpontaneityPropertiesThrombus Aging +---------+---------------+---------+-----------+----------+--------------+ CFV      Full           Yes      Yes                                 +---------+---------------+---------+-----------+----------+--------------+ SFJ      Full                                                        +---------+---------------+---------+-----------+----------+--------------+  FV Prox  Full                                                        +---------+---------------+---------+-----------+----------+--------------+ FV Mid   Full                                                        +---------+---------------+---------+-----------+----------+--------------+ FV DistalFull                                                        +---------+---------------+---------+-----------+----------+--------------+ PFV      Full                                                        +---------+---------------+---------+-----------+----------+--------------+ POP      Full           Yes      Yes                                 +---------+---------------+---------+-----------+----------+--------------+ PTV      Full                                                         +---------+---------------+---------+-----------+----------+--------------+ PERO     Full                                                        +---------+---------------+---------+-----------+----------+--------------+     Summary: RIGHT: - There is no evidence of deep vein thrombosis in the lower extremity.  - No cystic structure found in the popliteal fossa.  LEFT: - There is no evidence of deep vein thrombosis in the lower extremity.  - No cystic structure found in the popliteal fossa.  *See table(s) above for measurements and observations. Electronically signed by Gaile New MD on 08/14/2024 at 7:07:20 PM.    Final    DG HIP UNILAT WITH PELVIS 2-3 VIEWS LEFT Result Date: 08/13/2024 EXAM: 2 OR MORE VIEW(S) XRAY OF THE BILATERAL HIP 08/13/2024 01:10:00 PM COMPARISON: None available. CLINICAL HISTORY: Pain. FINDINGS: BONES AND JOINTS: No acute fracture. No malalignment. Mild bilateral hip osteoarthritis. SOFT TISSUES: Neurostimulator with generator in right lower quadrant noted. LUMBAR SPINE: Lower lumbar fusion noted. IMPRESSION: 1. No acute findings. 2. Mild bilateral hip osteoarthritis. Electronically signed by: Greig Pique MD 08/13/2024 08:36 PM EST RP Workstation: [id number]    Positive ROS: All other systems have been reviewed and were  otherwise negative with the exception of those mentioned in the HPI and as above.  Physical Exam: General: Alert, no acute distress Cardiovascular: No pedal edema Respiratory: No cyanosis, no use of accessory musculature GI: No organomegaly, abdomen is soft and non-tender Skin: No lesions in the area of chief complaint Neurologic: Sensation intact distally Psychiatric: Patient is competent for consent with normal mood and affect Lymphatic: No axillary or cervical lymphadenopathy  MUSCULOSKELETAL: Examination of the right knee reveals a healed anterior incision.  She has mild soft tissue swelling.  No warmth, no erythema, no effusion.  No significant  tenderness to palpation.  She has painless active range of motion of the knee.  She has restricted range of motion, at baseline due to arthrofibrosis.  No extensor lag.  No significant pain with logrolling of the hip.  Distally, there is no focal motor deficit.  Palpable pedal pulses.  Good  Assessment: Chronic pain and arthrofibrosis, right total knee replacement, done at outside facility. Chronic low back pain with increasing right lower extremity radiculopathy.  Plan: I discussed the findings with the patient.  Her knee is benign on exam.  She is afebrile.  Serum WBC normal.  Dry tap x 2 of the right knee in the emergency department.  MRI did not show significant effusion.  She does not have an effusion on exam, so repeat attempt at aspirating the knee is not indicated.  I think her right knee on presentation pain was due to increased intensity of home exercise program after seeing her pain management specialist and reflects overuse.  She reports that her knee is back to baseline.  I do not think her knee is the source of her fever.  At this point, I recommend MRI lumbar spine to rule out epidural abscess.  If negative, recommend further workup by primary team to find source.  She can follow-up with me as needed.   Redell JINNY Shoals, MD [phone]    08/15/2024 10:36 AM     [1]  Allergies Allergen Reactions   Aspirin Other (See Comments), Nausea And Vomiting and Nausea Only    Stomach upset Avoids due to IBS Due to ibs Stomach upset Due to ibs Avoids due to IBS   Ketorolac  Hives   Ketorolac  Tromethamine  Shortness Of Breath, Itching and Other (See Comments)    IM administration ONLY  REDNESS IM administration ONLY  REDNESS  SWELLING OF THE THROAT   ORAL TORADOL  CAUSED EXTREME REDNESS, ITCHINESS, DIFFICULTY BREATHING, AND FACIAL SWELLING. PATIENT ALSO DIAGNOSED WITH BELL'S PALSY.   Penicillins Hives, Itching, Rash and Other (See Comments)    Did it involve swelling of the  face/tongue/throat, SOB, or low BP? No Did it involve sudden or severe rash/hives, skin peeling, or any reaction on the inside of your mouth or nose? Yes Did you need to seek medical attention at a hospital or doctor's office? No When did it last happen?      2000 If all above answers are NO, may proceed with cephalosporin use.  Vaginal rash Vaginal rash Did it involve swelling of the face/tongue/throat, SOB, or low BP? No Did it involve sudden or severe rash/hives, skin peeling, or any reaction on the inside of your mouth or nose? Yes Did you need to seek medical attention at a hospital or doctor's office? No When did it last happen?      2000 If all above answers are NO, may proceed with cephalosporin use. Vaginal rash Vaginal rash   Codeine Other (  See Comments)    Cough meds with codeine-have withdraw symptoms when done Cough meds with codeine-have withdraw symptoms when done Cough meds with codeine-have withdraw symptoms when done   Ibuprofen Other (See Comments) and Nausea And Vomiting    Stomach upset Avoids due to IBS Stomach upset Avoids due to IBS   Latex Rash and Other (See Comments)    Vaginal irritation Vaginal irritation Exacerbates genital herpes Exacerbates genital herpes   Omeprazole Magnesium Other (See Comments)    Lack of therapeutic effect Lack of therapeutic effect   Pantoprazole  Sodium Other (See Comments)    Lack of therapeutic effect Lack of therapeutic effect   Penicillamine Rash   Pregabalin Other (See Comments)    Weight gain Weight gain   Phenazopyridine      Other Reaction(s): angioedema   Pyridium  [Phenazopyridine  Hcl] Swelling    Lip swelling and burning   "

## 2024-08-15 NOTE — Consult Note (Signed)
 "                                                                  [facility]                                                                                        Patient Identification: Patient Name: Cassidy Olsen MRN: [id number] Admit Date: 08/11/2024  8:04 AM Today's Date: 08/15/2024 Reason for consult: fevers, rt knee cellulitis ? Requesting provider: Dr Kathrin   Principal Problem:   SIRS (systemic inflammatory response syndrome) (HCC) Active Problems:   Trigeminal neuralgia of right side of face   Occipital neuralgia   Essential hypertension   Irritable bowel syndrome with constipation   Hyperlipidemia   Depression   Obesity (BMI 30-39.9)   Gastroesophageal reflux disease   Acute pain of right knee  Antibiotics:  Vancomycin  1/4- Cefepime  1/4- Metronidazole  1/4- Valacyclovir  1/6-   Lines/Hardware: RT TKA  Assessment # Fevers  - unclear source ? #2 - Unlikely to be rt knee septic arthritis  # Reactive small suprapatellar prosthetic knee joint effusion - in the setting of over use   # Transaminitis  - not significantly elevated   # Chronic low back pain w radiculopathy  - Ortho recommending to get MRLI L spine to r/o epidural abscess   Recommendations  - Continue vancomycin , pharmacy to dose.  Will change IV cefepime  to IV ceftriaxone .  No concerns for Pseudomonas infection - Follow-up blood cultures - Follow-up MRI L-spine - Monitor fevers - Universal/standard isolation precautions D/W primary team Following  Rest of the management as per the primary team. Please call with questions or concerns.  Thank you for the consult  __________________________________________________________________________________________________________ HPI and Hospital Course: 64 year old female with prior history as below including right TKA, HTN, HLD, Arthritis, Interstitial cystitis, IBS, ADHD, depression migraine, trigeminal and occipital  neuralgia who presented to the ED on 1/4 with fevers, sudden onset right knee pain and swelling for 2 days. She reports following a provider for lumbar stenosis back injection.  She was recommended to do some exercises last week and suddenly developed right knee pain swelling since last Friday which continued to get worse over Saturday and Sunday which led her to come to ED. She reports never exercising before.  Denies any known injury or insect bite to right knee or sick contacts.  Right TKA in 2015 followed by revision in 2017. Denies any cough, chest pain or shortness of breath .  Denies nausea, vomiting abdominal pain. Denies GU symptoms .  Denies pain in other joints or rashes. She has intermittent diarrhea and constipation due to IBS with no recent changes.   She lives alone, partner is in [place] , in a committed relationship.  Has a dog at home. Denies any recent travel.   At ED febrile, tachycardic Labs remarkable for AST 60, ALT 45, LA0.9, WBC 13.3, ESR  49 Influenza A/influenza B/RSV/SARS-CoV-2 negative UA unremarkable for UTI. Urin ecx multiple sp 1/4 blood cx 2/2 sets NG in 4 days    Arthrocentesis was attempted twice in the ED by Ortho, unable to get any fluid Was given IV morphine , IV ondansetron , p.o. acetaminophen , IV lidocaine , IVF, vancomycin , cefepime  and IV metronidazole   IMPRESSION: 1. No specific MRI indicator of septic arthritis or active osteomyelitis; small suprapatellar joint effusion with mild marginal synovial enhancement, and arthrocentesis is warranted if there is a high clinical index of suspicion. 2. 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 3. Substantial metal artifact and field heterogeneity from the knee prosthesis limit evaluation.  ROS: General- Denies fever, chills, loss of appetite and loss of weight HEENT - Denies headache, blurry vision, neck pain, sinus pain Chest - Denies any chest pain,  SOB or cough CVS- Denies any dizziness/lightheadedness, syncopal attacks, palpitations Abdomen- Denies any nausea, vomiting, abdominal pain, hematochezia Neuro - Denies any weakness, numbness, tingling sensation Psych - Denies any changes in mood irritability or depressive symptoms GU- Denies any burning, dysuria, hematuria or increased frequency of urination Skin - denies any rashes/lesions MSK - rt knee with mild swelling, pain   Past Medical History:  Diagnosis Date   ADHD    Arthritis 2010   Asthma    Back pain    Chronic headaches    Chronic pain    Constipation    Edema of both lower extremities    Family history of breast cancer    Family history of prostate cancer    Gallstones 2018   GERD (gastroesophageal reflux disease)    High cholesterol    History of kidney stones    Hx: UTI (urinary tract infection)    Hypertension    IBS (irritable bowel syndrome)    Interstitial cystitis 2012   Joint pain    Kidney problem    Migraine    Occipital neuralgia    Osteoarthritis    PNA (pneumonia) 2018   Posttraumatic muscle contracture    Pre-diabetes    Spinal stenosis    Trigeminal neuralgia    Past Surgical History:  Procedure Laterality Date   ABDOMINAL HYSTERECTOMY     ANKLE SURGERY Left 05/2009   APPENDECTOMY     Benign Tumor Removal Right 01/26/2017   right upper arm by Dr. Dale Melia at [facility] [place]    BRAIN SURGERY     CARPAL TUNNEL RELEASE Right 2014   CHOLECYSTECTOMY N/A 12/24/2020   Procedure: LAPAROSCOPIC CHOLECYSTECTOMY;  Surgeon: Vernetta Berg, MD;  Location: [facility] OR;  Service: General;  Laterality: N/A;   COLONOSCOPY     CYSTOSCOPY W/ URETERAL STENT PLACEMENT Left 07/25/2019   Procedure: CYSTOSCOPY WITH RETROGRADE PYELOGRAM/URETERAL STENT PLACEMENT;  Surgeon: Matilda Senior, MD;  Location: [facility] ORS;  Service: Urology;  Laterality: Left;   CYSTOSCOPY W/ URETERAL STENT REMOVAL Left 08/15/2019   Procedure: CYSTOSCOPY WITH STENT REMOVAL;   Surgeon: Matilda Senior, MD;  Location: [facility];  Service: Urology;  Laterality: Left;   CYSTOSCOPY WITH RETROGRADE PYELOGRAM, URETEROSCOPY AND STENT PLACEMENT Left 08/15/2019   Procedure: CYSTOSCOPY WITH RETROGRADE PYELOGRAM, URETEROSCOPY;  Surgeon: Matilda Senior, MD;  Location: [facility];  Service: Urology;  Laterality: Left;  90 MINS   CYSTOSCOPY WITH RETROGRADE PYELOGRAM, URETEROSCOPY AND STENT PLACEMENT Right 09/12/2019   Procedure: CYSTOSCOPY WITH RIGHT RETROGRADE PYELOGRAM  URETEROSCOPY WITH HOLMIUM LASER STONE EXTRACTION AND STENT PLACEMENT;  Surgeon: Matilda Senior, MD;  Location: [facility];  Service: Urology;  Laterality: Right;   DILATION AND CURETTAGE OF UTERUS     HOLMIUM LASER APPLICATION Left 08/15/2019   Procedure: HOLMIUM LASER APPLICATION;  Surgeon: Matilda Senior, MD;  Location: [facility];  Service: Urology;  Laterality: Left;   JOINT REPLACEMENT     R knee   KIDNEY STONE SURGERY     neuro spine similator  01/2021   OVARIAN CYST REMOVAL  05/2010   right knee revision  2016   ROTATOR CUFF REPAIR Right 02/2019   SPINE SURGERY     x 3   TUBAL LIGATION       Scheduled Meds:  amitriptyline   50 mg Oral QHS   amLODipine   2.5 mg Oral Daily   atorvastatin   20 mg Oral QHS   celecoxib   200 mg Oral BID   cholecalciferol   1,000 Units Oral QHS   dexlansoprazole   60 mg Oral Daily   fluticasone   2 spray Each Nare Daily   fluticasone  furoate-vilanterol  1 puff Inhalation Daily   Fremanezumab -vfrm  225 mg Subcutaneous Q30 days   gabapentin   900 mg Oral TID   heparin   5,000 Units Subcutaneous Q8H   lubiprostone   24 mcg Oral BID WC   montelukast   10 mg Oral QHS   naloxegol  oxalate  25 mg Oral QHS   polyethylene glycol  17 g Oral BID   senna-docusate  1 tablet Oral BID   sucralfate   1 g Oral TID WC & HS   Suzetrigine   50 mg Oral BID   traMADol   100 mg Oral TID   valACYclovir   1,000 mg Oral Daily   Continuous Infusions:  ceFEPime  (MAXIPIME ) IV 2 g (08/15/24 0520)    vancomycin  1,500 mg (08/14/24 1449)   PRN Meds:.acetaminophen  **OR** acetaminophen , albuterol , dicyclomine , hydrALAZINE , methocarbamol  (ROBAXIN ) injection, morphine  injection, ondansetron  **OR** ondansetron  (ZOFRAN ) IV, oxyCODONE , timolol , valACYclovir   Allergies[1]  Social History   Socioeconomic History   Marital status: Single    Spouse name: Not on file   Number of children: 1   Years of education: Not on file   Highest education level: Not on file  Occupational History   Occupation: retired   Occupation: consulting civil engineer  Tobacco Use   Smoking status: Never   Smokeless tobacco: Never  Vaping Use   Vaping status: Never Used  Substance and Sexual Activity   Alcohol use: Yes    Comment: socially   Drug use: No   Sexual activity: Yes    Birth control/protection: Surgical  Other Topics Concern   Not on file  Social History Narrative   Not on file   Social Drivers of Health   Tobacco Use: Low Risk (08/11/2024)   Patient History    Smoking Tobacco Use: Never    Smokeless Tobacco Use: Never    Passive Exposure: Not on file  Financial Resource Strain: Low Risk (04/12/2023)   Received from [facility]   Overall Financial Resource Strain (CARDIA)    Difficulty of Paying Living Expenses: Not very hard  Food Insecurity: No Food Insecurity (08/11/2024)   Epic    Worried About Radiation Protection Practitioner of Food in the Last Year: Never true    [employer] of Food in the Last Year: Never true  Transportation Needs: No Transportation Needs (08/11/2024)   Epic    Lack of Transportation (Medical): No    Lack of Transportation (Non-Medical): No  Physical Activity: Insufficiently Active (04/12/2023)   Received from [facility]   Exercise Vital Sign    On average, how many  days per week do you engage in moderate to strenuous exercise (like a brisk walk)?: 2 days    On average, how many minutes do you engage in exercise at this level?: 20 min  Stress: No Stress Concern Present (04/12/2023)   Received from [facility] - Occupational Stress Questionnaire    Feeling of Stress : Only a little  Social Connections: Unknown (08/11/2024)   Social Connection and Isolation Panel    Frequency of Communication with Friends and Family: More than three times a week    Frequency of Social Gatherings with Friends and Family: Not on file    Attends Religious Services: Not on file    Active Member of Clubs or Organizations: Not on file    Attends Banker Meetings: Not on file    Marital Status: Not on file  Intimate Partner Violence: Not At Risk (08/11/2024)   Epic    Fear of Current or Ex-Partner: No    Emotionally Abused: No    Physically Abused: No    Sexually Abused: No  Depression (PHQ2-9): Low Risk (12/05/2022)   Depression (PHQ2-9)    PHQ-2 Score: 2  Alcohol Screen: Low Risk (12/05/2022)   Alcohol Screen    Last Alcohol Screening Score (AUDIT): 1  Housing: Low Risk (08/11/2024)   Epic    Unable to Pay for Housing in the Last Year: No    Number of Times Moved in the Last Year: 0    Homeless in the Last Year: No  Utilities: Not At Risk (08/11/2024)   Epic    Threatened with loss of utilities: No  Health Literacy: Not on file   Family History  Problem Relation Age of Onset   Ulcers Mother 6       peptic ulcer   Irritable bowel syndrome Mother    High blood pressure Mother    Sudden death Mother    Depression Mother    Prostate cancer Father 54   Heart disease Father    Irritable bowel syndrome Father    High blood pressure Father    Breast cancer Sister 40       ER+/PR+/Her2- breast cancer   Alzheimer's disease Sister    Hypertension Sister    Kidney disease Sister    Celiac disease Sister    Thyroid  disease Sister    Breast cancer Paternal Grandmother    Diabetes Paternal Grandmother    Cancer Paternal Uncle        unknown cancers   Liver disease Neg Hx    Esophageal cancer Neg Hx    Colon cancer Neg Hx    Vitals BP 127/88 (BP  Location: Right Arm, Patient Position: Sitting)   Pulse (!) 103   Temp 98.5 F (36.9 C) (Oral)   Resp 18   Ht 5' 3 (1.6 m)   Wt 83.1 kg   SpO2 100%   BMI 32.46 kg/m    Physical Exam Constitutional: Adult female sitting in the bed, nontoxic-appearing    Comments: HEENT WNL  Cardiovascular:     Rate and Rhythm: Normal rate    Heart sounds: S1 and S2  Pulmonary:     Effort: Pulmonary effort is normal.     Comments: Normal breath sounds  Abdominal:     Palpations: Abdomen is soft.     Tenderness: Nondistended and nontender  Musculoskeletal:        General: No swelling or tenderness in peripheral joints  Right knee TKA-mild swelling but no erythema, warmth or significant tenderness.  Range of motion is limited  Skin:    Comments: no rashes   Neurological:     General: Alert and oriented, grossly nonfocal  Psychiatric:        Mood and Affect: Mood normal.    Pertinent Microbiology Results for orders placed or performed during the hospital encounter of 08/11/24  Blood culture (routine x 2)     Status: None (Preliminary result)   Collection Time: 08/11/24  8:55 AM   Specimen: BLOOD LEFT HAND  Result Value Ref Range Status   Specimen Description   Final    BLOOD LEFT HAND Performed at [facility], [address] [place], [place] [address]    Special Requests   Final    BOTTLES DRAWN AEROBIC AND ANAEROBIC Blood Culture adequate volume Performed at [facility], [address] [place], [place] [address]    Culture   Final    NO GROWTH 4 DAYS Performed at [facility], [address], [place], [place] [address]    Report Status PENDING  Incomplete  Blood culture (routine x 2)     Status: None (Preliminary result)   Collection Time: 08/11/24  8:55 AM   Specimen: BLOOD  Result Value Ref Range Status   Specimen Description   Final    BLOOD RIGHT ANTECUBITAL Performed at [facility], [address], [place], [place] [address]    Special Requests   Final    BOTTLES DRAWN AEROBIC AND ANAEROBIC Blood Culture adequate volume Performed at [facility], [address], [place], [place] [address]    Culture   Final    NO GROWTH 4 DAYS Performed at [facility], [address] [place], [place] [address]    Report Status PENDING  Incomplete  Resp panel by RT-PCR (RSV, Flu A&B, Covid) Peripheral     Status: None   Collection Time: 08/11/24  8:55 AM   Specimen: Peripheral; Nasal Swab  Result Value Ref Range Status   SARS Coronavirus 2 by RT PCR NEGATIVE NEGATIVE Final    Comment: (NOTE) SARS-CoV-2 target nucleic acids are NOT DETECTED.  The SARS-CoV-2 RNA is generally detectable in upper respiratory specimens during the acute phase of infection. The lowest concentration of SARS-CoV-2 viral copies this assay can detect is 138 copies/mL. A negative result does not preclude SARS-Cov-2 infection and should not be used as the sole basis for treatment or other patient management decisions. A negative result may occur with  improper specimen collection/handling, submission of specimen other than nasopharyngeal swab, presence of viral mutation(s) within the areas targeted by this assay, and inadequate number of viral copies(<138 copies/mL). A negative result must be combined with clinical observations, patient history, and epidemiological information. The expected result is Negative.  Fact Sheet for Patients:  bloggercourse.com  Fact Sheet for Healthcare Providers:  seriousbroker.it  This test is no t yet approved or cleared by the [place]  FDA and  has been authorized for detection and/or diagnosis of SARS-CoV-2 by FDA under an Emergency Use Authorization (EUA). This EUA will remain  in effect (meaning this test can be used) for the duration of the COVID-19 declaration under Section 564(b)(1) of the Act,  21 U.S.C.section 360bbb-3(b)(1), unless the authorization is terminated  or revoked sooner.       Influenza A by PCR NEGATIVE NEGATIVE Final   Influenza B by PCR NEGATIVE NEGATIVE Final  Comment: (NOTE) The Xpert Xpress SARS-CoV-2/FLU/RSV plus assay is intended as an aid in the diagnosis of influenza from Nasopharyngeal swab specimens and should not be used as a sole basis for treatment. Nasal washings and aspirates are unacceptable for Xpert Xpress SARS-CoV-2/FLU/RSV testing.  Fact Sheet for Patients: bloggercourse.com  Fact Sheet for Healthcare Providers: seriousbroker.it  This test is not yet approved or cleared by the [place]  FDA and has been authorized for detection and/or diagnosis of SARS-CoV-2 by FDA under an Emergency Use Authorization (EUA). This EUA will remain in effect (meaning this test can be used) for the duration of the COVID-19 declaration under Section 564(b)(1) of the Act, 21 U.S.C. section 360bbb-3(b)(1), unless the authorization is terminated or revoked.     Resp Syncytial Virus by PCR NEGATIVE NEGATIVE Final    Comment: (NOTE) Fact Sheet for Patients: bloggercourse.com  Fact Sheet for Healthcare Providers: seriousbroker.it  This test is not yet approved or cleared by the [place]  FDA and has been authorized for detection and/or diagnosis of SARS-CoV-2 by FDA under an Emergency Use Authorization (EUA). This EUA will remain in effect (meaning this test can be used) for the duration of the COVID-19 declaration under Section 564(b)(1) of the Act, 21 U.S.C. section 360bbb-3(b)(1), unless the authorization is terminated or revoked.  Performed at [facility], [address], [place], [place] [address]   Urine Culture     Status: Abnormal   Collection Time: 08/11/24 11:58 AM   Specimen: Urine, Clean Catch  Result Value  Ref Range Status   Specimen Description   Final    URINE, CLEAN CATCH Performed at [facility], [address], [place], [place] [address]    Special Requests   Final    NONE Performed at [facility], [address] [place], [place] [address]    Culture MULTIPLE SPECIES PRESENT, SUGGEST RECOLLECTION (A)  Final   Report Status 08/12/2024 FINAL  Final    Pertinent Lab seen by me:    Latest Ref Rng & Units 08/15/2024    6:03 AM 08/14/2024    6:00 AM 08/13/2024    5:34 AM  CBC  WBC 4.0 - 10.5 K/uL 5.8  6.5  7.0   Hemoglobin 12.0 - 15.0 g/dL 89.6  89.1  88.5   Hematocrit 36.0 - 46.0 % 31.6  32.2  34.7   Platelets 150 - 400 K/uL 374  353  312        Latest Ref Rng & Units 08/15/2024    6:03 AM 08/14/2024    6:00 AM 08/13/2024    5:34 AM  CMP  Glucose 70 - 99 mg/dL 893  882  90   BUN 8 - 23 mg/dL 8  7  9    Creatinine 0.44 - 1.00 mg/dL 9.23  9.18  9.17   Sodium 135 - 145 mmol/L 141  139  139   Potassium 3.5 - 5.1 mmol/L 4.0  3.5  3.6   Chloride 98 - 111 mmol/L 107  104  105   CO2 22 - 32 mmol/L 25  23  23    Calcium  8.9 - 10.3 mg/dL 9.3  9.3  9.7   Total Protein 6.5 - 8.1 g/dL 6.2     Total Bilirubin 0.0 - 1.2 mg/dL 0.3     Alkaline Phos 38 - 126 U/L 108     AST 15 - 41 U/L 32     ALT 0 - 44 U/L 75  Pertinent Imagings/Other Imagings Plain films and CT images have been personally visualized and interpreted; radiology reports have been reviewed. Decision making incorporated into the Impression / Recommendations.  VAS US  LOWER EXTREMITY VENOUS (DVT) Result Date: 08/14/2024  Lower Venous DVT Study Patient Name:  JULYSSA KYER  Date of Exam:   08/14/2024 Medical Rec #: [id number]        Accession #:    [id number] Date of Birth: 1961/06/21        Patient Gender: F Patient Age:   64 years Exam Location:  [facility] Procedure:      VAS US  LOWER EXTREMITY VENOUS (DVT) Referring Phys: MIGNON GONFA  --------------------------------------------------------------------------------  Indications: Pain.  Risk Factors: None identified. Comparison Study: No prior studies. Performing Technologist: Cordella Collet RVT  Examination Guidelines: A complete evaluation includes B-mode imaging, spectral Doppler, color Doppler, and power Doppler as needed of all accessible portions of each vessel. Bilateral testing is considered an integral part of a complete examination. Limited examinations for reoccurring indications may be performed as noted. The reflux portion of the exam is performed with the patient in reverse Trendelenburg.  +---------+---------------+---------+-----------+----------+--------------+ RIGHT    CompressibilityPhasicitySpontaneityPropertiesThrombus Aging +---------+---------------+---------+-----------+----------+--------------+ CFV      Full           Yes      Yes                                 +---------+---------------+---------+-----------+----------+--------------+ SFJ      Full                                                        +---------+---------------+---------+-----------+----------+--------------+ FV Prox  Full                                                        +---------+---------------+---------+-----------+----------+--------------+ FV Mid   Full                                                        +---------+---------------+---------+-----------+----------+--------------+ FV DistalFull                                                        +---------+---------------+---------+-----------+----------+--------------+ PFV      Full                                                        +---------+---------------+---------+-----------+----------+--------------+ POP      Full           Yes      Yes                                 +---------+---------------+---------+-----------+----------+--------------+  PTV      Full                                                         +---------+---------------+---------+-----------+----------+--------------+ PERO     Full                                                        +---------+---------------+---------+-----------+----------+--------------+   +---------+---------------+---------+-----------+----------+--------------+ LEFT     CompressibilityPhasicitySpontaneityPropertiesThrombus Aging +---------+---------------+---------+-----------+----------+--------------+ CFV      Full           Yes      Yes                                 +---------+---------------+---------+-----------+----------+--------------+ SFJ      Full                                                        +---------+---------------+---------+-----------+----------+--------------+ FV Prox  Full                                                        +---------+---------------+---------+-----------+----------+--------------+ FV Mid   Full                                                        +---------+---------------+---------+-----------+----------+--------------+ FV DistalFull                                                        +---------+---------------+---------+-----------+----------+--------------+ PFV      Full                                                        +---------+---------------+---------+-----------+----------+--------------+ POP      Full           Yes      Yes                                 +---------+---------------+---------+-----------+----------+--------------+ PTV      Full                                                        +---------+---------------+---------+-----------+----------+--------------+  PERO     Full                                                        +---------+---------------+---------+-----------+----------+--------------+     Summary: RIGHT: - There is no evidence of deep vein thrombosis in the  lower extremity.  - No cystic structure found in the popliteal fossa.  LEFT: - There is no evidence of deep vein thrombosis in the lower extremity.  - No cystic structure found in the popliteal fossa.  *See table(s) above for measurements and observations. Electronically signed by Gaile New MD on 08/14/2024 at 7:07:20 PM.    Final    DG HIP UNILAT WITH PELVIS 2-3 VIEWS LEFT Result Date: 08/13/2024 EXAM: 2 OR MORE VIEW(S) XRAY OF THE BILATERAL HIP 08/13/2024 01:10:00 PM COMPARISON: None available. CLINICAL HISTORY: Pain. FINDINGS: BONES AND JOINTS: No acute fracture. No malalignment. Mild bilateral hip osteoarthritis. SOFT TISSUES: Neurostimulator with generator in right lower quadrant noted. LUMBAR SPINE: Lower lumbar fusion noted. IMPRESSION: 1. No acute findings. 2. Mild bilateral hip osteoarthritis. Electronically signed by: Greig Pique MD 08/13/2024 08:36 PM EST RP Workstation: [id number]   MR KNEE RIGHT W WO CONTRAST Result Date: 08/13/2024 EXAM: MRI of the right Knee With and Without Contrast. 08/13/2024 09:16:07 AM TECHNIQUE: Multiplanar multisequence MRI of the right knee was performed without and with intravenous contrast. 8 mL of gadobutrol  (GADAVIST ) 1 MMOL/ML injection was administered. Metal artifact related to the knee prosthesis obscures surrounding findings. Metal artifact reduction sequences were employed. There is substantial field heterogeneity despite the use of metal artifact reduction sequencing, which lowers diagnostic sensitivity and specificity. COMPARISON: Radiographs 08/11/2024. CLINICAL HISTORY: Chronic right knee pain, total knee prosthesis, clinical concern is raised for septic arthritis. FINDINGS: MEDIAL MENISCUS: Surgically absent status post total knee arthroplasty, with much of the knee joint obscured by metal artifact. LATERAL MENISCUS: Surgically absent status post total knee arthroplasty, with much of the knee joint obscured by metal artifact. ANTERIOR CRUCIATE LIGAMENT:  Presumably surgically absent status post total knee arthroplasty, with much of the knee joint obscured by metal artifact. POSTERIOR CRUCIATE LIGAMENT: Presumably surgically absent status post total knee arthroplasty, with much of the knee joint obscured by metal artifact. EXTENSOR MECHANISM: Intact quadriceps and patellar tendons with speckled metal artifact anteriorly. Intact patellar retinacula. LATERAL COLLATERAL LIGAMENT COMPLEX: Intact IT band and biceps femoris tendon. Popliteal region grossly unremarkable. MEDIAL COLLATERAL LIGAMENT COMPLEX: The visualized portion of the medial collateral ligament appears intact. KNEE JOINT: A small knee joint effusion is thought to be present in the suprapatellar bursa with mild marginal synovial enhancement. No specific indicator of infection although arthrocentesis would be warranted if there is a high clinical index of suspicion. Much of the knee joint is obscured by metal artifact. Speckled metal artifact also noted in Hoffa's fat pad without compelling evidence of gas in the joint. BONE MARROW: No acute fracture or aggressive marrow replacing lesion. No specific or compelling indicators of active osteomyelitis. SOFT TISSUE: No focal abnormality. Mild vastus medialis atrophy. IMPRESSION: 1. No specific MRI indicator of septic arthritis or active osteomyelitis; small suprapatellar joint effusion with mild marginal synovial enhancement, and arthrocentesis is warranted if there is a high clinical index of suspicion. 2. 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 3. Substantial metal artifact  and field heterogeneity from the knee prosthesis limit evaluation. Electronically signed by: Ryan Salvage MD 08/13/2024 09:32 AM EST RP Workstation: [id number]   DG Chest Portable 1 View Result Date: 08/11/2024 CLINICAL DATA:  Fever EXAM: PORTABLE CHEST 1 VIEW COMPARISON:  June 07, 2024 FINDINGS: The heart size and  mediastinal contours are within normal limits. Both lungs are clear. The visualized skeletal structures are unremarkable. IMPRESSION: No active disease. Electronically Signed   By: Lynwood Landy Raddle M.D.   On: 08/11/2024 10:54   DG Knee Complete 4 Views Right Result Date: 08/11/2024 EXAM: 4 OR MORE VIEW(S) XRAY OF THE RIGHT KNEE 08/11/2024 08:43:40 AM COMPARISON: None available. CLINICAL HISTORY: pain pain FINDINGS: BONES AND JOINTS: Status post right total knee arthroplasty. No acute fracture. No malalignment. There is a mild suprapatellar bursal effusion. SOFT TISSUES: The soft tissues are unremarkable. IMPRESSION: 1. Status post right total knee arthroplasty. 2. Mild suprapatellar bursal effusion. Electronically signed by: Evalene Coho MD 08/11/2024 08:48 AM EST RP Workstation: [id number]   I personally spent a total of 81 minutes in the care of the patient today including preparing to see the patient, getting/reviewing separately obtained history, performing a medically appropriate exam/evaluation, counseling and educating, placing orders, documenting clinical information in the EHR, independently interpreting results, and communicating results.  Annalee Orem, MD Infectious Disease Physician [facility] Pager: [phone]      [1]  Allergies Allergen Reactions   Aspirin Other (See Comments), Nausea And Vomiting and Nausea Only    Stomach upset Avoids due to IBS Due to ibs Stomach upset Due to ibs Avoids due to IBS   Ketorolac  Hives   Ketorolac  Tromethamine  Shortness Of Breath, Itching and Other (See Comments)    IM administration ONLY  REDNESS IM administration ONLY  REDNESS  SWELLING OF THE THROAT   ORAL TORADOL  CAUSED EXTREME REDNESS, ITCHINESS, DIFFICULTY BREATHING, AND FACIAL SWELLING. PATIENT ALSO DIAGNOSED WITH BELL'S PALSY.   Penicillins Hives, Itching, Rash and Other (See Comments)    Did it involve swelling of the  face/tongue/throat, SOB, or low BP? No Did it involve sudden or severe rash/hives, skin peeling, or any reaction on the inside of your mouth or nose? Yes Did you need to seek medical attention at a hospital or doctor's office? No When did it last happen?      2000 If all above answers are NO, may proceed with cephalosporin use.  Vaginal rash Vaginal rash Did it involve swelling of the face/tongue/throat, SOB, or low BP? No Did it involve sudden or severe rash/hives, skin peeling, or any reaction on the inside of your mouth or nose? Yes Did you need to seek medical attention at a hospital or doctor's office? No When did it last happen?      2000 If all above answers are NO, may proceed with cephalosporin use. Vaginal rash Vaginal rash   Codeine Other (See Comments)    Cough meds with codeine-have withdraw symptoms when done Cough meds with codeine-have withdraw symptoms when done Cough meds with codeine-have withdraw symptoms when done   Ibuprofen Other (See Comments) and Nausea And Vomiting    Stomach upset Avoids due to IBS Stomach upset Avoids due to IBS   Latex Rash and Other (See Comments)    Vaginal irritation Vaginal irritation Exacerbates genital herpes Exacerbates genital herpes   Omeprazole Magnesium Other (See Comments)    Lack of therapeutic effect Lack of therapeutic effect   Pantoprazole  Sodium Other (See Comments)  Lack of therapeutic effect Lack of therapeutic effect   Penicillamine Rash   Pregabalin Other (See Comments)    Weight gain Weight gain   Phenazopyridine      Other Reaction(s): angioedema   Pyridium  [Phenazopyridine  Hcl] Swelling    Lip swelling and burning   "

## 2024-08-15 NOTE — Plan of Care (Signed)

## 2024-08-16 ENCOUNTER — Telehealth (HOSPITAL_COMMUNITY): Payer: Self-pay

## 2024-08-16 ENCOUNTER — Other Ambulatory Visit (HOSPITAL_COMMUNITY): Payer: Self-pay

## 2024-08-16 DIAGNOSIS — L03115 Cellulitis of right lower limb: Secondary | ICD-10-CM | POA: Diagnosis not present

## 2024-08-16 DIAGNOSIS — M25469 Effusion, unspecified knee: Secondary | ICD-10-CM

## 2024-08-16 DIAGNOSIS — M5416 Radiculopathy, lumbar region: Secondary | ICD-10-CM | POA: Diagnosis not present

## 2024-08-16 DIAGNOSIS — R6510 Systemic inflammatory response syndrome (SIRS) of non-infectious origin without acute organ dysfunction: Secondary | ICD-10-CM | POA: Diagnosis not present

## 2024-08-16 LAB — CULTURE, BLOOD (ROUTINE X 2)
Culture: NO GROWTH
Culture: NO GROWTH
Special Requests: ADEQUATE
Special Requests: ADEQUATE

## 2024-08-16 LAB — LACTIC ACID, PLASMA: Lactic Acid, Venous: 2.7 mmol/L (ref 0.5–1.9)

## 2024-08-16 LAB — CREATININE, SERUM
Creatinine, Ser: 0.77 mg/dL (ref 0.44–1.00)
GFR, Estimated: >60 mL/min

## 2024-08-16 MED ORDER — LINEZOLID 600 MG PO TABS
600.0000 mg | ORAL_TABLET | Freq: Two times a day (BID) | ORAL | Status: DC
  Administered 2024-08-16: 600 mg via ORAL
  Filled 2024-08-16: qty 1

## 2024-08-16 MED ORDER — METHOCARBAMOL 500 MG PO TABS
500.0000 mg | ORAL_TABLET | Freq: Three times a day (TID) | ORAL | 0 refills | Status: AC | PRN
  Filled 2024-08-16: qty 15, 5d supply, fill #0

## 2024-08-16 MED ORDER — LIDOCAINE 4 % EX PTCH
1.0000 | MEDICATED_PATCH | CUTANEOUS | 0 refills | Status: AC | PRN
  Filled 2024-08-16: qty 5, 5d supply, fill #0

## 2024-08-16 MED ORDER — LINEZOLID 600 MG PO TABS
600.0000 mg | ORAL_TABLET | Freq: Two times a day (BID) | ORAL | 0 refills | Status: AC
  Filled 2024-08-16: qty 18, 9d supply, fill #0

## 2024-08-16 NOTE — Care Management Important Message (Signed)
 Important Message  Patient Details IM Letter given. Name: Cassidy Olsen MRN: [id number] Date of Birth: 1960-09-23   Important Message Given:  Yes - Medicare IM     [name] 08/16/2024, 11:15 AM

## 2024-08-16 NOTE — Plan of Care (Signed)
" °  Problem: Fluid Volume: Goal: Hemodynamic stability will improve Outcome: Progressing   Problem: Clinical Measurements: Goal: Diagnostic test results will improve Outcome: Progressing   Problem: Clinical Measurements: Goal: Signs and symptoms of infection will decrease Outcome: Progressing   Problem: Respiratory: Goal: Ability to maintain adequate ventilation will improve Outcome: Progressing   Problem: Education: Goal: Knowledge of General Education information will improve Description: Including pain rating scale, medication(s)/side effects and non-pharmacologic comfort measures Outcome: Progressing   Problem: Education: Goal: Knowledge of General Education information will improve Description: Including pain rating scale, medication(s)/side effects and non-pharmacologic comfort measures Outcome: Progressing   Problem: Education: Goal: Knowledge of General Education information will improve Description: Including pain rating scale, medication(s)/side effects and non-pharmacologic comfort measures Outcome: Progressing   "

## 2024-08-16 NOTE — Discharge Summary (Signed)
 "  Physician Discharge Summary  Cassidy Olsen FMW:[id number] DOB: 18-Dec-1960 DOA: 08/11/2024  PCP: Perri Starleen BROCKS, MD  Admit date: 08/11/2024 Discharge date: 08/16/2024  Admitted From: Home Disposition: Home Recommendations for Outpatient Follow-up:  Outpatient follow-up with PCP as below Outpatient follow-up with orthopedic surgery Check CMP and CBC at follow-up Please follow up on the following pending results: None  Home Health: Outpatient PT. Equipment/Devices: Rolling walker.  Discharge Condition: Stable CODE STATUS: Full code   Follow-up Information     Perri Starleen BROCKS, MD. Schedule an appointment as soon as possible for a visit in 1 week(s).   Specialty: Nephrology Contact information: [address] [place] [address] [phone]                 Hospital course 64 year old F with PMH of spinal stenosis, lumbar spine fusion, chronic back pain, right TKA in 2015, migraine, TGN, interstitial cystitis, IBS, anxiety, ADHD, depression and hyperlipidemia presented to the ED with right knee pain.  Reportedly pain started after doing some exercise involving her right knee.   In ED, febrile to 103.1 F.  WBC 13.3 with left shift.  Lactic acid negative. Patient admitted for SIRS with concern for right knee infection.  UA negative.  Cultures obtained.  MRI right knee ordered.  Orthopedics consulted. Patient placed on empiric IV antibiotics and admitted.   Patient was evaluated by orthopedic surgery.  Septic arthritis felt to be highly unlikely and there was no fluid aspirated after 2 attempts.  Blood cultures NGTD.  Leukocytosis resolved.  Continues to endorse severe right leg pain.  MRI lumbar spine and lower extremity venous Doppler ordered.  Orthopedic surgery reengaged, evaluated patient and recommended MRI lumbar spine.  Patient's symptoms improved with adjustment of pain medications.  MRI lumbar spine was negative for discitis/osteomyelitis but showed new  superiorly directed right subarticular disc protrusion at L4-L5 with mildly progressive severe canal and similar severe right foraminal stenosis.  ID consulted and recommended Zyvox  on discharge.  Patient to follow-up with orthopedic surgery outpatient.  See individual problem list below for more.   Problems addressed during this hospitalization Acute right knee pain/right leg pain-initially presented with right knee pain.  She was febrile with mild leukocytosis.  History of right TKA in 2015.  Initially concerned about septic arthritis but no fluid returned with EDP attempted to tap twice. MRI of right knee showed small suprapatellar joint effusion with mild marginal synovial enhancement. Evaluated by orthopedic surgery twice.  Septic arthritis felt to be less likely.  Blood cultures NGTD.  Lower extremity venous Doppler negative for DVT.  Last fever 101.3 on 1/6.  She also have radiculopathic pain in right leg.  MRI lumbar spine as above which could explain patient's symptoms.  Overall, pain improved after adjusting pain medications. -Continue Tylenol , Celebrex , gabapentin , tramadol , oxycodone , Robaxin  and morphine   -Continue vancomycin  and cefepime  1/4>> Zyvox  1/9-1/18. -Outpatient follow-up with orthopedic surgery -Multimodal pain control.  Added low-dose Robaxin  to home regimen.   Sepsis due to right leg cellulitis: Significant fever to 103.1 with mild leukocytosis on presentation.  Still with no clear source of infection but suspect right lower extremity cellulitis history on her presentation with acute right knee pain, swelling and erythema.  Septic arthritis ruled out clinically and radiologically. - Antibiotics as above.   Essential hypertension: Normotensive for most part. - Continue home meds.   Trigeminal neuralgia of the right side of the face with migraines -Continue home amitriptyline , gabapentin  and tramadol    Anxiety  and depression: Stable -Continue amitriptyline  50 mg  nightly.   IBS with constipation: Stable - Continue Movantik , Amitiza .   GERD -Continue home meds.   Hyperlipidemia -Continue statin.    History of HSV - Continue home Valtrex .  Class I obesity Body mass index is 32.46 kg/m.           Consultations: Orthopedic surgery Infectious disease  Time spent 35  minutes  Vital signs Vitals:   08/15/24 0904 08/15/24 1435 08/15/24 2209 08/16/24 0510  BP: 127/88 128/79 (!) 144/90 (!) 132/90  Pulse: (!) 103 100 (!) 101 92  Temp: 98.5 F (36.9 C) 98.5 F (36.9 C) 98.5 F (36.9 C) 98.2 F (36.8 C)  Resp: 18 18 20 20   Height:      Weight:      SpO2: 100% 98% 99% 97%  TempSrc: Oral     BMI (Calculated):         Discharge exam  GENERAL: No apparent distress.  Nontoxic. HEENT: MMM.  Vision and hearing grossly intact.  NECK: Supple.  No apparent JVD.  RESP:  No IWOB.  Fair aeration bilaterally. CVS:  RRR. Heart sounds normal.  ABD/GI/GU: BS+. Abd soft, NTND.  MSK/EXT:  Moves extremities but limited by pain in right leg/knee.   No erythema or swelling. NEURO: AA.  Oriented appropriately.  PSYCH: Calm. Normal affect.   Discharge Instructions Discharge Instructions     Ambulatory referral to Physical Therapy   Complete by: As directed    Discharge instructions   Complete by: As directed    It has been a pleasure taking care of you!  You were hospitalized due to right knee and right leg pain.  You have been started on antibiotics due to concern for infection.  His symptoms improved.  We are discharging him on antibiotics to complete treatment course.  This very important that you complete the whole course of antibiotics regardless of improvement.  MRI of your back also showed severe spinal and foraminal stenosis.  Follow-up with orthopedic surgery.    Take care,   Increase activity slowly   Complete by: As directed       Allergies as of 08/16/2024       Reactions   Aspirin Other (See Comments), Nausea And  Vomiting, Nausea Only   Stomach upset Avoids due to IBS Due to ibs Stomach upset Due to ibs Avoids due to IBS   Ketorolac  Hives   Ketorolac  Tromethamine  Shortness Of Breath, Itching, Other (See Comments)   IM administration ONLY  REDNESS IM administration ONLY  REDNESS SWELLING OF THE THROAT  ORAL TORADOL  CAUSED EXTREME REDNESS, ITCHINESS, DIFFICULTY BREATHING, AND FACIAL SWELLING. PATIENT ALSO DIAGNOSED WITH BELL'S PALSY.   Penicillins Hives, Itching, Rash, Other (See Comments)   Did it involve swelling of the face/tongue/throat, SOB, or low BP? No Did it involve sudden or severe rash/hives, skin peeling, or any reaction on the inside of your mouth or nose? Yes Did you need to seek medical attention at a hospital or doctor's office? No When did it last happen?      2000 If all above answers are NO, may proceed with cephalosporin use. Vaginal rash Vaginal rash Did it involve swelling of the face/tongue/throat, SOB, or low BP? No Did it involve sudden or severe rash/hives, skin peeling, or any reaction on the inside of your mouth or nose? Yes Did you need to seek medical attention at a hospital or doctor's office? No When did it last happen?  2000 If all above answers are NO, may proceed with cephalosporin use. Vaginal rash Vaginal rash   Codeine Other (See Comments)   Cough meds with codeine-have withdraw symptoms when done Cough meds with codeine-have withdraw symptoms when done Cough meds with codeine-have withdraw symptoms when done   Ibuprofen Other (See Comments), Nausea And Vomiting   Stomach upset Avoids due to IBS Stomach upset Avoids due to IBS   Latex Rash, Other (See Comments)   Vaginal irritation Vaginal irritation Exacerbates genital herpes Exacerbates genital herpes   Omeprazole Magnesium Other (See Comments)   Lack of therapeutic effect Lack of therapeutic effect   Pantoprazole  Sodium Other (See Comments)   Lack of therapeutic effect Lack of  therapeutic effect   Penicillamine Rash   Pregabalin Other (See Comments)   Weight gain Weight gain   Phenazopyridine     Other Reaction(s): angioedema   Pyridium  [phenazopyridine  Hcl] Swelling   Lip swelling and burning        Medication List     TAKE these medications    ACETAMINOPHEN  PO Take 1,300 mg by mouth 2 (two) times daily.   AeroChamber MV inhaler Use as instructed   Ajovy  225 MG/1.5ML Soaj Generic drug: Fremanezumab -vfrm Inject 225 mg into the skin every 30 (thirty) days.   albuterol  108 (90 Base) MCG/ACT inhaler Commonly known as: VENTOLIN  HFA Inhale 2 puffs into the lungs every 6 (six) hours as needed for wheezing or shortness of breath.   amitriptyline  50 MG tablet Commonly known as: ELAVIL  TAKE 1 TABLET BY MOUTH EVERYDAY AT BEDTIME   amLODipine  2.5 MG tablet Commonly known as: NORVASC  Take 1 tablet by mouth daily.   atorvastatin  20 MG tablet Commonly known as: LIPITOR Take 1 tablet (20 mg total) by mouth daily.   Botox  200 units injection Generic drug: botulinum toxin Type A  INJECT 155 UNITS INTRAMUSCULARLY EVERY 12 WEEKS (GIVEN AT MD  OFFICE, DISCARD UNUSED)   celecoxib  200 MG capsule Commonly known as: CELEBREX  Take 200 mg by mouth 2 (two) times daily.   cetirizine 10 MG tablet Commonly known as: ZYRTEC Take 10 mg by mouth daily.   dexlansoprazole  60 MG capsule Commonly known as: DEXILANT  Take 1 capsule (60 mg total) by mouth daily.   dicyclomine  10 MG capsule Commonly known as: BENTYL  Take 2 capsules (20 mg total) by mouth in the morning and at bedtime. PRN   ELDERBERRY PO ELDERBERRY IMMUNE SUPPORT   fluticasone  50 MCG/ACT nasal spray Commonly known as: FLONASE  Place 2 sprays into both nostrils daily.   fluticasone -salmeterol 250-50 MCG/ACT Aepb Commonly known as: Wixela Inhub Inhale 1 puff into the lungs in the morning and at bedtime.   furosemide  20 MG tablet Commonly known as: Lasix  Take 1 tablet (20 mg total) by mouth  as needed.   gabapentin  300 MG capsule Commonly known as: NEURONTIN  Take 3 capsules (900 mg total) by mouth 3 (three) times daily.   glycopyrrolate  2 MG tablet Commonly known as: ROBINUL  Take 1 tablet (2 mg total) by mouth 2 (two) times daily.   lidocaine  4 % Place 1 patch onto the skin as needed.   linezolid  600 MG tablet Commonly known as: ZYVOX  Take 1 tablet (600 mg total) by mouth 2 (two) times daily for 9 days.   lisinopril  20 MG tablet Commonly known as: ZESTRIL  Take 20 mg by mouth 2 (two) times daily.   lubiprostone  24 MCG capsule Commonly known as: AMITIZA  TAKE 1 CAPSULE (24 MCG TOTAL) BY MOUTH 2 (TWO) TIMES DAILY  WITH A MEAL.   montelukast  10 MG tablet Commonly known as: SINGULAIR  TAKE 1 TABLET BY MOUTH EVERYDAY AT BEDTIME   Movantik  25 MG Tabs tablet Generic drug: naloxegol  oxalate TAKE 1 TABLET (25 MG TOTAL) BY MOUTH DAILY.   Omega III EPA+DHA 1000 MG Caps Take by mouth.   ondansetron  4 MG tablet Commonly known as: ZOFRAN  Take 4 mg by mouth every 8 (eight) hours as needed.   Oregano Oil Immune Support 50-25 MG Caps Generic drug: Oregano Oil-Flaxseed Oil   sucralfate  1 g tablet Commonly known as: Carafate  Take 1 tablet (1 g total) by mouth 4 (four) times daily -  with meals and at bedtime.   Suzetrigine  50 MG Tabs Take 50 mg by mouth in the morning and at bedtime.   timolol  0.5 % ophthalmic solution Commonly known as: TIMOPTIC  1-2 drops eyes at onset and 30 minutes after if needed. Can repeat in 2 hours for headache/migraine.   traMADol  50 MG tablet Commonly known as: ULTRAM  Take 100 mg by mouth 3 (three) times daily. 2x daily   valACYclovir  500 MG tablet Commonly known as: VALTREX  Take 500 mg by mouth 2 (two) times daily as needed (outbreaks).   Vitamin D  (Cholecalciferol ) 25 MCG (1000 UT) Caps Take 1 capsule by mouth daily.               Durable Medical Equipment  (From admission, onward)           Start     Ordered    08/15/24 0803  For home use only DME Walker rolling  Once       Question Answer Comment  Walker: With 5 Inch Wheels   Patient needs a walker to treat with the following condition Right leg pain      08/15/24 0802             Procedures/Studies: 1/4-unsuccessful right knee arthrocentesis in ED.   MR Lumbar Spine W Wo Contrast Result Date: 08/15/2024 EXAM: MRI LUMBAR SPINE 08/15/2024 01:22:52 PM TECHNIQUE: Multiplanar multisequence MRI of the lumbar spine was performed with and without the administration of intravenous contrast. CONTRAST: 8mL of Gadavist  COMPARISON: MR Lumbar Spine 11/04/2022 CLINICAL HISTORY: Lumbar radiculopathy, infection suspected, positive xray/CT. FINDINGS: BONES AND ALIGNMENT: Transitional anatomy with lumbarization of the S1 vertebral body. Same numbering as on the prior MRI. L5-S1 PLIF. Normal alignment. Normal vertebral body heights. No evidence of acute fracture. SPINAL CORD: The conus terminates normally. SOFT TISSUES: No paraspinal mass. L1-L2: No disc herniation. No spinal canal stenosis or neural foraminal narrowing. L2-L3: No disc herniation. No spinal canal stenosis or neural foraminal narrowing. L3-L4: Disc bulge, ligamentum flavum thickening and facet arthropathy. Similar moderate canal stenosis. Similar moderate bilateral foraminal stenosis. L4-L5: Disc bulge and moderate facet arthropathy with similar moderate to severe canal stenosis. New superiorly directed right subarticular disc protrusion. Similar severe right and mild to moderate left foraminal stenosis. L5-S1: Prior posterior decompression. No significant canal stenosis. Mild bilateral foraminal stenosis. IMPRESSION: 1. No specific evidence of discitis/osteomyelitis. 2. At L4-L5, new superiorly directed right subarticular disc protrusion and mildly progressive severe canal stenosis. Similar severe right foraminal stenosis. 3. At L3-L4, similar moderate canal and bilateral foraminal stenosis. 4. L5-S1  posterior decompression and PLIF without significant stenosis. 5. Transitional lumbosacral anatomy with lumbarized S1. Electronically signed by: Gilmore Molt MD MD 08/15/2024 11:01 PM EST RP Workstation: [id number]   VAS US  LOWER EXTREMITY VENOUS (DVT) Result Date: 08/14/2024  Lower Venous DVT Study Patient Name:  [facility]  [facility]  Date of Exam:   08/14/2024 Medical Rec #: [id number]        Accession #:    [id number] Date of Birth: 30-Apr-1961        Patient Gender: F Patient Age:   34 years Exam Location:  [facility] Procedure:      VAS US  LOWER EXTREMITY VENOUS (DVT) Referring Phys: MIGNON Kalonji Zurawski --------------------------------------------------------------------------------  Indications: Pain.  Risk Factors: None identified. Comparison Study: No prior studies. Performing Technologist: Cordella Collet RVT  Examination Guidelines: A complete evaluation includes B-mode imaging, spectral Doppler, color Doppler, and power Doppler as needed of all accessible portions of each vessel. Bilateral testing is considered an integral part of a complete examination. Limited examinations for reoccurring indications may be performed as noted. The reflux portion of the exam is performed with the patient in reverse Trendelenburg.  +---------+---------------+---------+-----------+----------+--------------+ RIGHT    CompressibilityPhasicitySpontaneityPropertiesThrombus Aging +---------+---------------+---------+-----------+----------+--------------+ CFV      Full           Yes      Yes                                 +---------+---------------+---------+-----------+----------+--------------+ SFJ      Full                                                        +---------+---------------+---------+-----------+----------+--------------+ FV Prox  Full                                                        +---------+---------------+---------+-----------+----------+--------------+ FV Mid   Full                                                         +---------+---------------+---------+-----------+----------+--------------+ FV DistalFull                                                        +---------+---------------+---------+-----------+----------+--------------+ PFV      Full                                                        +---------+---------------+---------+-----------+----------+--------------+ POP      Full           Yes      Yes                                 +---------+---------------+---------+-----------+----------+--------------+ PTV      Full                                                        +---------+---------------+---------+-----------+----------+--------------+  PERO     Full                                                        +---------+---------------+---------+-----------+----------+--------------+   +---------+---------------+---------+-----------+----------+--------------+ LEFT     CompressibilityPhasicitySpontaneityPropertiesThrombus Aging +---------+---------------+---------+-----------+----------+--------------+ CFV      Full           Yes      Yes                                 +---------+---------------+---------+-----------+----------+--------------+ SFJ      Full                                                        +---------+---------------+---------+-----------+----------+--------------+ FV Prox  Full                                                        +---------+---------------+---------+-----------+----------+--------------+ FV Mid   Full                                                        +---------+---------------+---------+-----------+----------+--------------+ FV DistalFull                                                        +---------+---------------+---------+-----------+----------+--------------+ PFV      Full                                                         +---------+---------------+---------+-----------+----------+--------------+ POP      Full           Yes      Yes                                 +---------+---------------+---------+-----------+----------+--------------+ PTV      Full                                                        +---------+---------------+---------+-----------+----------+--------------+ PERO     Full                                                        +---------+---------------+---------+-----------+----------+--------------+  Summary: RIGHT: - There is no evidence of deep vein thrombosis in the lower extremity.  - No cystic structure found in the popliteal fossa.  LEFT: - There is no evidence of deep vein thrombosis in the lower extremity.  - No cystic structure found in the popliteal fossa.  *See table(s) above for measurements and observations. Electronically signed by Gaile New MD on 08/14/2024 at 7:07:20 PM.    Final    DG HIP UNILAT WITH PELVIS 2-3 VIEWS LEFT Result Date: 08/13/2024 EXAM: 2 OR MORE VIEW(S) XRAY OF THE BILATERAL HIP 08/13/2024 01:10:00 PM COMPARISON: None available. CLINICAL HISTORY: Pain. FINDINGS: BONES AND JOINTS: No acute fracture. No malalignment. Mild bilateral hip osteoarthritis. SOFT TISSUES: Neurostimulator with generator in right lower quadrant noted. LUMBAR SPINE: Lower lumbar fusion noted. IMPRESSION: 1. No acute findings. 2. Mild bilateral hip osteoarthritis. Electronically signed by: Greig Pique MD 08/13/2024 08:36 PM EST RP Workstation: [id number]   MR KNEE RIGHT W WO CONTRAST Result Date: 08/13/2024 EXAM: MRI of the right Knee With and Without Contrast. 08/13/2024 09:16:07 AM TECHNIQUE: Multiplanar multisequence MRI of the right knee was performed without and with intravenous contrast. 8 mL of gadobutrol  (GADAVIST ) 1 MMOL/ML injection was administered. Metal artifact related to the knee prosthesis obscures surrounding findings. Metal artifact reduction sequences  were employed. There is substantial field heterogeneity despite the use of metal artifact reduction sequencing, which lowers diagnostic sensitivity and specificity. COMPARISON: Radiographs 08/11/2024. CLINICAL HISTORY: Chronic right knee pain, total knee prosthesis, clinical concern is raised for septic arthritis. FINDINGS: MEDIAL MENISCUS: Surgically absent status post total knee arthroplasty, with much of the knee joint obscured by metal artifact. LATERAL MENISCUS: Surgically absent status post total knee arthroplasty, with much of the knee joint obscured by metal artifact. ANTERIOR CRUCIATE LIGAMENT: Presumably surgically absent status post total knee arthroplasty, with much of the knee joint obscured by metal artifact. POSTERIOR CRUCIATE LIGAMENT: Presumably surgically absent status post total knee arthroplasty, with much of the knee joint obscured by metal artifact. EXTENSOR MECHANISM: Intact quadriceps and patellar tendons with speckled metal artifact anteriorly. Intact patellar retinacula. LATERAL COLLATERAL LIGAMENT COMPLEX: Intact IT band and biceps femoris tendon. Popliteal region grossly unremarkable. MEDIAL COLLATERAL LIGAMENT COMPLEX: The visualized portion of the medial collateral ligament appears intact. KNEE JOINT: A small knee joint effusion is thought to be present in the suprapatellar bursa with mild marginal synovial enhancement. No specific indicator of infection although arthrocentesis would be warranted if there is a high clinical index of suspicion. Much of the knee joint is obscured by metal artifact. Speckled metal artifact also noted in Hoffa's fat pad without compelling evidence of gas in the joint. BONE MARROW: No acute fracture or aggressive marrow replacing lesion. No specific or compelling indicators of active osteomyelitis. SOFT TISSUE: No focal abnormality. Mild vastus medialis atrophy. IMPRESSION: 1. No specific MRI indicator of septic arthritis or active osteomyelitis; small  suprapatellar joint effusion with mild marginal synovial enhancement, and arthrocentesis is warranted if there is a high clinical index of suspicion. 2. 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 3. Substantial metal artifact and field heterogeneity from the knee prosthesis limit evaluation. Electronically signed by: Ryan Salvage MD 08/13/2024 09:32 AM EST RP Workstation: [id number]   DG Chest Portable 1 View Result Date: 08/11/2024 CLINICAL DATA:  Fever EXAM: PORTABLE CHEST 1 VIEW COMPARISON:  June 07, 2024 FINDINGS: The heart size and mediastinal contours are within normal limits. Both lungs are clear. The visualized skeletal structures are  unremarkable. IMPRESSION: No active disease. Electronically Signed   By: Lynwood Landy Raddle M.D.   On: 08/11/2024 10:54   DG Knee Complete 4 Views Right Result Date: 08/11/2024 EXAM: 4 OR MORE VIEW(S) XRAY OF THE RIGHT KNEE 08/11/2024 08:43:40 AM COMPARISON: None available. CLINICAL HISTORY: pain pain FINDINGS: BONES AND JOINTS: Status post right total knee arthroplasty. No acute fracture. No malalignment. There is a mild suprapatellar bursal effusion. SOFT TISSUES: The soft tissues are unremarkable. IMPRESSION: 1. Status post right total knee arthroplasty. 2. Mild suprapatellar bursal effusion. Electronically signed by: Evalene Coho MD 08/11/2024 08:48 AM EST RP Workstation: [id number]       The results of significant diagnostics from this hospitalization (including imaging, microbiology, ancillary and laboratory) are listed below for reference.     Microbiology: Recent Results (from the past 240 hours)  Blood culture (routine x 2)     Status: None   Collection Time: 08/11/24  8:55 AM   Specimen: BLOOD LEFT HAND  Result Value Ref Range Status   Specimen Description   Final    BLOOD LEFT HAND Performed at [facility], [address] [place], [place] [address]     Special Requests   Final    BOTTLES DRAWN AEROBIC AND ANAEROBIC Blood Culture adequate volume Performed at [facility], [address] [place], [place] [address]    Culture   Final    NO GROWTH 5 DAYS Performed at [facility], [address] [place], [place] [address]    Report Status 08/16/2024 FINAL  Final  Blood culture (routine x 2)     Status: None   Collection Time: 08/11/24  8:55 AM   Specimen: BLOOD  Result Value Ref Range Status   Specimen Description   Final    BLOOD RIGHT ANTECUBITAL Performed at [facility], [address] [place], [place] [address]    Special Requests   Final    BOTTLES DRAWN AEROBIC AND ANAEROBIC Blood Culture adequate volume Performed at [facility], [address], [place], [place] [address]    Culture   Final    NO GROWTH 5 DAYS Performed at [facility], [address], [place], [place] [address]    Report Status 08/16/2024 FINAL  Final  Resp panel by RT-PCR (RSV, Flu A&B, Covid) Peripheral     Status: None   Collection Time: 08/11/24  8:55 AM   Specimen: Peripheral; Nasal Swab  Result Value Ref Range Status   SARS Coronavirus 2 by RT PCR NEGATIVE NEGATIVE Final    Comment: (NOTE) SARS-CoV-2 target nucleic acids are NOT DETECTED.  The SARS-CoV-2 RNA is generally detectable in upper respiratory specimens during the acute phase of infection. The lowest concentration of SARS-CoV-2 viral copies this assay can detect is 138 copies/mL. A negative result does not preclude SARS-Cov-2 infection and should not be used as the sole basis for treatment or other patient management decisions. A negative result may occur with  improper specimen collection/handling, submission of specimen other than nasopharyngeal swab, presence of viral mutation(s) within the areas targeted by this assay, and inadequate number of viral copies(<138 copies/mL). A negative result must be combined  with clinical observations, patient history, and epidemiological information. The expected result is Negative.  Fact Sheet for Patients:  bloggercourse.com  Fact Sheet for Healthcare Providers:  seriousbroker.it  This test is no t yet approved or cleared by the [place]  FDA and  has been authorized for detection and/or diagnosis  of SARS-CoV-2 by FDA under an Emergency Use Authorization (EUA). This EUA will remain  in effect (meaning this test can be used) for the duration of the COVID-19 declaration under Section 564(b)(1) of the Act, 21 U.S.C.section 360bbb-3(b)(1), unless the authorization is terminated  or revoked sooner.       Influenza A by PCR NEGATIVE NEGATIVE Final   Influenza B by PCR NEGATIVE NEGATIVE Final    Comment: (NOTE) The Xpert Xpress SARS-CoV-2/FLU/RSV plus assay is intended as an aid in the diagnosis of influenza from Nasopharyngeal swab specimens and should not be used as a sole basis for treatment. Nasal washings and aspirates are unacceptable for Xpert Xpress SARS-CoV-2/FLU/RSV testing.  Fact Sheet for Patients: bloggercourse.com  Fact Sheet for Healthcare Providers: seriousbroker.it  This test is not yet approved or cleared by the [place]  FDA and has been authorized for detection and/or diagnosis of SARS-CoV-2 by FDA under an Emergency Use Authorization (EUA). This EUA will remain in effect (meaning this test can be used) for the duration of the COVID-19 declaration under Section 564(b)(1) of the Act, 21 U.S.C. section 360bbb-3(b)(1), unless the authorization is terminated or revoked.     Resp Syncytial Virus by PCR NEGATIVE NEGATIVE Final    Comment: (NOTE) Fact Sheet for Patients: bloggercourse.com  Fact Sheet for Healthcare Providers: seriousbroker.it  This test is not yet approved  or cleared by the [place]  FDA and has been authorized for detection and/or diagnosis of SARS-CoV-2 by FDA under an Emergency Use Authorization (EUA). This EUA will remain in effect (meaning this test can be used) for the duration of the COVID-19 declaration under Section 564(b)(1) of the Act, 21 U.S.C. section 360bbb-3(b)(1), unless the authorization is terminated or revoked.  Performed at [facility], [address] [place], [place] [address]   Urine Culture     Status: Abnormal   Collection Time: 08/11/24 11:58 AM   Specimen: Urine, Clean Catch  Result Value Ref Range Status   Specimen Description   Final    URINE, CLEAN CATCH Performed at [facility], [address], [place], [place] [address]    Special Requests   Final    NONE Performed at [facility], [address] [place], [place] [address]    Culture MULTIPLE SPECIES PRESENT, SUGGEST RECOLLECTION (A)  Final   Report Status 08/12/2024 FINAL  Final     Labs:  CBC: Recent Labs  Lab 08/11/24 0855 08/12/24 0239 08/13/24 0534 08/14/24 0600 08/15/24 0603  WBC 13.3* 8.1 7.0 6.5 5.8  NEUTROABS 9.7*  --  4.4  --   --   HGB 13.2 11.3* 11.4* 10.8* 10.3*  HCT 38.4 33.3* 34.7* 32.2* 31.6*  MCV 94.8 96.2 98.6 96.4 98.1  PLT 349 310 312 353 374   BMP &GFR Recent Labs  Lab 08/11/24 0855 08/12/24 0239 08/13/24 0534 08/14/24 0600 08/15/24 0603 08/16/24 0614  NA 138 137 139 139 141  --   K 3.7 3.7 3.6 3.5 4.0  --   CL 100 105 105 104 107  --   CO2 27 24 23 23 25   --   GLUCOSE 94 132* 90 117* 106*  --   BUN 9 10 9  7* 8  --   CREATININE 0.83 0.88 0.82 0.81 0.76 0.77  CALCIUM  9.9 8.9 9.7 9.3 9.3  --   MG  --   --   --   --  2.0  --    Estimated Creatinine Clearance: 74.5 mL/min (by  C-G formula based on SCr of 0.77 mg/dL). Liver & Pancreas: Recent Labs  Lab 08/11/24 0855 08/15/24 0603  AST 60* 32  ALT 45* 75*  ALKPHOS 88 108  BILITOT 0.7  0.3  PROT 7.3 6.2*  ALBUMIN 4.1 3.3*   No results for input(s): LIPASE, AMYLASE in the last 168 hours. No results for input(s): AMMONIA in the last 168 hours. Diabetic: No results for input(s): HGBA1C in the last 72 hours. No results for input(s): GLUCAP in the last 168 hours. Cardiac Enzymes: Recent Labs  Lab 08/15/24 0603  CKTOTAL 141   Recent Labs    07/30/24 1416  PROBNP 6.0   Coagulation Profile: No results for input(s): INR, PROTIME in the last 168 hours. Thyroid  Function Tests: No results for input(s): TSH, T4TOTAL, FREET4, T3FREE, THYROIDAB in the last 72 hours. Lipid Profile: No results for input(s): CHOL, HDL, LDLCALC, TRIG, CHOLHDL, LDLDIRECT in the last 72 hours. Anemia Panel: No results for input(s): VITAMINB12, FOLATE, FERRITIN, TIBC, IRON, RETICCTPCT in the last 72 hours. Urine analysis:    Component Value Date/Time   COLORURINE YELLOW 08/11/2024 1157   APPEARANCEUR CLEAR 08/11/2024 1157   LABSPEC 1.010 08/11/2024 1157   PHURINE 8.0 08/11/2024 1157   GLUCOSEU NEGATIVE 08/11/2024 1157   HGBUR NEGATIVE 08/11/2024 1157   BILIRUBINUR NEGATIVE 08/11/2024 1157   BILIRUBINUR negative 07/22/2019 1416   KETONESUR NEGATIVE 08/11/2024 1157   PROTEINUR NEGATIVE 08/11/2024 1157   UROBILINOGEN 0.2 05/04/2022 2037   NITRITE NEGATIVE 08/11/2024 1157   LEUKOCYTESUR NEGATIVE 08/11/2024 1157   Sepsis Labs: Invalid input(s): PROCALCITONIN, LACTICIDVEN   SIGNED:  Keziah Drotar T Asencion Guisinger, MD  [facility] 08/16/2024, 10:26 AM   "

## 2024-08-16 NOTE — Telephone Encounter (Signed)
 Pharmacy Patient Advocate Encounter  Insurance verification completed.    The patient is insured through [employer]. Patient has Medicare and is not eligible for a copay card, but may be able to apply for patient assistance or Medicare RX Payment Plan (Patient Must reach out to their plan, if eligible for payment plan), if available.    Ran test claim for Linezolid  600mg  tablet and the current 8 day co-pay is $5.   This test claim was processed through [employer]- copay amounts may vary at other pharmacies due to [employer], or as the patient moves through the different stages of their insurance plan.

## 2024-08-16 NOTE — Progress Notes (Signed)
 "                                                            [facility] Infectious Diseases Follow Up Note  Patient Identification: Patient Name: Cassidy Olsen Service MRN: [id number] Admit Date: 08/11/2024  8:04 AM Age: 64 y.o.Today's Date: 08/16/2024  Reason for Visit: Right knee cellulitis  Principal Problem:   SIRS (systemic inflammatory response syndrome) (HCC) Active Problems:   Trigeminal neuralgia of right side of face   Occipital neuralgia   Essential hypertension   Irritable bowel syndrome with constipation   Hyperlipidemia   Depression   Obesity (BMI 30-39.9)   Gastroesophageal reflux disease   Acute pain of right knee  Antibiotics:  Vancomycin  1/4- Cefepime  1/4-1/8, ceftriaxone  1/8- Metronidazole  1/4-1/6 Valacyclovir  1/6-     Lines/Hardware: RT TKA  Interval Events: Afebrile No new labs today  Assessment 64 year old female with prior history as below including right TKA, HTN, HLD, Arthritis, Interstitial cystitis, IBS, ADHD, depression migraine, trigeminal and occipital neuralgia who presented to the ED with fevers, sudden onset right knee pain and swelling for 2 days. She reports following a provider for lumbar stenosis for back injection who recommended some exercises last week with sudden onset rt knee pain and swelling   # Fevers- resolved  - unclear source ? #2 - Unlikely to be rt knee septic arthritis   # Reactive small suprapatellar prosthetic knee joint effusion - in the setting of over use  - improving with better pain control   # Transaminitis  - not significantly elevated    # Chronic low back pain w radiculopathy  - Ortho recommending to get MRLI L spine to r/o epidural abscess  - MRI L spine with no epidural abscess, discitis/osteomyelitis - Follow-up with Ortho for lumbar spinal stenosis  Recommendations - Will change vancomycin  and ceftriaxone  to p.o. linezolid  to complete 2 weeks course.  EOT 1/18 - Follow-up with orthopedics/neurosurgery for lumbar  spinal stenosis - Patient has a fu arranged with me @ [facility] on1/23 at 9: 45 am  - Universal/standard isolation precaution D/W primary team ID will sign off  Rest of the management as per the primary team. Thank you for the consult. Please page with pertinent questions or concerns.  ______________________________________________________________________ Subjective patient seen and examined at the bedside.  Right knee pain and swelling better.  No new concerns.  Vitals BP (!) 132/90 (BP Location: Right Arm)   Pulse 92   Temp 98.2 F (36.8 C)   Resp 20   Ht 5' 3 (1.6 m)   Wt 83.1 kg   SpO2 97%   BMI 32.46 kg/m     Physical Exam Constitutional: Awake, alert and oriented    Comments: Oral mucosa moist  Cardiovascular:     Rate and Rhythm: Normal rate     Heart sounds:   Pulmonary:     Effort: Pulmonary effort is normal.     Comments:   Abdominal:     Palpations: Abdomen is nondistended    Tenderness:   Musculoskeletal:        General: No swelling or tenderness in peripheral joints Right knee with mild swelling, likely reactive with no signs of septic arthritis  Skin:    Comments: No rashes  Neurological:     General: Awake, alert, nonfocal  exam  Psychiatric:        Mood and Affect: Mood normal.   Pertinent Microbiology Results for orders placed or performed during the hospital encounter of 08/11/24  Blood culture (routine x 2)     Status: None (Preliminary result)   Collection Time: 08/11/24  8:55 AM   Specimen: BLOOD LEFT HAND  Result Value Ref Range Status   Specimen Description   Final    BLOOD LEFT HAND Performed at [facility], [address] [place], [place] [address]    Special Requests   Final    BOTTLES DRAWN AEROBIC AND ANAEROBIC Blood Culture adequate volume Performed at [facility], [address], [place], [place] [address]    Culture   Final    NO GROWTH 4 DAYS Performed at [facility], [address], [place], [place] [address]    Report Status PENDING  Incomplete  Blood culture (routine x 2)     Status: None (Preliminary result)   Collection Time: 08/11/24  8:55 AM   Specimen: BLOOD  Result Value Ref Range Status   Specimen Description   Final    BLOOD RIGHT ANTECUBITAL Performed at [facility], [address] [place], [place] [address]    Special Requests   Final    BOTTLES DRAWN AEROBIC AND ANAEROBIC Blood Culture adequate volume Performed at [facility], [address] [place], [place] [address]    Culture   Final    NO GROWTH 4 DAYS Performed at [facility], [address], [place], [place] [address]    Report Status PENDING  Incomplete  Resp panel by RT-PCR (RSV, Flu A&B, Covid) Peripheral     Status: None   Collection Time: 08/11/24  8:55 AM   Specimen: Peripheral; Nasal Swab  Result Value Ref Range Status   SARS Coronavirus 2 by RT PCR NEGATIVE NEGATIVE Final    Comment: (NOTE) SARS-CoV-2 target nucleic acids are NOT DETECTED.  The SARS-CoV-2 RNA is generally detectable in upper respiratory specimens during the acute phase of infection. The lowest concentration of SARS-CoV-2 viral copies this assay can detect is 138 copies/mL. A negative result does not preclude SARS-Cov-2 infection and should not be used as the sole basis for treatment or other patient management decisions. A negative result may occur with  improper specimen collection/handling, submission of specimen other than nasopharyngeal swab, presence of viral mutation(s) within the areas targeted by this assay, and inadequate number of viral copies(<138 copies/mL). A negative result must be combined with clinical observations, patient history, and epidemiological information. The expected result is Negative.  Fact Sheet for Patients:  bloggercourse.com  Fact Sheet for Healthcare Providers:   seriousbroker.it  This test is no t yet approved or cleared by the [place]  FDA and  has been authorized for detection and/or diagnosis of SARS-CoV-2 by FDA under an Emergency Use Authorization (EUA). This EUA will remain  in effect (meaning this test can be used) for the duration of the COVID-19 declaration under Section 564(b)(1) of the Act, 21 U.S.C.section 360bbb-3(b)(1), unless the authorization is terminated  or revoked sooner.       Influenza A by PCR NEGATIVE NEGATIVE Final   Influenza B by PCR NEGATIVE NEGATIVE Final    Comment: (NOTE) The Xpert Xpress SARS-CoV-2/FLU/RSV plus assay is intended as an aid in the diagnosis of influenza from Nasopharyngeal swab specimens and should not be used as a sole basis for treatment. Nasal washings and aspirates  are unacceptable for Xpert Xpress SARS-CoV-2/FLU/RSV testing.  Fact Sheet for Patients: bloggercourse.com  Fact Sheet for Healthcare Providers: seriousbroker.it  This test is not yet approved or cleared by the [place]  FDA and has been authorized for detection and/or diagnosis of SARS-CoV-2 by FDA under an Emergency Use Authorization (EUA). This EUA will remain in effect (meaning this test can be used) for the duration of the COVID-19 declaration under Section 564(b)(1) of the Act, 21 U.S.C. section 360bbb-3(b)(1), unless the authorization is terminated or revoked.     Resp Syncytial Virus by PCR NEGATIVE NEGATIVE Final    Comment: (NOTE) Fact Sheet for Patients: bloggercourse.com  Fact Sheet for Healthcare Providers: seriousbroker.it  This test is not yet approved or cleared by the [place]  FDA and has been authorized for detection and/or diagnosis of SARS-CoV-2 by FDA under an Emergency Use Authorization (EUA). This EUA will remain in effect (meaning this test can be used) for  the duration of the COVID-19 declaration under Section 564(b)(1) of the Act, 21 U.S.C. section 360bbb-3(b)(1), unless the authorization is terminated or revoked.  Performed at [facility], [address] [place], [place] [address]   Urine Culture     Status: Abnormal   Collection Time: 08/11/24 11:58 AM   Specimen: Urine, Clean Catch  Result Value Ref Range Status   Specimen Description   Final    URINE, CLEAN CATCH Performed at [facility], [address], [place], [place] [address]    Special Requests   Final    NONE Performed at [facility], [address], [place], [place] [address]    Culture MULTIPLE SPECIES PRESENT, SUGGEST RECOLLECTION (A)  Final   Report Status 08/12/2024 FINAL  Final   Pertinent Lab.    Latest Ref Rng & Units 08/15/2024    6:03 AM 08/14/2024    6:00 AM 08/13/2024    5:34 AM  CBC  WBC 4.0 - 10.5 K/uL 5.8  6.5  7.0   Hemoglobin 12.0 - 15.0 g/dL 89.6  89.1  88.5   Hematocrit 36.0 - 46.0 % 31.6  32.2  34.7   Platelets 150 - 400 K/uL 374  353  312       Latest Ref Rng & Units 08/16/2024    6:14 AM 08/15/2024    6:03 AM 08/14/2024    6:00 AM  CMP  Glucose 70 - 99 mg/dL  893  882   BUN 8 - 23 mg/dL  8  7   Creatinine 9.55 - 1.00 mg/dL 9.22  9.23  9.18   Sodium 135 - 145 mmol/L  141  139   Potassium 3.5 - 5.1 mmol/L  4.0  3.5   Chloride 98 - 111 mmol/L  107  104   CO2 22 - 32 mmol/L  25  23   Calcium  8.9 - 10.3 mg/dL  9.3  9.3   Total Protein 6.5 - 8.1 g/dL  6.2    Total Bilirubin 0.0 - 1.2 mg/dL  0.3    Alkaline Phos 38 - 126 U/L  108    AST 15 - 41 U/L  32    ALT 0 - 44 U/L  75       Pertinent Imaging today Plain films and CT images have been personally visualized and interpreted; radiology reports have been reviewed. Decision making incorporated into the Impression /   MR Lumbar Spine W Wo Contrast Result Date: 08/15/2024 EXAM: MRI LUMBAR SPINE 08/15/2024 01:22:52 PM TECHNIQUE:  Multiplanar  multisequence MRI of the lumbar spine was performed with and without the administration of intravenous contrast. CONTRAST: 8mL of Gadavist  COMPARISON: MR Lumbar Spine 11/04/2022 CLINICAL HISTORY: Lumbar radiculopathy, infection suspected, positive xray/CT. FINDINGS: BONES AND ALIGNMENT: Transitional anatomy with lumbarization of the S1 vertebral body. Same numbering as on the prior MRI. L5-S1 PLIF. Normal alignment. Normal vertebral body heights. No evidence of acute fracture. SPINAL CORD: The conus terminates normally. SOFT TISSUES: No paraspinal mass. L1-L2: No disc herniation. No spinal canal stenosis or neural foraminal narrowing. L2-L3: No disc herniation. No spinal canal stenosis or neural foraminal narrowing. L3-L4: Disc bulge, ligamentum flavum thickening and facet arthropathy. Similar moderate canal stenosis. Similar moderate bilateral foraminal stenosis. L4-L5: Disc bulge and moderate facet arthropathy with similar moderate to severe canal stenosis. New superiorly directed right subarticular disc protrusion. Similar severe right and mild to moderate left foraminal stenosis. L5-S1: Prior posterior decompression. No significant canal stenosis. Mild bilateral foraminal stenosis. IMPRESSION: 1. No specific evidence of discitis/osteomyelitis. 2. At L4-L5, new superiorly directed right subarticular disc protrusion and mildly progressive severe canal stenosis. Similar severe right foraminal stenosis. 3. At L3-L4, similar moderate canal and bilateral foraminal stenosis. 4. L5-S1 posterior decompression and PLIF without significant stenosis. 5. Transitional lumbosacral anatomy with lumbarized S1. Electronically signed by: Gilmore Molt MD MD 08/15/2024 11:01 PM EST RP Workstation: [id number]   VAS US  LOWER EXTREMITY VENOUS (DVT) Result Date: 08/14/2024  Lower Venous DVT Study Patient Name:  Cassidy Olsen  Date of Exam:   08/14/2024 Medical Rec #: [id number]        Accession #:    [id number] Date of  Birth: 11/06/1960        Patient Gender: F Patient Age:   62 years Exam [facility]:  [facility] Procedure:      VAS US  LOWER EXTREMITY VENOUS (DVT) Referring Phys: MIGNON GONFA --------------------------------------------------------------------------------  Indications: Pain.  Risk Factors: None identified. Comparison Study: No prior studies. Performing Technologist: Cordella Collet RVT  Examination Guidelines: A complete evaluation includes B-mode imaging, spectral Doppler, color Doppler, and power Doppler as needed of all accessible portions of each vessel. Bilateral testing is considered an integral part of a complete examination. Limited examinations for reoccurring indications may be performed as noted. The reflux portion of the exam is performed with the patient in reverse Trendelenburg.  +---------+---------------+---------+-----------+----------+--------------+ RIGHT    CompressibilityPhasicitySpontaneityPropertiesThrombus Aging +---------+---------------+---------+-----------+----------+--------------+ CFV      Full           Yes      Yes                                 +---------+---------------+---------+-----------+----------+--------------+ SFJ      Full                                                        +---------+---------------+---------+-----------+----------+--------------+ FV Prox  Full                                                        +---------+---------------+---------+-----------+----------+--------------+ FV Mid   Full                                                        +---------+---------------+---------+-----------+----------+--------------+  FV DistalFull                                                        +---------+---------------+---------+-----------+----------+--------------+ PFV      Full                                                         +---------+---------------+---------+-----------+----------+--------------+ POP      Full           Yes      Yes                                 +---------+---------------+---------+-----------+----------+--------------+ PTV      Full                                                        +---------+---------------+---------+-----------+----------+--------------+ PERO     Full                                                        +---------+---------------+---------+-----------+----------+--------------+   +---------+---------------+---------+-----------+----------+--------------+ LEFT     CompressibilityPhasicitySpontaneityPropertiesThrombus Aging +---------+---------------+---------+-----------+----------+--------------+ CFV      Full           Yes      Yes                                 +---------+---------------+---------+-----------+----------+--------------+ SFJ      Full                                                        +---------+---------------+---------+-----------+----------+--------------+ FV Prox  Full                                                        +---------+---------------+---------+-----------+----------+--------------+ FV Mid   Full                                                        +---------+---------------+---------+-----------+----------+--------------+ FV DistalFull                                                        +---------+---------------+---------+-----------+----------+--------------+  PFV      Full                                                        +---------+---------------+---------+-----------+----------+--------------+ POP      Full           Yes      Yes                                 +---------+---------------+---------+-----------+----------+--------------+ PTV      Full                                                         +---------+---------------+---------+-----------+----------+--------------+ PERO     Full                                                        +---------+---------------+---------+-----------+----------+--------------+     Summary: RIGHT: - There is no evidence of deep vein thrombosis in the lower extremity.  - No cystic structure found in the popliteal fossa.  LEFT: - There is no evidence of deep vein thrombosis in the lower extremity.  - No cystic structure found in the popliteal fossa.  *See table(s) above for measurements and observations. Electronically signed by Gaile New MD on 08/14/2024 at 7:07:20 PM.    Final    DG HIP UNILAT WITH PELVIS 2-3 VIEWS LEFT Result Date: 08/13/2024 EXAM: 2 OR MORE VIEW(S) XRAY OF THE BILATERAL HIP 08/13/2024 01:10:00 PM COMPARISON: None available. CLINICAL HISTORY: Pain. FINDINGS: BONES AND JOINTS: No acute fracture. No malalignment. Mild bilateral hip osteoarthritis. SOFT TISSUES: Neurostimulator with generator in right lower quadrant noted. LUMBAR SPINE: Lower lumbar fusion noted. IMPRESSION: 1. No acute findings. 2. Mild bilateral hip osteoarthritis. Electronically signed by: Greig Pique MD 08/13/2024 08:36 PM EST RP Workstation: [id number]   MR KNEE RIGHT W WO CONTRAST Result Date: 08/13/2024 EXAM: MRI of the right Knee With and Without Contrast. 08/13/2024 09:16:07 AM TECHNIQUE: Multiplanar multisequence MRI of the right knee was performed without and with intravenous contrast. 8 mL of gadobutrol  (GADAVIST ) 1 MMOL/ML injection was administered. Metal artifact related to the knee prosthesis obscures surrounding findings. Metal artifact reduction sequences were employed. There is substantial field heterogeneity despite the use of metal artifact reduction sequencing, which lowers diagnostic sensitivity and specificity. COMPARISON: Radiographs 08/11/2024. CLINICAL HISTORY: Chronic right knee pain, total knee prosthesis, clinical concern is raised for septic  arthritis. FINDINGS: MEDIAL MENISCUS: Surgically absent status post total knee arthroplasty, with much of the knee joint obscured by metal artifact. LATERAL MENISCUS: Surgically absent status post total knee arthroplasty, with much of the knee joint obscured by metal artifact. ANTERIOR CRUCIATE LIGAMENT: Presumably surgically absent status post total knee arthroplasty, with much of the knee joint obscured by metal artifact. POSTERIOR CRUCIATE LIGAMENT: Presumably surgically absent status post total knee arthroplasty, with much of the knee joint obscured by metal artifact. EXTENSOR MECHANISM: Intact quadriceps and patellar tendons  with speckled metal artifact anteriorly. Intact patellar retinacula. LATERAL COLLATERAL LIGAMENT COMPLEX: Intact IT band and biceps femoris tendon. Popliteal region grossly unremarkable. MEDIAL COLLATERAL LIGAMENT COMPLEX: The visualized portion of the medial collateral ligament appears intact. KNEE JOINT: A small knee joint effusion is thought to be present in the suprapatellar bursa with mild marginal synovial enhancement. No specific indicator of infection although arthrocentesis would be warranted if there is a high clinical index of suspicion. Much of the knee joint is obscured by metal artifact. Speckled metal artifact also noted in Hoffa's fat pad without compelling evidence of gas in the joint. BONE MARROW: No acute fracture or aggressive marrow replacing lesion. No specific or compelling indicators of active osteomyelitis. SOFT TISSUE: No focal abnormality. Mild vastus medialis atrophy. IMPRESSION: 1. No specific MRI indicator of septic arthritis or active osteomyelitis; small suprapatellar joint effusion with mild marginal synovial enhancement, and arthrocentesis is warranted if there is a high clinical index of suspicion. 2. 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 3. Substantial metal artifact and field heterogeneity  from the knee prosthesis limit evaluation. Electronically signed by: Ryan Salvage MD 08/13/2024 09:32 AM EST RP Workstation: [id number]   DG Chest Portable 1 View Result Date: 08/11/2024 CLINICAL DATA:  Fever EXAM: PORTABLE CHEST 1 VIEW COMPARISON:  June 07, 2024 FINDINGS: The heart size and mediastinal contours are within normal limits. Both lungs are clear. The visualized skeletal structures are unremarkable. IMPRESSION: No active disease. Electronically Signed   By: Lynwood Landy Raddle M.D.   On: 08/11/2024 10:54   DG Knee Complete 4 Views Right Result Date: 08/11/2024 EXAM: 4 OR MORE VIEW(S) XRAY OF THE RIGHT KNEE 08/11/2024 08:43:40 AM COMPARISON: None available. CLINICAL HISTORY: pain pain FINDINGS: BONES AND JOINTS: Status post right total knee arthroplasty. No acute fracture. No malalignment. There is a mild suprapatellar bursal effusion. SOFT TISSUES: The soft tissues are unremarkable. IMPRESSION: 1. Status post right total knee arthroplasty. 2. Mild suprapatellar bursal effusion. Electronically signed by: Evalene Coho MD 08/11/2024 08:48 AM EST RP Workstation: [id number]   I personally spent a total of 50 minutes in the care of the patient today including preparing to see the patient, getting/reviewing separately obtained history, performing a medically appropriate exam/evaluation, counseling and educating, placing orders, referring and communicating with other health care professionals, documenting clinical information in the EHR, independently interpreting results, communicating results, and coordinating care.   Electronically signed by:   Annalee Orem, MD Infectious Disease Physician [facility] for Infectious Disease Pager: [phone]  "

## 2024-08-28 ENCOUNTER — Other Ambulatory Visit: Payer: Self-pay | Admitting: *Deleted

## 2024-08-28 MED ORDER — DEXLANSOPRAZOLE 60 MG PO CPDR: 1.0000 | DELAYED_RELEASE_CAPSULE | Freq: Every day | ORAL | 3 refills | Status: AC

## 2024-08-30 ENCOUNTER — Ambulatory Visit: Admitting: Infectious Diseases

## 2024-08-30 ENCOUNTER — Other Ambulatory Visit: Payer: Self-pay

## 2024-08-30 ENCOUNTER — Encounter: Payer: Self-pay | Admitting: Infectious Diseases

## 2024-08-30 VITALS — BP 138/93 | HR 101 | Temp 97.6°F | Ht 63.0 in | Wt 181.0 lb

## 2024-08-30 DIAGNOSIS — M48061 Spinal stenosis, lumbar region without neurogenic claudication: Secondary | ICD-10-CM

## 2024-08-30 DIAGNOSIS — L03115 Cellulitis of right lower limb: Secondary | ICD-10-CM | POA: Insufficient documentation

## 2024-08-30 DIAGNOSIS — Z96651 Presence of right artificial knee joint: Secondary | ICD-10-CM | POA: Diagnosis not present

## 2024-08-30 DIAGNOSIS — M5416 Radiculopathy, lumbar region: Secondary | ICD-10-CM | POA: Diagnosis present

## 2024-08-30 NOTE — Progress Notes (Signed)
 "                                                                                                                                                          [address], [place], [place], [address]                                    Phn. [phone]; Fax: [phone]                                                       Date: 08/27/24  Patient Active Problem List   Diagnosis Date Noted   SIRS (systemic inflammatory response syndrome) (HCC) 08/11/2024   Acute pain of right knee 08/11/2024   Chronic constipation 08/06/2024   Generalized abdominal pain 08/06/2024   Tremor 07/08/2024   Sacroiliitis 06/17/2024   Tongue coating 06/10/2024   Oral thrush 06/03/2024   Dyslipidemia 03/28/2024   Arthralgia of right ankle 01/12/2023   Gastroesophageal reflux disease 12/18/2022   Spinal stenosis 12/18/2022   Obesity: Starting BMI 33.3 11/09/2022   Obesity (BMI 30-39.9) 11/09/2022   Dysphagia 09/22/2022   Hypertension, essential 07/26/2022   Class 1 obesity with serious comorbidity and body mass index (BMI) of 33.0 to 33.9 in adult 06/23/2022   Mood disorder (HCC)- with emotional eating 05/23/2022   Mixed hyperlipidemia 05/23/2022   Prediabetes 04/26/2022   Depression 04/26/2022   Allergic rhinitis 01/26/2022   Morbid obesity (HCC) 09/24/2021   Exertional chest pain 09/24/2021   S/P laparoscopic cholecystectomy 12/24/2020   RUQ abdominal pain 12/25/2019   Decreased range of knee movement 09/02/2019   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   Irritable bowel syndrome with constipation 09/02/2019   Calculus, ureter 07/24/2019   Chronic migraine without aura, with intractable migraine, so stated, with status migrainosus 05/30/2019   Chronic asthmatic bronchitis (HCC) 05/14/2019   Excessive daytime sleepiness 01/08/2019   Upper airway cough syndrome 11/12/2018   Acute bacterial sinusitis 07/17/2018   Abnormal chest CT 07/17/2018   Trigeminal neuralgia of right side of face 02/01/2018    Occipital neuralgia 02/01/2018   Chronic migraine without aura without status migrainosus, not intractable 02/01/2018   Decreased strength 05/10/2017   Family history of breast cancer    Family history of prostate cancer    Chronic left shoulder pain 09/02/2015   Carpal tunnel syndrome 08/25/2011   Asthma 04/14/2009   Essential hypertension 04/14/2009   Hyperlipidemia 04/14/2009   DDD (degenerative disc disease), cervical 08/09/1999    Patient's Medications  New Prescriptions   No medications on file  Previous Medications   ACETAMINOPHEN  PO    Take 1,300 mg by mouth 2 (two) times daily.   ALBUTEROL  (VENTOLIN  HFA) 108 (90 BASE) MCG/ACT INHALER    Inhale 2 puffs into the lungs every 6 (six) hours as needed for wheezing or shortness of breath.   AMITRIPTYLINE  (ELAVIL ) 50 MG TABLET    TAKE 1 TABLET BY MOUTH EVERYDAY AT BEDTIME   AMLODIPINE  (NORVASC ) 2.5 MG TABLET    Take 1 tablet by mouth daily.   ATORVASTATIN  (LIPITOR) 20 MG TABLET    Take 1 tablet (20 mg total) by mouth daily.   BOTULINUM TOXIN TYPE A  (BOTOX ) 200 UNITS INJECTION    INJECT 155 UNITS INTRAMUSCULARLY EVERY 12 WEEKS (GIVEN AT MD  OFFICE, DISCARD UNUSED)   CELECOXIB  (CELEBREX ) 200 MG CAPSULE    Take 200 mg by mouth 2 (two) times daily.   CETIRIZINE (ZYRTEC) 10 MG TABLET    Take 10 mg by mouth daily.   DEXLANSOPRAZOLE  (DEXILANT ) 60 MG CAPSULE    Take 1 capsule (60 mg total) by mouth daily.   DICYCLOMINE  (BENTYL ) 10 MG CAPSULE    Take 2 capsules (20 mg total) by mouth in the morning and at bedtime. PRN   ELDERBERRY PO    ELDERBERRY IMMUNE SUPPORT   FLUTICASONE  (FLONASE ) 50 MCG/ACT NASAL SPRAY    Place 2 sprays into both nostrils daily.   FLUTICASONE -SALMETEROL (WIXELA INHUB) 250-50 MCG/ACT AEPB    Inhale 1 puff into the lungs in the morning and at bedtime.   FREMANEZUMAB -VFRM (AJOVY ) 225 MG/1.5ML SOAJ    Inject 225 mg into the skin every 30 (thirty) days.   FUROSEMIDE  (LASIX ) 20 MG TABLET    Take 1 tablet (20 mg total) by  mouth as needed.   GABAPENTIN  (NEURONTIN ) 300 MG CAPSULE    Take 3 capsules (900 mg total) by mouth 3 (three) times daily.   GLYCOPYRROLATE  (ROBINUL ) 2 MG TABLET    Take 1 tablet (2 mg total) by mouth 2 (two) times daily.   LIDOCAINE  (FT PAIN RELIEF MAX STRENGTH) 4 %    Place 1 patch onto the skin as needed.   LISINOPRIL  (ZESTRIL ) 20 MG TABLET    Take 20 mg by mouth 2 (two) times daily.   LUBIPROSTONE  (AMITIZA ) 24 MCG CAPSULE    TAKE 1 CAPSULE (24 MCG TOTAL) BY MOUTH 2 (TWO) TIMES DAILY WITH A MEAL.   MONTELUKAST  (SINGULAIR ) 10 MG TABLET    TAKE 1 TABLET BY MOUTH EVERYDAY AT BEDTIME   MOVANTIK  25 MG TABS TABLET    TAKE 1 TABLET (25 MG TOTAL) BY MOUTH DAILY.   OMEGA-3 FATTY ACIDS (OMEGA III EPA+DHA) 1000 MG CAPS    Take by mouth.   ONDANSETRON  (ZOFRAN ) 4 MG TABLET    Take 4 mg by mouth every 8 (eight) hours as needed.   OREGANO OIL-FLAXSEED OIL (OREGANO OIL IMMUNE SUPPORT) 50-25 MG CAPS       SPACER/AERO-HOLDING CHAMBERS (AEROCHAMBER MV) INHALER    Use as instructed   SUCRALFATE  (CARAFATE ) 1 G TABLET    Take 1 tablet (1 g total) by mouth 4 (four) times daily -  with meals and at bedtime.   SUZETRIGINE  50 MG TABS    Take 50 mg by mouth in the morning and at bedtime.   TIMOLOL  (TIMOPTIC ) 0.5 % OPHTHALMIC SOLUTION    1-2 drops eyes at onset and 30 minutes after if needed. Can repeat in 2 hours for headache/migraine.   TRAMADOL  (ULTRAM ) 50 MG TABLET  Take 100 mg by mouth 3 (three) times daily. 2x daily   VALACYCLOVIR  (VALTREX ) 500 MG TABLET    Take 500 mg by mouth 2 (two) times daily as needed (outbreaks).    VITAMIN D , CHOLECALCIFEROL , 25 MCG (1000 UT) CAPS    Take 1 capsule by mouth daily.  Modified Medications   No medications on file  Discontinued Medications   No medications on file    Subjective: Informed consent taken for use of AI scribe software.  64 year old female with prior history as below including right TKA, HTN, HLD, Arthritis, Interstitial cystitis, IBS, ADHD, depression  migraine, trigeminal and occipital neuralgia, lumbar stenosis s/p injection who is here for HFU after recent admission 1/4-/19 for possible rt knee cellulitis. Discharged to complete 2 weeks course of PO linezolid  through 1/18.  1/23 She is accompanied by her daughter. Completed po linezolid  on Sunday ( 1/18), no missed doses or concerns. She is doing PT and some mild swelling in rt knee when she does PT but no other concerns like warmth, tenderness or significant swelling. PT also wants to do warm compression in rt knee/thigh area if OK from my end (no restrictions from ID standpoint). Denies fevers, chills or other concerns   Review of Systems: all systems reviewed with pertinent positives and negatives as listed above   Past Medical History:  Diagnosis Date   ADHD    Arthritis 2010   Asthma    Back pain    Chronic headaches    Chronic pain    Constipation    Edema of both lower extremities    Family history of breast cancer    Family history of prostate cancer    Gallstones 2018   GERD (gastroesophageal reflux disease)    High cholesterol    History of kidney stones    Hx: UTI (urinary tract infection)    Hypertension    IBS (irritable bowel syndrome)    Interstitial cystitis 2012   Joint pain    Kidney problem    Migraine    Occipital neuralgia    Osteoarthritis    PNA (pneumonia) 2018   Posttraumatic muscle contracture    Pre-diabetes    Spinal stenosis    Trigeminal neuralgia    Past Surgical History:  Procedure Laterality Date   ABDOMINAL HYSTERECTOMY     ANKLE SURGERY Left 05/2009   APPENDECTOMY     Benign Tumor Removal Right 01/26/2017   right upper arm by Dr. Dale Melia at [facility] [place]    BRAIN SURGERY     CARPAL TUNNEL RELEASE Right 2014   CHOLECYSTECTOMY N/A 12/24/2020   Procedure: LAPAROSCOPIC CHOLECYSTECTOMY;  Surgeon: Vernetta Berg, MD;  Location: [facility] OR;  Service: General;  Laterality: N/A;   COLONOSCOPY     CYSTOSCOPY W/ URETERAL STENT  PLACEMENT Left 07/25/2019   Procedure: CYSTOSCOPY WITH RETROGRADE PYELOGRAM/URETERAL STENT PLACEMENT;  Surgeon: Matilda Senior, MD;  Location: [facility];  Service: Urology;  Laterality: Left;   CYSTOSCOPY W/ URETERAL STENT REMOVAL Left 08/15/2019   Procedure: CYSTOSCOPY WITH STENT REMOVAL;  Surgeon: Matilda Senior, MD;  Location: [facility];  Service: Urology;  Laterality: Left;   CYSTOSCOPY WITH RETROGRADE PYELOGRAM, URETEROSCOPY AND STENT PLACEMENT Left 08/15/2019   Procedure: CYSTOSCOPY WITH RETROGRADE PYELOGRAM, URETEROSCOPY;  Surgeon: Matilda Senior, MD;  Location: [facility];  Service: Urology;  Laterality: Left;  90 MINS   CYSTOSCOPY WITH RETROGRADE PYELOGRAM, URETEROSCOPY AND STENT PLACEMENT Right 09/12/2019   Procedure: CYSTOSCOPY WITH RIGHT RETROGRADE PYELOGRAM  URETEROSCOPY WITH HOLMIUM  LASER STONE EXTRACTION AND STENT PLACEMENT;  Surgeon: Matilda Senior, MD;  Location: [facility];  Service: Urology;  Laterality: Right;   DILATION AND CURETTAGE OF UTERUS     HOLMIUM LASER APPLICATION Left 08/15/2019   Procedure: HOLMIUM LASER APPLICATION;  Surgeon: Matilda Senior, MD;  Location: [facility];  Service: Urology;  Laterality: Left;   JOINT REPLACEMENT     R knee   KIDNEY STONE SURGERY     neuro spine similator  01/2021   OVARIAN CYST REMOVAL  05/2010   right knee revision  2016   ROTATOR CUFF REPAIR Right 02/2019   SPINE SURGERY     x 3   TUBAL LIGATION      Social History[1]  Family History  Problem Relation Age of Onset   Ulcers Mother 45       peptic ulcer   Irritable bowel syndrome Mother    High blood pressure Mother    Sudden death Mother    Depression Mother    Prostate cancer Father 55   Heart disease Father    Irritable bowel syndrome Father    High blood pressure Father    Breast cancer Sister 75       ER+/PR+/Her2- breast cancer   Alzheimer's disease Sister    Hypertension Sister    Kidney disease Sister    Celiac disease Sister    Thyroid  disease Sister     Breast cancer Paternal Grandmother    Diabetes Paternal Grandmother    Cancer Paternal Uncle        unknown cancers   Liver disease Neg Hx    Esophageal cancer Neg Hx    Colon cancer Neg Hx     Allergies[2]  Health Maintenance  Topic Date Due   Cervical Cancer Screening (HPV/Pap Cotest)  Never done   Medicare Annual Wellness (AWV)  03/29/2024   Mammogram  07/11/2024   Influenza Vaccine  11/05/2024 (Originally 03/08/2024)   COVID-19 Vaccine (9 - 2025-26 season) 04/07/2025 (Originally 04/08/2024)   DTaP/Tdap/Td (2 - Td or Tdap) 01/12/2027   Colonoscopy  12/01/2032   Pneumococcal Vaccine: 50+ Years  Completed   HPV VACCINES (No Doses Required) Completed   Hepatitis C Screening  Completed   HIV Screening  Completed   Zoster Vaccines- Shingrix  Completed   Hepatitis B Vaccines 19-59 Average Risk  Aged Out   Meningococcal B Vaccine  Aged Out    Objective: BP (!) 138/93   Pulse (!) 101   Temp 97.6 F (36.4 C) (Temporal)   Ht 5' 3 (1.6 m)   Wt 181 lb (82.1 kg)   SpO2 98%   BMI 32.06 kg/m '  Physical Exam Constitutional:      Appearance: Normal appearance.  HENT:     Head: Normocephalic and atraumatic.      Mouth: Mucous membranes are moist.  Eyes:    Conjunctiva/sclera: Conjunctivae normal.     Pupils: Pupils are equal, round, and b/l symmetrical    Cardiovascular:     Rate and Rhythm: Normal rate and regular rhythm.     Heart sounds:   Pulmonary:     Effort: Pulmonary effort is normal.     Breath sounds: Normal breath sounds.   Abdominal:     General: Non distended     Palpations: soft.   Musculoskeletal:        General: Normal range of motion.   Skin:    General: Skin is warm and dry.     Comments:  Neurological:     General: grossly non focal     Mental Status: awake, alert and oriented to person, place, and time.   Psychiatric:        Mood and Affect: Mood normal.   Lab Results Lab Results  Component Value Date   WBC 5.8 08/15/2024   HGB  10.3 (L) 08/15/2024   HCT 31.6 (L) 08/15/2024   MCV 98.1 08/15/2024   PLT 374 08/15/2024    Lab Results  Component Value Date   CREATININE 0.77 08/16/2024   BUN 8 08/15/2024   NA 141 08/15/2024   K 4.0 08/15/2024   CL 107 08/15/2024   CO2 25 08/15/2024    Lab Results  Component Value Date   ALT 75 (H) 08/15/2024   AST 32 08/15/2024   ALKPHOS 108 08/15/2024   BILITOT 0.3 08/15/2024    Lab Results  Component Value Date   CHOL 194 11/09/2022   HDL 64 11/09/2022   LDLCALC 107 (H) 11/09/2022   TRIG 130 11/09/2022   CHOLHDL 3.1 04/26/2022   No results found for: LABRPR, RPRTITER No results found for: HIV1RNAQUANT, HIV1RNAVL, CD4TABS   Microbiology Results for orders placed or performed during the hospital encounter of 08/11/24  Blood culture (routine x 2)     Status: None   Collection Time: 08/11/24  8:55 AM   Specimen: BLOOD LEFT HAND  Result Value Ref Range Status   Specimen Description   Final    BLOOD LEFT HAND Performed at [facility], [address], [place], [place] [address]    Special Requests   Final    BOTTLES DRAWN AEROBIC AND ANAEROBIC Blood Culture adequate volume Performed at [facility], [address], [place], [place] [address]    Culture   Final    NO GROWTH 5 DAYS Performed at [facility], [address] [place], [place] [address]    Report Status 08/16/2024 FINAL  Final  Blood culture (routine x 2)     Status: None   Collection Time: 08/11/24  8:55 AM   Specimen: BLOOD  Result Value Ref Range Status   Specimen Description   Final    BLOOD RIGHT ANTECUBITAL Performed at [facility], [address] [place], [place] [address]    Special Requests   Final    BOTTLES DRAWN AEROBIC AND ANAEROBIC Blood Culture adequate volume Performed at [facility], [address], [place], [place] [address]    Culture   Final    NO GROWTH 5 DAYS Performed at  [facility], [address], [place], [place] [address]    Report Status 08/16/2024 FINAL  Final  Resp panel by RT-PCR (RSV, Flu A&B, Covid) Peripheral     Status: None   Collection Time: 08/11/24  8:55 AM   Specimen: Peripheral; Nasal Swab  Result Value Ref Range Status   SARS Coronavirus 2 by RT PCR NEGATIVE NEGATIVE Final    Comment: (NOTE) SARS-CoV-2 target nucleic acids are NOT DETECTED.  The SARS-CoV-2 RNA is generally detectable in upper respiratory specimens during the acute phase of infection. The lowest concentration of SARS-CoV-2 viral copies this assay can detect is 138 copies/mL. A negative result does not preclude SARS-Cov-2 infection and should not be used as the sole basis for treatment or other patient management decisions. A negative result may occur with  improper specimen collection/handling, submission of specimen other than nasopharyngeal swab, presence of viral mutation(s) within the areas targeted by  this assay, and inadequate number of viral copies(<138 copies/mL). A negative result must be combined with clinical observations, patient history, and epidemiological information. The expected result is Negative.  Fact Sheet for Patients:  bloggercourse.com  Fact Sheet for Healthcare Providers:  seriousbroker.it  This test is no t yet approved or cleared by the [place]  FDA and  has been authorized for detection and/or diagnosis of SARS-CoV-2 by FDA under an Emergency Use Authorization (EUA). This EUA will remain  in effect (meaning this test can be used) for the duration of the COVID-19 declaration under Section 564(b)(1) of the Act, 21 U.S.C.section 360bbb-3(b)(1), unless the authorization is terminated  or revoked sooner.       Influenza A by PCR NEGATIVE NEGATIVE Final   Influenza B by PCR NEGATIVE NEGATIVE Final    Comment: (NOTE) The Xpert Xpress SARS-CoV-2/FLU/RSV plus assay is intended  as an aid in the diagnosis of influenza from Nasopharyngeal swab specimens and should not be used as a sole basis for treatment. Nasal washings and aspirates are unacceptable for Xpert Xpress SARS-CoV-2/FLU/RSV testing.  Fact Sheet for Patients: bloggercourse.com  Fact Sheet for Healthcare Providers: seriousbroker.it  This test is not yet approved or cleared by the [place]  FDA and has been authorized for detection and/or diagnosis of SARS-CoV-2 by FDA under an Emergency Use Authorization (EUA). This EUA will remain in effect (meaning this test can be used) for the duration of the COVID-19 declaration under Section 564(b)(1) of the Act, 21 U.S.C. section 360bbb-3(b)(1), unless the authorization is terminated or revoked.     Resp Syncytial Virus by PCR NEGATIVE NEGATIVE Final    Comment: (NOTE) Fact Sheet for Patients: bloggercourse.com  Fact Sheet for Healthcare Providers: seriousbroker.it  This test is not yet approved or cleared by the [place]  FDA and has been authorized for detection and/or diagnosis of SARS-CoV-2 by FDA under an Emergency Use Authorization (EUA). This EUA will remain in effect (meaning this test can be used) for the duration of the COVID-19 declaration under Section 564(b)(1) of the Act, 21 U.S.C. section 360bbb-3(b)(1), unless the authorization is terminated or revoked.  Performed at [facility], [address] [place], [place] [address]   Urine Culture     Status: Abnormal   Collection Time: 08/11/24 11:58 AM   Specimen: Urine, Clean Catch  Result Value Ref Range Status   Specimen Description   Final    URINE, CLEAN CATCH Performed at [facility], [address], [place], [place] [address]    Special Requests   Final    NONE Performed at [facility], [address]  [place], [place] [address]    Culture MULTIPLE SPECIES PRESENT, SUGGEST RECOLLECTION (A)  Final   Report Status 08/12/2024 FINAL  Final   Imaging No results found.  Assessment/Plan 64 year old female with prior history as below including right TKA, HTN, HLD, Arthritis, Interstitial cystitis, IBS, ADHD, depression migraine, trigeminal and occipital neuralgia,lumbar stenosis s/p back injection with    # Possible rt knee cellulitis  - s/p completion of antibiotics  - clinically improved  - no concerns for infection of rt TKA - discussed signs and symptoms of prosthetic joint infection like significant swelling, redness, warmth, pain, fevers etc and to seek attention. Mild swelling after over exerting knee could be a reactive process and does not indicate infection in the absence of other signs of infection  - fu as needed  # Chronic low back pain w radiculopathy  - Follow-up with  Ortho for lumbar spinal stenosis  I personally spent a total of 20 minutes in the care of the patient today including reviewing discharge summary 1/9, pain clinic note on 08/26/24, preparing to see the patient, getting/reviewing separately obtained history, performing a medically appropriate exam/evaluation, counseling and educating, and documenting clinical information in the EHR.  Annalee Joseph, MD [facility] 08/30/2024, 9:42 AM     [1]  Social History Tobacco Use   Smoking status: Never   Smokeless tobacco: Never  Vaping Use   Vaping status: Never Used  Substance Use Topics   Alcohol use: Yes    Comment: socially   Drug use: No  [2]  Allergies Allergen Reactions   Aspirin Other (See Comments), Nausea And Vomiting and Nausea Only    Stomach upset Avoids due to IBS Due to ibs Stomach upset Due to ibs Avoids due to IBS   Ketorolac  Hives   Ketorolac  Tromethamine  Shortness Of Breath, Itching and Other (See Comments)    IM administration ONLY   REDNESS IM administration ONLY  REDNESS  SWELLING OF THE THROAT   ORAL TORADOL  CAUSED EXTREME REDNESS, ITCHINESS, DIFFICULTY BREATHING, AND FACIAL SWELLING. PATIENT ALSO DIAGNOSED WITH BELL'S PALSY.   Penicillins Hives, Itching, Rash and Other (See Comments)    Did it involve swelling of the face/tongue/throat, SOB, or low BP? No Did it involve sudden or severe rash/hives, skin peeling, or any reaction on the inside of your mouth or nose? Yes Did you need to seek medical attention at a hospital or doctor's office? No When did it last happen?      2000 If all above answers are NO, may proceed with cephalosporin use.  Vaginal rash Vaginal rash Did it involve swelling of the face/tongue/throat, SOB, or low BP? No Did it involve sudden or severe rash/hives, skin peeling, or any reaction on the inside of your mouth or nose? Yes Did you need to seek medical attention at a hospital or doctor's office? No When did it last happen?      2000 If all above answers are NO, may proceed with cephalosporin use. Vaginal rash Vaginal rash   Codeine Other (See Comments)    Cough meds with codeine-have withdraw symptoms when done Cough meds with codeine-have withdraw symptoms when done Cough meds with codeine-have withdraw symptoms when done   Ibuprofen Other (See Comments) and Nausea And Vomiting    Stomach upset Avoids due to IBS Stomach upset Avoids due to IBS   Latex Rash and Other (See Comments)    Vaginal irritation Vaginal irritation Exacerbates genital herpes Exacerbates genital herpes   Omeprazole Magnesium Other (See Comments)    Lack of therapeutic effect Lack of therapeutic effect   Pantoprazole  Sodium Other (See Comments)    Lack of therapeutic effect Lack of therapeutic effect   Penicillamine Rash   Pregabalin Other (See Comments)    Weight gain Weight gain   Phenazopyridine      Other Reaction(s): angioedema   Pyridium  [Phenazopyridine  Hcl] Swelling    Lip swelling  and burning   "

## 2024-09-03 ENCOUNTER — Ambulatory Visit: Admitting: Gastroenterology

## 2024-09-03 NOTE — Progress Notes (Unsigned)
 "  Cassidy Olsen [id number] [birth year]   Chief Complaint:  Referring Provider: Florie Cohen, MD Primary GI MD: Dr. Shila  HPI: Cassidy Olsen is a 64 y.o. female with past medical history of ADHD, asthma, constipation, GERD, HTN, HLD, kidney stones, interstitial cystitis, IBS, spinal stenosis, chronic back pain, right TKA 2015, migraines, appendectomy, cholecystectomy, hysterectomy who presents today for a complaint of abdominal pain.    Patient last seen in office 08/06/2024 by Harlene Mail, PA-C for complaint of persistent abdominal pain in setting of history of chronic gastritis and chronic constipation.  Was experiencing persistent mid to upper abdominal pain since the week prior, similar to but less severe than prior episodes and unresponsive to her usual medications.  Denied any nausea or heartburn, no acute alarming features.  She was prescribed Carafate  4 times daily for 1 week and advised to let us  know if symptoms did not improve.  Advised that a CT scan may be considered if no improvement to rule out other pathology.  Advised to continue Dexilant , as well as Movantik  and Amitiza  for her chronic constipation which was currently working well for her.  She was admitted to the hospital 08/11/2024 to 08/16/2024 for sepsis due to cellulitis.  Presented with right knee pain which began after doing some exercises.  In the ED she was febrile to 103.1, WBC 13.3 with left shift.  Lactic acid negative.  Admitted for SIRS with concern for right knee infection.  She was placed on empiric IV antibiotics and admitted.  Septic arthritis was felt to be highly unlikely and no fluid aspirated after 2 attempts.  Leukocytosis resolved.  Continued to have severe right leg pain.  MRI lumbar spine was negative for discitis/osteomyelitis but showed new superiorly directed right subarticular disc protrusion at L4-L5 with mild progressive severe canal and similar severe right foraminal stenosis.  ID consulted and  recommended Zyvox  on discharge and patient advised to follow-up with orthopedic surgery outpatient.  Discussed the use of AI scribe software for clinical note transcription with the patient, who gave verbal consent to proceed.  History of Present Illness       Previous GI Procedures/Imaging   Colonoscopy 12/02/2022 - Non- bleeding internal hemorrhoids.  - The examination was otherwise normal.  - No specimens collected. - Recall 10 years  Past Medical History:  Diagnosis Date   ADHD    Arthritis 2010   Asthma    Back pain    Chronic headaches    Chronic pain    Constipation    Edema of both lower extremities    Family history of breast cancer    Family history of prostate cancer    Gallstones 2018   GERD (gastroesophageal reflux disease)    High cholesterol    History of kidney stones    Hx: UTI (urinary tract infection)    Hypertension    IBS (irritable bowel syndrome)    Interstitial cystitis 2012   Joint pain    Kidney problem    Migraine    Occipital neuralgia    Osteoarthritis    PNA (pneumonia) 2018   Posttraumatic muscle contracture    Pre-diabetes    Spinal stenosis    Trigeminal neuralgia     Past Surgical History:  Procedure Laterality Date   ABDOMINAL HYSTERECTOMY     ANKLE SURGERY Left 05/2009   APPENDECTOMY     Benign Tumor Removal Right 01/26/2017   right upper arm by Dr. Dale Melia at [facility] [place]   BRAIN SURGERY     CARPAL TUNNEL RELEASE Right 2014   CHOLECYSTECTOMY N/A 12/24/2020   Procedure: LAPAROSCOPIC CHOLECYSTECTOMY;  Surgeon: Vernetta Berg, MD;  Location: [facility] OR;  Service: General;  Laterality: N/A;   COLONOSCOPY     CYSTOSCOPY W/ URETERAL STENT PLACEMENT Left 07/25/2019   Procedure: CYSTOSCOPY WITH RETROGRADE PYELOGRAM/URETERAL STENT PLACEMENT;  Surgeon: Matilda Senior, MD;  Location: [facility];  Service: Urology;  Laterality: Left;   CYSTOSCOPY W/ URETERAL STENT REMOVAL Left 08/15/2019   Procedure: CYSTOSCOPY  WITH STENT REMOVAL;  Surgeon: Matilda Senior, MD;  Location: [facility];  Service: Urology;  Laterality: Left;   CYSTOSCOPY WITH RETROGRADE PYELOGRAM, URETEROSCOPY AND STENT PLACEMENT Left 08/15/2019   Procedure: CYSTOSCOPY WITH RETROGRADE PYELOGRAM, URETEROSCOPY;  Surgeon: Matilda Senior, MD;  Location: [facility];  Service: Urology;  Laterality: Left;  90 MINS   CYSTOSCOPY WITH RETROGRADE PYELOGRAM, URETEROSCOPY AND STENT PLACEMENT Right 09/12/2019   Procedure: CYSTOSCOPY WITH RIGHT RETROGRADE PYELOGRAM  URETEROSCOPY WITH HOLMIUM LASER STONE EXTRACTION AND STENT PLACEMENT;  Surgeon: Matilda Senior, MD;  Location: [facility];  Service: Urology;  Laterality: Right;   DILATION AND CURETTAGE OF UTERUS     HOLMIUM LASER APPLICATION Left 08/15/2019   Procedure: HOLMIUM LASER APPLICATION;  Surgeon: Matilda Senior, MD;  Location: [facility];  Service: Urology;  Laterality: Left;   JOINT REPLACEMENT     R knee   KIDNEY STONE SURGERY     neuro spine similator  01/2021   OVARIAN CYST REMOVAL  05/2010   right knee revision  2016   ROTATOR CUFF REPAIR Right 02/2019   SPINE SURGERY     x 3   TUBAL LIGATION      Current Outpatient Medications  Medication Sig Dispense Refill   ACETAMINOPHEN  PO Take 1,300 mg by mouth 2 (two) times daily.     albuterol  (VENTOLIN  HFA) 108 (90 Base) MCG/ACT inhaler Inhale 2 puffs into the lungs every 6 (six) hours as needed for wheezing or shortness of breath. 8 g 6   amitriptyline  (ELAVIL ) 50 MG tablet TAKE 1 TABLET BY MOUTH EVERYDAY AT BEDTIME 90 tablet 1   amLODipine  (NORVASC ) 2.5 MG tablet Take 1 tablet by mouth daily.     atorvastatin  (LIPITOR) 20 MG tablet Take 1 tablet (20 mg total) by mouth daily. 50 tablet 0   botulinum toxin Type A  (BOTOX ) 200 units injection INJECT 155 UNITS INTRAMUSCULARLY EVERY 12 WEEKS (GIVEN AT MD  OFFICE, DISCARD UNUSED) 1 each 1   celecoxib  (CELEBREX ) 200 MG capsule Take 200 mg by mouth 2 (two) times daily.     cetirizine (ZYRTEC) 10 MG  tablet Take 10 mg by mouth daily.     dexlansoprazole  (DEXILANT ) 60 MG capsule Take 1 capsule (60 mg total) by mouth daily. 90 capsule 3   dicyclomine  (BENTYL ) 10 MG capsule Take 2 capsules (20 mg total) by mouth in the morning and at bedtime. PRN 360 capsule 3   ELDERBERRY PO ELDERBERRY IMMUNE SUPPORT     fluticasone  (FLONASE ) 50 MCG/ACT nasal spray Place 2 sprays into both nostrils daily. 16 g 11   fluticasone -salmeterol (WIXELA INHUB) 250-50 MCG/ACT AEPB Inhale 1 puff into the lungs in the morning and at bedtime. 60 each 5   Fremanezumab -vfrm (AJOVY ) 225 MG/1.5ML SOAJ Inject 225 mg into the skin every 30 (thirty) days. 4.5 mL 3   furosemide  (LASIX ) 20 MG tablet Take 1 tablet (20 mg total) by mouth as needed. 10 tablet 0   gabapentin  (NEURONTIN ) 300 MG capsule Take 3  capsules (900 mg total) by mouth 3 (three) times daily. 270 capsule 0   glycopyrrolate  (ROBINUL ) 2 MG tablet Take 1 tablet (2 mg total) by mouth 2 (two) times daily. 180 tablet 3   lidocaine  (FT PAIN RELIEF MAX STRENGTH) 4 % Place 1 patch onto the skin as needed. 20 patch 0   lisinopril  (ZESTRIL ) 20 MG tablet Take 20 mg by mouth 2 (two) times daily.     lubiprostone  (AMITIZA ) 24 MCG capsule TAKE 1 CAPSULE (24 MCG TOTAL) BY MOUTH 2 (TWO) TIMES DAILY WITH A MEAL. 180 capsule 1   montelukast  (SINGULAIR ) 10 MG tablet TAKE 1 TABLET BY MOUTH EVERYDAY AT BEDTIME 90 tablet 1   MOVANTIK  25 MG TABS tablet TAKE 1 TABLET (25 MG TOTAL) BY MOUTH DAILY. 30 tablet 3   Omega-3 Fatty Acids (OMEGA III EPA+DHA) 1000 MG CAPS Take by mouth.     ondansetron  (ZOFRAN ) 4 MG tablet Take 4 mg by mouth every 8 (eight) hours as needed.     Oregano Oil-Flaxseed Oil (OREGANO OIL IMMUNE SUPPORT) 50-25 MG CAPS      Spacer/Aero-Holding Chambers (AEROCHAMBER MV) inhaler Use as instructed (Patient not taking: Reported on 08/30/2024) 1 each 0   sucralfate  (CARAFATE ) 1 g tablet Take 1 tablet (1 g total) by mouth 4 (four) times daily -  with meals and at bedtime. (Patient  not taking: Reported on 08/30/2024) 120 tablet 0   Suzetrigine  50 MG TABS Take 50 mg by mouth in the morning and at bedtime.     timolol  (TIMOPTIC ) 0.5 % ophthalmic solution 1-2 drops eyes at onset and 30 minutes after if needed. Can repeat in 2 hours for headache/migraine. (Patient not taking: Reported on 08/30/2024) 10 mL 12   traMADol  (ULTRAM ) 50 MG tablet Take 100 mg by mouth 3 (three) times daily. 2x daily     valACYclovir  (VALTREX ) 500 MG tablet Take 500 mg by mouth 2 (two) times daily as needed (outbreaks).      Vitamin D , Cholecalciferol , 25 MCG (1000 UT) CAPS Take 1 capsule by mouth daily.     No current facility-administered medications for this visit.    Allergies as of 09/03/2024 - Review Complete 08/30/2024  Allergen Reaction Noted   Aspirin Other (See Comments), Nausea And Vomiting, and Nausea Only 04/18/2013   Ketorolac  Hives 08/31/2022   Ketorolac  tromethamine  Shortness Of Breath, Itching, and Other (See Comments) 08/14/2019   Penicillins Hives, Itching, Rash, and Other (See Comments) 03/19/1994   Codeine Other (See Comments) 07/17/2018   Ibuprofen Other (See Comments) and Nausea And Vomiting 09/03/2016   Latex Rash and Other (See Comments) 03/07/2011   Omeprazole magnesium Other (See Comments) 12/25/2019   Pantoprazole  sodium Other (See Comments) 12/25/2019   Penicillamine Rash 12/26/2019   Pregabalin Other (See Comments) 12/25/2019   Phenazopyridine   08/21/2023   Pyridium  [phenazopyridine  hcl] Swelling 06/02/2022    Family History  Problem Relation Age of Onset   Ulcers Mother 16       peptic ulcer   Irritable bowel syndrome Mother    High blood pressure Mother    Sudden death Mother    Depression Mother    Prostate cancer Father 77   Heart disease Father    Irritable bowel syndrome Father    High blood pressure Father    Breast cancer Sister 74       ER+/PR+/Her2- breast cancer   Alzheimer's disease Sister    Hypertension Sister    Kidney disease Sister     Celiac  disease Sister    Thyroid  disease Sister    Breast cancer Paternal Grandmother    Diabetes Paternal Grandmother    Cancer Paternal Uncle        unknown cancers   Liver disease Neg Hx    Esophageal cancer Neg Hx    Colon cancer Neg Hx     Social History[1]   Review of Systems:    Constitutional: No weight loss, fever, chills, weakness or fatigue Eyes: No change in vision Ears, Nose, Throat:  No change in hearing or congestion Skin: No rash or itching Cardiovascular: No chest pain, chest pressure or palpitations   Respiratory: No SOB or cough Gastrointestinal: See HPI and otherwise negative Genitourinary: No dysuria or change in urinary frequency Neurological: No headache, dizziness or syncope Musculoskeletal: No new muscle or joint pain Hematologic: No bleeding or bruising    Physical Exam:  Vital signs: There were no vitals taken for this visit.  Wt Readings from Last 3 Encounters:  08/30/24 181 lb (82.1 kg)  08/11/24 183 lb 4 oz (83.1 kg)  08/06/24 183 lb 4 oz (83.1 kg)     Constitutional: NAD, Well developed, Well nourished, alert and cooperative Head:  Normocephalic and atraumatic.  Eyes: No scleral icterus. Conjunctiva pink. Mouth: No oral lesions. Respiratory: Respirations even and unlabored. Lungs clear to auscultation bilaterally.  No wheezes, crackles, or rhonchi.  Cardiovascular:  Regular rate and rhythm. No murmurs. No peripheral edema. Gastrointestinal:  Soft, nondistended, nontender. No rebound or guarding. Normal bowel sounds. No appreciable masses or hepatomegaly. Rectal:  Not performed.  Neurologic:  Alert and oriented x4;  grossly normal neurologically.  Skin:   Dry and intact without significant lesions or rashes. Psychiatric: Oriented to person, place and time. Demonstrates good judgement and reason without abnormal affect or behaviors.   Echocardiogram 07/11/2022 1. Left ventricular ejection fraction, by estimation, is 70 to 75% . The left  ventricle has hyperdynamic function. The left ventricle has no regional wall motion abnormalities. There is mild concentric left ventricular hypertrophy. Left ventricular diastolic parameters are consistent with Grade I diastolic dysfunction ( impaired relaxation) .  2. Right ventricular systolic function is normal. The right ventricular size is normal.  3. The mitral valve is normal in structure. Trivial mitral valve regurgitation. No evidence of mitral stenosis.  4. The aortic valve is tricuspid. There is mild thickening of the aortic valve. Aortic valve regurgitation is not visualized. Aortic valve sclerosis is present, with no evidence of aortic valve stenosis.  5. The inferior vena cava is normal in size with greater than 50% respiratory variability, suggesting right atrial pressure of 3 mmHg.   Assessment/Plan:   Assessment & Plan    If continued abdominal pain get a CT scan Consider upper endoscopy as well    Camie Furbish, PA-C [facility] 09/03/2024, 9:33 AM  Patient Care Team: Perri Starleen BROCKS, MD as PCP - General (Nephrology) Court Dorn PARAS, MD as PCP - Cardiology (Cardiology) Icard, Adine CROME, DO as Consulting Physician (Pulmonary Disease) Zelphia Redbird, PT as Physical Therapist (Physical Therapy)      [1]  Social History Tobacco Use   Smoking status: Never   Smokeless tobacco: Never  Vaping Use   Vaping status: Never Used  Substance Use Topics   Alcohol use: Yes    Comment: socially   Drug use: No   "

## 2024-09-04 ENCOUNTER — Encounter: Payer: Self-pay | Admitting: Gastroenterology

## 2024-09-04 ENCOUNTER — Ambulatory Visit: Admitting: Gastroenterology

## 2024-09-04 VITALS — BP 120/84 | HR 62 | Ht 63.0 in | Wt 183.5 lb

## 2024-09-04 DIAGNOSIS — R1013 Epigastric pain: Secondary | ICD-10-CM | POA: Diagnosis not present

## 2024-09-04 DIAGNOSIS — K581 Irritable bowel syndrome with constipation: Secondary | ICD-10-CM | POA: Diagnosis not present

## 2024-09-04 DIAGNOSIS — K219 Gastro-esophageal reflux disease without esophagitis: Secondary | ICD-10-CM | POA: Diagnosis not present

## 2024-09-04 DIAGNOSIS — R1084 Generalized abdominal pain: Secondary | ICD-10-CM

## 2024-09-04 MED ORDER — FAMOTIDINE 20 MG PO TABS: 20.0000 mg | ORAL_TABLET | Freq: Every day | ORAL | 3 refills | Status: AC

## 2024-09-04 NOTE — Progress Notes (Unsigned)
 "                Cassidy Olsen    [id number]    Sep 08, 1960  Primary Care Physician:Powell, Starleen BROCKS, MD  Referring Physician: Perri Starleen BROCKS, MD [address] [place],  [place] [address]   Chief complaint:  Abdominal pain  Discussed the use of AI scribe software for clinical note transcription with the patient, who gave verbal consent to proceed.  History of Present Illness Cassidy Olsen is a 64 year old female with chronic abdominal pain who presents for evaluation of persistent epigastric pain.  Epigastric Pain: - Persistent epigastric pain began around Christmas Eve - Pain quality is scratchy and crampy - Aggravated by eating, especially fried or heavy foods - Significantly reduces enjoyment of eating - Initial improvement with sucralfate  three times daily during hospitalization and at home until January 15th - Pain recurred four days after stopping sucralfate , following consumption of fried food - Ongoing pain despite dietary modifications, leading to frustration - Prefers not to use sucralfate  daily due to concerns about long-term side effects - Uses Bentyl  only as needed - Last upper endoscopy was in 2018  Bowel Habits: - Bowel movements occur at least twice daily - Stools are not solid but not watery, described as as soon as it hits the toilet, it wakes up - Satisfied with current bowel regimen   GI and other relevant Hx:     US  abd 04/13/2023 1. Prior cholecystectomy. 2. Increased hepatic parenchymal echogenicity suggestive of steatosis.   CT renal   Colonoscopy 12/02/2022 - Non- bleeding internal hemorrhoids.  - The examination was otherwise normal.   Colonoscopy February 2018, small adenomatous colon polyp removed and medium-sized hemorrhoids   EGD February 2018 showed hiatal hernia and gastritis.  Biopsies negative for H. pylori.  Duodenal biopsies negative for celiac.   Patient had work-up in [place] , HIDA scan and small bowel series insert negative  for any significant pathology   Sister has celiac disease and her daughter has gluten sensitivity.  She had negative celiac testing, TTG IgA antibody was undetectable   S/p kidney stone removal 09/2019, has bilateral multiple small kidney stones was told it is residual fragments and she will eventually passed them     Abdominal ultrasound Jan 02, 2020: 1. Gallstones without sonographic evidence for acute cholecystitis or biliary dilatation 2. Bilateral kidney stones without hydronephrosis 3. Complicated cyst upper pole left kidney measuring 1.3 cm, could be more thoroughly characterized by MRI.   CT abdomen and pelvis with contrast December 2020 1. Obstructing 8 x 6 mm calculus at the left ureteropelvic junction resulting in mild-to-moderate left hydronephrosis with small volume perinephric fluid and stranding. Extensive left-sided urothelial enhancement and stranding raises suspicion for a superimposed infectious or inflammatory process. 2. Thickened appearance of the urinary bladder, particularly along its superior margin. Recommend correlation with urinalysis. 3. Multiple nonobstructing right renal calculi, largest measuring up to 9 mm. 4. Cholelithiasis without evidence for cholecystitis. 5. Previously seen left adnexal cyst has slightly decreased in size.    Echo 07-11-22 1.Left ventricular ejection fraction, by estimation, is 70 to 75%. The left ventricle has hyperdynamic function. The left ventricle has no regional wall motion abnormalities. There is mild concentric left ventricular hypertrophy. Left ventricular diastolic parameters are consistent with Grade I diastolic dysfunction (impaired relaxation).  2. Right ventricular systolic function is normal.  3.The right ventricular size is normal. The mitral valve is normal in structure. Trivial mitral valve regurgitation. No evidence of  mitral stenosis.  4. The aortic valve is tricuspid. There is mild thickening of the aortic valve.  Aortic valve regurgitation is not visualized. Aortic valve sclerosis is present, with no evidence of aortic valve stenosis.  5. The inferior vena cava is normal in size with greater than 50% respiratory variability, suggesting right atrial pressure of 3 mmHg.   Outpatient Encounter Medications as of 09/04/2024  Medication Sig   ACETAMINOPHEN  PO Take 1,300 mg by mouth 2 (two) times daily.   albuterol  (VENTOLIN  HFA) 108 (90 Base) MCG/ACT inhaler Inhale 2 puffs into the lungs every 6 (six) hours as needed for wheezing or shortness of breath.   amitriptyline  (ELAVIL ) 50 MG tablet TAKE 1 TABLET BY MOUTH EVERYDAY AT BEDTIME   amLODipine  (NORVASC ) 2.5 MG tablet Take 1 tablet by mouth daily.   atorvastatin  (LIPITOR) 20 MG tablet Take 1 tablet (20 mg total) by mouth daily.   botulinum toxin Type A  (BOTOX ) 200 units injection INJECT 155 UNITS INTRAMUSCULARLY EVERY 12 WEEKS (GIVEN AT MD  OFFICE, DISCARD UNUSED)   celecoxib  (CELEBREX ) 200 MG capsule Take 200 mg by mouth 2 (two) times daily.   cetirizine (ZYRTEC) 10 MG tablet Take 10 mg by mouth daily.   dexlansoprazole  (DEXILANT ) 60 MG capsule Take 1 capsule (60 mg total) by mouth daily.   dicyclomine  (BENTYL ) 10 MG capsule Take 2 capsules (20 mg total) by mouth in the morning and at bedtime. PRN   ELDERBERRY PO ELDERBERRY IMMUNE SUPPORT   fluticasone  (FLONASE ) 50 MCG/ACT nasal spray Place 2 sprays into both nostrils daily.   fluticasone -salmeterol (WIXELA INHUB) 250-50 MCG/ACT AEPB Inhale 1 puff into the lungs in the morning and at bedtime.   Fremanezumab -vfrm (AJOVY ) 225 MG/1.5ML SOAJ Inject 225 mg into the skin every 30 (thirty) days.   furosemide  (LASIX ) 20 MG tablet Take 1 tablet (20 mg total) by mouth as needed.   gabapentin  (NEURONTIN ) 300 MG capsule Take 3 capsules (900 mg total) by mouth 3 (three) times daily.   glycopyrrolate  (ROBINUL ) 2 MG tablet Take 1 tablet (2 mg total) by mouth 2 (two) times daily.   lidocaine  (FT PAIN RELIEF MAX STRENGTH) 4  % Place 1 patch onto the skin as needed.   lisinopril  (ZESTRIL ) 20 MG tablet Take 20 mg by mouth 2 (two) times daily.   lubiprostone  (AMITIZA ) 24 MCG capsule TAKE 1 CAPSULE (24 MCG TOTAL) BY MOUTH 2 (TWO) TIMES DAILY WITH A MEAL.   montelukast  (SINGULAIR ) 10 MG tablet TAKE 1 TABLET BY MOUTH EVERYDAY AT BEDTIME   MOVANTIK  25 MG TABS tablet TAKE 1 TABLET (25 MG TOTAL) BY MOUTH DAILY.   Omega-3 Fatty Acids (OMEGA III EPA+DHA) 1000 MG CAPS Take by mouth.   ondansetron  (ZOFRAN ) 4 MG tablet Take 4 mg by mouth every 8 (eight) hours as needed.   Suzetrigine  50 MG TABS Take 50 mg by mouth in the morning and at bedtime.   timolol  (TIMOPTIC ) 0.5 % ophthalmic solution 1-2 drops eyes at onset and 30 minutes after if needed. Can repeat in 2 hours for headache/migraine.   traMADol  (ULTRAM ) 50 MG tablet Take 100 mg by mouth 3 (three) times daily. 2x daily   valACYclovir  (VALTREX ) 500 MG tablet Take 500 mg by mouth 2 (two) times daily as needed (outbreaks).    Vitamin D , Cholecalciferol , 25 MCG (1000 UT) CAPS Take 1 capsule by mouth daily.   Oregano Oil-Flaxseed Oil (OREGANO OIL IMMUNE SUPPORT) 50-25 MG CAPS  (Patient not taking: Reported on 09/04/2024)   Spacer/Aero-Holding Raguel (  AEROCHAMBER MV) inhaler Use as instructed (Patient not taking: Reported on 09/04/2024)   sucralfate  (CARAFATE ) 1 g tablet Take 1 tablet (1 g total) by mouth 4 (four) times daily -  with meals and at bedtime. (Patient not taking: Reported on 09/04/2024)   No facility-administered encounter medications on file as of 09/04/2024.    Allergies as of 09/04/2024 - Review Complete 09/04/2024  Allergen Reaction Noted   Aspirin Other (See Comments), Nausea And Vomiting, and Nausea Only 04/18/2013   Ketorolac  Hives 08/31/2022   Ketorolac  tromethamine  Shortness Of Breath, Itching, and Other (See Comments) 08/14/2019   Penicillins Hives, Itching, Rash, and Other (See Comments) 03/19/1994   Codeine Other (See Comments) 07/17/2018   Ibuprofen  Other (See Comments) and Nausea And Vomiting 09/03/2016   Latex Rash and Other (See Comments) 03/07/2011   Omeprazole magnesium Other (See Comments) 12/25/2019   Pantoprazole  sodium Other (See Comments) 12/25/2019   Penicillamine Rash 12/26/2019   Pregabalin Other (See Comments) 12/25/2019   Phenazopyridine   08/21/2023   Pyridium  [phenazopyridine  hcl] Swelling 06/02/2022    Past Medical History:  Diagnosis Date   ADHD    Arthritis 2010   Asthma    Back pain    Chronic headaches    Chronic pain    Constipation    Edema of both lower extremities    Family history of breast cancer    Family history of prostate cancer    Gallstones 2018   GERD (gastroesophageal reflux disease)    High cholesterol    History of kidney stones    Hx: UTI (urinary tract infection)    Hypertension    IBS (irritable bowel syndrome)    Interstitial cystitis 2012   Joint pain    Kidney problem    Migraine    Occipital neuralgia    Osteoarthritis    PNA (pneumonia) 2018   Posttraumatic muscle contracture    Pre-diabetes    Spinal stenosis    Trigeminal neuralgia     Past Surgical History:  Procedure Laterality Date   ABDOMINAL HYSTERECTOMY     ANKLE SURGERY Left 05/2009   APPENDECTOMY     Benign Tumor Removal Right 01/26/2017   right upper arm by Dr. Dale Melia at [facility] [place]    BRAIN SURGERY     CARPAL TUNNEL RELEASE Right 2014   CHOLECYSTECTOMY N/A 12/24/2020   Procedure: LAPAROSCOPIC CHOLECYSTECTOMY;  Surgeon: Vernetta Berg, MD;  Location: [facility] OR;  Service: General;  Laterality: N/A;   COLONOSCOPY     CYSTOSCOPY W/ URETERAL STENT PLACEMENT Left 07/25/2019   Procedure: CYSTOSCOPY WITH RETROGRADE PYELOGRAM/URETERAL STENT PLACEMENT;  Surgeon: Matilda Senior, MD;  Location: [facility] ORS;  Service: Urology;  Laterality: Left;   CYSTOSCOPY W/ URETERAL STENT REMOVAL Left 08/15/2019   Procedure: CYSTOSCOPY WITH STENT REMOVAL;  Surgeon: Matilda Senior, MD;  Location: [facility];   Service: Urology;  Laterality: Left;   CYSTOSCOPY WITH RETROGRADE PYELOGRAM, URETEROSCOPY AND STENT PLACEMENT Left 08/15/2019   Procedure: CYSTOSCOPY WITH RETROGRADE PYELOGRAM, URETEROSCOPY;  Surgeon: Matilda Senior, MD;  Location: [facility];  Service: Urology;  Laterality: Left;  90 MINS   CYSTOSCOPY WITH RETROGRADE PYELOGRAM, URETEROSCOPY AND STENT PLACEMENT Right 09/12/2019   Procedure: CYSTOSCOPY WITH RIGHT RETROGRADE PYELOGRAM  URETEROSCOPY WITH HOLMIUM LASER STONE EXTRACTION AND STENT PLACEMENT;  Surgeon: Matilda Senior, MD;  Location: [facility];  Service: Urology;  Laterality: Right;   DILATION AND CURETTAGE OF UTERUS     HOLMIUM LASER APPLICATION Left 08/15/2019   Procedure: HOLMIUM LASER APPLICATION;  Surgeon: Matilda Senior, MD;  Location: [facility];  Service: Urology;  Laterality: Left;   JOINT REPLACEMENT     R knee   KIDNEY STONE SURGERY     neuro spine similator  01/2021   OVARIAN CYST REMOVAL  05/2010   right knee revision  2016   ROTATOR CUFF REPAIR Right 02/2019   SPINE SURGERY     x 3   TUBAL LIGATION      Family History  Problem Relation Age of Onset   Ulcers Mother 7       peptic ulcer   Irritable bowel syndrome Mother    High blood pressure Mother    Sudden death Mother    Depression Mother    Prostate cancer Father 6   Heart disease Father    Irritable bowel syndrome Father    High blood pressure Father    Breast cancer Sister 15       ER+/PR+/Her2- breast cancer   Alzheimer's disease Sister    Hypertension Sister    Kidney disease Sister    Celiac disease Sister    Thyroid  disease Sister    Breast cancer Paternal Grandmother    Diabetes Paternal Grandmother    Cancer Paternal Uncle        unknown cancers   Liver disease Neg Hx    Esophageal cancer Neg Hx    Colon cancer Neg Hx     Social History   Socioeconomic History   Marital status: Single    Spouse name: Not on file   Number of children: 1   Years of education: Not on file    Highest education level: Not on file  Occupational History   Occupation: retired   Occupation: consulting civil engineer  Tobacco Use   Smoking status: Never   Smokeless tobacco: Never  Vaping Use   Vaping status: Never Used  Substance and Sexual Activity   Alcohol use: Yes    Comment: socially   Drug use: No   Sexual activity: Yes    Birth control/protection: Surgical  Other Topics Concern   Not on file  Social History Narrative   Not on file   Social Drivers of Health   Tobacco Use: Low Risk (09/04/2024)   Patient History    Smoking Tobacco Use: Never    Smokeless Tobacco Use: Never    Passive Exposure: Not on file  Financial Resource Strain: Low Risk (04/12/2023)   Received from [facility]   Overall Financial Resource Strain (CARDIA)    Difficulty of Paying Living Expenses: Not very hard  Food Insecurity: No Food Insecurity (08/11/2024)   Epic    Worried About Radiation Protection Practitioner of Food in the Last Year: Never true    Ran Out of Food in the Last Year: Never true  Transportation Needs: No Transportation Needs (08/11/2024)   Epic    Lack of Transportation (Medical): No    Lack of Transportation (Non-Medical): No  Physical Activity: Insufficiently Active (04/12/2023)   Received from [facility]   Exercise Vital Sign    On average, how many days per week do you engage in moderate to strenuous exercise (like a brisk walk)?: 2 days    On average, how many minutes do you engage in exercise at this level?: 20 min  Stress: No Stress Concern Present (04/12/2023)   Received from [facility] - Occupational Stress Questionnaire    Feeling of Stress : Only a little  Social Connections: Unknown (  08/11/2024)   Social Connection and Isolation Panel    Frequency of Communication with Friends and Family: More than three times a week    Frequency of Social Gatherings with Friends and Family: Not on file    Attends Religious Services: Not on file    Active Member of  Clubs or Organizations: Not on file    Attends Banker Meetings: Not on file    Marital Status: Not on file  Intimate Partner Violence: Not At Risk (08/11/2024)   Epic    Fear of Current or Ex-Partner: No    Emotionally Abused: No    Physically Abused: No    Sexually Abused: No  Depression (PHQ2-9): Low Risk (12/05/2022)   Depression (PHQ2-9)    PHQ-2 Score: 2  Alcohol Screen: Low Risk (12/05/2022)   Alcohol Screen    Last Alcohol Screening Score (AUDIT): 1  Housing: Low Risk (08/11/2024)   Epic    Unable to Pay for Housing in the Last Year: No    Number of Times Moved in the Last Year: 0    Homeless in the Last Year: No  Utilities: Not At Risk (08/11/2024)   Epic    Threatened with loss of utilities: No  Health Literacy: Not on file      Review of systems: All other review of systems negative except as mentioned in the HPI.   Physical Exam: Vitals:   09/04/24 1409  BP: 120/84  Pulse: 62   Body mass index is 32.51 kg/m. Gen:      No acute distress HEENT:  sclera anicteric CV: s1s2 rrr, no murmur Lungs: B/l clear. Abd:      soft, non-tender; no palpable masses, no distension Ext:    No edema Neuro: alert and oriented x 3 Psych: normal mood and affect  Data Reviewed:  Reviewed labs, radiology imaging, old records and pertinent past GI work up    Assessment & Plan Epigastric pain due to suspected gastritis or peptic ulcer disease Chronic intermittent epigastric pain with recent exacerbation, partially responsive to sucralfate . Persistent symptoms warrant endoscopic evaluation. Risks of long-term sucralfate  use, specifically nephrotoxicity, discussed. Endoscopy scheduling accommodated to minimize disruption to other care. - Provided anticipatory guidance regarding dietary triggers and medication use. - Instructed to use sucralfate  only as needed, dissolving tablets in water  for faster effect, and to avoid daily use beyond 1-2 weeks due to risk of  nephrotoxicity. - Continued Dexilant  in the morning. - Added famotidine  20 mg at bedtime to be used consistently until endoscopy is completed. - Continued dicyclomine  as needed for abdominal discomfort. - Ordered upper endoscopy for further evaluation, scheduled for February 20th at 8:00 AM, with instructions to arrive fasting.  Constipation Stool consistency satisfactory, with frequency at least twice daily.  - Provided reassurance regarding current bowel habits and stool consistency. - Encouraged maintenance of current regimen given satisfactory results.     This visit required >30 minutes of patient care (this includes precharting, chart review, review of results, face-to-face time used for counseling as well as treatment plan and follow-up. The patient was provided an opportunity to ask questions and all were answered. The patient agreed with the plan and demonstrated an understanding of the instructions.  LOIS Wilkie Mcgee , MD    CC: Perri Starleen BROCKS, MD    "

## 2024-09-04 NOTE — Patient Instructions (Addendum)
 VISIT SUMMARY:  During your visit, we discussed your persistent epigastric pain and bowel habits. We reviewed your symptoms, medications, and planned further evaluation.  YOUR PLAN:  EPIGASTRIC PAIN: You have been experiencing persistent epigastric pain, which may be due to gastritis or peptic ulcer disease. Your pain has been partially responsive to sucralfate , but you have concerns about long-term use. -Use sucralfate  only as needed, dissolving tablets in water  for faster effect. Avoid daily use beyond 1-2 weeks due to risk of nephrotoxicity. -Continue taking Dexilant  in the morning. -Start taking famotidine  20 mg at bedtime consistently until your endoscopy is completed. -Continue using dicyclomine  as needed for abdominal discomfort. -An upper endoscopy has been scheduled for February 20th at 8:00 AM. Please arrive fasting.  CONSTIPATION: Your bowel habits and stool consistency are satisfactory. -Continue your current bowel regimen as it is working well for you.  Due to recent changes in healthcare laws, you may see the results of your imaging and laboratory studies on MyChart before your provider has had a chance to review them.  We understand that in some cases there may be results that are confusing or concerning to you. Not all laboratory results come back in the same time frame and the provider may be waiting for multiple results in order to interpret others.  Please give us  48 hours in order for your provider to thoroughly review all the results before contacting the office for clarification of your results.    I appreciate the  opportunity to care for you  Thank You   Kavitha Nandigam , MD

## 2024-09-05 ENCOUNTER — Encounter: Payer: Self-pay | Admitting: Gastroenterology

## 2024-09-06 ENCOUNTER — Ambulatory Visit: Admitting: Neurology

## 2024-09-06 VITALS — BP 119/86 | HR 71 | Ht 62.0 in | Wt 182.0 lb

## 2024-09-06 DIAGNOSIS — M5481 Occipital neuralgia: Secondary | ICD-10-CM

## 2024-09-06 DIAGNOSIS — G500 Trigeminal neuralgia: Secondary | ICD-10-CM | POA: Diagnosis not present

## 2024-09-06 DIAGNOSIS — G43709 Chronic migraine without aura, not intractable, without status migrainosus: Secondary | ICD-10-CM | POA: Diagnosis not present

## 2024-09-06 NOTE — Progress Notes (Signed)
 "  Chief Complaint  Patient presents with   Tremors    Rm 14 alone  Pt is well, reports ongoing R hand tremor and occasional weakness since Aug 2025. She mentions medial nerve tumor removal in 2018.      ASSESSMENT AND PLAN  Cassidy Olsen is a 64 y.o. female   Intermittent right hand tremor  Thyroid  functional test  No parkinsonian features,  She reported clear asymmetry, would like to proceed with further evaluation, MRI of the brain with without contrast  Discussed treatment option including beta-blocker, such as low-dose propranolol, she is already on polypharmacy, prefer not to start more medications  DIAGNOSTIC DATA (LABS, IMAGING, TESTING) - I reviewed patient records, labs, notes, testing and imaging myself where available.   MEDICAL HISTORY:  Cassidy Olsen is a 64 year old female, seen in request by her p[facility] Dr. Powell, Alvin C for new onset right hand tremor  History is obtained from the patient and review of electronic medical records. I personally reviewed pertinent available imaging films in PACS.   PMHx of  Chronic migraine Trigeminal neuralgia, right, taking Amytriptyline 50mg  at bedtime. S/p right posterior decompression surgery HTN Fibromyalgia,  Chronic low back pain Hx of lumbar decompression surgery x3, last was in 2014, Right CTS in 2014  She has been a patient of her chronic for chronic migraine, was seen by Dr. Ines in the past, over the past few years, receiving Botox  injection every 3 months by Amy, last injection was in December 2025 which has been helpful  She had a long history of right trigeminal neuralgia, had posterior decompression surgery at [place]  more than 20 years ago, still has occasional flareup, taking gabapentin , amitriptyline   She reports new onset right hand tremor since September 2025, only involving her right dominant hand, intermittent, noticed that when using spoon, writing,  I was able  to observing her right hand tremor, intermittent, large amplitude, some voluntary component, also has fine tremor, does not involving left hand  Lab in August 2025, LDL 101, A1C 5.6,    She was admitted to the hospital August 12, 2027 2026 for sepsis due to right leg cellulitis, right knee and leg pain, improved with antibiotic treatment, she complains of pain all over, especially right knee, dragging right leg some, ambulate  PHYSICAL EXAM:   Vitals:   09/06/24 1128  BP: 119/86  Pulse: 71  Weight: 182 lb (82.6 kg)  Height: 5' 2 (1.575 m)   Body mass index is 33.29 kg/m.  PHYSICAL EXAMNIATION:  Gen: NAD, conversant, well nourised, well groomed                     Cardiovascular: Regular rate rhythm, no peripheral edema, warm, nontender. Eyes: Conjunctivae clear without exudates or hemorrhage Neck: Supple, no carotid bruits. Pulmonary: Clear to auscultation bilaterally   NEUROLOGICAL EXAM:  MENTAL STATUS: Speech/cognition: Awake, alert, oriented to history taking and casual conversation, defensive, CRANIAL NERVES: CN II: Visual fields are full to confrontation. Pupils are round equal and briskly reactive to light. CN III, IV, VI: extraocular movement are normal. No ptosis. CN V: Facial sensation is intact to light touch CN VII: Face is symmetric with normal eye closure  CN VIII: Hearing is normal to causal conversation. CN IX, X: Phonation is normal. CN XI: Head turning and shoulder shrug are intact  MOTOR: Intermittent right hand tremor, fine action tremor, sometimes volunteer large amplitude shaking, does not involve left hand, normal  muscle strength, normal tone, no rigidity, bradykinesia   REFLEXES: Reflexes are 1 and symmetric at the biceps, triceps, knees, and ankles. Plantar responses are flexor.  SENSORY: Intact to light touch, pinprick and vibratory sensation are intact in fingers and toes.  COORDINATION: There is no trunk or limb dysmetria  noted.  GAIT/STANCE: Posture is normal. Gait is cautious dragging right leg some  REVIEW OF SYSTEMS:  Full 14 system review of systems performed and notable only for as above All other review of systems were negative.   ALLERGIES: Allergies[1]  HOME MEDICATIONS: Current Outpatient Medications  Medication Sig Dispense Refill   ACETAMINOPHEN  PO Take 1,300 mg by mouth 2 (two) times daily. (Patient taking differently: Take 1,300 mg by mouth as needed.)     albuterol  (VENTOLIN  HFA) 108 (90 Base) MCG/ACT inhaler Inhale 2 puffs into the lungs every 6 (six) hours as needed for wheezing or shortness of breath. 8 g 6   amitriptyline  (ELAVIL ) 50 MG tablet TAKE 1 TABLET BY MOUTH EVERYDAY AT BEDTIME 90 tablet 1   amLODipine  (NORVASC ) 2.5 MG tablet Take 1 tablet by mouth daily.     atorvastatin  (LIPITOR) 20 MG tablet Take 1 tablet (20 mg total) by mouth daily. 50 tablet 0   botulinum toxin Type A  (BOTOX ) 200 units injection INJECT 155 UNITS INTRAMUSCULARLY EVERY 12 WEEKS (GIVEN AT MD  OFFICE, DISCARD UNUSED) 1 each 1   celecoxib  (CELEBREX ) 200 MG capsule Take 200 mg by mouth 2 (two) times daily.     cetirizine (ZYRTEC) 10 MG tablet Take 10 mg by mouth daily.     dexlansoprazole  (DEXILANT ) 60 MG capsule Take 1 capsule (60 mg total) by mouth daily. 90 capsule 3   dicyclomine  (BENTYL ) 10 MG capsule Take 2 capsules (20 mg total) by mouth in the morning and at bedtime. PRN 360 capsule 3   ELDERBERRY PO ELDERBERRY IMMUNE SUPPORT     famotidine  (PEPCID ) 20 MG tablet Take 1 tablet (20 mg total) by mouth at bedtime. 30 tablet 3   fluticasone  (FLONASE ) 50 MCG/ACT nasal spray Place 2 sprays into both nostrils daily. 16 g 11   fluticasone -salmeterol (WIXELA INHUB) 250-50 MCG/ACT AEPB Inhale 1 puff into the lungs in the morning and at bedtime. 60 each 5   Fremanezumab -vfrm (AJOVY ) 225 MG/1.5ML SOAJ Inject 225 mg into the skin every 30 (thirty) days. 4.5 mL 3   furosemide  (LASIX ) 20 MG tablet Take 1 tablet (20 mg  total) by mouth as needed. 10 tablet 0   gabapentin  (NEURONTIN ) 300 MG capsule Take 3 capsules (900 mg total) by mouth 3 (three) times daily. 270 capsule 0   lidocaine  (FT PAIN RELIEF MAX STRENGTH) 4 % Place 1 patch onto the skin as needed. 20 patch 0   lisinopril  (ZESTRIL ) 20 MG tablet Take 20 mg by mouth 2 (two) times daily.     lubiprostone  (AMITIZA ) 24 MCG capsule TAKE 1 CAPSULE (24 MCG TOTAL) BY MOUTH 2 (TWO) TIMES DAILY WITH A MEAL. 180 capsule 1   montelukast  (SINGULAIR ) 10 MG tablet TAKE 1 TABLET BY MOUTH EVERYDAY AT BEDTIME 90 tablet 1   MOVANTIK  25 MG TABS tablet TAKE 1 TABLET (25 MG TOTAL) BY MOUTH DAILY. 30 tablet 3   Omega-3 Fatty Acids (OMEGA III EPA+DHA) 1000 MG CAPS Take by mouth.     ondansetron  (ZOFRAN ) 4 MG tablet Take 4 mg by mouth every 8 (eight) hours as needed.     Oregano Oil-Flaxseed Oil (OREGANO OIL IMMUNE SUPPORT) 50-25 MG CAPS  sucralfate  (CARAFATE ) 1 g tablet Take 1 tablet (1 g total) by mouth 4 (four) times daily -  with meals and at bedtime. (Patient taking differently: Take 1 g by mouth as needed.) 120 tablet 0   timolol  (TIMOPTIC ) 0.5 % ophthalmic solution 1-2 drops eyes at onset and 30 minutes after if needed. Can repeat in 2 hours for headache/migraine. (Patient taking differently: as needed. 1-2 drops eyes at onset and 30 minutes after if needed. Can repeat in 2 hours for headache/migraine.) 10 mL 12   traMADol  (ULTRAM ) 50 MG tablet Take 100 mg by mouth 3 (three) times daily. 2x daily     valACYclovir  (VALTREX ) 500 MG tablet Take 500 mg by mouth 2 (two) times daily as needed (outbreaks).  (Patient taking differently: Take 500 mg by mouth as needed (outbreaks).)     Vitamin D , Cholecalciferol , 25 MCG (1000 UT) CAPS Take 1 capsule by mouth daily.     No current facility-administered medications for this visit.    PAST MEDICAL HISTORY: Past Medical History:  Diagnosis Date   ADHD    Arthritis 2010   Asthma    Back pain    Chronic headaches    Chronic  pain    Constipation    Edema of both lower extremities    Family history of breast cancer    Family history of prostate cancer    Gallstones 2018   GERD (gastroesophageal reflux disease)    High cholesterol    History of kidney stones    Hx: UTI (urinary tract infection)    Hypertension    IBS (irritable bowel syndrome)    Interstitial cystitis 2012   Joint pain    Kidney problem    Migraine    Occipital neuralgia    Osteoarthritis    PNA (pneumonia) 2018   Posttraumatic muscle contracture    Pre-diabetes    Spinal stenosis    Trigeminal neuralgia     PAST SURGICAL HISTORY: Past Surgical History:  Procedure Laterality Date   ABDOMINAL HYSTERECTOMY     ANKLE SURGERY Left 05/2009   APPENDECTOMY     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 Dale Melia at [facility] [place]    BRAIN SURGERY     CARPAL TUNNEL RELEASE Right 2014   CHOLECYSTECTOMY N/A 12/24/2020   Procedure: LAPAROSCOPIC CHOLECYSTECTOMY;  Surgeon: Vernetta Berg, MD;  Location: [facility] OR;  Service: General;  Laterality: N/A;   COLONOSCOPY     CYSTOSCOPY W/ URETERAL STENT PLACEMENT Left 07/25/2019   Procedure: CYSTOSCOPY WITH RETROGRADE PYELOGRAM/URETERAL STENT PLACEMENT;  Surgeon: Matilda Senior, MD;  Location: [facility] ORS;  Service: Urology;  Laterality: Left;   CYSTOSCOPY W/ URETERAL STENT REMOVAL Left 08/15/2019   Procedure: CYSTOSCOPY WITH STENT REMOVAL;  Surgeon: Matilda Senior, MD;  Location: [facility];  Service: Urology;  Laterality: Left;   CYSTOSCOPY WITH RETROGRADE PYELOGRAM, URETEROSCOPY AND STENT PLACEMENT Left 08/15/2019   Procedure: CYSTOSCOPY WITH RETROGRADE PYELOGRAM, URETEROSCOPY;  Surgeon: Matilda Senior, MD;  Location: [facility];  Service: Urology;  Laterality: Left;  90 MINS   CYSTOSCOPY WITH RETROGRADE PYELOGRAM, URETEROSCOPY AND STENT PLACEMENT Right 09/12/2019   Procedure: CYSTOSCOPY WITH RIGHT RETROGRADE PYELOGRAM  URETEROSCOPY WITH HOLMIUM LASER STONE  EXTRACTION AND STENT PLACEMENT;  Surgeon: Matilda Senior, MD;  Location: [facility];  Service: Urology;  Laterality: Right;   DILATION AND CURETTAGE OF UTERUS     HOLMIUM LASER APPLICATION Left 08/15/2019   Procedure: HOLMIUM LASER APPLICATION;  Surgeon: Matilda Senior, MD;  Location: [facility];  Service: Urology;  Laterality: Left;   JOINT REPLACEMENT     R knee   KIDNEY STONE SURGERY     neuro spine similator  01/2021   OVARIAN CYST REMOVAL  05/2010   right knee revision  2016   ROTATOR CUFF REPAIR Right 02/2019   SPINE SURGERY     x 3   TUBAL LIGATION      FAMILY HISTORY: Family History  Problem Relation Age of Onset   Ulcers Mother 40       peptic ulcer   Irritable bowel syndrome Mother    High blood pressure Mother    Sudden death Mother    Depression Mother    Prostate cancer Father 59   Heart disease Father    Irritable bowel syndrome Father    High blood pressure Father    Breast cancer Sister 66       ER+/PR+/Her2- breast cancer   Alzheimer's disease Sister    Hypertension Sister    Kidney disease Sister    Celiac disease Sister    Thyroid  disease Sister    Breast cancer Paternal Grandmother    Diabetes Paternal Grandmother    Cancer Paternal Uncle        unknown cancers   Liver disease Neg Hx    Esophageal cancer Neg Hx    Colon cancer Neg Hx     SOCIAL HISTORY: Social History   Socioeconomic History   Marital status: Single    Spouse name: Not on file   Number of children: 1   Years of education: Not on file   Highest education level: Not on file  Occupational History   Occupation: retired   Occupation: consulting civil engineer  Tobacco Use   Smoking status: Never   Smokeless tobacco: Never  Vaping Use   Vaping status: Never Used  Substance and Sexual Activity   Alcohol use: Yes    Comment: socially   Drug use: No   Sexual activity: Yes    Birth control/protection: Surgical  Other Topics Concern   Not on file  Social History Narrative   Not on file    Social Drivers of Health   Tobacco Use: Low Risk (09/05/2024)   Patient History    Smoking Tobacco Use: Never    Smokeless Tobacco Use: Never    Passive Exposure: Not on file  Financial Resource Strain: Low Risk (04/12/2023)   Received from [facility]   Overall Financial Resource Strain (CARDIA)    Difficulty of Paying Living Expenses: Not very hard  Food Insecurity: No Food Insecurity (08/11/2024)   Epic    Worried About Radiation Protection Practitioner of Food in the Last Year: Never true    Ran Out of Food in the Last Year: Never true  Transportation Needs: No Transportation Needs (08/11/2024)   Epic    Lack of Transportation (Medical): No    Lack of Transportation (Non-Medical): No  Physical Activity: Insufficiently Active (04/12/2023)   Received from [facility]   Exercise Vital Sign    On average, how many days per week do you engage in moderate to strenuous exercise (like a brisk walk)?: 2 days    On average, how many minutes do you engage in exercise at this level?: 20 min  Stress: No Stress Concern Present (04/12/2023)   Received from [facility] - Occupational Stress Questionnaire    Feeling of Stress : Only a little  Social Connections: Unknown (08/11/2024)  Social Connection and Isolation Panel    Frequency of Communication with Friends and Family: More than three times a week    Frequency of Social Gatherings with Friends and Family: Not on file    Attends Religious Services: Not on file    Active Member of Clubs or Organizations: Not on file    Attends Banker Meetings: Not on file    Marital Status: Not on file  Intimate Partner Violence: Not At Risk (08/11/2024)   Epic    Fear of Current or Ex-Partner: No    Emotionally Abused: No    Physically Abused: No    Sexually Abused: No  Depression (PHQ2-9): Low Risk (12/05/2022)   Depression (PHQ2-9)    PHQ-2 Score: 2  Alcohol Screen: Low Risk (12/05/2022)   Alcohol Screen    Last  Alcohol Screening Score (AUDIT): 1  Housing: Low Risk (08/11/2024)   Epic    Unable to Pay for Housing in the Last Year: No    Number of Times Moved in the Last Year: 0    Homeless in the Last Year: No  Utilities: Not At Risk (08/11/2024)   Epic    Threatened with loss of utilities: No  Health Literacy: Not on file      Modena Callander, M.D. Ph.D.  [facility] [address] [place], [place] [address] Ph: [phone] Fax: [phone]  CC:  Perri Starleen BROCKS, MD [address] [place],  [place] [address]  Perri Starleen BROCKS, MD      [1]  Allergies Allergen Reactions   Aspirin Other (See Comments), Nausea And Vomiting and Nausea Only    Stomach upset Avoids due to IBS Due to ibs Stomach upset Due to ibs Avoids due to IBS   Ketorolac  Hives   Ketorolac  Tromethamine  Shortness Of Breath, Itching and Other (See Comments)    IM administration ONLY  REDNESS IM administration ONLY  REDNESS  SWELLING OF THE THROAT   ORAL TORADOL  CAUSED EXTREME REDNESS, ITCHINESS, DIFFICULTY BREATHING, AND FACIAL SWELLING. PATIENT ALSO DIAGNOSED WITH BELL'S PALSY.   Penicillins Hives, Itching, Rash and Other (See Comments)    Did it involve swelling of the face/tongue/throat, SOB, or low BP? No Did it involve sudden or severe rash/hives, skin peeling, or any reaction on the inside of your mouth or nose? Yes Did you need to seek medical attention at a hospital or doctor's office? No When did it last happen?      2000 If all above answers are NO, may proceed with cephalosporin use.  Vaginal rash Vaginal rash Did it involve swelling of the face/tongue/throat, SOB, or low BP? No Did it involve sudden or severe rash/hives, skin peeling, or any reaction on the inside of your mouth or nose? Yes Did you need to seek medical attention at a hospital or doctor's office? No When did it last happen?      2000 If all above answers are NO, may proceed with cephalosporin use. Vaginal  rash Vaginal rash   Codeine Other (See Comments)    Cough meds with codeine-have withdraw symptoms when done Cough meds with codeine-have withdraw symptoms when done Cough meds with codeine-have withdraw symptoms when done   Ibuprofen Other (See Comments) and Nausea And Vomiting    Stomach upset Avoids due to IBS Stomach upset Avoids due to IBS   Latex Rash and Other (See Comments)    Vaginal irritation Vaginal irritation Exacerbates genital herpes Exacerbates genital herpes   Omeprazole  Magnesium Other (See Comments)    Lack of therapeutic effect Lack of therapeutic effect   Pantoprazole  Sodium Other (See Comments)    Lack of therapeutic effect Lack of therapeutic effect   Penicillamine Rash   Pregabalin Other (See Comments)    Weight gain Weight gain   Phenazopyridine      Other Reaction(s): angioedema   Pyridium  [Phenazopyridine  Hcl] Swelling    Lip swelling and burning   "

## 2024-09-07 LAB — THYROID PANEL WITH TSH
Free Thyroxine Index: 1.4 (ref 1.2–4.9)
T3 Uptake Ratio: 21 % — ABNORMAL LOW (ref 24–39)
T4, Total: 6.5 ug/dL (ref 4.5–12.0)
TSH: 2.46 u[IU]/mL (ref 0.450–4.500)

## 2024-09-09 ENCOUNTER — Ambulatory Visit: Payer: Self-pay | Admitting: Neurology

## 2024-09-09 ENCOUNTER — Telehealth: Payer: Self-pay | Admitting: Neurology

## 2024-09-09 ENCOUNTER — Ambulatory Visit: Admitting: Cardiovascular Disease

## 2024-09-09 NOTE — Telephone Encounter (Signed)
MRI order sent to [place] [phone]

## 2024-09-11 ENCOUNTER — Encounter: Payer: Self-pay | Admitting: Cardiovascular Disease

## 2024-09-12 NOTE — Telephone Encounter (Signed)
 I spoke with the pt and scheduled her for 10/07/24 with Dr. Annella to have risk assessment.

## 2024-09-27 ENCOUNTER — Encounter: Admitting: Gastroenterology

## 2024-10-07 ENCOUNTER — Ambulatory Visit: Admitting: Pulmonary Disease

## 2024-10-07 ENCOUNTER — Ambulatory Visit: Admitting: Adult Health

## 2024-11-05 ENCOUNTER — Ambulatory Visit: Admitting: Neurology

## 2025-01-28 ENCOUNTER — Ambulatory Visit: Admitting: Pulmonary Disease
